# Patient Record
Sex: Female | Born: 1950
Health system: Southern US, Community
[De-identification: ages and names within clinical notes are randomized; demographics above are authoritative.]

## PROBLEM LIST (undated history)

## (undated) ENCOUNTER — Emergency Department: Payer: Medicare HMO

## (undated) DIAGNOSIS — I10 Essential (primary) hypertension: Secondary | ICD-10-CM

## (undated) DIAGNOSIS — M1990 Unspecified osteoarthritis, unspecified site: Secondary | ICD-10-CM

## (undated) DIAGNOSIS — I209 Angina pectoris, unspecified: Secondary | ICD-10-CM

## (undated) DIAGNOSIS — Z8719 Personal history of other diseases of the digestive system: Secondary | ICD-10-CM

## (undated) DIAGNOSIS — E669 Obesity, unspecified: Secondary | ICD-10-CM

## (undated) DIAGNOSIS — R7303 Prediabetes: Secondary | ICD-10-CM

## (undated) DIAGNOSIS — G4730 Sleep apnea, unspecified: Secondary | ICD-10-CM

## (undated) DIAGNOSIS — D649 Anemia, unspecified: Secondary | ICD-10-CM

## (undated) DIAGNOSIS — K219 Gastro-esophageal reflux disease without esophagitis: Secondary | ICD-10-CM

## (undated) DIAGNOSIS — I739 Peripheral vascular disease, unspecified: Secondary | ICD-10-CM

## (undated) DIAGNOSIS — K635 Polyp of colon: Secondary | ICD-10-CM

## (undated) DIAGNOSIS — F32A Depression, unspecified: Secondary | ICD-10-CM

## (undated) DIAGNOSIS — Z8614 Personal history of Methicillin resistant Staphylococcus aureus infection: Secondary | ICD-10-CM

## (undated) DIAGNOSIS — IMO0001 Reserved for inherently not codable concepts without codable children: Secondary | ICD-10-CM

## (undated) DIAGNOSIS — K449 Diaphragmatic hernia without obstruction or gangrene: Secondary | ICD-10-CM

## (undated) DIAGNOSIS — G894 Chronic pain syndrome: Secondary | ICD-10-CM

## (undated) HISTORY — PX: CHOLECYSTECTOMY: SHX55

## (undated) HISTORY — DX: Polyp of colon: K63.5

## (undated) HISTORY — PX: COLONOSCOPY W/ POLYPECTOMY: SHX1380

## (undated) HISTORY — DX: Unspecified osteoarthritis, unspecified site: M19.90

## (undated) HISTORY — PX: JOINT REPLACEMENT: SHX530

## (undated) HISTORY — PX: BREAST SURGERY: SHX581

## (undated) HISTORY — DX: Essential (primary) hypertension: I10

## (undated) HISTORY — PX: CARPAL TUNNEL RELEASE: SHX101

## (undated) HISTORY — PX: ABDOMINAL HYSTERECTOMY: SHX81

## (undated) HISTORY — PX: TRIGGER FINGER RELEASE: SHX641

---

## 2004-09-15 ENCOUNTER — Emergency Department: Payer: Self-pay | Admitting: Emergency Medicine

## 2004-10-01 ENCOUNTER — Emergency Department: Payer: Self-pay | Admitting: Emergency Medicine

## 2005-01-13 ENCOUNTER — Emergency Department: Payer: Self-pay | Admitting: Emergency Medicine

## 2005-10-19 ENCOUNTER — Emergency Department: Payer: Self-pay | Admitting: Emergency Medicine

## 2006-03-21 DIAGNOSIS — D126 Benign neoplasm of colon, unspecified: Secondary | ICD-10-CM | POA: Insufficient documentation

## 2006-09-02 ENCOUNTER — Emergency Department: Payer: Self-pay | Admitting: Emergency Medicine

## 2006-09-02 ENCOUNTER — Other Ambulatory Visit: Payer: Self-pay

## 2006-09-23 ENCOUNTER — Ambulatory Visit: Payer: Self-pay

## 2006-10-03 ENCOUNTER — Ambulatory Visit: Payer: Self-pay | Admitting: Gastroenterology

## 2007-05-23 ENCOUNTER — Emergency Department: Payer: Self-pay | Admitting: Emergency Medicine

## 2007-05-23 ENCOUNTER — Other Ambulatory Visit: Payer: Self-pay

## 2007-05-27 ENCOUNTER — Ambulatory Visit: Payer: Self-pay | Admitting: Emergency Medicine

## 2007-09-07 ENCOUNTER — Encounter: Payer: Self-pay | Admitting: Neurosurgery

## 2007-09-14 ENCOUNTER — Encounter: Payer: Self-pay | Admitting: Neurosurgery

## 2007-10-15 ENCOUNTER — Encounter: Payer: Self-pay | Admitting: Neurosurgery

## 2007-11-15 ENCOUNTER — Encounter: Payer: Self-pay | Admitting: Neurosurgery

## 2008-03-01 ENCOUNTER — Ambulatory Visit: Payer: Self-pay

## 2008-03-03 ENCOUNTER — Ambulatory Visit: Payer: Self-pay | Admitting: Unknown Physician Specialty

## 2010-12-20 ENCOUNTER — Observation Stay: Payer: Self-pay | Admitting: Internal Medicine

## 2011-01-03 ENCOUNTER — Emergency Department: Payer: Self-pay | Admitting: Emergency Medicine

## 2011-01-28 ENCOUNTER — Ambulatory Visit: Payer: Self-pay | Admitting: Unknown Physician Specialty

## 2011-01-30 LAB — PATHOLOGY REPORT

## 2011-05-27 ENCOUNTER — Ambulatory Visit: Payer: Self-pay | Admitting: Unknown Physician Specialty

## 2011-05-27 DIAGNOSIS — I10 Essential (primary) hypertension: Secondary | ICD-10-CM

## 2011-06-06 ENCOUNTER — Inpatient Hospital Stay: Payer: Self-pay | Admitting: Unknown Physician Specialty

## 2011-06-11 ENCOUNTER — Ambulatory Visit: Payer: Self-pay | Admitting: Surgery

## 2011-09-13 ENCOUNTER — Emergency Department: Payer: Self-pay | Admitting: Internal Medicine

## 2011-12-08 ENCOUNTER — Emergency Department: Payer: Self-pay | Admitting: Emergency Medicine

## 2012-01-03 ENCOUNTER — Ambulatory Visit: Payer: Self-pay | Admitting: Anesthesiology

## 2012-01-03 LAB — BASIC METABOLIC PANEL
Anion Gap: 8 (ref 7–16)
Calcium, Total: 9.1 mg/dL (ref 8.5–10.1)
Chloride: 103 mmol/L (ref 98–107)
Co2: 30 mmol/L (ref 21–32)
Osmolality: 279 (ref 275–301)
Potassium: 3.3 mmol/L — ABNORMAL LOW (ref 3.5–5.1)

## 2012-01-08 ENCOUNTER — Ambulatory Visit: Payer: Self-pay | Admitting: Unknown Physician Specialty

## 2012-01-08 LAB — POTASSIUM: Potassium: 3.8 mmol/L (ref 3.5–5.1)

## 2012-01-31 ENCOUNTER — Ambulatory Visit: Payer: 59 | Admitting: Internal Medicine

## 2012-03-02 ENCOUNTER — Ambulatory Visit: Payer: 59 | Admitting: Internal Medicine

## 2012-04-20 ENCOUNTER — Ambulatory Visit: Payer: Self-pay | Admitting: Unknown Physician Specialty

## 2012-04-27 ENCOUNTER — Ambulatory Visit: Payer: Self-pay | Admitting: Unknown Physician Specialty

## 2012-05-25 ENCOUNTER — Ambulatory Visit: Payer: Self-pay | Admitting: Surgery

## 2012-05-25 LAB — POTASSIUM: Potassium: 4.2 mmol/L

## 2012-10-12 ENCOUNTER — Ambulatory Visit: Payer: Self-pay | Admitting: Family Medicine

## 2012-12-09 ENCOUNTER — Emergency Department: Payer: Self-pay | Admitting: Emergency Medicine

## 2012-12-09 LAB — URINALYSIS, COMPLETE
Bacteria: NONE SEEN
Bilirubin,UR: NEGATIVE
Blood: NEGATIVE
Glucose,UR: NEGATIVE mg/dL (ref 0–75)
Ketone: NEGATIVE
Ph: 6 (ref 4.5–8.0)
RBC,UR: 1 /HPF (ref 0–5)
Specific Gravity: 1.008 (ref 1.003–1.030)

## 2013-02-24 ENCOUNTER — Emergency Department: Payer: Self-pay | Admitting: Internal Medicine

## 2013-02-24 LAB — URINALYSIS, COMPLETE
Bilirubin,UR: NEGATIVE
Blood: NEGATIVE
Leukocyte Esterase: NEGATIVE
Nitrite: NEGATIVE
Specific Gravity: 1.026 (ref 1.003–1.030)
WBC UR: 1 /HPF (ref 0–5)

## 2013-02-24 LAB — COMPREHENSIVE METABOLIC PANEL
Albumin: 3.6 g/dL (ref 3.4–5.0)
Alkaline Phosphatase: 121 U/L (ref 50–136)
BUN: 16 mg/dL (ref 7–18)
Bilirubin,Total: 0.3 mg/dL (ref 0.2–1.0)
Calcium, Total: 9.7 mg/dL (ref 8.5–10.1)
Chloride: 108 mmol/L — ABNORMAL HIGH (ref 98–107)
Co2: 29 mmol/L (ref 21–32)
EGFR (Non-African Amer.): 43 — ABNORMAL LOW
Osmolality: 285 (ref 275–301)
Potassium: 3.6 mmol/L (ref 3.5–5.1)
SGPT (ALT): 13 U/L (ref 12–78)
Total Protein: 7.2 g/dL (ref 6.4–8.2)

## 2013-02-24 LAB — LIPASE, BLOOD: Lipase: 100 U/L (ref 73–393)

## 2013-02-24 LAB — CBC
HGB: 11.9 g/dL — ABNORMAL LOW (ref 12.0–16.0)
MCH: 26.8 pg (ref 26.0–34.0)
Platelet: 225 10*3/uL (ref 150–440)
RDW: 14.4 % (ref 11.5–14.5)
WBC: 6.3 10*3/uL (ref 3.6–11.0)

## 2013-02-27 ENCOUNTER — Emergency Department: Payer: Self-pay | Admitting: Emergency Medicine

## 2013-03-17 ENCOUNTER — Ambulatory Visit: Payer: Self-pay | Admitting: Family Medicine

## 2013-04-12 ENCOUNTER — Ambulatory Visit: Payer: Self-pay | Admitting: Family Medicine

## 2013-06-21 ENCOUNTER — Ambulatory Visit: Payer: Self-pay | Admitting: Anesthesiology

## 2013-06-22 ENCOUNTER — Ambulatory Visit: Payer: Self-pay | Admitting: Anesthesiology

## 2013-06-25 ENCOUNTER — Encounter: Payer: Self-pay | Admitting: Anesthesiology

## 2013-07-27 ENCOUNTER — Ambulatory Visit: Payer: Self-pay | Admitting: Anesthesiology

## 2013-08-25 ENCOUNTER — Ambulatory Visit: Payer: Self-pay | Admitting: Anesthesiology

## 2013-09-20 ENCOUNTER — Emergency Department: Payer: Self-pay | Admitting: Emergency Medicine

## 2013-09-20 DIAGNOSIS — M1710 Unilateral primary osteoarthritis, unspecified knee: Secondary | ICD-10-CM | POA: Insufficient documentation

## 2013-09-20 DIAGNOSIS — K219 Gastro-esophageal reflux disease without esophagitis: Secondary | ICD-10-CM | POA: Insufficient documentation

## 2013-09-20 DIAGNOSIS — M179 Osteoarthritis of knee, unspecified: Secondary | ICD-10-CM | POA: Insufficient documentation

## 2013-09-20 DIAGNOSIS — G8929 Other chronic pain: Secondary | ICD-10-CM | POA: Insufficient documentation

## 2013-09-20 DIAGNOSIS — I10 Essential (primary) hypertension: Secondary | ICD-10-CM | POA: Insufficient documentation

## 2013-09-23 ENCOUNTER — Ambulatory Visit: Payer: Self-pay | Admitting: Anesthesiology

## 2013-10-21 ENCOUNTER — Emergency Department: Payer: Self-pay | Admitting: Emergency Medicine

## 2013-10-21 LAB — BASIC METABOLIC PANEL
Anion Gap: 4 — ABNORMAL LOW (ref 7–16)
BUN: 12 mg/dL (ref 7–18)
CHLORIDE: 103 mmol/L (ref 98–107)
Calcium, Total: 9.3 mg/dL (ref 8.5–10.1)
Co2: 32 mmol/L (ref 21–32)
Creatinine: 1.17 mg/dL (ref 0.60–1.30)
EGFR (African American): 58 — ABNORMAL LOW
GFR CALC NON AF AMER: 50 — AB
Glucose: 86 mg/dL (ref 65–99)
Osmolality: 277 (ref 275–301)
Potassium: 2.9 mmol/L — ABNORMAL LOW (ref 3.5–5.1)
SODIUM: 139 mmol/L (ref 136–145)

## 2013-10-21 LAB — CBC WITH DIFFERENTIAL/PLATELET
Basophil #: 0.1 10*3/uL (ref 0.0–0.1)
Basophil %: 1.1 %
Eosinophil #: 0.2 10*3/uL (ref 0.0–0.7)
Eosinophil %: 3.2 %
HCT: 35.3 % (ref 35.0–47.0)
HGB: 11.8 g/dL — AB (ref 12.0–16.0)
LYMPHS PCT: 40.5 %
Lymphocyte #: 2.1 10*3/uL (ref 1.0–3.6)
MCH: 27.8 pg (ref 26.0–34.0)
MCHC: 33.5 g/dL (ref 32.0–36.0)
MCV: 83 fL (ref 80–100)
MONOS PCT: 7.5 %
Monocyte #: 0.4 x10 3/mm (ref 0.2–0.9)
NEUTROS PCT: 47.7 %
Neutrophil #: 2.4 10*3/uL (ref 1.4–6.5)
Platelet: 284 10*3/uL (ref 150–440)
RBC: 4.25 10*6/uL (ref 3.80–5.20)
RDW: 15 % — AB (ref 11.5–14.5)
WBC: 5.1 10*3/uL (ref 3.6–11.0)

## 2013-10-21 LAB — URINALYSIS, COMPLETE
BACTERIA: NONE SEEN
BILIRUBIN, UR: NEGATIVE
Blood: NEGATIVE
Glucose,UR: NEGATIVE mg/dL (ref 0–75)
Ketone: NEGATIVE
LEUKOCYTE ESTERASE: NEGATIVE
Nitrite: NEGATIVE
Ph: 5 (ref 4.5–8.0)
Protein: NEGATIVE
RBC,UR: NONE SEEN /HPF (ref 0–5)
Specific Gravity: 1.015 (ref 1.003–1.030)

## 2013-10-21 LAB — TROPONIN I: Troponin-I: <0.02 ng/mL

## 2014-04-06 ENCOUNTER — Ambulatory Visit: Payer: Self-pay | Admitting: Pain Medicine

## 2014-04-06 ENCOUNTER — Other Ambulatory Visit: Payer: Self-pay | Admitting: Pain Medicine

## 2014-04-06 LAB — BASIC METABOLIC PANEL
Anion Gap: 4 — ABNORMAL LOW (ref 7–16)
BUN: 15 mg/dL (ref 7–18)
Calcium, Total: 9.7 mg/dL (ref 8.5–10.1)
Chloride: 105 mmol/L (ref 98–107)
Co2: 32 mmol/L (ref 21–32)
Creatinine: 0.86 mg/dL (ref 0.60–1.30)
EGFR (African American): >60
EGFR (Non-African Amer.): >60
GLUCOSE: 81 mg/dL (ref 65–99)
Osmolality: 281 (ref 275–301)
Potassium: 3.4 mmol/L — ABNORMAL LOW (ref 3.5–5.1)
SODIUM: 141 mmol/L (ref 136–145)

## 2014-04-06 LAB — HEPATIC FUNCTION PANEL A (ARMC)
ALK PHOS: 113 U/L
Albumin: 3.9 g/dL (ref 3.4–5.0)
BILIRUBIN DIRECT: 0.1 mg/dL (ref 0.00–0.20)
BILIRUBIN TOTAL: 0.4 mg/dL (ref 0.2–1.0)
SGOT(AST): 10 U/L — ABNORMAL LOW (ref 15–37)
SGPT (ALT): 12 U/L (ref 12–78)
Total Protein: 8.1 g/dL (ref 6.4–8.2)

## 2014-04-06 LAB — SEDIMENTATION RATE: Erythrocyte Sed Rate: 42 mm/hr — ABNORMAL HIGH (ref 0–30)

## 2014-04-06 LAB — MAGNESIUM: Magnesium: 1.9 mg/dL

## 2014-04-12 ENCOUNTER — Ambulatory Visit: Payer: Self-pay | Admitting: Pain Medicine

## 2014-05-02 ENCOUNTER — Ambulatory Visit: Payer: Self-pay | Admitting: Pain Medicine

## 2014-05-03 ENCOUNTER — Ambulatory Visit: Payer: Self-pay | Admitting: Pain Medicine

## 2014-05-16 ENCOUNTER — Emergency Department: Payer: Self-pay | Admitting: Emergency Medicine

## 2014-05-16 LAB — TROPONIN I

## 2014-05-23 ENCOUNTER — Encounter: Payer: Self-pay | Admitting: Pain Medicine

## 2014-06-09 ENCOUNTER — Inpatient Hospital Stay: Payer: Self-pay | Admitting: Internal Medicine

## 2014-06-09 LAB — SEDIMENTATION RATE: Erythrocyte Sed Rate: 31 mm/hr — ABNORMAL HIGH (ref 0–30)

## 2014-06-09 LAB — TROPONIN I
Troponin-I: <0.02 ng/mL
Troponin-I: <0.02 ng/mL

## 2014-06-09 LAB — COMPREHENSIVE METABOLIC PANEL
ALBUMIN: 3.2 g/dL — AB (ref 3.4–5.0)
ALK PHOS: 101 U/L
ANION GAP: 7 (ref 7–16)
BILIRUBIN TOTAL: 0.3 mg/dL (ref 0.2–1.0)
BUN: 21 mg/dL — ABNORMAL HIGH (ref 7–18)
Calcium, Total: 9.2 mg/dL (ref 8.5–10.1)
Chloride: 107 mmol/L (ref 98–107)
Co2: 30 mmol/L (ref 21–32)
Creatinine: 1.02 mg/dL (ref 0.60–1.30)
EGFR (African American): >60
EGFR (Non-African Amer.): 59 — ABNORMAL LOW
GLUCOSE: 81 mg/dL (ref 65–99)
Osmolality: 289 (ref 275–301)
POTASSIUM: 3.4 mmol/L — AB (ref 3.5–5.1)
SGOT(AST): 16 U/L (ref 15–37)
SGPT (ALT): 13 U/L — ABNORMAL LOW
Sodium: 144 mmol/L (ref 136–145)
Total Protein: 7.3 g/dL (ref 6.4–8.2)

## 2014-06-09 LAB — URINALYSIS, COMPLETE
BACTERIA: NONE SEEN
BILIRUBIN, UR: NEGATIVE
BLOOD: NEGATIVE
Glucose,UR: NEGATIVE mg/dL (ref 0–75)
Ketone: NEGATIVE
NITRITE: NEGATIVE
Ph: 5 (ref 4.5–8.0)
Protein: NEGATIVE
RBC,UR: 3 /HPF (ref 0–5)
SPECIFIC GRAVITY: 1.023 (ref 1.003–1.030)
Squamous Epithelial: 2
WBC UR: 7 /HPF (ref 0–5)

## 2014-06-09 LAB — CBC WITH DIFFERENTIAL/PLATELET
Basophil #: 0.1 10*3/uL (ref 0.0–0.1)
Basophil %: 1.4 %
EOS PCT: 1.7 %
Eosinophil #: 0.1 10*3/uL (ref 0.0–0.7)
HCT: 38.5 % (ref 35.0–47.0)
HGB: 12 g/dL (ref 12.0–16.0)
Lymphocyte #: 2 10*3/uL (ref 1.0–3.6)
Lymphocyte %: 35.3 %
MCH: 26.9 pg (ref 26.0–34.0)
MCHC: 31 g/dL — AB (ref 32.0–36.0)
MCV: 87 fL (ref 80–100)
MONO ABS: 0.5 x10 3/mm (ref 0.2–0.9)
Monocyte %: 8.8 %
NEUTROS PCT: 52.8 %
Neutrophil #: 3 10*3/uL (ref 1.4–6.5)
Platelet: 248 10*3/uL (ref 150–440)
RBC: 4.45 10*6/uL (ref 3.80–5.20)
RDW: 15.1 % — ABNORMAL HIGH (ref 11.5–14.5)
WBC: 5.6 10*3/uL (ref 3.6–11.0)

## 2014-06-09 LAB — HEMOGLOBIN A1C: HEMOGLOBIN A1C: 5.4 % (ref 4.2–6.3)

## 2014-06-09 LAB — TSH: Thyroid Stimulating Horm: 0.748 u[IU]/mL

## 2014-06-09 LAB — MAGNESIUM: Magnesium: 2.1 mg/dL

## 2014-06-10 DIAGNOSIS — I359 Nonrheumatic aortic valve disorder, unspecified: Secondary | ICD-10-CM

## 2014-06-10 LAB — CBC WITH DIFFERENTIAL/PLATELET
BASOS ABS: 0 10*3/uL (ref 0.0–0.1)
BASOS PCT: 1 %
EOS PCT: 2.1 %
Eosinophil #: 0.1 10*3/uL (ref 0.0–0.7)
HCT: 33.5 % — AB (ref 35.0–47.0)
HGB: 11.1 g/dL — ABNORMAL LOW (ref 12.0–16.0)
LYMPHS ABS: 1.7 10*3/uL (ref 1.0–3.6)
LYMPHS PCT: 37.9 %
MCH: 28.2 pg (ref 26.0–34.0)
MCHC: 33 g/dL (ref 32.0–36.0)
MCV: 86 fL (ref 80–100)
MONOS PCT: 9.3 %
Monocyte #: 0.4 x10 3/mm (ref 0.2–0.9)
NEUTROS ABS: 2.2 10*3/uL (ref 1.4–6.5)
Neutrophil %: 49.7 %
Platelet: 219 10*3/uL (ref 150–440)
RBC: 3.91 10*6/uL (ref 3.80–5.20)
RDW: 14.9 % — ABNORMAL HIGH (ref 11.5–14.5)
WBC: 4.5 10*3/uL (ref 3.6–11.0)

## 2014-06-10 LAB — COMPREHENSIVE METABOLIC PANEL
ALT: 9 U/L — AB
ANION GAP: 5 — AB (ref 7–16)
AST: 15 U/L (ref 15–37)
Albumin: 2.6 g/dL — ABNORMAL LOW (ref 3.4–5.0)
Alkaline Phosphatase: 79 U/L
BUN: 20 mg/dL — ABNORMAL HIGH (ref 7–18)
Bilirubin,Total: 0.2 mg/dL (ref 0.2–1.0)
CALCIUM: 8.4 mg/dL — AB (ref 8.5–10.1)
CHLORIDE: 114 mmol/L — AB (ref 98–107)
CO2: 28 mmol/L (ref 21–32)
Creatinine: 0.92 mg/dL (ref 0.60–1.30)
EGFR (African American): >60
Glucose: 95 mg/dL (ref 65–99)
Osmolality: 295 (ref 275–301)
POTASSIUM: 4 mmol/L (ref 3.5–5.1)
Sodium: 147 mmol/L — ABNORMAL HIGH (ref 136–145)
Total Protein: 5.9 g/dL — ABNORMAL LOW (ref 6.4–8.2)

## 2014-06-10 LAB — LIPID PANEL
Cholesterol: 135 mg/dL (ref 0–200)
HDL Cholesterol: 36 mg/dL — ABNORMAL LOW (ref 40–60)
LDL CHOLESTEROL, CALC: 86 mg/dL (ref 0–100)
Triglycerides: 63 mg/dL (ref 0–200)
VLDL CHOLESTEROL, CALC: 13 mg/dL (ref 5–40)

## 2014-06-12 LAB — URINE CULTURE

## 2014-07-05 DIAGNOSIS — M1990 Unspecified osteoarthritis, unspecified site: Secondary | ICD-10-CM | POA: Insufficient documentation

## 2014-07-16 ENCOUNTER — Ambulatory Visit: Payer: Self-pay | Admitting: Neurology

## 2014-08-02 DIAGNOSIS — Q283 Other malformations of cerebral vessels: Secondary | ICD-10-CM | POA: Insufficient documentation

## 2014-08-03 DIAGNOSIS — M5030 Other cervical disc degeneration, unspecified cervical region: Secondary | ICD-10-CM | POA: Insufficient documentation

## 2014-08-03 DIAGNOSIS — M542 Cervicalgia: Secondary | ICD-10-CM | POA: Insufficient documentation

## 2014-08-03 DIAGNOSIS — M5136 Other intervertebral disc degeneration, lumbar region: Secondary | ICD-10-CM | POA: Insufficient documentation

## 2014-08-03 DIAGNOSIS — M310 Hypersensitivity angiitis: Secondary | ICD-10-CM | POA: Insufficient documentation

## 2015-01-13 ENCOUNTER — Ambulatory Visit
Admit: 2015-01-13 | Disposition: A | Discharge status: Home / Self Care | Payer: Self-pay | Attending: Unknown Physician Specialty | Admitting: Unknown Physician Specialty

## 2015-01-30 ENCOUNTER — Emergency Department: Admit: 2015-01-30 | Disposition: A | Discharge status: Home / Self Care | Payer: Self-pay | Admitting: Student

## 2015-01-31 NOTE — Op Note (Signed)
PATIENT NAME:  Jacqueline Delgado, Jacqueline Delgado MR#:  [id number] DATE OF BIRTH:  15-Apr-1951  DATE OF PROCEDURE:  05/25/2012  PREOPERATIVE DIAGNOSIS: Chronic cholecystitis, cholelithiasis.   POSTOPERATIVE DIAGNOSIS: Chronic cholecystitis, cholelithiasis.   PROCEDURE: Laparoscopic cholecystectomy.   SURGEON: Rochel Brome, M.D.   ANESTHESIA: General.   INDICATIONS: This 64 year old female has a history of epigastric pain and ultrasound findings of gallstones. Surgery was recommended for definitive treatment.   DESCRIPTION OF PROCEDURE: The patient was placed on the operating table in the supine position under general endotracheal anesthesia. It is noted she did have a preop dose of Invanz due to her history of total knee replacement. The abdomen was prepared with ChloraPrep and draped in a sterile manner.   An incision was made deep in the inferior aspect of the umbilicus and carried down to the deep fascia, which was grasped with a laryngeal hook and elevated. A Veress needle was inserted, aspirated, and irrigated with a saline solution. Next, the peritoneal cavity was inflated with carbon dioxide. The Veress needle was removed. The 10-mm cannula was inserted. The 10-mm, 0-degree laparoscope was inserted to view the peritoneal cavity. There were some adhesions between the omentum and the anterior abdominal wall, and at this point could not see the right lobe of the liver and could not see the gallbladder. The scope was manipulated briefly, but still could not see the right upper quadrant. Another short incision was made in the left mid abdomen to introduce a 5-mm cannula. A scissors was introduced and did some blunt and sharp dissection, separating a portion of omentum from the falciform ligament and from the anterior abdominal wall. We made a small window for the laparoscope to pass through into the right upper quadrant. That 5-mm port was not needed after the dissection. Next, another incision was made in the  epigastrium just to the right of the midline to introduce an 11-mm cannula. Two incisions were made in the lateral aspect of the right upper quadrant to introduce two 5-mm cannulas.   The patient was placed in the reverse Trendelenburg position and turned some 5 degrees to the left. The gallbladder appeared to have a slightly thickened wall. The liver appeared smooth. The gallbladder was retracted towards the right shoulder. The infundibulum was retracted inferiorly and laterally. The porta hepatis was demonstrated. The neck of the gallbladder was mobilized with incision of the visceral peritoneum. The cystic duct was dissected free from surrounding structures. The cystic artery was dissected free from surrounding structures. A critical view of safety was demonstrated. An endoclip was placed across the cystic duct. An incision was made in the cystic duct. There was just a tiny bit of clear bile, which drained. A Reddick catheter was inserted; however, it would only thread in about 5 mm. It appeared that the cystic duct was very small in size and therefore a cholangiogram was not done. Next, the Reddick catheter was removed. The cystic duct was doubly ligated with endoclips and divided. The cystic artery was controlled with double endoclips and divided. The gallbladder was dissected free from the liver with hook and cautery. Bleeding was very scant. A few small bleeding points were cauterized. Hemostasis was intact. The gallbladder was delivered up through the infraumbilical incision, opened, suctioned, removed, and sent with palpable gallstone for routine pathology. The right upper quadrant was further inspected and hemostasis was intact. All the cannulas were removed, allowing carbon dioxide to escape from the peritoneal cavity. The skin incisions were closed with interrupted 5-0 chromic  subcuticular sutures, benzoin, and Steri-Strips. Dressings were applied with paper tape. The patient tolerated surgery  satisfactorily and was then prepared for transfer to the recovery room.   ____________________________ Lenna Sciara. Rochel Brome, MD [id number]:bjt D: 05/25/2012 09:04:18 ET T: 05/25/2012 12:39:39 ET JOB#: [id number]  cc: Loreli Dollar, MD, <Dictator> Loreli Dollar MD ELECTRONICALLY SIGNED 05/27/2012 12:34

## 2015-02-02 ENCOUNTER — Emergency Department
Admit: 2015-02-02 | Disposition: A | Discharge status: Home / Self Care | Payer: Self-pay | Admitting: Emergency Medicine

## 2015-02-03 NOTE — H&P (Signed)
PATIENT NAME:  Jacqueline Delgado, Jacqueline Delgado MR#:  [id number] DATE OF BIRTH:  [birth year]  DATE OF ADMISSION:  06/21/2013  CHIEF COMPLAINT: Persistent low back pain and right lateral leg numbness and numbness of  the right great toe.  PROCEDURE: None.   HISTORY OF PRESENT ILLNESS: Jacqueline Delgado is a pleasant 64 year old black female with long-standing history of low back pain that started about March of 2014. It is mainly centralized low back pain that has gradually gotten worse to the Jacqueline Delgado where she sought medical attention with a maximum VAS score of 10, best a 5. The pain does not appear to be influenced by time of day. There has been some mild increased intensity at night and when she gets out of bed in the morning. Aggravating factors include twisting of the low back as well as prolonged sitting and bending. She describes the pain as a right lateral leg numbness with pain in the low back that does not let her sleep at night. It is aching, annoying, constant, burning and uncomfortable. Previous MRI scan is available for review dated 04/12/2013 showing multilevel degenerative disk disease most notably at L3-L4, L4-L5 and L5-S1. At L3-L4 there is mild annular bulge with mild to moderate left neural foraminal encroachment with moderate narrowing of the right neural foramen and facet hypertrophy bilaterally. This is present at L4-L5 as well and at L5-S1 there is an annular disk bulge with mild encroachment of the right neural foramen.   PAST MEDICAL HISTORY: Significant for hypertension, sleep apnea, hiatal hernia, reflux and osteoarthritis.   SOCIAL HISTORY: She is widowed. Has 4 children. Does not smoke. Does not drink alcohol.   WORK HISTORY: Full-time work at [employer] and part-time work at [employer] a [employer].   PAST SURGICAL HISTORY: Bilateral knee replacement and cholecystectomy.   ALLERGIES: IBUPROFEN.   CURRENT MEDICATIONS: Include AcipHex, KCL, torsemide, zolpidem.   PHYSICAL EXAMINATION:  Reveals a pleasant black female in no acute distress. She is alert and oriented x 3, cooperative and compliant. VAS is 0/10. Temperature is 98, blood pressure 124/74, pulse 67 and regular, respirations 14, and O2 sat 98%. Heart has regular rate and rhythm. Lungs are clear to auscultation bilaterally without wheeze or rhonchi. Inspection of the low back reveals some paraspinous muscle tenderness primarily at L5, bilateral over the SI joints. She has pain on extension with pain on left lateral rotation in the standing position. Not so much with right lateral rotation. In the supine position, she has a positive straight leg raise at approximately 20 to 30 degrees on the right side, negative on the left. Her muscle strength and bulk and tone is good, both proximal and distal, and would rate the strength at 5/5. There is diminished sensation over the dorsum of the right foot and lateral right leg.   ASSESSMENT: 1.  Multilevel degenerative disk disease with L5 radicular symptoms, worse on the right than left.  2.  Facet arthropathy bilaterally, worse on the left than right.  3.  Myofascial low back pain.  4.  Obesity.   PLAN: 1.  I have had a long discussion with the patient regarding her care, and I think she would be a candidate for epidural steroid injection. We have gone over the risks and benefits of the procedure. All questions answered. We will have her return to clinic next available date.  2.  She should continue her weight loss.  3.  Continue current medication management.  4.  Continue efforts  at physical therapy strengthening and stretching as well as aerobic conditioning. ____________________________ Alvina Filbert. Andree Elk, MD jga:sb D: 06/21/2013 16:23:02 ET T: 06/21/2013 16:37:09 ET JOB#: [id number]  cc: Alvina Filbert. Andree Elk, MD, <Dictator> Ashok Norris, MD Alvina Filbert [place] MD ELECTRONICALLY SIGNED 06/27/2013 11:32
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
## 2015-02-05 NOTE — Op Note (Signed)
PATIENT NAME:  Jacqueline Delgado, Jacqueline Delgado MR#:  [id number] DATE OF BIRTH:  08-11-1951  DATE OF PROCEDURE:  01/08/2012  PREOPERATIVE DIAGNOSIS: Carpal tunnel syndrome, right wrist.   POSTOPERATIVE DIAGNOSIS: Carpal tunnel syndrome, right wrist.   PROCEDURE PERFORMED: Open carpal tunnel release on the right.   SURGEON: Kathrene Alu., M.D.   ANESTHESIA: General.   HISTORY: The patient had a long history of carpal tunnel syndrome referable to her right wrist. Median nerve compression had been documented with nerve conduction studies. She was ultimately brought in for an open carpal tunnel release due to her persistent symptoms.   DESCRIPTION OF PROCEDURE: The patient was taken to the operating room where satisfactory general anesthesia was achieved. A tourniquet was applied to her right upper arm. The right upper extremity was prepped and draped in the usual fashion for a procedure about the wrist. Next, the right upper extremity was exsanguinated and the tourniquet was inflated. A slightly curved incision was made over the volar aspect of the distal half of the transverse carpal ligament. Dissection was carried down to the palmar fascia. The distal edge of the transverse carpal ligament was identified. A hemostat was inserted underneath the ligament to protect the median nerve and then the ligament was divided in its entirety. The distal half was divided with a knife and the proximal half with scissors. The nerve was inspected. It was slightly flattened underneath the ligament. No mass was appreciated within the carpal tunnel and there was no evidence of proliferative synovitis.   The tourniquet was released at this time. It was up about 12 minutes.   The wound was irrigated with GU irrigant. Bleeding was controlled with digital pressure. I did infiltrate the wound with about 6 mL of 0.5% Marcaine without epinephrine.   I closed the skin with 4-0 nylon suture in vertical mattress fashion. Betadine was  applied followed by a sterile dressing and a fiberglass volar splint. The wrist was slightly extended in the splint.   The patient was awakened and transferred to her stretcher bed. She was taken to the recovery room in satisfactory condition. Blood loss was negligible. ____________________________ Kathrene Alu., MD hbk:slb D: 01/08/2012 10:16:25 ET T: 01/08/2012 11:19:07 ET JOB#: [id number]  cc: Kathrene Alu., MD, <Dictator> Vilinda Flake, JR MD ELECTRONICALLY SIGNED 02/02/2012 21:23

## 2015-02-10 ENCOUNTER — Other Ambulatory Visit: Payer: Self-pay | Admitting: Ophthalmology

## 2015-02-10 DIAGNOSIS — H469 Unspecified optic neuritis: Secondary | ICD-10-CM

## 2015-02-10 DIAGNOSIS — H543 Unqualified visual loss, both eyes: Secondary | ICD-10-CM

## 2015-02-17 ENCOUNTER — Other Ambulatory Visit: Payer: Self-pay

## 2015-02-17 ENCOUNTER — Ambulatory Visit: Admission: RE | Admit: 2015-02-17 | Payer: Self-pay | Source: Ambulatory Visit

## 2015-02-22 DIAGNOSIS — M1712 Unilateral primary osteoarthritis, left knee: Secondary | ICD-10-CM | POA: Insufficient documentation

## 2015-02-28 ENCOUNTER — Encounter: Payer: Self-pay | Admitting: Physical Therapy

## 2015-02-28 ENCOUNTER — Ambulatory Visit: Payer: 59 | Attending: Family Medicine | Admitting: Physical Therapy

## 2015-02-28 DIAGNOSIS — R293 Abnormal posture: Secondary | ICD-10-CM | POA: Insufficient documentation

## 2015-02-28 DIAGNOSIS — M542 Cervicalgia: Secondary | ICD-10-CM | POA: Diagnosis not present

## 2015-02-28 DIAGNOSIS — M62838 Other muscle spasm: Secondary | ICD-10-CM | POA: Diagnosis present

## 2015-02-28 NOTE — Patient Instructions (Signed)
  Copyright  VHI. All rights reserved.  AROM: Neck Flexion   Bend head forward. Hold __2__ seconds. Repeat _10___ times per set. Do _2___ sets per session. Do _2___ sessions per day.  http://orth.exer.us/299   Copyright  VHI. All rights reserved.  AROM: Neck Rotation   Turn head slowly to look over one shoulder, then the other. Hold each position ____ seconds. Repeat _10___ times per set. Do 2____ sets per session. Do _2___ sessions per day.  http://orth.exer.us/295   Copyright  VHI. All rights reserved.  Neck: Retraction   Sit with back straight or lie on firm surface. Place hands, with fingers locked, behind head. (or have head against pillow if laying down or against wall if sitting) Keep head in neutral position. Push back with head while resisting with hands. (do chin tuck)Do not let head actually move. Hold __3__ seconds. Repeat _10___ times. Do _2___ sessions per day. CAUTION: Pressure should be steady.  Copyright  VHI. All rights reserved.
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
## 2015-03-06 ENCOUNTER — Encounter: Payer: 59 | Admitting: Physical Therapy

## 2015-03-08 ENCOUNTER — Ambulatory Visit: Payer: 59 | Admitting: Physical Therapy

## 2015-03-08 ENCOUNTER — Encounter: Payer: Self-pay | Admitting: Physical Therapy

## 2015-03-08 DIAGNOSIS — M542 Cervicalgia: Secondary | ICD-10-CM

## 2015-03-08 DIAGNOSIS — M62838 Other muscle spasm: Secondary | ICD-10-CM | POA: Diagnosis not present

## 2015-03-08 DIAGNOSIS — R293 Abnormal posture: Secondary | ICD-10-CM
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
## 2015-03-08 NOTE — Patient Instructions (Signed)
   Copyright  VHI. All rights reserved.  Shoulder Retraction   With red band around door knob, Stand up straight With elbows bent, squeeze shoulders back, squeezing shoulder blades (be sure to keep shoulders down); Repeat 2 sets of 10; Copyright  VHI. All rights reserved.  Shoulder Extension (Sit Box)   Standing with red band around doorknob, try to stand up straight and keeping elbow straight, pull band straight back, squeezing shoulder blades Be sure to keep shoulders down if possible. Repeat 2 sets of 10   Copyright  VHI. All rights reserved.  Copyright  VHI. All rights reserved.  Reverse Fly / Shoulder Retraction   Holding band in both hands, Pull arms out to side in "T" position keeping elbows straight, repeat 2 sets of 10;  Copyright  VHI. All rights reserved.   Roll   Inhale and bring shoulders up, back, then exhale and relax shoulders down. Repeat _10__ times. Do _2-3__ times per day.  Copyright  VHI. All rights reserved.

## 2015-03-14 ENCOUNTER — Encounter: Payer: 59 | Admitting: Physical Therapy

## 2015-03-16 ENCOUNTER — Encounter: Payer: 59 | Admitting: Physical Therapy

## 2015-03-20 ENCOUNTER — Encounter: Payer: Self-pay | Admitting: Physical Therapy

## 2015-03-20 ENCOUNTER — Ambulatory Visit: Payer: 59 | Attending: Family Medicine | Admitting: Physical Therapy

## 2015-03-20 DIAGNOSIS — M62838 Other muscle spasm: Secondary | ICD-10-CM | POA: Insufficient documentation

## 2015-03-20 DIAGNOSIS — M542 Cervicalgia: Secondary | ICD-10-CM

## 2015-03-20 DIAGNOSIS — R293 Abnormal posture: Secondary | ICD-10-CM
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
## 2015-03-22 ENCOUNTER — Encounter: Payer: 59 | Admitting: Physical Therapy

## 2015-03-27 ENCOUNTER — Encounter: Payer: 59 | Admitting: Physical Therapy

## 2015-03-29 ENCOUNTER — Encounter: Payer: 59 | Admitting: Physical Therapy

## 2015-03-31 ENCOUNTER — Ambulatory Visit: Payer: 59 | Admitting: Physical Therapy

## 2015-04-28 ENCOUNTER — Emergency Department
Admission: EM | Admit: 2015-04-28 | Discharge: 2015-04-28 | Disposition: A | Discharge status: Home / Self Care | Payer: 59 | Attending: Emergency Medicine | Admitting: Emergency Medicine

## 2015-04-28 ENCOUNTER — Emergency Department: Payer: 59

## 2015-04-28 ENCOUNTER — Encounter: Payer: Self-pay | Admitting: Emergency Medicine

## 2015-04-28 DIAGNOSIS — Y9389 Activity, other specified: Secondary | ICD-10-CM | POA: Diagnosis not present

## 2015-04-28 DIAGNOSIS — Y998 Other external cause status: Secondary | ICD-10-CM | POA: Insufficient documentation

## 2015-04-28 DIAGNOSIS — S62346A Nondisplaced fracture of base of fifth metacarpal bone, right hand, initial encounter for closed fracture: Secondary | ICD-10-CM | POA: Insufficient documentation

## 2015-04-28 DIAGNOSIS — Z79899 Other long term (current) drug therapy: Secondary | ICD-10-CM | POA: Insufficient documentation

## 2015-04-28 DIAGNOSIS — I10 Essential (primary) hypertension: Secondary | ICD-10-CM | POA: Insufficient documentation

## 2015-04-28 DIAGNOSIS — Y92512 Supermarket, store or market as the place of occurrence of the external cause: Secondary | ICD-10-CM | POA: Diagnosis not present

## 2015-04-28 DIAGNOSIS — W010XXA Fall on same level from slipping, tripping and stumbling without subsequent striking against object, initial encounter: Secondary | ICD-10-CM | POA: Insufficient documentation

## 2015-04-28 DIAGNOSIS — S62309A Unspecified fracture of unspecified metacarpal bone, initial encounter for closed fracture: Secondary | ICD-10-CM

## 2015-04-28 DIAGNOSIS — S4991XA Unspecified injury of right shoulder and upper arm, initial encounter: Secondary | ICD-10-CM | POA: Diagnosis not present

## 2015-04-28 DIAGNOSIS — S8991XA Unspecified injury of right lower leg, initial encounter: Secondary | ICD-10-CM | POA: Diagnosis not present

## 2015-04-28 DIAGNOSIS — S6991XA Unspecified injury of right wrist, hand and finger(s), initial encounter: Secondary | ICD-10-CM | POA: Diagnosis present

## 2015-04-28 HISTORY — DX: Reserved for inherently not codable concepts without codable children: IMO0001

## 2015-04-28 HISTORY — DX: Gastro-esophageal reflux disease without esophagitis: K21.9

## 2015-04-28 MED ORDER — NAPROXEN 500 MG PO TABS
ORAL_TABLET | ORAL | Status: AC
  Administered 2015-04-28: 500 mg via ORAL
  Filled 2015-04-28: qty 1

## 2015-04-28 MED ORDER — HYDROCODONE-ACETAMINOPHEN 5-325 MG PO TABS: 2.0000 | ORAL_TABLET | Freq: Four times a day (QID) | ORAL | Status: DC | PRN

## 2015-04-28 MED ORDER — NAPROXEN 500 MG PO TABS
500.0000 mg | ORAL_TABLET | Freq: Once | ORAL | Status: AC
  Administered 2015-04-28: 500 mg via ORAL

## 2015-04-28 NOTE — Discharge Instructions (Signed)
Metacarpal Fractures Fractures of metacarpals are breaks in the bones of the hand. They extend from the knuckles to the wrist. These bones can break in many ways. There are different ways of treating these fractures. HOME CARE  Only exercise as told by your doctor.  Return to activities as told by your doctor.  Go to physical therapy as told by your doctor.  Follow your doctor's advice about driving.  Keep the injured hand raised (elevated) above the level of your heart.  If a plaster, fiberglass, or pre-formed splint was applied:  Wear your splint as told and until you are examined again.  Apply ice on the injury for 15-20 minutes at a time, 03-04 times a day. Put the ice in a plastic bag. Place a towel between your skin and the bag.  Do not get your splint or cast wet. Protect it during bathing with a plastic bag.  Loosen the elastic bandage around the splint if your fingers start to get numb, tingle, get cold, or turn blue.  If the splint is plaster, do not lean it on hard surfaces or put pressure on it for 24 hours after it is put on.  Do not  try to scratch the skin under the cast.  Check the skin around the cast every day. You may put lotion on red or sore areas.  Move the fingers of your casted hand several times a day.  Only take medicine as told by your doctor.  Follow up as told by your doctor. This is very important in order to avoid permanent injury, disability, or lasting (chronic) pain. GET HELP RIGHT AWAY IF:   You develop a rash.  You have problems breathing.  You have any allergy problems.  You have more than a small spot of blood from beneath your cast or splint.  You have redness, puffiness (swelling), or more pain from beneath your cast or splint.  Yellowish-white fluid (pus) comes from beneath your cast or splint.  You develop a temperature by mouth above 102 F (38.9 C), not controlled by medicine.  You have a bad smell coming from under your  cast or splint.  You have problems moving any of your fingers. If you do not have a window in your cast for looking at the wound, a fluid or a little bleeding may show up as a stain on the outside of your cast. Tell your doctor about any stains you see. MAKE SURE YOU:   Understand these instructions.  Will watch your condition.  Will get help right away if you are not doing well or get worse. Document Released: 03/18/2008 Document Revised: 02/14/2014 Document Reviewed: 09/05/2009 ExitCare Patient Information 2015 [employer]. This information is not intended to replace advice given to you by your health care provider. Make sure you discuss any questions you have with your health care provider.  

## 2015-04-28 NOTE — ED Provider Notes (Signed)
[facility] Emergency Department Provider Note  ____________________________________________  Time seen: On arrival  I have reviewed the triage vital signs and the nursing notes.   HISTORY  Chief Complaint Fall; Shoulder Pain; and Knee Pain    HPI Jacqueline Delgado is a 64 y.o. female who presents after a fall at the grocery store. She reports she tripped over a mat that had curled up at the edgeand she fell onto her right side. She denies head injury. No neck pain. She complains primarily of pain to the right arm and wrist and hand and also to the right knee. She reports she was able to walk. No loss of consciousness    Past Medical History  Diagnosis Date  . Arthritis     Osteoarthritis in BLE knee  . Hypertension     controlled  . Reflux     There are no active problems to display for this patient.   Past Surgical History  Procedure Laterality Date  . Joint replacement Right     knee, Oct 2012  . Joint replacement      hopefully getting left partial knee replacement in July 2016  . Cholecystectomy    . Abdominal hysterectomy      partial    Current Outpatient Rx  Name  Route  Sig  Dispense  Refill  . HYDROcodone-acetaminophen (NORCO/VICODIN) 5-325 MG per tablet   Oral   Take 2 tablets by mouth every 6 (six) hours as needed for moderate pain.   20 tablet   0   . RABEprazole (ACIPHEX) 20 MG tablet   Oral   Take 20 mg by mouth daily.         Marland Kitchen torsemide (DEMADEX) 20 MG tablet   Oral   Take 20 mg by mouth daily.           Allergies Review of patient's allergies indicates no known allergies.  No family history on file.  Social History History  Substance Use Topics  . Smoking status: Never Smoker   . Smokeless tobacco: Not on file  . Alcohol Use: No    Review of Systems  Constitutional: Negative for fever. Eyes: Negative for visual changes. ENT: Negative for sore throat   Genitourinary: Negative for  dysuria. Musculoskeletal: Negative for back pain. Skin: Negative for rash. Neurological: Negative for headaches or focal weakness   ____________________________________________   PHYSICAL EXAM:  VITAL SIGNS: ED Triage Vitals  Enc Vitals Group     BP 04/28/15 0929 143/83 mmHg     Pulse Rate 04/28/15 0929 76     Resp 04/28/15 0929 19     Temp 04/28/15 0929 97.9 F (36.6 C)     Temp Source 04/28/15 0929 Oral     SpO2 04/28/15 0929 97 %     Weight 04/28/15 0929 262 lb (118.842 kg)     Height 04/28/15 0929 5\' 3"  (1.6 m)     Head Cir --      Peak Flow --      Pain Score 04/28/15 0929 10     Pain Loc --      Pain Edu? --      Excl. in [place]? --      Constitutional: Alert and oriented. Well appearing and in no distress. Eyes: Conjunctivae are normal.  ENT   Head: Normocephalic and atraumatic. The pain in the neck with head movement, no vertebral tenderness   Mouth/Throat: Mucous membranes are moist. Cardiovascular: Normal rate, regular rhythm.  Respiratory: Normal respiratory effort without tachypnea nor retractions.  Gastrointestinal: Soft and non-tender in all quadrants. No distention. There is no CVA tenderness. Musculoskeletal: Patient with mild tenderness to palpation in the right lateral proximal humerus area. Also with tenderness to palpation in the medial right hand at the area of the metacarpal. No bony abnormalities felt. 2+ distal pulses. Full range of motion of wrist and elbow. Able to range shoulder but uncomfortable. No tenderness to palpation of right knee, full range of motion. Neurologic:  Normal speech and language. No gross focal neurologic deficits are appreciated. Skin:  Skin is warm, dry and intact. No rash noted. Psychiatric: Mood and affect are normal. Patient exhibits appropriate insight and judgment.  ____________________________________________    LABS (pertinent positives/negatives)  Labs Reviewed - No data to  display  ____________________________________________     ____________________________________________    RADIOLOGY I have personally reviewed any xrays that were ordered on this patient: Knee x-ray unremarkable, forearm x-ray unremarkable, humerus x-ray unremarkable  Nondisplaced right metacarpal fracture, fifth  ____________________________________________   PROCEDURES  Procedure(s) performed: none   ____________________________________________   INITIAL IMPRESSION / ASSESSMENT AND PLAN / ED COURSE  Pertinent labs & imaging results that were available during my care of the patient were reviewed by me and considered in my medical decision making (see chart for details).  Patient overall well-appearing. X-rays demonstrate nondisplaced right metacarpal fracture. Patient placed in splint and prescribed pain medication and also follow-up recommended in 1 week.  ____________________________________________   FINAL CLINICAL IMPRESSION(S) / ED DIAGNOSES  Final diagnoses:  Metacarpal bone fracture, closed, initial encounter     Lavonia Drafts, MD 04/28/15 1120

## 2015-04-28 NOTE — ED Notes (Signed)
Pt reports she fell at [employer]; reports right knee pain; also reports pain to right hand, wrist, forearm, upper arm and shoulder.

## 2015-04-28 NOTE — ED Notes (Signed)
Pt comes into the ED via EMS from [place] , states she slipped and fell and is having right shoulder and knee pain with no noted deformity.

## 2015-05-12 ENCOUNTER — Other Ambulatory Visit: Payer: Self-pay | Admitting: Orthopedic Surgery

## 2015-05-12 DIAGNOSIS — S43421A Sprain of right rotator cuff capsule, initial encounter: Secondary | ICD-10-CM

## 2015-05-12 DIAGNOSIS — M25511 Pain in right shoulder: Secondary | ICD-10-CM

## 2015-05-17 ENCOUNTER — Ambulatory Visit
Admission: RE | Admit: 2015-05-17 | Discharge: 2015-05-17 | Disposition: A | Discharge status: Home / Self Care | Payer: 59 | Source: Ambulatory Visit | Attending: Orthopedic Surgery | Admitting: Orthopedic Surgery

## 2015-05-17 DIAGNOSIS — S46811A Strain of other muscles, fascia and tendons at shoulder and upper arm level, right arm, initial encounter: Secondary | ICD-10-CM | POA: Insufficient documentation

## 2015-05-17 DIAGNOSIS — M25511 Pain in right shoulder: Secondary | ICD-10-CM

## 2015-05-17 DIAGNOSIS — R531 Weakness: Secondary | ICD-10-CM | POA: Diagnosis present

## 2015-05-17 DIAGNOSIS — S43421A Sprain of right rotator cuff capsule, initial encounter: Secondary | ICD-10-CM

## 2015-05-17 DIAGNOSIS — M19011 Primary osteoarthritis, right shoulder: Secondary | ICD-10-CM | POA: Insufficient documentation

## 2015-05-17 DIAGNOSIS — M67813 Other specified disorders of tendon, right shoulder: Secondary | ICD-10-CM | POA: Diagnosis not present

## 2015-05-17 DIAGNOSIS — R609 Edema, unspecified: Secondary | ICD-10-CM | POA: Insufficient documentation

## 2015-05-19 ENCOUNTER — Ambulatory Visit: Payer: 59

## 2015-05-23 DIAGNOSIS — M75121 Complete rotator cuff tear or rupture of right shoulder, not specified as traumatic: Secondary | ICD-10-CM | POA: Insufficient documentation

## 2015-06-06 ENCOUNTER — Encounter
Admission: RE | Admit: 2015-06-06 | Discharge: 2015-06-06 | Disposition: A | Discharge status: Home / Self Care | Payer: 59 | Source: Ambulatory Visit | Attending: Unknown Physician Specialty | Admitting: Unknown Physician Specialty

## 2015-06-06 DIAGNOSIS — Z01812 Encounter for preprocedural laboratory examination: Secondary | ICD-10-CM | POA: Insufficient documentation

## 2015-06-06 HISTORY — DX: Sleep apnea, unspecified: G47.30

## 2015-06-06 HISTORY — DX: Personal history of other diseases of the digestive system: Z87.19

## 2015-06-06 HISTORY — DX: Gastro-esophageal reflux disease without esophagitis: K21.9

## 2015-06-06 LAB — POTASSIUM: Potassium: 3.8 mmol/L (ref 3.5–5.1)

## 2015-06-06 NOTE — Patient Instructions (Addendum)
  Your procedure is scheduled on: 06/14/15 Report to Day Surgery. [facility]OND FLOOR To find out your arrival time please call [phone] between 1PM - 3PM on 06/13/15  Remember: Instructions that are not followed completely may result in serious medical risk, up to and including death, or upon the discretion of your surgeon and anesthesiologist your surgery may need to be rescheduled.    _X___ 1. Do not eat food or drink liquids after midnight. No gum chewing or hard candies.     _X___ 2. No Alcohol for 24 hours before or after surgery.   ____ 3. Bring all medications with you on the day of surgery if instructed.    _X___ 4. Notify your doctor if there is any change in your medical condition     (cold, fever, infections).     Do not wear jewelry, make-up, hairpins, clips or nail polish.  Do not wear lotions, powders, or perfumes. You may wear deodorant.  Do not shave 48 hours prior to surgery. Men may shave face and neck.  Do not bring valuables to the hospital.    [facility] is not responsible for any belongings or valuables.               Contacts, dentures or bridgework may not be worn into surgery.  Leave your suitcase in the car. After surgery it may be brought to your room.  For patients admitted to the hospital, discharge time is determined by your                treatment team.   Patients discharged the day of surgery will not be allowed to drive home.   Please read over the following fact sheets that you were given:   Surgical Site Infection Prevention   ____ Take these medicines the morning of surgery with A SIP OF WATER:    1.   2.   3.   4.  5.  6.  ____ Fleet Enema (as directed)   _X___ Use CHG Soap as directed  ____ Use inhalers on the day of surgery  ____ Stop metformin 2 days prior to surgery    ____ Take 1/2 of usual insulin dose the night before surgery and none on the morning of surgery.   ____ Stop Coumadin/Plavix/aspirin on   __X__  Stop Anti-inflammatories on  STOP NAPROXEN NOW  ____ Stop supplements until after surgery.    ____ Bring C-Pap to the hospital.

## 2015-06-14 ENCOUNTER — Ambulatory Visit
Admission: RE | Admit: 2015-06-14 | Discharge: 2015-06-14 | Disposition: A | Discharge status: Home / Self Care | Payer: 59 | Source: Ambulatory Visit | Attending: Unknown Physician Specialty | Admitting: Unknown Physician Specialty

## 2015-06-14 ENCOUNTER — Ambulatory Visit: Payer: 59 | Admitting: Anesthesiology

## 2015-06-14 ENCOUNTER — Encounter
Admission: RE | Disposition: A | Discharge status: Home / Self Care | Payer: Self-pay | Source: Ambulatory Visit | Attending: Unknown Physician Specialty

## 2015-06-14 ENCOUNTER — Encounter: Payer: Self-pay | Admitting: *Deleted

## 2015-06-14 DIAGNOSIS — Z823 Family history of stroke: Secondary | ICD-10-CM | POA: Diagnosis not present

## 2015-06-14 DIAGNOSIS — Z96659 Presence of unspecified artificial knee joint: Secondary | ICD-10-CM | POA: Diagnosis not present

## 2015-06-14 DIAGNOSIS — M479 Spondylosis, unspecified: Secondary | ICD-10-CM | POA: Insufficient documentation

## 2015-06-14 DIAGNOSIS — G4730 Sleep apnea, unspecified: Secondary | ICD-10-CM | POA: Insufficient documentation

## 2015-06-14 DIAGNOSIS — Z808 Family history of malignant neoplasm of other organs or systems: Secondary | ICD-10-CM | POA: Insufficient documentation

## 2015-06-14 DIAGNOSIS — Z886 Allergy status to analgesic agent status: Secondary | ICD-10-CM | POA: Diagnosis not present

## 2015-06-14 DIAGNOSIS — Z86010 Personal history of colonic polyps: Secondary | ICD-10-CM | POA: Diagnosis not present

## 2015-06-14 DIAGNOSIS — Z8249 Family history of ischemic heart disease and other diseases of the circulatory system: Secondary | ICD-10-CM | POA: Insufficient documentation

## 2015-06-14 DIAGNOSIS — Z90710 Acquired absence of both cervix and uterus: Secondary | ICD-10-CM | POA: Diagnosis not present

## 2015-06-14 DIAGNOSIS — Z9049 Acquired absence of other specified parts of digestive tract: Secondary | ICD-10-CM | POA: Diagnosis not present

## 2015-06-14 DIAGNOSIS — E669 Obesity, unspecified: Secondary | ICD-10-CM | POA: Diagnosis not present

## 2015-06-14 DIAGNOSIS — Z79899 Other long term (current) drug therapy: Secondary | ICD-10-CM | POA: Diagnosis not present

## 2015-06-14 DIAGNOSIS — K219 Gastro-esophageal reflux disease without esophagitis: Secondary | ICD-10-CM | POA: Insufficient documentation

## 2015-06-14 DIAGNOSIS — M170 Bilateral primary osteoarthritis of knee: Secondary | ICD-10-CM | POA: Insufficient documentation

## 2015-06-14 DIAGNOSIS — Z8379 Family history of other diseases of the digestive system: Secondary | ICD-10-CM | POA: Insufficient documentation

## 2015-06-14 DIAGNOSIS — Z8489 Family history of other specified conditions: Secondary | ICD-10-CM | POA: Diagnosis not present

## 2015-06-14 DIAGNOSIS — R0789 Other chest pain: Secondary | ICD-10-CM | POA: Insufficient documentation

## 2015-06-14 DIAGNOSIS — M75121 Complete rotator cuff tear or rupture of right shoulder, not specified as traumatic: Secondary | ICD-10-CM | POA: Insufficient documentation

## 2015-06-14 DIAGNOSIS — I776 Arteritis, unspecified: Secondary | ICD-10-CM | POA: Diagnosis not present

## 2015-06-14 DIAGNOSIS — I10 Essential (primary) hypertension: Secondary | ICD-10-CM | POA: Insufficient documentation

## 2015-06-14 DIAGNOSIS — K449 Diaphragmatic hernia without obstruction or gangrene: Secondary | ICD-10-CM | POA: Diagnosis not present

## 2015-06-14 DIAGNOSIS — Z833 Family history of diabetes mellitus: Secondary | ICD-10-CM | POA: Diagnosis not present

## 2015-06-14 DIAGNOSIS — M7541 Impingement syndrome of right shoulder: Secondary | ICD-10-CM | POA: Insufficient documentation

## 2015-06-14 DIAGNOSIS — Z803 Family history of malignant neoplasm of breast: Secondary | ICD-10-CM | POA: Insufficient documentation

## 2015-06-14 DIAGNOSIS — Z6841 Body Mass Index (BMI) 40.0 and over, adult: Secondary | ICD-10-CM | POA: Insufficient documentation

## 2015-06-14 DIAGNOSIS — M67813 Other specified disorders of tendon, right shoulder: Secondary | ICD-10-CM | POA: Insufficient documentation

## 2015-06-14 HISTORY — PX: SHOULDER ARTHROSCOPY WITH ROTATOR CUFF REPAIR AND SUBACROMIAL DECOMPRESSION: SHX5686

## 2015-06-14 SURGERY — SHOULDER ARTHROSCOPY WITH ROTATOR CUFF REPAIR AND SUBACROMIAL DECOMPRESSION
Anesthesia: Choice | Site: Shoulder | Laterality: Right | Wound class: Clean

## 2015-06-14 MED ORDER — ONDANSETRON HCL 4 MG/2ML IJ SOLN
INTRAMUSCULAR | Status: DC | PRN
  Administered 2015-06-14: 4 mg via INTRAVENOUS

## 2015-06-14 MED ORDER — MIDAZOLAM HCL 5 MG/5ML IJ SOLN
2.0000 mg | INTRAMUSCULAR | Status: DC | PRN
  Administered 2015-06-14: 2 mg via INTRAVENOUS
  Administered 2015-06-14: 1 mg via INTRAVENOUS
  Administered 2015-06-14: 2 mg via INTRAVENOUS

## 2015-06-14 MED ORDER — DEXAMETHASONE SODIUM PHOSPHATE 4 MG/ML IJ SOLN
INTRAMUSCULAR | Status: DC | PRN
Start: 2015-06-14 — End: 2015-06-14
  Administered 2015-06-14: 10 mg via INTRAVENOUS

## 2015-06-14 MED ORDER — FENTANYL CITRATE (PF) 100 MCG/2ML IJ SOLN
INTRAMUSCULAR | Status: AC
  Administered 2015-06-14: 50 ug via INTRAVENOUS
  Filled 2015-06-14: qty 2

## 2015-06-14 MED ORDER — ACETAMINOPHEN 10 MG/ML IV SOLN
INTRAVENOUS | Status: DC | PRN
  Administered 2015-06-14: 1000 mg via INTRAVENOUS

## 2015-06-14 MED ORDER — PROPOFOL 10 MG/ML IV BOLUS
INTRAVENOUS | Status: DC | PRN
  Administered 2015-06-14: 200 mg via INTRAVENOUS

## 2015-06-14 MED ORDER — ACETAMINOPHEN 10 MG/ML IV SOLN
INTRAVENOUS | Status: AC
  Filled 2015-06-14: qty 100

## 2015-06-14 MED ORDER — FENTANYL CITRATE (PF) 100 MCG/2ML IJ SOLN
25.0000 ug | INTRAMUSCULAR | Status: DC | PRN
  Administered 2015-06-14 (×4): 25 ug via INTRAVENOUS

## 2015-06-14 MED ORDER — ROXICET 5-325 MG PO TABS: 1.0000 | ORAL_TABLET | Freq: Four times a day (QID) | ORAL | Status: DC | PRN

## 2015-06-14 MED ORDER — LIDOCAINE HCL (CARDIAC) 20 MG/ML IV SOLN
INTRAVENOUS | Status: DC | PRN
  Administered 2015-06-14: 100 mg via INTRAVENOUS
  Administered 2015-06-14: 2 mg via INTRAVENOUS

## 2015-06-14 MED ORDER — LIDOCAINE HCL 2 % IJ SOLN
0.1000 mL | Freq: Once | INTRAMUSCULAR | Status: DC
  Filled 2015-06-14: qty 10

## 2015-06-14 MED ORDER — CEFAZOLIN SODIUM-DEXTROSE 2-3 GM-% IV SOLR
INTRAVENOUS | Status: DC | PRN
  Administered 2015-06-14: 2 g via INTRAVENOUS

## 2015-06-14 MED ORDER — ROCURONIUM BROMIDE 100 MG/10ML IV SOLN
INTRAVENOUS | Status: DC | PRN
  Administered 2015-06-14: 10 mg via INTRAVENOUS
  Administered 2015-06-14: 40 mg via INTRAVENOUS

## 2015-06-14 MED ORDER — FENTANYL CITRATE (PF) 100 MCG/2ML IJ SOLN
INTRAMUSCULAR | Status: AC
  Filled 2015-06-14: qty 2

## 2015-06-14 MED ORDER — KETOROLAC TROMETHAMINE 30 MG/ML IJ SOLN
INTRAMUSCULAR | Status: DC | PRN
  Administered 2015-06-14: 30 mg via INTRAVENOUS

## 2015-06-14 MED ORDER — LACTATED RINGERS IV SOLN
INTRAVENOUS | Status: DC
  Administered 2015-06-14: 13:00:00 via INTRAVENOUS
  Administered 2015-06-14: 50 mL/h via INTRAVENOUS
  Administered 2015-06-14: 13:00:00 via INTRAVENOUS

## 2015-06-14 MED ORDER — FENTANYL CITRATE (PF) 100 MCG/2ML IJ SOLN
INTRAMUSCULAR | Status: DC | PRN
  Administered 2015-06-14: 100 ug via INTRAVENOUS

## 2015-06-14 MED ORDER — ROPIVACAINE HCL 2 MG/ML IJ SOLN
INTRAMUSCULAR | Status: AC
Start: 2015-06-14 — End: 2015-06-14
  Administered 2015-06-14: 12 mL via EPIDURAL
  Filled 2015-06-14: qty 20

## 2015-06-14 MED ORDER — FENTANYL CITRATE (PF) 100 MCG/2ML IJ SOLN
INTRAMUSCULAR | Status: AC
  Administered 2015-06-14: 25 ug via INTRAVENOUS
  Filled 2015-06-14: qty 2

## 2015-06-14 MED ORDER — FENTANYL CITRATE (PF) 100 MCG/2ML IJ SOLN
50.0000 ug | Freq: Once | INTRAMUSCULAR | Status: AC
  Administered 2015-06-14: 50 ug via INTRAVENOUS

## 2015-06-14 MED ORDER — PHENYLEPHRINE HCL 10 MG/ML IJ SOLN
INTRAMUSCULAR | Status: DC | PRN
  Administered 2015-06-14 (×3): 100 ug via INTRAVENOUS

## 2015-06-14 MED ORDER — MIDAZOLAM HCL 5 MG/5ML IJ SOLN
INTRAMUSCULAR | Status: AC
  Administered 2015-06-14: 2 mg via INTRAVENOUS
  Filled 2015-06-14: qty 5

## 2015-06-14 MED ORDER — HYDROMORPHONE HCL 1 MG/ML IJ SOLN
0.2500 mg | INTRAMUSCULAR | Status: DC | PRN
  Administered 2015-06-14 (×4): 0.25 mg via INTRAVENOUS

## 2015-06-14 MED ORDER — HYDROMORPHONE HCL 1 MG/ML IJ SOLN
INTRAMUSCULAR | Status: AC
  Administered 2015-06-14: 0.25 mg via INTRAVENOUS
  Filled 2015-06-14: qty 1

## 2015-06-14 MED ORDER — ONDANSETRON HCL 4 MG/2ML IJ SOLN: 4.0000 mg | Freq: Once | INTRAMUSCULAR | Status: DC | PRN

## 2015-06-14 MED ORDER — ROPIVACAINE HCL 5 MG/ML IJ SOLN
INTRAMUSCULAR | Status: AC
  Administered 2015-06-14: 12 mL via EPIDURAL
  Filled 2015-06-14: qty 20

## 2015-06-14 MED ORDER — GLYCOPYRROLATE 0.2 MG/ML IJ SOLN
INTRAMUSCULAR | Status: DC | PRN
  Administered 2015-06-14: 0.2 mg via INTRAVENOUS

## 2015-06-14 MED ORDER — MIDAZOLAM HCL 2 MG/2ML IJ SOLN
INTRAMUSCULAR | Status: DC | PRN
Start: 2015-06-14 — End: 2015-06-14
  Administered 2015-06-14: 2 mg via INTRAVENOUS

## 2015-06-14 SURGICAL SUPPLY — 71 items
ADAPTER IRRIG TUBE 2 SPIKE SOL (ADAPTER) ×4 IMPLANT
ANCHOR PEEK 5.5MM (Anchor) ×2 IMPLANT
ANCHOR SPARTA PEEK 6.5 (Anchor) ×4 IMPLANT
ANCHOR SUT SCREW SPEED 6.5 (Screw) ×4 IMPLANT
ARTHROWAND PARAGON T2 (SURGICAL WAND)
BLADE ABRADER 4.5 (BLADE) IMPLANT
BLADE SHAVER 4.5X7 STR FR (MISCELLANEOUS) ×2 IMPLANT
BLADE SURG 15 STRL LF DISP TIS (BLADE) IMPLANT
BLADE SURG 15 STRL SS (BLADE)
BUR ABRADER 5.5 BLK (MISCELLANEOUS) ×1 IMPLANT
BUR BR 5.5 WIDE MOUTH (BURR) ×2 IMPLANT
BURR ABRADER 5.5 BLK (MISCELLANEOUS) ×2
CANNULA 8.5X75 THRED (CANNULA) IMPLANT
CANNULA SHAVER 8MMX76MM (CANNULA) IMPLANT
CAP LOCK ULTRA CANNULA (MISCELLANEOUS) ×2 IMPLANT
CHLORAPREP W/TINT 26ML (MISCELLANEOUS) ×4 IMPLANT
DRAPE STERI 35X30 U-POUCH (DRAPES) ×2 IMPLANT
GAUZE SPONGE 4X4 12PLY STRL (GAUZE/BANDAGES/DRESSINGS) ×2 IMPLANT
GLOVE BIO SURGEON STRL SZ7.5 (GLOVE) ×4 IMPLANT
GLOVE BIO SURGEON STRL SZ8 (GLOVE) ×4 IMPLANT
GLOVE INDICATOR 8.0 STRL GRN (GLOVE) ×2 IMPLANT
GOWN STRL REUS W/ TWL LRG LVL3 (GOWN DISPOSABLE) ×1 IMPLANT
GOWN STRL REUS W/TWL LRG LVL3 (GOWN DISPOSABLE) ×1
GOWN STRL REUS W/TWL LRG LVL4 (GOWN DISPOSABLE) ×2 IMPLANT
IV LACTATED RINGER IRRG 3000ML (IV SOLUTION) ×4
IV LR IRRIG 3000ML ARTHROMATIC (IV SOLUTION) ×4 IMPLANT
KIT SHOULDER TRACTION (DRAPES) ×2 IMPLANT
MANIFOLD 4PT FOR NEPTUNE1 (MISCELLANEOUS) ×2 IMPLANT
NEEDLE 18GX1X1/2 (RX/OR ONLY) (NEEDLE) IMPLANT
NEEDLE MAYO CATGUT SZ 1.5 (NEEDLE)
NEEDLE MAYO CATGUT SZ 2 (NEEDLE) IMPLANT
NEEDLE SPNL 18GX3.5 QUINCKE PK (NEEDLE) IMPLANT
PACK ARTHROSCOPY SHOULDER (MISCELLANEOUS) ×2 IMPLANT
PAD GROUND ADULT SPLIT (MISCELLANEOUS) ×2 IMPLANT
PASSER SUT CAPTURE FIRST (SUTURE) ×2 IMPLANT
SET TUBE SUCT SHAVER OUTFL 24K (TUBING) ×2 IMPLANT
SLING ULTRA II M (MISCELLANEOUS) ×2 IMPLANT
SOL PREP PVP 2OZ (MISCELLANEOUS) ×2
SOLUTION PREP PVP 2OZ (MISCELLANEOUS) ×1 IMPLANT
STAPLER SKIN PROX 35W (STAPLE) IMPLANT
SUT ETHIBOND NAB CT1 #1 30IN (SUTURE) ×2 IMPLANT
SUT ETHILON 3-0 FS-10 30 BLK (SUTURE)
SUT PDS AB 1 CT1 27 (SUTURE) IMPLANT
SUT PERFECTPASSER WHITE CART (SUTURE) IMPLANT
SUT PROLENE 2 0 CT2 30 (SUTURE) IMPLANT
SUT SMART STITCH CARTRIDGE (SUTURE) IMPLANT
SUT TICRON 2-0 30IN 311381 (SUTURE) IMPLANT
SUT VIC AB 0 CT1 36 (SUTURE) IMPLANT
SUT VIC AB 0 CT2 27 (SUTURE) IMPLANT
SUT VIC AB 2-0 CT1 27 (SUTURE)
SUT VIC AB 2-0 CT1 TAPERPNT 27 (SUTURE) IMPLANT
SUT VIC AB 2-0 CT2 27 (SUTURE) IMPLANT
SUT VIC AB 2-0 SH 27 (SUTURE)
SUT VIC AB 2-0 SH 27XBRD (SUTURE) IMPLANT
SUT VIC AB 3-0 SH 27 (SUTURE)
SUT VIC AB 3-0 SH 27X BRD (SUTURE) IMPLANT
SUTURE EHLN 3-0 FS-10 30 BLK (SUTURE) IMPLANT
SUTURE MAGNUM WIRE 2X48 BLK (SUTURE) ×10 IMPLANT
SYRINGE 10CC LL (SYRINGE) IMPLANT
TAPE MICROFOAM 4IN (TAPE) ×2 IMPLANT
TUBING ARTHRO INFLOW-ONLY STRL (TUBING) ×2 IMPLANT
WAND 30 DEG SABER W/CORD (SURGICAL WAND) IMPLANT
WAND ARTHRO PARAGON T2 (SURGICAL WAND) IMPLANT
WAND COVAC 50 IFS (MISCELLANEOUS) IMPLANT
WAND COVATOR 20 (MISCELLANEOUS) IMPLANT
WAND HAND CNTRL MULTIVAC 50 (MISCELLANEOUS) IMPLANT
WAND HAND CNTRL MULTIVAC 90 (MISCELLANEOUS) ×2 IMPLANT
WAND TENDON TOPAZ 0 ANGL (MISCELLANEOUS) IMPLANT
WAND TOPAZ EPF  WAS Q (MISCELLANEOUS)
WAND TOPAZ EPF WAS Q (MISCELLANEOUS) IMPLANT
WIRE MAGNUM (SUTURE) ×2 IMPLANT

## 2015-06-14 NOTE — Anesthesia Postprocedure Evaluation (Signed)
  Anesthesia Post-op Note  Patient: Jacqueline Delgado  Procedure(s) Performed: Procedure(s): SHOULDER ARTHROSCOPY WITH mini open ROTATOR CUFF REPAIR AND SUBACROMIAL DECOMPRESSION, release long head biceps tendon. (Right)  Anesthesia type:No value filed.  Patient location: PACU  Post pain: Pain level controlled  Post assessment: Post-op Vital signs reviewed, Patient's Cardiovascular Status Stable, Respiratory Function Stable, Patent Airway and No signs of Nausea or vomiting  Post vital signs: Reviewed and stable  Last Vitals:  Filed Vitals:   06/14/15 1559  BP: 166/98  Pulse: 106  Temp: 36.4 C  Resp: 0    Level of consciousness: awake, alert  and patient cooperative  Complications: No apparent anesthesia complications

## 2015-06-14 NOTE — Progress Notes (Signed)
BP 149/92

## 2015-06-14 NOTE — Anesthesia Preprocedure Evaluation (Addendum)
Anesthesia Evaluation  Patient identified by MRN, date of birth, ID band  Reviewed: Allergy & Precautions, NPO status   Airway Mallampati: II  TM Distance: >3 FB Neck ROM: Full    Dental  (+) Caps, Chipped   Pulmonary sleep apnea ,    Pulmonary exam normal       Cardiovascular hypertension, Normal cardiovascular exam    Neuro/Psych negative neurological ROS  negative psych ROS   GI/Hepatic Neg liver ROS, hiatal hernia, GERD-  Medicated and Controlled,  Endo/Other  negative endocrine ROS  Renal/GU negative Renal ROS     Musculoskeletal  (+) Arthritis -, Osteoarthritis,    Abdominal Normal abdominal exam  (+)   Peds negative pediatric ROS (+)  Hematology negative hematology ROS (+)   Anesthesia Other Findings   Reproductive/Obstetrics                           Anesthesia Physical Anesthesia Plan  ASA: II  Anesthesia Plan: General   Post-op Pain Management: MAC Combined w/ Regional for Post-op pain   Induction: Intravenous  Airway Management Planned: Oral ETT  Additional Equipment:   Intra-op Plan:   Post-operative Plan: Extubation in OR  Informed Consent: I have reviewed the patients History and Physical, chart, labs and discussed the procedure including the risks, benefits and alternatives for the proposed anesthesia with the patient or authorized representative who has indicated his/her understanding and acceptance.   Dental advisory given  Plan Discussed with: CRNA and Surgeon  Anesthesia Plan Comments: (Discussed in detail the interscalene block with its possible risks and benefits.  Patient agrees to the procedure and voiced her support for it.)        Anesthesia Quick Evaluation

## 2015-06-14 NOTE — Op Note (Signed)
06/14/2015  5:58 PM  Patient:   Jacqueline Delgado  Pre-Op Diagnosis:   COMPLETE TEAR RIGHT SHOULDER ROTATOR cuff  Postoperative diagnosis: Same plus long head of the biceps tendinosis and secondary impingement  Procedure: Arthroscopic subacromial decompression plus arthroscopic release of the long head of biceps tendon followed by mini incision rotator cuff repair  Anesthesia: General endotracheal with interscalene block placed preoperatively by the anesthesiologist.  Findings: As above.   Complications: None  Estimated blood loss: negligible  Tourniquet time: None  Drains: None   Brief clinical note:  The patient's symptoms have progressed despite medications, activity modification, etc. The patient's history and examination are consistent with rotator cuff tear right shoulder. These findings were confirmed by MRI scan. The patient presents at this time for definitive management of these shoulder symptoms.  Procedure: The patient was brought into the operating room and placed in the supine position. The patient then underwent general endotracheal intubation and anesthesia before being repositioned in the lateral decubitus position. The right shoulder was prepped and draped in usual fashion for shoulder procedure. The shoulder was supported with the Acufex shoulder suspension device.  12 pounds of traction was utilized. Preoperative antibiotics were administered. A timeout was performed . A posterior portal was created and the glenohumeral joint thoroughly inspected revealing fraying of the long head of biceps tendon plus a sizable rotator cuff tear that involved the supraspinatus and infraspinatus tendons. An anterior portal was created. An ArthroCare wand was inserted and used to obtain hemostasis as well as to perform a limited synovectomy.The biceps tendon was evaluated and then released from its labral attachment using an ArthroCare wand.  The scope was repositioned  through the posterior portal into the subacromial space. A separate lateral portal was created using an outside-in technique. An ArthroCare 90 wand followed by a 4.0 full-radius resector was introduced and used to perform a subtotal bursectomy. The ArthroCare wand was then inserted and used to remove the periosteal tissue off the undersurface of the anterior third of the acromion as well as to recess the coracoacromial ligament from its attachment along the anterior and lateral margins of the acromion.   With the scope in the lateral portal a 5.67mm acromionizing bur was introduced through the posterior portal and used to perform the decompression by removing the undersurface of the anterior third of the acromion. The instruments were then removed from the subacromial space.  An approximately 4 cm incision was made over the midlateral aspect of the shoulder.   This incision was carried down through the subcutaneous tissues onto the deltoid. The deltoid was divided in line with its fibers to provide access into the subacromial space. The rotator cuff tear was readily identified. The margins were debrided.   The tear was repaired using horizontal mattress sutures secured to two 6.5 Spartan anchors. After being tied the horizontal mattress suture tails were then crisscrossed over to 2 laterally placed 6.5 speed screws. This gave me a 2 row repair of the torn rotator cuff. The repaired cuff seemed to be  quite stable. I also placed 3 nonabsorbable #1 sutures anteriorly in the longitudinal portion of the tear to coapt it. Basically there was an L-shaped component to the tear. I then separated another Spartan anchor with a #1 nonabsorbable suture that had been placed in horizontal mattress fashion to the very posterior portion of the tear.   The wound was copiously irrigated with sterile saline solution before the deltoid was repaired to bone with #1 ethibond  suture.  Deltoid interval was closed with 0 vicryl. The  subcutaneous tissues were closed  using 2-0 Vicryl interrupted sutures and the skin incision was closed using staples. The portal sites also were closed using 3-O nylon sutures. A sterile bulky dressing was applied to the shoulder plus 4 TENS pads before the arm was placed into a shoulder immobilizer. The patient was then awakened, extubated, and returned to the recovery room in satisfactory condition after tolerating the procedure well.  Blood loss was negligible.

## 2015-06-14 NOTE — Discharge Instructions (Signed)
Use shoulder immobilizer at all times  Keep dressing dry  Leave dressing in place until first postoperative visit  Return to the clinic about 1 week post surgery  Take 81 mg aspirin or Bufferin tablet twice a day for 2 weeks post surgery  Can sleep with multiple pillows behind the back or in a recliner  Use TENS unit if prescribed  Take pain medication prior to going to sleep the evening of your surgery  Ice pack prn

## 2015-06-14 NOTE — Anesthesia Procedure Notes (Addendum)
Procedure Name: Intubation Date/Time: 06/14/2015 1:02 PM Performed by: Doreen Salvage Pre-anesthesia Checklist: Patient identified, Patient being monitored, Timeout performed, Emergency Drugs available and Suction available Patient Re-evaluated:Patient Re-evaluated prior to inductionOxygen Delivery Method: Circle system utilized Preoxygenation: Pre-oxygenation with 100% oxygen Intubation Type: IV induction Ventilation: Mask ventilation without difficulty Laryngoscope Size: Mac and 3 Grade View: Grade I Tube type: Oral Tube size: 7.0 mm Number of attempts: 1 Airway Equipment and Method: Stylet Placement Confirmation: ETT inserted through vocal cords under direct vision,  positive ETCO2 and breath sounds checked- equal and bilateral Secured at: 21 cm Tube secured with: Tape Dental Injury: Teeth and Oropharynx as per pre-operative assessment    Anesthesia Regional Block:  Interscalene brachial plexus block  Pre-Anesthetic Checklist: ,, timeout performed, Correct Patient, Correct Site, Correct Laterality, Correct Procedure, Correct Position, site marked, Risks and benefits discussed,  Surgical consent,  Pre-op evaluation,  At surgeon's request and post-op pain management  Laterality: Right  Prep: Betadine and alcohol swabs       Needles:  Injection technique: Single-shot  Needle Type: Stimiplex     Needle Length: 5cm 5 cm Needle Gauge: 22 and 22 G    Additional Needles:  Procedures: ultrasound guided (picture in chart) and nerve stimulator Interscalene brachial plexus block  Nerve Stimulator or Paresthesia:  Response: biceps flexion, 0.4 mA,   Additional Responses:   Narrative:  Start time: 06/14/2015 12:15 PM End time: 06/14/2015 12:30 PM Injection made incrementally with aspirations every 5 mL.  Performed by: Personally  Anesthesiologist: Alvin Critchley  Additional Notes: Functioning IV was confirmed and monitors were applied.  A 71mm 22ga Stimuplex needle was used.  Sterile prep and drape,hand hygiene and sterile gloves were used.  Negative aspiration and negative test dose prior to incremental administration of local anesthetic. The patient tolerated the procedure well. Assisted by Dr. Rosey Bath.  25 cc of an equal mix of 0.2% + 0.5% Ropivacaine + epi 1: 200 K  Easily incrementally injected, after 2cc skin wheal with 1% lidocaine

## 2015-06-14 NOTE — H&P (Signed)
  H and P reviewed. No changes. Uploaded at later date. 

## 2015-06-14 NOTE — Transfer of Care (Signed)
Immediate Anesthesia Transfer of Care Note  Patient: Jacqueline Delgado  Procedure(s) Performed: Procedure(s): SHOULDER ARTHROSCOPY WITH mini open ROTATOR CUFF REPAIR AND SUBACROMIAL DECOMPRESSION, release long head biceps tendon. (Right)  Patient Location: PACU  Anesthesia Type:General  Level of Consciousness: sedated  Airway & Oxygen Therapy: Patient Spontanous Breathing and Patient connected to face mask oxygen  Post-op Assessment: Report given to RN and Post -op Vital signs reviewed and stable  Post vital signs: Reviewed and stable  Last Vitals:  Filed Vitals:   06/14/15 1559  BP: 166/98  Pulse: 106  Temp: 36.4 C  Resp: 0    Complications: No apparent anesthesia complications

## 2015-06-15 ENCOUNTER — Encounter: Payer: Self-pay | Admitting: Unknown Physician Specialty

## 2015-06-18 ENCOUNTER — Encounter: Payer: Self-pay | Admitting: Medical Oncology

## 2015-06-18 ENCOUNTER — Emergency Department
Admission: EM | Admit: 2015-06-18 | Discharge: 2015-06-18 | Disposition: A | Discharge status: Home / Self Care | Payer: 59 | Attending: Emergency Medicine | Admitting: Emergency Medicine

## 2015-06-18 DIAGNOSIS — Z79899 Other long term (current) drug therapy: Secondary | ICD-10-CM | POA: Diagnosis not present

## 2015-06-18 DIAGNOSIS — I10 Essential (primary) hypertension: Secondary | ICD-10-CM | POA: Insufficient documentation

## 2015-06-18 DIAGNOSIS — G8918 Other acute postprocedural pain: Secondary | ICD-10-CM | POA: Diagnosis not present

## 2015-06-18 DIAGNOSIS — Z760 Encounter for issue of repeat prescription: Secondary | ICD-10-CM | POA: Diagnosis present

## 2015-06-18 MED ORDER — HYDROCODONE-ACETAMINOPHEN 5-325 MG PO TABS
2.0000 | ORAL_TABLET | Freq: Once | ORAL | Status: AC
  Administered 2015-06-18: 2 via ORAL
  Filled 2015-06-18: qty 2

## 2015-06-18 MED ORDER — HYDROCODONE-ACETAMINOPHEN 5-325 MG PO TABS: 1.0000 | ORAL_TABLET | ORAL | Status: DC | PRN

## 2015-06-18 NOTE — ED Notes (Signed)
Pt ambulatory to triage with reports of rt rotator cuff surgery Wednesday, since then the oxycodone that she was prescribed is making her sick on her stomach, she called the ortho on call and was told to come to er bc hydrocodone could not be called in over the phone. Pt here for RX.

## 2015-06-18 NOTE — ED Provider Notes (Signed)
[facility] Emergency Department Provider Note  ____________________________________________  Time seen: Approximately 3:24 PM  I have reviewed the triage vital signs and the nursing notes.   HISTORY  Chief Complaint Medication Refill   HPI Jacqueline Delgado is a 64 y.o. female is here for pain control. Patient states that she had surgery on her right shoulder for rotator cuff tear. Dr. Jefm Bryant did this surgery Wednesday and prescribed Percocet. She states that every time she takes it she gets nauseous and vomits. She did contact the person on-call to instructed her to come to the emergency room for a different prescription. She states she has taken hydrocodone in the past without any difficulty. She last tried the Percocet 2 AM with something for nausea and still vomited. Patient denies any fever or chills. She has follow-up appointment with her surgeon.   Past Medical History  Diagnosis Date  . Arthritis     Osteoarthritis in BLE knee  . Hypertension     controlled  . Reflux   . GERD (gastroesophageal reflux disease)   . History of hiatal hernia   . Sleep apnea     NO CPAP    There are no active problems to display for this patient.   Past Surgical History  Procedure Laterality Date  . Joint replacement Right     knee, Oct 2012  . Joint replacement      hopefully getting left partial knee replacement in July 2016  . Cholecystectomy    . Abdominal hysterectomy      partial  . Shoulder arthroscopy with rotator cuff repair and subacromial decompression Right 06/14/2015    Procedure: SHOULDER ARTHROSCOPY WITH mini open ROTATOR CUFF REPAIR AND SUBACROMIAL DECOMPRESSION, release long head biceps tendon.;  Surgeon: Leanor Kail, MD;  Location: [facility] ORS;  Service: Orthopedics;  Laterality: Right;    Current Outpatient Rx  Name  Route  Sig  Dispense  Refill  . diazepam (VALIUM) 5 MG tablet   Oral   Take 5 mg by mouth every 8 (eight) hours as needed  for anxiety.         Marland Kitchen HYDROcodone-acetaminophen (NORCO/VICODIN) 5-325 MG per tablet   Oral   Take 1-2 tablets by mouth every 4 (four) hours as needed for moderate pain.   30 tablet   0   . naproxen (NAPROSYN) 500 MG tablet   Oral   Take 500 mg by mouth at bedtime as needed.         . potassium chloride SA (K-DUR,KLOR-CON) 20 MEQ tablet   Oral   Take 20 mEq by mouth daily.         . RABEprazole (ACIPHEX) 20 MG tablet   Oral   Take 20 mg by mouth daily.         Marland Kitchen ROXICET 5-325 MG per tablet   Oral   Take 1-2 tablets by mouth every 6 (six) hours as needed for severe pain.   30 tablet   0     Dispense as written.   . torsemide (DEMADEX) 20 MG tablet   Oral   Take 20 mg by mouth daily.           Allergies Review of patient's allergies indicates no known allergies.  No family history on file.  Social History Social History  Substance Use Topics  . Smoking status: Never Smoker   . Smokeless tobacco: None  . Alcohol Use: No    Review of Systems Constitutional: No fever/chills Eyes:  No visual changes. Cardiovascular: Denies chest pain. Respiratory: Denies shortness of breath. Gastrointestinal: No abdominal pain. Positive nausea and vomiting with medication only  No diarrhea.  No constipation. Genitourinary: Negative for dysuria. Musculoskeletal: Negative for back pain. Skin: Negative for rash. Neurological: Negative for headaches, focal weakness or numbness.  10-point ROS otherwise negative.  ____________________________________________   PHYSICAL EXAM:  VITAL SIGNS: ED Triage Vitals  Enc Vitals Group     BP 06/18/15 1452 129/75 mmHg     Pulse Rate 06/18/15 1452 82     Resp 06/18/15 1452 18     Temp 06/18/15 1452 97.8 F (36.6 C)     Temp Source 06/18/15 1452 Oral     SpO2 06/18/15 1452 96 %     Weight 06/18/15 1452 255 lb (115.667 kg)     Height 06/18/15 1452 5\' 3"  (1.6 m)     Head Cir --      Peak Flow --      Pain Score 06/18/15 1452  10     Pain Loc --      Pain Edu? --      Excl. in [place]? --     Constitutional: Alert and oriented. Well appearing and in no acute distress. Eyes: Conjunctivae are normal. PERRL. EOMI. Head: Atraumatic. Nose: No congestion/rhinnorhea. Neck: No stridor.   Cardiovascular: Normal rate, regular rhythm. Grossly normal heart sounds.  Good peripheral circulation. Respiratory: Normal respiratory effort.  No retractions. Lungs CTAB. Gastrointestinal: Soft and nontender. No distention, obese. Musculoskeletal: No lower extremity tenderness nor edema.  No joint effusions. Arm was not fully visible due to postsurgical immobilization Neurologic:  Normal speech and language. No gross focal neurologic deficits are appreciated. No gait instability. Skin:  Skin is warm, dry and intact. No rash noted. Psychiatric: Mood and affect are normal. Speech and behavior are normal.  ____________________________________________   LABS (all labs ordered are listed, but only abnormal results are displayed)  Labs Reviewed - No data to display  PROCEDURES  Procedure(s) performed: None  Critical Care performed: No  ____________________________________________   INITIAL IMPRESSION / ASSESSMENT AND PLAN / ED COURSE  Pertinent labs & imaging results that were available during my care of the patient were reviewed by me and considered in my medical decision making (see chart for details).   Patient was started on Norco 2 tablets while in the emergency room and given a prescription for same. She is to keep her point with her orthopedist. ____________________________________________   FINAL CLINICAL IMPRESSION(S) / ED DIAGNOSES  Final diagnoses:  Encounter for medication refill  Acute post-operative pain      Johnn Hai, PA-C 06/18/15 1619  Delman Kitten, MD 06/19/15 0002

## 2015-06-18 NOTE — ED Notes (Addendum)
Pt states Dr Jefm Bryant also wrote her a prescription for 4mg  Zofran ODT to help with nausea related to oxycodone but it has not helped.

## 2015-06-18 NOTE — Discharge Instructions (Signed)
Medication Refill, Emergency Department We have refilled your medication today as a courtesy to you. It is best for your medical care, however, to take care of getting refills done through your primary caregiver's office. They have your records and can do a better job of follow-up than we can in the emergency department. On maintenance medications, we often only prescribe enough medications to get you by until you are able to see your regular caregiver. This is a more expensive way to refill medications. In the future, please plan for refills so that you will not have to use the emergency department for this. Thank you for your help. Your help allows [place] to better take care of the daily emergencies that enter our department. Document Released: 01/17/2004 Document Revised: 12/23/2011 Document Reviewed: 01/07/2014 [facility] Patient Information 2015 [place], [place]. This information is not intended to replace advice given to you by your health care provider. Make sure you discuss any questions you have with your health care provider.  Pain Relief Preoperatively and Postoperatively Being a good patient does not mean being a silent one.If you have questions, problems, or concerns about the pain you may feel after surgery, let your caregiver know.Patients have the right to assessment and management of pain. The treatment of pain after surgery is important to speed up recovery and return to normal activities. Severe pain after surgery, and the fear or anxiety associated with that pain, may cause extreme discomfort that:  Prevents sleep.  Decreases the ability to breathe deeply and cough. This can cause pneumonia or other upper airway infections.  Causes your heart to beat faster and your blood pressure to be higher.  Increases the risk for constipation and bloating.  Decreases the ability of wounds to heal.  May result in depression, increased anxiety, and feelings of helplessness. Relief of pain  before surgery is also important because it will lessen the pain after surgery. Patients who receive both pain relief before and after surgery experience greater pain relief than those who only receive pain relief after surgery. Let your caregiver know if you are having uncontrolled pain.This is very important.Pain after surgery is more difficult to manage if it is permitted to become severe, so prompt and adequate treatment of acute pain is necessary. PAIN CONTROL METHODS Your caregivers follow policies and procedures about the management of patient pain.These guidelines should be explained to you before surgery.Plans for pain control after surgery must be mutually decided upon and instituted with your full understanding and agreement.Do not be afraid to ask questions regarding the care you are receiving.There are many different ways your caregivers will attempt to control your pain, including the following methods. As needed pain control  You may be given pain medicine either through your intravenous (IV) tube, or as a pill or liquid you can swallow. You will need to let your caregiver know when you are having pain. Then, your caregiver will give you the pain medicine ordered for you.  Your pain medicine may make you constipated. If constipation occurs, drink more liquids if you can. Your caregiver may have you take a mild laxative. IV patient-controlled analgesia pump (PCA pump)  You can get your pain medicine through the IV tube which goes into your vein. You are able to control the amount of pain medicine that you get. The pain medicine flows in through an IV tube and is controlled by a pump. This pump gives you a set amount of pain medicine when you push the button hooked  up to it. Nobody should push this button but you or someone specifically assigned by you to do so. It is set up to keep you from accidentally giving yourself too much pain medicine. You will be able to start using your pain  pump in the recovery room after your surgery. This method can be helpful for most types of surgery.  If you are still having too much pain, tell your caregiver. Also, tell your caregiver if you are feeling too sleepy or nauseous. Continuous epidural pain control  A thin, soft tube (catheter) is put into your back. Pain medicine flows through the catheter to lessen pain in the part of your body where the surgery is done. Continuous epidural pain control may work best for you if you are having surgery on your chest, abdomen, hip area, or legs. The epidural catheter is usually put into your back just before surgery. The catheter is left in until you can eat and take medicine by mouth. In most cases, this may take 2 to 3 days.  Giving pain medicine through the epidural catheter may help you heal faster because:  Your bowel gets back to normal faster.  You can get back to eating sooner.  You can be up and walking sooner. Medicine that numbs the area (local anesthetic)  You may receive an injection of pain medicine near where the pain is (local infiltration).  You may receive an injection of pain medicine near the nerve that controls the sensation to a specific part of the body (peripheral nerve block).  Medicine may be put in the spine to block pain (spinal block). Opioids  Moderate to moderately severe acute pain after surgery may respond to opioids.Opioids are narcotic pain medicine. Opioids are often combined with non-narcotic medicines to improve pain relief, diminish the risk of side effects, and reduce the chance of addiction.  If you follow your caregiver's directions about taking opioids and you do not have a history of substance abuse, your risk of becoming addicted is exceptionally small.Opioids are given for short periods of time in careful doses to prevent addiction. Other methods of pain control include:  Steroids.  Physical therapy.  Heat and cold therapy.  Compression,  such as wrapping an elastic bandage around the area of pain.  Massage. These various ways of controlling pain may be used together. Combining different methods of pain control is called multimodal analgesia. Using this approach has many benefits, including being able to eat, move around, and leave the hospital sooner. Document Released: 12/21/2002 Document Revised: 12/23/2011 Document Reviewed: 12/25/2010 [facility] Patient Information 2015 [place], [place]. This information is not intended to replace advice given to you by your health care provider. Make sure you discuss any questions you have with your health care provider.

## 2015-06-22 DIAGNOSIS — Z9889 Other specified postprocedural states: Secondary | ICD-10-CM | POA: Insufficient documentation

## 2015-06-22 DIAGNOSIS — S62346A Nondisplaced fracture of base of fifth metacarpal bone, right hand, initial encounter for closed fracture: Secondary | ICD-10-CM | POA: Insufficient documentation

## 2015-07-03 ENCOUNTER — Ambulatory Visit (INDEPENDENT_AMBULATORY_CARE_PROVIDER_SITE_OTHER): Payer: 59 | Admitting: Family Medicine

## 2015-07-03 ENCOUNTER — Encounter (INDEPENDENT_AMBULATORY_CARE_PROVIDER_SITE_OTHER): Payer: Self-pay

## 2015-07-03 ENCOUNTER — Ambulatory Visit: Payer: 59 | Admitting: Family Medicine

## 2015-07-03 ENCOUNTER — Encounter: Payer: Self-pay | Admitting: Family Medicine

## 2015-07-03 VITALS — BP 120/80 | HR 90 | Temp 97.9°F | Ht 64.0 in | Wt 263.0 lb

## 2015-07-03 DIAGNOSIS — F331 Major depressive disorder, recurrent, moderate: Secondary | ICD-10-CM | POA: Diagnosis not present

## 2015-07-03 NOTE — Progress Notes (Signed)
Pre visit review using our clinic review tool, if applicable. No additional management support is needed unless otherwise documented below in the visit note. 

## 2015-07-03 NOTE — Patient Instructions (Signed)
Nice to meet you. Please call the therapist to set up an appointment.  We will check on the cheek swab testing for antidepressant. If we can not order this we will start you on medication for depression. If you feel as though you are going to hurt yourself or someone else please seek medical attention.

## 2015-07-04 ENCOUNTER — Telehealth: Payer: Self-pay | Admitting: *Deleted

## 2015-07-04 NOTE — Telephone Encounter (Signed)
Pt called states she was to believe she was to have a Rx sent to pharmacy.  Please advise

## 2015-07-05 ENCOUNTER — Telehealth: Payer: Self-pay | Admitting: *Deleted

## 2015-07-05 NOTE — Telephone Encounter (Signed)
Patient had requested a medication refill for diethylpropion HCI . -thanks

## 2015-07-05 NOTE — Telephone Encounter (Signed)
Last OV 9.19.16.  Please advise refill

## 2015-07-05 NOTE — Telephone Encounter (Deleted)
Patient has

## 2015-07-06 ENCOUNTER — Encounter: Payer: Self-pay | Admitting: Family Medicine

## 2015-07-06 DIAGNOSIS — F331 Major depressive disorder, recurrent, moderate: Secondary | ICD-10-CM | POA: Insufficient documentation

## 2015-07-06 MED ORDER — FLUOXETINE HCL 20 MG PO TABS: 20.0000 mg | ORAL_TABLET | Freq: Every day | ORAL | Status: DC

## 2015-07-06 NOTE — Telephone Encounter (Signed)
We did not discuss weight loss at the patients office visit. We discussed her depression. Could you please ask the patient how long she has been on this medication. Who has been prescribing it. When the last time she took this medication. What dose she has been taking. I would also like for her to come back in to the office to discuss her weight further to determine the need for this medication.Thanks.

## 2015-07-06 NOTE — Telephone Encounter (Signed)
Was to send in antidepressant. Will send in prozac for the patient.

## 2015-07-06 NOTE — Assessment & Plan Note (Addendum)
Patient with long history of depression. Has lots of life stressors for a long time. Not currently on treatment for this. PHQ 15. Single episode of SI, though no intent or plan or recurrent thoughts. No HI. Discussed testing for sensitivity to SSRIs with cheek swab, unsure if we have access to this test. Patient would like to determine if this is a test that could be ordered prior to starting on medication. Will check on this and if it can't be ordered will start on prozac. Given therapist information. Given return precautions.

## 2015-07-06 NOTE — Telephone Encounter (Signed)
Tried to call patient, no VM set up.
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
## 2015-07-07 NOTE — Telephone Encounter (Signed)
Have tried to call patient three times on different days. Patient has no voicemail set up

## 2015-07-07 NOTE — Telephone Encounter (Signed)
Have tried to call patient three times and patient has no voicemail set up

## 2015-07-07 NOTE — Telephone Encounter (Signed)
Have tried to call the patient again with no answer and no voicemail set up. Upon her request faxed her a medical release form for her to sign and fax back on 07/06/15 so we could get her medical records. I have not received this back as of today.

## 2015-07-18 ENCOUNTER — Telehealth: Payer: Self-pay | Admitting: Family Medicine

## 2015-07-18 DIAGNOSIS — F329 Major depressive disorder, single episode, unspecified: Secondary | ICD-10-CM

## 2015-07-18 DIAGNOSIS — F32A Depression, unspecified: Secondary | ICD-10-CM

## 2015-07-18 NOTE — Telephone Encounter (Signed)
Pt called about her appt with the psychiatrist, but pt states when she called to confirm her appt she states that she was told she does not have an appt. Pt does not remember the doctor name and she cannot find the paper that was given. I looked in her chart and it does not state who the doctor is or the location. Pt needs assistance. I did call [place] and was given a number to the behavorial health but no one answered. Thank You!

## 2015-07-18 NOTE — Telephone Encounter (Signed)
Melissa can you assist with this?

## 2015-07-18 NOTE — Telephone Encounter (Signed)
Dr. Caryl Bis, can she have a referral for psych. ?

## 2015-07-18 NOTE — Telephone Encounter (Signed)
There are no referrals in the system for psychiatry. If pt needs a referral it will need to be ordered.

## 2015-07-18 NOTE — Telephone Encounter (Signed)
I gave her information for a psychologist for therapy. She was supposed to call them to set up an appointment. I did not initially refer her to psychiatry, though if she wants a referral I can place this for her. I will have to check on the contact information for the psychologist when I am back in the office tomorrow.

## 2015-07-19 ENCOUNTER — Other Ambulatory Visit: Payer: Self-pay | Admitting: Family Medicine

## 2015-07-19 NOTE — Telephone Encounter (Signed)
Referral has already been placed!  Thanks.

## 2015-07-19 NOTE — Telephone Encounter (Signed)
Yes please place referral for patient.  Thanks

## 2015-07-24 ENCOUNTER — Ambulatory Visit: Payer: 59 | Admitting: Family Medicine

## 2015-07-24 DIAGNOSIS — Z0289 Encounter for other administrative examinations: Secondary | ICD-10-CM

## 2015-10-10 DIAGNOSIS — Z90710 Acquired absence of both cervix and uterus: Secondary | ICD-10-CM | POA: Diagnosis not present

## 2015-10-10 DIAGNOSIS — E669 Obesity, unspecified: Secondary | ICD-10-CM | POA: Diagnosis not present

## 2015-10-10 DIAGNOSIS — M170 Bilateral primary osteoarthritis of knee: Secondary | ICD-10-CM | POA: Diagnosis not present

## 2015-10-10 DIAGNOSIS — K219 Gastro-esophageal reflux disease without esophagitis: Secondary | ICD-10-CM | POA: Diagnosis not present

## 2015-10-10 DIAGNOSIS — K222 Esophageal obstruction: Secondary | ICD-10-CM | POA: Diagnosis not present

## 2015-10-10 DIAGNOSIS — Z8679 Personal history of other diseases of the circulatory system: Secondary | ICD-10-CM | POA: Diagnosis not present

## 2015-10-10 DIAGNOSIS — I10 Essential (primary) hypertension: Secondary | ICD-10-CM | POA: Diagnosis not present

## 2015-10-10 DIAGNOSIS — Z79899 Other long term (current) drug therapy: Secondary | ICD-10-CM | POA: Diagnosis not present

## 2015-10-10 DIAGNOSIS — G4730 Sleep apnea, unspecified: Secondary | ICD-10-CM | POA: Diagnosis not present

## 2015-10-10 DIAGNOSIS — K449 Diaphragmatic hernia without obstruction or gangrene: Secondary | ICD-10-CM | POA: Diagnosis not present

## 2015-10-10 DIAGNOSIS — R1310 Dysphagia, unspecified: Secondary | ICD-10-CM | POA: Diagnosis present

## 2015-10-11 ENCOUNTER — Encounter
Admission: RE | Disposition: A | Discharge status: Home / Self Care | Payer: Self-pay | Source: Ambulatory Visit | Attending: Unknown Physician Specialty

## 2015-10-11 ENCOUNTER — Ambulatory Visit: Payer: 59 | Admitting: Anesthesiology

## 2015-10-11 ENCOUNTER — Ambulatory Visit
Admission: RE | Admit: 2015-10-11 | Discharge: 2015-10-11 | Disposition: A | Discharge status: Home / Self Care | Payer: 59 | Source: Ambulatory Visit | Attending: Unknown Physician Specialty | Admitting: Unknown Physician Specialty

## 2015-10-11 ENCOUNTER — Encounter: Payer: Self-pay | Admitting: *Deleted

## 2015-10-11 DIAGNOSIS — K449 Diaphragmatic hernia without obstruction or gangrene: Secondary | ICD-10-CM | POA: Insufficient documentation

## 2015-10-11 DIAGNOSIS — Z8679 Personal history of other diseases of the circulatory system: Secondary | ICD-10-CM | POA: Insufficient documentation

## 2015-10-11 DIAGNOSIS — K222 Esophageal obstruction: Secondary | ICD-10-CM | POA: Insufficient documentation

## 2015-10-11 DIAGNOSIS — G4730 Sleep apnea, unspecified: Secondary | ICD-10-CM | POA: Insufficient documentation

## 2015-10-11 DIAGNOSIS — M170 Bilateral primary osteoarthritis of knee: Secondary | ICD-10-CM | POA: Insufficient documentation

## 2015-10-11 DIAGNOSIS — K219 Gastro-esophageal reflux disease without esophagitis: Secondary | ICD-10-CM | POA: Insufficient documentation

## 2015-10-11 DIAGNOSIS — R1310 Dysphagia, unspecified: Secondary | ICD-10-CM | POA: Insufficient documentation

## 2015-10-11 DIAGNOSIS — Z79899 Other long term (current) drug therapy: Secondary | ICD-10-CM | POA: Insufficient documentation

## 2015-10-11 DIAGNOSIS — I10 Essential (primary) hypertension: Secondary | ICD-10-CM | POA: Insufficient documentation

## 2015-10-11 DIAGNOSIS — Z90710 Acquired absence of both cervix and uterus: Secondary | ICD-10-CM | POA: Insufficient documentation

## 2015-10-11 DIAGNOSIS — E669 Obesity, unspecified: Secondary | ICD-10-CM | POA: Insufficient documentation

## 2015-10-11 HISTORY — DX: Obesity, unspecified: E66.9

## 2015-10-11 HISTORY — PX: SAVORY DILATION: SHX5439

## 2015-10-11 HISTORY — DX: Angina pectoris, unspecified: I20.9

## 2015-10-11 HISTORY — PX: ESOPHAGOGASTRODUODENOSCOPY (EGD) WITH PROPOFOL: SHX5813

## 2015-10-11 HISTORY — DX: Diaphragmatic hernia without obstruction or gangrene: K44.9

## 2015-10-11 HISTORY — DX: Peripheral vascular disease, unspecified: I73.9

## 2015-10-11 SURGERY — ESOPHAGOGASTRODUODENOSCOPY (EGD) WITH PROPOFOL
Anesthesia: General

## 2015-10-11 MED ORDER — PROPOFOL 500 MG/50ML IV EMUL
INTRAVENOUS | Status: DC | PRN
  Administered 2015-10-11: 150 ug/kg/min via INTRAVENOUS

## 2015-10-11 MED ORDER — SODIUM CHLORIDE 0.9 % IV SOLN
INTRAVENOUS | Status: DC
  Administered 2015-10-11: 13:00:00 via INTRAVENOUS

## 2015-10-11 MED ORDER — SODIUM CHLORIDE 0.9 % IV SOLN: INTRAVENOUS | Status: DC

## 2015-10-11 MED ORDER — LIDOCAINE HCL (CARDIAC) 20 MG/ML IV SOLN
INTRAVENOUS | Status: DC | PRN
  Administered 2015-10-11: 100 mg via INTRAVENOUS

## 2015-10-11 MED ORDER — PROPOFOL 10 MG/ML IV BOLUS
INTRAVENOUS | Status: DC | PRN
  Administered 2015-10-11: 120 mg via INTRAVENOUS

## 2015-10-11 NOTE — Transfer of Care (Signed)
Immediate Anesthesia Transfer of Care Note  Patient: Jacqueline Delgado  Procedure(s) Performed: Procedure(s): ESOPHAGOGASTRODUODENOSCOPY (EGD) WITH PROPOFOL (N/A) SAVORY DILATION (N/A)  Patient Location: Endoscopy Unit  Anesthesia Type:General  Level of Consciousness: sedated  Airway & Oxygen Therapy: Patient Spontanous Breathing and Patient connected to nasal cannula oxygen  Post-op Assessment: Report given to RN and Post -op Vital signs reviewed and stable  Post vital signs: Reviewed and stable  Last Vitals:  Filed Vitals:   10/11/15 1254  BP: 147/91  Pulse: 91  Temp: 36.4 C  Resp: 18    Complications: No apparent anesthesia complications

## 2015-10-11 NOTE — Op Note (Signed)
[facility] Gastroenterology Patient Name: Jacqueline Delgado Procedure Date: 10/11/2015 1:15 PM MRN: [id number] Account #: [id number] Date of Birth: Jun 16, 1951 Admit Type: Outpatient Age: 64 Room: [facility] ENDO ROOM 4 Gender: Female Note Status: Finalized Procedure:         Upper GI endoscopy Indications:       Dysphagia Providers:         Manya Silvas, MD Referring MD:      Reyes Ivan, MD (Referring MD) Medicines:         Propofol per Anesthesia Complications:     No immediate complications. Procedure:         Pre-Anesthesia Assessment:                    - After reviewing the risks and benefits, the patient was                     deemed in satisfactory condition to undergo the procedure.                    After obtaining informed consent, the endoscope was passed                     under direct vision. Throughout the procedure, the                     patient's blood pressure, pulse, and oxygen saturations                     were monitored continuously. The Endoscope was introduced                     through the mouth, and advanced to the second part of                     duodenum. The upper GI endoscopy was accomplished without                     difficulty. The patient tolerated the procedure well. Findings:      A small hiatus hernia was present. GEJ 36cm. Some spasm seen in distal       esophagus.      A mild Schatzki ring (acquired) was found at the gastroesophageal       junction. A guidewire was placed and the scope was withdrawn. Dilation       was performed with a Savary dilator with mild resistance at 17 mm.      The stomach was otherwise normal.      The examined duodenum was normal. Impression:        - Small hiatus hernia.                    - Mild Schatzki ring. Dilated.                    - Normal stomach.                    - Normal examined duodenum.                    - No specimens collected. Recommendation:    - soft food for 3 days,  eat slowly, chew well, take small  bites Manya Silvas, MD 10/11/2015 1:25:32 PM This report has been signed electronically. Number of Addenda: 0 Note Initiated On: 10/11/2015 1:15 PM      [facility]

## 2015-10-11 NOTE — H&P (Signed)
Primary Care Physician:  Lynnell Jude, MD Primary Gastroenterologist:  Dr. Vira Agar  Pre-Procedure History & Physical: HPI:  Jacqueline Delgado is a 64 y.o. female is here for an endoscopy.   Past Medical History  Diagnosis Date  . Arthritis     Osteoarthritis in BLE knee  . Hypertension     controlled  . Reflux   . GERD (gastroesophageal reflux disease)   . History of hiatal hernia   . Sleep apnea     NO CPAP  . Colon polyp   . Hiatal hernia   . Anginal pain ([place])   . Obesity   . Small vessel disease [facility])     Past Surgical History  Procedure Laterality Date  . Joint replacement Right     knee, Oct 2012  . Joint replacement      hopefully getting left partial knee replacement in July 2016  . Cholecystectomy    . Abdominal hysterectomy      partial  . Shoulder arthroscopy with rotator cuff repair and subacromial decompression Right 06/14/2015    Procedure: SHOULDER ARTHROSCOPY WITH mini open ROTATOR CUFF REPAIR AND SUBACROMIAL DECOMPRESSION, release long head biceps tendon.;  Surgeon: Leanor Kail, MD;  Location: [facility] ORS;  Service: Orthopedics;  Laterality: Right;    Prior to Admission medications   Medication Sig Start Date End Date Taking? Authorizing Provider  torsemide (DEMADEX) 20 MG tablet Take 1 tablet by mouth two  times daily 07/19/15  Yes Ashok Norris, MD  diazepam (VALIUM) 5 MG tablet Take 5 mg by mouth every 8 (eight) hours as needed for anxiety.    Historical Provider, MD  Diethylpropion HCl 25 MG TABS Take by mouth.    Historical Provider, MD  FLUoxetine (PROZAC) 20 MG tablet Take 1 tablet (20 mg total) by mouth daily. Patient not taking: Reported on 10/11/2015 07/06/15   Leone Haven, MD  HYDROcodone-acetaminophen (NORCO/VICODIN) 5-325 MG per tablet Take 1-2 tablets by mouth every 4 (four) hours as needed for moderate pain. 06/18/15   Johnn Hai, PA-C  naproxen (NAPROSYN) 500 MG tablet Take 500 mg by mouth at bedtime as needed.    Historical  Provider, MD  potassium chloride SA (K-DUR,KLOR-CON) 20 MEQ tablet Take 20 mEq by mouth daily.    Historical Provider, MD  RABEprazole (ACIPHEX) 20 MG tablet Take 20 mg by mouth daily.    Historical Provider, MD  ROXICET 5-325 MG per tablet Take 1-2 tablets by mouth every 6 (six) hours as needed for severe pain. Patient not taking: Reported on 07/03/2015 06/14/15   Leanor Kail, MD  sucralfate (CARAFATE) 1 G tablet Take 1 g by mouth 4 (four) times daily -  with meals and at bedtime. Reported on 10/11/2015    Historical Provider, MD    Allergies as of 09/21/2015  . (No Known Allergies)    Family History  Problem Relation Age of Onset  . Alcoholism      brother  . Arthritis      parent  . Breast cancer      mother and grandmother  . Hyperlipidemia Father   . Heart disease Father   . Stroke Father   . Hypertension Father   . Diabetes      parent and other family member    Social History   Social History  . Marital Status: Single    Spouse Name: N/A  . Number of Children: N/A  . Years of Education: N/A   Occupational History  .  Not on file.   Social History Main Topics  . Smoking status: Never Smoker   . Smokeless tobacco: Not on file  . Alcohol Use: No  . Drug Use: No  . Sexual Activity: Not on file   Other Topics Concern  . Not on file   Social History Narrative    Review of Systems: See HPI, otherwise negative ROS  Physical Exam: BP 147/91 mmHg  Pulse 91  Temp(Src) 97.6 F (36.4 C) (Tympanic)  Resp 18  Ht 5\' 4"  (1.626 m)  Wt 117.482 kg (259 lb)  BMI 44.44 kg/m2  SpO2 99% General:   Alert,  pleasant and cooperative in NAD Head:  Normocephalic and atraumatic. Neck:  Supple; no masses or thyromegaly. Lungs:  Clear throughout to auscultation.    Heart:  Regular rate and rhythm. Abdomen:  Soft, nontender and nondistended. Normal bowel sounds, without guarding, and without rebound.   Neurologic:  Alert and  oriented x4;  grossly normal  neurologically.  Impression/Plan: Jacqueline Delgado is here for an endoscopy to be performed for dysphagia  Risks, benefits, limitations, and alternatives regarding  endoscopy have been reviewed with the patient.  Questions have been answered.  All parties agreeable.   Gaylyn Cheers, MD  10/11/2015, 1:11 PM

## 2015-10-11 NOTE — Anesthesia Preprocedure Evaluation (Signed)
Anesthesia Evaluation  Patient identified by MRN, date of birth, ID band Patient awake    Reviewed: Allergy & Precautions, H&P , NPO status , Patient's Chart, lab work & pertinent test results  History of Anesthesia Complications Negative for: history of anesthetic complications  Airway Mallampati: II  TM Distance: >3 FB Neck ROM: full    Dental  (+) Poor Dentition, Chipped, Missing   Pulmonary neg shortness of breath, sleep apnea ,    Pulmonary exam normal breath sounds clear to auscultation       Cardiovascular Exercise Tolerance: Good hypertension, (-) angina(-) Past MI and (-) DOE Normal cardiovascular exam Rhythm:regular Rate:Normal     Neuro/Psych PSYCHIATRIC DISORDERS Depression  Neuromuscular disease    GI/Hepatic Neg liver ROS, hiatal hernia, GERD  ,  Endo/Other  negative endocrine ROS  Renal/GU negative Renal ROS  negative genitourinary   Musculoskeletal  (+) Arthritis ,   Abdominal   Peds  Hematology negative hematology ROS (+)   Anesthesia Other Findings Past Medical History:   Arthritis                                                      Comment:Osteoarthritis in BLE knee   Hypertension                                                   Comment:controlled   Reflux                                                       GERD (gastroesophageal reflux disease)                       History of hiatal hernia                                     Sleep apnea                                                    Comment:NO CPAP   Colon polyp                                                  Hiatal hernia                                                Anginal pain ([place])  Obesity                                                      Small vessel disease ([place])                                  Past Surgical History:   JOINT REPLACEMENT                               Right                 Comment:knee, Oct 2012   JOINT REPLACEMENT                                               Comment:hopefully getting left partial knee replacement              in July 2016   CHOLECYSTECTOMY                                               ABDOMINAL HYSTERECTOMY                                          Comment:partial   SHOULDER ARTHROSCOPY WITH ROTATOR CUFF REPAIR * Right 06/14/2015      Comment:Procedure: SHOULDER ARTHROSCOPY WITH mini open               ROTATOR CUFF REPAIR AND SUBACROMIAL               DECOMPRESSION, release long head biceps               tendon.;  Surgeon: Leanor Kail, MD;                Location: [facility] ORS;  Service: Orthopedics;                Laterality: Right;  BMI    Body Mass Index   44.43 kg/m 2      Reproductive/Obstetrics negative OB ROS                             Anesthesia Physical Anesthesia Plan  ASA: III  Anesthesia Plan: General   Post-op Pain Management:    Induction:   Airway Management Planned:   Additional Equipment:   Intra-op Plan:   Post-operative Plan:   Informed Consent: I have reviewed the patients History and Physical, chart, labs and discussed the procedure including the risks, benefits and alternatives for the proposed anesthesia with the patient or authorized representative who has indicated his/her understanding and acceptance.   Dental Advisory Given  Plan Discussed with: Anesthesiologist, CRNA and Surgeon  Anesthesia Plan Comments:         Anesthesia Quick Evaluation

## 2015-10-11 NOTE — Anesthesia Postprocedure Evaluation (Signed)
Anesthesia Post Note  Patient: BIANNCA SHIRAISHI  Procedure(s) Performed: Procedure(s) (LRB): ESOPHAGOGASTRODUODENOSCOPY (EGD) WITH PROPOFOL (N/A) SAVORY DILATION (N/A)  Patient location during evaluation: Endoscopy Anesthesia Type: General Level of consciousness: awake and alert Pain management: pain level controlled Vital Signs Assessment: post-procedure vital signs reviewed and stable Respiratory status: spontaneous breathing, nonlabored ventilation, respiratory function stable and patient connected to nasal cannula oxygen Cardiovascular status: blood pressure returned to baseline and stable Postop Assessment: no signs of nausea or vomiting Anesthetic complications: no    Last Vitals:  Filed Vitals:   10/11/15 1347 10/11/15 1357  BP: 148/82 164/74  Pulse: 63 57  Temp:    Resp: 18 19    Last Pain: There were no vitals filed for this visit.               Precious Haws Jag Lenz

## 2015-10-26 ENCOUNTER — Other Ambulatory Visit: Payer: Self-pay | Admitting: Family Medicine

## 2015-10-26 DIAGNOSIS — Z1231 Encounter for screening mammogram for malignant neoplasm of breast: Secondary | ICD-10-CM

## 2015-10-30 ENCOUNTER — Ambulatory Visit: Payer: 59

## 2015-11-23 DIAGNOSIS — M6530 Trigger finger, unspecified finger: Secondary | ICD-10-CM | POA: Insufficient documentation
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
## 2016-03-08 ENCOUNTER — Other Ambulatory Visit: Payer: Self-pay | Admitting: Otolaryngology

## 2016-03-08 DIAGNOSIS — H9311 Tinnitus, right ear: Secondary | ICD-10-CM

## 2016-03-08 DIAGNOSIS — R51 Headache: Secondary | ICD-10-CM

## 2016-03-08 DIAGNOSIS — Z8679 Personal history of other diseases of the circulatory system: Secondary | ICD-10-CM

## 2016-03-08 DIAGNOSIS — R42 Dizziness and giddiness: Secondary | ICD-10-CM

## 2016-03-08 DIAGNOSIS — R519 Headache, unspecified: Secondary | ICD-10-CM

## 2016-03-28 ENCOUNTER — Ambulatory Visit: Payer: 59

## 2016-03-28 ENCOUNTER — Ambulatory Visit: Payer: Self-pay

## 2016-04-18 ENCOUNTER — Ambulatory Visit
Admission: RE | Admit: 2016-04-18 | Discharge: 2016-04-18 | Disposition: A | Discharge status: Home / Self Care | Payer: 59 | Source: Ambulatory Visit | Attending: Otolaryngology | Admitting: Otolaryngology

## 2016-04-18 ENCOUNTER — Ambulatory Visit: Payer: 59

## 2016-04-18 DIAGNOSIS — R42 Dizziness and giddiness: Secondary | ICD-10-CM | POA: Diagnosis present

## 2016-04-18 DIAGNOSIS — Z8679 Personal history of other diseases of the circulatory system: Secondary | ICD-10-CM | POA: Insufficient documentation

## 2016-04-18 DIAGNOSIS — G939 Disorder of brain, unspecified: Secondary | ICD-10-CM | POA: Insufficient documentation

## 2016-04-18 DIAGNOSIS — R51 Headache: Secondary | ICD-10-CM | POA: Diagnosis present

## 2016-04-18 DIAGNOSIS — H9311 Tinnitus, right ear: Secondary | ICD-10-CM | POA: Diagnosis present

## 2016-04-18 DIAGNOSIS — R519 Headache, unspecified: Secondary | ICD-10-CM

## 2016-04-18 LAB — POCT I-STAT CREATININE: CREATININE: 0.9 mg/dL (ref 0.44–1.00)

## 2016-04-18 MED ORDER — GADOBENATE DIMEGLUMINE 529 MG/ML IV SOLN
20.0000 mL | Freq: Once | INTRAVENOUS | Status: AC | PRN
  Administered 2016-04-18: 20 mL via INTRAVENOUS

## 2016-04-23 DIAGNOSIS — G4733 Obstructive sleep apnea (adult) (pediatric): Secondary | ICD-10-CM | POA: Insufficient documentation

## 2016-04-29 ENCOUNTER — Other Ambulatory Visit: Payer: Self-pay | Admitting: Specialist

## 2016-05-02 ENCOUNTER — Ambulatory Visit (INDEPENDENT_AMBULATORY_CARE_PROVIDER_SITE_OTHER): Payer: 59 | Admitting: Podiatry

## 2016-05-02 ENCOUNTER — Ambulatory Visit (INDEPENDENT_AMBULATORY_CARE_PROVIDER_SITE_OTHER): Payer: 59

## 2016-05-02 ENCOUNTER — Encounter: Payer: Self-pay | Admitting: Podiatry

## 2016-05-02 VITALS — BP 131/97 | HR 91 | Resp 18

## 2016-05-02 DIAGNOSIS — R52 Pain, unspecified: Secondary | ICD-10-CM

## 2016-05-02 DIAGNOSIS — M722 Plantar fascial fibromatosis: Secondary | ICD-10-CM

## 2016-05-02 NOTE — Patient Instructions (Signed)

## 2016-05-02 NOTE — Progress Notes (Signed)
   Subjective:    Patient ID: Jacqueline Delgado, female    DOB: December 26, 1950, 65 y.o.   MRN: [id number]  HPI  65 year-old female presents the also concerns of bilateral heel pain which has been ongoing the last couple weeks of the right side worse than left. She states that she has pain in the morning or after standing all day at work. Denies any numbness or tingling. Denies any swelling or redness. The pain does not wake her up at night. No recent treatment. No other complaints at this time.   Review of Systems  All other systems reviewed and are negative.      Objective:   Physical Exam General: AAO x3, NAD  Dermatological: Skin is warm, dry and supple bilateral. Nails x 10 are well manicured; remaining integument appears unremarkable at this time. There are no open sores, no preulcerative lesions, no rash or signs of infection present.  Vascular: Dorsalis Pedis artery and Posterior Tibial artery pedal pulses are 2/4 bilateral with immedate capillary fill time. Pedal hair growth present. There is no pain with calf compression, swelling, warmth, erythema.   Neruologic: Grossly intact via light touch bilateral. Vibratory intact via tuning fork bilateral. Protective threshold with Semmes Wienstein monofilament intact to all pedal sites bilateral.   Musculoskeletal: Tenderness to palpation along the plantar medial tubercle of the calcaneus at the insertion of plantar fascia on the right > left foot. There is no pain along the course of the plantar fascia within the arch of the foot. Plantar fascia appears to be intact. There is no pain with lateral compression of the calcaneus or pain with vibratory sensation. There is no pain along the course or insertion of the achilles tendon. No other areas of tenderness to bilateral lower extremities. MMT 5/5, ROM WNL.    Gait: Unassisted, Nonantalgic.      Assessment & Plan:  65 year old female bilateral heel pain, likely plantar fasciitis -Treatment  options discussed including all alternatives, risks, and complications -Etiology of symptoms were discussed -X-rays were obtained and reviewed with the patient. No evidence of acute fracture or stress fracture -Patient elects to proceed with steroid injection into the left and right plantar heel. Under sterile skin preparation, a total of 2.5cc of kenalog 10, 0.5% Marcaine plain, and 2% lidocaine plain were infiltrated into the symptomatic area without complication. A band-aid was applied. Patient tolerated the injection well without complication. Post-injection care with discussed with the patient. Discussed with the patient to ice the area over the next couple of days to help prevent a steroid flare.  -Plantar fascial braces -She has meloxicam at home and recommended to take this. Discussed side effects. She did not want any prescription today. -Stretching exercises daily -Ice. -Discussed shoe modifications and orthotics -Follow-up in 3 weeks or sooner if any problems arise. In the meantime, encouraged to call the office with any questions, concerns, change in symptoms.   Celesta Gentile, DPM

## 2016-05-03 DIAGNOSIS — M722 Plantar fascial fibromatosis: Secondary | ICD-10-CM | POA: Insufficient documentation

## 2016-05-23 ENCOUNTER — Ambulatory Visit: Payer: 59 | Admitting: Podiatry

## 2016-05-30 ENCOUNTER — Ambulatory Visit: Payer: 59 | Admitting: Podiatry

## 2016-06-04 ENCOUNTER — Encounter: Payer: Self-pay | Admitting: Podiatry

## 2016-06-04 ENCOUNTER — Ambulatory Visit (INDEPENDENT_AMBULATORY_CARE_PROVIDER_SITE_OTHER): Payer: 59 | Admitting: Podiatry

## 2016-06-04 DIAGNOSIS — M722 Plantar fascial fibromatosis: Secondary | ICD-10-CM | POA: Diagnosis not present

## 2016-06-04 MED ORDER — METHYLPREDNISOLONE 4 MG PO TBPK: ORAL_TABLET | ORAL | 0 refills | Status: DC

## 2016-06-04 MED ORDER — TRAMADOL HCL 50 MG PO TABS: 50.0000 mg | ORAL_TABLET | Freq: Two times a day (BID) | ORAL | 0 refills | Status: DC | PRN

## 2016-06-04 NOTE — Patient Instructions (Signed)
Do not take meloxicam with medrol dose pack (steroid)

## 2016-06-09 NOTE — Progress Notes (Signed)
Subjective: Jacqueline Delgado presents to the office today for follow-up evaluation of right heel pain. She states that she is somewhat improved but she still does get pain in the right heel. She's been stretching icing as much as she can. They have been icing, stretching, try to wear supportive shoe as much as possible. No other complaints at this time. No acute changes since last appointment. They deny any systemic complaints such as fevers, chills, nausea, vomiting.  Objective: General: AAO x3, NAD  Dermatological: Skin is warm, dry and supple bilateral. Nails x 10 are well manicured; remaining integument appears unremarkable at this time. There are no open sores, no preulcerative lesions, no rash or signs of infection present.  Vascular: Dorsalis Pedis artery and Posterior Tibial artery pedal pulses are 2/4 bilateral with immedate capillary fill time. Pedal hair growth present. There is no pain with calf compression, swelling, warmth, erythema.   Neruologic: Grossly intact via light touch bilateral. Vibratory intact via tuning fork bilateral. Protective threshold with Semmes Wienstein monofilament intact to all pedal sites bilateral.   Musculoskeletal: There is continued tenderness palpation along the plantar medial tubercle of the calcaneus at the insertion of the plantar fascia on the right foot. There is no pain along the course of the plantar fascia within the arch of the foot. Plantar fascia appears to be intact bilaterally. There is no pain with lateral compression of the calcaneus and there is no pain with vibratory sensation. There is no pain along the course or insertion of the Achilles tendon. There are no other areas of tenderness to bilateral lower extremities. No gross boney pedal deformities bilateral. No pain, crepitus, or limitation noted with foot and ankle range of motion bilateral. Muscular strength 5/5 in all groups tested bilateral.  Gait: Unassisted, Nonantalgic.    Assessment: Presents for follow-up evaluation for heel pain, likely plantar fasciitis   Plan: -Treatment options discussed including all alternatives, risks, and complications -Patient elects to proceed with steroid injection into the right heel. Under sterile skin preparation, a total of 2.5cc of kenalog 10, 0.5% Marcaine plain, and 2% lidocaine plain were infiltrated into the symptomatic area without complication. A band-aid was applied. Patient tolerated the injection well without complication. Post-injection care with discussed with the patient. Discussed with the patient to ice the area over the next couple of days to help prevent a steroid flare.  -Order Medrol Dosepak. Once this is completely can restart anti-inflammatories but did not the other. -Continue stretching and icing exercises daily. -Continue supportive shoe gear. Discussed orthtoics -Follow-up as scheduled or sooner if any problems arise. In the meantime, encouraged to call the office with any questions, concerns, change in symptoms.   Celesta Gentile, DPM

## 2016-06-11 ENCOUNTER — Other Ambulatory Visit: Payer: Self-pay | Admitting: Neurology

## 2016-06-11 DIAGNOSIS — H9311 Tinnitus, right ear: Secondary | ICD-10-CM

## 2016-06-11 DIAGNOSIS — R413 Other amnesia: Secondary | ICD-10-CM

## 2016-06-19 ENCOUNTER — Ambulatory Visit: Payer: 59

## 2016-06-24 ENCOUNTER — Ambulatory Visit
Admission: RE | Admit: 2016-06-24 | Discharge: 2016-06-24 | Disposition: A | Discharge status: Home / Self Care | Payer: 59 | Source: Ambulatory Visit | Attending: Neurology | Admitting: Neurology

## 2016-06-24 DIAGNOSIS — H9311 Tinnitus, right ear: Secondary | ICD-10-CM | POA: Insufficient documentation

## 2016-06-24 DIAGNOSIS — R413 Other amnesia: Secondary | ICD-10-CM | POA: Diagnosis present

## 2016-06-24 DIAGNOSIS — J349 Unspecified disorder of nose and nasal sinuses: Secondary | ICD-10-CM | POA: Insufficient documentation

## 2016-07-04 ENCOUNTER — Ambulatory Visit: Payer: 59 | Admitting: Podiatry

## 2016-08-07 ENCOUNTER — Other Ambulatory Visit: Payer: Self-pay | Admitting: Family Medicine

## 2016-08-07 ENCOUNTER — Ambulatory Visit
Admission: RE | Admit: 2016-08-07 | Discharge: 2016-08-07 | Disposition: A | Discharge status: Home / Self Care | Payer: 59 | Source: Ambulatory Visit | Attending: Family Medicine | Admitting: Family Medicine

## 2016-08-07 DIAGNOSIS — Z1231 Encounter for screening mammogram for malignant neoplasm of breast: Secondary | ICD-10-CM

## 2016-10-08 ENCOUNTER — Encounter: Payer: Self-pay | Admitting: Emergency Medicine

## 2016-10-08 ENCOUNTER — Emergency Department: Payer: 59

## 2016-10-08 ENCOUNTER — Emergency Department
Admission: EM | Admit: 2016-10-08 | Discharge: 2016-10-08 | Disposition: A | Discharge status: Home / Self Care | Payer: 59 | Attending: Emergency Medicine | Admitting: Emergency Medicine

## 2016-10-08 DIAGNOSIS — T50905A Adverse effect of unspecified drugs, medicaments and biological substances, initial encounter: Secondary | ICD-10-CM

## 2016-10-08 DIAGNOSIS — Z79899 Other long term (current) drug therapy: Secondary | ICD-10-CM | POA: Diagnosis not present

## 2016-10-08 DIAGNOSIS — T505X5A Adverse effect of appetite depressants, initial encounter: Secondary | ICD-10-CM | POA: Diagnosis not present

## 2016-10-08 DIAGNOSIS — R079 Chest pain, unspecified: Secondary | ICD-10-CM | POA: Diagnosis present

## 2016-10-08 DIAGNOSIS — R110 Nausea: Secondary | ICD-10-CM | POA: Insufficient documentation

## 2016-10-08 DIAGNOSIS — I10 Essential (primary) hypertension: Secondary | ICD-10-CM | POA: Insufficient documentation

## 2016-10-08 LAB — BASIC METABOLIC PANEL
Anion gap: 7 (ref 5–15)
BUN: 13 mg/dL (ref 6–20)
CALCIUM: 8.8 mg/dL — AB (ref 8.9–10.3)
CHLORIDE: 106 mmol/L (ref 101–111)
CO2: 28 mmol/L (ref 22–32)
CREATININE: 1.07 mg/dL — AB (ref 0.44–1.00)
GFR calc Af Amer: >60 mL/min (ref >60)
GFR calc non Af Amer: 54 mL/min — ABNORMAL LOW (ref >60)
Glucose, Bld: 114 mg/dL — ABNORMAL HIGH (ref 65–99)
Potassium: 3.3 mmol/L — ABNORMAL LOW (ref 3.5–5.1)
SODIUM: 141 mmol/L (ref 135–145)

## 2016-10-08 LAB — CBC
HCT: 36.2 % (ref 35.0–47.0)
Hemoglobin: 11.7 g/dL — ABNORMAL LOW (ref 12.0–16.0)
MCH: 25.8 pg — AB (ref 26.0–34.0)
MCHC: 32.5 g/dL (ref 32.0–36.0)
MCV: 79.3 fL — AB (ref 80.0–100.0)
PLATELETS: 236 10*3/uL (ref 150–440)
RBC: 4.56 MIL/uL (ref 3.80–5.20)
RDW: 16.3 % — AB (ref 11.5–14.5)
WBC: 4.2 10*3/uL (ref 3.6–11.0)

## 2016-10-08 LAB — TROPONIN I: Troponin I: <0.03 ng/mL (ref <0.03)

## 2016-10-08 MED ORDER — ONDANSETRON HCL 4 MG PO TABS: 4.0000 mg | ORAL_TABLET | Freq: Every day | ORAL | 1 refills | Status: DC | PRN

## 2016-10-08 NOTE — ED Triage Notes (Signed)
Pt started on phentermine and wellbutrin last week. Pt reports chest pain since last night and diarrhea, now reports feeling shaky and headache. Pt A/O in triage, ambulatory to triage.

## 2016-10-08 NOTE — ED Provider Notes (Signed)
[facility] Emergency Department Provider Note        Time seen: ----------------------------------------- 3:56 PM on 10/08/2016 -----------------------------------------    I have reviewed the triage vital signs and the nursing notes.   HISTORY  Chief Complaint Chest Pain    HPI Jacqueline Delgado is a 65 y.o. female who presents to the ER for chest pain last night with diarrhea and nausea. She now feels shaky and has had somewhat of a headache. She reports she started phentermine and Wellbutrin last week. She arrives alert and oriented, has a mild history of hypertension and a family history of heart disease otherwise denies heart disease risks. Nothing is made her symptoms better.   Past Medical History:  Diagnosis Date  . Anginal pain ([place])   . Arthritis    Osteoarthritis in BLE knee  . Colon polyp   . GERD (gastroesophageal reflux disease)   . Hiatal hernia   . History of hiatal hernia   . Hypertension    controlled  . Obesity   . Reflux   . Sleep apnea    NO CPAP  . Small vessel disease     Patient Active Problem List   Diagnosis Date Noted  . Plantar fasciitis 05/03/2016  . Depression, major, recurrent, moderate ([place]) 07/06/2015    Past Surgical History:  Procedure Laterality Date  . ABDOMINAL HYSTERECTOMY     partial  . CHOLECYSTECTOMY    . ESOPHAGOGASTRODUODENOSCOPY (EGD) WITH PROPOFOL N/A 10/11/2015   Procedure: ESOPHAGOGASTRODUODENOSCOPY (EGD) WITH PROPOFOL;  Surgeon: Manya Silvas, MD;  Location: [facility] ENDOSCOPY;  Service: Endoscopy;  Laterality: N/A;  . JOINT REPLACEMENT Right    knee, Oct 2012  . JOINT REPLACEMENT     hopefully getting left partial knee replacement in July 2016  . SAVORY DILATION N/A 10/11/2015   Procedure: SAVORY DILATION;  Surgeon: Manya Silvas, MD;  Location: [facility] ENDOSCOPY;  Service: Endoscopy;  Laterality: N/A;  . SHOULDER ARTHROSCOPY WITH ROTATOR CUFF REPAIR AND SUBACROMIAL DECOMPRESSION Right  06/14/2015   Procedure: SHOULDER ARTHROSCOPY WITH mini open ROTATOR CUFF REPAIR AND SUBACROMIAL DECOMPRESSION, release long head biceps tendon.;  Surgeon: Leanor Kail, MD;  Location: [facility] ORS;  Service: Orthopedics;  Laterality: Right;    Allergies Ibuprofen  Social History Social History  Substance Use Topics  . Smoking status: Never Smoker  . Smokeless tobacco: Not on file  . Alcohol use No    Review of Systems Constitutional: Negative for fever. Cardiovascular: Positive for chest pain Respiratory: Negative for shortness of breath. Gastrointestinal: Negative for abdominal pain, positive for nausea and diarrhea Skin: Negative for rash. Neurological: Positive for weakness and headache  10-point ROS otherwise negative.  ____________________________________________   PHYSICAL EXAM:  VITAL SIGNS: ED Triage Vitals [10/08/16 1258]  Enc Vitals Group     BP (!) 154/81     Pulse Rate 79     Resp 16     Temp 98 F (36.7 C)     Temp Source Oral     SpO2 100 %     Weight 261 lb (118.4 kg)     Height 5\' 4"  (1.626 m)     Head Circumference      Peak Flow      Pain Score 5     Pain Loc      Pain Edu?      Excl. in [place]?     Constitutional: Alert and oriented. Well appearing and in no distress. Eyes: Conjunctivae are normal. PERRL. Normal  extraocular movements. ENT   Head: Normocephalic and atraumatic.   Nose: No congestion/rhinnorhea.   Mouth/Throat: Mucous membranes are moist.   Neck: No stridor. Cardiovascular: Normal rate, regular rhythm. No murmurs, rubs, or gallops. Respiratory: Normal respiratory effort without tachypnea nor retractions. Breath sounds are clear and equal bilaterally. No wheezes/rales/rhonchi. Gastrointestinal: Soft and nontender. Normal bowel sounds Musculoskeletal: Nontender with normal range of motion in all extremities. No lower extremity tenderness nor edema. Neurologic:  Normal speech and language. No gross focal neurologic  deficits are appreciated.  Skin:  Skin is warm, dry and intact. No rash noted. Psychiatric: Mood and affect are normal. Speech and behavior are normal.  ____________________________________________  EKG: Interpreted by me.Normal sinus rhythm rate 85 bpm, normal PR interval, normal QRS size, normal QT, leftward axis.  ____________________________________________  ED COURSE:  Pertinent labs & imaging results that were available during my care of the patient were reviewed by me and considered in my medical decision making (see chart for details). Clinical Course   Patient presents to ER for chest pain as well as several other symptoms. We will assess with labs and imaging.  Procedures ____________________________________________   LABS (pertinent positives/negatives)  Labs Reviewed  BASIC METABOLIC PANEL - Abnormal; Notable for the following:       Result Value   Potassium 3.3 (*)    Glucose, Bld 114 (*)    Creatinine, Ser 1.07 (*)    Calcium 8.8 (*)    GFR calc non Af Amer 54 (*)    All other components within normal limits  CBC - Abnormal; Notable for the following:    Hemoglobin 11.7 (*)    MCV 79.3 (*)    MCH 25.8 (*)    RDW 16.3 (*)    All other components within normal limits  TROPONIN I    RADIOLOGY Chest x-ray  IMPRESSION: No edema or consolidation.  There is aortic atherosclerosis.   ____________________________________________  FINAL ASSESSMENT AND PLAN  Chest pain, medication side effect  Plan: Patient with labs and imaging as dictated above. Patient is in no distress, heart score is low risk for ACS. Labs here are unremarkable. She likely is having symptoms from some of her medications. I have advised stopping the phentermine as well as her estrogen and torsemide. She will be advised to have close outpatient follow-up with cardiology. She is agreeable to plan.   Earleen Newport, MD   Note: This dictation was prepared with Dragon dictation. Any  transcriptional errors that result from this process are unintentional    Earleen Newport, MD 10/08/16 1558

## 2016-10-08 NOTE — ED Notes (Signed)
Pt offered wheelchair, denies.

## 2016-10-08 NOTE — ED Notes (Signed)
See triage note, pt ambulatory to room with ease. CP last night that has subsided today. Pt does report feeling "shaky". New medications started last week. Pt alert and oriented X4, active, cooperative, pt in NAD. RR even and unlabored, color WNL.

## 2017-02-25 ENCOUNTER — Encounter: Payer: Self-pay | Admitting: Emergency Medicine

## 2017-02-25 ENCOUNTER — Emergency Department
Admission: EM | Admit: 2017-02-25 | Discharge: 2017-02-25 | Disposition: A | Discharge status: Home / Self Care | Payer: 59 | Attending: Emergency Medicine | Admitting: Emergency Medicine

## 2017-02-25 ENCOUNTER — Emergency Department: Payer: 59

## 2017-02-25 DIAGNOSIS — Z79899 Other long term (current) drug therapy: Secondary | ICD-10-CM | POA: Insufficient documentation

## 2017-02-25 DIAGNOSIS — R05 Cough: Secondary | ICD-10-CM | POA: Diagnosis present

## 2017-02-25 DIAGNOSIS — I10 Essential (primary) hypertension: Secondary | ICD-10-CM | POA: Insufficient documentation

## 2017-02-25 DIAGNOSIS — J40 Bronchitis, not specified as acute or chronic: Secondary | ICD-10-CM | POA: Insufficient documentation

## 2017-02-25 DIAGNOSIS — K2960 Other gastritis without bleeding: Secondary | ICD-10-CM | POA: Diagnosis not present

## 2017-02-25 LAB — CBC WITH DIFFERENTIAL/PLATELET
BASOS PCT: 1 %
Basophils Absolute: 0.1 10*3/uL (ref 0–0.1)
EOS PCT: 7 %
Eosinophils Absolute: 0.4 10*3/uL (ref 0–0.7)
HEMATOCRIT: 35.1 % (ref 35.0–47.0)
Hemoglobin: 11.4 g/dL — ABNORMAL LOW (ref 12.0–16.0)
Lymphocytes Relative: 19 %
Lymphs Abs: 1.1 10*3/uL (ref 1.0–3.6)
MCH: 25.5 pg — ABNORMAL LOW (ref 26.0–34.0)
MCHC: 32.6 g/dL (ref 32.0–36.0)
MCV: 78.1 fL — ABNORMAL LOW (ref 80.0–100.0)
MONO ABS: 0.6 10*3/uL (ref 0.2–0.9)
MONOS PCT: 10 %
NEUTROS ABS: 3.6 10*3/uL (ref 1.4–6.5)
Neutrophils Relative %: 63 %
Platelets: 224 10*3/uL (ref 150–440)
RBC: 4.49 MIL/uL (ref 3.80–5.20)
RDW: 15.8 % — AB (ref 11.5–14.5)
WBC: 5.7 10*3/uL (ref 3.6–11.0)

## 2017-02-25 LAB — COMPREHENSIVE METABOLIC PANEL
ALBUMIN: 3.6 g/dL (ref 3.5–5.0)
ALK PHOS: 117 U/L (ref 38–126)
ALT: 20 U/L (ref 14–54)
ANION GAP: 8 (ref 5–15)
AST: 19 U/L (ref 15–41)
BILIRUBIN TOTAL: 0.5 mg/dL (ref 0.3–1.2)
BUN: 15 mg/dL (ref 6–20)
CALCIUM: 9.2 mg/dL (ref 8.9–10.3)
CO2: 29 mmol/L (ref 22–32)
Chloride: 103 mmol/L (ref 101–111)
Creatinine, Ser: 0.9 mg/dL (ref 0.44–1.00)
GLUCOSE: 90 mg/dL (ref 65–99)
Potassium: 3.4 mmol/L — ABNORMAL LOW (ref 3.5–5.1)
Sodium: 140 mmol/L (ref 135–145)
TOTAL PROTEIN: 7.4 g/dL (ref 6.5–8.1)

## 2017-02-25 LAB — POCT RAPID STREP A: Streptococcus, Group A Screen (Direct): NEGATIVE

## 2017-02-25 LAB — TROPONIN I

## 2017-02-25 MED ORDER — ALBUTEROL SULFATE HFA 108 (90 BASE) MCG/ACT IN AERS: 1.0000 | INHALATION_SPRAY | RESPIRATORY_TRACT | 0 refills | Status: DC | PRN

## 2017-02-25 MED ORDER — GI COCKTAIL ~~LOC~~
30.0000 mL | Freq: Once | ORAL | Status: AC
  Administered 2017-02-25: 30 mL via ORAL
  Filled 2017-02-25: qty 30

## 2017-02-25 MED ORDER — IPRATROPIUM BROMIDE 0.02 % IN SOLN
0.5000 mg | Freq: Once | RESPIRATORY_TRACT | Status: AC
  Administered 2017-02-25: 0.5 mg via RESPIRATORY_TRACT
  Filled 2017-02-25: qty 2.5

## 2017-02-25 MED ORDER — SODIUM CHLORIDE 0.9 % IV BOLUS (SEPSIS)
1000.0000 mL | Freq: Once | INTRAVENOUS | Status: AC
  Administered 2017-02-25: 1000 mL via INTRAVENOUS

## 2017-02-25 MED ORDER — PANTOPRAZOLE SODIUM 40 MG IV SOLR
40.0000 mg | Freq: Once | INTRAVENOUS | Status: AC
  Administered 2017-02-25: 40 mg via INTRAVENOUS
  Filled 2017-02-25: qty 40

## 2017-02-25 MED ORDER — ALBUTEROL SULFATE (2.5 MG/3ML) 0.083% IN NEBU
5.0000 mg | INHALATION_SOLUTION | Freq: Once | RESPIRATORY_TRACT | Status: AC
  Administered 2017-02-25: 5 mg via RESPIRATORY_TRACT
  Filled 2017-02-25: qty 6

## 2017-02-25 MED ORDER — ESOMEPRAZOLE MAGNESIUM 40 MG PO CPDR: 40.0000 mg | DELAYED_RELEASE_CAPSULE | Freq: Every day | ORAL | 0 refills | Status: DC

## 2017-02-25 NOTE — ED Triage Notes (Signed)
Pt c/o coughing x 5 days accompanied by sore throat, reports no relief from coughing. Non productive cough. Respirations even and unlabored, speaking in complete sentences.

## 2017-02-25 NOTE — ED Provider Notes (Signed)
[place] Provider Note   CSN: [id number] Arrival date & time: 02/25/17  0137     History   Chief Complaint Chief Complaint  Patient presents with  . Cough    HPI Jacqueline Delgado is a 66 y.o. female hx of arthritis, GERD, HTN, Here presenting with sore throat, cough. Patient states that she began coughing for the last 4 days. States that she feels that her reflux was worse especially at night. She had a acid feeling in the back of her throat. Also the cough is nonproductive and no fevers at home. Patient states that she takes AcipHex but is not currently on any Nexium or Protonix. Patient was noted to be hypertensive and states that she only takes torsemide for hypertension. Denies any chest pain or abdominal pain.  The history is provided by the patient.    Past Medical History:  Diagnosis Date  . Anginal pain ([place])   . Arthritis    Osteoarthritis in BLE knee  . Colon polyp   . GERD (gastroesophageal reflux disease)   . Hiatal hernia   . History of hiatal hernia   . Hypertension    controlled  . Obesity   . Reflux   . Sleep apnea    NO CPAP  . Small vessel disease     Patient Active Problem List   Diagnosis Date Noted  . Plantar fasciitis 05/03/2016  . Depression, major, recurrent, moderate ([place]) 07/06/2015    Past Surgical History:  Procedure Laterality Date  . ABDOMINAL HYSTERECTOMY     partial  . CHOLECYSTECTOMY    . ESOPHAGOGASTRODUODENOSCOPY (EGD) WITH PROPOFOL N/A 10/11/2015   Procedure: ESOPHAGOGASTRODUODENOSCOPY (EGD) WITH PROPOFOL;  Surgeon: Manya Silvas, MD;  Location: [facility] ENDOSCOPY;  Service: Endoscopy;  Laterality: N/A;  . JOINT REPLACEMENT Right    knee, Oct 2012  . JOINT REPLACEMENT     hopefully getting left partial knee replacement in July 2016  . SAVORY DILATION N/A 10/11/2015   Procedure: SAVORY DILATION;  Surgeon: Manya Silvas, MD;  Location: [facility] ENDOSCOPY;  Service: Endoscopy;  Laterality: N/A;  . SHOULDER  ARTHROSCOPY WITH ROTATOR CUFF REPAIR AND SUBACROMIAL DECOMPRESSION Right 06/14/2015   Procedure: SHOULDER ARTHROSCOPY WITH mini open ROTATOR CUFF REPAIR AND SUBACROMIAL DECOMPRESSION, release long head biceps tendon.;  Surgeon: Leanor Kail, MD;  Location: [facility] ORS;  Service: Orthopedics;  Laterality: Right;    OB History    No data available       Home Medications    Prior to Admission medications   Medication Sig Start Date End Date Taking? Authorizing Provider  diazepam (VALIUM) 5 MG tablet Take 5 mg by mouth every 8 (eight) hours as needed for anxiety. Reported on 05/02/2016    [provider]  Diethylpropion HCl 25 MG TABS Take by mouth. Reported on 05/02/2016    [provider]  FLUoxetine (PROZAC) 20 MG tablet Take 1 tablet (20 mg total) by mouth daily. Patient not taking: Reported on 10/11/2015 07/06/15   Leone Haven, MD  HYDROcodone-acetaminophen (NORCO/VICODIN) 5-325 MG per tablet Take 1-2 tablets by mouth every 4 (four) hours as needed for moderate pain. Patient not taking: Reported on 05/02/2016 06/18/15   Johnn Hai, PA-C  methylPREDNISolone (MEDROL DOSEPAK) 4 MG TBPK tablet Take as directed 06/04/16   Trula Slade, DPM  naproxen (NAPROSYN) 500 MG tablet Take 500 mg by mouth at bedtime as needed. Reported on 05/02/2016    [provider]  ondansetron (ZOFRAN) 4 MG tablet  Take 1 tablet (4 mg total) by mouth daily as needed for nausea or vomiting. 10/08/16   Earleen Newport, MD  potassium chloride SA (K-DUR,KLOR-CON) 20 MEQ tablet Take 20 mEq by mouth daily. Reported on 05/02/2016    [provider]  RABEprazole (ACIPHEX) 20 MG tablet Take 20 mg by mouth daily. Reported on 05/02/2016    [provider]  ROXICET 5-325 MG per tablet Take 1-2 tablets by mouth every 6 (six) hours as needed for severe pain. Patient not taking: Reported on 07/03/2015 06/14/15   Leanor Kail, MD  sucralfate (CARAFATE) 1 G tablet Take 1 g  by mouth 4 (four) times daily -  with meals and at bedtime. Reported on 05/02/2016    [provider]  torsemide (DEMADEX) 20 MG tablet Take 1 tablet by mouth two  times daily 07/19/15   Ashok Norris, MD  traMADol (ULTRAM) 50 MG tablet Take 1 tablet (50 mg total) by mouth every 12 (twelve) hours as needed. 06/04/16   Trula Slade, DPM    Family History Family History  Problem Relation Age of Onset  . Alcoholism Unknown        brother  . Arthritis Unknown        parent  . Breast cancer Unknown        mother and grandmother  . Hyperlipidemia Father   . Heart disease Father   . Stroke Father   . Hypertension Father   . Diabetes Unknown        parent and other family member  . Breast cancer Mother 29  . Breast cancer Maternal Grandmother        young    Social History Social History  Substance Use Topics  . Smoking status: Never Smoker  . Smokeless tobacco: Never Used  . Alcohol use No     Allergies   Ibuprofen   Review of Systems Review of Systems  HENT: Positive for sore throat.   Respiratory: Positive for cough.   All other systems reviewed and are negative.    Physical Exam Updated Vital Signs BP (!) 158/93   Pulse 98   Temp 98.7 F (37.1 C) (Oral)   Resp (!) 21   Ht 5\' 4"  (1.626 m)   Wt 262 lb (118.8 kg)   SpO2 98%   BMI 44.97 kg/m   Physical Exam  Constitutional: She is oriented to person, place, and time.  Uncomfortable, coughing   HENT:  Head: Normocephalic.  OP slightly red, tonsils not enlarged. TM nl bilaterally   Eyes: EOM are normal. Pupils are equal, round, and reactive to light.  Neck: Normal range of motion. Neck supple.  No obvious cervical LAD   Cardiovascular: Normal rate, regular rhythm and normal heart sounds.   Pulmonary/Chest: Effort normal and breath sounds normal. No respiratory distress. She has no wheezes. She has no rales.  Abdominal: Soft. Bowel sounds are normal. She exhibits no distension. There is no  tenderness.  Musculoskeletal: Normal range of motion.  Neurological: She is alert and oriented to person, place, and time. No cranial nerve deficit. Coordination normal.  Skin: Skin is warm.  Psychiatric: She has a normal mood and affect.  Nursing note and vitals reviewed.    ED Treatments / Results  Labs (all labs ordered are listed, but only abnormal results are displayed) Labs Reviewed  CBC WITH DIFFERENTIAL/PLATELET - Abnormal; Notable for the following:       Result Value   Hemoglobin 11.4 (*)  MCV 78.1 (*)    MCH 25.5 (*)    RDW 15.8 (*)    All other components within normal limits  COMPREHENSIVE METABOLIC PANEL - Abnormal; Notable for the following:    Potassium 3.4 (*)    All other components within normal limits  CULTURE, GROUP A STREP [facility])  TROPONIN I  POCT RAPID STREP A    EKG  EKG Interpretation None      ED ECG REPORT I, Wandra Arthurs, the attending physician, personally viewed and interpreted this ECG.   Date: 02/25/2017  EKG Time: 7:48 am  Rate: 81  Rhythm: normal EKG, normal sinus rhythm  Axis: normal  Intervals:none  ST&T Change: nonspecific    Radiology Dg Chest 2 View  Result Date: 02/25/2017 CLINICAL DATA:  Cough and congestion for 5 days. EXAM: CHEST  2 VIEW COMPARISON:  Radiographs 10/08/2016 FINDINGS: Unchanged heart size and mediastinal contours. Heart at the upper limits normal in size with mild aortic tortuosity. No pulmonary edema. Minimal lingular scarring. No consolidation to suggest pneumonia. No pleural fluid or pneumothorax. No acute osseous abnormalities. IMPRESSION: No acute abnormality. Electronically Signed   By: Jeb Levering M.D.   On: 02/25/2017 02:39    Procedures Procedures (including critical care time)  Medications Ordered in ED Medications  gi cocktail (Maalox,Lidocaine,Donnatal) (30 mLs Oral Given 02/25/17 0817)  sodium chloride 0.9 % bolus 1,000 mL (1,000 mLs Intravenous New Bag/Given 02/25/17 0803)    pantoprazole (PROTONIX) injection 40 mg (40 mg Intravenous Given 02/25/17 0809)  albuterol (PROVENTIL) (2.5 MG/3ML) 0.083% nebulizer solution 5 mg (5 mg Nebulization Given 02/25/17 0806)  ipratropium (ATROVENT) nebulizer solution 0.5 mg (0.5 mg Nebulization Given 02/25/17 0806)     Initial Impression / Assessment and Plan / ED Course  I have reviewed the triage vital signs and the nursing notes.  Pertinent labs & imaging results that were available during my care of the patient were reviewed by me and considered in my medical decision making (see chart for details).      Jacqueline Delgado is a 66 y.o. female here with sore throat, cough, reflux. Likely bronchitis vs worsening reflux. Patient hypertensive 170s in the ED, no chest pain. Will get labs, CXR, rapid strep. Will give protonix and GI cocktail and reassess.   9:52 AM Labs unremarkable. CXR clear. Felt better with GI cocktail, protonix. Minimal wheezing after albuterol now. Likely bronchitis, will dc home with albuterol, nexium. BP 158/93, labs unremarkable, will have her see PCP for follow up.    Final Clinical Impressions(s) / ED Diagnoses   Final diagnoses:  None    New Prescriptions New Prescriptions   No medications on file     Drenda Freeze, MD 02/25/17 [phone]

## 2017-02-25 NOTE — Discharge Instructions (Signed)
Take nexium daily for reflux.   Use albuterol every 4-6 hrs as needed for cough.   See your doctor in a week to recheck blood pressure.   Return to ER if you have worse cough, trouble breathing, chest pain, reflux, vomiting, fevers.

## 2017-02-27 LAB — CULTURE, GROUP A STREP (THRC)

## 2017-07-29 ENCOUNTER — Ambulatory Visit: Payer: Self-pay | Admitting: Podiatry

## 2017-10-12 ENCOUNTER — Encounter: Payer: Self-pay | Admitting: Radiology

## 2017-10-12 ENCOUNTER — Emergency Department
Admission: EM | Admit: 2017-10-12 | Discharge: 2017-10-12 | Disposition: A | Discharge status: Home / Self Care | Payer: 59 | Attending: Emergency Medicine | Admitting: Emergency Medicine

## 2017-10-12 ENCOUNTER — Emergency Department: Payer: 59

## 2017-10-12 ENCOUNTER — Other Ambulatory Visit: Payer: Self-pay

## 2017-10-12 DIAGNOSIS — Z96651 Presence of right artificial knee joint: Secondary | ICD-10-CM | POA: Diagnosis not present

## 2017-10-12 DIAGNOSIS — R52 Pain, unspecified: Secondary | ICD-10-CM

## 2017-10-12 DIAGNOSIS — R109 Unspecified abdominal pain: Secondary | ICD-10-CM | POA: Diagnosis present

## 2017-10-12 DIAGNOSIS — R1013 Epigastric pain: Secondary | ICD-10-CM | POA: Insufficient documentation

## 2017-10-12 DIAGNOSIS — Z79899 Other long term (current) drug therapy: Secondary | ICD-10-CM | POA: Insufficient documentation

## 2017-10-12 DIAGNOSIS — R110 Nausea: Secondary | ICD-10-CM | POA: Diagnosis not present

## 2017-10-12 DIAGNOSIS — I10 Essential (primary) hypertension: Secondary | ICD-10-CM | POA: Diagnosis not present

## 2017-10-12 LAB — CBC WITH DIFFERENTIAL/PLATELET
BASOS ABS: 0 10*3/uL (ref 0–0.1)
Basophils Relative: 1 %
EOS PCT: 4 %
Eosinophils Absolute: 0.2 10*3/uL (ref 0–0.7)
HCT: 33.1 % — ABNORMAL LOW (ref 35.0–47.0)
HEMOGLOBIN: 10.7 g/dL — AB (ref 12.0–16.0)
LYMPHS ABS: 1.5 10*3/uL (ref 1.0–3.6)
LYMPHS PCT: 34 %
MCH: 25.5 pg — AB (ref 26.0–34.0)
MCHC: 32.2 g/dL (ref 32.0–36.0)
MCV: 79.2 fL — ABNORMAL LOW (ref 80.0–100.0)
Monocytes Absolute: 0.5 10*3/uL (ref 0.2–0.9)
Monocytes Relative: 11 %
NEUTROS PCT: 50 %
Neutro Abs: 2.3 10*3/uL (ref 1.4–6.5)
PLATELETS: 229 10*3/uL (ref 150–440)
RBC: 4.18 MIL/uL (ref 3.80–5.20)
RDW: 15.1 % — ABNORMAL HIGH (ref 11.5–14.5)
WBC: 4.5 10*3/uL (ref 3.6–11.0)

## 2017-10-12 LAB — BASIC METABOLIC PANEL
ANION GAP: 8 (ref 5–15)
BUN: 17 mg/dL (ref 6–20)
CALCIUM: 9.1 mg/dL (ref 8.9–10.3)
CO2: 27 mmol/L (ref 22–32)
CREATININE: 1.07 mg/dL — AB (ref 0.44–1.00)
Chloride: 107 mmol/L (ref 101–111)
GFR, EST NON AFRICAN AMERICAN: 53 mL/min — AB (ref >60)
Glucose, Bld: 105 mg/dL — ABNORMAL HIGH (ref 65–99)
Potassium: 3.6 mmol/L (ref 3.5–5.1)
SODIUM: 142 mmol/L (ref 135–145)

## 2017-10-12 LAB — URINALYSIS, COMPLETE (UACMP) WITH MICROSCOPIC
BILIRUBIN URINE: NEGATIVE
GLUCOSE, UA: NEGATIVE mg/dL
HGB URINE DIPSTICK: NEGATIVE
KETONES UR: NEGATIVE mg/dL
NITRITE: NEGATIVE
PH: 6 (ref 5.0–8.0)
Protein, ur: 30 mg/dL — AB
SPECIFIC GRAVITY, URINE: 1.024 (ref 1.005–1.030)

## 2017-10-12 LAB — HEPATIC FUNCTION PANEL
ALT: 10 U/L — ABNORMAL LOW (ref 14–54)
AST: 16 U/L (ref 15–41)
Albumin: 3.5 g/dL (ref 3.5–5.0)
Alkaline Phosphatase: 112 U/L (ref 38–126)
BILIRUBIN TOTAL: 0.5 mg/dL (ref 0.3–1.2)
Total Protein: 7.3 g/dL (ref 6.5–8.1)

## 2017-10-12 LAB — LIPASE, BLOOD: LIPASE: 36 U/L (ref 11–51)

## 2017-10-12 LAB — TROPONIN I

## 2017-10-12 MED ORDER — IOPAMIDOL (ISOVUE-300) INJECTION 61%
100.0000 mL | Freq: Once | INTRAVENOUS | Status: AC | PRN
Start: 2017-10-12 — End: 2017-10-12
  Administered 2017-10-12: 100 mL via INTRAVENOUS

## 2017-10-12 MED ORDER — RANITIDINE HCL 150 MG PO TABS: 150.0000 mg | ORAL_TABLET | Freq: Two times a day (BID) | ORAL | 0 refills | Status: DC

## 2017-10-12 MED ORDER — GI COCKTAIL ~~LOC~~
30.0000 mL | Freq: Once | ORAL | Status: AC
  Administered 2017-10-12: 30 mL via ORAL

## 2017-10-12 MED ORDER — PROMETHAZINE HCL 12.5 MG PO TABS: 12.5000 mg | ORAL_TABLET | Freq: Four times a day (QID) | ORAL | 0 refills | Status: DC | PRN

## 2017-10-12 MED ORDER — ONDANSETRON HCL 4 MG/2ML IJ SOLN
4.0000 mg | Freq: Once | INTRAMUSCULAR | Status: AC
  Administered 2017-10-12: 4 mg via INTRAVENOUS
  Filled 2017-10-12: qty 2

## 2017-10-12 MED ORDER — MORPHINE SULFATE (PF) 4 MG/ML IV SOLN
4.0000 mg | Freq: Once | INTRAVENOUS | Status: AC
  Administered 2017-10-12: 4 mg via INTRAVENOUS
  Filled 2017-10-12: qty 1

## 2017-10-12 NOTE — ED Notes (Signed)
Pt back from CT

## 2017-10-12 NOTE — ED Provider Notes (Signed)
[facility] Emergency Department Provider Note  ____________________________________________   First MD Initiated Contact with Patient 10/12/17 [phone]     (approximate)  I have reviewed the triage vital signs and the nursing notes.   HISTORY  Chief Complaint Abdominal Pain   HPI Jacqueline Delgado is a 66 y.o. female who self presents the emergency department with 3 days of worsening abdominal pain.  She has had symptoms for roughly 1 month ever since Thanksgiving.  The pain is epigastric burning moderate severity aching radiating around her right side towards her back.  The pain became severe last night associated with nausea which prompted the visit.  She has a past surgical history of cholecystectomy and hysterectomy.  Her symptoms are not worsened by eating.  Tonight they were constant which prompted the visit.  She is never had an endoscopy but she does have a long-standing history of gastric reflux.  She does report some bitter taste in her mouth.  Past Medical History:  Diagnosis Date  . Anginal pain ([place])   . Arthritis    Osteoarthritis in BLE knee  . Colon polyp   . GERD (gastroesophageal reflux disease)   . Hiatal hernia   . History of hiatal hernia   . Hypertension    controlled  . Obesity   . Reflux   . Sleep apnea    NO CPAP  . Small vessel disease     Patient Active Problem List   Diagnosis Date Noted  . Plantar fasciitis 05/03/2016  . Depression, major, recurrent, moderate ([place]) 07/06/2015    Past Surgical History:  Procedure Laterality Date  . ABDOMINAL HYSTERECTOMY     partial  . CHOLECYSTECTOMY    . ESOPHAGOGASTRODUODENOSCOPY (EGD) WITH PROPOFOL N/A 10/11/2015   Procedure: ESOPHAGOGASTRODUODENOSCOPY (EGD) WITH PROPOFOL;  Surgeon: Manya Silvas, MD;  Location: [facility] ENDOSCOPY;  Service: Endoscopy;  Laterality: N/A;  . JOINT REPLACEMENT Right    knee, Oct 2012  . JOINT REPLACEMENT     hopefully getting left partial knee  replacement in July 2016  . SAVORY DILATION N/A 10/11/2015   Procedure: SAVORY DILATION;  Surgeon: Manya Silvas, MD;  Location: [facility] ENDOSCOPY;  Service: Endoscopy;  Laterality: N/A;  . SHOULDER ARTHROSCOPY WITH ROTATOR CUFF REPAIR AND SUBACROMIAL DECOMPRESSION Right 06/14/2015   Procedure: SHOULDER ARTHROSCOPY WITH mini open ROTATOR CUFF REPAIR AND SUBACROMIAL DECOMPRESSION, release long head biceps tendon.;  Surgeon: Leanor Kail, MD;  Location: [facility] ORS;  Service: Orthopedics;  Laterality: Right;    Prior to Admission medications   Medication Sig Start Date End Date Taking? Authorizing Provider  albuterol (PROVENTIL HFA;VENTOLIN HFA) 108 (90 Base) MCG/ACT inhaler Inhale 1-2 puffs into the lungs every 4 (four) hours as needed for wheezing or shortness of breath (cough). 02/25/17   Drenda Freeze, MD  diazepam (VALIUM) 5 MG tablet Take 5 mg by mouth every 8 (eight) hours as needed for anxiety. Reported on 05/02/2016    [provider]  Diethylpropion HCl 25 MG TABS Take by mouth. Reported on 05/02/2016    [provider]  esomeprazole (NEXIUM) 40 MG capsule Take 1 capsule (40 mg total) by mouth daily. 02/25/17 02/25/18  Drenda Freeze, MD  FLUoxetine (PROZAC) 20 MG tablet Take 1 tablet (20 mg total) by mouth daily. Patient not taking: Reported on 10/11/2015 07/06/15   Leone Haven, MD  HYDROcodone-acetaminophen (NORCO/VICODIN) 5-325 MG per tablet Take 1-2 tablets by mouth every 4 (four) hours as needed for moderate pain. Patient  not taking: Reported on 05/02/2016 06/18/15   Johnn Hai, PA-C  methylPREDNISolone (MEDROL DOSEPAK) 4 MG TBPK tablet Take as directed 06/04/16   Trula Slade, DPM  naproxen (NAPROSYN) 500 MG tablet Take 500 mg by mouth at bedtime as needed. Reported on 05/02/2016    [provider]  ondansetron (ZOFRAN) 4 MG tablet Take 1 tablet (4 mg total) by mouth daily as needed for nausea or vomiting. 10/08/16   Earleen Newport,  MD  potassium chloride SA (K-DUR,KLOR-CON) 20 MEQ tablet Take 20 mEq by mouth daily. Reported on 05/02/2016    [provider]  RABEprazole (ACIPHEX) 20 MG tablet Take 20 mg by mouth daily. Reported on 05/02/2016    [provider]  ROXICET 5-325 MG per tablet Take 1-2 tablets by mouth every 6 (six) hours as needed for severe pain. Patient not taking: Reported on 07/03/2015 06/14/15   Leanor Kail, MD  sucralfate (CARAFATE) 1 G tablet Take 1 g by mouth 4 (four) times daily -  with meals and at bedtime. Reported on 05/02/2016    [provider]  torsemide (DEMADEX) 20 MG tablet Take 1 tablet by mouth two  times daily 07/19/15   Ashok Norris, MD  traMADol (ULTRAM) 50 MG tablet Take 1 tablet (50 mg total) by mouth every 12 (twelve) hours as needed. 06/04/16   Trula Slade, DPM    Allergies Ibuprofen  Family History  Problem Relation Age of Onset  . Alcoholism Unknown        brother  . Arthritis Unknown        parent  . Breast cancer Unknown        mother and grandmother  . Hyperlipidemia Father   . Heart disease Father   . Stroke Father   . Hypertension Father   . Diabetes Unknown        parent and other family member  . Breast cancer Mother 34  . Breast cancer Maternal Grandmother        young    Social History Social History   Tobacco Use  . Smoking status: Never Smoker  . Smokeless tobacco: Never Used  Substance Use Topics  . Alcohol use: No  . Drug use: No    Review of Systems Constitutional: No fever/chills Eyes: No visual changes. ENT: No sore throat. Cardiovascular: Denies chest pain. Respiratory: Denies shortness of breath. Gastrointestinal: Positive for abdominal pain.  Positive for nausea, no vomiting.  No diarrhea.  No constipation. Genitourinary: Negative for dysuria. Musculoskeletal: Negative for back pain. Skin: Negative for rash. Neurological: Negative for headaches, focal weakness or  numbness.   ____________________________________________   PHYSICAL EXAM:  VITAL SIGNS: ED Triage Vitals  Enc Vitals Group     BP 10/12/17 0544 (!) 149/91     Pulse Rate 10/12/17 0544 83     Resp 10/12/17 0544 (!) 24     Temp 10/12/17 0544 98.5 F (36.9 C)     Temp Source 10/12/17 0544 Oral     SpO2 10/12/17 0544 96 %     Weight 10/12/17 0543 266 lb (120.7 kg)     Height 10/12/17 0543 5\' 4"  (1.626 m)     Head Circumference --      Peak Flow --      Pain Score 10/12/17 0542 8     Pain Loc --      Pain Edu? --      Excl. in [place]? --     Constitutional: Alert  and oriented x4 well-appearing nontoxic no diaphoresis speaks in full clear sentences Eyes: PERRL EOMI. Head: Atraumatic. Nose: No congestion/rhinnorhea. Mouth/Throat: No trismus Neck: No stridor.   Cardiovascular: Normal rate, regular rhythm. Grossly normal heart sounds.  Good peripheral circulation. Respiratory: Slightly increased respiratory effort.  No retractions. Lungs CTAB and moving good air Gastrointestinal: Soft abdomen obese mild diffuse tenderness with no focality no rebound or guarding no peritonitis Musculoskeletal: No lower extremity edema   Neurologic:  Normal speech and language. No gross focal neurologic deficits are appreciated. Skin:  Skin is warm, dry and intact. No rash noted. Psychiatric: Mood and affect are normal. Speech and behavior are normal.    ____________________________________________   DIFFERENTIAL includes but not limited to  Gastric reflux, appendicitis, diverticulitis, volvulus, small bowel obstruction ____________________________________________   LABS (all labs ordered are listed, but only abnormal results are displayed)  Labs Reviewed  CBC WITH DIFFERENTIAL/PLATELET - Abnormal; Notable for the following components:      Result Value   Hemoglobin 10.7 (*)    HCT 33.1 (*)    MCV 79.2 (*)    MCH 25.5 (*)    RDW 15.1 (*)    All other components within normal limits   BASIC METABOLIC PANEL  HEPATIC FUNCTION PANEL  LIPASE, BLOOD  TROPONIN I  URINALYSIS, COMPLETE (UACMP) WITH MICROSCOPIC    Blood work is pending __________________________________________  EKG  ED ECG REPORT I, Darel Hong, the attending physician, personally viewed and interpreted this ECG.  Date: 10/12/2017 EKG Time:  Rate: 79 Rhythm: normal sinus rhythm QRS Axis: normal Intervals: normal ST/T Wave abnormalities: normal Narrative Interpretation: no evidence of acute ischemia  ____________________________________________  RADIOLOGY  CT abdomen pelvis is pending ____________________________________________   PROCEDURES  Procedure(s) performed: no  Procedures  Critical Care performed: no  Observation: no ____________________________________________   INITIAL IMPRESSION / ASSESSMENT AND PLAN / ED COURSE  Pertinent labs & imaging results that were available during my care of the patient were reviewed by me and considered in my medical decision making (see chart for details).  Patient is hemodynamically stable and well-appearing with a relatively benign abdominal exam.  Unclear etiology of her epigastric pain but differential includes pancreatitis as well as infectious etiology.  We will treat her symptomatically and she requires a CT scan with IV contrast.  Care signed over to Dr. Quentin Cornwall pending results of CT scan.      ____________________________________________   FINAL CLINICAL IMPRESSION(S) / ED DIAGNOSES  Final diagnoses:  Pain      NEW MEDICATIONS STARTED DURING THIS VISIT:  This SmartLink is deprecated. Use AVSMEDLIST instead to display the medication list for a patient.   Note:  This document was prepared using Dragon voice recognition software and may include unintentional dictation errors.     Darel Hong, MD 10/12/17 [phone]

## 2017-10-12 NOTE — ED Provider Notes (Signed)
Patient received in sign-out from Dr. Mable Paris.  Workup and evaluation pending CT imaging.  CT imaging is unremarkable and reassuring.  Does have some scattered diverticulosis but no evidence of acute infection.  Based on history and physical is most consistent with gastritis or reflux.  Patient will be discharged on antacid medication and antinausea medication.  Have discussed with the patient and available family all diagnostics and treatments performed thus far and all questions were answered to the best of my ability. The patient demonstrates understanding and agreement with plan.       Merlyn Lot, MD 10/12/17 [phone]

## 2017-10-12 NOTE — Discharge Instructions (Signed)

## 2017-10-12 NOTE — ED Triage Notes (Signed)
Patient reports symptoms since just after Thanksgiving with epigastric pain that radiates straight through to her back described as sharpe.

## 2017-10-12 NOTE — ED Notes (Signed)
Dr Mable Paris in to see pt

## 2017-10-12 NOTE — ED Notes (Signed)
In to re-evaluate pt's pain, she has gone to CT

## 2017-12-09 ENCOUNTER — Encounter: Payer: Self-pay | Admitting: *Deleted

## 2017-12-10 ENCOUNTER — Ambulatory Visit: Payer: Managed Care, Other (non HMO) | Admitting: Anesthesiology

## 2017-12-10 ENCOUNTER — Encounter: Payer: Self-pay | Admitting: *Deleted

## 2017-12-10 ENCOUNTER — Ambulatory Visit
Admission: RE | Admit: 2017-12-10 | Discharge: 2017-12-10 | Disposition: A | Discharge status: Home / Self Care | Payer: Managed Care, Other (non HMO) | Source: Ambulatory Visit | Attending: Unknown Physician Specialty | Admitting: Unknown Physician Specialty

## 2017-12-10 ENCOUNTER — Encounter
Admission: RE | Disposition: A | Discharge status: Home / Self Care | Payer: Self-pay | Source: Ambulatory Visit | Attending: Unknown Physician Specialty

## 2017-12-10 DIAGNOSIS — K5730 Diverticulosis of large intestine without perforation or abscess without bleeding: Secondary | ICD-10-CM | POA: Insufficient documentation

## 2017-12-10 DIAGNOSIS — K640 First degree hemorrhoids: Secondary | ICD-10-CM | POA: Diagnosis not present

## 2017-12-10 DIAGNOSIS — Z96651 Presence of right artificial knee joint: Secondary | ICD-10-CM | POA: Diagnosis not present

## 2017-12-10 DIAGNOSIS — K635 Polyp of colon: Secondary | ICD-10-CM | POA: Insufficient documentation

## 2017-12-10 DIAGNOSIS — R1310 Dysphagia, unspecified: Secondary | ICD-10-CM | POA: Insufficient documentation

## 2017-12-10 DIAGNOSIS — K222 Esophageal obstruction: Secondary | ICD-10-CM | POA: Diagnosis not present

## 2017-12-10 DIAGNOSIS — Z86010 Personal history of colonic polyps: Secondary | ICD-10-CM | POA: Insufficient documentation

## 2017-12-10 DIAGNOSIS — E669 Obesity, unspecified: Secondary | ICD-10-CM | POA: Insufficient documentation

## 2017-12-10 DIAGNOSIS — K219 Gastro-esophageal reflux disease without esophagitis: Secondary | ICD-10-CM | POA: Diagnosis not present

## 2017-12-10 DIAGNOSIS — Z79899 Other long term (current) drug therapy: Secondary | ICD-10-CM | POA: Diagnosis not present

## 2017-12-10 DIAGNOSIS — F329 Major depressive disorder, single episode, unspecified: Secondary | ICD-10-CM | POA: Insufficient documentation

## 2017-12-10 DIAGNOSIS — G4730 Sleep apnea, unspecified: Secondary | ICD-10-CM | POA: Diagnosis not present

## 2017-12-10 DIAGNOSIS — Z800 Family history of malignant neoplasm of digestive organs: Secondary | ICD-10-CM | POA: Insufficient documentation

## 2017-12-10 DIAGNOSIS — Z1211 Encounter for screening for malignant neoplasm of colon: Secondary | ICD-10-CM | POA: Insufficient documentation

## 2017-12-10 DIAGNOSIS — Z791 Long term (current) use of non-steroidal anti-inflammatories (NSAID): Secondary | ICD-10-CM | POA: Diagnosis not present

## 2017-12-10 DIAGNOSIS — Z6841 Body Mass Index (BMI) 40.0 and over, adult: Secondary | ICD-10-CM | POA: Diagnosis not present

## 2017-12-10 DIAGNOSIS — I10 Essential (primary) hypertension: Secondary | ICD-10-CM | POA: Diagnosis not present

## 2017-12-10 HISTORY — PX: COLONOSCOPY WITH PROPOFOL: SHX5780

## 2017-12-10 HISTORY — PX: ESOPHAGOGASTRODUODENOSCOPY (EGD) WITH PROPOFOL: SHX5813

## 2017-12-10 SURGERY — ESOPHAGOGASTRODUODENOSCOPY (EGD) WITH PROPOFOL
Anesthesia: General

## 2017-12-10 MED ORDER — LIDOCAINE HCL (PF) 1 % IJ SOLN
INTRAMUSCULAR | Status: AC
  Administered 2017-12-10: 0.3 mL via INTRADERMAL
  Filled 2017-12-10: qty 2

## 2017-12-10 MED ORDER — PROPOFOL 500 MG/50ML IV EMUL
INTRAVENOUS | Status: AC
  Filled 2017-12-10: qty 50

## 2017-12-10 MED ORDER — PIPERACILLIN-TAZOBACTAM 3.375 G IVPB 30 MIN
3.3750 g | Freq: Once | INTRAVENOUS | Status: AC
  Administered 2017-12-10: 3.375 g via INTRAVENOUS
  Filled 2017-12-10: qty 50

## 2017-12-10 MED ORDER — GLYCOPYRROLATE 0.2 MG/ML IJ SOLN
INTRAMUSCULAR | Status: DC | PRN
  Administered 2017-12-10: 0.2 mg via INTRAVENOUS

## 2017-12-10 MED ORDER — LIDOCAINE 2% (20 MG/ML) 5 ML SYRINGE
INTRAMUSCULAR | Status: DC | PRN
  Administered 2017-12-10: 40 mg via INTRAVENOUS

## 2017-12-10 MED ORDER — SODIUM CHLORIDE 0.9 % IV SOLN
INTRAVENOUS | Status: DC
  Administered 2017-12-10: 12:00:00 via INTRAVENOUS
  Administered 2017-12-10: 1000 mL via INTRAVENOUS

## 2017-12-10 MED ORDER — PIPERACILLIN-TAZOBACTAM 3.375 G IVPB
INTRAVENOUS | Status: AC
  Administered 2017-12-10: 3.375 g via INTRAVENOUS
  Filled 2017-12-10: qty 50

## 2017-12-10 MED ORDER — FENTANYL CITRATE (PF) 100 MCG/2ML IJ SOLN
INTRAMUSCULAR | Status: DC | PRN
  Administered 2017-12-10 (×2): 50 ug via INTRAVENOUS

## 2017-12-10 MED ORDER — LIDOCAINE HCL (PF) 1 % IJ SOLN
2.0000 mL | Freq: Once | INTRAMUSCULAR | Status: AC
  Administered 2017-12-10: 0.3 mL via INTRADERMAL

## 2017-12-10 MED ORDER — PROPOFOL 10 MG/ML IV BOLUS
INTRAVENOUS | Status: DC | PRN
  Administered 2017-12-10: 100 mg via INTRAVENOUS

## 2017-12-10 MED ORDER — PROPOFOL 500 MG/50ML IV EMUL
INTRAVENOUS | Status: DC | PRN
  Administered 2017-12-10: 140 ug/kg/min via INTRAVENOUS

## 2017-12-10 MED ORDER — FENTANYL CITRATE (PF) 100 MCG/2ML IJ SOLN
INTRAMUSCULAR | Status: AC
  Filled 2017-12-10: qty 2

## 2017-12-10 NOTE — Op Note (Signed)
[facility] Gastroenterology Patient Name: Jacqueline Delgado Procedure Date: 12/10/2017 12:40 PM MRN: [id number] Account #: [id number] Date of Birth: 1951/10/04 Admit Type: Outpatient Age: 67 Room: [facility] ENDO ROOM 3 Gender: Female Note Status: Finalized Procedure:            Upper GI endoscopy Indications:          Dysphagia Providers:            Manya Silvas, MD Referring MD:         Lynnell Jude (Referring MD) Medicines:            Propofol per Anesthesia Complications:        No immediate complications. Procedure:            Pre-Anesthesia Assessment:                       - After reviewing the risks and benefits, the patient                        was deemed in satisfactory condition to undergo the                        procedure.                       After obtaining informed consent, the endoscope was                        passed under direct vision. Throughout the procedure,                        the patient's blood pressure, pulse, and oxygen                        saturations were monitored continuously. The Endoscope                        was introduced through the mouth, and advanced to the                        second part of duodenum. The upper GI endoscopy was                        accomplished without difficulty. The patient tolerated                        the procedure well. Findings:      A mild Schatzki ring (acquired) was found at the gastroesophageal       junction. After exam of the stomach and duodenum a guide wire was placed       and the scope withdrawn and a 58F Savary dilator was passed with mild       resistance.      The stomach was normal.      The examined duodenum was normal. Impression:           - Mild Schatzki ring.                       - Normal stomach.                       -  Normal examined duodenum.                       - No specimens collected. Recommendation:       - soft food for 3 days, eat slowly, chew  well, take                        small bites Procedure Code(s):    --- Professional ---                       [phone], Esophagogastroduodenoscopy, flexible, transoral;                        diagnostic, including collection of specimen(s) by                        brushing or washing, when performed (separate procedure) CPT copyright 2016 [employer]. All rights reserved. The codes documented in this report are preliminary and upon coder review may  be revised to meet current compliance requirements. Manya Silvas, MD 12/10/2017 12:55:42 PM This report has been signed electronically. Number of Addenda: 0 Note Initiated On: 12/10/2017 12:40 PM      [facility]

## 2017-12-10 NOTE — H&P (Signed)
Primary Care Physician:  Lynnell Jude, MD Primary Gastroenterologist:  Dr. Vira Agar  Pre-Procedure History & Physical: HPI:  Jacqueline Delgado is a 67 y.o. female is here for an endoscopy and colonoscopy.  This is for [facility] colon polyps and FH colon cancer in sister.   Past Medical History:  Diagnosis Date  . Anginal pain ([place])     3/8-12/21/10  . Arthritis    Osteoarthritis in BLE knee  . Colon polyp   . GERD (gastroesophageal reflux disease)   . Hiatal hernia   . History of hiatal hernia   . Hypertension    controlled  . Obesity   . Reflux   . Sleep apnea    NO CPAP  . Small vessel disease     Past Surgical History:  Procedure Laterality Date  . ABDOMINAL HYSTERECTOMY     partial  . CHOLECYSTECTOMY    . COLONOSCOPY W/ POLYPECTOMY     adenomatous colon polyp  . ESOPHAGOGASTRODUODENOSCOPY (EGD) WITH PROPOFOL N/A 10/11/2015   Procedure: ESOPHAGOGASTRODUODENOSCOPY (EGD) WITH PROPOFOL;  Surgeon: Manya Silvas, MD;  Location: [facility] ENDOSCOPY;  Service: Endoscopy;  Laterality: N/A;  . JOINT REPLACEMENT Right    knee, Oct 2012  . JOINT REPLACEMENT     hopefully getting left partial knee replacement in July 2016  . SAVORY DILATION N/A 10/11/2015   Procedure: SAVORY DILATION;  Surgeon: Manya Silvas, MD;  Location: [facility] ENDOSCOPY;  Service: Endoscopy;  Laterality: N/A;  . SHOULDER ARTHROSCOPY WITH ROTATOR CUFF REPAIR AND SUBACROMIAL DECOMPRESSION Right 06/14/2015   Procedure: SHOULDER ARTHROSCOPY WITH mini open ROTATOR CUFF REPAIR AND SUBACROMIAL DECOMPRESSION, release long head biceps tendon.;  Surgeon: Leanor Kail, MD;  Location: [facility] ORS;  Service: Orthopedics;  Laterality: Right;    Prior to Admission medications   Medication Sig Start Date End Date Taking? Authorizing Provider  buPROPion (WELLBUTRIN XL) 150 MG 24 hr tablet Take 150 mg by mouth daily.   Yes [provider]  meloxicam (MOBIC) 15 MG tablet Take 15 mg by mouth daily.   Yes [provider]  phentermine (ADIPEX-P) 37.5 MG tablet Take 37.5 mg by mouth daily before breakfast.   Yes [provider]  albuterol (PROVENTIL HFA;VENTOLIN HFA) 108 (90 Base) MCG/ACT inhaler Inhale 1-2 puffs into the lungs every 4 (four) hours as needed for wheezing or shortness of breath (cough). 02/25/17   Drenda Freeze, MD  diazepam (VALIUM) 5 MG tablet Take 5 mg by mouth every 8 (eight) hours as needed for anxiety. Reported on 05/02/2016    [provider]  Diethylpropion HCl 25 MG TABS Take by mouth. Reported on 05/02/2016    [provider]  esomeprazole (NEXIUM) 40 MG capsule Take 1 capsule (40 mg total) by mouth daily. 02/25/17 02/25/18  Drenda Freeze, MD  FLUoxetine (PROZAC) 20 MG tablet Take 1 tablet (20 mg total) by mouth daily. Patient not taking: Reported on 10/11/2015 07/06/15   Leone Haven, MD  HYDROcodone-acetaminophen (NORCO/VICODIN) 5-325 MG per tablet Take 1-2 tablets by mouth every 4 (four) hours as needed for moderate pain. Patient not taking: Reported on 05/02/2016 06/18/15   Johnn Hai, PA-C  methylPREDNISolone (MEDROL DOSEPAK) 4 MG TBPK tablet Take as directed 06/04/16   Trula Slade, DPM  naproxen (NAPROSYN) 500 MG tablet Take 500 mg by mouth at bedtime as needed. Reported on 05/02/2016    [provider]  ondansetron (ZOFRAN) 4 MG tablet Take 1 tablet (4 mg total) by mouth  daily as needed for nausea or vomiting. 10/08/16   Earleen Newport, MD  potassium chloride SA (K-DUR,KLOR-CON) 20 MEQ tablet Take 20 mEq by mouth daily. Reported on 05/02/2016    [provider]  promethazine (PHENERGAN) 12.5 MG tablet Take 1 tablet (12.5 mg total) by mouth every 6 (six) hours as needed for nausea or vomiting. 10/12/17   Merlyn Lot, MD  RABEprazole (ACIPHEX) 20 MG tablet Take 20 mg by mouth daily. Reported on 05/02/2016    [provider]  ranitidine (ZANTAC) 150 MG tablet Take 1 tablet (150 mg total) by mouth 2 (two)  times daily. 10/12/17 10/12/18  Merlyn Lot, MD  ROXICET 5-325 MG per tablet Take 1-2 tablets by mouth every 6 (six) hours as needed for severe pain. Patient not taking: Reported on 07/03/2015 06/14/15   Leanor Kail, MD  sucralfate (CARAFATE) 1 G tablet Take 1 g by mouth 4 (four) times daily -  with meals and at bedtime. Reported on 05/02/2016    [provider]  torsemide (DEMADEX) 20 MG tablet Take 1 tablet by mouth two  times daily 07/19/15   Ashok Norris, MD  traMADol (ULTRAM) 50 MG tablet Take 1 tablet (50 mg total) by mouth every 12 (twelve) hours as needed. 06/04/16   Trula Slade, DPM    Allergies as of 12/05/2017 - Review Complete 10/12/2017  Allergen Reaction Noted  . Ibuprofen  10/10/2015    Family History  Problem Relation Age of Onset  . Alcoholism Unknown        brother  . Arthritis Unknown        parent  . Breast cancer Unknown        mother and grandmother  . Hyperlipidemia Father   . Heart disease Father   . Stroke Father   . Hypertension Father   . Diabetes Unknown        parent and other family member  . Breast cancer Mother 62  . Breast cancer Maternal Grandmother        young    Social History   Socioeconomic History  . Marital status: Single    Spouse name: Not on file  . Number of children: Not on file  . Years of education: Not on file  . Highest education level: Not on file  Social Needs  . Financial resource strain: Not on file  . Food insecurity - worry: Not on file  . Food insecurity - inability: Not on file  . Transportation needs - medical: Not on file  . Transportation needs - non-medical: Not on file  Occupational History  . Not on file  Tobacco Use  . Smoking status: Never Smoker  . Smokeless tobacco: Never Used  Substance and Sexual Activity  . Alcohol use: No  . Drug use: No  . Sexual activity: Not on file  Other Topics Concern  . Not on file  Social History Narrative  . Not on file    Review of  Systems: See HPI, otherwise negative ROS  Physical Exam: BP (!) 142/108   Pulse 72   Temp 97.6 F (36.4 C) (Tympanic) Comment: Simultaneous filing. User may not have seen previous data.  Resp 20   Ht 5\' 4"  (1.626 m)   Wt 121.6 kg (268 lb)   SpO2 99%   BMI 46.00 kg/m  General:   Alert,  pleasant and cooperative in NAD Head:  Normocephalic and atraumatic. Neck:  Supple; no masses or thyromegaly. Lungs:  Clear throughout  to auscultation.    Heart:  Regular rate and rhythm. Abdomen:  Soft, nontender and nondistended. Normal bowel sounds, without guarding, and without rebound.   Neurologic:  Alert and  oriented x4;  grossly normal neurologically.  Impression/Plan: Jacqueline Delgado is here for an endoscopy and colonoscopy to be performed for [facility] colon polyps and FH colon cancer in sister  Risks, benefits, limitations, and alternatives regarding  endoscopy and colonoscopy have been reviewed with the patient.  Questions have been answered.  All parties agreeable.   Gaylyn Cheers, MD  12/10/2017, 3:41 PM

## 2017-12-10 NOTE — Op Note (Signed)
[facility]astroenter[facility] Patient Name: Jacqueline Delgado Procedure Date: 12/10/2017 12:40 PM MRN: [id number] Account #: [id number] Date of Birth: Feb 04, 1951 Admit Type: Outpatient Age: 67 Room: [facility] ENDO ROOM 3 Gender: Female Note Status: Finalized Procedure:            Colonoscopy Indications:          High risk colon cancer surveillance: Personal history                        of colonic polyps, Family history of colon cancer in a                        first-degree relative Providers:            Manya Silvas, MD Referring MD:         Lynnell Jude (Referring MD) Medicines:            Propofol per Anesthesia Complications:        No immediate complications. Procedure:            Pre-Anesthesia Assessment:                       - After reviewing the risks and benefits, the patient                        was deemed in satisfactory condition to undergo the                        procedure.                       After obtaining informed consent, the colonoscope was                        passed under direct vision. Throughout the procedure,                        the patient's blood pressure, pulse, and oxygen                        saturations were monitored continuously. The                        Colonoscope was introduced through the anus and                        advanced to the the cecum, identified by appendiceal                        orifice and ileocecal valve. The colonoscopy was                        performed without difficulty. The patient tolerated the                        procedure well. The quality of the bowel preparation                        was excellent. Findings:      A diminutive polyp was found in the hepatic flexure. The polyp was  sessile. The polyp was removed with a jumbo cold forceps. Resection and       retrieval were complete.      Many small-mouthed diverticula were found in the sigmoid colon.      Internal  hemorrhoids were found during endoscopy. The hemorrhoids were       small and Grade I (internal hemorrhoids that do not prolapse).      The exam was otherwise without abnormality. Impression:           - One diminutive polyp at the hepatic flexure, removed                        with a jumbo cold forceps. Resected and retrieved.                       - Diverticulosis in the sigmoid colon.                       - Internal hemorrhoids.                       - The examination was otherwise normal. Recommendation:       - Await pathology results. Manya Silvas, MD 12/10/2017 1:14:48 PM This report has been signed electronically. Number of Addenda: 0 Note Initiated On: 12/10/2017 12:40 PM Scope Withdrawal Time: 0 hours 9 minutes 18 seconds  Total Procedure Duration: 0 hours 14 minutes 19 seconds       [facility]

## 2017-12-10 NOTE — Anesthesia Postprocedure Evaluation (Signed)
Anesthesia Post Note  Patient: [name]  Procedure(s) Performed: ESOPHAGOGASTRODUODENOSCOPY (EGD) WITH PROPOFOL (N/A ) COLONOSCOPY WITH PROPOFOL (N/A )  Patient location during evaluation: Endoscopy Anesthesia Type: General Level of consciousness: awake and alert and oriented Pain management: pain level controlled Vital Signs Assessment: post-procedure vital signs reviewed and stable Respiratory status: spontaneous breathing, nonlabored ventilation and respiratory function stable Cardiovascular status: blood pressure returned to baseline and stable Postop Assessment: no signs of nausea or vomiting Anesthetic complications: no     Last Vitals:  Vitals:   12/10/17 1325 12/10/17 1335  BP: (!) 126/111 (!) 142/108  Pulse: (!) 104 72  Resp: (!) 22 20  Temp:    SpO2: 98% 99%    Last Pain:  Vitals:   12/10/17 1315  TempSrc: Tympanic  PainSc: 0-No pain                 Zolton Dowson

## 2017-12-10 NOTE — Anesthesia Post-op Follow-up Note (Signed)
Anesthesia QCDR form completed.        

## 2017-12-10 NOTE — Transfer of Care (Signed)
Immediate Anesthesia Transfer of Care Note  Patient: Jacqueline Delgado  Procedure(s) Performed: ESOPHAGOGASTRODUODENOSCOPY (EGD) WITH PROPOFOL (N/A ) COLONOSCOPY WITH PROPOFOL (N/A )  Patient Location: PACU and Endoscopy Unit  Anesthesia Type:General  Level of Consciousness: awake and patient cooperative  Airway & Oxygen Therapy: Patient Spontanous Breathing and Patient connected to nasal cannula oxygen  Post-op Assessment: Report given to RN and Post -op Vital signs reviewed and stable  Post vital signs: Reviewed and stable  Last Vitals:  Vitals:   12/10/17 1154 12/10/17 1156  BP:  (!) 154/92  Temp: (!) 35.9 C     Last Pain:  Vitals:   12/10/17 1154  TempSrc: Tympanic  PainSc: 8          Complications: No apparent anesthesia complications

## 2017-12-10 NOTE — Anesthesia Preprocedure Evaluation (Signed)
Anesthesia Evaluation  Patient identified by MRN, date of birth, ID band Patient awake    Reviewed: Allergy & Precautions, NPO status , Patient's Chart, lab work & pertinent test results  History of Anesthesia Complications Negative for: history of anesthetic complications  Airway Mallampati: III  TM Distance: >3 FB Neck ROM: Full    Dental no notable dental hx.    Pulmonary sleep apnea (had CPAP in past but stopped wearing) , neg COPD,    breath sounds clear to auscultation- rhonchi (-) wheezing      Cardiovascular hypertension, Pt. on medications (-) CAD, (-) Past MI, (-) Cardiac Stents and (-) CABG  Rhythm:Regular Rate:Normal - Systolic murmurs and - Diastolic murmurs    Neuro/Psych PSYCHIATRIC DISORDERS Depression negative neurological ROS     GI/Hepatic Neg liver ROS, hiatal hernia, GERD  ,  Endo/Other  negative endocrine ROSneg diabetes  Renal/GU negative Renal ROS     Musculoskeletal  (+) Arthritis ,   Abdominal (+) + obese,   Peds  Hematology negative hematology ROS (+)   Anesthesia Other Findings Past Medical History: No date: Anginal pain ([facility])     Comment:   3/8-12/21/10 No date: Arthritis     Comment:  Osteoarthritis in BLE knee No date: Colon polyp No date: GERD (gastroesophageal reflux disease) No date: Hiatal hernia No date: History of hiatal hernia No date: Hypertension     Comment:  controlled No date: Obesity No date: Reflux No date: Sleep apnea     Comment:  NO CPAP No date: Small vessel disease   Reproductive/Obstetrics                             Anesthesia Physical Anesthesia Plan  ASA: II  Anesthesia Plan: General   Post-op Pain Management:    Induction: Intravenous  PONV Risk Score and Plan: 2 and Propofol infusion  Airway Management Planned: Natural Airway  Additional Equipment:   Intra-op Plan:   Post-operative Plan:   Informed Consent:  I have reviewed the patients History and Physical, chart, labs and discussed the procedure including the risks, benefits and alternatives for the proposed anesthesia with the patient or authorized representative who has indicated his/her understanding and acceptance.   Dental advisory given  Plan Discussed with: CRNA and Anesthesiologist  Anesthesia Plan Comments:         Anesthesia Quick Evaluation

## 2017-12-11 ENCOUNTER — Encounter: Payer: Self-pay | Admitting: Unknown Physician Specialty

## 2017-12-15 LAB — SURGICAL PATHOLOGY

## 2018-04-13 DIAGNOSIS — Z6841 Body Mass Index (BMI) 40.0 and over, adult: Secondary | ICD-10-CM | POA: Diagnosis not present

## 2018-04-13 DIAGNOSIS — R7309 Other abnormal glucose: Secondary | ICD-10-CM | POA: Diagnosis not present

## 2018-04-13 DIAGNOSIS — G4730 Sleep apnea, unspecified: Secondary | ICD-10-CM | POA: Diagnosis not present

## 2018-04-13 DIAGNOSIS — D519 Vitamin B12 deficiency anemia, unspecified: Secondary | ICD-10-CM | POA: Diagnosis not present

## 2018-04-13 DIAGNOSIS — R69 Illness, unspecified: Secondary | ICD-10-CM | POA: Diagnosis not present

## 2018-04-13 DIAGNOSIS — I10 Essential (primary) hypertension: Secondary | ICD-10-CM | POA: Diagnosis not present

## 2018-07-23 ENCOUNTER — Encounter: Payer: Self-pay | Admitting: Emergency Medicine

## 2018-07-23 DIAGNOSIS — R21 Rash and other nonspecific skin eruption: Secondary | ICD-10-CM | POA: Diagnosis present

## 2018-07-23 DIAGNOSIS — L52 Erythema nodosum: Secondary | ICD-10-CM | POA: Diagnosis not present

## 2018-07-23 LAB — CBC WITH DIFFERENTIAL/PLATELET
ABS IMMATURE GRANULOCYTES: 0.01 10*3/uL (ref 0.00–0.07)
BASOS PCT: 1 %
Basophils Absolute: 0 10*3/uL (ref 0.0–0.1)
Eosinophils Absolute: 0.2 10*3/uL (ref 0.0–0.5)
Eosinophils Relative: 3 %
HEMATOCRIT: 34 % — AB (ref 36.0–46.0)
HEMOGLOBIN: 10.8 g/dL — AB (ref 12.0–15.0)
IMMATURE GRANULOCYTES: 0 %
LYMPHS ABS: 1.8 10*3/uL (ref 0.7–4.0)
LYMPHS PCT: 30 %
MCH: 26 pg (ref 26.0–34.0)
MCHC: 31.8 g/dL (ref 30.0–36.0)
MCV: 81.9 fL (ref 80.0–100.0)
MONO ABS: 0.6 10*3/uL (ref 0.1–1.0)
MONOS PCT: 10 %
NEUTROS ABS: 3.4 10*3/uL (ref 1.7–7.7)
NEUTROS PCT: 56 %
PLATELETS: 226 10*3/uL (ref 150–400)
RBC: 4.15 MIL/uL (ref 3.87–5.11)
RDW: 14.6 % (ref 11.5–15.5)
WBC: 6 10*3/uL (ref 4.0–10.5)
nRBC: 0 % (ref 0.0–0.2)

## 2018-07-23 MED ORDER — PREDNISONE 20 MG PO TABS
60.0000 mg | ORAL_TABLET | Freq: Once | ORAL | Status: AC
Start: 2018-07-24 — End: 2018-07-23
  Administered 2018-07-23: 60 mg via ORAL
  Filled 2018-07-23: qty 3

## 2018-07-23 MED ORDER — IBUPROFEN 600 MG PO TABS
600.0000 mg | ORAL_TABLET | Freq: Once | ORAL | Status: AC
  Administered 2018-07-23: 600 mg via ORAL
  Filled 2018-07-23: qty 1

## 2018-07-23 NOTE — ED Provider Notes (Signed)
Nurse Lorriane Shire asked me to evaluate the patient in triage.  The rash on her bilateral shins appears to be erythema nodosum.  We will give a trial of nonsteroidals and steroids as well as check basic labs.   Darel Hong, MD 07/23/18 [phone]

## 2018-07-23 NOTE — ED Triage Notes (Signed)
Pt c/o bilateral lower leg rash with pain, tenderness and hot to the touch. Red areas seen on lower extremities. Pt has hx/o vasculitis.

## 2018-07-24 ENCOUNTER — Emergency Department
Admission: EM | Admit: 2018-07-24 | Discharge: 2018-07-24 | Disposition: A | Discharge status: Home / Self Care | Payer: Medicare HMO | Attending: Emergency Medicine | Admitting: Emergency Medicine

## 2018-07-24 DIAGNOSIS — L52 Erythema nodosum: Secondary | ICD-10-CM

## 2018-07-24 LAB — COMPREHENSIVE METABOLIC PANEL
ALT: 12 U/L (ref 0–44)
AST: 17 U/L (ref 15–41)
Albumin: 3.7 g/dL (ref 3.5–5.0)
Alkaline Phosphatase: 100 U/L (ref 38–126)
Anion gap: 8 (ref 5–15)
BILIRUBIN TOTAL: 0.5 mg/dL (ref 0.3–1.2)
BUN: 19 mg/dL (ref 8–23)
CO2: 29 mmol/L (ref 22–32)
CREATININE: 1.29 mg/dL — AB (ref 0.44–1.00)
Calcium: 9.1 mg/dL (ref 8.9–10.3)
Chloride: 104 mmol/L (ref 98–111)
GFR, EST AFRICAN AMERICAN: 49 mL/min — AB (ref >60)
GFR, EST NON AFRICAN AMERICAN: 42 mL/min — AB (ref >60)
Glucose, Bld: 98 mg/dL (ref 70–99)
POTASSIUM: 3.5 mmol/L (ref 3.5–5.1)
Sodium: 141 mmol/L (ref 135–145)
TOTAL PROTEIN: 7.5 g/dL (ref 6.5–8.1)

## 2018-07-24 MED ORDER — PREDNISONE 50 MG PO TABS: 50.0000 mg | ORAL_TABLET | Freq: Every day | ORAL | 0 refills | Status: AC

## 2018-07-24 MED ORDER — HYDROCODONE-ACETAMINOPHEN 5-325 MG PO TABS: 1.0000 | ORAL_TABLET | Freq: Four times a day (QID) | ORAL | 0 refills | Status: DC | PRN

## 2018-07-24 NOTE — ED Notes (Signed)
Pt left without discharge papers. 

## 2018-07-24 NOTE — Discharge Instructions (Signed)
It was a pleasure to take care of you today, and thank you for coming to our emergency department.  If you have any questions or concerns before leaving please ask the nurse to grab me and I'm more than happy to go through your aftercare instructions again.  If you were prescribed any opioid pain medication today such as Norco, Vicodin, Percocet, morphine, hydrocodone, or oxycodone please make sure you do not drive when you are taking this medication as it can alter your ability to drive safely.  If you have any concerns once you are home that you are not improving or are in fact getting worse before you can make it to your follow-up appointment, please do not hesitate to call 911 and come back for further evaluation.  Darel Hong, MD  Results for orders placed or performed during the hospital encounter of 07/24/18  Comprehensive metabolic panel  Result Value Ref Range   Sodium 141 135 - 145 mmol/L   Potassium 3.5 3.5 - 5.1 mmol/L   Chloride 104 98 - 111 mmol/L   CO2 29 22 - 32 mmol/L   Glucose, Bld 98 70 - 99 mg/dL   BUN 19 8 - 23 mg/dL   Creatinine, Ser 1.29 (H) 0.44 - 1.00 mg/dL   Calcium 9.1 8.9 - 10.3 mg/dL   Total Protein 7.5 6.5 - 8.1 g/dL   Albumin 3.7 3.5 - 5.0 g/dL   AST 17 15 - 41 U/L   ALT 12 0 - 44 U/L   Alkaline Phosphatase 100 38 - 126 U/L   Total Bilirubin 0.5 0.3 - 1.2 mg/dL   GFR calc non Af Amer 42 (L) >60 mL/min   GFR calc Af Amer 49 (L) >60 mL/min   Anion gap 8 5 - 15  CBC with Differential  Result Value Ref Range   WBC 6.0 4.0 - 10.5 K/uL   RBC 4.15 3.87 - 5.11 MIL/uL   Hemoglobin 10.8 (L) 12.0 - 15.0 g/dL   HCT 34.0 (L) 36.0 - 46.0 %   MCV 81.9 80.0 - 100.0 fL   MCH 26.0 26.0 - 34.0 pg   MCHC 31.8 30.0 - 36.0 g/dL   RDW 14.6 11.5 - 15.5 %   Platelets 226 150 - 400 K/uL   nRBC 0.0 0.0 - 0.2 %   Neutrophils Relative % 56 %   Neutro Abs 3.4 1.7 - 7.7 K/uL   Lymphocytes Relative 30 %   Lymphs Abs 1.8 0.7 - 4.0 K/uL   Monocytes Relative 10 %   Monocytes Absolute 0.6 0.1 - 1.0 K/uL   Eosinophils Relative 3 %   Eosinophils Absolute 0.2 0.0 - 0.5 K/uL   Basophils Relative 1 %   Basophils Absolute 0.0 0.0 - 0.1 K/uL   Immature Granulocytes 0 %   Abs Immature Granulocytes 0.01 0.00 - 0.07 K/uL

## 2018-07-24 NOTE — ED Provider Notes (Signed)
[facility] Emergency Department Provider Note  ____________________________________________   First MD Initiated Contact with Patient 07/24/18 [phone]     (approximate)  I have reviewed the triage vital signs and the nursing notes.   HISTORY  Chief Complaint Rash   HPI Jacqueline Delgado is a 67 y.o. female who self presents to the emergency department with painful rash to anterior right shin for 2 days and 1 day of the left shin.  She has a long-standing history of "vasculitis" although has not had an episode in several years.  She has no recent illness.  No fevers or chills.  No chest pain or shortness of breath.  No nausea vomiting or diarrhea.  No new exposures.  She has never had a rash like this on her legs before.  No change in her medications recently.  Her rash is painful and not particularly itchy.  No trauma.  The pain is throbbing aching constant worse when touching improved when not.    Past Medical History:  Diagnosis Date  . Anginal pain ([place])     3/8-12/21/10  . Arthritis    Osteoarthritis in BLE knee  . Colon polyp   . GERD (gastroesophageal reflux disease)   . Hiatal hernia   . History of hiatal hernia   . Hypertension    controlled  . Obesity   . Reflux   . Sleep apnea    NO CPAP  . Small vessel disease [facility])     Patient Active Problem List   Diagnosis Date Noted  . Plantar fasciitis 05/03/2016  . Depression, major, recurrent, moderate ([place]) 07/06/2015    Past Surgical History:  Procedure Laterality Date  . ABDOMINAL HYSTERECTOMY     partial  . CHOLECYSTECTOMY    . COLONOSCOPY W/ POLYPECTOMY     adenomatous colon polyp  . COLONOSCOPY WITH PROPOFOL N/A 12/10/2017   Procedure: COLONOSCOPY WITH PROPOFOL;  Surgeon: Manya Silvas, MD;  Location: [facility] ENDOSCOPY;  Service: Endoscopy;  Laterality: N/A;  . ESOPHAGOGASTRODUODENOSCOPY (EGD) WITH PROPOFOL N/A 10/11/2015   Procedure: ESOPHAGOGASTRODUODENOSCOPY (EGD) WITH PROPOFOL;   Surgeon: Manya Silvas, MD;  Location: [facility] ENDOSCOPY;  Service: Endoscopy;  Laterality: N/A;  . ESOPHAGOGASTRODUODENOSCOPY (EGD) WITH PROPOFOL N/A 12/10/2017   Procedure: ESOPHAGOGASTRODUODENOSCOPY (EGD) WITH PROPOFOL;  Surgeon: Manya Silvas, MD;  Location: [facility] ENDOSCOPY;  Service: Endoscopy;  Laterality: N/A;  . JOINT REPLACEMENT Right    knee, Oct 2012  . JOINT REPLACEMENT     hopefully getting left partial knee replacement in July 2016  . SAVORY DILATION N/A 10/11/2015   Procedure: SAVORY DILATION;  Surgeon: Manya Silvas, MD;  Location: [facility] ENDOSCOPY;  Service: Endoscopy;  Laterality: N/A;  . SHOULDER ARTHROSCOPY WITH ROTATOR CUFF REPAIR AND SUBACROMIAL DECOMPRESSION Right 06/14/2015   Procedure: SHOULDER ARTHROSCOPY WITH mini open ROTATOR CUFF REPAIR AND SUBACROMIAL DECOMPRESSION, release long head biceps tendon.;  Surgeon: Leanor Kail, MD;  Location: [facility] ORS;  Service: Orthopedics;  Laterality: Right;    Prior to Admission medications   Medication Sig Start Date End Date Taking? Authorizing Provider  albuterol (PROVENTIL HFA;VENTOLIN HFA) 108 (90 Base) MCG/ACT inhaler Inhale 1-2 puffs into the lungs every 4 (four) hours as needed for wheezing or shortness of breath (cough). 02/25/17   Drenda Freeze, MD  buPROPion (WELLBUTRIN XL) 150 MG 24 hr tablet Take 150 mg by mouth daily.    [provider]  diazepam (VALIUM) 5 MG tablet Take 5 mg by mouth every 8 (eight) hours  as needed for anxiety. Reported on 05/02/2016    [provider]  Diethylpropion HCl 25 MG TABS Take by mouth. Reported on 05/02/2016    [provider]  esomeprazole (NEXIUM) 40 MG capsule Take 1 capsule (40 mg total) by mouth daily. 02/25/17 02/25/18  Drenda Freeze, MD  FLUoxetine (PROZAC) 20 MG tablet Take 1 tablet (20 mg total) by mouth daily. Patient not taking: Reported on 10/11/2015 07/06/15   Leone Haven, MD  HYDROcodone-acetaminophen [facility]) 5-325 MG tablet Take 1  tablet by mouth every 6 (six) hours as needed for up to 7 doses for severe pain. 07/24/18   Darel Hong, MD  meloxicam (MOBIC) 15 MG tablet Take 15 mg by mouth daily.    [provider]  methylPREDNISolone (MEDROL DOSEPAK) 4 MG TBPK tablet Take as directed 06/04/16   Trula Slade, DPM  naproxen (NAPROSYN) 500 MG tablet Take 500 mg by mouth at bedtime as needed. Reported on 05/02/2016    [provider]  ondansetron (ZOFRAN) 4 MG tablet Take 1 tablet (4 mg total) by mouth daily as needed for nausea or vomiting. 10/08/16   Earleen Newport, MD  phentermine (ADIPEX-P) 37.5 MG tablet Take 37.5 mg by mouth daily before breakfast.    [provider]  potassium chloride SA (K-DUR,KLOR-CON) 20 MEQ tablet Take 20 mEq by mouth daily. Reported on 05/02/2016    [provider]  predniSONE (DELTASONE) 50 MG tablet Take 1 tablet (50 mg total) by mouth daily for 4 days. 07/24/18 07/28/18  Darel Hong, MD  promethazine (PHENERGAN) 12.5 MG tablet Take 1 tablet (12.5 mg total) by mouth every 6 (six) hours as needed for nausea or vomiting. 10/12/17   Merlyn Lot, MD  RABEprazole (ACIPHEX) 20 MG tablet Take 20 mg by mouth daily. Reported on 05/02/2016    [provider]  ranitidine (ZANTAC) 150 MG tablet Take 1 tablet (150 mg total) by mouth 2 (two) times daily. 10/12/17 10/12/18  Merlyn Lot, MD  ROXICET 5-325 MG per tablet Take 1-2 tablets by mouth every 6 (six) hours as needed for severe pain. Patient not taking: Reported on 07/03/2015 06/14/15   Leanor Kail, MD  sucralfate (CARAFATE) 1 G tablet Take 1 g by mouth 4 (four) times daily -  with meals and at bedtime. Reported on 05/02/2016    [provider]  torsemide (DEMADEX) 20 MG tablet Take 1 tablet by mouth two  times daily 07/19/15   Ashok Norris, MD  traMADol (ULTRAM) 50 MG tablet Take 1 tablet (50 mg total) by mouth every 12 (twelve) hours as needed. 06/04/16   Trula Slade, DPM    Allergies Ibuprofen  Family History  Problem Relation Age of Onset  . Alcoholism Unknown        brother  . Arthritis Unknown        parent  . Breast cancer Unknown        mother and grandmother  . Hyperlipidemia Father   . Heart disease Father   . Stroke Father   . Hypertension Father   . Diabetes Unknown        parent and other family member  . Breast cancer Mother 32  . Breast cancer Maternal Grandmother        young    Social History Social History   Tobacco Use  . Smoking status: Never Smoker  . Smokeless tobacco: Never Used  Substance Use Topics  . Alcohol use: No  . Drug  use: No    Review of Systems Constitutional: No fever/chills Eyes: No visual changes. ENT: No sore throat. Cardiovascular: Denies chest pain. Respiratory: Denies shortness of breath. Gastrointestinal: No abdominal pain.  No nausea, no vomiting.  No diarrhea.  No constipation. Genitourinary: Negative for dysuria. Musculoskeletal: Positive for leg pain Skin: Positive for rash. Neurological: Negative for headaches, focal weakness or numbness.   ____________________________________________   PHYSICAL EXAM:  VITAL SIGNS: ED Triage Vitals  Enc Vitals Group     BP 07/23/18 2339 140/80     Pulse Rate 07/23/18 2339 89     Resp 07/23/18 2339 17     Temp 07/23/18 2339 98 F (36.7 C)     Temp Source 07/23/18 2339 Oral     SpO2 07/23/18 2339 97 %     Weight --      Height --      Head Circumference --      Peak Flow --      Pain Score 07/23/18 2337 6     Pain Loc --      Pain Edu? --      Excl. in [place]? --     Constitutional: Alert and oriented x4 pleasant cooperative speaks in full clear sentences no diaphoresis Eyes: PERRL EOMI. Head: Atraumatic. Nose: No congestion/rhinnorhea. Mouth/Throat: No trismus Neck: No stridor.   Cardiovascular: Normal rate, regular rhythm. Grossly normal heart sounds.  Good peripheral circulation. Respiratory: Normal respiratory effort.  No  retractions. Lungs CTAB and moving good air Gastrointestinal: Obese soft nontender Musculoskeletal: No lower extremity edema   Neurologic:  Normal speech and language. No gross focal neurologic deficits are appreciated. Skin: Please see photos of the rash below but she has palpable erythematous nodules on anterior shins bilaterally Psychiatric: Mood and affect are normal. Speech and behavior are normal.        ____________________________________________   DIFFERENTIAL includes but not limited to  Erythema nodosum, vasculitis, cellulitis, insect bite ____________________________________________   LABS (all labs ordered are listed, but only abnormal results are displayed)  Labs Reviewed  COMPREHENSIVE METABOLIC PANEL - Abnormal; Notable for the following components:      Result Value   Creatinine, Ser 1.29 (*)    GFR calc non Af Amer 42 (*)    GFR calc Af Amer 49 (*)    All other components within normal limits  CBC WITH DIFFERENTIAL/PLATELET - Abnormal; Notable for the following components:   Hemoglobin 10.8 (*)    HCT 34.0 (*)    All other components within normal limits    Lab work reviewed by me with CKD but no acute disease __________________________________________  EKG   ____________________________________________  RADIOLOGY   ____________________________________________   PROCEDURES  Procedure(s) performed: no  Procedures  Critical Care performed: no  ____________________________________________   INITIAL IMPRESSION / ASSESSMENT AND PLAN / ED COURSE  Pertinent labs & imaging results that were available during my care of the patient were reviewed by me and considered in my medical decision making (see chart for details).   As part of my medical decision making, I reviewed the following data within the [place] History obtained from family if available, nursing notes, old chart and ekg, as well as notes from prior ED  visits.  The patient's rash is consistent with erythema nodosum.  Given ibuprofen and steroids with improvement in her symptoms.  Unclear etiology as she has no new exposures however I will give her a short course of steroids and refer her  back to dermatology.  She verbalizes understanding agreement the plan.      ____________________________________________   FINAL CLINICAL IMPRESSION(S) / ED DIAGNOSES  Final diagnoses:  Erythema nodosum      NEW MEDICATIONS STARTED DURING THIS VISIT:  Discharge Medication List as of 07/24/2018  6:47 PM    START taking these medications   Details  predniSONE (DELTASONE) 50 MG tablet Take 1 tablet (50 mg total) by mouth daily for 4 days., Starting Fri 07/24/2018, Until Tue 07/28/2018, Print         Note:  This document was prepared using Dragon voice recognition software and may include unintentional dictation errors.     Darel Hong, MD 07/26/18 2150

## 2018-07-24 NOTE — ED Notes (Addendum)
Per Dorothyann Gibbs, RN: Pt stated she had to leave. It was explained to pt that EDP was printing d/c paperwork at that time and it could take a few minutes, but pt still insisted on leaving. Unable to obtain d/c vital signs, review d/c paperwork, or obtain e-signature.

## 2018-07-29 DIAGNOSIS — R21 Rash and other nonspecific skin eruption: Secondary | ICD-10-CM | POA: Diagnosis not present

## 2018-07-29 DIAGNOSIS — L608 Other nail disorders: Secondary | ICD-10-CM | POA: Diagnosis not present

## 2018-07-29 DIAGNOSIS — L52 Erythema nodosum: Secondary | ICD-10-CM | POA: Diagnosis not present

## 2018-07-29 DIAGNOSIS — L03011 Cellulitis of right finger: Secondary | ICD-10-CM | POA: Diagnosis not present

## 2018-09-07 DIAGNOSIS — M1712 Unilateral primary osteoarthritis, left knee: Secondary | ICD-10-CM | POA: Diagnosis not present

## 2018-09-28 ENCOUNTER — Encounter: Payer: Self-pay | Admitting: Emergency Medicine

## 2018-09-28 ENCOUNTER — Other Ambulatory Visit: Payer: Self-pay

## 2018-09-28 ENCOUNTER — Emergency Department
Admission: EM | Admit: 2018-09-28 | Discharge: 2018-09-28 | Disposition: A | Discharge status: Home / Self Care | Payer: Medicare HMO | Attending: Emergency Medicine | Admitting: Emergency Medicine

## 2018-09-28 ENCOUNTER — Emergency Department: Payer: Medicare HMO

## 2018-09-28 DIAGNOSIS — R42 Dizziness and giddiness: Secondary | ICD-10-CM | POA: Diagnosis not present

## 2018-09-28 DIAGNOSIS — R51 Headache: Secondary | ICD-10-CM | POA: Insufficient documentation

## 2018-09-28 DIAGNOSIS — R519 Headache, unspecified: Secondary | ICD-10-CM

## 2018-09-28 DIAGNOSIS — I10 Essential (primary) hypertension: Secondary | ICD-10-CM | POA: Insufficient documentation

## 2018-09-28 DIAGNOSIS — Z79899 Other long term (current) drug therapy: Secondary | ICD-10-CM | POA: Diagnosis not present

## 2018-09-28 LAB — COMPREHENSIVE METABOLIC PANEL
ALT: 11 U/L (ref 0–44)
AST: 16 U/L (ref 15–41)
Albumin: 3.9 g/dL (ref 3.5–5.0)
Alkaline Phosphatase: 98 U/L (ref 38–126)
Anion gap: 7 (ref 5–15)
BUN: 15 mg/dL (ref 8–23)
CO2: 28 mmol/L (ref 22–32)
Calcium: 9.2 mg/dL (ref 8.9–10.3)
Chloride: 107 mmol/L (ref 98–111)
Creatinine, Ser: 1.13 mg/dL — ABNORMAL HIGH (ref 0.44–1.00)
GFR calc non Af Amer: 51 mL/min — ABNORMAL LOW (ref >60)
GFR, EST AFRICAN AMERICAN: 59 mL/min — AB (ref >60)
Glucose, Bld: 92 mg/dL (ref 70–99)
Potassium: 3.5 mmol/L (ref 3.5–5.1)
Sodium: 142 mmol/L (ref 135–145)
Total Bilirubin: 0.3 mg/dL (ref 0.3–1.2)
Total Protein: 7.6 g/dL (ref 6.5–8.1)

## 2018-09-28 LAB — CBC
HCT: 34.9 % — ABNORMAL LOW (ref 36.0–46.0)
Hemoglobin: 10.7 g/dL — ABNORMAL LOW (ref 12.0–15.0)
MCH: 25.3 pg — AB (ref 26.0–34.0)
MCHC: 30.7 g/dL (ref 30.0–36.0)
MCV: 82.5 fL (ref 80.0–100.0)
Platelets: 232 10*3/uL (ref 150–400)
RBC: 4.23 MIL/uL (ref 3.87–5.11)
RDW: 15.4 % (ref 11.5–15.5)
WBC: 5.3 10*3/uL (ref 4.0–10.5)
nRBC: 0 % (ref 0.0–0.2)

## 2018-09-28 LAB — TROPONIN I: Troponin I: <0.03 ng/mL (ref <0.03)

## 2018-09-28 MED ORDER — ACETAMINOPHEN 500 MG PO TABS
1000.0000 mg | ORAL_TABLET | Freq: Once | ORAL | Status: AC
  Administered 2018-09-28: 1000 mg via ORAL
  Filled 2018-09-28: qty 2

## 2018-09-28 MED ORDER — SODIUM CHLORIDE 0.9 % IV BOLUS
1000.0000 mL | Freq: Once | INTRAVENOUS | Status: AC
  Administered 2018-09-28: 1000 mL via INTRAVENOUS

## 2018-09-28 MED ORDER — DIPHENHYDRAMINE HCL 50 MG/ML IJ SOLN
50.0000 mg | Freq: Once | INTRAMUSCULAR | Status: AC
  Administered 2018-09-28: 50 mg via INTRAVENOUS
  Filled 2018-09-28: qty 1

## 2018-09-28 MED ORDER — METOCLOPRAMIDE HCL 5 MG/ML IJ SOLN
10.0000 mg | Freq: Once | INTRAMUSCULAR | Status: AC
  Administered 2018-09-28: 10 mg via INTRAVENOUS
  Filled 2018-09-28: qty 2

## 2018-09-28 NOTE — ED Provider Notes (Signed)
[facility] Emergency Department Provider Note  Time seen: 8:30 PM  I have reviewed the triage vital signs and the nursing notes.   HISTORY  Chief Complaint Headache and Dizziness    HPI Jacqueline Delgado is a 67 y.o. female with a past medical history of gastric reflux, hypertension, obesity, presents to the emergency department for headaches dizziness intermittent memory loss.  According to the patient for the past 3 weeks she has been experiencing headaches on a near daily basis, states she rarely gets headaches prior to 3 weeks ago.  Is also been experiencing symptoms of dizziness from time to time and feeling like she is "losing time" which she describes as forgetting things.  Patient has seen neurology in the past for memory loss issues and "blackouts".  Patient denies any migraine history but states she has been experiencing pain in the back of her neck and head which she believes is causing her headaches.  Denies any focal weakness or numbness denies any fever.  Patient does admit that she has been under a lot of stress recently and always becomes "depressed" around this time of year since it was her son's favorite time of year who has since passed away.  States now that her other children are grown up she finds that she is alone through most of the holidays which she thinks is contributing to her symptoms.   Past Medical History:  Diagnosis Date  . Anginal pain ([place])     3/8-12/21/10  . Arthritis    Osteoarthritis in BLE knee  . Colon polyp   . GERD (gastroesophageal reflux disease)   . Hiatal hernia   . History of hiatal hernia   . Hypertension    controlled  . Obesity   . Reflux   . Sleep apnea    NO CPAP  . Small vessel disease [facility])     Patient Active Problem List   Diagnosis Date Noted  . Plantar fasciitis 05/03/2016  . Depression, major, recurrent, moderate ([place]) 07/06/2015    Past Surgical History:  Procedure Laterality Date  . ABDOMINAL  HYSTERECTOMY     partial  . CHOLECYSTECTOMY    . COLONOSCOPY W/ POLYPECTOMY     adenomatous colon polyp  . COLONOSCOPY WITH PROPOFOL N/A 12/10/2017   Procedure: COLONOSCOPY WITH PROPOFOL;  Surgeon: Manya Silvas, MD;  Location[facility] ENDOSCOPY;  Service: Endoscopy;  Laterality: N/A;  . ESOPHAGOGASTRODUODENOSCOPY (EGD) WITH PROPOFOL N/A 10/11/2015   Procedure: ESOPHAGOGASTRODUODENOSCOPY (EGD) WITH PROPOFOL;  Surgeon: Manya Silvas, MD;  Location[facility] ENDOSCOPY;  Service: Endoscopy;  Laterality: N/A;  . ESOPHAGOGASTRODUODENOSCOPY (EGD) WITH PROPOFOL N/A 12/10/2017   Procedure: ESOPHAGOGASTRODUODENOSCOPY (EGD) WITH PROPOFOL;  Surgeon: Manya Silvas, MD;  Location: [facility] ENDOSCOPY;  Service: Endoscopy;  Laterality: N/A;  . JOINT REPLACEMENT Right    knee, Oct 2012  . JOINT REPLACEMENT     hopefully getting left partial knee replacement in July 2016  . SAVORY DILATION N/A 10/11/2015   Procedure: SAVORY DILATION;  Surgeon: Manya Silvas, MD;  Location: [facility] ENDOSCOPY;  Service: Endoscopy;  Laterality: N/A;  . SHOULDER ARTHROSCOPY WITH ROTATOR CUFF REPAIR AND SUBACROMIAL DECOMPRESSION Right 06/14/2015   Procedure: SHOULDER ARTHROSCOPY WITH mini open ROTATOR CUFF REPAIR AND SUBACROMIAL DECOMPRESSION, release long head biceps tendon.;  Surgeon: Leanor Kail, MD;  Location: [facility] ORS;  Service: Orthopedics;  Laterality: Right;    Prior to Admission medications   Medication Sig Start Date End Date Taking? Authorizing Provider  albuterol (PROVENTIL HFA;VENTOLIN  HFA) 108 (90 Base) MCG/ACT inhaler Inhale 1-2 puffs into the lungs every 4 (four) hours as needed for wheezing or shortness of breath (cough). 02/25/17   Drenda Freeze, MD  buPROPion (WELLBUTRIN XL) 150 MG 24 hr tablet Take 150 mg by mouth daily.    [provider]  diazepam (VALIUM) 5 MG tablet Take 5 mg by mouth every 8 (eight) hours as needed for anxiety. Reported on 05/02/2016    [provider]   Diethylpropion HCl 25 MG TABS Take by mouth. Reported on 05/02/2016    [provider]  esomeprazole (NEXIUM) 40 MG capsule Take 1 capsule (40 mg total) by mouth daily. 02/25/17 02/25/18  Drenda Freeze, MD  FLUoxetine (PROZAC) 20 MG tablet Take 1 tablet (20 mg total) by mouth daily. Patient not taking: Reported on 10/11/2015 07/06/15   Leone Haven, MD  HYDROcodone-acetaminophen [facility]) 5-325 MG tablet Take 1 tablet by mouth every 6 (six) hours as needed for up to 7 doses for severe pain. 07/24/18   Darel Hong, MD  meloxicam (MOBIC) 15 MG tablet Take 15 mg by mouth daily.    [provider]  methylPREDNISolone (MEDROL DOSEPAK) 4 MG TBPK tablet Take as directed 06/04/16   Trula Slade, DPM  naproxen (NAPROSYN) 500 MG tablet Take 500 mg by mouth at bedtime as needed. Reported on 05/02/2016    [provider]  ondansetron (ZOFRAN) 4 MG tablet Take 1 tablet (4 mg total) by mouth daily as needed for nausea or vomiting. 10/08/16   Earleen Newport, MD  phentermine (ADIPEX-P) 37.5 MG tablet Take 37.5 mg by mouth daily before breakfast.    [provider]  potassium chloride SA (K-DUR,KLOR-CON) 20 MEQ tablet Take 20 mEq by mouth daily. Reported on 05/02/2016    [provider]  promethazine (PHENERGAN) 12.5 MG tablet Take 1 tablet (12.5 mg total) by mouth every 6 (six) hours as needed for nausea or vomiting. 10/12/17   Merlyn Lot, MD  RABEprazole (ACIPHEX) 20 MG tablet Take 20 mg by mouth daily. Reported on 05/02/2016    [provider]  ranitidine (ZANTAC) 150 MG tablet Take 1 tablet (150 mg total) by mouth 2 (two) times daily. 10/12/17 10/12/18  Merlyn Lot, MD  ROXICET 5-325 MG per tablet Take 1-2 tablets by mouth every 6 (six) hours as needed for severe pain. Patient not taking: Reported on 07/03/2015 06/14/15   Leanor Kail, MD  sucralfate (CARAFATE) 1 G tablet Take 1 g by mouth 4 (four) times daily -  with meals  and at bedtime. Reported on 05/02/2016    [provider]  torsemide (DEMADEX) 20 MG tablet Take 1 tablet by mouth two  times daily 07/19/15   Ashok Norris, MD  traMADol (ULTRAM) 50 MG tablet Take 1 tablet (50 mg total) by mouth every 12 (twelve) hours as needed. 06/04/16   Trula Slade, DPM    Allergies  Allergen Reactions  . Ibuprofen     Family History  Problem Relation Age of Onset  . Alcoholism Other        brother  . Arthritis Other        parent  . Breast cancer Other        mother and grandmother  . Hyperlipidemia Father   . Heart disease Father   . Stroke Father   . Hypertension Father   . Diabetes Other        parent and other family member  . Breast  cancer Mother 54  . Breast cancer Maternal Grandmother        young    Social History Social History   Tobacco Use  . Smoking status: Never Smoker  . Smokeless tobacco: Never Used  Substance Use Topics  . Alcohol use: No  . Drug use: No    Review of Systems Constitutional: Negative for fever ENT: Ringing in her right ear (chronic times years) Cardiovascular: Negative for chest pain. Respiratory: Negative for shortness of breath. Gastrointestinal: Negative for abdominal pain, vomiting Musculoskeletal: Negative for musculoskeletal complaints Skin: Negative for skin complaints  Neurological: Moderate headache x3 weeks. All other ROS negative  ____________________________________________   PHYSICAL EXAM:  VITAL SIGNS: ED Triage Vitals  Enc Vitals Group     BP 09/28/18 1854 (!) 153/76     Pulse Rate 09/28/18 1854 66     Resp 09/28/18 1854 18     Temp 09/28/18 1854 97.6 F (36.4 C)     Temp Source 09/28/18 1854 Oral     SpO2 09/28/18 1854 99 %     Weight 09/28/18 1856 259 lb (117.5 kg)     Height 09/28/18 1856 5\' 4"  (1.626 m)     Head Circumference --      Peak Flow --      Pain Score 09/28/18 1856 8     Pain Loc --      Pain Edu? --      Excl. in [place]? --    Constitutional:  Alert and oriented. Well appearing and in no distress. Eyes: Normal exam ENT   Head: Normocephalic and atraumatic.   Mouth/Throat: Mucous membranes are moist. Cardiovascular: Normal rate, regular rhythm. No murmur Respiratory: Normal respiratory effort without tachypnea nor retractions. Breath sounds are clear Gastrointestinal: Soft and nontender. No distention.   Musculoskeletal: Nontender with normal range of motion in all extremities. Neurologic:  Normal speech and language. No gross focal neurologic deficits.  Equal grip strength bilaterally.  No pronator drift.  Intact finger-to-nose testing.  No cranial nerve deficits. Skin:  Skin is warm, dry and intact.  Psychiatric: Mood and affect are normal. Speech and behavior are normal.   ____________________________________________    EKG  EKG viewed and interpreted by myself shows a normal sinus rhythm at 64 bpm with a narrow QRS, normal axis, normal intervals, no concerning ST changes.  ____________________________________________    RADIOLOGY  CT head is negative  ____________________________________________   INITIAL IMPRESSION / ASSESSMENT AND PLAN / ED COURSE  Pertinent labs & imaging results that were available during my care of the patient were reviewed by me and considered in my medical decision making (see chart for details).  Patient presents to the emergency department with headaches as well as intermittent dizziness, memory lapses, increased stress.  Patient's work-up is reassuringly normal, largely normal lab work, CT scan of the head is negative, EKG is reassuring.  Neurological exam is also very reassuring.  Memory lapses appear to be somewhat of a chronic issue in reviewing the patient's records including neurology records for prior visits.  We will treat the patient's headache with Tylenol, Reglan Benadryl and IV fluids.  Currently describes a headache as moderate 5/10 somewhat global of the headache.  I also  recommended that the patient follow-up with her primary care doctor as well as psychiatrist, patient states she has not seen her psychiatrist in several years.  Patient admits to depression-like symptoms with increased stress around the holidays and loneliness which she believes are contributing to  her symptoms.  We will work on treating the patient's headache in the emergency department.  As long as the patient is feeling better we will likely discharge home with PCP/neurology follow-up.  Patient agreeable to plan of care.  Patient states she is feeling much better.  We will discharge home with PCP follow-up.  Patient agreeable to plan of care.  ____________________________________________   FINAL CLINICAL IMPRESSION(S) / ED DIAGNOSES  Headache Dizziness    Harvest Dark, MD 09/28/18 2144

## 2018-09-28 NOTE — ED Triage Notes (Addendum)
Frontal headache x 3 weeks. No sinus drainage. No head injury. Dizzy x 2 weeks. History of head bleed past. Smile symmetrical, grips equal, leg strength equal.

## 2018-09-28 NOTE — ED Notes (Signed)
Pt taken to POV in wheelchair and was able to ambulate to get into POV. Family came to pick pt up because of medications she was given. VSS. NAD. Discharge instructions, RX and follow up reviewed.

## 2018-11-11 ENCOUNTER — Encounter: Payer: Self-pay | Admitting: Family

## 2018-11-11 ENCOUNTER — Ambulatory Visit (INDEPENDENT_AMBULATORY_CARE_PROVIDER_SITE_OTHER): Payer: Medicare HMO

## 2018-11-11 ENCOUNTER — Ambulatory Visit (INDEPENDENT_AMBULATORY_CARE_PROVIDER_SITE_OTHER): Payer: Medicare HMO | Admitting: Family

## 2018-11-11 VITALS — BP 142/78 | HR 85 | Temp 98.5°F | Wt 263.8 lb

## 2018-11-11 DIAGNOSIS — R05 Cough: Secondary | ICD-10-CM | POA: Diagnosis not present

## 2018-11-11 DIAGNOSIS — F331 Major depressive disorder, recurrent, moderate: Secondary | ICD-10-CM

## 2018-11-11 DIAGNOSIS — R053 Chronic cough: Secondary | ICD-10-CM | POA: Insufficient documentation

## 2018-11-11 DIAGNOSIS — A6000 Herpesviral infection of urogenital system, unspecified: Secondary | ICD-10-CM | POA: Diagnosis not present

## 2018-11-11 DIAGNOSIS — I10 Essential (primary) hypertension: Secondary | ICD-10-CM | POA: Diagnosis not present

## 2018-11-11 DIAGNOSIS — K219 Gastro-esophageal reflux disease without esophagitis: Secondary | ICD-10-CM

## 2018-11-11 DIAGNOSIS — R059 Cough, unspecified: Secondary | ICD-10-CM

## 2018-11-11 DIAGNOSIS — R69 Illness, unspecified: Secondary | ICD-10-CM | POA: Diagnosis not present

## 2018-11-11 MED ORDER — VALACYCLOVIR HCL 500 MG PO TABS: 1000.0000 mg | ORAL_TABLET | Freq: Two times a day (BID) | ORAL | 1 refills | Status: AC

## 2018-11-11 MED ORDER — FLUTICASONE PROPIONATE 50 MCG/ACT NA SUSP: 2.0000 | Freq: Every day | NASAL | 3 refills | Status: DC

## 2018-11-11 MED ORDER — AMLODIPINE BESYLATE 5 MG PO TABS: 5.0000 mg | ORAL_TABLET | Freq: Every day | ORAL | 3 refills | Status: DC

## 2018-11-11 NOTE — Assessment & Plan Note (Signed)
Discussed Wellbutrin today however we jointly agreed we would focus on blood pressure ; at 1 week follow-up discussed starting Wellbutrin.

## 2018-11-11 NOTE — Assessment & Plan Note (Signed)
Duration 3 days, suspect viral etiology.  Given Flonase for postnasal drip.  Pending chest x-ray

## 2018-11-11 NOTE — Progress Notes (Signed)
Subjective:    Patient ID: Jacqueline Delgado, female    DOB: April 29, 1951, 68 y.o.   MRN: [id number]  CC: Jacqueline Delgado is a 68 y.o. female who presents today to establish care.    HPI: HTN-  Occasionally problem with fluid in her legs. No h/o CKD.   NO h/o HF.   GERD - stays on the acidphex and symptoms stable. No trouble swallowing.  H/o esophahgeal stricture.    Upper GI and colonoscopy Dr Vira Agar 2019.   Depression- been on zoloft, wellbutrin, prozac in the past. 'food is more comfort.' Over eating.  20 years ago, loss a son, and ever since then, has had depression. Fatigue. No exercise. No si/hi.   Still goes to church.   Cough started 3 days ago, unchanged. Endorses congestion, chills. Sinus pressure. Occasional wheezing. No SOB.   No h/o seizure. No alcohol use.    H/o genital herpes-painful blister like lesions when they come. takes valtrex prn. Needs refill         HISTORY:  Past Medical History:  Diagnosis Date  . Anginal pain ([place])     3/8-12/21/10  . Arthritis    Osteoarthritis in BLE knee  . Colon polyp   . GERD (gastroesophageal reflux disease)   . Hiatal hernia   . History of hiatal hernia   . Hypertension    controlled  . Obesity   . Reflux   . Sleep apnea    NO CPAP  . Small vessel disease [facility])    Past Surgical History:  Procedure Laterality Date  . ABDOMINAL HYSTERECTOMY     partial  . CHOLECYSTECTOMY    . COLONOSCOPY W/ POLYPECTOMY     adenomatous colon polyp  . COLONOSCOPY WITH PROPOFOL N/A 12/10/2017   Procedure: COLONOSCOPY WITH PROPOFOL;  Surgeon: Manya Silvas, MD;  Location: [facility] ENDOSCOPY;  Service: Endoscopy;  Laterality: N/A;  . ESOPHAGOGASTRODUODENOSCOPY (EGD) WITH PROPOFOL N/A 10/11/2015   Procedure: ESOPHAGOGASTRODUODENOSCOPY (EGD) WITH PROPOFOL;  Surgeon: Manya Silvas, MD;  Location: [facility] ENDOSCOPY;  Service: Endoscopy;  Laterality: N/A;  . ESOPHAGOGASTRODUODENOSCOPY (EGD) WITH PROPOFOL N/A 12/10/2017   Procedure:  ESOPHAGOGASTRODUODENOSCOPY (EGD) WITH PROPOFOL;  Surgeon: Manya Silvas, MD;  Location: [facility] ENDOSCOPY;  Service: Endoscopy;  Laterality: N/A;  . JOINT REPLACEMENT Right    knee, Oct 2012  . JOINT REPLACEMENT     hopefully getting left partial knee replacement in July 2016  . SAVORY DILATION N/A 10/11/2015   Procedure: SAVORY DILATION;  Surgeon: Manya Silvas, MD;  Location: [facility] ENDOSCOPY;  Service: Endoscopy;  Laterality: N/A;  . SHOULDER ARTHROSCOPY WITH ROTATOR CUFF REPAIR AND SUBACROMIAL DECOMPRESSION Right 06/14/2015   Procedure: SHOULDER ARTHROSCOPY WITH mini open ROTATOR CUFF REPAIR AND SUBACROMIAL DECOMPRESSION, release long head biceps tendon.;  Surgeon: Leanor Kail, MD;  Location: [facility] ORS;  Service: Orthopedics;  Laterality: Right;   Family History  Problem Relation Age of Onset  . Alcoholism Other        brother  . Arthritis Other        parent  . Breast cancer Other        mother and grandmother  . Hyperlipidemia Father   . Heart disease Father   . Stroke Father   . Hypertension Father   . Diabetes Other        parent and other family member  . Breast cancer Mother 57  . Breast cancer Maternal Grandmother        young  Allergies: Ibuprofen Current Outpatient Medications on File Prior to Visit  Medication Sig Dispense Refill  . HYDROcodone-acetaminophen (NORCO) 5-325 MG tablet Take 1 tablet by mouth every 6 (six) hours as needed for up to 7 doses for severe pain. 7 tablet 0  . meloxicam (MOBIC) 15 MG tablet Take 15 mg by mouth daily.    . RABEprazole (ACIPHEX) 20 MG tablet Take 20 mg by mouth daily. Reported on 05/02/2016    . traMADol (ULTRAM) 50 MG tablet Take 1 tablet (50 mg total) by mouth every 12 (twelve) hours as needed. 30 tablet 0   No current facility-administered medications on file prior to visit.     Social History   Tobacco Use  . Smoking status: Never Smoker  . Smokeless tobacco: Never Used  Substance Use Topics  . Alcohol use: No    . Drug use: No    Review of Systems  Constitutional: Positive for fatigue. Negative for chills and fever.  HENT: Positive for congestion, postnasal drip and sore throat.   Respiratory: Positive for cough and wheezing. Negative for shortness of breath.   Cardiovascular: Positive for leg swelling (rare). Negative for chest pain and palpitations.  Gastrointestinal: Negative for nausea and vomiting.  Psychiatric/Behavioral: Negative for suicidal ideas.      Objective:    BP (!) 142/78 (BP Location: Left Arm, Patient Position: Sitting, Cuff Size: Large)   Pulse 85   Temp 98.5 F (36.9 C)   Wt 263 lb 12.8 oz (119.7 kg)   SpO2 95%   BMI 45.28 kg/m  BP Readings from Last 3 Encounters:  11/11/18 (!) 142/78  09/28/18 139/71  07/23/18 140/80   Wt Readings from Last 3 Encounters:  11/11/18 263 lb 12.8 oz (119.7 kg)  09/28/18 259 lb (117.5 kg)  12/10/17 268 lb (121.6 kg)    Physical Exam Vitals signs reviewed.  Constitutional:      Appearance: She is well-developed.  HENT:     Head: Normocephalic and atraumatic.     Right Ear: Hearing, tympanic membrane, ear canal and external ear normal. No decreased hearing noted. No drainage, swelling or tenderness. No middle ear effusion. No foreign body. Tympanic membrane is not erythematous or bulging.     Left Ear: Hearing, tympanic membrane, ear canal and external ear normal. No decreased hearing noted. No drainage, swelling or tenderness.  No middle ear effusion. No foreign body. Tympanic membrane is not erythematous or bulging.     Nose: Nose normal. No rhinorrhea.     Right Sinus: No maxillary sinus tenderness or frontal sinus tenderness.     Left Sinus: No maxillary sinus tenderness or frontal sinus tenderness.     Mouth/Throat:     Pharynx: Uvula midline. Posterior oropharyngeal erythema present. No oropharyngeal exudate.     Tonsils: No tonsillar abscesses.  Eyes:     Conjunctiva/sclera: Conjunctivae normal.  Cardiovascular:      Rate and Rhythm: Regular rhythm.     Pulses: Normal pulses.     Heart sounds: Normal heart sounds.  Pulmonary:     Effort: Pulmonary effort is normal.     Breath sounds: Normal breath sounds. No wheezing, rhonchi or rales.  Musculoskeletal:     Right lower leg: No edema.     Left lower leg: No edema.  Lymphadenopathy:     Head:     Right side of head: No submental, submandibular, tonsillar, preauricular, posterior auricular or occipital adenopathy.     Left side of head: No submental, submandibular,  tonsillar, preauricular, posterior auricular or occipital adenopathy.     Cervical: No cervical adenopathy.  Skin:    General: Skin is warm and dry.  Neurological:     Mental Status: She is alert.  Psychiatric:        Speech: Speech normal.        Behavior: Behavior normal.        Thought Content: Thought content normal.        Assessment & Plan:   Problem List Items Addressed This Visit      Cardiovascular and Mediastinum   Essential hypertension    Elevated today.  Discussed  torsemide as not first-line antihypertensive.  No known history of heart failure, no significant edema, pending chest x-ray to evaluate for LVH.  will discontinue this medication along with potassium supplementation.  She will start amlodipine.  Close follow-up in 1 week.      Relevant Medications   amLODipine (NORVASC) 5 MG tablet     Digestive   GERD (gastroesophageal reflux disease)     Genitourinary   Genital herpes simplex    Valtrex given to be used as needed      Relevant Medications   valACYclovir (VALTREX) 500 MG tablet     Other   Depression, major, recurrent, moderate (HCC)    Discussed Wellbutrin today however we jointly agreed we would focus on blood pressure ; at 1 week follow-up discussed starting Wellbutrin.      Cough - Primary    Duration 3 days, suspect viral etiology.  Given Flonase for postnasal drip.  Pending chest x-ray      Relevant Medications   fluticasone  (FLONASE) 50 MCG/ACT nasal spray   Other Relevant Orders   DG Chest 2 View       I have discontinued Min C. Lyall's naproxen, potassium chloride SA, diazepam, ROXICET, Diethylpropion HCl, FLUoxetine, torsemide, sucralfate, methylPREDNISolone, ondansetron, albuterol, esomeprazole, ranitidine, promethazine, buPROPion, phentermine, and triamterene-hydrochlorothiazide. I am also having her start on valACYclovir, amLODipine, and fluticasone. Additionally, I am having her maintain her RABEprazole, traMADol, meloxicam, and HYDROcodone-acetaminophen.   Meds ordered this encounter  Medications  . valACYclovir (VALTREX) 500 MG tablet    Sig: Take 2 tablets (1,000 mg total) by mouth 2 (two) times daily for 3 days. asap w/in 24h sx onset    Dispense:  30 tablet    Refill:  1    Order Specific Question:   Supervising Provider    Answer:   Deborra Medina L [[id number]]  . amLODipine (NORVASC) 5 MG tablet    Sig: Take 1 tablet (5 mg total) by mouth daily.    Dispense:  90 tablet    Refill:  3    Order Specific Question:   Supervising Provider    Answer:   Deborra Medina L [[id number]]  . fluticasone (FLONASE) 50 MCG/ACT nasal spray    Sig: Place 2 sprays into both nostrils daily.    Dispense:  16 g    Refill:  3    Order Specific Question:   Supervising Provider    Answer:   Crecencio Mc [[id number]]    Return precautions given.   Risks, benefits, and alternatives of the medications and treatment plan prescribed today were discussed, and patient expressed understanding.   Education regarding symptom management and diagnosis given to patient on AVS.  Continue to follow with Clemmie Krill Lynnell Jude, MD for routine health maintenance.   [name] and I agreed with plan.   Mable Paris, FNP

## 2018-11-11 NOTE — Assessment & Plan Note (Addendum)
Elevated today.  Discussed  torsemide as not first-line antihypertensive.  No known history of heart failure, no significant edema, pending chest x-ray to evaluate for LVH.  will discontinue this medication along with potassium supplementation.  She will start amlodipine.  Close follow-up in 1 week.

## 2018-11-11 NOTE — Assessment & Plan Note (Signed)
Valtrex given to be used as needed

## 2018-11-11 NOTE — Patient Instructions (Addendum)
Stop demadex and also potassium supplement. Start amlodipine.   Monitor blood pressure,  Goal is less than 120/80, based on newest guidelines; if persistently higher, please make sooner follow up appointment so we can recheck you blood pressure and manage medications  No diclofenac, mobic  Aleve, Motrin, ibuprofen etc  as they can raise blood pressure.  Take tylenol arthritis for pain.

## 2018-11-16 ENCOUNTER — Emergency Department: Payer: Medicare HMO

## 2018-11-16 ENCOUNTER — Encounter: Payer: Self-pay | Admitting: Emergency Medicine

## 2018-11-16 ENCOUNTER — Emergency Department
Admission: EM | Admit: 2018-11-16 | Discharge: 2018-11-16 | Disposition: A | Discharge status: Home / Self Care | Payer: Medicare HMO | Attending: Emergency Medicine | Admitting: Emergency Medicine

## 2018-11-16 DIAGNOSIS — L03114 Cellulitis of left upper limb: Secondary | ICD-10-CM | POA: Diagnosis not present

## 2018-11-16 DIAGNOSIS — Z96651 Presence of right artificial knee joint: Secondary | ICD-10-CM | POA: Insufficient documentation

## 2018-11-16 DIAGNOSIS — I10 Essential (primary) hypertension: Secondary | ICD-10-CM | POA: Diagnosis not present

## 2018-11-16 DIAGNOSIS — Z79899 Other long term (current) drug therapy: Secondary | ICD-10-CM | POA: Insufficient documentation

## 2018-11-16 DIAGNOSIS — M79642 Pain in left hand: Secondary | ICD-10-CM | POA: Diagnosis not present

## 2018-11-16 LAB — BASIC METABOLIC PANEL
Anion gap: 5 (ref 5–15)
BUN: 13 mg/dL (ref 8–23)
CALCIUM: 9.1 mg/dL (ref 8.9–10.3)
CO2: 27 mmol/L (ref 22–32)
Chloride: 107 mmol/L (ref 98–111)
Creatinine, Ser: 0.79 mg/dL (ref 0.44–1.00)
GFR calc Af Amer: >60 mL/min (ref >60)
GFR calc non Af Amer: >60 mL/min (ref >60)
Glucose, Bld: 92 mg/dL (ref 70–99)
Potassium: 3.6 mmol/L (ref 3.5–5.1)
Sodium: 139 mmol/L (ref 135–145)

## 2018-11-16 LAB — URIC ACID: Uric Acid, Serum: 3.7 mg/dL (ref 2.5–7.1)

## 2018-11-16 LAB — CBC WITH DIFFERENTIAL/PLATELET
Abs Immature Granulocytes: 0.02 10*3/uL (ref 0.00–0.07)
BASOS ABS: 0.1 10*3/uL (ref 0.0–0.1)
Basophils Relative: 1 %
EOS ABS: 0.2 10*3/uL (ref 0.0–0.5)
Eosinophils Relative: 3 %
HCT: 35.6 % — ABNORMAL LOW (ref 36.0–46.0)
Hemoglobin: 10.9 g/dL — ABNORMAL LOW (ref 12.0–15.0)
Immature Granulocytes: 0 %
Lymphocytes Relative: 30 %
Lymphs Abs: 2 10*3/uL (ref 0.7–4.0)
MCH: 25.3 pg — ABNORMAL LOW (ref 26.0–34.0)
MCHC: 30.6 g/dL (ref 30.0–36.0)
MCV: 82.6 fL (ref 80.0–100.0)
Monocytes Absolute: 0.6 10*3/uL (ref 0.1–1.0)
Monocytes Relative: 9 %
NRBC: 0 % (ref 0.0–0.2)
Neutro Abs: 4 10*3/uL (ref 1.7–7.7)
Neutrophils Relative %: 57 %
Platelets: 250 10*3/uL (ref 150–400)
RBC: 4.31 MIL/uL (ref 3.87–5.11)
RDW: 15.7 % — AB (ref 11.5–15.5)
WBC: 6.8 10*3/uL (ref 4.0–10.5)

## 2018-11-16 MED ORDER — CEPHALEXIN 500 MG PO CAPS: 500.0000 mg | ORAL_CAPSULE | Freq: Three times a day (TID) | ORAL | 0 refills | Status: DC

## 2018-11-16 MED ORDER — SULFAMETHOXAZOLE-TRIMETHOPRIM 800-160 MG PO TABS: 1.0000 | ORAL_TABLET | Freq: Two times a day (BID) | ORAL | 0 refills | Status: DC

## 2018-11-16 MED ORDER — PIPERACILLIN-TAZOBACTAM 3.375 G IVPB 30 MIN
3.3750 g | Freq: Once | INTRAVENOUS | Status: AC
  Administered 2018-11-16: 3.375 g via INTRAVENOUS
  Filled 2018-11-16: qty 50

## 2018-11-16 MED ORDER — OXYCODONE-ACETAMINOPHEN 5-325 MG PO TABS
1.0000 | ORAL_TABLET | Freq: Once | ORAL | Status: AC
  Administered 2018-11-16: 1 via ORAL
  Filled 2018-11-16: qty 1

## 2018-11-16 MED ORDER — MORPHINE SULFATE (PF) 4 MG/ML IV SOLN
4.0000 mg | Freq: Once | INTRAVENOUS | Status: AC
  Administered 2018-11-16: 4 mg via INTRAVENOUS
  Filled 2018-11-16: qty 1

## 2018-11-16 MED ORDER — OXYCODONE-ACETAMINOPHEN 5-325 MG PO TABS: 1.0000 | ORAL_TABLET | ORAL | 0 refills | Status: DC | PRN

## 2018-11-16 MED ORDER — FLUCONAZOLE 150 MG PO TABS: ORAL_TABLET | ORAL | 0 refills | Status: DC

## 2018-11-16 MED ORDER — ONDANSETRON HCL 4 MG/2ML IJ SOLN
4.0000 mg | Freq: Once | INTRAMUSCULAR | Status: AC
  Administered 2018-11-16: 4 mg via INTRAVENOUS
  Filled 2018-11-16: qty 2

## 2018-11-16 NOTE — Progress Notes (Signed)
First appointments are available at 7:15 & the latest appointments are at 5. Of course they are located in [place], but receptionist I spoke with stated that they are looking to expand hopefully to [place].

## 2018-11-16 NOTE — ED Notes (Signed)
See triage note  Presents with left hand/wrist pain  Pain started about 1 week ago  Min swelling noted  Good pulses  Denies any injury but area is very tender to touch

## 2018-11-16 NOTE — Discharge Instructions (Addendum)
Follow-up with your regular doctor as needed.  Follow-up with Dr. Rudene Christians if not better in 3 days.  Return to the emergency department if you are worsening.  Take the antibiotics as prescribed.  If the pain is increasing and the redness is increasing please return as soon as possible to the emergency department.

## 2018-11-16 NOTE — ED Provider Notes (Signed)
[facility] Emergency Department Provider Note  ____________________________________________   First MD Initiated Contact with Patient 11/16/18 1810     (approximate)  I have reviewed the triage vital signs and the nursing notes.   HISTORY  Chief Complaint Hand Pain    HPI Jacqueline Delgado is a 68 y.o. female presents emergency department with left hand/wrist pain.  States pain started about a week ago and she noticed a red streak bleeding from the thumb up her wrist to the forearm today.  She states pain is severe.  She does not remember an injury but did have her nails done a couple of weeks ago.  She denies any fever or chills.  She denies any recent IV, blood draws, or IV drug use.   Past Medical History:  Diagnosis Date  . Anginal pain ([place])     3/8-12/21/10  . Arthritis    Osteoarthritis in BLE knee  . Colon polyp   . GERD (gastroesophageal reflux disease)   . Hiatal hernia   . History of hiatal hernia   . Hypertension    controlled  . Obesity   . Reflux   . Sleep apnea    NO CPAP  . Small vessel disease [facility])     Patient Active Problem List   Diagnosis Date Noted  . Cough 11/11/2018  . Genital herpes simplex 11/11/2018  . Essential hypertension 11/11/2018  . GERD (gastroesophageal reflux disease) 11/11/2018  . Plantar fasciitis 05/03/2016  . Depression, major, recurrent, moderate ([place]) 07/06/2015    Past Surgical History:  Procedure Laterality Date  . ABDOMINAL HYSTERECTOMY     partial  . CHOLECYSTECTOMY    . COLONOSCOPY W/ POLYPECTOMY     adenomatous colon polyp  . COLONOSCOPY WITH PROPOFOL N/A 12/10/2017   Procedure: COLONOSCOPY WITH PROPOFOL;  Surgeon: Manya Silvas, MD;  Location: [facility] ENDOSCOPY;  Service: Endoscopy;  Laterality: N/A;  . ESOPHAGOGASTRODUODENOSCOPY (EGD) WITH PROPOFOL N/A 10/11/2015   Procedure: ESOPHAGOGASTRODUODENOSCOPY (EGD) WITH PROPOFOL;  Surgeon: Manya Silvas, MD;  Location: [facility] ENDOSCOPY;   Service: Endoscopy;  Laterality: N/A;  . ESOPHAGOGASTRODUODENOSCOPY (EGD) WITH PROPOFOL N/A 12/10/2017   Procedure: ESOPHAGOGASTRODUODENOSCOPY (EGD) WITH PROPOFOL;  Surgeon: Manya Silvas, MD;  Location: [facility] ENDOSCOPY;  Service: Endoscopy;  Laterality: N/A;  . JOINT REPLACEMENT Right    knee, Oct 2012  . JOINT REPLACEMENT     hopefully getting left partial knee replacement in July 2016  . SAVORY DILATION N/A 10/11/2015   Procedure: SAVORY DILATION;  Surgeon: Manya Silvas, MD;  Location: [facility] ENDOSCOPY;  Service: Endoscopy;  Laterality: N/A;  . SHOULDER ARTHROSCOPY WITH ROTATOR CUFF REPAIR AND SUBACROMIAL DECOMPRESSION Right 06/14/2015   Procedure: SHOULDER ARTHROSCOPY WITH mini open ROTATOR CUFF REPAIR AND SUBACROMIAL DECOMPRESSION, release long head biceps tendon.;  Surgeon: Leanor Kail, MD;  Location: [facility] ORS;  Service: Orthopedics;  Laterality: Right;    Prior to Admission medications   Medication Sig Start Date End Date Taking? Authorizing Provider  amLODipine (NORVASC) 5 MG tablet Take 1 tablet (5 mg total) by mouth daily. 11/11/18   Burnard Hawthorne, FNP  cephALEXin (KEFLEX) 500 MG capsule Take 1 capsule (500 mg total) by mouth 3 (three) times daily. 11/16/18   Caryn Section Linden Dolin, PA-C  fluconazole (DIFLUCAN) 150 MG tablet Take one now and one in a week 11/16/18   Caryn Section, Linden Dolin, PA-C  fluticasone [facility]) 50 MCG/ACT nasal spray Place 2 sprays into both nostrils daily. 11/11/18   Burnard Hawthorne, FNP  oxyCODONE-acetaminophen (PERCOCET/ROXICET) 5-325 MG tablet Take 1 tablet by mouth every 4 (four) hours as needed for severe pain. 11/16/18   Caryn Section Linden Dolin, PA-C  RABEprazole (ACIPHEX) 20 MG tablet Take 20 mg by mouth daily. Reported on 05/02/2016    [provider]  sulfamethoxazole-trimethoprim (BACTRIM DS,SEPTRA DS) 800-160 MG tablet Take 1 tablet by mouth 2 (two) times daily. 11/16/18   Versie Starks, PA-C    Allergies Ibuprofen  Family History  Problem Relation  Age of Onset  . Alcoholism Other        brother  . Arthritis Other        parent  . Breast cancer Other        mother and grandmother  . Hyperlipidemia Father   . Heart disease Father   . Stroke Father   . Hypertension Father   . Diabetes Other        parent and other family member  . Breast cancer Mother 24  . Breast cancer Maternal Grandmother        young    Social History Social History   Tobacco Use  . Smoking status: Never Smoker  . Smokeless tobacco: Never Used  Substance Use Topics  . Alcohol use: No  . Drug use: No    Review of Systems  Constitutional: No fever/chills Eyes: No visual changes. ENT: No sore throat. Respiratory: Denies cough Genitourinary: Negative for dysuria. Musculoskeletal: Negative for back pain.  Positive for left hand and wrist pain Skin: Negative for rash.    ____________________________________________   PHYSICAL EXAM:  VITAL SIGNS: ED Triage Vitals  Enc Vitals Group     BP 11/16/18 1848 138/76     Pulse Rate 11/16/18 1848 80     Resp 11/16/18 1848 20     Temp 11/16/18 1848 99 F (37.2 C)     Temp Source 11/16/18 1848 Oral     SpO2 11/16/18 1848 96 %     Weight 11/16/18 1741 264 lb (119.7 kg)     Height 11/16/18 1741 '5\' 4"'  (1.626 m)     Head Circumference --      Peak Flow --      Pain Score 11/16/18 1741 10     Pain Loc --      Pain Edu? --      Excl. in [place]? --     Constitutional: Alert and oriented. Well appearing and in no acute distress. Eyes: Conjunctivae are normal.  Head: Atraumatic. Nose: No congestion/rhinnorhea. Mouth/Throat: Mucous membranes are moist.   Neck:  supple no lymphadenopathy noted Cardiovascular: Normal rate, regular rhythm.  Respiratory: Normal respiratory effort.  No retractions, GU: deferred Musculoskeletal: Left thumb and thenar muscle are very tender to palpation, redness is noted along the thumb thenar muscle and a red streak extending up the forearm.  Neurovascular is intact.     Neurologic:  Normal speech and language.  Skin:  Skin is warm, dry and intact.  Redness is noted at the left hand Psychiatric: Mood and affect are normal. Speech and behavior are normal.  ____________________________________________   LABS (all labs ordered are listed, but only abnormal results are displayed)  Labs Reviewed  CBC WITH DIFFERENTIAL/PLATELET - Abnormal; Notable for the following components:      Result Value   Hemoglobin 10.9 (*)    HCT 35.6 (*)    MCH 25.3 (*)    RDW 15.7 (*)    All other components within normal limits  BASIC METABOLIC PANEL  URIC ACID   ____________________________________________   ____________________________________________  RADIOLOGY  Tray of the left hand shows soft tissue swelling but no foreign body, osteomyelitis, or fracture  ____________________________________________   PROCEDURES  Procedure(s) performed: Saline lock, Zosyn IV, morphine 4 mg IV, Zofran 4 mg IV, Percocet 1 p.o.  Procedures    ____________________________________________   INITIAL IMPRESSION / ASSESSMENT AND PLAN / ED COURSE  Pertinent labs & imaging results that were available during my care of the patient were reviewed by me and considered in my medical decision making (see chart for details).   Patient is a 68 year old female presents emergency department with left hand pain.  Physical exam shows cellulitis along the thenar muscle left thumb and a streak extending up the forearm  Explained the findings to the patient.  Explained her this is a cellulitis and that we need to address with antibiotics.  Due to the severe amount of pain x-ray was also ordered.  X-ray of the left hand is negative Saline lock, Zosyn IV, morphine 4 mg IV, and Zofran 4 mg IV were given.  Nursing staff notified me that she states the morphine did not help her pain so she was given a Percocet p.o.  Explained all of the lab findings to the patient.  Her CBC, met C, and uric  acid are all normal  She is to follow-up with her regular doctor if no improvement in 2 to 3 days.  Return emergency department for worsening.  She is given a prescription for Septra and Keflex.  Diflucan as she develops yeast with antibiotics.  Percocet for pain.  She was discharged in stable condition.     As part of my medical decision making, I reviewed the following data within the [place] notes reviewed and incorporated, Labs reviewed CBC is normal, metabolic panel is normal, uric acid is normal, Old chart reviewed, Radiograph reviewed x-ray of the left hand shows soft tissue swelling only, Notes from prior ED visits and  Controlled Substance Database  ____________________________________________   FINAL CLINICAL IMPRESSION(S) / ED DIAGNOSES  Final diagnoses:  Cellulitis of left upper extremity      NEW MEDICATIONS STARTED DURING THIS VISIT:  New Prescriptions   CEPHALEXIN (KEFLEX) 500 MG CAPSULE    Take 1 capsule (500 mg total) by mouth 3 (three) times daily.   FLUCONAZOLE (DIFLUCAN) 150 MG TABLET    Take one now and one in a week   OXYCODONE-ACETAMINOPHEN (PERCOCET/ROXICET) 5-325 MG TABLET    Take 1 tablet by mouth every 4 (four) hours as needed for severe pain.   SULFAMETHOXAZOLE-TRIMETHOPRIM (BACTRIM DS,SEPTRA DS) 800-160 MG TABLET    Take 1 tablet by mouth 2 (two) times daily.     Note:  This document was prepared using Dragon voice recognition software and may include unintentional dictation errors.    Versie Starks, PA-C 11/16/18 2020    Schuyler Amor, MD 11/16/18 [phone]

## 2018-11-16 NOTE — ED Triage Notes (Signed)
C/O left hand pain and swelling x 7 days.

## 2018-11-18 ENCOUNTER — Ambulatory Visit: Payer: Medicare HMO

## 2018-11-18 ENCOUNTER — Other Ambulatory Visit: Payer: Self-pay | Admitting: Physician Assistant

## 2018-11-18 ENCOUNTER — Ambulatory Visit
Admission: RE | Admit: 2018-11-18 | Discharge: 2018-11-18 | Disposition: A | Discharge status: Home / Self Care | Payer: Medicare HMO | Source: Ambulatory Visit | Attending: Physician Assistant | Admitting: Physician Assistant

## 2018-11-18 ENCOUNTER — Ambulatory Visit (INDEPENDENT_AMBULATORY_CARE_PROVIDER_SITE_OTHER): Payer: Medicare HMO | Admitting: Family

## 2018-11-18 VITALS — BP 118/72 | HR 96 | Temp 97.7°F | Resp 18 | Ht 64.0 in | Wt 268.0 lb

## 2018-11-18 DIAGNOSIS — I10 Essential (primary) hypertension: Secondary | ICD-10-CM

## 2018-11-18 DIAGNOSIS — L03114 Cellulitis of left upper limb: Secondary | ICD-10-CM | POA: Diagnosis not present

## 2018-11-18 DIAGNOSIS — R2232 Localized swelling, mass and lump, left upper limb: Secondary | ICD-10-CM

## 2018-11-18 DIAGNOSIS — M1812 Unilateral primary osteoarthritis of first carpometacarpal joint, left hand: Secondary | ICD-10-CM | POA: Diagnosis not present

## 2018-11-18 MED ORDER — IOPAMIDOL (ISOVUE-300) INJECTION 61%
100.0000 mL | Freq: Once | INTRAVENOUS | Status: AC | PRN
  Administered 2018-11-18: 100 mL via INTRAVENOUS

## 2018-11-18 NOTE — Assessment & Plan Note (Addendum)
Patient is nontoxic in appearance,concern of septic joint.  Lack of anticipated response from oral antibiotics.  Although x-ray had been normal 4 days ago, likely patient needs MRI of joint.  Advised patient first to go to [facility] after speaking with [facility] triage however  Dr. Harlow Mares, orthopedic, advised that they have anything aside from x-ray onsite after speaking on phone and advised that they can see her, and refer her to ED if necessary. I called patient and advised her that likely she is to be seen in the emergency room.  She is agreeable to this.  At the time, she had not decided whether she go to [facility] and be referred emergency room if needed OR  if she would directly to emergency room.  She will let me know how she is doing.

## 2018-11-18 NOTE — Patient Instructions (Signed)
Concern for septic joint  Please go to walk in orthopedic clinic immediately.   Information below:  [facility] [address]  M-F 1-7:30pm  [phone]  Let me know how you are doing

## 2018-11-18 NOTE — Progress Notes (Signed)
Subjective:    Patient ID: Jacqueline Delgado, female    DOB: [birth year], 68 y.o.   MRN: [id number]  CC: Jacqueline Delgado is a 68 y.o. female who presents today for follow up.   HPI:  Left thumb pain is better, swelling is worse.  Red streak is better.  Describes a thumb pain she had noticed slightly couple weeks ago, it worsened suddenly over the weekend became excruciating 4 days ago when she presented the emergency room.  NO fever, chills, rash, lacerations.  No history of gout  HTN- on amlodipine. No CP, leg swelling.  Cough- improved.   Seen emergency room 11/17/2019 for cellulitis of left upper extremity. Red streak. Started on Septra, Keflex.  Given Percocet for pain.    Advised follow-up Dr. Alferd Patee, orthopedics.  XR left hand- soft tissue swelling HISTORY:  Past Medical History:  Diagnosis Date  . Anginal pain ([place])     3/8-12/21/10  . Arthritis    Osteoarthritis in BLE knee  . Colon polyp   . GERD (gastroesophageal reflux disease)   . Hiatal hernia   . History of hiatal hernia   . Hypertension    controlled  . Obesity   . Reflux   . Sleep apnea    NO CPAP  . Small vessel disease [facility])    Past Surgical History:  Procedure Laterality Date  . ABDOMINAL HYSTERECTOMY     partial  . CHOLECYSTECTOMY    . COLONOSCOPY W/ POLYPECTOMY     adenomatous colon polyp  . COLONOSCOPY WITH PROPOFOL N/A 12/10/2017   Procedure: COLONOSCOPY WITH PROPOFOL;  Surgeon: Manya Silvas, MD;  Location: [facility] ENDOSCOPY;  Service: Endoscopy;  Laterality: N/A;  . ESOPHAGOGASTRODUODENOSCOPY (EGD) WITH PROPOFOL N/A 10/11/2015   Procedure: ESOPHAGOGASTRODUODENOSCOPY (EGD) WITH PROPOFOL;  Surgeon: Manya Silvas, MD;  Location: [facility] ENDOSCOPY;  Service: Endoscopy;  Laterality: N/A;  . ESOPHAGOGASTRODUODENOSCOPY (EGD) WITH PROPOFOL N/A 12/10/2017   Procedure: ESOPHAGOGASTRODUODENOSCOPY (EGD) WITH PROPOFOL;  Surgeon: Manya Silvas, MD;  Location: [facility] ENDOSCOPY;  Service: Endoscopy;   Laterality: N/A;  . JOINT REPLACEMENT Right    knee, Oct 2012  . JOINT REPLACEMENT     hopefully getting left partial knee replacement in July 2016  . SAVORY DILATION N/A 10/11/2015   Procedure: SAVORY DILATION;  Surgeon: Manya Silvas, MD;  Location: [facility] ENDOSCOPY;  Service: Endoscopy;  Laterality: N/A;  . SHOULDER ARTHROSCOPY WITH ROTATOR CUFF REPAIR AND SUBACROMIAL DECOMPRESSION Right 06/14/2015   Procedure: SHOULDER ARTHROSCOPY WITH mini open ROTATOR CUFF REPAIR AND SUBACROMIAL DECOMPRESSION, release long head biceps tendon.;  Surgeon: Leanor Kail, MD;  Location: [facility] ORS;  Service: Orthopedics;  Laterality: Right;   Family History  Problem Relation Age of Onset  . Alcoholism Other        brother  . Arthritis Other        parent  . Breast cancer Other        mother and grandmother  . Hyperlipidemia Father   . Heart disease Father   . Stroke Father   . Hypertension Father   . Diabetes Other        parent and other family member  . Breast cancer Mother 86  . Breast cancer Maternal Grandmother        young    Allergies: Ibuprofen Current Outpatient Medications on File Prior to Visit  Medication Sig Dispense Refill  . amLODipine (NORVASC) 5 MG tablet Take 1 tablet (5 mg total) by mouth daily. 90 tablet 3  .  cephALEXin (KEFLEX) 500 MG capsule Take 1 capsule (500 mg total) by mouth 3 (three) times daily. 21 capsule 0  . fluconazole (DIFLUCAN) 150 MG tablet Take one now and one in a week 2 tablet 0  . fluticasone (FLONASE) 50 MCG/ACT nasal spray Place 2 sprays into both nostrils daily. 16 g 3  . oxyCODONE-acetaminophen (PERCOCET/ROXICET) 5-325 MG tablet Take 1 tablet by mouth every 4 (four) hours as needed for severe pain. 20 tablet 0  . RABEprazole (ACIPHEX) 20 MG tablet Take 20 mg by mouth daily. Reported on 05/02/2016    . sulfamethoxazole-trimethoprim (BACTRIM DS,SEPTRA DS) 800-160 MG tablet Take 1 tablet by mouth 2 (two) times daily. 14 tablet 0   No current  facility-administered medications on file prior to visit.     Social History   Tobacco Use  . Smoking status: Never Smoker  . Smokeless tobacco: Never Used  Substance Use Topics  . Alcohol use: No  . Drug use: No    Review of Systems  Constitutional: Negative for chills and fever.  Respiratory: Negative for cough.   Cardiovascular: Negative for chest pain, palpitations and leg swelling.  Gastrointestinal: Negative for nausea and vomiting.  Musculoskeletal: Positive for arthralgias and joint swelling.  Skin: Negative for rash and wound.      Objective:    BP 118/72   Pulse 96   Temp 97.7 F (36.5 C) (Oral)   Resp 18   Ht 5\' 4"  (1.626 m)   Wt 268 lb (121.6 kg)   SpO2 91%   BMI 46.00 kg/m  BP Readings from Last 3 Encounters:  11/18/18 118/72  11/16/18 (!) 157/86  11/11/18 (!) 142/78   Wt Readings from Last 3 Encounters:  11/18/18 268 lb (121.6 kg)  11/16/18 264 lb (119.7 kg)  11/11/18 263 lb 12.8 oz (119.7 kg)    Physical Exam Vitals signs reviewed.  Constitutional:      Appearance: She is well-developed.  Eyes:     Conjunctiva/sclera: Conjunctivae normal.  Cardiovascular:     Rate and Rhythm: Normal rate and regular rhythm.     Pulses: Normal pulses.     Heart sounds: Normal heart sounds.  Pulmonary:     Effort: Pulmonary effort is normal.     Breath sounds: Normal breath sounds. No wheezing, rhonchi or rales.  Musculoskeletal:     Left wrist: She exhibits normal range of motion and no bony tenderness.     Left hand: She exhibits decreased range of motion, tenderness and swelling. She exhibits no bony tenderness. Normal sensation noted. Decreased strength noted.       Hands:     Right lower leg: No edema.     Left lower leg: No edema.     Comments: Notable soft tissue swelling left dorsal aspect of hand, most notable over CMC joint.  Increased warmth.  No laceration, rash appreciated.  No streaking.  Skin:    General: Skin is warm and dry.    Neurological:     Mental Status: She is alert.  Psychiatric:        Speech: Speech normal.        Behavior: Behavior normal.        Thought Content: Thought content normal.        Assessment & Plan:   Problem List Items Addressed This Visit      Cardiovascular and Mediastinum   Essential hypertension    Improved. No changes to regimen.  Other   Localized swelling of finger of left hand - Primary    Patient is nontoxic in appearance,concern of septic joint.  Lack of anticipated response from oral antibiotics.  Although x-ray had been normal 4 days ago, likely patient needs MRI of joint.  Advised patient first to go to [facility] after speaking with [facility] triage however  Dr. Harlow Mares, orthopedic, advised that they have anything aside from x-ray onsite after speaking on phone and advised that they can see her, and refer her to ED if necessary. I called patient and advised her that likely she is to be seen in the emergency room.  She is agreeable to this.  At the time, she had not decided whether she go to [facility] and be referred emergency room if needed OR  if she would directly to emergency room.  She will let me know how she is doing.           I am having Rilee Knoll. Motter maintain her RABEprazole, amLODipine, fluticasone, cephALEXin, sulfamethoxazole-trimethoprim, oxyCODONE-acetaminophen, and fluconazole.   No orders of the defined types were placed in this encounter.   Return precautions given.   Risks, benefits, and alternatives of the medications and treatment plan prescribed today were discussed, and patient expressed understanding.   Education regarding symptom management and diagnosis given to patient on AVS.  Continue to follow with System, Pcp Not In for routine health maintenance.   [name] and I agreed with plan.   Mable Paris, FNP

## 2018-11-18 NOTE — Assessment & Plan Note (Signed)
Improved. No changes to regimen.

## 2018-11-19 DIAGNOSIS — M109 Gout, unspecified: Secondary | ICD-10-CM | POA: Diagnosis not present

## 2018-11-24 NOTE — Progress Notes (Signed)
I spoke with patient & she would like to pursue weight loss management. She has made an appointment with you to come in to discuss Wellbutrin & wants A1C checked.

## 2018-11-25 NOTE — Progress Notes (Signed)
Subjective:    Patient ID: Jacqueline Delgado, female    DOB: 1951-02-05, 68 y.o.   MRN: [id number]  CC: Jacqueline Delgado is a 68 y.o. female who presents today for follow up.   HPI: Weight bothers her the most. Food become comfortable. No energy. Hard to exercise due to knee pain.   No h/o seizures, eating disoder. No alcohol use.   Continues to have some productive cough, unchanged. Feels like 'leftover' from being sick. Tickle at bedtime. Tried humidified.  Non smoker. Tried flonase. On acidphex. No post nasal drip, wheezing, sob, fever, facial pain.  No h/o asthma.   HTN-compliant medication.  No chest pain  H/o gout left hand, swelling resolved.   OSA- Stopped using cipap 9 years ago. Wakes up every night approx every 2 hours. Anxiety will affect sleep.  No depression.   CXR 10/2018 normal.   HISTORY:  Past Medical History:  Diagnosis Date  . Anginal pain ([place])     3/8-12/21/10  . Arthritis    Osteoarthritis in BLE knee  . Colon polyp   . GERD (gastroesophageal reflux disease)   . Hiatal hernia   . History of hiatal hernia   . Hypertension    controlled  . Obesity   . Reflux   . Sleep apnea    NO CPAP  . Small vessel disease [facility])    Past Surgical History:  Procedure Laterality Date  . ABDOMINAL HYSTERECTOMY     partial  . CHOLECYSTECTOMY    . COLONOSCOPY W/ POLYPECTOMY     adenomatous colon polyp  . COLONOSCOPY WITH PROPOFOL N/A 12/10/2017   Procedure: COLONOSCOPY WITH PROPOFOL;  Surgeon: Manya Silvas, MD;  Location: [facility] ENDOSCOPY;  Service: Endoscopy;  Laterality: N/A;  . ESOPHAGOGASTRODUODENOSCOPY (EGD) WITH PROPOFOL N/A 10/11/2015   Procedure: ESOPHAGOGASTRODUODENOSCOPY (EGD) WITH PROPOFOL;  Surgeon: Manya Silvas, MD;  Location: [facility] ENDOSCOPY;  Service: Endoscopy;  Laterality: N/A;  . ESOPHAGOGASTRODUODENOSCOPY (EGD) WITH PROPOFOL N/A 12/10/2017   Procedure: ESOPHAGOGASTRODUODENOSCOPY (EGD) WITH PROPOFOL;  Surgeon: Manya Silvas, MD;  Location: [facility]  ENDOSCOPY;  Service: Endoscopy;  Laterality: N/A;  . JOINT REPLACEMENT Right    knee, Oct 2012  . JOINT REPLACEMENT     hopefully getting left partial knee replacement in July 2016  . SAVORY DILATION N/A 10/11/2015   Procedure: SAVORY DILATION;  Surgeon: Manya Silvas, MD;  Location: [facility] ENDOSCOPY;  Service: Endoscopy;  Laterality: N/A;  . SHOULDER ARTHROSCOPY WITH ROTATOR CUFF REPAIR AND SUBACROMIAL DECOMPRESSION Right 06/14/2015   Procedure: SHOULDER ARTHROSCOPY WITH mini open ROTATOR CUFF REPAIR AND SUBACROMIAL DECOMPRESSION, release long head biceps tendon.;  Surgeon: Leanor Kail, MD;  Location: [facility] ORS;  Service: Orthopedics;  Laterality: Right;   Family History  Problem Relation Age of Onset  . Alcoholism Other        brother  . Arthritis Other        parent  . Breast cancer Other        mother and grandmother  . Hyperlipidemia Father   . Heart disease Father   . Stroke Father   . Hypertension Father   . Diabetes Other        parent and other family member  . Breast cancer Mother 22  . Breast cancer Maternal Grandmother        young    Allergies: Ibuprofen Current Outpatient Medications on File Prior to Visit  Medication Sig Dispense Refill  . amLODipine (NORVASC) 5 MG tablet Take 1 tablet (  5 mg total) by mouth daily. 90 tablet 3  . fluticasone (FLONASE) 50 MCG/ACT nasal spray Place 2 sprays into both nostrils daily. 16 g 3  . RABEprazole (ACIPHEX) 20 MG tablet Take 20 mg by mouth daily. Reported on 05/02/2016     No current facility-administered medications on file prior to visit.     Social History   Tobacco Use  . Smoking status: Never Smoker  . Smokeless tobacco: Never Used  Substance Use Topics  . Alcohol use: No  . Drug use: No    Review of Systems  Constitutional: Positive for fatigue. Negative for chills and fever.  HENT: Positive for congestion. Negative for postnasal drip and sinus pain.   Respiratory: Positive for cough. Negative for  shortness of breath and wheezing.   Cardiovascular: Negative for chest pain and palpitations.  Gastrointestinal: Negative for nausea and vomiting.  Musculoskeletal: Positive for arthralgias (knee pain).  Psychiatric/Behavioral: Positive for sleep disturbance. The patient is nervous/anxious.       Objective:    BP 124/72 (BP Location: Left Arm, Patient Position: Sitting, Cuff Size: Large)   Pulse 78   Temp 97.9 F (36.6 C)   Wt 267 lb 6.4 oz (121.3 kg)   SpO2 95%   BMI 45.90 kg/m  BP Readings from Last 3 Encounters:  12/09/18 124/72  11/18/18 118/72  11/16/18 (!) 157/86   Wt Readings from Last 3 Encounters:  12/09/18 267 lb 6.4 oz (121.3 kg)  11/18/18 268 lb (121.6 kg)  11/16/18 264 lb (119.7 kg)    Physical Exam Vitals signs reviewed.  Constitutional:      Appearance: She is well-developed.  Eyes:     Conjunctiva/sclera: Conjunctivae normal.  Cardiovascular:     Rate and Rhythm: Normal rate and regular rhythm.     Pulses: Normal pulses.     Heart sounds: Normal heart sounds.  Pulmonary:     Effort: Pulmonary effort is normal.     Breath sounds: Normal breath sounds. No wheezing, rhonchi or rales.  Skin:    General: Skin is warm and dry.  Neurological:     Mental Status: She is alert.  Psychiatric:        Speech: Speech normal.        Behavior: Behavior normal.        Thought Content: Thought content normal.        Assessment & Plan:   Problem List Items Addressed This Visit      Cardiovascular and Mediastinum   Essential hypertension    At goal, continue regimen        Respiratory   OSA (obstructive sleep apnea)    Currently not on CPAP, referral to pulmonology further evaluation.        Other   Cough    Acute on chronic, no acute respiratory distress.  Failed PPI, Flonase.  Trial of Symbicort, referral to pulmonology further evaluation.      Relevant Medications   budesonide-formoterol (SYMBICORT) 80-4.5 MCG/ACT inhaler   Obesity (BMI  35.0-39.9 without comorbidity) - Primary    Trial Wellbutrin, referral to medical weight loss management.  Education provided on low carbohydrate, sugar diet.  Close follow-up.      Relevant Medications   buPROPion (WELLBUTRIN XL) 150 MG 24 hr tablet   Other Relevant Orders   Hemoglobin A1c   TSH   Lipid panel   VITAMIN D 25 Hydroxy (Vit-D Deficiency, Fractures)   Amb Ref to Medical Weight Management   Fatigue  Suspect multifactorial,  Suspect lack of exercise, obesity, untreated sleep apnea, depression.  pending labs at this time. Close follow-up.      Relevant Orders   Ambulatory referral to Pulmonology   VITAMIN D 25 Hydroxy (Vit-D Deficiency, Fractures)       I have discontinued Joyel C. Lenker's cephALEXin, sulfamethoxazole-trimethoprim, oxyCODONE-acetaminophen, and fluconazole. I am also having her start on buPROPion and budesonide-formoterol. Additionally, I am having her maintain her RABEprazole, amLODipine, and fluticasone.   Meds ordered this encounter  Medications  . buPROPion (WELLBUTRIN XL) 150 MG 24 hr tablet    Sig: Start 150 mg ER PO qam, increase after 3 days to 300 mg qam.    Dispense:  60 tablet    Refill:  3    Order Specific Question:   Supervising Provider    Answer:   Deborra Medina L [[id number]]  . budesonide-formoterol (SYMBICORT) 80-4.5 MCG/ACT inhaler    Sig: Inhale 2 puffs into the lungs 2 (two) times daily.    Dispense:  1 Inhaler    Refill:  3    Order Specific Question:   Supervising Provider    Answer:   Crecencio Mc [[id number]]    Return precautions given.   Risks, benefits, and alternatives of the medications and treatment plan prescribed today were discussed, and patient expressed understanding.   Education regarding symptom management and diagnosis given to patient on AVS.  Continue to follow with Burnard Hawthorne, FNP for routine health maintenance.   [name] and I agreed with plan.   Mable Paris, FNP

## 2018-12-02 ENCOUNTER — Encounter: Payer: Self-pay | Admitting: Family

## 2018-12-02 ENCOUNTER — Telehealth: Payer: Self-pay | Admitting: Family

## 2018-12-02 DIAGNOSIS — G4733 Obstructive sleep apnea (adult) (pediatric): Secondary | ICD-10-CM | POA: Insufficient documentation

## 2018-12-02 NOTE — Telephone Encounter (Signed)
Call pt  Reviewed Dr Clemmie Krill ( prior pcp) notes  We didn't discuss sleep apnea during our ov  Please ask her to make a f/u so we can discuss this , and whether she needs to be testing again

## 2018-12-09 ENCOUNTER — Ambulatory Visit (INDEPENDENT_AMBULATORY_CARE_PROVIDER_SITE_OTHER): Payer: Medicare HMO | Admitting: Family

## 2018-12-09 ENCOUNTER — Encounter: Payer: Self-pay | Admitting: Family

## 2018-12-09 VITALS — BP 124/72 | HR 78 | Temp 97.9°F | Wt 267.4 lb

## 2018-12-09 DIAGNOSIS — G4733 Obstructive sleep apnea (adult) (pediatric): Secondary | ICD-10-CM | POA: Diagnosis not present

## 2018-12-09 DIAGNOSIS — E669 Obesity, unspecified: Secondary | ICD-10-CM

## 2018-12-09 DIAGNOSIS — I10 Essential (primary) hypertension: Secondary | ICD-10-CM | POA: Diagnosis not present

## 2018-12-09 DIAGNOSIS — R5383 Other fatigue: Secondary | ICD-10-CM

## 2018-12-09 DIAGNOSIS — R05 Cough: Secondary | ICD-10-CM | POA: Diagnosis not present

## 2018-12-09 DIAGNOSIS — R059 Cough, unspecified: Secondary | ICD-10-CM

## 2018-12-09 MED ORDER — BUDESONIDE-FORMOTEROL FUMARATE 80-4.5 MCG/ACT IN AERO: 2.0000 | INHALATION_SPRAY | Freq: Two times a day (BID) | RESPIRATORY_TRACT | 3 refills | Status: DC

## 2018-12-09 MED ORDER — BUPROPION HCL ER (XL) 150 MG PO TB24: ORAL_TABLET | ORAL | 3 refills | Status: DC

## 2018-12-09 NOTE — Patient Instructions (Addendum)
Trial of Symbicort for cough.  We have also placed a referral to pulmonology.  May start Wellbutrin, please follow directions in terms of increasing.    Today we discussed referrals, orders.  Pulmonology, medical weight loss management    I have placed these orders in the system for you.  Please be sure to give [place] a call if you have not heard from our office regarding this. We should hear from [place] within ONE week with information regarding your appointment. If not, please let me know immediately.    This is  Dr. Lupita Dawn  example of a  "Low GI"  Diet:  It will allow you to lose 4 to 8  lbs  per month if you follow it carefully.  Your goal with exercise is a minimum of 30 minutes of aerobic exercise 5 days per week (Walking does not count once it becomes easy!)    All of the foods can be found at grocery stores and in bulk at [employer].  The Atkins protein bars and shakes are available in more varieties at Target, [employer] and [employer].     7 AM Breakfast:  Choose from the following:  Low carbohydrate Protein  Shakes (I recommend the  Premier Protein chocolate shakes,  EAS AdvantEdge "Carb Control" shakes  Or the Atkins shakes all are under 3 net carbs)     a scrambled egg/bacon/cheese burrito made with Mission's "carb balance" whole wheat tortilla  (about 10 net carbs )  Regulatory affairs officer (basically a quiche without the pastry crust) that is eaten cold and very convenient way to get your eggs.  8 carbs)  If you make your own protein shakes, avoid bananas and pineapple,  And use low carb greek yogurt or original /unsweetened almond or soy milk    Avoid cereal and bananas, oatmeal and cream of wheat and grits. They are loaded with carbohydrates!   10 AM: high protein snack:  Protein bar by Atkins (the snack size, under 200 cal, usually < 6 net carbs).    A stick of cheese:  Around 1 carb,  100 cal     Dannon Light n Fit [place] Yogurt  (80 cal, 8 carbs)  Other so  called "protein bars" and Greek yogurts tend to be loaded with carbohydrates.  Remember, in food advertising, the word "energy" is synonymous for " carbohydrate."  Lunch:   A Sandwich using the bread choices listed, Can use any  Eggs,  lunchmeat, grilled meat or canned tuna), avocado, regular mayo/mustard  and cheese.  A Salad using blue cheese, ranch,  Goddess or vinagrette,  Avoid taco shells, croutons or "confetti" and no "candied nuts" but regular nuts OK.   No pretzels, nabs  or chips.  Pickles and miniature sweet peppers are a good low carb alternative that provide a "crunch"  The bread is the only source of carbohydrate in a sandwich and  can be decreased by trying some of the attached alternatives to traditional loaf bread   Avoid "Low fat dressings, as well as [place] dressings They are loaded with sugar!   3 PM/ Mid day  Snack:  Consider  1 ounce of  almonds, walnuts, pistachios, pecans, peanuts,  Macadamia nuts or a nut medley.  Avoid "granola and granola bars "  Mixed nuts are ok in moderation as long as there are no raisins,  cranberries or dried fruit.   KIND bars are OK if you get the  low glycemic index variety   Try the prosciutto/mozzarella cheese sticks by [employer]  In deli /backery section   High protein      6 PM  Dinner:     Meat/fowl/fish with a green salad, and either broccoli, cauliflower, green beans, spinach, brussel sprouts or  Lima beans. DO NOT BREAD THE PROTEIN!!      There is a low carb pasta by Dreamfield's that is acceptable and tastes great: only 5 digestible carbs/serving.( All grocery stores but BJs carry it ) Several ready made meals are available low carb:   Try Michel Angelo's chicken piccata or chicken or eggplant parm over low carb pasta.(Lowes and BJs)   Marjory Lies Sanchez's "Carnitas" (pulled pork, no sauce,  0 carbs) or his beef pot roast to make a dinner burrito (at [employer]J's)  Pesto over low carb pasta (bj's sells a good quality pesto  in the center refrigerated section of the deli   Try satueeing  Cheral Marker with mushroooms as a good side   [employer] Giant makes a mashed cauliflower that tastes like mashed potatoes  Whole wheat pasta is still full of digestible carbs and  Not as low in glycemic index as Dreamfield's.   Brown rice is still rice,  So skip the rice and noodles if you eat [place] or [place] (or at least limit to 1/2 cup)  9 PM snack :   Breyer's "low carb" fudgsicle or  ice cream bar (Carb Smart line), or  Weight Watcher's ice cream bar , or another "no sugar added" ice cream;  a serving of fresh berries/cherries with whipped cream   Cheese or DANNON'S LlGHT N FIT GREEK YOGURT  8 ounces of [place] unsweetened almond/cococunut milk    Treat yourself to a parfait made with whipped cream blueberiies, walnuts and vanilla greek yogurt  Avoid bananas, pineapple, grapes  and watermelon on a regular basis because they are high in sugar.  THINK OF THEM AS DESSERT  Remember that snack Substitutions should be less than 10 NET carbs per serving and meals < 20 carbs. Remember to subtract fiber grams to get the "net carbs."  @TULLOBREADPACKAGE @

## 2018-12-09 NOTE — Assessment & Plan Note (Signed)
Currently not on CPAP, referral to pulmonology further evaluation.

## 2018-12-09 NOTE — Assessment & Plan Note (Signed)
Suspect multifactorial,  Suspect lack of exercise, obesity, untreated sleep apnea, depression.  pending labs at this time. Close follow-up.

## 2018-12-09 NOTE — Assessment & Plan Note (Signed)
Acute on chronic, no acute respiratory distress.  Failed PPI, Flonase.  Trial of Symbicort, referral to pulmonology further evaluation.

## 2018-12-09 NOTE — Assessment & Plan Note (Addendum)
Trial Wellbutrin, referral to medical weight loss management.  Education provided on low carbohydrate, sugar diet.  Close follow-up.

## 2018-12-09 NOTE — Assessment & Plan Note (Signed)
At goal, continue regimen. 

## 2018-12-10 LAB — LIPID PANEL
Chol/HDL Ratio: 3.6 ratio (ref 0.0–4.4)
Cholesterol, Total: 163 mg/dL (ref 100–199)
HDL: 45 mg/dL (ref >39)
LDL Calculated: 100 mg/dL — ABNORMAL HIGH (ref 0–99)
Triglycerides: 88 mg/dL (ref 0–149)
VLDL Cholesterol Cal: 18 mg/dL (ref 5–40)

## 2018-12-10 LAB — HEMOGLOBIN A1C
Est. average glucose Bld gHb Est-mCnc: 114 mg/dL
Hgb A1c MFr Bld: 5.6 % (ref 4.8–5.6)

## 2018-12-10 LAB — VITAMIN D 25 HYDROXY (VIT D DEFICIENCY, FRACTURES): Vit D, 25-Hydroxy: 13.8 ng/mL — ABNORMAL LOW (ref 30.0–100.0)

## 2018-12-10 LAB — TSH: TSH: 0.593 u[IU]/mL (ref 0.450–4.500)

## 2018-12-11 ENCOUNTER — Ambulatory Visit: Payer: Self-pay | Admitting: *Deleted

## 2018-12-11 ENCOUNTER — Encounter: Payer: Self-pay | Admitting: *Deleted

## 2018-12-11 DIAGNOSIS — R059 Cough, unspecified: Secondary | ICD-10-CM

## 2018-12-11 DIAGNOSIS — R05 Cough: Secondary | ICD-10-CM

## 2018-12-11 MED ORDER — BENZONATATE 100 MG PO CAPS: 100.0000 mg | ORAL_CAPSULE | Freq: Two times a day (BID) | ORAL | 0 refills | Status: DC | PRN

## 2018-12-11 MED ORDER — PREDNISONE 10 MG PO TABS: ORAL_TABLET | ORAL | 0 refills | Status: DC

## 2018-12-11 NOTE — Telephone Encounter (Signed)
Patient does have appointment with Pulmonology 12/22/18, but does not think they will be able to help or that's the issue. She also will try the tessalon pearls, but have not worked in the past. Patient declines prednisone.

## 2018-12-11 NOTE — Telephone Encounter (Signed)
Message from Rayann Heman sent at 12/11/2018 11:02 AM EST   Summary: cough/medication not working    budesonide-formoterol [facility]) 80-4.5 MCG/ACT inhaler [[id number]] Pt called and stated that this medication didn't help with the cough. Pt would like a call back from the nurse on what she can do. Please advise          Pt seen for OV with Margaret,NP on 12/09/17. Per office visit notes pt was on a trial of  Symbicort for cough. Pt returning call stating that the medication did not help with cough and wants recommendations on what to do to help with cough.  Reason for Disposition . [1] Follow-up call from patient regarding patient's clinical status AND [2] information urgent  Answer Assessment - Initial Assessment Questions 1. REASON FOR CALL or QUESTION: "What is your reason for calling today?" or "How can I best help you?" or "What question do you have that I can help answer?"     Pt states she has a cough that has not improved with the use of Symbicort 2. CALLER: Document the source of call. (e.g., laboratory, patient).     patient  Protocols used: PCP CALL - NO TRIAGE-A-AH

## 2018-12-11 NOTE — Telephone Encounter (Signed)
Call patient Please ensure she does not feel she needs to be reevaluated.  Would advise her to give the Symbicort a little bit more time.  Please ensure she is using the Symbicort 2 puffs twice a day. If she is able to take prednisone, we could do a trial of prednisone 4 days worth.  She would need to start this tomorrow as prednisone can interfere with sleep.  It can be stimulating so please advise getting all doses in prior 2 PM. I will leave a print a prescription with you; if she would like, you may fax on her behalf I also will give her some Tessalon Perles.  Otherwise, I think it is most prudent to await consult with pulmonology.

## 2018-12-11 NOTE — Addendum Note (Signed)
Addended by: Burnard Hawthorne on: 12/11/2018 02:44 PM   Modules accepted: Orders

## 2018-12-22 ENCOUNTER — Encounter: Payer: Self-pay | Admitting: Internal Medicine

## 2018-12-22 ENCOUNTER — Ambulatory Visit: Payer: Medicare HMO | Admitting: Internal Medicine

## 2018-12-22 VITALS — BP 138/84 | HR 63 | Ht 64.0 in | Wt 261.8 lb

## 2018-12-22 DIAGNOSIS — G4719 Other hypersomnia: Secondary | ICD-10-CM

## 2018-12-22 NOTE — Patient Instructions (Signed)

## 2018-12-22 NOTE — Progress Notes (Signed)
[facility] Consultation      Assessment and Plan:  Excessive daytime sleepiness. Morning headaches. - Symptoms and signs of obstructive sleep apnea. - We will send for sleep study.  Start on CPAP as indicated.  Essential hypertension, obesity. - Sleep apnea can contribute to above conditions, therefore treatment of sleep apnea is an important part of their management.  Orders Placed This Encounter  Procedures  . Home sleep test   Return in about 3 months (around 03/24/2019).    Date: 12/22/2018  MRN# [id number] Jacqueline Delgado 1951-05-04   Jacqueline Delgado is a 68 y.o. old female seen in consultation for chief complaint of:    Chief Complaint  Patient presents with  . Consult    Consult for Sleep referred by Burnard Hawthorne, FNP. States she was dx with sleep apnea years ago. She previously worse a cpap but no longer does. She states she wakes up every hour. States she snores very loud.     HPI:   She has a diagnosed with OSA more than 10 years ago, she was put on CPAP, she eventually stopped using it and then lost it, she has been off of it for many years. She comes back in today after she told her doctor that she has not been sleeping well, she has been continuing to gain weight. She wakes several times per night, about 1-2 hours.  She is tired during the day, she does not nap. She has headaches in the am.  She has had a few episodes of sleep paralysis, no cataplexy, denies jaw pain, no TMJ.     PMHX:   Past Medical History:  Diagnosis Date  . Anginal pain ([place])     3/8-12/21/10  . Arthritis    Osteoarthritis in BLE knee  . Colon polyp   . GERD (gastroesophageal reflux disease)   . Hiatal hernia   . History of hiatal hernia   . Hypertension    controlled  . Obesity   . Reflux   . Sleep apnea    NO CPAP  . Small vessel disease ([place])    Surgical Hx:  Past Surgical History:  Procedure Laterality Date  . ABDOMINAL HYSTERECTOMY     partial    . CHOLECYSTECTOMY    . COLONOSCOPY W/ POLYPECTOMY     adenomatous colon polyp  . COLONOSCOPY WITH PROPOFOL N/A 12/10/2017   Procedure: COLONOSCOPY WITH PROPOFOL;  Surgeon: Manya Silvas, MD;  Location: [facility] ENDOSCOPY;  Service: Endoscopy;  Laterality: N/A;  . ESOPHAGOGASTRODUODENOSCOPY (EGD) WITH PROPOFOL N/A 10/11/2015   Procedure: ESOPHAGOGASTRODUODENOSCOPY (EGD) WITH PROPOFOL;  Surgeon: Manya Silvas, MD;  Location: [facility] ENDOSCOPY;  Service: Endoscopy;  Laterality: N/A;  . ESOPHAGOGASTRODUODENOSCOPY (EGD) WITH PROPOFOL N/A 12/10/2017   Procedure: ESOPHAGOGASTRODUODENOSCOPY (EGD) WITH PROPOFOL;  Surgeon: Manya Silvas, MD;  Location: [facility] ENDOSCOPY;  Service: Endoscopy;  Laterality: N/A;  . JOINT REPLACEMENT Right    knee, Oct 2012  . JOINT REPLACEMENT     hopefully getting left partial knee replacement in July 2016  . SAVORY DILATION N/A 10/11/2015   Procedure: SAVORY DILATION;  Surgeon: Manya Silvas, MD;  Location: [facility] ENDOSCOPY;  Service: Endoscopy;  Laterality: N/A;  . SHOULDER ARTHROSCOPY WITH ROTATOR CUFF REPAIR AND SUBACROMIAL DECOMPRESSION Right 06/14/2015   Procedure: SHOULDER ARTHROSCOPY WITH mini open ROTATOR CUFF REPAIR AND SUBACROMIAL DECOMPRESSION, release long head biceps tendon.;  Surgeon: Leanor Kail, MD;  Location: [facility] ORS;  Service: Orthopedics;  Laterality: Right;  Family Hx:  Family History  Problem Relation Age of Onset  . Alcoholism Other        brother  . Arthritis Other        parent  . Breast cancer Other        mother and grandmother  . Hyperlipidemia Father   . Heart disease Father   . Stroke Father   . Hypertension Father   . Diabetes Other        parent and other family member  . Breast cancer Mother 23  . Breast cancer Maternal Grandmother        young   Social Hx:   Social History   Tobacco Use  . Smoking status: Never Smoker  . Smokeless tobacco: Never Used  Substance Use Topics  . Alcohol use: No  . Drug use: No    Medication:    Current Outpatient Medications:  .  budesonide-formoterol (SYMBICORT) 80-4.5 MCG/ACT inhaler, Inhale 2 puffs into the lungs 2 (two) times daily., Disp: 1 Inhaler, Rfl: 3 .  buPROPion (WELLBUTRIN XL) 150 MG 24 hr tablet, Start 150 mg ER PO qam, increase after 3 days to 300 mg qam., Disp: 60 tablet, Rfl: 3 .  fluticasone (FLONASE) 50 MCG/ACT nasal spray, Place 2 sprays into both nostrils daily., Disp: 16 g, Rfl: 3 .  predniSONE (DELTASONE) 10 MG tablet, Take 40 mg by mouth on day 1, then taper 10 mg daily until gone, Disp: 10 tablet, Rfl: 0 .  RABEprazole (ACIPHEX) 20 MG tablet, Take 20 mg by mouth daily. Reported on 05/02/2016, Disp: , Rfl:    Allergies:  Ibuprofen  Review of Systems: Gen:  Denies  fever, sweats, chills HEENT: Denies blurred vision, double vision. bleeds, sore throat Cvc:  No dizziness, chest pain. Resp:   Denies cough or sputum production, shortness of breath Gi: Denies swallowing difficulty, stomach pain. Gu:  Denies bladder incontinence, burning urine Ext:   No Joint pain, stiffness. Skin: No skin rash,  hives  Endoc:  No polyuria, polydipsia. Psych: No depression, insomnia. Other:  All other systems were reviewed with the patient and were negative other that what is mentioned in the HPI.   Physical Examination:   VS: BP 138/84   Pulse 63   Ht 5\' 4"  (1.626 m)   Wt 261 lb 12.8 oz (118.8 kg)   SpO2 100%   BMI 44.94 kg/m   General Appearance: No distress  Neuro:without focal findings,  speech normal,  HEENT: PERRLA, EOM intact.  Mallampati 3. Pulmonary: normal breath sounds, No wheezing.  CardiovascularNormal S1,S2.  No m/r/g.   Abdomen: Benign, Soft, non-tender. Renal:  No costovertebral tenderness  GU:  No performed at this time. Endoc: No evident thyromegaly, no signs of acromegaly. Skin:   warm, no rashes, no ecchymosis  Extremities: normal, no cyanosis, clubbing.  Other findings:    LABORATORY PANEL:   CBC No results for  input(s): WBC, HGB, HCT, PLT in the last 168 hours. ------------------------------------------------------------------------------------------------------------------  Chemistries  No results for input(s): NA, K, CL, CO2, GLUCOSE, BUN, CREATININE, CALCIUM, MG, AST, ALT, ALKPHOS, BILITOT in the last 168 hours.  Invalid input(s): GFRCGP ------------------------------------------------------------------------------------------------------------------  Cardiac Enzymes No results for input(s): TROPONINI in the last 168 hours. ------------------------------------------------------------  RADIOLOGY:  No results found.     Thank  you for the consultation and for allowing [facility] to assist in the care of your patient. Our recommendations are noted above.  Please contact us if we can be  of further service.   Marda Stalker, M.D., F.C.C.P.  Board Certified in Internal Medicine, Pulmonary Medicine, [place], and Sleep Medicine.  [facility] Office Number: [phone]   12/22/2018

## 2018-12-23 NOTE — Telephone Encounter (Signed)
LMTCB to inquire if patient would like appointment. PEC may speak with patient, advise & schedule if needed.

## 2018-12-23 NOTE — Telephone Encounter (Signed)
Call patient I got notes that she was seen by pulmonology and is pursuing sleep study. Please offer a follow appointment with [place].  Or she may wait until after sleep study

## 2019-02-04 ENCOUNTER — Telehealth: Payer: Self-pay | Admitting: Family

## 2019-02-04 NOTE — Telephone Encounter (Signed)
Copied from [place] [phone]. Topic: Quick Communication - Rx Refill/Question >> Feb 04, 2019 12:49 PM Leward Quan A wrote: Medication: RABEprazole (ACIPHEX) 20 MG tablet,   Has the patient contacted their pharmacy? Yes.   (Agent: If no, request that the patient contact the pharmacy for the refill.) (Agent: If yes, when and what did the pharmacy advise?)  Preferred Pharmacy (with phone number or street name): [id number] [address], [place] [phone] (Phone) [phone] (Fax)    Agent: Please be advised that RX refills may take up to 3 business days. We ask that you follow-up with your pharmacy.

## 2019-02-05 ENCOUNTER — Other Ambulatory Visit: Payer: Self-pay

## 2019-02-05 MED ORDER — RABEPRAZOLE SODIUM 20 MG PO TBEC: 20.0000 mg | DELAYED_RELEASE_TABLET | Freq: Every day | ORAL | 1 refills | Status: DC

## 2019-02-05 NOTE — Telephone Encounter (Signed)
I have sent to patient;'s pharmacy.  

## 2019-02-08 ENCOUNTER — Telehealth: Payer: Self-pay | Admitting: Family

## 2019-02-08 NOTE — Telephone Encounter (Signed)
Copied from [place] [phone]. Topic: Quick Communication - Rx Refill/Question >> Feb 08, 2019  5:12 PM Blase Mess A wrote: Medication: RABEprazole (ACIPHEX) 20 MG tablet [[id number]] Patient is calling because she can not take the generic of this medication. The pharmacy wants to give her a generic. Can this be resent to the pharmacy please? The script must state non generic Has the patient contacted their pharmacy? Yes  (Agent: If no, request that the patient contact the pharmacy for the refill.) (Agent: If yes, when and what did the pharmacy advise?)  Preferred Pharmacy (with phone number or street name):   Agent: Please be advised that RX refills may take up to 3 business days. We ask that you follow-up with your pharmacy.

## 2019-02-08 NOTE — Telephone Encounter (Signed)
Copied from [place] [id number]. Topic: Quick Communication - Rx Refill/Question >> Feb 08, 2019  5:16 PM Blase Mess A wrote: Medication: buPROPion (WELLBUTRIN XL) 150 MG 24 hr tablet [[id number]] The pharmacy is stating the script is for 30 tablets instead of 60. Can this be resent.  Has the patient contacted their pharmacy? Yes  (Agent: If no, request that the patient contact the pharmacy for the refill.) (Agent: If yes, when and what did the pharmacy advise?)  Preferred Pharmacy (with phone number or street name):  Agent: Please be advised that RX refills may take up to 3 business days. We ask that you follow-up with your pharmacy.

## 2019-02-09 ENCOUNTER — Ambulatory Visit: Payer: Medicare HMO

## 2019-02-10 ENCOUNTER — Other Ambulatory Visit: Payer: Self-pay

## 2019-02-10 DIAGNOSIS — E669 Obesity, unspecified: Secondary | ICD-10-CM

## 2019-02-10 MED ORDER — RABEPRAZOLE SODIUM 20 MG PO TBEC: 20.0000 mg | DELAYED_RELEASE_TABLET | Freq: Every day | ORAL | 3 refills | Status: DC

## 2019-02-10 MED ORDER — BUPROPION HCL ER (XL) 150 MG PO TB24: ORAL_TABLET | ORAL | 3 refills | Status: DC

## 2019-02-10 MED ORDER — RABEPRAZOLE SODIUM 20 MG PO TBEC: 20.0000 mg | DELAYED_RELEASE_TABLET | Freq: Every day | ORAL | 1 refills | Status: DC

## 2019-02-10 NOTE — Telephone Encounter (Signed)
I have corrected prescription & resent. I called & advised patient of this.

## 2019-02-18 NOTE — Telephone Encounter (Signed)
Pt is calling and her insurance is only covering one tablet a day. Pt needs PA for wellbutrin

## 2019-02-19 ENCOUNTER — Telehealth: Payer: Self-pay

## 2019-02-19 MED ORDER — BUPROPION HCL ER (XL) 300 MG PO TB24: 300.0000 mg | ORAL_TABLET | Freq: Every day | ORAL | 3 refills | Status: DC

## 2019-02-19 NOTE — Telephone Encounter (Signed)
I called & let patient know that her prescription should be ok to pick up. We had initially wrote a dose to titrate up & we had to correct the way it was written so PA wasn't needed. [facility] also [facility] to make sure [facility] this.

## 2019-02-24 NOTE — Telephone Encounter (Signed)
Patient calling because the script for buPROPion (WELLBUTRIN XL) 300 MG 24 hr tablet was write to take 1 tablet per day, and it's supposed to be 2 tablets per day. She says it also cost $145 odd dollars for the 90 days supply which is unaffordable. Please call patient to discuss.

## 2019-02-25 NOTE — Telephone Encounter (Signed)
[facility] and they stated that they would put the RX down and give the patient 30 tablets today instead of the 90 days the pt states she cannot afford the 90 day supply.   Walt Geathers,cma

## 2019-03-05 DIAGNOSIS — G8929 Other chronic pain: Secondary | ICD-10-CM | POA: Diagnosis not present

## 2019-03-05 DIAGNOSIS — Z96651 Presence of right artificial knee joint: Secondary | ICD-10-CM | POA: Diagnosis not present

## 2019-03-05 DIAGNOSIS — M25562 Pain in left knee: Secondary | ICD-10-CM | POA: Diagnosis not present

## 2019-03-05 DIAGNOSIS — Z6841 Body Mass Index (BMI) 40.0 and over, adult: Secondary | ICD-10-CM | POA: Diagnosis not present

## 2019-03-05 DIAGNOSIS — M1712 Unilateral primary osteoarthritis, left knee: Secondary | ICD-10-CM | POA: Diagnosis not present

## 2019-03-10 ENCOUNTER — Ambulatory Visit: Payer: Self-pay | Admitting: Family

## 2019-03-12 DIAGNOSIS — R69 Illness, unspecified: Secondary | ICD-10-CM | POA: Diagnosis not present

## 2019-03-23 DIAGNOSIS — R69 Illness, unspecified: Secondary | ICD-10-CM | POA: Diagnosis not present

## 2019-04-01 DIAGNOSIS — R69 Illness, unspecified: Secondary | ICD-10-CM | POA: Diagnosis not present

## 2019-04-12 ENCOUNTER — Emergency Department
Admission: EM | Admit: 2019-04-12 | Discharge: 2019-04-13 | Disposition: A | Discharge status: Home / Self Care | Payer: Medicare HMO | Attending: Student in an Organized Health Care Education/Training Program | Admitting: Student in an Organized Health Care Education/Training Program

## 2019-04-12 ENCOUNTER — Emergency Department: Payer: Medicare HMO

## 2019-04-12 ENCOUNTER — Other Ambulatory Visit: Payer: Self-pay

## 2019-04-12 DIAGNOSIS — R42 Dizziness and giddiness: Secondary | ICD-10-CM | POA: Diagnosis not present

## 2019-04-12 DIAGNOSIS — R0602 Shortness of breath: Secondary | ICD-10-CM | POA: Insufficient documentation

## 2019-04-12 DIAGNOSIS — Z79899 Other long term (current) drug therapy: Secondary | ICD-10-CM | POA: Insufficient documentation

## 2019-04-12 DIAGNOSIS — R079 Chest pain, unspecified: Secondary | ICD-10-CM | POA: Insufficient documentation

## 2019-04-12 DIAGNOSIS — H81399 Other peripheral vertigo, unspecified ear: Secondary | ICD-10-CM

## 2019-04-12 DIAGNOSIS — I10 Essential (primary) hypertension: Secondary | ICD-10-CM | POA: Diagnosis not present

## 2019-04-12 LAB — BASIC METABOLIC PANEL
Anion gap: 8 (ref 5–15)
BUN: 17 mg/dL (ref 8–23)
CO2: 25 mmol/L (ref 22–32)
Calcium: 9.1 mg/dL (ref 8.9–10.3)
Chloride: 109 mmol/L (ref 98–111)
Creatinine, Ser: 0.92 mg/dL (ref 0.44–1.00)
GFR calc Af Amer: >60 mL/min (ref >60)
GFR calc non Af Amer: >60 mL/min (ref >60)
Glucose, Bld: 80 mg/dL (ref 70–99)
Potassium: 3.8 mmol/L (ref 3.5–5.1)
Sodium: 142 mmol/L (ref 135–145)

## 2019-04-12 LAB — CBC
HCT: 35 % — ABNORMAL LOW (ref 36.0–46.0)
Hemoglobin: 10.8 g/dL — ABNORMAL LOW (ref 12.0–15.0)
MCH: 25.7 pg — ABNORMAL LOW (ref 26.0–34.0)
MCHC: 30.9 g/dL (ref 30.0–36.0)
MCV: 83.3 fL (ref 80.0–100.0)
Platelets: 226 10*3/uL (ref 150–400)
RBC: 4.2 MIL/uL (ref 3.87–5.11)
RDW: 15.1 % (ref 11.5–15.5)
WBC: 5.8 10*3/uL (ref 4.0–10.5)
nRBC: 0 % (ref 0.0–0.2)

## 2019-04-12 LAB — TROPONIN I (HIGH SENSITIVITY)
Troponin I (High Sensitivity): 3 ng/L (ref <18)
Troponin I (High Sensitivity): 3 ng/L (ref <18)

## 2019-04-12 MED ORDER — MECLIZINE HCL 25 MG PO TABS
12.5000 mg | ORAL_TABLET | Freq: Once | ORAL | Status: AC
  Administered 2019-04-12: 12.5 mg via ORAL
  Filled 2019-04-12: qty 1

## 2019-04-12 MED ORDER — SODIUM CHLORIDE 0.9% FLUSH: 3.0000 mL | Freq: Once | INTRAVENOUS | Status: DC

## 2019-04-12 NOTE — ED Notes (Signed)
Reports dizziness x 3day worse today dizziness came and went all day, with feeling of floor moving and room spinning. Also reports ringing in the right ear.

## 2019-04-12 NOTE — ED Notes (Signed)
Patient transported to CT 

## 2019-04-12 NOTE — ED Triage Notes (Addendum)
Pt arrives to ED via POV from home with c/o dizziness, nausea, centralized non-radiating CP and SHOB x 2 1/2 hrs. Pt denies emesis or diarrhea; no fever. Pt denies previous cardiac h/x. Pt is A&O, in NAD; RR even, regular, and unlabored.

## 2019-04-12 NOTE — ED Provider Notes (Signed)
[facility] Emergency Department Provider Note    First MD Initiated Contact with Patient 04/12/19 2202     (approximate)  I have reviewed the triage vital signs and the nursing notes.   HISTORY  Chief Complaint Nausea and Dizziness    HPI Jacqueline Delgado is a 67 y.o. female below listed past medical history presents the ER with chief complaint of dizziness.  Symptoms occurred this afternoon at home.  States she was Caring for her grandchild and was leaning forward when she stood up quickly started feeling that the room was spinning around.  Was abrupt in onset.  Denies any associated numbness or tingling.  States that the symptoms were worsened by movement and looking about.  States that symptoms lasted roughly an hour and were improved after she was able to close her eyes and lay down and rest.  Since she came to the ER because during the episode she started having chest discomfort and pressure.  States that she felt like she was having a panic attack.  Did not take anything to improve symptoms.  Denies any recent fevers.  No new medication changes.  No history of stroke.    Past Medical History:  Diagnosis Date   Anginal pain ([place])     3/8-12/21/10   Arthritis    Osteoarthritis in BLE knee   Colon polyp    GERD (gastroesophageal reflux disease)    Hiatal hernia    History of hiatal hernia    Hypertension    controlled   Obesity    Reflux    Sleep apnea    NO CPAP   Small vessel disease ([place])    Family History  Problem Relation Age of Onset   Alcoholism Other        brother   Arthritis Other        parent   Breast cancer Other        mother and grandmother   Hyperlipidemia Father    Heart disease Father    Stroke Father    Hypertension Father    Diabetes Other        parent and other family member   Breast cancer Mother 59   Breast cancer Maternal Grandmother        young   Past Surgical History:  Procedure  Laterality Date   ABDOMINAL HYSTERECTOMY     partial   CHOLECYSTECTOMY     COLONOSCOPY W/ POLYPECTOMY     adenomatous colon polyp   COLONOSCOPY WITH PROPOFOL N/A 12/10/2017   Procedure: COLONOSCOPY WITH PROPOFOL;  Surgeon: Manya Silvas, MD;  Location: [facility] ENDOSCOPY;  Service: Endoscopy;  Laterality: N/A;   ESOPHAGOGASTRODUODENOSCOPY (EGD) WITH PROPOFOL N/A 10/11/2015   Procedure: ESOPHAGOGASTRODUODENOSCOPY (EGD) WITH PROPOFOL;  Surgeon: Manya Silvas, MD;  Location: [facility] ENDOSCOPY;  Service: Endoscopy;  Laterality: N/A;   ESOPHAGOGASTRODUODENOSCOPY (EGD) WITH PROPOFOL N/A 12/10/2017   Procedure: ESOPHAGOGASTRODUODENOSCOPY (EGD) WITH PROPOFOL;  Surgeon: Manya Silvas, MD;  Location: [facility] ENDOSCOPY;  Service: Endoscopy;  Laterality: N/A;   JOINT REPLACEMENT Right    knee, Oct 2012   JOINT REPLACEMENT     hopefully getting left partial knee replacement in July 2016   SAVORY DILATION N/A 10/11/2015   Procedure: SAVORY DILATION;  Surgeon: Manya Silvas, MD;  Location: [facility] ENDOSCOPY;  Service: Endoscopy;  Laterality: N/A;   SHOULDER ARTHROSCOPY WITH ROTATOR CUFF REPAIR AND SUBACROMIAL DECOMPRESSION Right 06/14/2015   Procedure: SHOULDER ARTHROSCOPY WITH mini open ROTATOR CUFF REPAIR  AND SUBACROMIAL DECOMPRESSION, release long head biceps tendon.;  Surgeon: Leanor Kail, MD;  Location: [facility] ORS;  Service: Orthopedics;  Laterality: Right;   Patient Active Problem List   Diagnosis Date Noted   Obesity (BMI 35.0-39.9 without comorbidity) 12/09/2018   Fatigue 12/09/2018   OSA (obstructive sleep apnea) 12/02/2018   Localized swelling of finger of left hand 11/18/2018   Cough 11/11/2018   Genital herpes simplex 11/11/2018   Essential hypertension 11/11/2018   GERD (gastroesophageal reflux disease) 11/11/2018   Plantar fasciitis 05/03/2016   Depression, major, recurrent, moderate ([place]) 07/06/2015      Prior to Admission medications   Medication Sig Start  Date End Date Taking? Authorizing Provider  budesonide-formoterol (SYMBICORT) 80-4.5 MCG/ACT inhaler Inhale 2 puffs into the lungs 2 (two) times daily. 12/09/18   Burnard Hawthorne, FNP  buPROPion (WELLBUTRIN XL) 300 MG 24 hr tablet Take 1 tablet (300 mg total) by mouth daily. 02/19/19   Burnard Hawthorne, FNP  fluticasone (FLONASE) 50 MCG/ACT nasal spray Place 2 sprays into both nostrils daily. 11/11/18   Burnard Hawthorne, FNP  predniSONE (DELTASONE) 10 MG tablet Take 40 mg by mouth on day 1, then taper 10 mg daily until gone 12/11/18   Burnard Hawthorne, FNP  RABEprazole (ACIPHEX) 20 MG tablet Take 1 tablet (20 mg total) by mouth daily. Take 1 tablet twice a day. 02/10/19   Burnard Hawthorne, FNP    Allergies Ibuprofen    Social History Social History   Tobacco Use   Smoking status: Never Smoker   Smokeless tobacco: Never Used  Substance Use Topics   Alcohol use: No   Drug use: No    Review of Systems Patient denies headaches, rhinorrhea, blurry vision, numbness, shortness of breath, chest pain, edema, cough, abdominal pain, nausea, vomiting, diarrhea, dysuria, fevers, rashes or hallucinations unless otherwise stated above in HPI. ____________________________________________   PHYSICAL EXAM:  VITAL SIGNS: Vitals:   04/12/19 1947  BP: 140/89  Pulse: 77  Resp: 17  Temp: 98.9 F (37.2 C)  SpO2: 95%    Constitutional: Alert and oriented.  Eyes: Conjunctivae are normal.  Head: Atraumatic. Nose: No congestion/rhinnorhea. Mouth/Throat: Mucous membranes are moist.   Neck: No stridor. Painless ROM.  Cardiovascular: Normal rate, regular rhythm. Grossly normal heart sounds.  Good peripheral circulation. Respiratory: Normal respiratory effort.  No retractions. Lungs CTAB. Gastrointestinal: Soft and nontender. No distention. No abdominal bruits. No CVA tenderness. Genitourinary: deferred Musculoskeletal: No lower extremity tenderness nor edema.  No joint  effusions. Neurologic:  CN- intact.  No facial droop, Normal FNF.  Normal heel to shin.  Sensation intact bilaterally. Normal speech and language. No gross focal neurologic deficits are appreciated. No gait instability.  HINTs exam is benign Skin:  Skin is warm, dry and intact. No rash noted. Psychiatric: Mood and affect are normal. Speech and behavior are normal.  ____________________________________________   LABS (all labs ordered are listed, but only abnormal results are displayed)  Results for orders placed or performed during the hospital encounter of 04/12/19 (from the past 24 hour(s))  Basic metabolic panel     Status: None   Collection Time: 04/12/19  7:52 PM  Result Value Ref Range   Sodium 142 135 - 145 mmol/L   Potassium 3.8 3.5 - 5.1 mmol/L   Chloride 109 98 - 111 mmol/L   CO2 25 22 - 32 mmol/L   Glucose, Bld 80 70 - 99 mg/dL   BUN 17 8 - 23 mg/dL  Creatinine, Ser 0.92 0.44 - 1.00 mg/dL   Calcium 9.1 8.9 - 10.3 mg/dL   GFR calc non Af Amer >60 >60 mL/min   GFR calc Af Amer >60 >60 mL/min   Anion gap 8 5 - 15  CBC     Status: Abnormal   Collection Time: 04/12/19  7:52 PM  Result Value Ref Range   WBC 5.8 4.0 - 10.5 K/uL   RBC 4.20 3.87 - 5.11 MIL/uL   Hemoglobin 10.8 (L) 12.0 - 15.0 g/dL   HCT 35.0 (L) 36.0 - 46.0 %   MCV 83.3 80.0 - 100.0 fL   MCH 25.7 (L) 26.0 - 34.0 pg   MCHC 30.9 30.0 - 36.0 g/dL   RDW 15.1 11.5 - 15.5 %   Platelets 226 150 - 400 K/uL   nRBC 0.0 0.0 - 0.2 %  Troponin I (High Sensitivity)     Status: None   Collection Time: 04/12/19  7:52 PM  Result Value Ref Range   Troponin I (High Sensitivity) 3 <18 ng/L  Troponin I (High Sensitivity)     Status: None   Collection Time: 04/12/19 10:39 PM  Result Value Ref Range   Troponin I (High Sensitivity) 3 <18 ng/L   ____________________________________________  EKG My review and personal interpretation at Time: 19:48   Indication: dizziness  Rate: 70  Rhythm: sinus Axis: normal Other:  normal intervals, no stemi, ____________________________________________  RADIOLOGY  I personally reviewed all radiographic images ordered to evaluate for the above acute complaints and reviewed radiology reports and findings.  These findings were personally discussed with the patient.  Please see medical record for radiology report.  ____________________________________________   PROCEDURES  Procedure(s) performed:  Procedures    Critical Care performed: no ____________________________________________   INITIAL IMPRESSION / ASSESSMENT AND PLAN / ED COURSE  Pertinent labs & imaging results that were available during my care of the patient were reviewed by me and considered in my medical decision making (see chart for details).   DDX: vertigo, bppv, dehydration, pna, acs, cva, tia, labyrinthitis  Jacqueline Delgado is a 68 y.o. who presents to the ED with symptoms as described above.  Currently afebrile and hemodynamically stable.  She is well-appearing with benign and nonfocal neuro exam.  History most consistent with vertigo given her chest pain age and risk factors will order serial enzymes to evaluate for any ACS but I do suspect as she alluded to that there is a component of anxiety in the setting of her vertiginous symptoms.  Neuroimaging will be ordered to exclude mass, bleed or evidence of ischemic changes.  Does not seem clinically consistent with posterior CVA or TIA.  No neck pain.  No bruits on exam.  Will give meclizine and observe.  Clinical Course as of Apr 11 2334  Mon Apr 12, 2019  2329 Patient reassessed.  Discussed results of imaging and blood work being reassuring.  Not consistent with ACS.  Repeat neuro exam is nonfocal with benign hints exam.  Do not feel that further imaging clinically indicated on emergent basis right now.  Seems most clinically consistent with vertigo.  Discussed conservative management outpatient follow-up.  Discussed signs and symptoms for which  patient should return to the ER.  Have discussed with the patient and available family all diagnostics and treatments performed thus far and all questions were answered to the best of my ability. The patient demonstrates understanding and agreement with plan.    [PR]    Clinical Course User  Index [PR] Merlyn Lot, MD   The patient was evaluated in Emergency Department today for the symptoms described in the history of present illness. He/she was evaluated in the context of the global COVID-19 pandemic, which necessitated consideration that the patient might be at risk for infection with the SARS-CoV-2 virus that causes COVID-19. Institutional protocols and algorithms that pertain to the evaluation of patients at risk for COVID-19 are in a state of rapid change based on information released by regulatory bodies including the CDC and federal and state organizations. These policies and algorithms were followed during the patient's care in the ED.   As part of my medical decision making, I reviewed the following data within the [facility] notes reviewed and incorporated, Labs reviewed, notes from prior ED visits and [place] Controlled Substance Database   ____________________________________________   FINAL CLINICAL IMPRESSION(S) / ED DIAGNOSES  Final diagnoses:  SOB (shortness of breath)  Chest pain      NEW MEDICATIONS STARTED DURING THIS VISIT:  New Prescriptions   No medications on file     Note:  This document was prepared using Dragon voice recognition software and may include unintentional dictation errors.    Merlyn Lot, MD 04/12/19 [phone]

## 2019-04-13 MED ORDER — MECLIZINE HCL 25 MG PO TABS: 25.0000 mg | ORAL_TABLET | Freq: Three times a day (TID) | ORAL | 1 refills | Status: DC | PRN

## 2019-04-22 ENCOUNTER — Ambulatory Visit: Payer: Medicare HMO

## 2019-04-22 ENCOUNTER — Encounter: Payer: Self-pay | Admitting: Podiatry

## 2019-04-22 ENCOUNTER — Other Ambulatory Visit: Payer: Self-pay

## 2019-04-22 ENCOUNTER — Ambulatory Visit: Payer: Medicare HMO | Admitting: Podiatry

## 2019-04-22 VITALS — Temp 98.3°F

## 2019-04-22 DIAGNOSIS — R234 Changes in skin texture: Secondary | ICD-10-CM

## 2019-04-22 DIAGNOSIS — M722 Plantar fascial fibromatosis: Secondary | ICD-10-CM

## 2019-04-22 NOTE — Progress Notes (Signed)
This patient presents the office with chief complaint of an open skin in the center of callus on the outside border left foot.  She says this callus has been present and sore but has opened the last 3 to 4 days.  She says that has made it very painful walking and wearing her shoes.  She denies any drainage from this painful site today.  She presents the office today for an evaluation and treatment of this painful skin lesion.  Patient states that she has been applying Vaseline to her feet at home.  Vascular  Dorsalis pedis and posterior tibial pulses are palpable  B/L.  Capillary return  WNL.  Temperature gradient is  WNL.  Skin turgor  WNL  Sensorium  Senn Weinstein monofilament wire  WNL. Normal tactile sensation.  Nail Exam  Patient has normal nails with no evidence of bacterial or fungal infection.  Orthopedic  Exam  Muscle tone and muscle strength  WNL.  No limitations of motion feet  B/L.  No crepitus or joint effusion noted.  Foot type is unremarkable and digits show no abnormalities.  Bony prominences are unremarkable.  Skin    Normal skin texture and turgor.  patient has callus along the lateral border of the left heel plantar aspect.  Patient does have fissures noted in the center of this dry callus tissue.    Fissures left heel.  ROV>  Debride skin lesion left heel.  Told to continue with vaseline and use chapstick in the fissured skin.  Gardiner Barefoot DPM

## 2019-05-11 ENCOUNTER — Telehealth: Payer: Self-pay

## 2019-05-11 ENCOUNTER — Other Ambulatory Visit: Payer: Self-pay

## 2019-05-11 ENCOUNTER — Ambulatory Visit (INDEPENDENT_AMBULATORY_CARE_PROVIDER_SITE_OTHER): Payer: Medicare HMO | Admitting: Family Medicine

## 2019-05-11 ENCOUNTER — Telehealth: Payer: Self-pay | Admitting: Family

## 2019-05-11 DIAGNOSIS — J392 Other diseases of pharynx: Secondary | ICD-10-CM

## 2019-05-11 DIAGNOSIS — Z20828 Contact with and (suspected) exposure to other viral communicable diseases: Secondary | ICD-10-CM | POA: Diagnosis not present

## 2019-05-11 DIAGNOSIS — R0981 Nasal congestion: Secondary | ICD-10-CM

## 2019-05-11 DIAGNOSIS — Z20822 Contact with and (suspected) exposure to covid-19: Secondary | ICD-10-CM

## 2019-05-11 NOTE — Telephone Encounter (Signed)
Called and spoke to pt.  Pt said that she was around her daughter-in-law last Thursday and Friday who tested positive for COVID.  Pt said that she has a sore throat, hoarseness a little cough, hot flashes, headaches, and wakes up congested every morning.  Pt said that she has been using a humidifier.  No fever.  Pt said her temp this morning was 97.6.    Pt wanted to be advised about getting a COVID test.    Pt scheduled a virtual visit with PCP tomorrow at 4:00 pm.  Pt had received another call from the office and was scheduled a virtual visit w/ Lauren Guse today at 1:20 pm.    Appt w/ PCP on tomorrow @ 4:00 pm was cancelled.

## 2019-05-11 NOTE — Telephone Encounter (Signed)
Copied from [place] [id number]. Topic: General - Other >> May 06, 2019 10:49 AM Leward Quan A wrote: Reason for CRM: Patient called to ask Dr Vidal Schwalbe if she should get tested for COVID because her daughter in law was tested positive and she have been in close contact. She is not sure if she is having symptoms but have some congestions, not sure if she have fever or just hot flashes and very little cough. Please advise  Ph# [phone] >> May 06, 2019 11:10 AM Myatt, Marland Kitchen wrote: Not our pt

## 2019-05-11 NOTE — Telephone Encounter (Signed)
Copied from [place] [id number]. Topic: General - Other >> May 06, 2019 10:49 AM Leward Quan A wrote: Reason for CRM: Patient called to ask Dr Vidal Schwalbe if she should get tested for COVID because her daughter in law was tested positive and she have been in close contact. She is not sure if she is having symptoms but have some congestions, not sure if she have fever or just hot flashes and very little cough. Please advise  Ph# [phone] >> May 06, 2019 11:10 AM Myatt, Marland Kitchen wrote: Not our pt

## 2019-05-11 NOTE — Telephone Encounter (Signed)
No problem! Will route to CMA.

## 2019-05-11 NOTE — Telephone Encounter (Signed)
Denisa- Can you please send this to the appropriate staff personal to handle this case. Pt has been waiting since 7.23.20 to hear from someone. PEC routed to wrong dept and crm was resolved without being resolved    7.23.20--Reason for CRM: Patient called to ask Dr Vidal Schwalbe if she should get tested for COVID because her daughter in law was tested positive and she have been in close contact. She is not sure if she is having symptoms but have some congestions, not sure if she have fever or just hot flashes and very little cough. Please advise  Ph# [phone]

## 2019-05-11 NOTE — Progress Notes (Signed)
Patient ID: Jacqueline Delgado, female   DOB: 1951-02-04, 68 y.o.   MRN: [id number]    Virtual Visit via Video Note  This visit type was conducted due to national recommendations for restrictions regarding the COVID-19 pandemic (e.g. social distancing).  This format is felt to be most appropriate for this patient at this time.  All issues noted in this document were discussed and addressed.  No physical exam was performed (except for noted visual exam findings with Video Visits).   I connected with Jacqueline Delgado today at  1:20 PM EDT by a video enabled telemedicine application and verified that I am speaking with the correct person using two identifiers. Location patient: home Location provider: work or home office Persons participating in the virtual visit: patient, provider  I discussed the limitations, risks, security and privacy concerns of performing an evaluation and management service by video and the availability of in person appointments. I also discussed with the patient that there may be a patient responsible charge related to this service. The patient expressed understanding and agreed to proceed.    HPI:  Patient and I connected to discuss COVID-19 testing.  Patient states her daughter-in-law tested positive last week.  States she has been interacting with her daughter-in-law off and on over the past 3 weeks; she will watch her 39-year-old grandchild who is the daughter-in-law's daughter at times so it is highly possible she did come into close contact with COVID-19.  Patient currently denies feeling unwell.  States in the morning she will wake up with a little bit of nasal congestion and a little scratchy throat, but this usually clears up as the day goes on after using her Flonase.  She does have seasonal allergies.  She also has a mild cough at times, but has had this for years and attributes it to what her acid reflux is acting up.  Denies fever or chills.  Denies body aches.  Denies  shortness of breath or wheezing.  Denies nausea or vomiting.  Denies diarrhea.  States her appetite is normal and she has been keeping up good fluid intake.  Patient already works from home and does not leave her house other than to go to the grocery store on occasion.  She does care for 35-year-old great grandbaby, but states she has no choice but to care for the baby.   ROS: See pertinent positives and negatives per HPI.  Past Medical History:  Diagnosis Date  . Anginal pain ([place])     3/8-12/21/10  . Arthritis    Osteoarthritis in BLE knee  . Colon polyp   . GERD (gastroesophageal reflux disease)   . Hiatal hernia   . History of hiatal hernia   . Hypertension    controlled  . Obesity   . Reflux   . Sleep apnea    NO CPAP  . Small vessel disease [facility])     Past Surgical History:  Procedure Laterality Date  . ABDOMINAL HYSTERECTOMY     partial  . CHOLECYSTECTOMY    . COLONOSCOPY W/ POLYPECTOMY     adenomatous colon polyp  . COLONOSCOPY WITH PROPOFOL N/A 12/10/2017   Procedure: COLONOSCOPY WITH PROPOFOL;  Surgeon: Manya Silvas, MD;  Location: [facility] ENDOSCOPY;  Service: Endoscopy;  Laterality: N/A;  . ESOPHAGOGASTRODUODENOSCOPY (EGD) WITH PROPOFOL N/A 10/11/2015   Procedure: ESOPHAGOGASTRODUODENOSCOPY (EGD) WITH PROPOFOL;  Surgeon: Manya Silvas, MD;  Location: [facility] ENDOSCOPY;  Service: Endoscopy;  Laterality: N/A;  . ESOPHAGOGASTRODUODENOSCOPY (EGD) WITH PROPOFOL N/A  12/10/2017   Procedure: ESOPHAGOGASTRODUODENOSCOPY (EGD) WITH PROPOFOL;  Surgeon: Manya Silvas, MD;  Location: [facility] ENDOSCOPY;  Service: Endoscopy;  Laterality: N/A;  . JOINT REPLACEMENT Right    knee, Oct 2012  . JOINT REPLACEMENT     hopefully getting left partial knee replacement in July 2016  . SAVORY DILATION N/A 10/11/2015   Procedure: SAVORY DILATION;  Surgeon: Manya Silvas, MD;  Location: [facility] ENDOSCOPY;  Service: Endoscopy;  Laterality: N/A;  . SHOULDER ARTHROSCOPY WITH ROTATOR CUFF REPAIR  AND SUBACROMIAL DECOMPRESSION Right 06/14/2015   Procedure: SHOULDER ARTHROSCOPY WITH mini open ROTATOR CUFF REPAIR AND SUBACROMIAL DECOMPRESSION, release long head biceps tendon.;  Surgeon: Leanor Kail, MD;  Location: [facility] ORS;  Service: Orthopedics;  Laterality: Right;    Family History  Problem Relation Age of Onset  . Alcoholism Other        brother  . Arthritis Other        parent  . Breast cancer Other        mother and grandmother  . Hyperlipidemia Father   . Heart disease Father   . Stroke Father   . Hypertension Father   . Diabetes Other        parent and other family member  . Breast cancer Mother 19  . Breast cancer Maternal Grandmother        young   Social History   Tobacco Use  . Smoking status: Never Smoker  . Smokeless tobacco: Never Used  Substance Use Topics  . Alcohol use: No    Current Outpatient Medications:  .  amLODipine (NORVASC) 5 MG tablet, amlodipine 5 mg tablet, Disp: , Rfl:  .  buPROPion (WELLBUTRIN XL) 300 MG 24 hr tablet, Take 1 tablet (300 mg total) by mouth daily., Disp: 90 tablet, Rfl: 3 .  RABEprazole (ACIPHEX) 20 MG tablet, Take 1 tablet (20 mg total) by mouth daily. Take 1 tablet twice a day., Disp: 60 tablet, Rfl: 3 .  traMADol (ULTRAM) 50 MG tablet, TAKE 1 TABLET (50 MG TOTAL) BY MOUTH EVERY 6 (SIX) HOURS AS NEEDED FOR PAIN FOR UP TO 30 DOSES, Disp: , Rfl:   EXAM:  GENERAL: alert, oriented, appears well and in no acute distress  HEENT: atraumatic, conjunttiva clear, no obvious abnormalities on inspection of external nose and ears  NECK: normal movements of the head and neck  LUNGS: on inspection no signs of respiratory distress, breathing rate appears normal, no obvious gross SOB, gasping or wheezing  CV: no obvious cyanosis  MS: moves all visible extremities without noticeable abnormality  PSYCH/NEURO: pleasant and cooperative, no obvious depression or anxiety, speech and thought processing grossly intact  ASSESSMENT AND  PLAN:  Discussed the following assessment and plan:  Positive COVID-19 exposure, scratchy throat, nasal congestion - advised that due to symptoms & +exposure, we need to get patient set up for COVID-19 testing. Symptoms could be related to seasonal allergies, but difficult to determine without testing. Patient advised that I will put order in and he can go to testing location for for drive-through testing. Patient given the address of testing site.  Patient advised that testing is taking 2 to 7 days to result, and while we are awaiting results patient must remain under self quarantine and monitor for any changing/worsening symptoms.  Advised over-the-counter medications such as Tylenol can be used to help treat pain or fevers, Robitussin can be used to help calm cough, allergy medication such as Claritin or Allegra can help reduce congestion.  Also  discussed getting plenty of rest and increasing fluid intake.  Made patient aware that test results as well as how his symptoms progress will determine when the self quarantine will be able to end.  Also advised to monitor self for any worsening symptoms, advised if severe shortness of breath develops, high fever that is not reduced with use of Tylenol, chest pain, severe vomiting or diarrhea  --patient must call on-call and or go to ER right away for evaluation. patient verbalized understanding of these instructions.  Advised if possible, I would recommend someone else caring for the baby, however patient states this is not possible.  In that case, patient advised to keep also close eye on baby for any development of symptoms and call child's pediatrician right away if any symptoms do develop.    I discussed the assessment and treatment plan with the patient. The patient was provided an opportunity to ask questions and all were answered. The patient agreed with the plan and demonstrated an understanding of the instructions.   The patient was advised to call  back or seek an in-person evaluation if the symptoms worsen or if the condition fails to improve as anticipated.  I provided 15 minutes of video face-to-face time during this encounter.   Jodelle Green, FNP

## 2019-05-12 ENCOUNTER — Ambulatory Visit: Payer: Medicare HMO | Admitting: Family

## 2019-05-12 ENCOUNTER — Other Ambulatory Visit: Payer: Self-pay

## 2019-05-12 DIAGNOSIS — Z20822 Contact with and (suspected) exposure to covid-19: Secondary | ICD-10-CM

## 2019-05-12 DIAGNOSIS — R6889 Other general symptoms and signs: Secondary | ICD-10-CM | POA: Diagnosis not present

## 2019-05-12 NOTE — Telephone Encounter (Signed)
Copied from [place] [id number]. Topic: General - Other >> May 06, 2019 10:49 AM Leward Quan A wrote: Reason for CRM: Patient called to ask Dr Vidal Schwalbe if she should get tested for COVID because her daughter in law was tested positive and she have been in close contact. She is not sure if she is having symptoms but have some congestions, not sure if she have fever or just hot flashes and very little cough. Please advise  Ph# [phone] >> May 06, 2019 11:10 AM Myatt, Marland Kitchen wrote: Not our pt

## 2019-05-12 NOTE — Telephone Encounter (Signed)
Patient had appointment with Philis Nettle, NP yesterday afternoon. She has orders to be tested.

## 2019-05-12 NOTE — Telephone Encounter (Signed)
Virtual appt with lauren yesterday

## 2019-05-13 ENCOUNTER — Ambulatory Visit (INDEPENDENT_AMBULATORY_CARE_PROVIDER_SITE_OTHER): Payer: Medicare HMO

## 2019-05-13 DIAGNOSIS — Z0000 Encounter for general adult medical examination without abnormal findings: Secondary | ICD-10-CM | POA: Diagnosis not present

## 2019-05-13 NOTE — Patient Instructions (Addendum)
  Jacqueline Delgado , Thank you for taking time to come for your Medicare Wellness Visit. I appreciate your ongoing commitment to your health goals. Please review the following plan we discussed and let me know if I can assist you in the future.   These are the goals we discussed: Goals      Patient Stated   . Increase physical activity (pt-stated)     Purchase a treadmill for exercise       This is a list of the screening recommended for you and due dates:  Health Maintenance  Topic Date Due  .  Hepatitis C: One time screening is recommended by Center for Disease Control  (CDC) for  adults born from [birth year] through 1964.   1951-05-03  . Tetanus Vaccine  02/10/1971  . DEXA scan (bone density measurement)  02/09/2017  . Pneumonia vaccines (1 of 2 - PCV13) 02/09/2017  . Mammogram  08/07/2018  . Flu Shot  05/15/2019  . Colon Cancer Screening  12/11/2027

## 2019-05-13 NOTE — Progress Notes (Addendum)
Subjective:   Jacqueline Delgado is a 68 y.o. female who presents for an Initial Medicare Annual Wellness Visit.  Review of Systems    No ROS.  Medicare Wellness Virtual Visit.  Visual/audio telehealth visit, UTA vital signs.   See social history for additional risk factors.    Cardiac Risk Factors include: advanced age (>67men, >1 women);hypertension     Objective:    Today's Vitals   There is no height or weight on file to calculate BMI.  Advanced Directives 05/13/2019 04/12/2019 11/16/2018 09/28/2018 12/10/2017 10/12/2017 02/25/2017  Does Patient Have a Medical Advance Directive? No No No No No No No  Would patient like information on creating a medical advance directive? No - Patient declined - No - Patient declined - No - Patient declined - No - Patient declined    Current Medications (verified) Outpatient Encounter Medications as of 05/13/2019  Medication Sig  . amLODipine (NORVASC) 5 MG tablet amlodipine 5 mg tablet  . RABEprazole (ACIPHEX) 20 MG tablet Take 1 tablet (20 mg total) by mouth daily. Take 1 tablet twice a day.  . traMADol (ULTRAM) 50 MG tablet TAKE 1 TABLET (50 MG TOTAL) BY MOUTH EVERY 6 (SIX) HOURS AS NEEDED FOR PAIN FOR UP TO 30 DOSES  . buPROPion (WELLBUTRIN XL) 300 MG 24 hr tablet Take 1 tablet (300 mg total) by mouth daily. (Patient not taking: Reported on 05/13/2019)   No facility-administered encounter medications on file as of 05/13/2019.     Allergies (verified) Ibuprofen   History: Past Medical History:  Diagnosis Date  . Anginal pain ([place])     3/8-12/21/10  . Arthritis    Osteoarthritis in BLE knee  . Colon polyp   . GERD (gastroesophageal reflux disease)   . Hiatal hernia   . History of hiatal hernia   . Hypertension    controlled  . Obesity   . Reflux   . Sleep apnea    NO CPAP  . Small vessel disease [facility])    Past Surgical History:  Procedure Laterality Date  . ABDOMINAL HYSTERECTOMY     partial  . CHOLECYSTECTOMY    . COLONOSCOPY  W/ POLYPECTOMY     adenomatous colon polyp  . COLONOSCOPY WITH PROPOFOL N/A 12/10/2017   Procedure: COLONOSCOPY WITH PROPOFOL;  Surgeon: Manya Silvas, MD;  Location: [facility] ENDOSCOPY;  Service: Endoscopy;  Laterality: N/A;  . ESOPHAGOGASTRODUODENOSCOPY (EGD) WITH PROPOFOL N/A 10/11/2015   Procedure: ESOPHAGOGASTRODUODENOSCOPY (EGD) WITH PROPOFOL;  Surgeon: Manya Silvas, MD;  Location[facility] ENDOSCOPY;  Service: Endoscopy;  Laterality: N/A;  . ESOPHAGOGASTRODUODENOSCOPY (EGD) WITH PROPOFOL N/A 12/10/2017   Procedure: ESOPHAGOGASTRODUODENOSCOPY (EGD) WITH PROPOFOL;  Surgeon: Manya Silvas, MD;  Location: [facility] ENDOSCOPY;  Service: Endoscopy;  Laterality: N/A;  . JOINT REPLACEMENT Right    knee, Oct 2012  . JOINT REPLACEMENT     hopefully getting left partial knee replacement in July 2016  . SAVORY DILATION N/A 10/11/2015   Procedure: SAVORY DILATION;  Surgeon: Manya Silvas, MD;  Location: [facility] ENDOSCOPY;  Service: Endoscopy;  Laterality: N/A;  . SHOULDER ARTHROSCOPY WITH ROTATOR CUFF REPAIR AND SUBACROMIAL DECOMPRESSION Right 06/14/2015   Procedure: SHOULDER ARTHROSCOPY WITH mini open ROTATOR CUFF REPAIR AND SUBACROMIAL DECOMPRESSION, release long head biceps tendon.;  Surgeon: Leanor Kail, MD;  Location: [facility] ORS;  Service: Orthopedics;  Laterality: Right;   Family History  Problem Relation Age of Onset  . Alcoholism Other        brother  . Arthritis Other  parent  . Breast cancer Other        mother and grandmother  . Hyperlipidemia Father   . Heart disease Father   . Stroke Father   . Hypertension Father   . Diabetes Other        parent and other family member  . Breast cancer Mother 15  . Breast cancer Maternal Grandmother        young   Social History   Socioeconomic History  . Marital status: Single    Spouse name: Not on file  . Number of children: Not on file  . Years of education: Not on file  . Highest education level: Not on file  Occupational  History  . Not on file  Social Needs  . Financial resource strain: Not hard at all  . Food insecurity    Worry: Never true    Inability: Never true  . Transportation needs    Medical: No    Non-medical: No  Tobacco Use  . Smoking status: Never Smoker  . Smokeless tobacco: Never Used  Substance and Sexual Activity  . Alcohol use: No  . Drug use: No  . Sexual activity: Not on file  Lifestyle  . Physical activity    Days per week: 0 days    Minutes per session: Not on file  . Stress: To some extent  Relationships  . Social Herbalist on phone: Not on file    Gets together: Not on file    Attends religious service: Not on file    Active member of club or organization: Not on file    Attends meetings of clubs or organizations: Not on file    Relationship status: Not on file  Other Topics Concern  . Not on file  Social History Narrative  . Not on file    Tobacco Counseling Counseling given: Not Answered   Clinical Intake:  Pre-visit preparation completed: Yes        Diabetes: No  How often do you need to have someone help you when you read instructions, pamphlets, or other written materials from your doctor or pharmacy?: 1 - Never  Interpreter Needed?: No      Activities of Daily Living In your present state of health, do you have any difficulty performing the following activities: 05/13/2019  Hearing? N  Vision? N  Difficulty concentrating or making decisions? N  Walking or climbing stairs? Y  Comment Chronic knee pain  Dressing or bathing? N  Doing errands, shopping? N  Preparing Food and eating ? N  Using the Toilet? N  In the past six months, have you accidently leaked urine? N  Do you have problems with loss of bowel control? N  Managing your Medications? N  Managing your Finances? N  Housekeeping or managing your Housekeeping? N  Some recent data might be hidden     Immunizations and Health Maintenance Immunization History   Administered Date(s) Administered  . Influenza Whole 05/23/2018   Health Maintenance Due  Topic Date Due  . Hepatitis C Screening  June 10, 1951  . TETANUS/TDAP  02/10/1971  . DEXA SCAN  02/09/2017  . PNA vac Low Risk Adult (1 of 2 - PCV13) 02/09/2017  . MAMMOGRAM  08/07/2018    Patient Care Team: Burnard Hawthorne, FNP as PCP - General (Family Medicine)  Indicate any recent Medical Services you may have received from other than [facility] providers in the past year (date may be approximate).  Assessment:   This is a routine wellness examination for [place].  I connected with patient 05/13/19 at  1:00 PM EDT by an audio enabled telemedicine application and verified that I am speaking with the correct person using two identifiers. Patient stated full name and DOB. Patient gave permission to continue with virtual visit. Patient's location was at home and Nurse's location was at [place] office.   Health Screenings  Mammogram 3D- 07/2016 Colonoscopy - 11/2017 Bone Density - discussed, deferred per patient preference Glaucoma -none Hearing -demonstrates normal hearing during visit. Hemoglobin A1C - 11/2018 (5.6) Cholesterol - 11/2018 Hepatitis C Screening- discussed, deferred per patient preference Dental- visits every 6 months  Vision- visits within the last 12 months  Social  Alcohol intake - no         Smoking history- never    Smokers in home? none Illicit drug use? none Exercise - no routine. Increase activity encouraged.  Diet - regular BMI- discussed the importance of a healthy diet, water intake and the benefits of aerobic exercise.  Educational material provided.   Safety  Patient feels safe at home- yes Patient does have smoke detectors at home- yes Patient does wear sunscreen or protective clothing when in direct sunlight -yes Patient does wear seat belt when in a moving vehicle -yes Patient drives- yes  [id number] precautions and sickness symptoms discussed.    Activities of Daily Living Patient denies needing assistance with: driving, household chores, feeding themselves, getting from bed to chair, getting to the toilet, bathing/showering, dressing, managing money, or preparing meals.  No new identified risk were noted.    Depression Screen Patient denies losing interest in daily life, feeling hopeless, or crying easily over simple problems.   Medication-taking as directed and without issues.   Fall Screen Patient denies being afraid of falling or falling in the last year.   Memory Screen Patient is alert.  Patient denies difficulty focusing, concentrating or misplacing items. Correctly identified the president of the [place], season and recall. Patient likes to play games on the ipad for brain stimulation.  Immunizations The following Immunizations were discussed: Influenza, shingles, pneumonia, and tetanus.   Other Providers Patient Care Team: Burnard Hawthorne, FNP as PCP - General (Family Medicine)  Hearing/Vision screen  Hearing Screening   125Hz  250Hz  500Hz  1000Hz  2000Hz  3000Hz  4000Hz  6000Hz  8000Hz   Right ear:           Left ear:           Comments: Patient is able to hear conversational tones without difficulty.  No issues reported.   Vision Screening Comments: Visual acuity not assessed, virtual visit.  They have seen their ophthalmologist in the last 12 months.     Dietary issues and exercise activities discussed: Current Exercise Habits: The patient does not participate in regular exercise at present  Goals      Patient Stated   . Increase physical activity (pt-stated)     Purchase a treadmill for exercise      Depression Screen PHQ 2/9 Scores 05/13/2019 05/11/2019 11/18/2018 11/11/2018 07/06/2015  PHQ - 2 Score 1 0 2 3 3   PHQ- 9 Score - 0 9 13 15     Fall Risk Fall Risk  05/13/2019  Falls in the past year? 0  Is the patient's home free of loose throw rugs in walkways, pet beds, electrical cords, etc?  yes       Grab bars in the bathroom? yes      Handrails on the stairs?  yes      Adequate lighting? yes  Cognitive Function:     6CIT Screen 05/13/2019  What Year? 0 points  What month? 0 points  What time? 0 points  Count back from 20 0 points  Months in reverse 0 points    Screening Tests Health Maintenance  Topic Date Due  . Hepatitis C Screening  01/06/1951  . TETANUS/TDAP  02/10/1971  . DEXA SCAN  02/09/2017  . PNA vac Low Risk Adult (1 of 2 - PCV13) 02/09/2017  . MAMMOGRAM  08/07/2018  . INFLUENZA VACCINE  05/15/2019  . COLONOSCOPY  12/11/2027     Plan:    End of life planning; Advance aging; Advanced directives discussed.  Copy of current HCPOA/Living Will requested upon completion.    I have personally reviewed and noted the following in the patient's chart:   . Medical and social history . Use of alcohol, tobacco or illicit drugs  . Current medications and supplements . Functional ability and status . Nutritional status . Physical activity . Advanced directives . List of other physicians . Hospitalizations, surgeries, and ER visits in previous 12 months . Vitals . Screenings to include cognitive, depression, and falls . Referrals and appointments  In addition, I have reviewed and discussed with patient certain preventive protocols, quality metrics, and best practice recommendations. A written personalized care plan for preventive services as well as general preventive health recommendations were provided to patient.     Varney Biles, LPN   4/[id number]/9295   Agree with plan. Mable Paris, NP

## 2019-05-14 ENCOUNTER — Encounter: Payer: Self-pay | Admitting: Family

## 2019-05-14 ENCOUNTER — Ambulatory Visit (INDEPENDENT_AMBULATORY_CARE_PROVIDER_SITE_OTHER): Payer: Medicare HMO | Admitting: Family

## 2019-05-14 ENCOUNTER — Other Ambulatory Visit: Payer: Self-pay

## 2019-05-14 VITALS — Wt 267.0 lb

## 2019-05-14 DIAGNOSIS — R232 Flushing: Secondary | ICD-10-CM

## 2019-05-14 DIAGNOSIS — I10 Essential (primary) hypertension: Secondary | ICD-10-CM | POA: Diagnosis not present

## 2019-05-14 DIAGNOSIS — R82998 Other abnormal findings in urine: Secondary | ICD-10-CM | POA: Diagnosis not present

## 2019-05-14 DIAGNOSIS — R319 Hematuria, unspecified: Secondary | ICD-10-CM | POA: Insufficient documentation

## 2019-05-14 DIAGNOSIS — Z1239 Encounter for other screening for malignant neoplasm of breast: Secondary | ICD-10-CM | POA: Insufficient documentation

## 2019-05-14 LAB — NOVEL CORONAVIRUS, NAA: SARS-CoV-2, NAA: NOT DETECTED

## 2019-05-14 NOTE — Patient Instructions (Addendum)
Go to [facility] for urine.   Purchase blood pressure cuff.   Monitor blood pressure,  Goal is less than 120/80, based on newest guidelines; if persistently higher, please make sooner follow up appointment so we can recheck you blood pressure and manage medications  Ensure you follow up with pulmonology in regards to sleep study. Please reschedule when you feel comfortable.   Please call call and schedule your 3D mammogram, bone density scan as discussed.   [place]  [place], [place]   Due for tetanus vaccine ( Tdap) ; may have at local pharmacy.   Prevnar 13 - can we can here in office. We will call to schedule an appointment.  Let me know if hot flashes continue as I would further evaluate.   Stay safe!

## 2019-05-14 NOTE — Assessment & Plan Note (Signed)
Pending urine studies. She will go to [place].

## 2019-05-14 NOTE — Assessment & Plan Note (Addendum)
BP Readings from Last 3 Encounters:  04/12/19 (!) 162/109  12/22/18 138/84  12/09/18 124/72   Patient plans to order BP cuff and monitor at home. She will let me know.

## 2019-05-14 NOTE — Assessment & Plan Note (Signed)
Patient will schedule

## 2019-05-14 NOTE — Progress Notes (Signed)
This visit type was conducted due to national recommendations for restrictions regarding the COVID-19 pandemic (e.g. social distancing).  This format is felt to be most appropriate for this patient at this time.  All issues noted in this document were discussed and addressed.  No physical exam was performed (except for noted visual exam findings with Video Visits). Virtual Visit via Video Note  I connected with@  on 05/14/19 at  8:00 AM EDT by a video enabled telemedicine application and verified that I am speaking with the correct person using two identifiers.  Location patient: home Location provider:work  Persons participating in the virtual visit: patient, provider  I discussed the limitations of evaluation and management by telemedicine and the availability of in person appointments. The patient expressed understanding and agreed to proceed.  Interactive audio and video telecommunications were attempted between this provider and patient, however failed, due to patient having technical difficulties or patient did not have access to video capability.  We continued and completed visit with audio only.   HPI:  HTN- Doesn't have BP monitor at home. No CP.   Complains of new onset of hot flashes x 2 weeks. Will wake up at night feeling 'hot' and will throw sheets off. Temperature 97.11F.  No drenching sheet. No unusual weight loss. 'Short lived.' h/o hysterectomy at 31 years with left ovary intact. No vaginal bleeding.   Due mammogram, dexa, prevnar 13.   Complains of dark urine. No dysuria, vaginal itching, urinary frequency, changes in vaginal discharge. Feels needs to increase more water.   Still has hoarseness. No SOB, cp, cough, fever.  COVID negative  Stopped taking wellbutrin as felt more irritible. Helped somewhat with appetite. 267lbs.   Hasnt scheduled OSA. Plans to wait until COVID has improved.   ED vertigo 04/12/19. CT head negative. Troponin negative. Hasnt needed meclizine,  nor any repeat episode. No falls. No ha, vision loss.    ROS: See pertinent positives and negatives per HPI.  Past Medical History:  Diagnosis Date  . Anginal pain ([place])     3/8-12/21/10  . Arthritis    Osteoarthritis in BLE knee  . Colon polyp   . GERD (gastroesophageal reflux disease)   . Hiatal hernia   . History of hiatal hernia   . Hypertension    controlled  . Obesity   . Reflux   . Sleep apnea    NO CPAP  . Small vessel disease [facility])     Past Surgical History:  Procedure Laterality Date  . ABDOMINAL HYSTERECTOMY     partial  . CHOLECYSTECTOMY    . COLONOSCOPY W/ POLYPECTOMY     adenomatous colon polyp  . COLONOSCOPY WITH PROPOFOL N/A 12/10/2017   Procedure: COLONOSCOPY WITH PROPOFOL;  Surgeon: Manya Silvas, MD;  Location: [facility] ENDOSCOPY;  Service: Endoscopy;  Laterality: N/A;  . ESOPHAGOGASTRODUODENOSCOPY (EGD) WITH PROPOFOL N/A 10/11/2015   Procedure: ESOPHAGOGASTRODUODENOSCOPY (EGD) WITH PROPOFOL;  Surgeon: Manya Silvas, MD;  Location[facility] ENDOSCOPY;  Service: Endoscopy;  Laterality: N/A;  . ESOPHAGOGASTRODUODENOSCOPY (EGD) WITH PROPOFOL N/A 12/10/2017   Procedure: ESOPHAGOGASTRODUODENOSCOPY (EGD) WITH PROPOFOL;  Surgeon: Manya Silvas, MD;  Location[facility] ENDOSCOPY;  Service: Endoscopy;  Laterality: N/A;  . JOINT REPLACEMENT Right    knee, Oct 2012  . JOINT REPLACEMENT     hopefully getting left partial knee replacement in July 2016  . SAVORY DILATION N/A 10/11/2015   Procedure: SAVORY DILATION;  Surgeon: Manya Silvas, MD;  Location: [facility] ENDOSCOPY;  Service: Endoscopy;  Laterality: N/A;  . SHOULDER ARTHROSCOPY WITH ROTATOR CUFF REPAIR AND SUBACROMIAL DECOMPRESSION Right 06/14/2015   Procedure: SHOULDER ARTHROSCOPY WITH mini open ROTATOR CUFF REPAIR AND SUBACROMIAL DECOMPRESSION, release long head biceps tendon.;  Surgeon: Leanor Kail, MD;  Location: [facility] ORS;  Service: Orthopedics;  Laterality: Right;    Family History  Problem Relation Age  of Onset  . Alcoholism Other        brother  . Arthritis Other        parent  . Breast cancer Other        mother and grandmother  . Hyperlipidemia Father   . Heart disease Father   . Stroke Father   . Hypertension Father   . Diabetes Other        parent and other family member  . Breast cancer Mother 60  . Breast cancer Maternal Grandmother        young    SOCIAL HX: never smoker   Current Outpatient Medications:  .  amLODipine (NORVASC) 5 MG tablet, amlodipine 5 mg tablet, Disp: , Rfl:  .  RABEprazole (ACIPHEX) 20 MG tablet, Take 1 tablet (20 mg total) by mouth daily. Take 1 tablet twice a day., Disp: 60 tablet, Rfl: 3 .  traMADol (ULTRAM) 50 MG tablet, TAKE 1 TABLET (50 MG TOTAL) BY MOUTH EVERY 6 (SIX) HOURS AS NEEDED FOR PAIN FOR UP TO 30 DOSES, Disp: , Rfl:   EXAM:  VITALS per patient if applicable: Wt Readings from Last 3 Encounters:  05/14/19 267 lb (121.1 kg)  04/12/19 266 lb (120.7 kg)  12/22/18 261 lb 12.8 oz (118.8 kg)     GENERAL: alert, oriented, appears well and in no acute distress  HEENT: atraumatic, conjunttiva clear, no obvious abnormalities on inspection of external nose and ears  NECK: normal movements of the head and neck  LUNGS: on inspection no signs of respiratory distress, breathing rate appears normal, no obvious gross SOB, gasping or wheezing  CV: no obvious cyanosis  MS: moves all visible extremities without noticeable abnormality  PSYCH/NEURO: pleasant and cooperative, no obvious depression or anxiety, speech and thought processing grossly intact  ASSESSMENT AND PLAN:  Discussed the following assessment and plan:  Problem List Items Addressed This Visit      Cardiovascular and Mediastinum   Essential hypertension    BP Readings from Last 3 Encounters:  04/12/19 (!) 162/109  12/22/18 138/84  12/09/18 124/72   Patient plans to order BP cuff and monitor at home. She will let me know.       Hot flashes    Differential include  menopausal, AE from wellbutrin, hot weather. Patient will let me know if persists so we can pursue further evaluation.           Other   Dark urine    Pending urine studies. She will go to [place].       Relevant Orders   Urinalysis, Routine w reflex microscopic   Urine Culture   Screening for breast cancer - Primary    Patient will schedule.       Relevant Orders   MM 3D SCREEN BREAST BILATERAL   DG Bone Density        I discussed the assessment and treatment plan with the patient. The patient was provided an opportunity to ask questions and all were answered. The patient agreed with the plan and demonstrated an understanding of the instructions.   The patient was advised to call back or seek an in-person  evaluation if the symptoms worsen or if the condition fails to improve as anticipated.   Mable Paris, FNP   I spent 25 min non face to face w/ pt.

## 2019-05-14 NOTE — Assessment & Plan Note (Addendum)
Differential include menopausal, AE from wellbutrin, hot weather. Patient will let me know if persists so we can pursue further evaluation.

## 2019-05-17 NOTE — Progress Notes (Signed)
Printed and mailed

## 2019-05-19 ENCOUNTER — Telehealth: Payer: Self-pay

## 2019-05-19 NOTE — Telephone Encounter (Signed)
I called patient to schedule prevnar 13 & she stated that she would call back if she decided to get the vaccine. She did not feel the need at this time.

## 2019-05-20 ENCOUNTER — Encounter: Payer: Self-pay | Admitting: Family

## 2019-07-03 ENCOUNTER — Other Ambulatory Visit: Payer: Self-pay | Admitting: Family

## 2019-07-30 ENCOUNTER — Telehealth: Payer: Self-pay | Admitting: *Deleted

## 2019-07-30 DIAGNOSIS — R82998 Other abnormal findings in urine: Secondary | ICD-10-CM

## 2019-07-30 NOTE — Telephone Encounter (Signed)
Pt was put on the lab scheduled for 10/20 for a urine. The urine test was ordered in July for dark urine. Not sure if this patient needs to be re-evaluated or not. If okay for her to collect a urine without an appt, please reorder correctly. Orders need to be future.  Thanks

## 2019-08-03 ENCOUNTER — Other Ambulatory Visit: Payer: Medicare HMO

## 2019-08-04 ENCOUNTER — Other Ambulatory Visit (INDEPENDENT_AMBULATORY_CARE_PROVIDER_SITE_OTHER): Payer: Medicare HMO

## 2019-08-04 ENCOUNTER — Other Ambulatory Visit: Payer: Self-pay

## 2019-08-04 DIAGNOSIS — R82998 Other abnormal findings in urine: Secondary | ICD-10-CM | POA: Diagnosis not present

## 2019-08-04 LAB — URINALYSIS, ROUTINE W REFLEX MICROSCOPIC
Bilirubin Urine: NEGATIVE
Hgb urine dipstick: NEGATIVE
Ketones, ur: NEGATIVE
Nitrite: NEGATIVE
Specific Gravity, Urine: 1.02 (ref 1.000–1.030)
Total Protein, Urine: NEGATIVE
Urine Glucose: NEGATIVE
Urobilinogen, UA: 0.2 (ref 0.0–1.0)
pH: 7 (ref 5.0–8.0)

## 2019-08-06 LAB — URINE CULTURE
MICRO NUMBER:: 1013989
SPECIMEN QUALITY:: ADEQUATE

## 2019-08-09 ENCOUNTER — Other Ambulatory Visit: Payer: Self-pay

## 2019-08-09 DIAGNOSIS — Z6841 Body Mass Index (BMI) 40.0 and over, adult: Secondary | ICD-10-CM | POA: Diagnosis not present

## 2019-08-09 DIAGNOSIS — M1712 Unilateral primary osteoarthritis, left knee: Secondary | ICD-10-CM | POA: Diagnosis not present

## 2019-08-09 DIAGNOSIS — Z96651 Presence of right artificial knee joint: Secondary | ICD-10-CM | POA: Diagnosis not present

## 2019-08-11 ENCOUNTER — Encounter: Payer: Self-pay | Admitting: Family Medicine

## 2019-08-11 ENCOUNTER — Other Ambulatory Visit: Payer: Self-pay

## 2019-08-11 ENCOUNTER — Ambulatory Visit (INDEPENDENT_AMBULATORY_CARE_PROVIDER_SITE_OTHER): Payer: Medicare HMO | Admitting: Family Medicine

## 2019-08-11 VITALS — Ht 64.0 in | Wt 267.0 lb

## 2019-08-11 DIAGNOSIS — R5383 Other fatigue: Secondary | ICD-10-CM | POA: Diagnosis not present

## 2019-08-11 DIAGNOSIS — M7989 Other specified soft tissue disorders: Secondary | ICD-10-CM | POA: Diagnosis not present

## 2019-08-11 DIAGNOSIS — F331 Major depressive disorder, recurrent, moderate: Secondary | ICD-10-CM | POA: Diagnosis not present

## 2019-08-11 DIAGNOSIS — R82998 Other abnormal findings in urine: Secondary | ICD-10-CM | POA: Diagnosis not present

## 2019-08-11 DIAGNOSIS — R69 Illness, unspecified: Secondary | ICD-10-CM | POA: Diagnosis not present

## 2019-08-11 DIAGNOSIS — I10 Essential (primary) hypertension: Secondary | ICD-10-CM | POA: Diagnosis not present

## 2019-08-11 MED ORDER — FLUOXETINE HCL 20 MG PO TABS: 20.0000 mg | ORAL_TABLET | Freq: Every day | ORAL | 1 refills | Status: DC

## 2019-08-11 NOTE — Progress Notes (Signed)
Patient ID: Jacqueline Delgado, female   DOB: May 18, 1951, 68 y.o.   MRN: [id number]    Virtual Visit via video Note  This visit type was conducted due to national recommendations for restrictions regarding the COVID-19 pandemic (e.g. social distancing).  This format is felt to be most appropriate for this patient at this time.  All issues noted in this document were discussed and addressed.  No physical exam was performed (except for noted visual exam findings with Video Visits).   I connected with today at 10:20 AM EDT by a video enabled telemedicine application or telephone and verified that I am speaking with the correct person using two identifiers. Location patient: home Location provider: work or home office Persons participating in the virtual visit: patient, provider  I discussed the limitations, risks, security and privacy concerns of performing an evaluation and management service by telephone and the availability of in person appointments. I also discussed with the patient that there may be a patient responsible charge related to this service. The patient expressed understanding and agreed to proceed.   HPI:  Patient and I connected via video to discuss multiple issues  First is darker urine and some swelling in lower extremities.  Urine studies were negative for UTI.  Patient has not checked BP at home recently, but has been on amlodipine for at least a year and a half.  Patient notices swelling worse at nighttime and upon waking in the a.m. it is somewhat improved.  Denies chest pain, palpitations or shortness of breath.  Denies coughing.  Patient also reports feeling extremely fatigued.  Wondering if is related to all of her stress, is working from home and has been for the past 6 or 7 months.  Also cares for her great grandchild which is demanding.   Patient reports feeling down and depressed as well.  Did take Prozac before, currently not on anything.  Is interested in restarting  medicine for depression/anxiety.     ROS: See pertinent positives and negatives per HPI.  Past Medical History:  Diagnosis Date  . Anginal pain ([place])     3/8-12/21/10  . Arthritis    Osteoarthritis in BLE knee  . Colon polyp   . GERD (gastroesophageal reflux disease)   . Hiatal hernia   . History of hiatal hernia   . Hypertension    controlled  . Obesity   . Reflux   . Sleep apnea    NO CPAP  . Small vessel disease [facility])     Past Surgical History:  Procedure Laterality Date  . ABDOMINAL HYSTERECTOMY     partial  . CHOLECYSTECTOMY    . COLONOSCOPY W/ POLYPECTOMY     adenomatous colon polyp  . COLONOSCOPY WITH PROPOFOL N/A 12/10/2017   Procedure: COLONOSCOPY WITH PROPOFOL;  Surgeon: Manya Silvas, MD;  Location[facility] ENDOSCOPY;  Service: Endoscopy;  Laterality: N/A;  . ESOPHAGOGASTRODUODENOSCOPY (EGD) WITH PROPOFOL N/A 10/11/2015   Procedure: ESOPHAGOGASTRODUODENOSCOPY (EGD) WITH PROPOFOL;  Surgeon: Manya Silvas, MD;  Location: [facility] ENDOSCOPY;  Service: Endoscopy;  Laterality: N/A;  . ESOPHAGOGASTRODUODENOSCOPY (EGD) WITH PROPOFOL N/A 12/10/2017   Procedure: ESOPHAGOGASTRODUODENOSCOPY (EGD) WITH PROPOFOL;  Surgeon: Manya Silvas, MD;  Location: [facility] ENDOSCOPY;  Service: Endoscopy;  Laterality: N/A;  . JOINT REPLACEMENT Right    knee, Oct 2012  . JOINT REPLACEMENT     hopefully getting left partial knee replacement in July 2016  . SAVORY DILATION N/A 10/11/2015   Procedure: SAVORY DILATION;  Surgeon: Gavin Pound  Vira Agar, MD;  Location: [facility] ENDOSCOPY;  Service: Endoscopy;  Laterality: N/A;  . SHOULDER ARTHROSCOPY WITH ROTATOR CUFF REPAIR AND SUBACROMIAL DECOMPRESSION Right 06/14/2015   Procedure: SHOULDER ARTHROSCOPY WITH mini open ROTATOR CUFF REPAIR AND SUBACROMIAL DECOMPRESSION, release long head biceps tendon.;  Surgeon: Leanor Kail, MD;  Location: [facility] ORS;  Service: Orthopedics;  Laterality: Right;    Family History  Problem Relation Age of Onset  .  Alcoholism Other        brother  . Arthritis Other        parent  . Breast cancer Other        mother and grandmother  . Hyperlipidemia Father   . Heart disease Father   . Stroke Father   . Hypertension Father   . Diabetes Other        parent and other family member  . Breast cancer Mother 31  . Breast cancer Maternal Grandmother        young   Social History   Tobacco Use  . Smoking status: Never Smoker  . Smokeless tobacco: Never Used  Substance Use Topics  . Alcohol use: No    Current Outpatient Medications:  .  ACIPHEX 20 MG tablet, TAKE 1 TABLET (20 MG TOTAL) BY MOUTH DAILY. TAKE 1 TABLET TWICE A DAY., Disp: 60 tablet, Rfl: 3 .  amLODipine (NORVASC) 5 MG tablet, amlodipine 5 mg tablet, Disp: , Rfl:  .  FLUoxetine (PROZAC) 20 MG tablet, Take by mouth., Disp: , Rfl:  .  meloxicam (MOBIC) 15 MG tablet, TAKE 1 TABLET BY MOUTH EVERY DAY *MAIL ORDER NEEDED*, Disp: , Rfl:  .  meloxicam (MOBIC) 15 MG tablet, Take 15 mg by mouth daily., Disp: , Rfl:  .  traMADol (ULTRAM) 50 MG tablet, TAKE 1 TABLET (50 MG TOTAL) BY MOUTH EVERY 6 (SIX) HOURS AS NEEDED FOR PAIN FOR UP TO 30 DOSES, Disp: , Rfl:   EXAM:  GENERAL: alert, oriented, appears well and in no acute distress  HEENT: atraumatic, conjunttiva clear, no obvious abnormalities on inspection of external nose and ears  NECK: normal movements of the head and neck  LUNGS: on inspection no signs of respiratory distress, breathing rate appears normal, no obvious gross SOB, gasping or wheezing  CV: no obvious cyanosis  MS: moves all visible extremities without noticeable abnormality  PSYCH/NEURO: pleasant and cooperative, no obvious depression or anxiety, speech and thought processing grossly intact  ASSESSMENT AND PLAN:  Discussed the following assessment and plan:  Depression, major, recurrent, moderate (HCC) - Plan: FLUoxetine (PROZAC) 20 MG tablet, CBC, Comp Met (CMET), TSH, B12 and Folate Panel, VITAMIN D 25 Hydroxy  (Vit-D Deficiency, Fractures)  Fatigue, unspecified type - Plan: CBC, Comp Met (CMET), TSH, B12 and Folate Panel, VITAMIN D 25 Hydroxy (Vit-D Deficiency, Fractures)  Essential hypertension - Plan: CBC, Comp Met (CMET)  Dark urine - Plan: Comp Met (CMET)  Leg swelling - Plan: Comp Met (CMET)  We will plan to have patient come into clinic sometime next week for lab work and also to do in person visit so we can look at legs and check blood pressure.  Wondering if maybe amlodipine is playing into swelling away to adjust this medicine.  We will also check kidney functions to further investigate dark urine and also further investigate fatigue with possible causes such as hypothyroid, vitamin deficiencies.  Patient will restart Prozac, this medicine worked well for her in the past.    I discussed the assessment and treatment plan with  the patient. The patient was provided an opportunity to ask questions and all were answered. The patient agreed with the plan and demonstrated an understanding of the instructions.   The patient was advised to call back or seek an in-person evaluation if the symptoms worsen or if the condition fails to improve as anticipated.  I provided 25 minutes of video-face-to-face time during this encounter.   Jodelle Green, FNP

## 2019-08-11 NOTE — Progress Notes (Signed)
Patient said that she is extremely depressed.   PHQ-9  Score-21

## 2019-09-17 ENCOUNTER — Telehealth: Payer: Self-pay | Admitting: *Deleted

## 2019-09-17 ENCOUNTER — Other Ambulatory Visit: Payer: Self-pay | Admitting: Internal Medicine

## 2019-09-17 DIAGNOSIS — Z1231 Encounter for screening mammogram for malignant neoplasm of breast: Secondary | ICD-10-CM

## 2019-09-17 NOTE — Telephone Encounter (Signed)
She can call [place] to schedule order placed  [place]

## 2019-09-17 NOTE — Telephone Encounter (Signed)
Copied from [place] [phone]. Topic: General - Other >> Sep 17, 2019  1:40 PM Burchel, Jacqueline Delgado wrote: Reason for CRM: Pt would like to sched a mammogram, she has not had one in a couple of years. Please call to advise.

## 2019-09-20 NOTE — Telephone Encounter (Signed)
I called and informed patient that order had been placed. I provided number to [facility] so she could call.

## 2019-09-21 ENCOUNTER — Other Ambulatory Visit: Payer: Self-pay

## 2019-09-21 ENCOUNTER — Other Ambulatory Visit (INDEPENDENT_AMBULATORY_CARE_PROVIDER_SITE_OTHER): Payer: Medicare HMO

## 2019-09-21 DIAGNOSIS — R82998 Other abnormal findings in urine: Secondary | ICD-10-CM | POA: Diagnosis not present

## 2019-09-21 DIAGNOSIS — F331 Major depressive disorder, recurrent, moderate: Secondary | ICD-10-CM

## 2019-09-21 DIAGNOSIS — R5383 Other fatigue: Secondary | ICD-10-CM

## 2019-09-21 DIAGNOSIS — I10 Essential (primary) hypertension: Secondary | ICD-10-CM

## 2019-09-21 DIAGNOSIS — M7989 Other specified soft tissue disorders: Secondary | ICD-10-CM

## 2019-09-21 DIAGNOSIS — R69 Illness, unspecified: Secondary | ICD-10-CM | POA: Diagnosis not present

## 2019-09-21 LAB — COMPREHENSIVE METABOLIC PANEL
ALT: 8 U/L (ref 0–35)
AST: 11 U/L (ref 0–37)
Albumin: 4.1 g/dL (ref 3.5–5.2)
Alkaline Phosphatase: 99 U/L (ref 39–117)
BUN: 12 mg/dL (ref 6–23)
CO2: 29 mEq/L (ref 19–32)
Calcium: 9.1 mg/dL (ref 8.4–10.5)
Chloride: 105 mEq/L (ref 96–112)
Creatinine, Ser: 0.86 mg/dL (ref 0.40–1.20)
GFR: 79.49 mL/min (ref >60.00)
Glucose, Bld: 79 mg/dL (ref 70–99)
Potassium: 3.6 mEq/L (ref 3.5–5.1)
Sodium: 140 mEq/L (ref 135–145)
Total Bilirubin: 0.5 mg/dL (ref 0.2–1.2)
Total Protein: 6.7 g/dL (ref 6.0–8.3)

## 2019-09-21 LAB — CBC
HCT: 35.7 % — ABNORMAL LOW (ref 36.0–46.0)
Hemoglobin: 11.2 g/dL — ABNORMAL LOW (ref 12.0–15.0)
MCHC: 31.5 g/dL (ref 30.0–36.0)
MCV: 80.2 fl (ref 78.0–100.0)
Platelets: 212 10*3/uL (ref 150.0–400.0)
RBC: 4.45 Mil/uL (ref 3.87–5.11)
RDW: 15.4 % (ref 11.5–15.5)
WBC: 4.2 10*3/uL (ref 4.0–10.5)

## 2019-09-21 LAB — TSH: TSH: 1.53 u[IU]/mL (ref 0.35–4.50)

## 2019-09-21 LAB — B12 AND FOLATE PANEL
Folate: 23.5 ng/mL (ref >5.9)
Vitamin B-12: 220 pg/mL (ref 211–911)

## 2019-09-21 LAB — VITAMIN D 25 HYDROXY (VIT D DEFICIENCY, FRACTURES): VITD: 14.17 ng/mL — ABNORMAL LOW (ref 30.00–100.00)

## 2019-09-23 ENCOUNTER — Other Ambulatory Visit: Payer: Self-pay

## 2019-09-23 ENCOUNTER — Encounter: Payer: Self-pay | Admitting: Internal Medicine

## 2019-09-23 ENCOUNTER — Ambulatory Visit (INDEPENDENT_AMBULATORY_CARE_PROVIDER_SITE_OTHER): Payer: Medicare HMO | Admitting: Internal Medicine

## 2019-09-23 VITALS — BP 155/93 | HR 80 | Ht 64.0 in | Wt 276.0 lb

## 2019-09-23 DIAGNOSIS — D649 Anemia, unspecified: Secondary | ICD-10-CM | POA: Insufficient documentation

## 2019-09-23 DIAGNOSIS — R198 Other specified symptoms and signs involving the digestive system and abdomen: Secondary | ICD-10-CM

## 2019-09-23 DIAGNOSIS — F331 Major depressive disorder, recurrent, moderate: Secondary | ICD-10-CM

## 2019-09-23 DIAGNOSIS — R5383 Other fatigue: Secondary | ICD-10-CM | POA: Diagnosis not present

## 2019-09-23 DIAGNOSIS — E538 Deficiency of other specified B group vitamins: Secondary | ICD-10-CM | POA: Diagnosis not present

## 2019-09-23 DIAGNOSIS — G4733 Obstructive sleep apnea (adult) (pediatric): Secondary | ICD-10-CM | POA: Diagnosis not present

## 2019-09-23 DIAGNOSIS — K219 Gastro-esophageal reflux disease without esophagitis: Secondary | ICD-10-CM

## 2019-09-23 DIAGNOSIS — I10 Essential (primary) hypertension: Secondary | ICD-10-CM | POA: Diagnosis not present

## 2019-09-23 DIAGNOSIS — R0602 Shortness of breath: Secondary | ICD-10-CM | POA: Insufficient documentation

## 2019-09-23 DIAGNOSIS — E559 Vitamin D deficiency, unspecified: Secondary | ICD-10-CM | POA: Insufficient documentation

## 2019-09-23 DIAGNOSIS — R69 Illness, unspecified: Secondary | ICD-10-CM | POA: Diagnosis not present

## 2019-09-23 DIAGNOSIS — R739 Hyperglycemia, unspecified: Secondary | ICD-10-CM

## 2019-09-23 DIAGNOSIS — E2839 Other primary ovarian failure: Secondary | ICD-10-CM | POA: Diagnosis not present

## 2019-09-23 MED ORDER — VITAMIN D (ERGOCALCIFEROL) 1.25 MG (50000 UNIT) PO CAPS: 50000.0000 [IU] | ORAL_CAPSULE | ORAL | 0 refills | Status: DC

## 2019-09-23 NOTE — Assessment & Plan Note (Signed)
Found to have slightly decreased hgb recently.  Low B12.  Start B12 injections - 1030mcg q week x 4 weeks and then 1058mcg q month.  Return for iron studies.  Refer to GI for further evaluation given anemia, persistent reflux and bowel change.

## 2019-09-23 NOTE — Assessment & Plan Note (Signed)
Ergocalciferol 50,000 units q week x 3 months and then start vitamin D3 1000 units per day.  Follow.

## 2019-09-23 NOTE — Assessment & Plan Note (Signed)
With increased acid reflux, burning and chest discomfort.  Taking aciphex.  Missing some pm doses.  Will have GI evaluate for question of need for EGD.

## 2019-09-23 NOTE — Assessment & Plan Note (Signed)
Low B12.  Start B12 injections 103mcg q week x 4 weeks and then 1047mcg q month.

## 2019-09-23 NOTE — Assessment & Plan Note (Signed)
Feel is multifactorial.  With nocturia, fatigue, leg swelling, etc, feel untreated sleep apnea contributing.  Has been diagnosed with sleep apnea previously.  Not using cpap.  Will need to arrange for f/u study.  Also with sob with exertion, will have cardiology evaluate.  Also recently found to have low B12.  Start B12 injections.  Also replace vitamin D.  Further w/up for her anemia.

## 2019-09-23 NOTE — Assessment & Plan Note (Signed)
Blood pressure elevated on her checks.  On amlodipine.  Hold on increasing dose given noticed of lower extremity swelling.  Discussed treatment options.  Consider starting chlorthalidone.  Follow metabolic panel.

## 2019-09-23 NOTE — Assessment & Plan Note (Signed)
Was started on prozac last visit.  Will continue.  Follow up with [name].

## 2019-09-23 NOTE — Assessment & Plan Note (Signed)
Described noticing sob with exertion.  May be related to deconditioning and weight gain.  Has a family history of heart disease.  Limited given virtual visit.  Unable to do EKG.  Will refer to cardiology evaluate with question of need for further w/up and evaluation.

## 2019-09-23 NOTE — Progress Notes (Signed)
Patient ID: Jacqueline Delgado, female   DOB: November 21, 1950, 68 y.o.   MRN: [id number]   Virtual Visit via video Note  This visit type was conducted due to national recommendations for restrictions regarding the COVID-19 pandemic (e.g. social distancing).  This format is felt to be most appropriate for this patient at this time.  All issues noted in this document were discussed and addressed.  No physical exam was performed (except for noted visual exam findings with Video Visits).   I connected with Jacqueline Delgado by a video enabled telemedicine application and verified that I am speaking with the correct person using two identifiers. Location patient: home Location provider: work  Persons participating in the virtual visit: patient, provider  I discussed the limitations, risks, security and privacy concerns of performing an evaluation and management service by video and the availability of in person appointments.  The patient expressed understanding and agreed to proceed.   Reason for visit: work in appt  HPI: Was worked in today to discuss lab results.  Recent labs revealed decreased hgb, B12 level and vitamin D level.  Discussed the need to start B12 injections.  Also to start vitamin D.  She was seen recently by Jacqueline Delgado.  Labs ordered - to evaluate fatigue.  Reported some depression then.  Was started on prozac.  Remains on prozac. Still reports increased fatigue.  Also reports not sleeping well.  Nocturia - q 2 hours.  Does not feel rested when wakes up.  Does have a history of sleep apnea.  Not using cpap.  Does not have a machine.  Also reports increased swelling in her feet - over the last few months.  Better in am.  Worse as day progresses. Working from home.  Has gained weight.  Reports wakes up coughing.  Also with nausea. Some burning - she will notice in her chest.  No vomiting.  On aciphex.  Is supposed to take bid.  Sometimes will skip evening dose. Has noticed a change in her stool.   Loose stool now.  Reports sob with exertion.  Noticed walking out to the trash can.  Blood pressure elevated.  States has been averaging [id number] systolic readings.  Has a family history of heart disease.     ROS: See pertinent positives and negatives per HPI.  Past Medical History:  Diagnosis Date  . Anginal pain ([place])     3/8-12/21/10  . Arthritis    Osteoarthritis in BLE knee  . Colon polyp   . GERD (gastroesophageal reflux disease)   . Hiatal hernia   . History of hiatal hernia   . Hypertension    controlled  . Obesity   . Reflux   . Sleep apnea    NO CPAP  . Small vessel disease [facility])     Past Surgical History:  Procedure Laterality Date  . ABDOMINAL HYSTERECTOMY     partial  . CHOLECYSTECTOMY    . COLONOSCOPY W/ POLYPECTOMY     adenomatous colon polyp  . COLONOSCOPY WITH PROPOFOL N/A 12/10/2017   Procedure: COLONOSCOPY WITH PROPOFOL;  Surgeon: Manya Silvas, MD;  Location: [facility] ENDOSCOPY;  Service: Endoscopy;  Laterality: N/A;  . ESOPHAGOGASTRODUODENOSCOPY (EGD) WITH PROPOFOL N/A 10/11/2015   Procedure: ESOPHAGOGASTRODUODENOSCOPY (EGD) WITH PROPOFOL;  Surgeon: Manya Silvas, MD;  Location: [facility] ENDOSCOPY;  Service: Endoscopy;  Laterality: N/A;  . ESOPHAGOGASTRODUODENOSCOPY (EGD) WITH PROPOFOL N/A 12/10/2017   Procedure: ESOPHAGOGASTRODUODENOSCOPY (EGD) WITH PROPOFOL;  Surgeon: Manya Silvas, MD;  Location: [facility] ENDOSCOPY;  Service: Endoscopy;  Laterality: N/A;  . JOINT REPLACEMENT Right    knee, Oct 2012  . JOINT REPLACEMENT     hopefully getting left partial knee replacement in July 2016  . SAVORY DILATION N/A 10/11/2015   Procedure: SAVORY DILATION;  Surgeon: Manya Silvas, MD;  Location: [facility] ENDOSCOPY;  Service: Endoscopy;  Laterality: N/A;  . SHOULDER ARTHROSCOPY WITH ROTATOR CUFF REPAIR AND SUBACROMIAL DECOMPRESSION Right 06/14/2015   Procedure: SHOULDER ARTHROSCOPY WITH mini open ROTATOR CUFF REPAIR AND SUBACROMIAL DECOMPRESSION, release long head biceps  tendon.;  Surgeon: Leanor Kail, MD;  Location: [facility] ORS;  Service: Orthopedics;  Laterality: Right;    Family History  Problem Relation Age of Onset  . Alcoholism Other        brother  . Arthritis Other        parent  . Breast cancer Other        mother and grandmother  . Hyperlipidemia Father   . Heart disease Father   . Stroke Father   . Hypertension Father   . Diabetes Other        parent and other family member  . Breast cancer Mother 33  . Breast cancer Maternal Grandmother        young    SOCIAL HX: reviewed.    Current Outpatient Medications:  .  ACIPHEX 20 MG tablet, TAKE 1 TABLET (20 MG TOTAL) BY MOUTH DAILY. TAKE 1 TABLET TWICE A DAY., Disp: 60 tablet, Rfl: 3 .  amLODipine (NORVASC) 5 MG tablet, amlodipine 5 mg tablet, Disp: , Rfl:  .  FLUoxetine (PROZAC) 20 MG tablet, Take 1 tablet (20 mg total) by mouth daily., Disp: 90 tablet, Rfl: 1 .  meloxicam (MOBIC) 15 MG tablet, Take 15 mg by mouth daily., Disp: , Rfl:  .  traMADol (ULTRAM) 50 MG tablet, TAKE 1 TABLET (50 MG TOTAL) BY MOUTH EVERY 6 (SIX) HOURS AS NEEDED FOR PAIN FOR UP TO 30 DOSES, Disp: , Rfl:  .  Vitamin D, Ergocalciferol, (DRISDOL) 1.25 MG (50000 UT) CAPS capsule, Take 1 capsule (50,000 Units total) by mouth every 7 (seven) days., Disp: 12 capsule, Rfl: 0  EXAM:  GENERAL: alert, oriented, appears well and in no acute distress  HEENT: atraumatic, conjunttiva clear, no obvious abnormalities on inspection of external nose and ears  NECK: normal movements of the head and neck  LUNGS: on inspection no signs of respiratory distress, breathing rate appears normal, no obvious gross SOB, gasping or wheezing  CV: no obvious cyanosis  PSYCH/NEURO: pleasant and cooperative, no obvious depression or anxiety, speech and thought processing grossly intact  ASSESSMENT AND PLAN:  Discussed the following assessment and plan:  Depression, major, recurrent, moderate Was started on prozac last visit.  Will  continue.  Follow up with Jacqueline Delgado.    Essential hypertension Blood pressure elevated on her checks.  On amlodipine.  Hold on increasing dose given noticed of lower extremity swelling.  Discussed treatment options.  Consider starting chlorthalidone.  Follow metabolic panel.   Fatigue Feel is multifactorial.  With nocturia, fatigue, leg swelling, etc, feel untreated sleep apnea contributing.  Has been diagnosed with sleep apnea previously.  Not using cpap.  Will need to arrange for f/u study.  Also with sob with exertion, will have cardiology evaluate.  Also recently found to have low B12.  Start B12 injections.  Also replace vitamin D.  Further w/up for her anemia.    GERD (gastroesophageal reflux disease) With increased acid reflux, burning and chest discomfort.  Taking aciphex.  Missing some pm doses.  Will have GI evaluate for question of need for EGD.    OSA (obstructive sleep apnea) Has been diagnosed previously with sleep apnea.  With nocturia, fatigue, lower extremity swelling and not feeing rested when awakens, needs f/u sleep study and treatment.  Need f/u with pulmonary.    SOB (shortness of breath) on exertion Described noticing sob with exertion.  May be related to deconditioning and weight gain.  Has a family history of heart disease.  Limited given virtual visit.  Unable to do EKG.  Will refer to cardiology evaluate with question of need for further w/up and evaluation.    Anemia Found to have slightly decreased hgb recently.  Low B12.  Start B12 injections - 10109mcg q week x 4 weeks and then 1035mcg q month.  Return for iron studies.  Refer to GI for further evaluation given anemia, persistent reflux and bowel change.   B12 deficiency Low B12.  Start B12 injections 1074mcg q week x 4 weeks and then 1082mcg q month.    Vitamin D deficiency Ergocalciferol 50,000 units q week x 3 months and then start vitamin D3 1000 units per day.  Follow.      I discussed the assessment and  treatment plan with the patient. The patient was provided an opportunity to ask questions and all were answered. The patient agreed with the plan and demonstrated an understanding of the instructions.   The patient was advised to call back or seek an in-person evaluation if the symptoms worsen or if the condition fails to improve as anticipated.   Einar Pheasant, MD

## 2019-09-23 NOTE — Assessment & Plan Note (Signed)
Has been diagnosed previously with sleep apnea.  With nocturia, fatigue, lower extremity swelling and not feeing rested when awakens, needs f/u sleep study and treatment.  Need f/u with pulmonary.

## 2019-09-24 ENCOUNTER — Telehealth: Payer: Self-pay | Admitting: Family

## 2019-09-24 DIAGNOSIS — I10 Essential (primary) hypertension: Secondary | ICD-10-CM

## 2019-09-24 MED ORDER — CYANOCOBALAMIN 1000 MCG/ML IJ SOLN: INTRAMUSCULAR | 15 refills | Status: DC

## 2019-09-24 MED ORDER — HYDROCHLOROTHIAZIDE 12.5 MG PO CAPS: 12.5000 mg | ORAL_CAPSULE | Freq: Every day | ORAL | 0 refills | Status: DC

## 2019-09-24 NOTE — Telephone Encounter (Signed)
  Call pt She spoke with dr Nicki Reaper and we wanted to follow up  b12 ordered as she discussed with dr Nicki Reaper  Start low dose hctz for blood pressure; she needs to check BMP one week after starting to ensure potassium normal.   I have replaced pulmonary referral for eval of sleep apnea   Please make a f/u with me in one week to discuss BP

## 2019-09-24 NOTE — Telephone Encounter (Signed)
I spoke with patient & she will start HCTZ. I have also scheduled her for repeat lab next Friday along with virtual appointment same day @ 12p.

## 2019-09-27 NOTE — Telephone Encounter (Signed)
Okay to get b12 set from office Ensure patient starts bp log ahead of our appt on 12/18.  Does she have an appt with pulmonology or GI yet? Ask her to ensure she calls [place] if she doesn't hear about those in the next few days

## 2019-09-27 NOTE — Telephone Encounter (Signed)
Would you mind giving a courtesy call and just seeing how she is feeling for the headache, and her blood pressure.  Let me know

## 2019-09-27 NOTE — Telephone Encounter (Signed)
I called and patient is set up for first two B-12 injections. She will bring medication from pharmacy that was picked up. I also asked patient to keep BP log & patient was agreeable to do so for appointment Friday.

## 2019-09-29 ENCOUNTER — Ambulatory Visit (INDEPENDENT_AMBULATORY_CARE_PROVIDER_SITE_OTHER): Payer: Medicare HMO

## 2019-09-29 ENCOUNTER — Other Ambulatory Visit: Payer: Self-pay

## 2019-09-29 ENCOUNTER — Other Ambulatory Visit (INDEPENDENT_AMBULATORY_CARE_PROVIDER_SITE_OTHER): Payer: Medicare HMO

## 2019-09-29 DIAGNOSIS — R739 Hyperglycemia, unspecified: Secondary | ICD-10-CM | POA: Diagnosis not present

## 2019-09-29 DIAGNOSIS — D649 Anemia, unspecified: Secondary | ICD-10-CM

## 2019-09-29 DIAGNOSIS — E538 Deficiency of other specified B group vitamins: Secondary | ICD-10-CM

## 2019-09-29 DIAGNOSIS — I10 Essential (primary) hypertension: Secondary | ICD-10-CM | POA: Diagnosis not present

## 2019-09-29 MED ORDER — CYANOCOBALAMIN 1000 MCG/ML IJ SOLN
1000.0000 ug | Freq: Once | INTRAMUSCULAR | Status: AC
  Administered 2019-09-29: 16:00:00 1000 ug via INTRAMUSCULAR

## 2019-09-29 NOTE — Progress Notes (Addendum)
Pt came in for first b12 injection today. Given in L arm. Tolerated well. No complaints or concerns at this time. Will return next week.   Agree with plan. Mable Paris, NP

## 2019-09-29 NOTE — Addendum Note (Signed)
Addended by: Leeanne Rio on: 09/29/2019 03:12 PM   Modules accepted: Orders

## 2019-09-30 ENCOUNTER — Encounter: Payer: Self-pay | Admitting: Family

## 2019-09-30 LAB — BASIC METABOLIC PANEL
BUN/Creatinine Ratio: 19 (ref 12–28)
BUN: 17 mg/dL (ref 8–27)
CO2: 24 mmol/L (ref 20–29)
Calcium: 9.1 mg/dL (ref 8.7–10.3)
Chloride: 103 mmol/L (ref 96–106)
Creatinine, Ser: 0.91 mg/dL (ref 0.57–1.00)
GFR calc Af Amer: 76 mL/min/{1.73_m2} (ref >59)
GFR calc non Af Amer: 65 mL/min/{1.73_m2} (ref >59)
Glucose: 99 mg/dL (ref 65–99)
Potassium: 3.6 mmol/L (ref 3.5–5.2)
Sodium: 141 mmol/L (ref 134–144)

## 2019-09-30 LAB — HEMOGLOBIN A1C
Est. average glucose Bld gHb Est-mCnc: 111 mg/dL
Hgb A1c MFr Bld: 5.5 % (ref 4.8–5.6)

## 2019-09-30 LAB — IRON,TIBC AND FERRITIN PANEL
Ferritin: 24 ng/mL (ref 15–150)
Iron Saturation: 11 % — ABNORMAL LOW (ref 15–55)
Iron: 37 ug/dL (ref 27–139)
Total Iron Binding Capacity: 351 ug/dL (ref 250–450)
UIBC: 314 ug/dL (ref 118–369)

## 2019-10-01 ENCOUNTER — Encounter: Payer: Self-pay | Admitting: Family

## 2019-10-01 ENCOUNTER — Other Ambulatory Visit: Payer: Self-pay

## 2019-10-01 ENCOUNTER — Other Ambulatory Visit: Payer: Medicare HMO

## 2019-10-01 ENCOUNTER — Ambulatory Visit (INDEPENDENT_AMBULATORY_CARE_PROVIDER_SITE_OTHER): Payer: Medicare HMO | Admitting: Family

## 2019-10-01 VITALS — BP 143/81 | Ht 64.0 in | Wt 276.0 lb

## 2019-10-01 DIAGNOSIS — D649 Anemia, unspecified: Secondary | ICD-10-CM

## 2019-10-01 DIAGNOSIS — K219 Gastro-esophageal reflux disease without esophagitis: Secondary | ICD-10-CM | POA: Diagnosis not present

## 2019-10-01 DIAGNOSIS — G4733 Obstructive sleep apnea (adult) (pediatric): Secondary | ICD-10-CM

## 2019-10-01 DIAGNOSIS — N898 Other specified noninflammatory disorders of vagina: Secondary | ICD-10-CM | POA: Insufficient documentation

## 2019-10-01 DIAGNOSIS — I10 Essential (primary) hypertension: Secondary | ICD-10-CM

## 2019-10-01 DIAGNOSIS — R8290 Unspecified abnormal findings in urine: Secondary | ICD-10-CM | POA: Diagnosis not present

## 2019-10-01 NOTE — Progress Notes (Signed)
Mailed

## 2019-10-01 NOTE — Assessment & Plan Note (Signed)
Awaiting appt with pulmonology ; will follow

## 2019-10-01 NOTE — Progress Notes (Addendum)
Virtual Visit via Video Note  I connected with@  on 10/01/19 at 12:00 PM EST by a video enabled telemedicine application and verified that I am speaking with the correct person using two identifiers.  Location patient: home Location provider:home office Persons participating in the virtual visit: patient, provider  I discussed the limitations of evaluation and management by telemedicine and the availability of in person appointments. The patient expressed understanding and agreed to proceed.   HPI:  Complains of vaginal discharge with strong odor x 3 months, unchanged. Has been seeing brownish color in underwear. Nocturia has improved.  No vaginal itching. No new sexual partners. Not sexually active. No fever, dysuria, vaginal bleeding, blood in urine, constipation, hemorrhoids..   h/o hysterectomy  HTN- only on HCTZ; had stopped amlodipine. No nausea, palpitations, SOB, left arm pain/numbness nor jaw pain.  Many years ago had stress test normal per patient.  NO exercise.  Seeing Dr Saunders Revel 10/25/19.  GERD- follows with GI. Will feel epigastric burning when laying down still at night.Improves with aciphex.  NO heaviness on chest.   Feels better with b12 injections   GI referral - appt scheduled 10/2019.  Has been called by pulmonology   ROS: See pertinent positives and negatives per HPI.  Past Medical History:  Diagnosis Date  . Anginal pain ([place])     3/8-12/21/10  . Arthritis    Osteoarthritis in BLE knee  . Colon polyp   . GERD (gastroesophageal reflux disease)   . Hiatal hernia   . History of hiatal hernia   . Hypertension    controlled  . Obesity   . Reflux   . Sleep apnea    NO CPAP  . Small vessel disease [facility])     Past Surgical History:  Procedure Laterality Date  . ABDOMINAL HYSTERECTOMY     partial; per patient has left ovary  . CHOLECYSTECTOMY    . COLONOSCOPY W/ POLYPECTOMY     adenomatous colon polyp  . COLONOSCOPY WITH PROPOFOL N/A 12/10/2017   Procedure:  COLONOSCOPY WITH PROPOFOL;  Surgeon: Manya Silvas, MD;  Location: [facility] ENDOSCOPY;  Service: Endoscopy;  Laterality: N/A;  . ESOPHAGOGASTRODUODENOSCOPY (EGD) WITH PROPOFOL N/A 10/11/2015   Procedure: ESOPHAGOGASTRODUODENOSCOPY (EGD) WITH PROPOFOL;  Surgeon: Manya Silvas, MD;  Location: [facility] ENDOSCOPY;  Service: Endoscopy;  Laterality: N/A;  . ESOPHAGOGASTRODUODENOSCOPY (EGD) WITH PROPOFOL N/A 12/10/2017   Procedure: ESOPHAGOGASTRODUODENOSCOPY (EGD) WITH PROPOFOL;  Surgeon: Manya Silvas, MD;  Location: [facility] ENDOSCOPY;  Service: Endoscopy;  Laterality: N/A;  . JOINT REPLACEMENT Right    knee, Oct 2012  . JOINT REPLACEMENT     hopefully getting left partial knee replacement in July 2016  . SAVORY DILATION N/A 10/11/2015   Procedure: SAVORY DILATION;  Surgeon: Manya Silvas, MD;  Location: [facility] ENDOSCOPY;  Service: Endoscopy;  Laterality: N/A;  . SHOULDER ARTHROSCOPY WITH ROTATOR CUFF REPAIR AND SUBACROMIAL DECOMPRESSION Right 06/14/2015   Procedure: SHOULDER ARTHROSCOPY WITH mini open ROTATOR CUFF REPAIR AND SUBACROMIAL DECOMPRESSION, release long head biceps tendon.;  Surgeon: Leanor Kail, MD;  Location: [facility] ORS;  Service: Orthopedics;  Laterality: Right;    Family History  Problem Relation Age of Onset  . Alcoholism Other        brother  . Arthritis Other        parent  . Breast cancer Other        mother and grandmother  . Hyperlipidemia Father   . Heart disease Father   . Stroke Father   .  Hypertension Father   . Diabetes Other        parent and other family member  . Breast cancer Mother 20  . Breast cancer Maternal Grandmother        young    SOCIAL HX: never smoker   Current Outpatient Medications:  .  ACIPHEX 20 MG tablet, TAKE 1 TABLET (20 MG TOTAL) BY MOUTH DAILY. TAKE 1 TABLET TWICE A DAY., Disp: 60 tablet, Rfl: 3 .  cyanocobalamin (,VITAMIN B-12,) 1000 MCG/ML injection, 1000 mcg (1 mg) injection once per week for four weeks, followed by 1000 mcg  injection once per month., Disp: 1 mL, Rfl: 15 .  FLUoxetine (PROZAC) 20 MG tablet, Take 1 tablet (20 mg total) by mouth daily., Disp: 90 tablet, Rfl: 1 .  hydrochlorothiazide (MICROZIDE) 12.5 MG capsule, Take 1 capsule (12.5 mg total) by mouth daily., Disp: 90 capsule, Rfl: 0 .  meloxicam (MOBIC) 15 MG tablet, Take 15 mg by mouth daily., Disp: , Rfl:  .  traMADol (ULTRAM) 50 MG tablet, TAKE 1 TABLET (50 MG TOTAL) BY MOUTH EVERY 6 (SIX) HOURS AS NEEDED FOR PAIN FOR UP TO 30 DOSES, Disp: , Rfl:  .  Vitamin D, Ergocalciferol, (DRISDOL) 1.25 MG (50000 UT) CAPS capsule, Take 1 capsule (50,000 Units total) by mouth every 7 (seven) days., Disp: 12 capsule, Rfl: 0 .  amLODipine (NORVASC) 5 MG tablet, amlodipine 5 mg tablet, Disp: , Rfl:   EXAM:  VITALS per patient if applicable: Vitals:   0000000 1157  BP: (!) 143/81     GENERAL: alert, oriented, appears well and in no acute distress  HEENT: atraumatic, conjunttiva clear, no obvious abnormalities on inspection of external nose and ears  NECK: normal movements of the head and neck  LUNGS: on inspection no signs of respiratory distress, breathing rate appears normal, no obvious gross SOB, gasping or wheezing  CV: no obvious cyanosis When describing chest pain, patient points to midline sternum and up towards her neck.  MS: moves all visible extremities without noticeable abnormality  PSYCH/NEURO: pleasant and cooperative, no obvious depression or anxiety, speech and thought processing grossly intact  ASSESSMENT AND PLAN:  Discussed the following assessment and plan:  Abnormal urine - Plan: 3 day take home stool cards- Fecal occult blood, imunochemical, Urine Culture, Urinalysis, Routine w reflex microscopic- SEND OUT, Urine cytology ancillary only([place])  Essential hypertension  OSA (obstructive sleep apnea)  Anemia, unspecified type  Gastroesophageal reflux disease, unspecified whether esophagitis present Problem List  Items Addressed This Visit      Cardiovascular and Mediastinum   Essential hypertension    Not at goal; patient didn't understand to take amlodipine. She will take this starting today and understands goal of < 120/80. Close f/u in one week        Respiratory   OSA (obstructive sleep apnea)    Awaiting appt with pulmonology ; will follow        Digestive   GERD (gastroesophageal reflux disease)    Pending GI referral however re-emphasized vigilance and cardiac chest pain symptoms as the two can overlap, and patient understands to go to emergency room if symptoms were to appear cardiac.  She has upcoming appointment with cardiology, Dr. Saunders Revel however working on getting to schedule earlier  Addendum, of note; patient offered an appointment by Micheline Maze for today, 10/01/2019 at 340 this afternoon, she politely declines today and wants to keep appointment January 11 with Dr. Saunders Revel        Other  Anemia    Has GI appt  10/2019. She has started b12. Pending stool cards.       Abnormal urine - Primary    Etiology nonspecific at this time; unsure if discharge from vagina or urethra. Pending labs. Close follow up.       Relevant Orders   3 day take home stool cards- Fecal occult blood, imunochemical   Urine Culture   Urinalysis, Routine w reflex microscopic- SEND OUT   Urine cytology ancillary only()      -we discussed possible serious and likely etiologies, options for evaluation and workup, limitations of telemedicine visit vs in person visit, treatment, treatment risks and precautions. Pt prefers to treat via telemedicine empirically rather then risking or undertaking an in person visit at this moment. Patient agrees to seek prompt in person care if worsening, new symptoms arise, or if is not improving with treatment.   I discussed the assessment and treatment plan with the patient. The patient was provided an opportunity to ask questions and all were answered. The patient  agreed with the plan and demonstrated an understanding of the instructions.   The patient was advised to call back or seek an in-person evaluation if the symptoms worsen or if the condition fails to improve as anticipated.   Mable Paris, FNP

## 2019-10-01 NOTE — Assessment & Plan Note (Signed)
Etiology nonspecific at this time; unsure if discharge from vagina or urethra. Pending labs. Close follow up.

## 2019-10-01 NOTE — Assessment & Plan Note (Signed)
Has GI appt  10/2019. She has started b12. Pending stool cards.

## 2019-10-01 NOTE — Patient Instructions (Addendum)
Please stay very vigilant in regard to burning sensation in your chest. Please read the below extensively and please report to emergency room if symptom were to change in any way or new symptoms develop as we discussed today.  Urine and stool studies ; we will call you to schedule  START amlodipine and stay on HCTZ.   Monitor blood pressure,  Goal is less than 120/80, based on newest guidelines; if persistently higher, please make sooner follow up appointment so we can recheck you blood pressure and manage medications  Follow up in one week; please ensure you are called promptly to make this appointment  Angina  Angina is extreme discomfort in the chest, neck, arm, jaw, or back. The discomfort is caused by a lack of blood in the middle layer of the heart wall (myocardium). There are four types of angina:  Stable angina. This is triggered by vigorous activity or exercise. It goes away when you rest or take angina medicine.  Unstable angina. This is a warning sign and can lead to a heart attack (acute coronary syndrome). This is a medical emergency. Symptoms come at rest and last a long time.  Microvascular angina. This affects the small coronary arteries. Symptoms include feeling tired and being short of breath.  Prinzmetal or variant angina. This is caused by a tightening (spasm) of the arteries that go to your heart. What are the causes? This condition is caused by atherosclerosis. This is the buildup of fat and cholesterol (plaque) in your arteries. The plaque may narrow or block the artery. Other causes of angina include:  Sudden tightening of the muscles of the arteries in the heart (coronary spasm).  Small artery disease (microvascular dysfunction).  Problems with any of your heart valves (heart valve disease).  A tear in an artery in your heart (coronary artery dissection).  Diseases of the heart muscle (cardiomyopathy), or other heart diseases. What increases the risk? You  are more likely to develop this condition if you have:  High cholesterol.  High blood pressure (hypertension).  Diabetes.  A family history of heart disease.  An inactive (sedentary) lifestyle, or you do not exercise enough.  Depression.  Had radiation treatment to the left side of your chest. Other risk factors include:  Using tobacco.  Being obese.  Eating a diet high in saturated fats.  Being exposed to high stress or triggers of stress.  Using drugs, such as cocaine. Women have a greater risk for angina if:  They are older than 34.  They have gone through menopause (are postmenopausal). What are the signs or symptoms? Common symptoms of this condition in both men and women may include:  Chest pain, which may: ? Feel like a crushing or squeezing in the chest, or like a tightness, pressure, fullness, or heaviness in the chest. ? Last for more than a few minutes at a time, or it may stop and come back (recur) over the course of a few minutes.  Pain in the neck, arm, jaw, or back.  Unexplained heartburn or indigestion.  Shortness of breath.  Nausea.  Sudden cold sweats. Women and people with diabetes may have unusual (atypical) symptoms, such as:  Fatigue.  Unexplained feelings of nervousness or anxiety.  Unexplained weakness.  Dizziness or fainting. How is this diagnosed? This condition may be diagnosed based on:  Your symptoms and medical history.  Electrocardiogram (ECG) to measure the electrical activity in your heart.  Blood tests.  Stress test to look for signs  of blockage when your heart is stressed.  CT angiogram to examine your heart and the blood flow to it.  Coronary angiogram to check your coronary arteries for blockage. How is this treated? Angina may be treated with:  Medicines to: ? Prevent blood clots and heart attack. ? Relax blood vessels and improve blood flow to the heart (nitrates). ? Reduce blood pressure, improve the  pumping action of the heart, and relax blood vessels that are spasming. ? Reduce cholesterol and help treat atherosclerosis.  A procedure to widen a narrowed or blocked coronary artery (angioplasty). A mesh tube may be placed in a coronary artery to keep it open (coronary stenting).  Surgery to allow blood to go around a blocked artery (coronary artery bypass surgery). Follow these instructions at home: Medicines  Take over-the-counter and prescription medicines only as told by your health care provider.  Do not take the following medicines unless your health care provider approves: ? NSAIDs, such as ibuprofen or naproxen. ? Vitamin supplements that contain vitamin A, vitamin E, or both. ? Hormone replacement therapy that contains estrogen with or without progestin. Eating and drinking   Eat a heart-healthy diet. This includes plenty of fresh fruits and vegetables, whole grains, low-fat (lean) protein, and low-fat dairy products.  Follow instructions from your health care provider about eating or drinking restrictions. Activity  Follow an exercise program approved by your health care provider.  Consider joining a cardiac rehabilitation program.  Take a break when you feel fatigued. Plan rest periods in your daily activities. Lifestyle   Do not use any products that contain nicotine or tobacco, such as cigarettes, e-cigarettes, and chewing tobacco. If you need help quitting, ask your health care provider.  If your health care provider says you can drink alcohol: ? Limit how much you use to:  0-1 drink a day for nonpregnant women.  0-2 drinks a day for men. ? Be aware of how much alcohol is in your drink. In the [place] one drink equals one 12 oz bottle of beer (355 mL), one 5 oz glass of wine (148 mL), or one 1 oz glass of hard liquor (44 mL). General instructions  Maintain a healthy weight.  Learn to manage stress.  Keep your vaccinations up to date. Get the flu  (influenza) vaccine every year.  Talk to your health care provider if you feel depressed. Take a depression screening test to see if you are at risk for depression.  Work with your health care provider to manage other health conditions, such as hypertension or diabetes.  Keep all follow-up visits as told by your health care provider. This is important. Get help right away if:  You have pain in your chest, neck, arm, jaw, or back, and the pain: ? Lasts more than a few minutes. ? Is recurring. ? Is not relieved by taking medicines under the tongue (sublingual nitroglycerin). ? Increases in intensity or frequency.  You have a lot of sweating without cause.  You have unexplained: ? Heartburn or indigestion. ? Shortness of breath or difficulty breathing. ? Nausea or vomiting. ? Fatigue. ? Feelings of nervousness or anxiety. ? Weakness.  You have sudden light-headedness or dizziness.  You faint. These symptoms may represent a serious problem that is an emergency. Do not wait to see if the symptoms will go away. Get medical help right away. Call your local emergency services (911 in the [place] Do not drive yourself to the hospital. Summary  Angina is extreme discomfort  in the chest, neck, arm, jaw, or back that is caused by a lack of blood in the heart wall.  There are many symptoms of angina. They include chest pain, unexplained heartburn or indigestion, sudden cold sweats, and fatigue.  Angina may be treated with behavioral changes, medicine, or surgery.  Symptoms of angina may represent an emergency. Get medical help right away. Call your local emergency services (911 in the [place] Do not drive yourself to the hospital. This information is not intended to replace advice given to you by your health care provider. Make sure you discuss any questions you have with your health care provider. Document Released: 09/30/2005 Document Revised: 05/18/2018 Document Reviewed:  05/18/2018 Elsevier Patient Education  2020 [employer].

## 2019-10-01 NOTE — Assessment & Plan Note (Signed)
Not at goal; patient didn't understand to take amlodipine. She will take this starting today and understands goal of < 120/80. Close f/u in one week

## 2019-10-01 NOTE — Progress Notes (Signed)
Patient already scheduled by front office.

## 2019-10-01 NOTE — Assessment & Plan Note (Addendum)
Pending GI referral however re-emphasized vigilance and cardiac chest pain symptoms as the two can overlap, and patient understands to go to emergency room if symptoms were to appear cardiac.  She has upcoming appointment with cardiology, Dr. Saunders Revel however working on getting to schedule earlier  Addendum, of note; patient offered an appointment by Micheline Maze for today, 10/01/2019 at 340 this afternoon, she politely declines today and wants to keep appointment January 11 with Dr. Saunders Revel

## 2019-10-06 ENCOUNTER — Other Ambulatory Visit: Payer: Medicare HMO

## 2019-10-06 ENCOUNTER — Other Ambulatory Visit: Payer: Self-pay

## 2019-10-06 ENCOUNTER — Ambulatory Visit (INDEPENDENT_AMBULATORY_CARE_PROVIDER_SITE_OTHER): Payer: Medicare HMO

## 2019-10-06 DIAGNOSIS — E538 Deficiency of other specified B group vitamins: Secondary | ICD-10-CM

## 2019-10-06 DIAGNOSIS — R8290 Unspecified abnormal findings in urine: Secondary | ICD-10-CM | POA: Diagnosis not present

## 2019-10-06 MED ORDER — CYANOCOBALAMIN 1000 MCG/ML IJ SOLN
1000.0000 ug | Freq: Once | INTRAMUSCULAR | Status: AC
  Administered 2019-10-06: 17:00:00 1000 ug via INTRAMUSCULAR

## 2019-10-06 NOTE — Progress Notes (Addendum)
Patient came in for b12 injection. Given in R deltoid. Tolerated well. NO complaints or concerns at this time.   Noted. Mable Paris, NP

## 2019-10-07 ENCOUNTER — Telehealth: Payer: Self-pay

## 2019-10-07 ENCOUNTER — Other Ambulatory Visit: Payer: Self-pay

## 2019-10-07 LAB — URINALYSIS, ROUTINE W REFLEX MICROSCOPIC
Bilirubin Urine: NEGATIVE
Glucose, UA: NEGATIVE
Hgb urine dipstick: NEGATIVE
Hyaline Cast: NONE SEEN /LPF
Ketones, ur: NEGATIVE
Nitrite: POSITIVE — AB
Protein, ur: NEGATIVE
Specific Gravity, Urine: 1.022 (ref 1.001–1.03)
pH: 6 (ref 5.0–8.0)

## 2019-10-07 MED ORDER — AMOXICILLIN-POT CLAVULANATE 875-125 MG PO TABS: ORAL_TABLET | ORAL | 0 refills | Status: DC

## 2019-10-07 NOTE — Telephone Encounter (Signed)
After seeing results that patient had UTI per Keturah Shavers, FNP & called patient to see if she was having any UTI symptoms. She stated that she as not having her typical UTI symptoms with the burning & irritation.She stated that she did feel like at night when she laid down that she was having a discharge & that she does have to urinate frequently approximately every 2 hours or so. I letr patient know that UA was indicative of the UTI & since she was having the urine frequency per [name] is sent patient in Augmentin to start over the holiday until we get urine culture back. I advised that she get a probiotic in case of stomach upset with an antibiotic. She said that she as already having some difficulty with mushy stools & she was having to do stool cards. I again advised the need for probiotic. I told her we would follow-up when we receive urine culture back. I have discussed with [name] on the phone & will follow with patient.

## 2019-10-08 LAB — URINE CULTURE
MICRO NUMBER:: 1226807
SPECIMEN QUALITY:: ADEQUATE

## 2019-10-11 ENCOUNTER — Ambulatory Visit (INDEPENDENT_AMBULATORY_CARE_PROVIDER_SITE_OTHER): Payer: Medicare HMO | Admitting: Family

## 2019-10-11 ENCOUNTER — Encounter: Payer: Self-pay | Admitting: Family

## 2019-10-11 VITALS — BP 154/92 | Ht 64.0 in | Wt 276.0 lb

## 2019-10-11 DIAGNOSIS — I10 Essential (primary) hypertension: Secondary | ICD-10-CM | POA: Diagnosis not present

## 2019-10-11 DIAGNOSIS — R8290 Unspecified abnormal findings in urine: Secondary | ICD-10-CM

## 2019-10-11 MED ORDER — LOSARTAN POTASSIUM 50 MG PO TABS: 50.0000 mg | ORAL_TABLET | Freq: Every evening | ORAL | 3 refills | Status: DC

## 2019-10-11 NOTE — Progress Notes (Signed)
Virtual Visit via Video Note  I connected with@  on 10/11/19 at 10:30 AM EST by a video enabled telemedicine application and verified that I am speaking with the correct person using two identifiers.  Location patient: home Location provider:work or home office Persons participating in the virtual visit: patient, provider  I discussed the limitations of evaluation and management by telemedicine and the availability of in person appointments. The patient expressed understanding and agreed to proceed.   HPI:  Follow-up blood pressure  HTN- compliant with medication however checking daily and still elevated. No episodes of CP.  Has noticed that she does get a little swelling in her legs from amlodipine.  Urinary frequency , foul smell has improved on Augmentin.  Loose stools,resolved.  Plans to get probiotics.   Planning to start iron as well.   ROS: See pertinent positives and negatives per HPI.  Past Medical History:  Diagnosis Date  . Anginal pain ([place])     3/8-12/21/10  . Arthritis    Osteoarthritis in BLE knee  . Colon polyp   . GERD (gastroesophageal reflux disease)   . Hiatal hernia   . History of hiatal hernia   . Hypertension    controlled  . Obesity   . Reflux   . Sleep apnea    NO CPAP  . Small vessel disease [facility])     Past Surgical History:  Procedure Laterality Date  . ABDOMINAL HYSTERECTOMY     partial; per patient has left ovary  . CHOLECYSTECTOMY    . COLONOSCOPY W/ POLYPECTOMY     adenomatous colon polyp  . COLONOSCOPY WITH PROPOFOL N/A 12/10/2017   Procedure: COLONOSCOPY WITH PROPOFOL;  Surgeon: Manya Silvas, MD;  Location: [facility] ENDOSCOPY;  Service: Endoscopy;  Laterality: N/A;  . ESOPHAGOGASTRODUODENOSCOPY (EGD) WITH PROPOFOL N/A 10/11/2015   Procedure: ESOPHAGOGASTRODUODENOSCOPY (EGD) WITH PROPOFOL;  Surgeon: Manya Silvas, MD;  Location[facility] ENDOSCOPY;  Service: Endoscopy;  Laterality: N/A;  . ESOPHAGOGASTRODUODENOSCOPY (EGD) WITH  PROPOFOL N/A 12/10/2017   Procedure: ESOPHAGOGASTRODUODENOSCOPY (EGD) WITH PROPOFOL;  Surgeon: Manya Silvas, MD;  Location[facility] ENDOSCOPY;  Service: Endoscopy;  Laterality: N/A;  . JOINT REPLACEMENT Right    knee, Oct 2012  . JOINT REPLACEMENT     hopefully getting left partial knee replacement in July 2016  . SAVORY DILATION N/A 10/11/2015   Procedure: SAVORY DILATION;  Surgeon: Manya Silvas, MD;  Location: [facility] ENDOSCOPY;  Service: Endoscopy;  Laterality: N/A;  . SHOULDER ARTHROSCOPY WITH ROTATOR CUFF REPAIR AND SUBACROMIAL DECOMPRESSION Right 06/14/2015   Procedure: SHOULDER ARTHROSCOPY WITH mini open ROTATOR CUFF REPAIR AND SUBACROMIAL DECOMPRESSION, release long head biceps tendon.;  Surgeon: Leanor Kail, MD;  Location: [facility] ORS;  Service: Orthopedics;  Laterality: Right;    Family History  Problem Relation Age of Onset  . Alcoholism Other        brother  . Arthritis Other        parent  . Breast cancer Other        mother and grandmother  . Hyperlipidemia Father   . Heart disease Father   . Stroke Father   . Hypertension Father   . Diabetes Other        parent and other family member  . Breast cancer Mother 57  . Breast cancer Maternal Grandmother        young    SOCIAL HX: never smoker   Current Outpatient Medications:  .  ACIPHEX 20 MG tablet, TAKE 1 TABLET (20 MG TOTAL)  BY MOUTH DAILY. TAKE 1 TABLET TWICE A DAY., Disp: 60 tablet, Rfl: 3 .  amLODipine (NORVASC) 5 MG tablet, amlodipine 5 mg tablet, Disp: , Rfl:  .  amoxicillin-clavulanate (AUGMENTIN) 875-125 MG tablet, Take by mouth every 12 hours for 5 days., Disp: 10 tablet, Rfl: 0 .  cyanocobalamin (,VITAMIN B-12,) 1000 MCG/ML injection, 1000 mcg (1 mg) injection once per week for four weeks, followed by 1000 mcg injection once per month., Disp: 1 mL, Rfl: 15 .  FLUoxetine (PROZAC) 20 MG tablet, Take 1 tablet (20 mg total) by mouth daily., Disp: 90 tablet, Rfl: 1 .  hydrochlorothiazide (MICROZIDE) 12.5 MG  capsule, Take 1 capsule (12.5 mg total) by mouth daily., Disp: 90 capsule, Rfl: 0 .  meloxicam (MOBIC) 15 MG tablet, Take 15 mg by mouth daily., Disp: , Rfl:  .  traMADol (ULTRAM) 50 MG tablet, TAKE 1 TABLET (50 MG TOTAL) BY MOUTH EVERY 6 (SIX) HOURS AS NEEDED FOR PAIN FOR UP TO 30 DOSES, Disp: , Rfl:  .  Vitamin D, Ergocalciferol, (DRISDOL) 1.25 MG (50000 UT) CAPS capsule, Take 1 capsule (50,000 Units total) by mouth every 7 (seven) days., Disp: 12 capsule, Rfl: 0 .  losartan (COZAAR) 50 MG tablet, Take 1 tablet (50 mg total) by mouth every evening., Disp: 90 tablet, Rfl: 3  EXAM:  VITALS per patient if applicable: BP Readings from Last 3 Encounters:  10/11/19 (!) 154/92  10/01/19 (!) 143/81  09/23/19 (!) 155/93    GENERAL: alert, oriented, appears well and in no acute distress  HEENT: atraumatic, conjunttiva clear, no obvious abnormalities on inspection of external nose and ears  NECK: normal movements of the head and neck  LUNGS: on inspection no signs of respiratory distress, breathing rate appears normal, no obvious gross SOB, gasping or wheezing  CV: no obvious cyanosis  MS: moves all visible extremities without noticeable abnormality  PSYCH/NEURO: pleasant and cooperative, no obvious depression or anxiety, speech and thought processing grossly intact  ASSESSMENT AND PLAN:  Discussed the following assessment and plan:  Essential hypertension - Plan: losartan (COZAAR) 50 MG tablet, Basic metabolic panel  Abnormal urine Problem List Items Addressed This Visit      Cardiovascular and Mediastinum   Essential hypertension - Primary    Not at goal. Start losartan 50mg  and titrate.  We discussed weaning off with the goal of discontinuing amlodipine as patient does not like the swelling.  We opted not to start this process right now since blood pressure is uncontrolled.repeat bmp 1 week. Close follow up.      Relevant Medications   losartan (COZAAR) 50 MG tablet   Other  Relevant Orders   Basic metabolic panel     Other   Abnormal urine    Urinary symptoms resolving on Augmentin.  Advised her to complete antibiotics and to start probiotics.  She will let me know of any further concerns.         -we discussed possible serious and likely etiologies, options for evaluation and workup, limitations of telemedicine visit vs in person visit, treatment, treatment risks and precautions. Pt prefers to treat via telemedicine empirically rather then risking or undertaking an in person visit at this moment. Patient agrees to seek prompt in person care if worsening, new symptoms arise, or if is not improving with treatment.   I discussed the assessment and treatment plan with the patient. The patient was provided an opportunity to ask questions and all were answered. The patient agreed with the plan  and demonstrated an understanding of the instructions.   The patient was advised to call back or seek an in-person evaluation if the symptoms worsen or if the condition fails to improve as anticipated.   Mable Paris, FNP

## 2019-10-11 NOTE — Assessment & Plan Note (Addendum)
Not at goal. Start losartan 50mg  and titrate.  We discussed weaning off with the goal of discontinuing amlodipine as patient does not like the swelling.  We opted not to start this process right now since blood pressure is uncontrolled.repeat bmp 1 week. Close follow up.

## 2019-10-11 NOTE — Assessment & Plan Note (Signed)
Urinary symptoms resolving on Augmentin.  Advised her to complete antibiotics and to start probiotics.  She will let me know of any further concerns.

## 2019-10-12 ENCOUNTER — Encounter: Payer: Self-pay | Admitting: Family

## 2019-10-13 ENCOUNTER — Ambulatory Visit (INDEPENDENT_AMBULATORY_CARE_PROVIDER_SITE_OTHER): Payer: Medicare HMO | Admitting: *Deleted

## 2019-10-13 ENCOUNTER — Other Ambulatory Visit: Payer: Self-pay

## 2019-10-13 DIAGNOSIS — E538 Deficiency of other specified B group vitamins: Secondary | ICD-10-CM

## 2019-10-13 MED ORDER — CYANOCOBALAMIN 1000 MCG/ML IJ SOLN
1000.0000 ug | Freq: Once | INTRAMUSCULAR | Status: AC
  Administered 2019-10-13: 1000 ug via INTRAMUSCULAR

## 2019-10-13 NOTE — Progress Notes (Addendum)
Patient presented for B 12 injection to left deltoid, patient voiced no concerns nor showed any signs of distress during injection  Agree with plan. Mable Paris, NP

## 2019-10-16 ENCOUNTER — Emergency Department
Admission: EM | Admit: 2019-10-16 | Discharge: 2019-10-16 | Disposition: A | Discharge status: Home / Self Care | Payer: Medicare HMO | Attending: Emergency Medicine | Admitting: Emergency Medicine

## 2019-10-16 ENCOUNTER — Other Ambulatory Visit: Payer: Self-pay

## 2019-10-16 ENCOUNTER — Emergency Department: Payer: Medicare HMO

## 2019-10-16 DIAGNOSIS — I10 Essential (primary) hypertension: Secondary | ICD-10-CM | POA: Insufficient documentation

## 2019-10-16 DIAGNOSIS — Z79899 Other long term (current) drug therapy: Secondary | ICD-10-CM | POA: Insufficient documentation

## 2019-10-16 DIAGNOSIS — B349 Viral infection, unspecified: Secondary | ICD-10-CM | POA: Diagnosis not present

## 2019-10-16 DIAGNOSIS — N6311 Unspecified lump in the right breast, upper outer quadrant: Secondary | ICD-10-CM | POA: Diagnosis not present

## 2019-10-16 DIAGNOSIS — N6310 Unspecified lump in the right breast, unspecified quadrant: Secondary | ICD-10-CM | POA: Insufficient documentation

## 2019-10-16 DIAGNOSIS — R05 Cough: Secondary | ICD-10-CM | POA: Diagnosis not present

## 2019-10-16 DIAGNOSIS — Z20822 Contact with and (suspected) exposure to covid-19: Secondary | ICD-10-CM | POA: Insufficient documentation

## 2019-10-16 LAB — CBC WITH DIFFERENTIAL/PLATELET
Abs Immature Granulocytes: 0.02 10*3/uL (ref 0.00–0.07)
Basophils Absolute: 0 10*3/uL (ref 0.0–0.1)
Basophils Relative: 1 %
Eosinophils Absolute: 0 10*3/uL (ref 0.0–0.5)
Eosinophils Relative: 0 %
HCT: 36.7 % (ref 36.0–46.0)
Hemoglobin: 11.2 g/dL — ABNORMAL LOW (ref 12.0–15.0)
Immature Granulocytes: 1 %
Lymphocytes Relative: 30 %
Lymphs Abs: 1.1 10*3/uL (ref 0.7–4.0)
MCH: 24.4 pg — ABNORMAL LOW (ref 26.0–34.0)
MCHC: 30.5 g/dL (ref 30.0–36.0)
MCV: 80 fL (ref 80.0–100.0)
Monocytes Absolute: 0.3 10*3/uL (ref 0.1–1.0)
Monocytes Relative: 8 %
Neutro Abs: 2.1 10*3/uL (ref 1.7–7.7)
Neutrophils Relative %: 60 %
Platelets: 196 10*3/uL (ref 150–400)
RBC: 4.59 MIL/uL (ref 3.87–5.11)
RDW: 14.2 % (ref 11.5–15.5)
WBC: 3.5 10*3/uL — ABNORMAL LOW (ref 4.0–10.5)
nRBC: 0 % (ref 0.0–0.2)

## 2019-10-16 LAB — COMPREHENSIVE METABOLIC PANEL
ALT: 22 U/L (ref 0–44)
AST: 26 U/L (ref 15–41)
Albumin: 3.7 g/dL (ref 3.5–5.0)
Alkaline Phosphatase: 99 U/L (ref 38–126)
Anion gap: 12 (ref 5–15)
BUN: 16 mg/dL (ref 8–23)
CO2: 26 mmol/L (ref 22–32)
Calcium: 9.1 mg/dL (ref 8.9–10.3)
Chloride: 100 mmol/L (ref 98–111)
Creatinine, Ser: 0.89 mg/dL (ref 0.44–1.00)
GFR calc Af Amer: >60 mL/min (ref >60)
GFR calc non Af Amer: >60 mL/min (ref >60)
Glucose, Bld: 99 mg/dL (ref 70–99)
Potassium: 3.4 mmol/L — ABNORMAL LOW (ref 3.5–5.1)
Sodium: 138 mmol/L (ref 135–145)
Total Bilirubin: 0.7 mg/dL (ref 0.3–1.2)
Total Protein: 7.8 g/dL (ref 6.5–8.1)

## 2019-10-16 LAB — URINALYSIS, COMPLETE (UACMP) WITH MICROSCOPIC
Bacteria, UA: NONE SEEN
Bilirubin Urine: NEGATIVE
Glucose, UA: NEGATIVE mg/dL
Hgb urine dipstick: NEGATIVE
Ketones, ur: NEGATIVE mg/dL
Leukocytes,Ua: NEGATIVE
Nitrite: NEGATIVE
Protein, ur: NEGATIVE mg/dL
Specific Gravity, Urine: 1.028 (ref 1.005–1.030)
pH: 6 (ref 5.0–8.0)

## 2019-10-16 LAB — SARS CORONAVIRUS 2 (TAT 6-24 HRS): SARS Coronavirus 2: NEGATIVE

## 2019-10-16 LAB — INFLUENZA PANEL BY PCR (TYPE A & B)
Influenza A By PCR: NEGATIVE
Influenza B By PCR: NEGATIVE

## 2019-10-16 MED ORDER — ONDANSETRON HCL 4 MG/2ML IJ SOLN
4.0000 mg | Freq: Once | INTRAMUSCULAR | Status: AC
  Administered 2019-10-16: 4 mg via INTRAVENOUS
  Filled 2019-10-16: qty 2

## 2019-10-16 MED ORDER — SODIUM CHLORIDE 0.9 % IV BOLUS
1000.0000 mL | Freq: Once | INTRAVENOUS | Status: AC
  Administered 2019-10-16: 05:00:00 1000 mL via INTRAVENOUS

## 2019-10-16 MED ORDER — ACETAMINOPHEN 500 MG PO TABS
1000.0000 mg | ORAL_TABLET | Freq: Once | ORAL | Status: AC
  Administered 2019-10-16: 1000 mg via ORAL
  Filled 2019-10-16: qty 2

## 2019-10-16 NOTE — ED Notes (Addendum)
ED Provider at bedside.  Pt c/o cough (with production), body aches, sore throat, HA, abdominal pain, frequent urination, body tingling, lower back pain (right lower sharp and nagging), right breast pain at nipple and right neck pain for 2 days.  Most of these s/sx over the last week  Pt reports family hx of breast CA with death  Pt keeps daughter's child and reports daughter recent travel to [place]  Pt denies SOB, loss of taste/smell, CP, diarrhea

## 2019-10-16 NOTE — ED Provider Notes (Signed)
[facility] Emergency Department Provider Note  ____________________________________________  Time seen: Approximately 4:48 AM  I have reviewed the triage vital signs and the nursing notes.   HISTORY  Chief Complaint Cough and Headache   HPI Jacqueline Delgado is a 69 y.o. female with a history of GERD, hiatal hernia, hypertension, obesity, sleep apnea on no CPAP who presents for evaluation of viral-like symptoms.  Patient reports productive cough but unable to bring up sputum, body aches, chills, sore throat, headache for several days.  She also has had more frequent bowel movements which are "mushy." She has had some nausea but no vomiting.   She has not checked for a fever.  She has not been exposed to anybody with Covid however she does keep her grandbabies with her during the day and the mother of 1 of him has been to [place] twice recently.  She denies chest pain or shortness of breath, loss of taste or smell.  She is also complaining of right breast pain for the last 2 days.  She has noticed a change in the shape of her nipple.  She has a strong family history of breast cancer.  Patient's last mammogram was in 2017 per epic review and that was normal.  She denies any drainage from the breast but reports that the nipple area is extremely tender to the touch.  Patient is also complaining of urinary frequency for over a week now.  She was diagnosed on December 23 with a UTI and was put on amoxicillin.  She reports taking 4 days out of 5 of her prescription.  Her last day is tomorrow.  She is also complaining of mild midline lower back pain which she describes as a dull constant pain that started today.  Past Medical History:  Diagnosis Date  . Anginal pain ([place])     3/8-12/21/10  . Arthritis    Osteoarthritis in BLE knee  . Colon polyp   . GERD (gastroesophageal reflux disease)   . Hiatal hernia   . History of hiatal hernia   . Hypertension    controlled  .  Obesity   . Reflux   . Sleep apnea    NO CPAP  . Small vessel disease [facility])     Patient Active Problem List   Diagnosis Date Noted  . Abnormal urine 10/01/2019  . SOB (shortness of breath) on exertion 09/23/2019  . Anemia 09/23/2019  . B12 deficiency 09/23/2019  . Vitamin D deficiency 09/23/2019  . Dark urine 05/14/2019  . Hot flashes 05/14/2019  . Screening for breast cancer 05/14/2019  . Skin fissures 04/22/2019  . Obesity (BMI 35.0-39.9 without comorbidity) 12/09/2018  . Fatigue 12/09/2018  . OSA (obstructive sleep apnea) 12/02/2018  . Localized swelling of finger of left hand 11/18/2018  . Cough 11/11/2018  . Genital herpes simplex 11/11/2018  . Essential hypertension 11/11/2018  . GERD (gastroesophageal reflux disease) 11/11/2018  . Plantar fasciitis 05/03/2016  . Depression, major, recurrent, moderate ([place]) 07/06/2015    Past Surgical History:  Procedure Laterality Date  . ABDOMINAL HYSTERECTOMY     partial; per patient has left ovary  . CHOLECYSTECTOMY    . COLONOSCOPY W/ POLYPECTOMY     adenomatous colon polyp  . COLONOSCOPY WITH PROPOFOL N/A 12/10/2017   Procedure: COLONOSCOPY WITH PROPOFOL;  Surgeon: Manya Silvas, MD;  Location: [facility] ENDOSCOPY;  Service: Endoscopy;  Laterality: N/A;  . ESOPHAGOGASTRODUODENOSCOPY (EGD) WITH PROPOFOL N/A 10/11/2015   Procedure: ESOPHAGOGASTRODUODENOSCOPY (EGD) WITH PROPOFOL;  Surgeon: Manya Silvas, MD;  Location: [facility] ENDOSCOPY;  Service: Endoscopy;  Laterality: N/A;  . ESOPHAGOGASTRODUODENOSCOPY (EGD) WITH PROPOFOL N/A 12/10/2017   Procedure: ESOPHAGOGASTRODUODENOSCOPY (EGD) WITH PROPOFOL;  Surgeon: Manya Silvas, MD;  Location: [facility] ENDOSCOPY;  Service: Endoscopy;  Laterality: N/A;  . JOINT REPLACEMENT Right    knee, Oct 2012  . JOINT REPLACEMENT     hopefully getting left partial knee replacement in July 2016  . SAVORY DILATION N/A 10/11/2015   Procedure: SAVORY DILATION;  Surgeon: Manya Silvas, MD;  Location:  [facility] ENDOSCOPY;  Service: Endoscopy;  Laterality: N/A;  . SHOULDER ARTHROSCOPY WITH ROTATOR CUFF REPAIR AND SUBACROMIAL DECOMPRESSION Right 06/14/2015   Procedure: SHOULDER ARTHROSCOPY WITH mini open ROTATOR CUFF REPAIR AND SUBACROMIAL DECOMPRESSION, release long head biceps tendon.;  Surgeon: Leanor Kail, MD;  Location: [facility] ORS;  Service: Orthopedics;  Laterality: Right;    Prior to Admission medications   Medication Sig Start Date End Date Taking? Authorizing Provider  ACIPHEX 20 MG tablet TAKE 1 TABLET (20 MG TOTAL) BY MOUTH DAILY. TAKE 1 TABLET TWICE A DAY. 07/06/19   Crecencio Mc, MD  amLODipine (NORVASC) 5 MG tablet amlodipine 5 mg tablet    [provider]  amoxicillin-clavulanate (AUGMENTIN) 875-125 MG tablet Take by mouth every 12 hours for 5 days. 10/07/19   Burnard Hawthorne, FNP  cyanocobalamin (,VITAMIN B-12,) 1000 MCG/ML injection 1000 mcg (1 mg) injection once per week for four weeks, followed by 1000 mcg injection once per month. 09/24/19   Burnard Hawthorne, FNP  FLUoxetine (PROZAC) 20 MG tablet Take 1 tablet (20 mg total) by mouth daily. 08/11/19   Guse, Jacquelynn Cree, FNP  hydrochlorothiazide (MICROZIDE) 12.5 MG capsule Take 1 capsule (12.5 mg total) by mouth daily. 09/24/19   Burnard Hawthorne, FNP  losartan (COZAAR) 50 MG tablet Take 1 tablet (50 mg total) by mouth every evening. 10/11/19   Burnard Hawthorne, FNP  meloxicam (MOBIC) 15 MG tablet Take 15 mg by mouth daily. 08/07/19   [provider]  traMADol (ULTRAM) 50 MG tablet TAKE 1 TABLET (50 MG TOTAL) BY MOUTH EVERY 6 (SIX) HOURS AS NEEDED FOR PAIN FOR UP TO 30 DOSES 03/29/19   [provider]  Vitamin D, Ergocalciferol, (DRISDOL) 1.25 MG (50000 UT) CAPS capsule Take 1 capsule (50,000 Units total) by mouth every 7 (seven) days. 09/23/19   Einar Pheasant, MD    Allergies Ibuprofen  Family History  Problem Relation Age of Onset  . Alcoholism Other        brother  . Arthritis Other         parent  . Breast cancer Other        mother and grandmother  . Hyperlipidemia Father   . Heart disease Father   . Stroke Father   . Hypertension Father   . Diabetes Other        parent and other family member  . Breast cancer Mother 5  . Breast cancer Maternal Grandmother        young    Social History Social History   Tobacco Use  . Smoking status: Never Smoker  . Smokeless tobacco: Never Used  Substance Use Topics  . Alcohol use: No  . Drug use: No    Review of Systems  Constitutional: Negative for fever. + Chills and body aches Eyes: Negative for visual changes. ENT: + sore throat. Neck: No neck pain  Cardiovascular: Negative for chest pain. Respiratory: Negative for shortness of  breath. + Cough Breast: + R breast pain Gastrointestinal: Negative for abdominal pain, vomiting or diarrhea. Genitourinary: Negative for dysuria.+ Frequency  Musculoskeletal: +lower back pain. Skin: Negative for rash. Neurological: Negative for headaches, weakness or numbness. Psych: No SI or HI  ____________________________________________   PHYSICAL EXAM:  VITAL SIGNS: ED Triage Vitals  Enc Vitals Group     BP 10/16/19 0233 (!) 146/127     Pulse Rate 10/16/19 0233 86     Resp 10/16/19 0233 19     Temp 10/16/19 0233 99.7 F (37.6 C)     Temp Source 10/16/19 0233 Oral     SpO2 10/16/19 0233 94 %     Weight 10/16/19 0234 276 lb (125.2 kg)     Height 10/16/19 0234 5\' 4"  (1.626 m)     Head Circumference --      Peak Flow --      Pain Score 10/16/19 0234 8     Pain Loc --      Pain Edu? --      Excl. in [place]? --     Constitutional: Alert and oriented. Well appearing and in no apparent distress. HEENT:      Head: Normocephalic and atraumatic.         Eyes: Conjunctivae are normal. Sclera is non-icteric.       Mouth/Throat: Mucous membranes are moist.       Neck: Supple with no signs of meningismus. Cardiovascular: Regular rate and rhythm. No murmurs, gallops, or rubs.  2+ symmetrical distal pulses are present in all extremities. No JVD. Respiratory: Normal respiratory effort. Lungs are clear to auscultation bilaterally. No wheezes, crackles, or rhonchi.  Gastrointestinal: Soft, non tender, and non distended with positive bowel sounds. No rebound or guarding. Genitourinary: No CVA tenderness. Breast: R nipple is inverted which is abnormal when compared to the left, there is a palpable tender mass under the nipple, no erythema or warmth of the overlying skin, no drainage from the nipple.  Musculoskeletal: Nontender with normal range of motion in all extremities. No edema, cyanosis, or erythema of extremities. Neurologic: Normal speech and language. Face is symmetric. Moving all extremities. No gross focal neurologic deficits are appreciated. Skin: Skin is warm, dry and intact. No rash noted. Psychiatric: Mood and affect are normal. Speech and behavior are normal.  ____________________________________________   LABS (all labs ordered are listed, but only abnormal results are displayed)  Labs Reviewed  URINALYSIS, COMPLETE (UACMP) WITH MICROSCOPIC - Abnormal; Notable for the following components:      Result Value   Color, Urine YELLOW (*)    APPearance CLEAR (*)    All other components within normal limits  CBC WITH DIFFERENTIAL/PLATELET - Abnormal; Notable for the following components:   WBC 3.5 (*)    Hemoglobin 11.2 (*)    MCH 24.4 (*)    All other components within normal limits  COMPREHENSIVE METABOLIC PANEL - Abnormal; Notable for the following components:   Potassium 3.4 (*)    All other components within normal limits  SARS CORONAVIRUS 2 (TAT 6-24 HRS)  INFLUENZA PANEL BY PCR (TYPE A & B)   ____________________________________________  EKG  none  ____________________________________________  RADIOLOGY  I have personally reviewed the images performed during this visit and I agree with the Radiologist's read.   Interpretation by  Radiologist:  DG Chest 2 View  Result Date: 10/16/2019 CLINICAL DATA:  Initial evaluation for acute cough. EXAM: CHEST - 2 VIEW COMPARISON:  Prior radiograph from 04/12/2019. FINDINGS: The  cardiac and mediastinal silhouettes are stable in size and contour, and remain within normal limits. The lungs are normally inflated. No airspace consolidation, pleural effusion, or pulmonary edema. No pneumothorax. No acute osseous abnormality. IMPRESSION: No active cardiopulmonary disease. Electronically Signed   By: Jeannine Boga M.D.   On: 10/16/2019 03:02     ____________________________________________   PROCEDURES  Procedure(s) performed: None Procedures Critical Care performed:  None ____________________________________________   INITIAL IMPRESSION / ASSESSMENT AND PLAN / ED COURSE   69 y.o. female with a history of GERD, hiatal hernia, hypertension, obesity, sleep apnea on no CPAP who presents for evaluation of viral-like symptoms.   # viral-like symptoms: cough, chills, body aches, diarrhea, fatigue, sore throat, HA - Ddx COVID, flu, viral illness, PNA. CXR negative for PNA.  Patient is otherwise extremely well-appearing with normal work of breathing, normal sats both at rest and with ambulation.  Will do Covid and flu swab.  Will give Tylenol, Zofran and fluids for symptom relief.  #Urinary frequency: Review of epic shows that patient's urine culture grew E. coli sensitive to amoxicillin.  Will recheck urinalysis.  #breast pain: Patient with an obvious palpable mass underneath her right nipple that is extremely tender with inverted nipple which is abnormal when compared to the left.  Concerns for possible malignancy versus an abscess.  There is no erythema or warmth of the overlying skin, therefore less likely to be an abscess.  Recommended close follow-up with PCP for a mammogram especially with a strong family history of breast cancer.    _________________________ 6:34 AM on  10/16/2019 -----------------------------------------  Flu negative.  Chest x-ray negative.  Labs showing mild leukopenia with white count of 3.5 which can be seen during Covid infections.  No significant electrolyte derangements.  Flu swab is negative.  Covid swab is pending.  UA no longer showing signs of active UTI. Discussed with patient quarantine until the results of her Covid swab and quarantine if Covid swab is positive.  We talked that it is extremely important that she calls her primary care doctor first thing in the morning for a mammogram.  There is already an order in epic for a mammogram.  I really encourage her to call for an appointment as soon as possible.  I told her to finish her last day of antibiotics for her UTI, increase oral hydration, Tylenol or ibuprofen for body aches and fever.  And follow-up with her PCP.  Discussed my return precautions.    As part of my medical decision making, I reviewed the following data within the [place] notes reviewed and incorporated, Labs reviewed , Old chart reviewed, Radiograph reviewed , Notes from prior ED visits and [place] Controlled Substance Database   Please note:  Patient was evaluated in Emergency Department today for the symptoms described in the history of present illness. Patient was evaluated in the context of the global COVID-19 pandemic, which necessitated consideration that the patient might be at risk for infection with the SARS-CoV-2 virus that causes COVID-19. Institutional protocols and algorithms that pertain to the evaluation of patients at risk for COVID-19 are in a state of rapid change based on information released by regulatory bodies including the CDC and federal and state organizations. These policies and algorithms were followed during the patient's care in the ED.  Some ED evaluations and interventions may be delayed as a result of limited staffing during the  pandemic.   ____________________________________________   FINAL CLINICAL IMPRESSION(S) / ED DIAGNOSES  Final diagnoses:  Viral illness  Suspected COVID-19 virus infection  Breast mass, right      NEW MEDICATIONS STARTED DURING THIS VISIT:  ED Discharge Orders    None       Note:  This document was prepared using Dragon voice recognition software and may include unintentional dictation errors.    Alfred Levins, [place], MD 10/16/19 [id number]

## 2019-10-16 NOTE — ED Triage Notes (Signed)
Patient reports cough since Sunday.  Patient reports coughing so much he can't sleep.  Reports having a headache.  Patient also report pain to right breast states very tender to touch.

## 2019-10-16 NOTE — Discharge Instructions (Addendum)
Please make sure to call your doctor first thing Monday morning for a mammogram.  I am concerned that the mass in your breast may be breast cancer.  You have a Covid swab that is pending.  Quarantine for the next 24 hours until the result is back.  If Covid is positive you will need 10 days of quarantine.  Take Tylenol and ibuprofen for body aches and fever.  Continue your antibiotics for a urinary tract infection.  Return to the emergency room for chest pain, shortness of breath, abdominal pain, vomiting, flank pain.   QUARANTINE INSTRUCTION  Follow these instructions at home:  Protecting others To avoid spreading the illness to other people: Quarantine in your home until you have had no cough and fever for 7 days. Household members should also be quarantine for at least 14 days after being exposed to you. Wash your hands often with soap and water. If soap and water are not available, use an alcohol-based hand sanitizer. If you have not cleaned your hands, do not touch your face. Make sure that all people in your household wash their hands well and often. Cover your nose and mouth when you cough or sneeze. Throw away used tissues. Stay home if you have any cold-like or flu-like symptoms. General instructions Go to your local pharmacy and buy a pulse oximeter (this is a machine that measures your oxygen). Check your oxygen levels at least 3 times a day. If your oxygen level is 92% or less return to the emergency room immediately Take over-the-counter and prescription medicines only as told by your health care provider. If you need medication for fever take tylenol or ibuprofen Drink enough fluid to keep your urine pale yellow. Rest at home as directed by your health care provider. Do not give aspirin to a child with the flu, because of the association with Reye's syndrome. Do not use tobacco products, including cigarettes, chewing tobacco, and e-cigarettes. If you need help quitting, ask your  health care provider. Keep all follow-up visits as told by your health care provider. This is important. How is this prevented? Avoid areas where an outbreak has been reported. Avoid large groups of people. Keep a safe distance from people who are coughing and sneezing. Do not touch your face if you have not cleaned your hands. When you are around people who are sick or might be sick, wear a mask to protect yourself. Contact a health care provider if: You have symptoms of SARS (cough, fever, chest pain, shortness of breath) that are not getting better at home. You have a fever. If you have difficulty breathing go to your local ER or call 911

## 2019-10-18 ENCOUNTER — Telehealth: Payer: Self-pay | Admitting: Family

## 2019-10-18 DIAGNOSIS — N6310 Unspecified lump in the right breast, unspecified quadrant: Secondary | ICD-10-CM

## 2019-10-18 NOTE — Telephone Encounter (Signed)
Please advise? Can mammogram be ordered & then [place] work patient in for appointment at some point?

## 2019-10-18 NOTE — Telephone Encounter (Signed)
I spoke with patient & gave her number to [facility]. She said that she would let us know if they could not get her in within the next few days. She also said that IF she ends up needing a breast surgeon that she wanted to be referred to a Dr. Matilde Sprang? She said that she would get [place] information. I cannot find this doctor on Internet.  She will also continue to monitor BP as it has still been running too high. She did not have exact numbers.

## 2019-10-18 NOTE — Telephone Encounter (Signed)
noted 

## 2019-10-18 NOTE — Telephone Encounter (Signed)
Call patient I reviewed ED notes in regards to right breast mass and right inverted nipple, I have ordered bilateral diagnostic mammogram and also right breast ultrasound.  Please give her the phone to [facility] and advise her to call and make an appointment TODAY .  Please let us know if any issues in doing so or if appointment is not the next couple of days. We could also refer her to Dr Terri Piedra ( breast surgeon)  Advise her to keep BP log at home; it was high in ED although I know a stressful place.  Make f/u with [place].

## 2019-10-18 NOTE — Telephone Encounter (Signed)
Pt went to ED on 10/16/2019. Pt states that they found a rather large lump in breast and was told to contact PCP no later than today. Please call pt to advise on next steps. [phone] Thanks.

## 2019-10-22 ENCOUNTER — Ambulatory Visit
Admission: RE | Admit: 2019-10-22 | Discharge: 2019-10-22 | Disposition: A | Discharge status: Home / Self Care | Payer: Medicare HMO | Source: Ambulatory Visit | Attending: Family | Admitting: Family

## 2019-10-22 ENCOUNTER — Ambulatory Visit (INDEPENDENT_AMBULATORY_CARE_PROVIDER_SITE_OTHER): Payer: Medicare HMO | Admitting: Family

## 2019-10-22 ENCOUNTER — Encounter: Payer: Self-pay | Admitting: Family

## 2019-10-22 VITALS — Ht 64.0 in | Wt 276.0 lb

## 2019-10-22 DIAGNOSIS — N644 Mastodynia: Secondary | ICD-10-CM | POA: Diagnosis not present

## 2019-10-22 DIAGNOSIS — N6310 Unspecified lump in the right breast, unspecified quadrant: Secondary | ICD-10-CM | POA: Diagnosis not present

## 2019-10-22 DIAGNOSIS — Z1231 Encounter for screening mammogram for malignant neoplasm of breast: Secondary | ICD-10-CM | POA: Insufficient documentation

## 2019-10-22 DIAGNOSIS — R928 Other abnormal and inconclusive findings on diagnostic imaging of breast: Secondary | ICD-10-CM | POA: Diagnosis not present

## 2019-10-22 DIAGNOSIS — N6459 Other signs and symptoms in breast: Secondary | ICD-10-CM | POA: Diagnosis not present

## 2019-10-22 DIAGNOSIS — I10 Essential (primary) hypertension: Secondary | ICD-10-CM | POA: Diagnosis not present

## 2019-10-22 DIAGNOSIS — R922 Inconclusive mammogram: Secondary | ICD-10-CM | POA: Diagnosis not present

## 2019-10-22 NOTE — Assessment & Plan Note (Addendum)
BP Readings from Last 3 Encounters:  10/16/19 121/68  10/11/19 (!) 154/92  10/01/19 (!) 143/81   121/68 was PRE-losartan. Advised check BP today also concerned that dizziness may be from too aggressive of blood pressure regimen  It may be that she does not need losartan 50 mg.  Furthermore she did not have a BMP repeated after starting the medication.  This is been ordered via [facility] and patient understands to do this today.  Patient will call this afternoon with blood pressure reading.

## 2019-10-22 NOTE — Progress Notes (Signed)
Virtual Visit via Video Note  I connected with@  on 10/22/19 at 12:00 PM EST by a video enabled telemedicine application and verified that I am speaking with the correct person using two identifiers.  Location patient: home Location provider:work  Persons participating in the virtual visit: patient, provider  I discussed the limitations of evaluation and management by telemedicine and the availability of in person appointments. The patient expressed understanding and agreed to proceed.  Interactive audio and video telecommunications were attempted between this provider and patient, however failed, due to patient having technical difficulties or patient did not have access to video capability.  We continued and completed visit with audio only.    HPI: Follow up  HTN- Hasnt check blood pressure. Has some dizziness. NO falls, syncope. Compliant with medication. Started losartan last week however hasnt had labs since.  No SOB, CP.   Right Breast mass [place] and MM BL diagnostic for today. She feels the 'lump' has improved. Nipple is still inverted.   Negative covid 10/16/19.  Feels well .no cough, sob, fever.   UA is negative RBC, WBC.   ROS: See pertinent positives and negatives per HPI.  Past Medical History:  Diagnosis Date  . Anginal pain ([place])     3/8-12/21/10  . Arthritis    Osteoarthritis in BLE knee  . Colon polyp   . GERD (gastroesophageal reflux disease)   . Hiatal hernia   . History of hiatal hernia   . Hypertension    controlled  . Obesity   . Reflux   . Sleep apnea    NO CPAP  . Small vessel disease [facility])     Past Surgical History:  Procedure Laterality Date  . ABDOMINAL HYSTERECTOMY     partial; per patient has left ovary  . CHOLECYSTECTOMY    . COLONOSCOPY W/ POLYPECTOMY     adenomatous colon polyp  . COLONOSCOPY WITH PROPOFOL N/A 12/10/2017   Procedure: COLONOSCOPY WITH PROPOFOL;  Surgeon: Manya Silvas, MD;  Location: [facility] ENDOSCOPY;  Service: Endoscopy;   Laterality: N/A;  . ESOPHAGOGASTRODUODENOSCOPY (EGD) WITH PROPOFOL N/A 10/11/2015   Procedure: ESOPHAGOGASTRODUODENOSCOPY (EGD) WITH PROPOFOL;  Surgeon: Manya Silvas, MD;  Location[facility] ENDOSCOPY;  Service: Endoscopy;  Laterality: N/A;  . ESOPHAGOGASTRODUODENOSCOPY (EGD) WITH PROPOFOL N/A 12/10/2017   Procedure: ESOPHAGOGASTRODUODENOSCOPY (EGD) WITH PROPOFOL;  Surgeon: Manya Silvas, MD;  Location: [facility] ENDOSCOPY;  Service: Endoscopy;  Laterality: N/A;  . JOINT REPLACEMENT Right    knee, Oct 2012  . JOINT REPLACEMENT     hopefully getting left partial knee replacement in July 2016  . SAVORY DILATION N/A 10/11/2015   Procedure: SAVORY DILATION;  Surgeon: Manya Silvas, MD;  Location: [facility] ENDOSCOPY;  Service: Endoscopy;  Laterality: N/A;  . SHOULDER ARTHROSCOPY WITH ROTATOR CUFF REPAIR AND SUBACROMIAL DECOMPRESSION Right 06/14/2015   Procedure: SHOULDER ARTHROSCOPY WITH mini open ROTATOR CUFF REPAIR AND SUBACROMIAL DECOMPRESSION, release long head biceps tendon.;  Surgeon: Leanor Kail, MD;  Location: [facility] ORS;  Service: Orthopedics;  Laterality: Right;    Family History  Problem Relation Age of Onset  . Alcoholism Other        brother  . Arthritis Other        parent  . Breast cancer Other        mother and grandmother  . Hyperlipidemia Father   . Heart disease Father   . Stroke Father   . Hypertension Father   . Diabetes Other        parent  and other family member  . Breast cancer Mother 3  . Breast cancer Maternal Grandmother        young    SOCIAL HX: never smoker   Current Outpatient Medications:  .  ACIPHEX 20 MG tablet, TAKE 1 TABLET (20 MG TOTAL) BY MOUTH DAILY. TAKE 1 TABLET TWICE A DAY., Disp: 60 tablet, Rfl: 3 .  amLODipine (NORVASC) 5 MG tablet, amlodipine 5 mg tablet, Disp: , Rfl:  .  amoxicillin-clavulanate (AUGMENTIN) 875-125 MG tablet, Take by mouth every 12 hours for 5 days., Disp: 10 tablet, Rfl: 0 .  cyanocobalamin (,VITAMIN B-12,) 1000 MCG/ML  injection, 1000 mcg (1 mg) injection once per week for four weeks, followed by 1000 mcg injection once per month., Disp: 1 mL, Rfl: 15 .  FLUoxetine (PROZAC) 20 MG tablet, Take 1 tablet (20 mg total) by mouth daily., Disp: 90 tablet, Rfl: 1 .  hydrochlorothiazide (MICROZIDE) 12.5 MG capsule, Take 1 capsule (12.5 mg total) by mouth daily., Disp: 90 capsule, Rfl: 0 .  losartan (COZAAR) 50 MG tablet, Take 1 tablet (50 mg total) by mouth every evening., Disp: 90 tablet, Rfl: 3 .  meloxicam (MOBIC) 15 MG tablet, Take 15 mg by mouth daily., Disp: , Rfl:  .  traMADol (ULTRAM) 50 MG tablet, TAKE 1 TABLET (50 MG TOTAL) BY MOUTH EVERY 6 (SIX) HOURS AS NEEDED FOR PAIN FOR UP TO 30 DOSES, Disp: , Rfl:  .  Vitamin D, Ergocalciferol, (DRISDOL) 1.25 MG (50000 UT) CAPS capsule, Take 1 capsule (50,000 Units total) by mouth every 7 (seven) days., Disp: 12 capsule, Rfl: 0    ASSESSMENT AND PLAN:  Discussed the following assessment and plan:  No diagnosis found.  -we discussed possible serious and likely etiologies, options for evaluation and workup, limitations of telemedicine visit vs in person visit, treatment, treatment risks and precautions. Pt prefers to treat via telemedicine empirically rather then risking or undertaking an in person visit at this moment. Patient agrees to seek prompt in person care if worsening, new symptoms arise, or if is not improving with treatment.   I discussed the assessment and treatment plan with the patient. The patient was provided an opportunity to ask questions and all were answered. The patient agreed with the plan and demonstrated an understanding of the instructions.   The patient was advised to call back or seek an in-person evaluation if the symptoms worsen or if the condition fails to improve as anticipated.   Mable Paris, FNP  I spent 10 min non face to face w/ pt.

## 2019-10-25 ENCOUNTER — Ambulatory Visit: Payer: Medicare HMO | Admitting: Internal Medicine

## 2019-10-25 NOTE — Progress Notes (Deleted)
New Outpatient Visit Date: 10/25/2019  Referring Provider: Einar Pheasant, [id number],  [place] [phone]  Chief Complaint: ***  HPI:  [name] is a 69 y.o. female who is being seen today for the evaluation of dyspnea on exertion at the request of Dr. Nicki Reaper. She has a history of reported angina with small vessel disease (details unknown), hypertension, GERD, hiatal hernia, and arthritis. ***  --------------------------------------------------------------------------------------------------  Cardiovascular History & Procedures: Cardiovascular Problems:  Shortness of breath  Risk Factors:  ***  Cath/PCI:  ***  CV Surgery:  ***  EP Procedures and Devices:  ***  Non-Invasive Evaluation(s):  ***  Recent CV Pertinent Labs: Lab Results  Component Value Date   CHOL 163 12/09/2018   CHOL 135 06/10/2014   HDL 45 12/09/2018   HDL 36 (L) 06/10/2014   LDLCALC 100 (H) 12/09/2018   LDLCALC 86 06/10/2014   TRIG 88 12/09/2018   TRIG 63 06/10/2014   CHOLHDL 3.6 12/09/2018   K 3.4 (L) 10/16/2019   K 4.0 06/10/2014   MG 2.1 06/09/2014   BUN 16 10/16/2019   BUN 17 09/29/2019   BUN 20 (H) 06/10/2014   CREATININE 0.89 10/16/2019   CREATININE 0.92 06/10/2014    --------------------------------------------------------------------------------------------------  Past Medical History:  Diagnosis Date  . Anginal pain ([place])     3/8-12/21/10  . Arthritis    Osteoarthritis in BLE knee  . Colon polyp   . GERD (gastroesophageal reflux disease)   . Hiatal hernia   . History of hiatal hernia   . Hypertension    controlled  . Obesity   . Reflux   . Sleep apnea    NO CPAP  . Small vessel disease [facility])     Past Surgical History:  Procedure Laterality Date  . ABDOMINAL HYSTERECTOMY     partial; per patient has left ovary  . CHOLECYSTECTOMY    . COLONOSCOPY W/ POLYPECTOMY     adenomatous colon polyp  . COLONOSCOPY WITH PROPOFOL N/A  12/10/2017   Procedure: COLONOSCOPY WITH PROPOFOL;  Surgeon: Manya Silvas, MD;  Location: [facility] ENDOSCOPY;  Service: Endoscopy;  Laterality: N/A;  . ESOPHAGOGASTRODUODENOSCOPY (EGD) WITH PROPOFOL N/A 10/11/2015   Procedure: ESOPHAGOGASTRODUODENOSCOPY (EGD) WITH PROPOFOL;  Surgeon: Manya Silvas, MD;  [facility] ENDOSCOPY;  Service: Endoscopy;  Laterality: N/A;  . ESOPHAGOGASTRODUODENOSCOPY (EGD) WITH PROPOFOL N/A 12/10/2017   Procedure: ESOPHAGOGASTRODUODENOSCOPY (EGD) WITH PROPOFOL;  Surgeon: Manya Silvas, MD;  Location: [facility] ENDOSCOPY;  Service: Endoscopy;  Laterality: N/A;  . JOINT REPLACEMENT Right    knee, Oct 2012  . JOINT REPLACEMENT     hopefully getting left partial knee replacement in July 2016  . SAVORY DILATION N/A 10/11/2015   Procedure: SAVORY DILATION;  Surgeon: Manya Silvas, MD;  Location: [facility] ENDOSCOPY;  Service: Endoscopy;  Laterality: N/A;  . SHOULDER ARTHROSCOPY WITH ROTATOR CUFF REPAIR AND SUBACROMIAL DECOMPRESSION Right 06/14/2015   Procedure: SHOULDER ARTHROSCOPY WITH mini open ROTATOR CUFF REPAIR AND SUBACROMIAL DECOMPRESSION, release long head biceps tendon.;  Surgeon: Leanor Kail, MD;  Location: [facility] ORS;  Service: Orthopedics;  Laterality: Right;    No outpatient medications have been marked as taking for the 10/25/19 encounter (Appointment) with Vernis Cabacungan, Harrell Gave, MD.    Allergies: Ibuprofen  Social History   Tobacco Use  . Smoking status: Never Smoker  . Smokeless tobacco: Never Used  Substance Use Topics  . Alcohol use: No  . Drug use: No    Family History  Problem Relation Age  of Onset  . Alcoholism Other        brother  . Arthritis Other        parent  . Breast cancer Other        mother and grandmother  . Hyperlipidemia Father   . Heart disease Father   . Stroke Father   . Hypertension Father   . Diabetes Other        parent and other family member  . Breast cancer Mother 32  . Breast cancer Maternal Grandmother         young    Review of Systems: A 12-system review of systems was performed and was negative except as noted in the HPI.  --------------------------------------------------------------------------------------------------  Physical Exam: There were no vitals taken for this visit.  General:  *** HEENT: No conjunctival pallor or scleral icterus. Facemask in place. Neck: Supple without lymphadenopathy, thyromegaly, JVD, or HJR. No carotid bruit. Lungs: Normal work of breathing. Clear to auscultation bilaterally without wheezes or crackles. Heart: Regular rate and rhythm without murmurs, rubs, or gallops. Non-displaced PMI. Abd: Bowel sounds present. Soft, NT/ND without hepatosplenomegaly Ext: No lower extremity edema. Radial, PT, and DP pulses are 2+ bilaterally Skin: Warm and dry without rash. Neuro: CNIII-XII intact. Strength and fine-touch sensation intact in upper and lower extremities bilaterally. Psych: Normal mood and affect.  EKG:  ***  Lab Results  Component Value Date   WBC 3.5 (L) 10/16/2019   HGB 11.2 (L) 10/16/2019   HCT 36.7 10/16/2019   MCV 80.0 10/16/2019   PLT 196 10/16/2019    Lab Results  Component Value Date   NA 138 10/16/2019   K 3.4 (L) 10/16/2019   CL 100 10/16/2019   CO2 26 10/16/2019   BUN 16 10/16/2019   CREATININE 0.89 10/16/2019   GLUCOSE 99 10/16/2019   ALT 22 10/16/2019    Lab Results  Component Value Date   CHOL 163 12/09/2018   HDL 45 12/09/2018   LDLCALC 100 (H) 12/09/2018   TRIG 88 12/09/2018   CHOLHDL 3.6 12/09/2018     --------------------------------------------------------------------------------------------------  ASSESSMENT AND PLAN: Harrell Gave Ha Shannahan, MD 10/25/2019 7:38 AM

## 2019-10-28 ENCOUNTER — Other Ambulatory Visit: Payer: Self-pay | Admitting: Internal Medicine

## 2019-10-28 DIAGNOSIS — K219 Gastro-esophageal reflux disease without esophagitis: Secondary | ICD-10-CM

## 2019-10-29 ENCOUNTER — Telehealth: Payer: Self-pay

## 2019-10-29 ENCOUNTER — Other Ambulatory Visit: Payer: Self-pay | Admitting: Internal Medicine

## 2019-10-29 NOTE — Telephone Encounter (Signed)
I spoke with patient & she let me know that she has tried & failed all alternatives to aciphex. I resent in aciphex for her & called pharmacy to see if they could generate PA. They were unable to electronically do this. I tried to submit by generating through Cover My Meds & received a call from patient's insurance before I could do so. I was able to verbally give them information & PA was approved. They will fax me info so I can document that in chart. I called back pharmacy & they did not have update on their end. They advised that I have patient call later tonight & they could rerun prescription to see if it had updated. Pharmacist Tammy said that it may take 24hrs. I called to let patient know this & she said that she would call pharmacy today or tomorrow to have them resubmit. I will be on lookout for PA approval to document in chart.

## 2019-11-02 ENCOUNTER — Telehealth: Payer: Self-pay

## 2019-11-02 NOTE — Telephone Encounter (Signed)
PA approved for Aciphex 10/15/19-10/13/20.

## 2019-11-03 ENCOUNTER — Encounter: Payer: Self-pay | Admitting: Internal Medicine

## 2019-11-03 ENCOUNTER — Ambulatory Visit: Payer: Medicare HMO

## 2019-11-03 ENCOUNTER — Ambulatory Visit (INDEPENDENT_AMBULATORY_CARE_PROVIDER_SITE_OTHER): Payer: Medicare HMO | Admitting: Internal Medicine

## 2019-11-03 DIAGNOSIS — G4733 Obstructive sleep apnea (adult) (pediatric): Secondary | ICD-10-CM | POA: Diagnosis not present

## 2019-11-03 NOTE — Progress Notes (Signed)
[name]      I connected with the patient by telephone enabled telemedicine visit and verified that I am speaking with the correct person using two identifiers.    I discussed the limitations, risks, security and privacy concerns of performing an evaluation and management service by telemedicine and the availability of in-person appointments. I also discussed with the patient that there may be a patient responsible charge related to this service. The patient expressed understanding and agreed to proceed.  PATIENT AGREES AND CONFIRMS -YES   Other persons participating in the visit and their role in the encounter: Patient, nursing  This visit type was conducted due to national recommendations for restrictions regarding the COVID-19 Pandemic (e.g. social distancing).  This format is felt to be most appropriate for this patient at this time.  All issues noted in this document were discussed and addressed.         Date: 11/03/2019  MRN# [id number] Jacqueline Delgado [birth year]   Jacqueline Delgado is a 69 y.o. old female seen in consultation for chief complaint of: Excessive daytime sleepiness   SYNOPSIS She has a diagnosed with OSA more than 10 years ago, she was put on CPAP, she eventually stopped using it and then lost it, she has been off of it for many years. She comes back in today after she told her doctor that she has not been sleeping well, she has been continuing to gain weight. She wakes several times per night, about 1-2 hours.  She is tired during the day, she does not nap. She has headaches in the am.  She has had a few episodes of sleep paralysis, no cataplexy, denies jaw pain, no TMJ.   Chief complaint Excessive daytime sleepiness  HPI Patient is seen today for problems and issues with sleep related to excessive daytime sleepiness Patient  has been having sleep problems for many years Patient has been having excessive daytime sleepiness for a  long time Patient has been having extreme fatigue and tiredness, lack of energy +  very Loud snoring every night + struggling breathe at night and gasps for air   Discussed sleep data and reviewed with patient.  Encouraged proper weight management.  Discussed driving precautions and its relationship with hypersomnolence.  Discussed operating dangerous equipment and its relationship with hypersomnolence.  Discussed sleep hygiene, and benefits of a fixed sleep waked time.  The importance of getting eight or more hours of sleep discussed with patient.  Discussed limiting the use of the computer and television before bedtime.  Decrease naps during the day, so night time sleep will become enhanced.  Limit caffeine, and sleep deprivation.  HTN, stroke, and heart failure are potential risk factors.    EPWORTH SLEEP SCORE 8    PMHX:   Past Medical History:  Diagnosis Date  . Anginal pain ([place])     3/8-12/21/10  . Arthritis    Osteoarthritis in BLE knee  . Colon polyp   . GERD (gastroesophageal reflux disease)   . Hiatal hernia   . History of hiatal hernia   . Hypertension    controlled  . Obesity   . Reflux   . Sleep apnea    NO CPAP  . Small vessel disease ([place])    Surgical Hx:  Past Surgical History:  Procedure Laterality Date  . ABDOMINAL HYSTERECTOMY     partial; per patient has left ovary  . CHOLECYSTECTOMY    . COLONOSCOPY W/ POLYPECTOMY  adenomatous colon polyp  . COLONOSCOPY WITH PROPOFOL N/A 12/10/2017   Procedure: COLONOSCOPY WITH PROPOFOL;  Surgeon: Manya Silvas, MD;  Location: [facility] ENDOSCOPY;  Service: Endoscopy;  Laterality: N/A;  . ESOPHAGOGASTRODUODENOSCOPY (EGD) WITH PROPOFOL N/A 10/11/2015   Procedure: ESOPHAGOGASTRODUODENOSCOPY (EGD) WITH PROPOFOL;  Surgeon: Manya Silvas, MD;  Location: [facility] ENDOSCOPY;  Service: Endoscopy;  Laterality: N/A;  . ESOPHAGOGASTRODUODENOSCOPY (EGD) WITH PROPOFOL N/A 12/10/2017   Procedure: ESOPHAGOGASTRODUODENOSCOPY  (EGD) WITH PROPOFOL;  Surgeon: Manya Silvas, MD;  Location: [facility] ENDOSCOPY;  Service: Endoscopy;  Laterality: N/A;  . JOINT REPLACEMENT Right    knee, Oct 2012  . JOINT REPLACEMENT     hopefully getting left partial knee replacement in July 2016  . SAVORY DILATION N/A 10/11/2015   Procedure: SAVORY DILATION;  Surgeon: Manya Silvas, MD;  Location: [facility] ENDOSCOPY;  Service: Endoscopy;  Laterality: N/A;  . SHOULDER ARTHROSCOPY WITH ROTATOR CUFF REPAIR AND SUBACROMIAL DECOMPRESSION Right 06/14/2015   Procedure: SHOULDER ARTHROSCOPY WITH mini open ROTATOR CUFF REPAIR AND SUBACROMIAL DECOMPRESSION, release long head biceps tendon.;  Surgeon: Leanor Kail, MD;  Location: [facility] ORS;  Service: Orthopedics;  Laterality: Right;   Family Hx:  Family History  Problem Relation Age of Onset  . Alcoholism Other        brother  . Arthritis Other        parent  . Breast cancer Other        mother and grandmother  . Hyperlipidemia Father   . Heart disease Father   . Stroke Father   . Hypertension Father   . Diabetes Other        parent and other family member  . Breast cancer Mother 73  . Breast cancer Maternal Grandmother        young   Social Hx:   Social History   Tobacco Use  . Smoking status: Never Smoker  . Smokeless tobacco: Never Used  Substance Use Topics  . Alcohol use: No  . Drug use: No   Medication:    Current Outpatient Medications:  .  ACIPHEX 20 MG tablet, TAKE 1 TABLET (20 MG TOTAL) BY MOUTH DAILY. TAKE 1 TABLET TWICE A DAY., Disp: 30 tablet, Rfl: 7 .  amLODipine (NORVASC) 5 MG tablet, amlodipine 5 mg tablet, Disp: , Rfl:  .  cyanocobalamin (,VITAMIN B-12,) 1000 MCG/ML injection, 1000 mcg (1 mg) injection once per week for four weeks, followed by 1000 mcg injection once per month., Disp: 1 mL, Rfl: 15 .  FLUoxetine (PROZAC) 20 MG tablet, Take 1 tablet (20 mg total) by mouth daily., Disp: 90 tablet, Rfl: 1 .  hydrochlorothiazide (MICROZIDE) 12.5 MG capsule, Take 1  capsule (12.5 mg total) by mouth daily., Disp: 90 capsule, Rfl: 0 .  losartan (COZAAR) 50 MG tablet, Take 1 tablet (50 mg total) by mouth every evening., Disp: 90 tablet, Rfl: 3 .  meloxicam (MOBIC) 15 MG tablet, Take 15 mg by mouth daily., Disp: , Rfl:  .  traMADol (ULTRAM) 50 MG tablet, TAKE 1 TABLET (50 MG TOTAL) BY MOUTH EVERY 6 (SIX) HOURS AS NEEDED FOR PAIN FOR UP TO 30 DOSES, Disp: , Rfl:  .  Vitamin D, Ergocalciferol, (DRISDOL) 1.25 MG (50000 UT) CAPS capsule, Take 1 capsule (50,000 Units total) by mouth every 7 (seven) days., Disp: 12 capsule, Rfl: 0   Allergies:  Ibuprofen     Review of Systems:  Gen:  Denies  fever, sweats, chills weight loss  HEENT: Denies blurred vision, double vision, ear  pain, eye pain, hearing loss, nose bleeds, sore throat Cardiac:  No dizziness, chest pain or heaviness, chest tightness,edema, No JVD Resp:   No cough, -sputum production, -shortness of breath,-wheezing, -hemoptysis,  Gi: Denies swallowing difficulty, stomach pain, nausea or vomiting, diarrhea, constipation, bowel incontinence Gu:  Denies bladder incontinence, burning urine Ext:   Denies Joint pain, stiffness or swelling Skin: Denies  skin rash, easy bruising or bleeding or hives Endoc:  Denies polyuria, polydipsia , polyphagia or weight change Psych:   Denies depression, insomnia or hallucinations  Other:  All other systems negative     Assessment and Plan:  History of excessive daytime sleepiness snoring with morning headaches And symptoms strongly suggest underlying obstructive sleep apnea Patient will need sleep study for definitive diagnosis Will start auto CPAP as indicated  Hypertension obesity Apnea can contribute to above conditions and for treatment of sleep apnea as an important part of the management  Obesity -recommend significant weight loss -recommend changing diet  Deconditioned state -Recommend increased daily activity and exercise     COVID-19  EDUCATION: The signs and symptoms of COVID-19 were discussed with the patient and how to seek care for testing.  The importance of social distancing was discussed today. Hand Washing Techniques and avoid touching face was advised.     MEDICATION ADJUSTMENTS/LABS AND TESTS ORDERED: Sleep study   CURRENT MEDICATIONS REVIEWED AT LENGTH WITH PATIENT TODAY   Patient satisfied with Plan of action and management. All questions answered  Follow up in 3 months  TOTAL TIME SPENT 23 minutes  Corrin Parker, M.D.  [facility]  Medical Director [place] Director [facility] Cardio-Pulmonary Department

## 2019-11-03 NOTE — Patient Instructions (Signed)
Sleep study needed

## 2019-11-12 ENCOUNTER — Telehealth: Payer: Self-pay | Admitting: Internal Medicine

## 2019-11-12 NOTE — Telephone Encounter (Signed)
Spoke to pt and relayed date/time of covid test for sleep study. Pt voiced her understanding and had no further questions.  11/17/2019 prior to 11:00 at [facility].

## 2019-11-17 ENCOUNTER — Other Ambulatory Visit
Admission: RE | Admit: 2019-11-17 | Discharge: 2019-11-17 | Disposition: A | Discharge status: Home / Self Care | Payer: Medicare HMO | Source: Ambulatory Visit | Attending: Internal Medicine | Admitting: Internal Medicine

## 2019-11-17 DIAGNOSIS — Z20822 Contact with and (suspected) exposure to covid-19: Secondary | ICD-10-CM | POA: Diagnosis not present

## 2019-11-17 DIAGNOSIS — Z01812 Encounter for preprocedural laboratory examination: Secondary | ICD-10-CM | POA: Insufficient documentation

## 2019-11-17 LAB — SARS CORONAVIRUS 2 (TAT 6-24 HRS): SARS Coronavirus 2: NEGATIVE

## 2019-11-19 ENCOUNTER — Ambulatory Visit (INDEPENDENT_AMBULATORY_CARE_PROVIDER_SITE_OTHER): Payer: Medicare HMO | Admitting: Cardiology

## 2019-11-19 ENCOUNTER — Ambulatory Visit: Payer: Medicare HMO | Attending: Pulmonary Disease

## 2019-11-19 ENCOUNTER — Encounter: Payer: Self-pay | Admitting: Cardiology

## 2019-11-19 ENCOUNTER — Other Ambulatory Visit: Payer: Self-pay

## 2019-11-19 VITALS — BP 140/88 | HR 62 | Ht 64.0 in | Wt 280.0 lb

## 2019-11-19 DIAGNOSIS — G4710 Hypersomnia, unspecified: Secondary | ICD-10-CM | POA: Insufficient documentation

## 2019-11-19 DIAGNOSIS — R110 Nausea: Secondary | ICD-10-CM | POA: Diagnosis not present

## 2019-11-19 DIAGNOSIS — G4733 Obstructive sleep apnea (adult) (pediatric): Secondary | ICD-10-CM | POA: Diagnosis not present

## 2019-11-19 DIAGNOSIS — R0609 Other forms of dyspnea: Secondary | ICD-10-CM

## 2019-11-19 DIAGNOSIS — K219 Gastro-esophageal reflux disease without esophagitis: Secondary | ICD-10-CM | POA: Diagnosis not present

## 2019-11-19 DIAGNOSIS — I10 Essential (primary) hypertension: Secondary | ICD-10-CM | POA: Diagnosis not present

## 2019-11-19 DIAGNOSIS — R600 Localized edema: Secondary | ICD-10-CM | POA: Diagnosis not present

## 2019-11-19 DIAGNOSIS — K529 Noninfective gastroenteritis and colitis, unspecified: Secondary | ICD-10-CM | POA: Diagnosis not present

## 2019-11-19 DIAGNOSIS — N898 Other specified noninflammatory disorders of vagina: Secondary | ICD-10-CM | POA: Diagnosis not present

## 2019-11-19 DIAGNOSIS — R0600 Dyspnea, unspecified: Secondary | ICD-10-CM

## 2019-11-19 DIAGNOSIS — R1010 Upper abdominal pain, unspecified: Secondary | ICD-10-CM | POA: Diagnosis not present

## 2019-11-19 MED ORDER — LOSARTAN POTASSIUM 50 MG PO TABS: 50.0000 mg | ORAL_TABLET | Freq: Every day | ORAL | 3 refills | Status: DC

## 2019-11-19 MED ORDER — HYDROCHLOROTHIAZIDE 25 MG PO TABS: 25.0000 mg | ORAL_TABLET | Freq: Every day | ORAL | 3 refills | Status: DC

## 2019-11-19 NOTE — Progress Notes (Signed)
Cardiology Office Note:    Date:  11/19/2019   ID:  Jacqueline Delgado, DOB 22-Dec-1950, MRN [id number]  PCP:  Burnard Hawthorne, FNP  Cardiologist:  Kate Sable, MD  Electrophysiologist:  None   Referring MD: Einar Pheasant, MD   Chief Complaint  Patient presents with  . New Patient (Initial Visit)    SOB with exertion and BL edema in feet; Meds verbally reviewed with patient.    History of Present Illness:    Jacqueline Delgado is a 70 y.o. female with a hx of hypertension, sleep apnea, obesity presents due to loss of breath on exertion and bilateral lower extremity edema.  Patient states having worsening shortness of breath with exertion.  This has been ongoing for about 3 months now.  She attributes her shortness of breath due to deconditioning and weight gain.  She denies chest pain.  She also noticed lower extremity edema which is not related with positioning or being on her feet.  She has been taking amlodipine for hypertension for about 2 years now.  She stopped amlodipine in the past with improvement in her edema.  Patient with history of daytime somnolence and fatigue.  Recently seen by pulmonology and is being worked up with a sleep study and possible CPAP evaluation.  Past Medical History:  Diagnosis Date  . Anginal pain ([place])     3/8-12/21/10  . Arthritis    Osteoarthritis in BLE knee  . Colon polyp   . GERD (gastroesophageal reflux disease)   . Hiatal hernia   . History of hiatal hernia   . Hypertension    controlled  . Obesity   . Reflux   . Sleep apnea    NO CPAP  . Small vessel disease [facility])     Past Surgical History:  Procedure Laterality Date  . ABDOMINAL HYSTERECTOMY     partial; per patient has left ovary  . CHOLECYSTECTOMY    . COLONOSCOPY W/ POLYPECTOMY     adenomatous colon polyp  . COLONOSCOPY WITH PROPOFOL N/A 12/10/2017   Procedure: COLONOSCOPY WITH PROPOFOL;  Surgeon: Manya Silvas, MD;  Location: [facility] ENDOSCOPY;  Service: Endoscopy;   Laterality: N/A;  . ESOPHAGOGASTRODUODENOSCOPY (EGD) WITH PROPOFOL N/A 10/11/2015   Procedure: ESOPHAGOGASTRODUODENOSCOPY (EGD) WITH PROPOFOL;  Surgeon: Manya Silvas, MD;  Location: [facility] ENDOSCOPY;  Service: Endoscopy;  Laterality: N/A;  . ESOPHAGOGASTRODUODENOSCOPY (EGD) WITH PROPOFOL N/A 12/10/2017   Procedure: ESOPHAGOGASTRODUODENOSCOPY (EGD) WITH PROPOFOL;  Surgeon: Manya Silvas, MD;  Location: [facility] ENDOSCOPY;  Service: Endoscopy;  Laterality: N/A;  . JOINT REPLACEMENT Right    knee, Oct 2012  . JOINT REPLACEMENT     hopefully getting left partial knee replacement in July 2016  . SAVORY DILATION N/A 10/11/2015   Procedure: SAVORY DILATION;  Surgeon: Manya Silvas, MD;  Location: [facility] ENDOSCOPY;  Service: Endoscopy;  Laterality: N/A;  . SHOULDER ARTHROSCOPY WITH ROTATOR CUFF REPAIR AND SUBACROMIAL DECOMPRESSION Right 06/14/2015   Procedure: SHOULDER ARTHROSCOPY WITH mini open ROTATOR CUFF REPAIR AND SUBACROMIAL DECOMPRESSION, release long head biceps tendon.;  Surgeon: Leanor Kail, MD;  Location: [facility] ORS;  Service: Orthopedics;  Laterality: Right;     Current Medications: Current Meds  Medication Sig  . ACIPHEX 20 MG tablet TAKE 1 TABLET (20 MG TOTAL) BY MOUTH DAILY. TAKE 1 TABLET TWICE A DAY.  . cyanocobalamin (,VITAMIN B-12,) 1000 MCG/ML injection 1000 mcg (1 mg) injection once per week for four weeks, followed by 1000 mcg injection once per month.  Marland Kitchen FLUoxetine (  PROZAC) 20 MG tablet Take 1 tablet (20 mg total) by mouth daily.  . meloxicam (MOBIC) 15 MG tablet Take 15 mg by mouth daily.  . traMADol (ULTRAM) 50 MG tablet TAKE 1 TABLET (50 MG TOTAL) BY MOUTH EVERY 6 (SIX) HOURS AS NEEDED FOR PAIN FOR UP TO 30 DOSES  . Vitamin D, Ergocalciferol, (DRISDOL) 1.25 MG (50000 UT) CAPS capsule Take 1 capsule (50,000 Units total) by mouth every 7 (seven) days.  . [DISCONTINUED] amLODipine (NORVASC) 5 MG tablet amlodipine 5 mg tablet  . [DISCONTINUED] hydrochlorothiazide (MICROZIDE)  12.5 MG capsule Take 1 capsule (12.5 mg total) by mouth daily.     Allergies:   Ibuprofen   Social History   Socioeconomic History  . Marital status: Single    Spouse name: Not on file  . Number of children: Not on file  . Years of education: Not on file  . Highest education level: Not on file  Occupational History  . Not on file  Tobacco Use  . Smoking status: Never Smoker  . Smokeless tobacco: Never Used  Substance and Sexual Activity  . Alcohol use: No  . Drug use: No  . Sexual activity: Not on file  Other Topics Concern  . Not on file  Social History Narrative  . Not on file   Social Determinants of Health   Financial Resource Strain: Low Risk   . Difficulty of Paying Living Expenses: Not hard at all  Food Insecurity: No Food Insecurity  . Worried About Charity fundraiser in the Last Year: Never true  . Ran Out of Food in the Last Year: Never true  Transportation Needs: No Transportation Needs  . Lack of Transportation (Medical): No  . Lack of Transportation (Non-Medical): No  Physical Activity: Unknown  . Days of Exercise per Week: 0 days  . Minutes of Exercise per Session: Not on file  Stress: Stress Concern Present  . Feeling of Stress : To some extent  Social Connections:   . Frequency of Communication with Friends and Family: Not on file  . Frequency of Social Gatherings with Friends and Family: Not on file  . Attends Religious Services: Not on file  . Active Member of Clubs or Organizations: Not on file  . Attends Archivist Meetings: Not on file  . Marital Status: Not on file     Family History: The patient's family history includes Alcoholism in an other family member; Arthritis in an other family member; Breast cancer in her maternal grandmother and another family member; Breast cancer (age of onset: 80) in her mother; Diabetes in an other family member; Heart disease in her father; Hyperlipidemia in her father; Hypertension in her father;  Stroke in her father.  ROS:   Please see the history of present illness.     All other systems reviewed and are negative.  EKGs/Labs/Other Studies Reviewed:    The following studies were reviewed today:   EKG:  EKG is  ordered today.  The ekg ordered today demonstrates normal sinus rhythm, cannot rule out anterior infarct.  Recent Labs: 09/21/2019: TSH 1.53 10/16/2019: ALT 22; BUN 16; Creatinine, Ser 0.89; Hemoglobin 11.2; Platelets 196; Potassium 3.4; Sodium 138  Recent Lipid Panel    Component Value Date/Time   CHOL 163 12/09/2018 1544   CHOL 135 06/10/2014 0411   TRIG 88 12/09/2018 1544   TRIG 63 06/10/2014 0411   HDL 45 12/09/2018 1544   HDL 36 (L) 06/10/2014 0411   CHOLHDL 3.6  12/09/2018 1544   VLDL 13 06/10/2014 0411   LDLCALC 100 (H) 12/09/2018 1544   LDLCALC 86 06/10/2014 0411    Physical Exam:    VS:  BP 140/88 (BP Location: Right Arm, Patient Position: Sitting, Cuff Size: Large)   Pulse 62   Ht 5\' 4"  (1.626 m)   Wt 280 lb (127 kg)   SpO2 98%   BMI 48.06 kg/m     Wt Readings from Last 3 Encounters:  11/19/19 280 lb (127 kg)  10/22/19 276 lb (125.2 kg)  10/16/19 276 lb (125.2 kg)     GEN:  Well nourished, well developed in no acute distress HEENT: Normal NECK: No JVD; No carotid bruits LYMPHATICS: No lymphadenopathy CARDIAC: RRR, no murmurs, rubs, gallops RESPIRATORY:  Clear to auscultation without rales, wheezing or rhonchi  ABDOMEN: Soft, non-tender, non-distended MUSCULOSKELETAL: Trace edema at ankles; No deformity  SKIN: Warm and dry NEUROLOGIC:  Alert and oriented x 3 PSYCHIATRIC:  Normal affect   ASSESSMENT:    1. Dyspnea on exertion   2. Essential hypertension   3. Edema of both lower extremities   4. Morbid obesity ([place])    PLAN:    In order of problems listed above:  1. Patient with dyspnea on exertion.  She is also morbidly obese.  She denies chest pain.  Her symptoms could be secondary to deconditioning or structural heart disease.   Will evaluate with echocardiogram.  Obstructive sleep apnea could also be contributing.  Patient is currently being evaluated with sleep study by pulmonology.  2. Blood pressure still elevated.  Start losartan 50 mg daily, increase HCTZ to 25 mg daily.  Stop amlodipine as this might be contributing to edema.  If blood pressure not adequately controlled on follow-up visit, we will plan to increase losartan dose.  3. Edema in the lower extremities could be secondary to medication side effect of amlodipine.  We will stop amlodipine.  Echocardiogram as above to evaluate cardiac function.  4. Patient is morbidly obese.  Weight loss advised.  Follow-up after echocardiogram.  This note was generated in part or whole with voice recognition software. Voice recognition is usually quite accurate but there are transcription errors that can and very often do occur. I apologize for any typographical errors that were not detected and corrected.  Medication Adjustments/Labs and Tests Ordered: Current medicines are reviewed at length with the patient today.  Concerns regarding medicines are outlined above.  Orders Placed This Encounter  Procedures  . EKG 12-Lead  . ECHOCARDIOGRAM COMPLETE   Meds ordered this encounter  Medications  . losartan (COZAAR) 50 MG tablet    Sig: Take 1 tablet (50 mg total) by mouth daily.    Dispense:  90 tablet    Refill:  3  . hydrochlorothiazide (HYDRODIURIL) 25 MG tablet    Sig: Take 1 tablet (25 mg total) by mouth daily.    Dispense:  90 tablet    Refill:  3    Patient Instructions  Medication Instructions:  - Your physician has recommended you make the following change in your medication:   1) Increase hydrochlorothiazide (HCTZ) to 25 mg- take 1 tablet (25 mg) by mouth once a day  2) Start cozaar (losartan) 50 mg - take 1 tablet (50 mg) by mouth once a day  3) Stop norvasc (amlodipine)  *If you need a refill on your cardiac medications before your next  appointment, please call your pharmacy*  Lab Work: - none ordered  If you have  labs (blood work) drawn today and your tests are completely normal, you will receive your results only by: Marland Kitchen MyChart Message (if you have MyChart) OR . A paper copy in the mail If you have any lab test that is abnormal or we need to change your treatment, we will call you to review the results.  Testing/Procedures: - Your physician has requested that you have an echocardiogram. Echocardiography is a painless test that uses sound waves to create images of your heart. It provides your doctor with information about the size and shape of your heart and how well your heart's chambers and valves are working. This procedure takes approximately one hour. There are no restrictions for this procedure. An IV may need to be started during your exam to inject an image enhancing agent. Please be sure to drink some fluids prior to coming for your test.   Follow-Up: At [facility], you and your health needs are our priority.  As part of our continuing mission to provide you with exceptional heart care, we have created designated Provider Care Teams.  These Care Teams include your primary Cardiologist (physician) and Advanced Practice Providers (APPs -  Physician Assistants and Nurse Practitioners) who all work together to provide you with the care you need, when you need it.  Your next appointment:   6 weeks/ after echo is completed   The format for your next appointment:   In Person  Provider:   Kate Sable, MD  Other Instructions N/a   Echocardiogram An echocardiogram is a procedure that uses painless sound waves (ultrasound) to produce an image of the heart. Images from an echocardiogram can provide important information about:  Signs of coronary artery disease (CAD).  Aneurysm detection. An aneurysm is a weak or damaged part of an artery wall that bulges out from the normal force of blood pumping through the  body.  Heart size and shape. Changes in the size or shape of the heart can be associated with certain conditions, including heart failure, aneurysm, and CAD.  Heart muscle function.  Heart valve function.  Signs of a past heart attack.  Fluid buildup around the heart.  Thickening of the heart muscle.  A tumor or infectious growth around the heart valves. Tell a health care provider about:  Any allergies you have.  All medicines you are taking, including vitamins, herbs, eye drops, creams, and over-the-counter medicines.  Any blood disorders you have.  Any surgeries you have had.  Any medical conditions you have.  Whether you are pregnant or may be pregnant. What are the risks? Generally, this is a safe procedure. However, problems may occur, including:  Allergic reaction to dye (contrast) that may be used during the procedure. What happens before the procedure? No specific preparation is needed. You may eat and drink normally. What happens during the procedure?   An IV tube may be inserted into one of your veins.  You may receive contrast through this tube. A contrast is an injection that improves the quality of the pictures from your heart.  A gel will be applied to your chest.  A wand-like tool (transducer) will be moved over your chest. The gel will help to transmit the sound waves from the transducer.  The sound waves will harmlessly bounce off of your heart to allow the heart images to be captured in real-time motion. The images will be recorded on a computer. The procedure may vary among health care providers and hospitals. What happens after the  procedure?  You may return to your normal, everyday life, including diet, activities, and medicines, unless your health care provider tells you not to do that. Summary  An echocardiogram is a procedure that uses painless sound waves (ultrasound) to produce an image of the heart.  Images from an echocardiogram can  provide important information about the size and shape of your heart, heart muscle function, heart valve function, and fluid buildup around your heart.  You do not need to do anything to prepare before this procedure. You may eat and drink normally.  After the echocardiogram is completed, you may return to your normal, everyday life, unless your health care provider tells you not to do that. This information is not intended to replace advice given to you by your health care provider. Make sure you discuss any questions you have with your health care provider. Document Revised: 01/21/2019 Document Reviewed: 11/02/2016 Elsevier Patient Education  2020 [employer].      Signed, Kate Sable, MD  11/19/2019 5:17 PM    [place]

## 2019-11-19 NOTE — Patient Instructions (Addendum)
Medication Instructions:  - Your physician has recommended you make the following change in your medication:   1) Increase hydrochlorothiazide (HCTZ) to 25 mg- take 1 tablet (25 mg) by mouth once a day  2) Start cozaar (losartan) 50 mg - take 1 tablet (50 mg) by mouth once a day  3) Stop norvasc (amlodipine)  *If you need a refill on your cardiac medications before your next appointment, please call your pharmacy*  Lab Work: - none ordered  If you have labs (blood work) drawn today and your tests are completely normal, you will receive your results only by: Marland Kitchen MyChart Message (if you have MyChart) OR . A paper copy in the mail If you have any lab test that is abnormal or we need to change your treatment, we will call you to review the results.  Testing/Procedures: - Your physician has requested that you have an echocardiogram. Echocardiography is a painless test that uses sound waves to create images of your heart. It provides your doctor with information about the size and shape of your heart and how well your heart's chambers and valves are working. This procedure takes approximately one hour. There are no restrictions for this procedure. An IV may need to be started during your exam to inject an image enhancing agent. Please be sure to drink some fluids prior to coming for your test.   Follow-Up: At [facility], you and your health needs are our priority.  As part of our continuing mission to provide you with exceptional heart care, we have created designated Provider Care Teams.  These Care Teams include your primary Cardiologist (physician) and Advanced Practice Providers (APPs -  Physician Assistants and Nurse Practitioners) who all work together to provide you with the care you need, when you need it.  Your next appointment:   6 weeks/ after echo is completed   The format for your next appointment:   In Person  Provider:   Kate Sable, MD  Other  Instructions N/a   Echocardiogram An echocardiogram is a procedure that uses painless sound waves (ultrasound) to produce an image of the heart. Images from an echocardiogram can provide important information about:  Signs of coronary artery disease (CAD).  Aneurysm detection. An aneurysm is a weak or damaged part of an artery wall that bulges out from the normal force of blood pumping through the body.  Heart size and shape. Changes in the size or shape of the heart can be associated with certain conditions, including heart failure, aneurysm, and CAD.  Heart muscle function.  Heart valve function.  Signs of a past heart attack.  Fluid buildup around the heart.  Thickening of the heart muscle.  A tumor or infectious growth around the heart valves. Tell a health care provider about:  Any allergies you have.  All medicines you are taking, including vitamins, herbs, eye drops, creams, and over-the-counter medicines.  Any blood disorders you have.  Any surgeries you have had.  Any medical conditions you have.  Whether you are pregnant or may be pregnant. What are the risks? Generally, this is a safe procedure. However, problems may occur, including:  Allergic reaction to dye (contrast) that may be used during the procedure. What happens before the procedure? No specific preparation is needed. You may eat and drink normally. What happens during the procedure?   An IV tube may be inserted into one of your veins.  You may receive contrast through this tube. A contrast is an injection  that improves the quality of the pictures from your heart.  A gel will be applied to your chest.  A wand-like tool (transducer) will be moved over your chest. The gel will help to transmit the sound waves from the transducer.  The sound waves will harmlessly bounce off of your heart to allow the heart images to be captured in real-time motion. The images will be recorded on a computer. The  procedure may vary among health care providers and hospitals. What happens after the procedure?  You may return to your normal, everyday life, including diet, activities, and medicines, unless your health care provider tells you not to do that. Summary  An echocardiogram is a procedure that uses painless sound waves (ultrasound) to produce an image of the heart.  Images from an echocardiogram can provide important information about the size and shape of your heart, heart muscle function, heart valve function, and fluid buildup around your heart.  You do not need to do anything to prepare before this procedure. You may eat and drink normally.  After the echocardiogram is completed, you may return to your normal, everyday life, unless your health care provider tells you not to do that. This information is not intended to replace advice given to you by your health care provider. Make sure you discuss any questions you have with your health care provider. Document Revised: 01/21/2019 Document Reviewed: 11/02/2016 Elsevier Patient Education  [place].

## 2019-11-22 ENCOUNTER — Other Ambulatory Visit: Payer: Self-pay

## 2019-11-22 DIAGNOSIS — G4733 Obstructive sleep apnea (adult) (pediatric): Secondary | ICD-10-CM | POA: Diagnosis not present

## 2019-11-24 ENCOUNTER — Telehealth: Payer: Self-pay

## 2019-11-24 NOTE — Telephone Encounter (Signed)
Called pt to inform her of sleep study results. Recommendations: 1. Avoid driving while feeling drowsy. 2. Follow up with referring physician. 3. Clinical correlation is needed. 4. There is no indication of treatment of sleep disordered breathing. Most patients with sleep parameters demonstrated in this study will not complain of excessive daytime sleepiness represented by Epworth sleepiness scale. Consequently, investigations of additional causes of daytime sleepiness may be appropriate. I asked if we could schedule a follow up appointment and the pt stated she would call back to schedule because she had other appointments to tend to. Nothing further needed.

## 2019-12-02 ENCOUNTER — Other Ambulatory Visit: Payer: Self-pay

## 2019-12-02 NOTE — Progress Notes (Signed)
Patient is set up for B-12 as well as prevnar 12/22/19.

## 2019-12-06 ENCOUNTER — Telehealth: Payer: Self-pay | Admitting: Family

## 2019-12-06 NOTE — Telephone Encounter (Signed)
Call pt Her mammogram is complete She needs:    Right breast ultrasound is recommended; order is in chart.  Please advise her to call [place] and have her schedule breast US right and also dexa

## 2019-12-08 ENCOUNTER — Other Ambulatory Visit: Payer: Self-pay

## 2019-12-08 ENCOUNTER — Encounter: Payer: Self-pay | Admitting: Family

## 2019-12-08 ENCOUNTER — Ambulatory Visit (INDEPENDENT_AMBULATORY_CARE_PROVIDER_SITE_OTHER): Payer: Medicare HMO | Admitting: Family

## 2019-12-08 VITALS — Ht 64.0 in | Wt 283.0 lb

## 2019-12-08 DIAGNOSIS — R059 Cough, unspecified: Secondary | ICD-10-CM

## 2019-12-08 DIAGNOSIS — N898 Other specified noninflammatory disorders of vagina: Secondary | ICD-10-CM

## 2019-12-08 DIAGNOSIS — I10 Essential (primary) hypertension: Secondary | ICD-10-CM

## 2019-12-08 DIAGNOSIS — R05 Cough: Secondary | ICD-10-CM

## 2019-12-08 MED ORDER — METRONIDAZOLE 500 MG PO TABS: 500.0000 mg | ORAL_TABLET | Freq: Two times a day (BID) | ORAL | 0 refills | Status: DC

## 2019-12-08 NOTE — Assessment & Plan Note (Signed)
Will treat empirically for BV. If not total improvement, patient will let me know.

## 2019-12-08 NOTE — Assessment & Plan Note (Signed)
Chronic, recently has been started on Dexilant and Pepcid, following gastroenterology.  I think is appropriate to give these medications more time.  She is also pending echocardiogram with cardiology,   will discuss empiric trial of Flonase at follow-up if no improvement or referral back to pulmonology

## 2019-12-08 NOTE — Assessment & Plan Note (Addendum)
BP Readings from Last 3 Encounters:  11/19/19 140/88  10/16/19 121/68  10/11/19 (!) 154/92   Advised her to check blood pressure at home as well to ensure her regimen is appropriate for her.  I added on a BMP lab when she comes in the office in a couple weeks since she has recently started hydrochlorothiazide.

## 2019-12-08 NOTE — Telephone Encounter (Signed)
Pt saw pcp today °

## 2019-12-08 NOTE — Progress Notes (Signed)
Virtual Visit via Video Note  I connected with@  on 12/08/19 at  4:00 PM EST by a video enabled telemedicine application and verified that I am speaking with the correct person using two identifiers.  Location patient: home Location provider:work  Persons participating in the virtual visit: patient, provider  I discussed the limitations of evaluation and management by telemedicine and the availability of in person appointments. The patient expressed understanding and agreed to proceed.   HPI: Continues to complain of excessive vaginal discharge, brown in color for several weeks. Odorous.  H/o hysterectomy. No vaginal itching, dysuria.  No concern for STDS. No fever, pelvic pain.  She is not sexually active  Continues to complain chronic cough, largely unchanged,, when lays down at night. She doesn't cough during the day.  Some nausea however she thinks is related to medication on an empty stomach.  No regurgitation. no associated with exertional chest pain or pressure, numbness or tingling radiating to left arm or jaw, palpitations, dizziness, frequent headaches, changes in vision, or shortness of breath.  Chest x-ray January 2021 no acute cardiopulmonary disease  Seeing [facility] for GI for cough, diarrhea. Started dexilant and pepcid 11/19/19.   HTN-compliant with 50 mg Losartan, hctz. . Has not checked blood pressure at home  Right breast ultrasound canceled due to weather. She plans to call [facility] for [place] and DEXA  Has seen Dr Charlestine Night- discontinued amlodipine and started her on HCTZ. Pending echo.  Normal sleep study.   ROS: See pertinent positives and negatives per HPI.  Past Medical History:  Diagnosis Date  . Anginal pain ([place])     3/8-12/21/10  . Arthritis    Osteoarthritis in BLE knee  . Colon polyp   . GERD (gastroesophageal reflux disease)   . Hiatal hernia   . History of hiatal hernia   . Hypertension    controlled  . Obesity   . Reflux   . Sleep apnea    NO CPAP  .  Small vessel disease [facility])     Past Surgical History:  Procedure Laterality Date  . ABDOMINAL HYSTERECTOMY     partial; per patient has left ovary  . CHOLECYSTECTOMY    . COLONOSCOPY W/ POLYPECTOMY     adenomatous colon polyp  . COLONOSCOPY WITH PROPOFOL N/A 12/10/2017   Procedure: COLONOSCOPY WITH PROPOFOL;  Surgeon: Manya Silvas, MD;  Location: [facility] ENDOSCOPY;  Service: Endoscopy;  Laterality: N/A;  . ESOPHAGOGASTRODUODENOSCOPY (EGD) WITH PROPOFOL N/A 10/11/2015   Procedure: ESOPHAGOGASTRODUODENOSCOPY (EGD) WITH PROPOFOL;  Surgeon: Manya Silvas, MD;  Location: [facility] ENDOSCOPY;  Service: Endoscopy;  Laterality: N/A;  . ESOPHAGOGASTRODUODENOSCOPY (EGD) WITH PROPOFOL N/A 12/10/2017   Procedure: ESOPHAGOGASTRODUODENOSCOPY (EGD) WITH PROPOFOL;  Surgeon: Manya Silvas, MD;  Location: [facility] ENDOSCOPY;  Service: Endoscopy;  Laterality: N/A;  . JOINT REPLACEMENT Right    knee, Oct 2012  . JOINT REPLACEMENT     hopefully getting left partial knee replacement in July 2016  . SAVORY DILATION N/A 10/11/2015   Procedure: SAVORY DILATION;  Surgeon: Manya Silvas, MD;  Location: [facility] ENDOSCOPY;  Service: Endoscopy;  Laterality: N/A;  . SHOULDER ARTHROSCOPY WITH ROTATOR CUFF REPAIR AND SUBACROMIAL DECOMPRESSION Right 06/14/2015   Procedure: SHOULDER ARTHROSCOPY WITH mini open ROTATOR CUFF REPAIR AND SUBACROMIAL DECOMPRESSION, release long head biceps tendon.;  Surgeon: Leanor Kail, MD;  Location: [facility] ORS;  Service: Orthopedics;  Laterality: Right;    Family History  Problem Relation Age of Onset  . Alcoholism Other  brother  . Arthritis Other        parent  . Breast cancer Other        mother and grandmother  . Hyperlipidemia Father   . Heart disease Father   . Stroke Father   . Hypertension Father   . Diabetes Other        parent and other family member  . Breast cancer Mother 5  . Breast cancer Maternal Grandmother        young    Current Outpatient Medications:   .  ACIPHEX 20 MG tablet, TAKE 1 TABLET (20 MG TOTAL) BY MOUTH DAILY. TAKE 1 TABLET TWICE A DAY., Disp: 30 tablet, Rfl: 7 .  cyanocobalamin (,VITAMIN B-12,) 1000 MCG/ML injection, 1000 mcg (1 mg) injection once per week for four weeks, followed by 1000 mcg injection once per month., Disp: 1 mL, Rfl: 15 .  FLUoxetine (PROZAC) 20 MG tablet, Take 1 tablet (20 mg total) by mouth daily., Disp: 90 tablet, Rfl: 1 .  hydrochlorothiazide (HYDRODIURIL) 25 MG tablet, Take 1 tablet (25 mg total) by mouth daily., Disp: 90 tablet, Rfl: 3 .  losartan (COZAAR) 50 MG tablet, Take 1 tablet (50 mg total) by mouth daily., Disp: 90 tablet, Rfl: 3 .  meloxicam (MOBIC) 15 MG tablet, Take 15 mg by mouth daily., Disp: , Rfl:  .  traMADol (ULTRAM) 50 MG tablet, TAKE 1 TABLET (50 MG TOTAL) BY MOUTH EVERY 6 (SIX) HOURS AS NEEDED FOR PAIN FOR UP TO 30 DOSES, Disp: , Rfl:  .  Vitamin D, Ergocalciferol, (DRISDOL) 1.25 MG (50000 UT) CAPS capsule, Take 1 capsule (50,000 Units total) by mouth every 7 (seven) days., Disp: 12 capsule, Rfl: 0 .  metroNIDAZOLE (FLAGYL) 500 MG tablet, Take 1 tablet (500 mg total) by mouth 2 (two) times daily., Disp: 14 tablet, Rfl: 0  EXAM:  VITALS per patient if applicable:  GENERAL: alert, oriented, appears well and in no acute distress  HEENT: atraumatic, conjunttiva clear, no obvious abnormalities on inspection of external nose and ears  NECK: normal movements of the head and neck  LUNGS: on inspection no signs of respiratory distress, breathing rate appears normal, no obvious gross SOB, gasping or wheezing  CV: no obvious cyanosis  MS: moves all visible extremities without noticeable abnormality  PSYCH/NEURO: pleasant and cooperative, no obvious depression or anxiety, speech and thought processing grossly intact  ASSESSMENT AND PLAN:  Discussed the following assessment and plan:  Vaginal discharge - Plan: metroNIDAZOLE (FLAGYL) 500 MG tablet, Urine cytology ancillary only([facility])  Essential hypertension - Plan: Basic metabolic panel  Cough Problem List Items Addressed This Visit      Cardiovascular and Mediastinum   Essential hypertension    BP Readings from Last 3 Encounters:  11/19/19 140/88  10/16/19 121/68  10/11/19 (!) 154/92   Advised her to check blood pressure at home as well to ensure her regimen is appropriate for her.  I added on a BMP lab when she comes in the office in a couple weeks since she has recently started hydrochlorothiazide.      Relevant Orders   Basic metabolic panel     Other   Cough    Chronic, recently has been started on Dexilant and Pepcid, following gastroenterology.  I think is appropriate to give these medications more time.  She is also pending echocardiogram with cardiology,   will discuss empiric trial of Flonase at follow-up if no improvement or referral back to pulmonology  Vaginal discharge - Primary    Will treat empirically for BV. If not total improvement, patient will let me know.       Relevant Medications   metroNIDAZOLE (FLAGYL) 500 MG tablet   Other Relevant Orders   Urine cytology ancillary only([place])      -we discussed possible serious and likely etiologies, options for evaluation and workup, limitations of telemedicine visit vs in person visit, treatment, treatment risks and precautions. Pt prefers to treat via telemedicine empirically rather then risking or undertaking an in person visit at this moment. Patient agrees to seek prompt in person care if worsening, new symptoms arise, or if is not improving with treatment.   I discussed the assessment and treatment plan with the patient. The patient was provided an opportunity to ask questions and all were answered. The patient agreed with the plan and demonstrated an understanding of the instructions.   The patient was advised to call back or seek an in-person evaluation if the symptoms worsen or if the condition fails to improve as  anticipated.   Mable Paris, FNP

## 2019-12-10 ENCOUNTER — Other Ambulatory Visit: Payer: Self-pay | Admitting: Internal Medicine

## 2019-12-16 ENCOUNTER — Other Ambulatory Visit: Payer: Self-pay | Admitting: Family

## 2019-12-16 DIAGNOSIS — I10 Essential (primary) hypertension: Secondary | ICD-10-CM

## 2019-12-20 ENCOUNTER — Ambulatory Visit (INDEPENDENT_AMBULATORY_CARE_PROVIDER_SITE_OTHER): Payer: Medicare HMO

## 2019-12-20 ENCOUNTER — Other Ambulatory Visit: Payer: Self-pay

## 2019-12-20 DIAGNOSIS — R0609 Other forms of dyspnea: Secondary | ICD-10-CM

## 2019-12-20 DIAGNOSIS — R0600 Dyspnea, unspecified: Secondary | ICD-10-CM | POA: Diagnosis not present

## 2019-12-22 ENCOUNTER — Ambulatory Visit: Payer: Medicare HMO

## 2019-12-22 ENCOUNTER — Other Ambulatory Visit: Payer: Medicare HMO

## 2019-12-28 ENCOUNTER — Ambulatory Visit (INDEPENDENT_AMBULATORY_CARE_PROVIDER_SITE_OTHER): Payer: Medicare HMO

## 2019-12-28 ENCOUNTER — Other Ambulatory Visit: Payer: Self-pay

## 2019-12-28 ENCOUNTER — Other Ambulatory Visit: Payer: Self-pay | Admitting: *Deleted

## 2019-12-28 ENCOUNTER — Encounter: Payer: Self-pay | Admitting: Family

## 2019-12-28 ENCOUNTER — Other Ambulatory Visit: Payer: Medicare HMO

## 2019-12-28 DIAGNOSIS — E538 Deficiency of other specified B group vitamins: Secondary | ICD-10-CM | POA: Diagnosis not present

## 2019-12-28 DIAGNOSIS — Z23 Encounter for immunization: Secondary | ICD-10-CM

## 2019-12-28 DIAGNOSIS — N898 Other specified noninflammatory disorders of vagina: Secondary | ICD-10-CM

## 2019-12-28 MED ORDER — CYANOCOBALAMIN 1000 MCG/ML IJ SOLN
1000.0000 ug | Freq: Once | INTRAMUSCULAR | Status: AC
  Administered 2019-12-28: 1000 ug via INTRAMUSCULAR

## 2019-12-28 MED ORDER — FLUTICASONE PROPIONATE 50 MCG/ACT NA SUSP: 2.0000 | Freq: Every day | NASAL | 5 refills | Status: DC

## 2019-12-28 NOTE — Progress Notes (Addendum)
Patient came in today for B-12 injection in Right Deltoid, IM. She also received Prevnar-13 in Left Deltoid, IM. Patient tolerated well.  Agree with plan. Mable Paris, NP

## 2019-12-28 NOTE — Addendum Note (Signed)
Addended by: Leeanne Rio on: 12/28/2019 11:10 AM   Modules accepted: Orders

## 2019-12-30 DIAGNOSIS — M170 Bilateral primary osteoarthritis of knee: Secondary | ICD-10-CM | POA: Diagnosis not present

## 2019-12-30 DIAGNOSIS — Z6841 Body Mass Index (BMI) 40.0 and over, adult: Secondary | ICD-10-CM | POA: Diagnosis not present

## 2019-12-30 DIAGNOSIS — Z96653 Presence of artificial knee joint, bilateral: Secondary | ICD-10-CM | POA: Diagnosis not present

## 2019-12-30 DIAGNOSIS — I776 Arteritis, unspecified: Secondary | ICD-10-CM | POA: Diagnosis not present

## 2019-12-31 ENCOUNTER — Ambulatory Visit: Payer: Medicare HMO | Admitting: Cardiology

## 2020-01-04 ENCOUNTER — Ambulatory Visit: Payer: Medicare HMO

## 2020-01-06 ENCOUNTER — Ambulatory Visit (INDEPENDENT_AMBULATORY_CARE_PROVIDER_SITE_OTHER): Payer: Medicare HMO | Admitting: *Deleted

## 2020-01-06 ENCOUNTER — Other Ambulatory Visit: Payer: Self-pay

## 2020-01-06 DIAGNOSIS — E538 Deficiency of other specified B group vitamins: Secondary | ICD-10-CM

## 2020-01-07 DIAGNOSIS — E538 Deficiency of other specified B group vitamins: Secondary | ICD-10-CM | POA: Diagnosis not present

## 2020-01-07 MED ORDER — CYANOCOBALAMIN 1000 MCG/ML IJ SOLN
1000.0000 ug | Freq: Once | INTRAMUSCULAR | Status: AC
  Administered 2020-01-07: 1000 ug via INTRAMUSCULAR

## 2020-01-07 NOTE — Progress Notes (Signed)
Patient presented for B 12 injection to left deltoid, patient voiced no concerns nor showed any signs of distress during injection. 

## 2020-01-17 ENCOUNTER — Ambulatory Visit: Payer: Medicare HMO | Admitting: Cardiology

## 2020-01-18 ENCOUNTER — Encounter: Payer: Self-pay | Admitting: Cardiology

## 2020-01-21 DIAGNOSIS — M1712 Unilateral primary osteoarthritis, left knee: Secondary | ICD-10-CM | POA: Diagnosis not present

## 2020-01-24 ENCOUNTER — Telehealth: Payer: Self-pay | Admitting: Family

## 2020-01-24 DIAGNOSIS — N644 Mastodynia: Secondary | ICD-10-CM

## 2020-01-24 NOTE — Telephone Encounter (Signed)
Call pt She is overdue to sch R breast US. Please ask her to call [facility] and schedule this

## 2020-01-25 NOTE — Telephone Encounter (Signed)
Patient called & notified that she can call [facility] to schedule.

## 2020-02-01 ENCOUNTER — Ambulatory Visit: Payer: Medicare HMO

## 2020-02-08 NOTE — Telephone Encounter (Signed)
Can this be reordered to include left breast as well?

## 2020-02-08 NOTE — Telephone Encounter (Signed)
Pt said she needs the order to be for both breasts and not just the right breast. The left breast is aching and throbbing. She would like this one checked out when she goes to get ultrasound. She also would like a call back.

## 2020-02-09 ENCOUNTER — Other Ambulatory Visit: Payer: Self-pay | Admitting: Family

## 2020-02-09 DIAGNOSIS — N644 Mastodynia: Secondary | ICD-10-CM

## 2020-02-09 NOTE — Telephone Encounter (Signed)
Call pt  Left breast US ordered. Has she scheduled ultrasounds of both breasts?    Please sch follow up so we can discuss images and if they require follow up with a breast surgeon.

## 2020-02-09 NOTE — Addendum Note (Signed)
Addended by: Burnard Hawthorne on: 02/09/2020 11:16 AM   Modules accepted: Orders

## 2020-02-09 NOTE — Telephone Encounter (Signed)
noted 

## 2020-02-09 NOTE — Telephone Encounter (Signed)
Just FYI I moved up patient's appointment. She is dealing with a lot of depression due to family situations & losing her job this coming September. Patient will call [facility] to scheduled & I have moved her appointment with you up to first available 5/7.

## 2020-02-18 ENCOUNTER — Ambulatory Visit
Admission: RE | Admit: 2020-02-18 | Discharge: 2020-02-18 | Disposition: A | Discharge status: Home / Self Care | Payer: Medicare HMO | Source: Ambulatory Visit | Attending: Family | Admitting: Family

## 2020-02-18 ENCOUNTER — Encounter: Payer: Self-pay | Admitting: Family

## 2020-02-18 ENCOUNTER — Telehealth (INDEPENDENT_AMBULATORY_CARE_PROVIDER_SITE_OTHER): Payer: Medicare HMO | Admitting: Family

## 2020-02-18 VITALS — Ht 64.0 in | Wt 289.0 lb

## 2020-02-18 DIAGNOSIS — R0789 Other chest pain: Secondary | ICD-10-CM | POA: Insufficient documentation

## 2020-02-18 DIAGNOSIS — N644 Mastodynia: Secondary | ICD-10-CM

## 2020-02-18 DIAGNOSIS — M545 Low back pain, unspecified: Secondary | ICD-10-CM

## 2020-02-18 DIAGNOSIS — E669 Obesity, unspecified: Secondary | ICD-10-CM

## 2020-02-18 DIAGNOSIS — I10 Essential (primary) hypertension: Secondary | ICD-10-CM | POA: Diagnosis not present

## 2020-02-18 DIAGNOSIS — K449 Diaphragmatic hernia without obstruction or gangrene: Secondary | ICD-10-CM | POA: Insufficient documentation

## 2020-02-18 DIAGNOSIS — N6310 Unspecified lump in the right breast, unspecified quadrant: Secondary | ICD-10-CM | POA: Insufficient documentation

## 2020-02-18 DIAGNOSIS — I776 Arteritis, unspecified: Secondary | ICD-10-CM | POA: Insufficient documentation

## 2020-02-18 DIAGNOSIS — F331 Major depressive disorder, recurrent, moderate: Secondary | ICD-10-CM

## 2020-02-18 DIAGNOSIS — R32 Unspecified urinary incontinence: Secondary | ICD-10-CM

## 2020-02-18 DIAGNOSIS — R69 Illness, unspecified: Secondary | ICD-10-CM | POA: Diagnosis not present

## 2020-02-18 DIAGNOSIS — R928 Other abnormal and inconclusive findings on diagnostic imaging of breast: Secondary | ICD-10-CM | POA: Diagnosis not present

## 2020-02-18 MED ORDER — BUPROPION HCL ER (XL) 150 MG PO TB24: ORAL_TABLET | ORAL | 3 refills | Status: DC

## 2020-02-18 NOTE — Assessment & Plan Note (Signed)
Worsening.  Wean off Prozac due to weight gain, start Wellbutrin.

## 2020-02-18 NOTE — Assessment & Plan Note (Signed)
BP Readings from Last 3 Encounters:  11/19/19 140/88  10/16/19 121/68  10/11/19 (!) 154/92   Advised to keep blood pressure log at home as not sure patient is quite controlled.  Also advised her to come in the office for follow-up so we can check here as well.

## 2020-02-18 NOTE — Assessment & Plan Note (Addendum)
Chronic.  We agreed likely exacerbated by 40+ lb weight gain.  Reiterated importance of coming in the office for follow-up to be can assess her back and discuss whether images are appropriate.  patient will continue as needed meloxicam and tramadol.  We discussed the importance of focus on weight gain.  Patient agrees this plan.  Start Wellbutrin

## 2020-02-18 NOTE — Patient Instructions (Addendum)
Wean off prozac and start 10mg  prozac daily for the next week. After one week, please take prozac 10mg  every other day for one week and then STOP.   Start wellbutrin 150mg 

## 2020-02-18 NOTE — Progress Notes (Signed)
Virtual Visit via Video Note  I connected with@  on 02/18/20 at 11:00 AM EDT by a video enabled telemedicine application and verified that I am speaking with the correct person using two identifiers.  Location patient: home Location provider:work  Persons participating in the virtual visit: patient, provider  I discussed the limitations of evaluation and management by telemedicine and the availability of in person appointments. The patient expressed understanding and agreed to proceed.   HPI: Multiple complaints 51 and 79 year old grandchildren are present in video.  Low back pain,  Worse with standing and pain with walking.  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 Complains of worse depression and unhappiness with weight. Gained about 40+  lbs in last year. No anxiety. Food is comforting for her. Describes eating until she is sick. Doesn't 'want to be fat.'  No si/hi. No h/o eating disorder, seizure. No alcohol use.  Lost a son on march 8th 20+ years.  March through Mother's Day is a very hard time every year.  Compliant with prozac, doesn't think helpful.    Left breast diagnostic and left breast ultrasound is scheduled today  Would like referral to urology for urinary incontinence.  HTN- 130 and 140, unsure of lower. Denies exertional chest pain or pressure, numbness or tingling radiating to left arm or jaw, palpitations, dizziness, frequent headaches, changes in vision, or shortness of breath.    ROS: See pertinent positives and negatives per HPI.  Past Medical History:  Diagnosis Date  . Anginal pain ([place])     3/8-12/21/10  . Arthritis    Osteoarthritis in BLE knee  . Colon polyp   . GERD (gastroesophageal reflux disease)   . Hiatal hernia   . History of hiatal hernia   . Hypertension    controlled  . Obesity   . Reflux   . Sleep apnea    NO CPAP  . Small vessel disease [facility])     Past Surgical  History:  Procedure Laterality Date  . ABDOMINAL HYSTERECTOMY     partial; per patient has left ovary  . CHOLECYSTECTOMY    . COLONOSCOPY W/ POLYPECTOMY     adenomatous colon polyp  . COLONOSCOPY WITH PROPOFOL N/A 12/10/2017   Procedure: COLONOSCOPY WITH PROPOFOL;  Surgeon: Manya Silvas, MD;  Location: [facility] ENDOSCOPY;  Service: Endoscopy;  Laterality: N/A;  . ESOPHAGOGASTRODUODENOSCOPY (EGD) WITH PROPOFOL N/A 10/11/2015   Procedure: ESOPHAGOGASTRODUODENOSCOPY (EGD) WITH PROPOFOL;  Surgeon: Manya Silvas, MD;  Location: [facility] ENDOSCOPY;  Service: Endoscopy;  Laterality: N/A;  . ESOPHAGOGASTRODUODENOSCOPY (EGD) WITH PROPOFOL N/A 12/10/2017   Procedure: ESOPHAGOGASTRODUODENOSCOPY (EGD) WITH PROPOFOL;  Surgeon: Manya Silvas, MD;  Location: [facility] ENDOSCOPY;  Service: Endoscopy;  Laterality: N/A;  . JOINT REPLACEMENT Right    knee, Oct 2012  . JOINT REPLACEMENT     hopefully getting left partial knee replacement in July 2016  . SAVORY DILATION N/A 10/11/2015   Procedure: SAVORY DILATION;  Surgeon: Manya Silvas, MD;  Location: [facility] ENDOSCOPY;  Service: Endoscopy;  Laterality: N/A;  . SHOULDER ARTHROSCOPY WITH ROTATOR CUFF REPAIR AND SUBACROMIAL DECOMPRESSION Right 06/14/2015   Procedure: SHOULDER ARTHROSCOPY WITH mini open ROTATOR CUFF REPAIR AND SUBACROMIAL DECOMPRESSION, release long head biceps tendon.;  Surgeon: Leanor Kail, MD;  Location: [facility] ORS;  Service: Orthopedics;  Laterality: Right;    Family History  Problem Relation Age of Onset  . Alcoholism Other        brother  .  Arthritis Other        parent  . Breast cancer Other        mother and grandmother  . Hyperlipidemia Father   . Heart disease Father   . Stroke Father   . Hypertension Father   . Diabetes Other        parent and other family member  . Breast cancer Mother 29  . Breast cancer Maternal Grandmother        young      Current Outpatient Medications:  .  ACIPHEX 20 MG tablet, TAKE 1 TABLET  (20 MG TOTAL) BY MOUTH DAILY. TAKE 1 TABLET TWICE A DAY., Disp: 30 tablet, Rfl: 7 .  fluticasone (FLONASE) 50 MCG/ACT nasal spray, Place 2 sprays into both nostrils daily., Disp: 16 mL, Rfl: 5 .  hydrochlorothiazide (HYDRODIURIL) 25 MG tablet, Take 1 tablet (25 mg total) by mouth daily., Disp: 90 tablet, Rfl: 3 .  losartan (COZAAR) 50 MG tablet, Take 1 tablet (50 mg total) by mouth daily., Disp: 90 tablet, Rfl: 3 .  meloxicam (MOBIC) 15 MG tablet, Take 15 mg by mouth daily., Disp: , Rfl:  .  traMADol (ULTRAM) 50 MG tablet, TAKE 1 TABLET (50 MG TOTAL) BY MOUTH EVERY 6 (SIX) HOURS AS NEEDED FOR PAIN FOR UP TO 30 DOSES, Disp: , Rfl:  .  buPROPion (WELLBUTRIN XL) 150 MG 24 hr tablet, Start 150 mg ER PO qam, increase after 3 days to 300 mg qam., Disp: 60 tablet, Rfl: 3 .  cyanocobalamin (,VITAMIN B-12,) 1000 MCG/ML injection, 1000 mcg (1 mg) injection once per week for four weeks, followed by 1000 mcg injection once per month. (Patient not taking: Reported on 02/18/2020), Disp: 1 mL, Rfl: 15  EXAM:  VITALS per patient if applicable: BP Readings from Last 3 Encounters:  11/19/19 140/88  10/16/19 121/68  10/11/19 (!) 154/92    GENERAL: alert, oriented, appears well and in no acute distress  HEENT: atraumatic, conjunttiva clear, no obvious abnormalities on inspection of external nose and ears  NECK: normal movements of the head and neck  LUNGS: on inspection no signs of respiratory distress, breathing rate appears normal, no obvious gross SOB, gasping or wheezing  CV: no obvious cyanosis  MS: moves all visible extremities without noticeable abnormality  PSYCH/NEURO: pleasant and cooperative, no obvious depression or anxiety, speech and thought processing grossly intact  ASSESSMENT AND PLAN:  Discussed the following assessment and plan:  Essential hypertension - Plan: Comprehensive metabolic panel, Lipid panel  Depression, major, recurrent, moderate (HCC) - Plan: buPROPion (WELLBUTRIN XL)  150 MG 24 hr tablet  Obesity (BMI 35.0-39.9 without comorbidity) - Plan: buPROPion (WELLBUTRIN XL) 150 MG 24 hr tablet, Hemoglobin A1c  Urinary incontinence, unspecified type - Plan: Ambulatory referral to Urology  Low back pain, unspecified back pain laterality, unspecified chronicity, unspecified whether sciatica present Problem List Items Addressed This Visit      Cardiovascular and Mediastinum   Essential hypertension - Primary    BP Readings from Last 3 Encounters:  11/19/19 140/88  10/16/19 121/68  10/11/19 (!) 154/92   Advised to keep blood pressure log at home as not sure patient is quite controlled.  Also advised her to come in the office for follow-up so we can check here as well.       Relevant Orders   Comprehensive metabolic panel   Lipid panel     Other   Depression, major, recurrent, moderate (HCC)    Worsening.  Wean off Prozac due  to weight gain, start Wellbutrin.      Relevant Medications   buPROPion (WELLBUTRIN XL) 150 MG 24 hr tablet   Low back pain    Chronic.  We agreed likely exacerbated by 40+ lb weight gain.  Reiterated importance of coming in the office for follow-up to be can assess her back and discuss whether images are appropriate.  patient will continue as needed meloxicam and tramadol.  We discussed the importance of focus on weight gain.  Patient agrees this plan.  Start Wellbutrin      Obesity (BMI 35.0-39.9 without comorbidity)   Relevant Medications   buPROPion (WELLBUTRIN XL) 150 MG 24 hr tablet   Other Relevant Orders   Hemoglobin A1c    Other Visit Diagnoses    Urinary incontinence, unspecified type       Relevant Orders   Ambulatory referral to Urology      -we discussed possible serious and likely etiologies, options for evaluation and workup, limitations of telemedicine visit vs in person visit, treatment, treatment risks and precautions. Pt prefers to treat via telemedicine empirically rather then risking or undertaking an in  person visit at this moment. Patient agrees to seek prompt in person care if worsening, new symptoms arise, or if is not improving with treatment.   I discussed the assessment and treatment plan with the patient. The patient was provided an opportunity to ask questions and all were answered. The patient agreed with the plan and demonstrated an understanding of the instructions.   The patient was advised to call back or seek an in-person evaluation if the symptoms worsen or if the condition fails to improve as anticipated.   Jacqueline Paris, FNP

## 2020-02-21 NOTE — Telephone Encounter (Signed)
Pt said she needs a prior authorization for Wellbutrin.

## 2020-02-25 ENCOUNTER — Other Ambulatory Visit: Payer: Medicare HMO

## 2020-03-02 ENCOUNTER — Other Ambulatory Visit: Payer: Medicare HMO

## 2020-03-02 NOTE — Addendum Note (Signed)
Addended by: Leeanne Rio on: 03/02/2020 02:54 PM   Modules accepted: Orders

## 2020-03-08 ENCOUNTER — Telehealth: Payer: Medicare HMO | Admitting: Family

## 2020-03-11 ENCOUNTER — Other Ambulatory Visit: Payer: Self-pay | Admitting: Family

## 2020-03-11 DIAGNOSIS — E669 Obesity, unspecified: Secondary | ICD-10-CM

## 2020-03-11 DIAGNOSIS — F331 Major depressive disorder, recurrent, moderate: Secondary | ICD-10-CM

## 2020-03-20 NOTE — Progress Notes (Incomplete)
03/21/20  7:57 PM   Jacqueline Delgado October 12, 1951 [id number]  Referring provider: Burnard Hawthorne, FNP [address] [place],  [place] [address] No chief complaint on file.   HPI: Jacqueline Delgado is a 69 y.o. F who presents today for the evaluation and management of urinary incontinence.       PMH: Past Medical History:  Diagnosis Date  . Anginal pain ([place])     3/8-12/21/10  . Arthritis    Osteoarthritis in BLE knee  . Colon polyp   . GERD (gastroesophageal reflux disease)   . Hiatal hernia   . History of hiatal hernia   . Hypertension    controlled  . Obesity   . Reflux   . Sleep apnea    NO CPAP  . Small vessel disease [facility])     Surgical History: Past Surgical History:  Procedure Laterality Date  . ABDOMINAL HYSTERECTOMY     partial; per patient has left ovary  . CHOLECYSTECTOMY    . COLONOSCOPY W/ POLYPECTOMY     adenomatous colon polyp  . COLONOSCOPY WITH PROPOFOL N/A 12/10/2017   Procedure: COLONOSCOPY WITH PROPOFOL;  Surgeon: Manya Silvas, MD;  Location: [facility] ENDOSCOPY;  Service: Endoscopy;  Laterality: N/A;  . ESOPHAGOGASTRODUODENOSCOPY (EGD) WITH PROPOFOL N/A 10/11/2015   Procedure: ESOPHAGOGASTRODUODENOSCOPY (EGD) WITH PROPOFOL;  Surgeon: Manya Silvas, MD;  Location: [facility] ENDOSCOPY;  Service: Endoscopy;  Laterality: N/A;  . ESOPHAGOGASTRODUODENOSCOPY (EGD) WITH PROPOFOL N/A 12/10/2017   Procedure: ESOPHAGOGASTRODUODENOSCOPY (EGD) WITH PROPOFOL;  Surgeon: Manya Silvas, MD;  Location: [facility] ENDOSCOPY;  Service: Endoscopy;  Laterality: N/A;  . JOINT REPLACEMENT Right    knee, Oct 2012  . JOINT REPLACEMENT     hopefully getting left partial knee replacement in July 2016  . SAVORY DILATION N/A 10/11/2015   Procedure: SAVORY DILATION;  Surgeon: Manya Silvas, MD;  Location: [facility] ENDOSCOPY;  Service: Endoscopy;  Laterality: N/A;  . SHOULDER ARTHROSCOPY WITH ROTATOR CUFF REPAIR AND SUBACROMIAL DECOMPRESSION Right 06/14/2015   Procedure:  SHOULDER ARTHROSCOPY WITH mini open ROTATOR CUFF REPAIR AND SUBACROMIAL DECOMPRESSION, release long head biceps tendon.;  Surgeon: Leanor Kail, MD;  Location: [facility] ORS;  Service: Orthopedics;  Laterality: Right;    Home Medications:  Allergies as of 03/21/2020      Reactions   Ibuprofen Other (See Comments)   ulcer Ulcer ulcer Ulcer ulcer Ulcer Ulcer      Medication List       Accurate as of March 20, 2020  7:57 PM. If you have any questions, ask your nurse or doctor.        Aciphex 20 MG tablet Generic drug: RABEprazole TAKE 1 TABLET (20 MG TOTAL) BY MOUTH DAILY. TAKE 1 TABLET TWICE A DAY.   buPROPion 150 MG 24 hr tablet Commonly known as: WELLBUTRIN XL TAKE 1 EVERY MORNING, THEN INCREASE AFTER 3 DAYS TO 2 EVERY MORNING. (INS ONLY COVERS 1 PER DAY)   cyanocobalamin 1000 MCG/ML injection Commonly known as: (VITAMIN B-12) 1000 mcg (1 mg) injection once per week for four weeks, followed by 1000 mcg injection once per month.   fluticasone 50 MCG/ACT nasal spray Commonly known as: Flonase Place 2 sprays into both nostrils daily.   hydrochlorothiazide 25 MG tablet Commonly known as: HYDRODIURIL Take 1 tablet (25 mg total) by mouth daily.   losartan 50 MG tablet Commonly known as: COZAAR Take 1 tablet (50 mg total) by mouth daily.   meloxicam 15 MG tablet Commonly known as: MOBIC  Take 15 mg by mouth daily.   traMADol 50 MG tablet Commonly known as: ULTRAM TAKE 1 TABLET (50 MG TOTAL) BY MOUTH EVERY 6 (SIX) HOURS AS NEEDED FOR PAIN FOR UP TO 30 DOSES       Allergies:  Allergies  Allergen Reactions  . Ibuprofen Other (See Comments)    ulcer Ulcer ulcer  Ulcer   ulcer Ulcer Ulcer     Family History: Family History  Problem Relation Age of Onset  . Alcoholism Other        brother  . Arthritis Other        parent  . Breast cancer Other        mother and grandmother  . Hyperlipidemia Father   . Heart disease Father   . Stroke Father   .  Hypertension Father   . Diabetes Other        parent and other family member  . Breast cancer Mother 25  . Breast cancer Maternal Grandmother        young    Social History:  reports that she has never smoked. She has never used smokeless tobacco. She reports that she does not drink alcohol or use drugs.   Physical Exam: There were no vitals taken for this visit.  Constitutional:  Alert and oriented, No acute distress. HEENT: [place] AT, moist mucus membranes.  Trachea midline, no masses. Cardiovascular: No clubbing, cyanosis, or edema. Respiratory: Normal respiratory effort, no increased work of breathing. GI: Abdomen is soft, nontender, nondistended, no abdominal masses GU: No CVA tenderness Lymph: No cervical or inguinal lymphadenopathy. Skin: No rashes, bruises or suspicious lesions. Neurologic: Grossly intact, no focal deficits, moving all 4 extremities. Psychiatric: Normal mood and affect.  Laboratory Data:  Lab Results  Component Value Date   CREATININE 0.89 10/16/2019    Urinalysis   Pertinent Imaging: ***   Assessment & Plan:     No follow-ups on file.  [place] [address], [place], [place] [address] [phone]  I, Jacqueline Delgado, am acting as a scribe for Dr. Hollice Espy,  {Add Scribe Attestation Statement}

## 2020-03-21 ENCOUNTER — Ambulatory Visit: Payer: Self-pay | Admitting: Urology

## 2020-03-27 ENCOUNTER — Other Ambulatory Visit: Payer: Self-pay

## 2020-03-27 ENCOUNTER — Ambulatory Visit: Payer: Medicare HMO | Admitting: Urology

## 2020-03-27 ENCOUNTER — Encounter: Payer: Self-pay | Admitting: Urology

## 2020-03-27 VITALS — BP 160/82 | HR 80

## 2020-03-27 DIAGNOSIS — N3946 Mixed incontinence: Secondary | ICD-10-CM | POA: Diagnosis not present

## 2020-03-27 DIAGNOSIS — R319 Hematuria, unspecified: Secondary | ICD-10-CM | POA: Diagnosis not present

## 2020-03-27 DIAGNOSIS — R32 Unspecified urinary incontinence: Secondary | ICD-10-CM | POA: Diagnosis not present

## 2020-03-27 LAB — MICROSCOPIC EXAMINATION

## 2020-03-27 LAB — URINALYSIS, COMPLETE
Bilirubin, UA: NEGATIVE
Glucose, UA: NEGATIVE
Ketones, UA: NEGATIVE
Nitrite, UA: NEGATIVE
Protein,UA: NEGATIVE
RBC, UA: NEGATIVE
Specific Gravity, UA: 1.02 (ref 1.005–1.030)
Urobilinogen, Ur: 0.2 mg/dL (ref 0.2–1.0)
pH, UA: 6 (ref 5.0–7.5)

## 2020-03-27 LAB — BLADDER SCAN AMB NON-IMAGING: Scan Result: 30

## 2020-03-27 MED ORDER — MIRABEGRON ER 50 MG PO TB24
50.0000 mg | ORAL_TABLET | Freq: Every day | ORAL | 11 refills | Status: DC
Start: 2020-03-27 — End: 2020-07-18

## 2020-03-27 NOTE — Progress Notes (Signed)
03/27/2020 10:50 AM   [name] 05/07/1951 [id number]  Referring provider: Burnard Hawthorne, FNP [address] [place],  [place] [address]  No chief complaint on file.   HPI: I was consulted to assist the patient for vaginal discharge and urinary incontinence.  In the middle the night she wakes up with some brownish foul-smelling discharge but she said it smells like urine.  At baseline she leaks with coughing sneezing sometimes bending lifting.  She has urge incontinence especially if she holds it too long.   She does not wear a pad.  Flow sometimes good sometimes less.  Voids every 2 or 3 hours gets up twice at night.  She does feel empty but has dribbling afterwards  Has had a hysterectomy.  I reviewed the chart and 1 out of the last 2 urine cultures in the last several months was positive  No history of kidney stones bladder surgery or bladder infections.  No neurologic issues.  Bowel movements normal  PMH: Past Medical History:  Diagnosis Date   Anginal pain ([place])     3/8-12/21/10   Arthritis    Osteoarthritis in BLE knee   Colon polyp    GERD (gastroesophageal reflux disease)    Hiatal hernia    History of hiatal hernia    Hypertension    controlled   Obesity    Reflux    Sleep apnea    NO CPAP   Small vessel disease ([place])     Surgical History: Past Surgical History:  Procedure Laterality Date   ABDOMINAL HYSTERECTOMY     partial; per patient has left ovary   CHOLECYSTECTOMY     COLONOSCOPY W/ POLYPECTOMY     adenomatous colon polyp   COLONOSCOPY WITH PROPOFOL N/A 12/10/2017   Procedure: COLONOSCOPY WITH PROPOFOL;  Surgeon: Manya Silvas, MD;  Location: [facility] ENDOSCOPY;  Service: Endoscopy;  Laterality: N/A;   ESOPHAGOGASTRODUODENOSCOPY (EGD) WITH PROPOFOL N/A 10/11/2015   Procedure: ESOPHAGOGASTRODUODENOSCOPY (EGD) WITH PROPOFOL;  Surgeon: Manya Silvas, MD;  Location: [facility] ENDOSCOPY;  Service: Endoscopy;  Laterality:  N/A;   ESOPHAGOGASTRODUODENOSCOPY (EGD) WITH PROPOFOL N/A 12/10/2017   Procedure: ESOPHAGOGASTRODUODENOSCOPY (EGD) WITH PROPOFOL;  Surgeon: Manya Silvas, MD;  Location: [facility] ENDOSCOPY;  Service: Endoscopy;  Laterality: N/A;   JOINT REPLACEMENT Right    knee, Oct 2012   JOINT REPLACEMENT     hopefully getting left partial knee replacement in July 2016   SAVORY DILATION N/A 10/11/2015   Procedure: SAVORY DILATION;  Surgeon: Manya Silvas, MD;  Location: [facility] ENDOSCOPY;  Service: Endoscopy;  Laterality: N/A;   SHOULDER ARTHROSCOPY WITH ROTATOR CUFF REPAIR AND SUBACROMIAL DECOMPRESSION Right 06/14/2015   Procedure: SHOULDER ARTHROSCOPY WITH mini open ROTATOR CUFF REPAIR AND SUBACROMIAL DECOMPRESSION, release long head biceps tendon.;  Surgeon: Leanor Kail, MD;  Location: [facility] ORS;  Service: Orthopedics;  Laterality: Right;    Home Medications:  Allergies as of 03/27/2020      Reactions   Ibuprofen Other (See Comments)   ulcer Ulcer ulcer Ulcer ulcer Ulcer Ulcer      Medication List       Accurate as of March 27, 2020 10:50 AM. If you have any questions, ask your nurse or doctor.        Aciphex 20 MG tablet Generic drug: RABEprazole TAKE 1 TABLET (20 MG TOTAL) BY MOUTH DAILY. TAKE 1 TABLET TWICE A DAY.   buPROPion 150 MG 24 hr tablet Commonly known as: WELLBUTRIN XL TAKE 1 EVERY  MORNING, THEN INCREASE AFTER 3 DAYS TO 2 EVERY MORNING. (INS ONLY COVERS 1 PER DAY)   cyanocobalamin 1000 MCG/ML injection Commonly known as: (VITAMIN B-12) 1000 mcg (1 mg) injection once per week for four weeks, followed by 1000 mcg injection once per month.   fluticasone 50 MCG/ACT nasal spray Commonly known as: Flonase Place 2 sprays into both nostrils daily.   hydrochlorothiazide 25 MG tablet Commonly known as: HYDRODIURIL Take 1 tablet (25 mg total) by mouth daily.   losartan 50 MG tablet Commonly known as: COZAAR Take 1 tablet (50 mg total) by mouth daily.   meloxicam 15  MG tablet Commonly known as: MOBIC Take 15 mg by mouth daily.   traMADol 50 MG tablet Commonly known as: ULTRAM TAKE 1 TABLET (50 MG TOTAL) BY MOUTH EVERY 6 (SIX) HOURS AS NEEDED FOR PAIN FOR UP TO 30 DOSES       Allergies:  Allergies  Allergen Reactions   Ibuprofen Other (See Comments)    ulcer Ulcer ulcer  Ulcer   ulcer Ulcer Ulcer     Family History: Family History  Problem Relation Age of Onset   Alcoholism Other        brother   Arthritis Other        parent   Breast cancer Other        mother and grandmother   Hyperlipidemia Father    Heart disease Father    Stroke Father    Hypertension Father    Diabetes Other        parent and other family member   Breast cancer Mother 73   Breast cancer Maternal Grandmother        young    Social History:  reports that she has never smoked. She has never used smokeless tobacco. She reports that she does not drink alcohol and does not use drugs.  ROS:                                        Physical Exam: There were no vitals taken for this visit.  Constitutional:  Alert and oriented, No acute distress. HEENT: [place] AT, moist mucus membranes.  Trachea midline, no masses. Cardiovascular: No clubbing, cyanosis, or edema. Respiratory: Normal respiratory effort, no increased work of breathing. GI: Abdomen is soft, nontender, nondistended, no abdominal masses GU: Grade 1 hypermobility the bladder neck and negative cough test.  No prolapse Skin: No rashes, bruises or suspicious lesions. Lymph: No cervical or inguinal adenopathy. Neurologic: Grossly intact, no focal deficits, moving all 4 extremities. Psychiatric: Normal mood and affect.  Laboratory Data: Lab Results  Component Value Date   WBC 3.5 (L) 10/16/2019   HGB 11.2 (L) 10/16/2019   HCT 36.7 10/16/2019   MCV 80.0 10/16/2019   PLT 196 10/16/2019    Lab Results  Component Value Date   CREATININE 0.89 10/16/2019     No results found for: PSA  No results found for: TESTOSTERONE  Lab Results  Component Value Date   HGBA1C 5.5 09/29/2019    Urinalysis    Component Value Date/Time   COLORURINE YELLOW (A) 10/16/2019 0501   APPEARANCEUR CLEAR (A) 10/16/2019 0501   APPEARANCEUR Hazy 06/09/2014 0754   LABSPEC 1.028 10/16/2019 0501   LABSPEC 1.023 06/09/2014 0754   PHURINE 6.0 10/16/2019 0501   GLUCOSEU NEGATIVE 10/16/2019 0501   GLUCOSEU NEGATIVE 08/04/2019 1012   HGBUR NEGATIVE  10/16/2019 0501   BILIRUBINUR NEGATIVE 10/16/2019 0501   BILIRUBINUR Negative 06/09/2014 0754   KETONESUR NEGATIVE 10/16/2019 0501   PROTEINUR NEGATIVE 10/16/2019 0501   UROBILINOGEN 0.2 08/04/2019 1012   NITRITE NEGATIVE 10/16/2019 0501   LEUKOCYTESUR NEGATIVE 10/16/2019 0501   LEUKOCYTESUR Trace 06/09/2014 0754    Pertinent Imaging: Chart reviewed.  Urine reviewed.  Residual normal.  Urine sent for culture  Assessment & Plan: Patient does have mixed incontinence not wearing pads.  She may have small volume bedwetting.  She has had a hysterectomy.  She has post void dribbling.  Call if urine culture is positive  I will treat as an overactive bladder with mild bedwetting with Myrbetriq 50 mg samples and prescription.  I like to perform cystoscopy for safety reasons in 6 weeks. Patient did not have microscopic hematuria today but says the discharge of the night is brown.  I thought just in case to get a CT hematuria and call if abnormal.  1. Urinary incontinence, unspecified type  - Urinalysis, Complete   No follow-ups on file.  Reece Packer, MD  [facility] [address], [place],  [address] [phone]

## 2020-03-27 NOTE — Patient Instructions (Signed)
Cystoscopy Cystoscopy is a procedure that is used to help diagnose and sometimes treat conditions that affect the lower urinary tract. The lower urinary tract includes the bladder and the urethra. The urethra is the tube that drains urine from the bladder. Cystoscopy is done using a thin, tube-shaped instrument with a light and camera at the end (cystoscope). The cystoscope may be hard or flexible, depending on the goal of the procedure. The cystoscope is inserted through the urethra, into the bladder. Cystoscopy may be recommended if you have:  Urinary tract infections that keep coming back.  Blood in the urine (hematuria).  An inability to control when you urinate (urinary incontinence) or an overactive bladder.  Unusual cells found in a urine sample.  A blockage in the urethra, such as a urinary stone.  Painful urination.  An abnormality in the bladder found during an intravenous pyelogram (IVP) or CT scan. Cystoscopy may also be done to remove a sample of tissue to be examined under a microscope (biopsy). What are the risks? Generally, this is a safe procedure. However, problems may occur, including:  Infection.  Bleeding.  What happens during the procedure?  1. You will be given one or more of the following: ? A medicine to numb the area (local anesthetic). 2. The area around the opening of your urethra will be cleaned. 3. The cystoscope will be passed through your urethra into your bladder. 4. Germ-free (sterile) fluid will flow through the cystoscope to fill your bladder. The fluid will stretch your bladder so that your health care provider can clearly examine your bladder walls. 5. Your doctor will look at the urethra and bladder. 6. The cystoscope will be removed The procedure may vary among health care providers  What can I expect after the procedure? After the procedure, it is common to have: 1. Some soreness or pain in your abdomen and urethra. 2. Urinary symptoms.  These include: ? Mild pain or burning when you urinate. Pain should stop within a few minutes after you urinate. This may last for up to 1 week. ? A small amount of blood in your urine for several days. ? Feeling like you need to urinate but producing only a small amount of urine. Follow these instructions at home: General instructions  Return to your normal activities as told by your health care provider.   Do not drive for 24 hours if you were given a sedative during your procedure.  Watch for any blood in your urine. If the amount of blood in your urine increases, call your health care provider.  If a tissue sample was removed for testing (biopsy) during your procedure, it is up to you to get your test results. Ask your health care provider, or the department that is doing the test, when your results will be ready.  Drink enough fluid to keep your urine pale yellow.  Keep all follow-up visits as told by your health care provider. This is important. Contact a health care provider if you:  Have pain that gets worse or does not get better with medicine, especially pain when you urinate.  Have trouble urinating.  Have more blood in your urine. Get help right away if you:  Have blood clots in your urine.  Have abdominal pain.  Have a fever or chills.  Are unable to urinate. Summary  Cystoscopy is a procedure that is used to help diagnose and sometimes treat conditions that affect the lower urinary tract.  Cystoscopy is done using   a thin, tube-shaped instrument with a light and camera at the end.  After the procedure, it is common to have some soreness or pain in your abdomen and urethra.  Watch for any blood in your urine. If the amount of blood in your urine increases, call your health care provider.  If you were prescribed an antibiotic medicine, take it as told by your health care provider. Do not stop taking the antibiotic even if you start to feel better. This  information is not intended to replace advice given to you by your health care provider. Make sure you discuss any questions you have with your health care provider. Document Revised: 09/22/2018 Document Reviewed: 09/22/2018 Elsevier Patient Education  2020 [employer]   

## 2020-04-02 LAB — CULTURE, URINE COMPREHENSIVE

## 2020-04-03 ENCOUNTER — Telehealth (INDEPENDENT_AMBULATORY_CARE_PROVIDER_SITE_OTHER): Payer: Medicare HMO | Admitting: Family

## 2020-04-03 ENCOUNTER — Encounter: Payer: Self-pay | Admitting: Family

## 2020-04-03 ENCOUNTER — Telehealth: Payer: Self-pay

## 2020-04-03 ENCOUNTER — Ambulatory Visit: Payer: Medicare HMO

## 2020-04-03 VITALS — Ht 64.0 in | Wt 294.0 lb

## 2020-04-03 DIAGNOSIS — E538 Deficiency of other specified B group vitamins: Secondary | ICD-10-CM

## 2020-04-03 DIAGNOSIS — F331 Major depressive disorder, recurrent, moderate: Secondary | ICD-10-CM

## 2020-04-03 DIAGNOSIS — I10 Essential (primary) hypertension: Secondary | ICD-10-CM

## 2020-04-03 DIAGNOSIS — G4733 Obstructive sleep apnea (adult) (pediatric): Secondary | ICD-10-CM

## 2020-04-03 DIAGNOSIS — M5136 Other intervertebral disc degeneration, lumbar region: Secondary | ICD-10-CM

## 2020-04-03 DIAGNOSIS — M25471 Effusion, right ankle: Secondary | ICD-10-CM | POA: Diagnosis not present

## 2020-04-03 DIAGNOSIS — R69 Illness, unspecified: Secondary | ICD-10-CM | POA: Diagnosis not present

## 2020-04-03 MED ORDER — NITROFURANTOIN MONOHYD MACRO 100 MG PO CAPS
100.0000 mg | ORAL_CAPSULE | Freq: Two times a day (BID) | ORAL | 0 refills | Status: DC
Start: 2020-04-03 — End: 2020-04-24

## 2020-04-03 MED ORDER — LOSARTAN POTASSIUM 50 MG PO TABS: 50.0000 mg | ORAL_TABLET | Freq: Every day | ORAL | 1 refills | Status: DC

## 2020-04-03 MED ORDER — BUPROPION HCL ER (XL) 300 MG PO TB24: 300.0000 mg | ORAL_TABLET | Freq: Every morning | ORAL | 2 refills | Status: DC

## 2020-04-03 NOTE — Telephone Encounter (Signed)
As per dr. Matilde Sprang pt will start macrobid 100 mg bid x 7 days. Pt aware. Sent medications to pharmacy

## 2020-04-03 NOTE — Assessment & Plan Note (Signed)
Ordered stat US as limited by assessment due to virtual appointment. Patient offered to go today for [place]; she declines and would like to go next week. She understands any worsening of symptoms or development of heat, redness, calf pain, would warrant emergency room evaluation

## 2020-04-03 NOTE — Assessment & Plan Note (Signed)
Improved on wellbutrin, will continue.

## 2020-04-03 NOTE — Assessment & Plan Note (Signed)
No OSA per last pulmonology note.

## 2020-04-03 NOTE — Assessment & Plan Note (Signed)
Acute on chronic.pending xr , pain management referral.

## 2020-04-03 NOTE — Addendum Note (Signed)
Addended by: Alvera Novel on: 04/03/2020 02:54 PM   Modules accepted: Orders

## 2020-04-03 NOTE — Patient Instructions (Addendum)
It is imperative that you are seen AT least twice per year for labs and monitoring. Monitor blood pressure at home and me 5-6 reading on separate days. Goal is less than 120/80, based on newest guidelines, however we certainly want to be less than 130/80;  if persistently higher, please make sooner follow up appointment so we can recheck you blood pressure and manage/ adjust medications.  Ultrasound of right leg.   Let us know if you dont hear back within a week in regards to an appointment being scheduled.

## 2020-04-03 NOTE — Assessment & Plan Note (Addendum)
Remain concern elevated however upon review of blood pressures, she had values of good control in 10/2019. Advised to try to limit mobic as suspect contributory.  Again asked patient to come in office and will also keep log at home.

## 2020-04-03 NOTE — Assessment & Plan Note (Signed)
Restarting b12 injections since interrupted

## 2020-04-03 NOTE — Progress Notes (Signed)
Virtual Visit via Video Note  I connected with@  on 04/03/20 at  4:00 PM EDT by a video enabled telemedicine application and verified that I am speaking with the correct person using two identifiers.  Location patient: home Location provider:work Persons participating in the virtual visit: patient, provider  I discussed the limitations of evaluation and management by telemedicine and the availability of in person appointments. The patient expressed understanding and agreed to proceed.   HPI: Follow up  Started wellbutrin and feels less 'angry' and feels depression has improved. Not as tearful. No longer on prozac. Cannot tell if helpful in regards to weight loss.  Continues to complain of low back pain 'all the way across'. No buttocks, back numbness.  Pain is okay when sitting, pain when standing. Feels Taking tramadol twice per week without relief of back pain. Helps more with knee pain.   HTN- compliant with medication. Elevated at urology appointment, and doesn't  think cuff was inappropriate size. Taking mobic 15mg  QD. No Cp.  Right top of foot, ankle swollen, there all the time. Doesn't improve with elevated.  No calf pain, calf tenderness, erythema.   No h/o DVT. H/o right TKR.  Intermittent SOB when walking long distances. Over the past year with weight gain, feels SOBOE worse withweight contributory to this. No SOB when talking ,sitting. No palpitations. No OCP.    Consult with Dr Charlestine Night 11/19/19. Normal echocardiogram 12/20/19  Mammogram utd  ROS: See pertinent positives and negatives per HPI.  Past Medical History:  Diagnosis Date  . Anginal pain ([place])     3/8-12/21/10  . Arthritis    Osteoarthritis in BLE knee  . Colon polyp   . GERD (gastroesophageal reflux disease)   . Hiatal hernia   . History of hiatal hernia   . Hypertension    controlled  . Obesity   . Reflux   . Sleep apnea    NO CPAP  . Small vessel disease [facility])     Past Surgical History:   Procedure Laterality Date  . ABDOMINAL HYSTERECTOMY     partial; per patient has left ovary  . CHOLECYSTECTOMY    . COLONOSCOPY W/ POLYPECTOMY     adenomatous colon polyp  . COLONOSCOPY WITH PROPOFOL N/A 12/10/2017   Procedure: COLONOSCOPY WITH PROPOFOL;  Surgeon: Manya Silvas, MD;  Location: [facility] ENDOSCOPY;  Service: Endoscopy;  Laterality: N/A;  . ESOPHAGOGASTRODUODENOSCOPY (EGD) WITH PROPOFOL N/A 10/11/2015   Procedure: ESOPHAGOGASTRODUODENOSCOPY (EGD) WITH PROPOFOL;  Surgeon: Manya Silvas, MD;  Location: [facility] ENDOSCOPY;  Service: Endoscopy;  Laterality: N/A;  . ESOPHAGOGASTRODUODENOSCOPY (EGD) WITH PROPOFOL N/A 12/10/2017   Procedure: ESOPHAGOGASTRODUODENOSCOPY (EGD) WITH PROPOFOL;  Surgeon: Manya Silvas, MD;  Location: [facility] ENDOSCOPY;  Service: Endoscopy;  Laterality: N/A;  . JOINT REPLACEMENT Right    knee, Oct 2012  . JOINT REPLACEMENT     hopefully getting left partial knee replacement in July 2016  . SAVORY DILATION N/A 10/11/2015   Procedure: SAVORY DILATION;  Surgeon: Manya Silvas, MD;  Location: [facility] ENDOSCOPY;  Service: Endoscopy;  Laterality: N/A;  . SHOULDER ARTHROSCOPY WITH ROTATOR CUFF REPAIR AND SUBACROMIAL DECOMPRESSION Right 06/14/2015   Procedure: SHOULDER ARTHROSCOPY WITH mini open ROTATOR CUFF REPAIR AND SUBACROMIAL DECOMPRESSION, release long head biceps tendon.;  Surgeon: Leanor Kail, MD;  Location: [facility] ORS;  Service: Orthopedics;  Laterality: Right;    Family History  Problem Relation Age of Onset  . Alcoholism Other        brother  . Arthritis  Other        parent  . Breast cancer Other        mother and grandmother  . Hyperlipidemia Father   . Heart disease Father   . Stroke Father   . Hypertension Father   . Diabetes Other        parent and other family member  . Breast cancer Mother 64  . Breast cancer Maternal Grandmother        young       Current Outpatient Medications:  .  fluticasone (FLONASE) 50 MCG/ACT nasal spray,  Place 2 sprays into both nostrils daily., Disp: 16 mL, Rfl: 5 .  hydrochlorothiazide (HYDRODIURIL) 25 MG tablet, Take 1 tablet (25 mg total) by mouth daily., Disp: 90 tablet, Rfl: 3 .  meloxicam (MOBIC) 15 MG tablet, Take 15 mg by mouth daily., Disp: , Rfl:  .  mirabegron ER (MYRBETRIQ) 50 MG TB24 tablet, Take 1 tablet (50 mg total) by mouth daily., Disp: 30 tablet, Rfl: 11 .  nitrofurantoin, macrocrystal-monohydrate, (MACROBID) 100 MG capsule, Take 1 capsule (100 mg total) by mouth every 12 (twelve) hours., Disp: 14 capsule, Rfl: 0 .  RABEprazole (ACIPHEX) 20 MG tablet, Take 20 mg by mouth daily., Disp: , Rfl:  .  traMADol (ULTRAM) 50 MG tablet, TAKE 1 TABLET (50 MG TOTAL) BY MOUTH EVERY 6 (SIX) HOURS AS NEEDED FOR PAIN FOR UP TO 30 DOSES, Disp: , Rfl:  .  buPROPion (WELLBUTRIN XL) 300 MG 24 hr tablet, Take 1 tablet (300 mg total) by mouth every morning., Disp: 90 tablet, Rfl: 2 .  cyanocobalamin (,VITAMIN B-12,) 1000 MCG/ML injection, 1000 mcg (1 mg) injection once per week for four weeks, followed by 1000 mcg injection once per month. (Patient not taking: Reported on 02/18/2020), Disp: 1 mL, Rfl: 15 .  losartan (COZAAR) 50 MG tablet, Take 1 tablet (50 mg total) by mouth daily., Disp: 90 tablet, Rfl: 1  EXAM:  VITALS per patient if applicable: Wt Readings from Last 3 Encounters:  04/03/20 294 lb (133.4 kg)  02/18/20 289 lb (131.1 kg)  12/08/19 283 lb (128.4 kg)   BP Readings from Last 3 Encounters:  03/27/20 (!) 160/82  11/19/19 140/88  10/16/19 121/68    GENERAL: alert, oriented, appears well and in no acute distress  HEENT: atraumatic, conjunttiva clear, no obvious abnormalities on inspection of external nose and ears  NECK: normal movements of the head and neck  LUNGS: on inspection no signs of respiratory distress, breathing rate appears normal, no obvious gross SOB, gasping or wheezing  CV: no obvious cyanosis  MS: moves all visible extremities without noticeable  abnormality  PSYCH/NEURO: pleasant and cooperative, no obvious depression or anxiety, speech and thought processing grossly intact  ASSESSMENT AND PLAN:  Discussed the following assessment and plan:  Essential hypertension - Plan: losartan (COZAAR) 50 MG tablet  DDD (degenerative disc disease), lumbar - Plan: DG Lumbar Spine Complete, Ambulatory referral to Pain Clinic  Depression, major, recurrent, moderate ([place]) - Plan: buPROPion (WELLBUTRIN XL) 300 MG 24 hr tablet  Ankle swelling, right - Plan: US Venous Img Lower Unilateral Right  OSA (obstructive sleep apnea)  B12 deficiency Problem List Items Addressed This Visit      Cardiovascular and Mediastinum   Essential hypertension - Primary    Remain concern elevated however upon review of blood pressures, she had values of good control in 10/2019. Advised to try to limit mobic as suspect contributory.  Again asked patient to come in office  and will also keep log at home.       Relevant Medications   losartan (COZAAR) 50 MG tablet     Respiratory   RESOLVED: OSA (obstructive sleep apnea)    No OSA per last pulmonology note.         Musculoskeletal and Integument   DDD (degenerative disc disease), lumbar    Acute on chronic.pending xr , pain management referral.       Relevant Orders   DG Lumbar Spine Complete   Ambulatory referral to Pain Clinic     Other   Ankle swelling, right    Ordered stat US as limited by assessment due to virtual appointment. Patient offered to go today for [place]; she declines and would like to go next week. She understands any worsening of symptoms or development of heat, redness, calf pain, would warrant emergency room evaluation      Relevant Orders   US Venous Img Lower Unilateral Right   B12 deficiency    Restarting b12 injections since interrupted      Depression, major, recurrent, moderate ([facility])    Improved on wellbutrin, will continue.      Relevant Medications   buPROPion  (WELLBUTRIN XL) 300 MG 24 hr tablet      -we discussed possible serious and likely etiologies, options for evaluation and workup, limitations of telemedicine visit vs in person visit, treatment, treatment risks and precautions. Pt prefers to treat via telemedicine empirically rather then risking or undertaking an in person visit at this moment. Patient agrees to seek prompt in person care if worsening, new symptoms arise, or if is not improving with treatment.   I discussed the assessment and treatment plan with the patient. The patient was provided an opportunity to ask questions and all were answered. The patient agreed with the plan and demonstrated an understanding of the instructions.   The patient was advised to call back or seek an in-person evaluation if the symptoms worsen or if the condition fails to improve as anticipated.   Mable Paris, FNP

## 2020-04-05 DIAGNOSIS — J209 Acute bronchitis, unspecified: Secondary | ICD-10-CM | POA: Diagnosis not present

## 2020-04-05 DIAGNOSIS — B9689 Other specified bacterial agents as the cause of diseases classified elsewhere: Secondary | ICD-10-CM | POA: Diagnosis not present

## 2020-04-05 DIAGNOSIS — J019 Acute sinusitis, unspecified: Secondary | ICD-10-CM | POA: Diagnosis not present

## 2020-04-05 DIAGNOSIS — U071 COVID-19: Secondary | ICD-10-CM | POA: Diagnosis not present

## 2020-04-05 DIAGNOSIS — Z03818 Encounter for observation for suspected exposure to other biological agents ruled out: Secondary | ICD-10-CM | POA: Diagnosis not present

## 2020-04-06 ENCOUNTER — Telehealth: Payer: Self-pay | Admitting: Family

## 2020-04-06 NOTE — Telephone Encounter (Signed)
Pt called in and stated that she has covid and has a bad cough and need a cough medication

## 2020-04-06 NOTE — Telephone Encounter (Signed)
Called patient to schedule video visit and discuss medication. Patient states that she needs something immediately for the cough. I recommended going to the urgent care or ED and patient declined and states she will not break quarantine. Recommended patient access her mychart and request an Urgent Care Video Visit to try and be seen more immediately. Jacqueline Delgado states she will attempt to access mychart for the video visit and scheduled for a video visit with Denice Paradise on Monday 04/10/20.

## 2020-04-06 NOTE — Telephone Encounter (Signed)
Would recommend virtual appt or urgent care appt for evaluation to see what is needed and may be a candidate for antibody infusion.

## 2020-04-07 DIAGNOSIS — R05 Cough: Secondary | ICD-10-CM | POA: Diagnosis not present

## 2020-04-09 DIAGNOSIS — R0600 Dyspnea, unspecified: Secondary | ICD-10-CM | POA: Diagnosis not present

## 2020-04-09 DIAGNOSIS — R0602 Shortness of breath: Secondary | ICD-10-CM | POA: Diagnosis not present

## 2020-04-09 DIAGNOSIS — D6859 Other primary thrombophilia: Secondary | ICD-10-CM | POA: Diagnosis not present

## 2020-04-09 DIAGNOSIS — I10 Essential (primary) hypertension: Secondary | ICD-10-CM | POA: Diagnosis not present

## 2020-04-09 DIAGNOSIS — M7989 Other specified soft tissue disorders: Secondary | ICD-10-CM | POA: Diagnosis not present

## 2020-04-09 DIAGNOSIS — R509 Fever, unspecified: Secondary | ICD-10-CM | POA: Diagnosis not present

## 2020-04-09 DIAGNOSIS — U071 COVID-19: Secondary | ICD-10-CM | POA: Diagnosis not present

## 2020-04-09 DIAGNOSIS — R600 Localized edema: Secondary | ICD-10-CM | POA: Diagnosis not present

## 2020-04-09 DIAGNOSIS — R69 Illness, unspecified: Secondary | ICD-10-CM | POA: Diagnosis not present

## 2020-04-09 DIAGNOSIS — Z6841 Body Mass Index (BMI) 40.0 and over, adult: Secondary | ICD-10-CM | POA: Diagnosis not present

## 2020-04-09 DIAGNOSIS — M5136 Other intervertebral disc degeneration, lumbar region: Secondary | ICD-10-CM | POA: Diagnosis not present

## 2020-04-09 DIAGNOSIS — J1282 Pneumonia due to coronavirus disease 2019: Secondary | ICD-10-CM | POA: Diagnosis not present

## 2020-04-09 DIAGNOSIS — E559 Vitamin D deficiency, unspecified: Secondary | ICD-10-CM | POA: Diagnosis not present

## 2020-04-09 DIAGNOSIS — J9601 Acute respiratory failure with hypoxia: Secondary | ICD-10-CM | POA: Diagnosis not present

## 2020-04-10 ENCOUNTER — Telehealth: Payer: Medicare HMO | Admitting: Nurse Practitioner

## 2020-04-10 ENCOUNTER — Other Ambulatory Visit: Payer: Self-pay

## 2020-04-11 ENCOUNTER — Telehealth: Payer: Self-pay | Admitting: Family

## 2020-04-11 ENCOUNTER — Ambulatory Visit: Payer: Medicare HMO

## 2020-04-11 ENCOUNTER — Other Ambulatory Visit: Payer: Medicare HMO

## 2020-04-11 ENCOUNTER — Ambulatory Visit: Admission: RE | Admit: 2020-04-11 | Payer: Medicare HMO | Source: Ambulatory Visit

## 2020-04-11 NOTE — Telephone Encounter (Signed)
This sounds like a HFU patient? I am on vacation later this week so not sure but does she need sooner HFU appt with one my colleague?  Please look into and find a suitable appt with me or colleague

## 2020-04-11 NOTE — Telephone Encounter (Signed)
Jacqueline Delgado with Ultrasound called to report that pt No showed her appt  Today

## 2020-04-11 NOTE — Telephone Encounter (Signed)
[facility] called in and stated that pt will be discharged in a few day and that she will be in there care in [facility] up to 30 days you can contact Shelly if you need more information on pt at [phone]

## 2020-04-12 NOTE — Telephone Encounter (Signed)
Azzel,  See below message to denisa We rec'ed [facility] STD paperwork  Speak to patient to complete form- when does the want to return to work?

## 2020-04-12 NOTE — Telephone Encounter (Signed)
noted 

## 2020-04-12 NOTE — Telephone Encounter (Signed)
Attempted to call patient and could not leave a voicemail to call back.

## 2020-04-12 NOTE — Telephone Encounter (Signed)
Jacqueline Delgado states that she wants to return to work on July 30th. She does not know when she will be discharged from the hospital but expects a discharge on 04/13/2020. Hospital follow up scheduled for 04/24/20 by MyChart virtual visit

## 2020-04-13 NOTE — Telephone Encounter (Signed)
Noted. Will follow with transition of care on 7/2 as appropriate.

## 2020-04-14 ENCOUNTER — Telehealth: Payer: Self-pay

## 2020-04-14 NOTE — Telephone Encounter (Signed)
Patient is discharged today 04/14/20.  Will follow as appropriate with transition of care on 04/18/20.  Scheduled hospital follow up with PMD 04/24/20 @ 11:30. Keep all appointments.

## 2020-04-15 DIAGNOSIS — M791 Myalgia, unspecified site: Secondary | ICD-10-CM | POA: Diagnosis not present

## 2020-04-15 DIAGNOSIS — D6869 Other thrombophilia: Secondary | ICD-10-CM | POA: Diagnosis not present

## 2020-04-15 DIAGNOSIS — R519 Headache, unspecified: Secondary | ICD-10-CM | POA: Diagnosis not present

## 2020-04-15 DIAGNOSIS — I10 Essential (primary) hypertension: Secondary | ICD-10-CM | POA: Diagnosis not present

## 2020-04-15 DIAGNOSIS — R630 Anorexia: Secondary | ICD-10-CM | POA: Diagnosis not present

## 2020-04-15 DIAGNOSIS — J9601 Acute respiratory failure with hypoxia: Secondary | ICD-10-CM | POA: Diagnosis not present

## 2020-04-15 DIAGNOSIS — U071 COVID-19: Secondary | ICD-10-CM | POA: Diagnosis not present

## 2020-04-15 DIAGNOSIS — J1282 Pneumonia due to coronavirus disease 2019: Secondary | ICD-10-CM | POA: Diagnosis not present

## 2020-04-15 DIAGNOSIS — R439 Unspecified disturbances of smell and taste: Secondary | ICD-10-CM | POA: Diagnosis not present

## 2020-04-16 ENCOUNTER — Other Ambulatory Visit: Payer: Self-pay

## 2020-04-16 DIAGNOSIS — U071 COVID-19: Secondary | ICD-10-CM | POA: Diagnosis not present

## 2020-04-16 DIAGNOSIS — I10 Essential (primary) hypertension: Secondary | ICD-10-CM | POA: Diagnosis not present

## 2020-04-16 DIAGNOSIS — R110 Nausea: Secondary | ICD-10-CM | POA: Insufficient documentation

## 2020-04-16 DIAGNOSIS — Z8616 Personal history of COVID-19: Secondary | ICD-10-CM | POA: Diagnosis not present

## 2020-04-16 DIAGNOSIS — Z96652 Presence of left artificial knee joint: Secondary | ICD-10-CM | POA: Insufficient documentation

## 2020-04-16 DIAGNOSIS — R439 Unspecified disturbances of smell and taste: Secondary | ICD-10-CM | POA: Diagnosis not present

## 2020-04-16 DIAGNOSIS — I517 Cardiomegaly: Secondary | ICD-10-CM | POA: Diagnosis not present

## 2020-04-16 DIAGNOSIS — R109 Unspecified abdominal pain: Secondary | ICD-10-CM | POA: Diagnosis not present

## 2020-04-16 DIAGNOSIS — R112 Nausea with vomiting, unspecified: Secondary | ICD-10-CM | POA: Diagnosis not present

## 2020-04-16 DIAGNOSIS — J9601 Acute respiratory failure with hypoxia: Secondary | ICD-10-CM | POA: Diagnosis not present

## 2020-04-16 DIAGNOSIS — R630 Anorexia: Secondary | ICD-10-CM | POA: Diagnosis not present

## 2020-04-16 DIAGNOSIS — Z79899 Other long term (current) drug therapy: Secondary | ICD-10-CM | POA: Insufficient documentation

## 2020-04-16 DIAGNOSIS — R918 Other nonspecific abnormal finding of lung field: Secondary | ICD-10-CM | POA: Diagnosis not present

## 2020-04-16 DIAGNOSIS — R519 Headache, unspecified: Secondary | ICD-10-CM | POA: Diagnosis not present

## 2020-04-16 DIAGNOSIS — R5381 Other malaise: Secondary | ICD-10-CM | POA: Diagnosis not present

## 2020-04-16 DIAGNOSIS — J189 Pneumonia, unspecified organism: Secondary | ICD-10-CM | POA: Diagnosis not present

## 2020-04-16 DIAGNOSIS — M791 Myalgia, unspecified site: Secondary | ICD-10-CM | POA: Diagnosis not present

## 2020-04-16 DIAGNOSIS — J1282 Pneumonia due to coronavirus disease 2019: Secondary | ICD-10-CM | POA: Diagnosis not present

## 2020-04-16 DIAGNOSIS — D6869 Other thrombophilia: Secondary | ICD-10-CM | POA: Diagnosis not present

## 2020-04-16 NOTE — ED Triage Notes (Signed)
Patient reports nausea and constipation (last pm a week ago).

## 2020-04-17 ENCOUNTER — Emergency Department
Admission: EM | Admit: 2020-04-17 | Discharge: 2020-04-17 | Disposition: A | Discharge status: Home / Self Care | Payer: Medicare HMO | Attending: Student in an Organized Health Care Education/Training Program | Admitting: Student in an Organized Health Care Education/Training Program

## 2020-04-17 ENCOUNTER — Emergency Department: Payer: Medicare HMO

## 2020-04-17 DIAGNOSIS — R109 Unspecified abdominal pain: Secondary | ICD-10-CM | POA: Diagnosis not present

## 2020-04-17 DIAGNOSIS — R918 Other nonspecific abnormal finding of lung field: Secondary | ICD-10-CM | POA: Diagnosis not present

## 2020-04-17 DIAGNOSIS — R110 Nausea: Secondary | ICD-10-CM

## 2020-04-17 DIAGNOSIS — J189 Pneumonia, unspecified organism: Secondary | ICD-10-CM | POA: Diagnosis not present

## 2020-04-17 DIAGNOSIS — I517 Cardiomegaly: Secondary | ICD-10-CM | POA: Diagnosis not present

## 2020-04-17 LAB — COMPREHENSIVE METABOLIC PANEL
ALT: 40 U/L (ref 0–44)
AST: 33 U/L (ref 15–41)
Albumin: 3.1 g/dL — ABNORMAL LOW (ref 3.5–5.0)
Alkaline Phosphatase: 90 U/L (ref 38–126)
Anion gap: 11 (ref 5–15)
BUN: 16 mg/dL (ref 8–23)
CO2: 29 mmol/L (ref 22–32)
Calcium: 8.6 mg/dL — ABNORMAL LOW (ref 8.9–10.3)
Chloride: 98 mmol/L (ref 98–111)
Creatinine, Ser: 0.91 mg/dL (ref 0.44–1.00)
GFR calc Af Amer: >60 mL/min (ref >60)
GFR calc non Af Amer: >60 mL/min (ref >60)
Glucose, Bld: 107 mg/dL — ABNORMAL HIGH (ref 70–99)
Potassium: 3.1 mmol/L — ABNORMAL LOW (ref 3.5–5.1)
Sodium: 138 mmol/L (ref 135–145)
Total Bilirubin: 0.9 mg/dL (ref 0.3–1.2)
Total Protein: 6.9 g/dL (ref 6.5–8.1)

## 2020-04-17 LAB — CBC WITH DIFFERENTIAL/PLATELET
Abs Immature Granulocytes: 0.09 10*3/uL — ABNORMAL HIGH (ref 0.00–0.07)
Basophils Absolute: 0 10*3/uL (ref 0.0–0.1)
Basophils Relative: 0 %
Eosinophils Absolute: 0.1 10*3/uL (ref 0.0–0.5)
Eosinophils Relative: 1 %
HCT: 36.9 % (ref 36.0–46.0)
Hemoglobin: 11.4 g/dL — ABNORMAL LOW (ref 12.0–15.0)
Immature Granulocytes: 1 %
Lymphocytes Relative: 13 %
Lymphs Abs: 1 10*3/uL (ref 0.7–4.0)
MCH: 25.3 pg — ABNORMAL LOW (ref 26.0–34.0)
MCHC: 30.9 g/dL (ref 30.0–36.0)
MCV: 82 fL (ref 80.0–100.0)
Monocytes Absolute: 0.7 10*3/uL (ref 0.1–1.0)
Monocytes Relative: 9 %
Neutro Abs: 5.9 10*3/uL (ref 1.7–7.7)
Neutrophils Relative %: 76 %
Platelets: 215 10*3/uL (ref 150–400)
RBC: 4.5 MIL/uL (ref 3.87–5.11)
RDW: 15.1 % (ref 11.5–15.5)
WBC: 7.8 10*3/uL (ref 4.0–10.5)
nRBC: 0 % (ref 0.0–0.2)

## 2020-04-17 MED ORDER — POLYETHYLENE GLYCOL 3350 17 G PO PACK
17.0000 g | PACK | Freq: Every day | ORAL | 0 refills | Status: DC
Start: 2020-04-17 — End: 2020-04-24

## 2020-04-17 MED ORDER — SODIUM CHLORIDE 0.9 % IV BOLUS
500.0000 mL | Freq: Once | INTRAVENOUS | Status: AC
  Administered 2020-04-17: 500 mL via INTRAVENOUS

## 2020-04-17 MED ORDER — ONDANSETRON HCL 4 MG PO TABS: 4.0000 mg | ORAL_TABLET | Freq: Every day | ORAL | 0 refills | Status: DC | PRN

## 2020-04-17 MED ORDER — ONDANSETRON HCL 4 MG/2ML IJ SOLN
4.0000 mg | Freq: Once | INTRAMUSCULAR | Status: AC
  Administered 2020-04-17: 4 mg via INTRAVENOUS
  Filled 2020-04-17: qty 2

## 2020-04-17 NOTE — ED Provider Notes (Signed)
[facility] Emergency Department Provider Note    First MD Initiated Contact with Patient 04/17/20 [phone]     (approximate)  I have reviewed the triage vital signs and the nursing notes.   HISTORY  Chief Complaint Nausea and Constipation    HPI Jacqueline Delgado is a 69 y.o. female with the below listed past medical history recently admitted to hospital for Covid presents to the ER for generalized nausea not moving her bowels and generalized malaise.  Still having persistent cough but denies any worsening shortness of breath or chest pain.  Still persisted nausea decreased p.o. intake.    Past Medical History:  Diagnosis Date  . Anginal pain ([place])     3/8-12/21/10  . Arthritis    Osteoarthritis in BLE knee  . Colon polyp   . GERD (gastroesophageal reflux disease)   . Hiatal hernia   . History of hiatal hernia   . Hypertension    controlled  . Obesity   . Reflux   . Sleep apnea    NO CPAP  . Small vessel disease ([place])    Family History  Problem Relation Age of Onset  . Alcoholism Other        brother  . Arthritis Other        parent  . Breast cancer Other        mother and grandmother  . Hyperlipidemia Father   . Heart disease Father   . Stroke Father   . Hypertension Father   . Diabetes Other        parent and other family member  . Breast cancer Mother 50  . Breast cancer Maternal Grandmother        young   Past Surgical History:  Procedure Laterality Date  . ABDOMINAL HYSTERECTOMY     partial; per patient has left ovary  . CHOLECYSTECTOMY    . COLONOSCOPY W/ POLYPECTOMY     adenomatous colon polyp  . COLONOSCOPY WITH PROPOFOL N/A 12/10/2017   Procedure: COLONOSCOPY WITH PROPOFOL;  Surgeon: Manya Silvas, MD;  Location: [facility] ENDOSCOPY;  Service: Endoscopy;  Laterality: N/A;  . ESOPHAGOGASTRODUODENOSCOPY (EGD) WITH PROPOFOL N/A 10/11/2015   Procedure: ESOPHAGOGASTRODUODENOSCOPY (EGD) WITH PROPOFOL;  Surgeon: Manya Silvas, MD;   Location: [facility] ENDOSCOPY;  Service: Endoscopy;  Laterality: N/A;  . ESOPHAGOGASTRODUODENOSCOPY (EGD) WITH PROPOFOL N/A 12/10/2017   Procedure: ESOPHAGOGASTRODUODENOSCOPY (EGD) WITH PROPOFOL;  Surgeon: Manya Silvas, MD;  Location: [facility] ENDOSCOPY;  Service: Endoscopy;  Laterality: N/A;  . JOINT REPLACEMENT Right    knee, Oct 2012  . JOINT REPLACEMENT     hopefully getting left partial knee replacement in July 2016  . SAVORY DILATION N/A 10/11/2015   Procedure: SAVORY DILATION;  Surgeon: Manya Silvas, MD;  Location: [facility] ENDOSCOPY;  Service: Endoscopy;  Laterality: N/A;  . SHOULDER ARTHROSCOPY WITH ROTATOR CUFF REPAIR AND SUBACROMIAL DECOMPRESSION Right 06/14/2015   Procedure: SHOULDER ARTHROSCOPY WITH mini open ROTATOR CUFF REPAIR AND SUBACROMIAL DECOMPRESSION, release long head biceps tendon.;  Surgeon: Leanor Kail, MD;  Location: [facility] ORS;  Service: Orthopedics;  Laterality: Right;   Patient Active Problem List   Diagnosis Date Noted  . Ankle swelling, right 04/03/2020  . Hiatal hernia 02/18/2020  . Small vessel vasculitis ([place]) 02/18/2020  . Non-cardiac chest pain 02/18/2020  . Vaginal discharge 10/01/2019  . SOB (shortness of breath) on exertion 09/23/2019  . Anemia 09/23/2019  . B12 deficiency 09/23/2019  . Vitamin D deficiency 09/23/2019  . Dark urine 05/14/2019  .  Hot flashes 05/14/2019  . Screening for breast cancer 05/14/2019  . Skin fissures 04/22/2019  . Obesity (BMI 35.0-39.9 without comorbidity) 12/09/2018  . Fatigue 12/09/2018  . Localized swelling of finger of left hand 11/18/2018  . Cough 11/11/2018  . Genital herpes simplex 11/11/2018  . Essential hypertension 11/11/2018  . GERD (gastroesophageal reflux disease) 11/11/2018  . Morbid obesity with BMI of 45.0-49.9, adult ([place]) 09/07/2018  . Plantar fasciitis 05/03/2016  . Acquired trigger finger 11/23/2015  . Depression, major, recurrent, moderate ([place]) 07/06/2015  . Closed nondisplaced fracture of base  of fifth metacarpal bone of right hand 06/22/2015  . S/P rotator cuff repair 06/22/2015  . Complete tear of right rotator cuff 05/23/2015  . Primary osteoarthritis of left knee 02/22/2015  . DDD (degenerative disc disease), cervical 08/03/2014  . DDD (degenerative disc disease), lumbar 08/03/2014  . Leukocytoclastic vasculitis ([place]) 08/03/2014  . Neck pain 08/03/2014  . Obesity, Class III, BMI 40-49.9 (morbid obesity) ([place]) 08/03/2014  . Cerebral vascular malformation 08/02/2014  . Unspecified osteoarthritis, unspecified site 07/05/2014  . Low back pain 09/20/2013  . Osteoarthritis, knee 09/20/2013  . Essential (primary) hypertension 09/20/2013  . Adenomatous colon polyp 03/21/2006      Prior to Admission medications   Medication Sig Start Date End Date Taking? Authorizing Provider  buPROPion (WELLBUTRIN XL) 300 MG 24 hr tablet Take 1 tablet (300 mg total) by mouth every morning. 04/03/20   Burnard Hawthorne, FNP  cyanocobalamin (,VITAMIN B-12,) 1000 MCG/ML injection 1000 mcg (1 mg) injection once per week for four weeks, followed by 1000 mcg injection once per month. Patient not taking: Reported on 02/18/2020 09/24/19   Burnard Hawthorne, FNP  fluticasone [facility]) 50 MCG/ACT nasal spray Place 2 sprays into both nostrils daily. 12/28/19   Burnard Hawthorne, FNP  hydrochlorothiazide (HYDRODIURIL) 25 MG tablet Take 1 tablet (25 mg total) by mouth daily. 11/19/19 04/03/20  Kate Sable, MD  losartan (COZAAR) 50 MG tablet Take 1 tablet (50 mg total) by mouth daily. 04/03/20   Burnard Hawthorne, FNP  meloxicam (MOBIC) 15 MG tablet Take 15 mg by mouth daily. 08/07/19   [provider]  mirabegron ER (MYRBETRIQ) 50 MG TB24 tablet Take 1 tablet (50 mg total) by mouth daily. 03/27/20   Bjorn Loser, MD  nitrofurantoin, macrocrystal-monohydrate, (MACROBID) 100 MG capsule Take 1 capsule (100 mg total) by mouth every 12 (twelve) hours. 04/03/20   MacDiarmid, Nicki Reaper, MD  ondansetron  (ZOFRAN) 4 MG tablet Take 1 tablet (4 mg total) by mouth daily as needed. 04/17/20 04/17/21  Merlyn Lot, MD  polyethylene glycol (MIRALAX / GLYCOLAX) 17 g packet Take 17 g by mouth daily. Mix one tablespoon with 8oz of your favorite juice or water every day until you are having soft formed stools. Then start taking once daily if you didn't have a stool the day before. 04/17/20   Merlyn Lot, MD  RABEprazole (ACIPHEX) 20 MG tablet Take 20 mg by mouth daily.    [provider]  traMADol (ULTRAM) 50 MG tablet TAKE 1 TABLET (50 MG TOTAL) BY MOUTH EVERY 6 (SIX) HOURS AS NEEDED FOR PAIN FOR UP TO 30 DOSES 03/29/19   [provider]    Allergies Patient has no known allergies.    Social History Social History   Tobacco Use  . Smoking status: Never Smoker  . Smokeless tobacco: Never Used  Vaping Use  . Vaping Use: Never used  Substance Use Topics  . Alcohol use: No  .  Drug use: No    Review of Systems Patient denies headaches, rhinorrhea, blurry vision, numbness, shortness of breath, chest pain, edema, cough, abdominal pain, nausea, vomiting, diarrhea, dysuria, fevers, rashes or hallucinations unless otherwise stated above in HPI. ____________________________________________   PHYSICAL EXAM:  VITAL SIGNS: Vitals:   04/17/20 0633 04/17/20 0633  BP: (!) 142/65   Pulse: 83   Resp: 18   Temp:  98.3 F (36.8 C)  SpO2: 97%     Constitutional: Alert and oriented.  Eyes: Conjunctivae are normal.  Head: Atraumatic. Nose: No congestion/rhinnorhea. Mouth/Throat: Mucous membranes are moist.   Neck: No stridor. Painless ROM.  Cardiovascular: Normal rate, regular rhythm. Grossly normal heart sounds.  Good peripheral circulation. Respiratory: Normal respiratory effort.  No retractions. Lungs with bibasilar crackles Gastrointestinal: Soft and nontender. No distention. No abdominal bruits. No CVA tenderness. Genitourinary:  Musculoskeletal: No lower extremity  tenderness nor edema.  No joint effusions. Neurologic:  Normal speech and language. No gross focal neurologic deficits are appreciated. No facial droop Skin:  Skin is warm, dry and intact. No rash noted. Psychiatric: Mood and affect are normal. Speech and behavior are normal.  ____________________________________________   LABS (all labs ordered are listed, but only abnormal results are displayed)  Results for orders placed or performed during the hospital encounter of 04/17/20 (from the past 24 hour(s))  CBC with Differential/Platelet     Status: Abnormal   Collection Time: 04/17/20  3:59 AM  Result Value Ref Range   WBC 7.8 4.0 - 10.5 K/uL   RBC 4.50 3.87 - 5.11 MIL/uL   Hemoglobin 11.4 (L) 12.0 - 15.0 g/dL   HCT 36.9 36 - 46 %   MCV 82.0 80.0 - 100.0 fL   MCH 25.3 (L) 26.0 - 34.0 pg   MCHC 30.9 30.0 - 36.0 g/dL   RDW 15.1 11.5 - 15.5 %   Platelets 215 150 - 400 K/uL   nRBC 0.0 0.0 - 0.2 %   Neutrophils Relative % 76 %   Neutro Abs 5.9 1.7 - 7.7 K/uL   Lymphocytes Relative 13 %   Lymphs Abs 1.0 0.7 - 4.0 K/uL   Monocytes Relative 9 %   Monocytes Absolute 0.7 0 - 1 K/uL   Eosinophils Relative 1 %   Eosinophils Absolute 0.1 0 - 0 K/uL   Basophils Relative 0 %   Basophils Absolute 0.0 0 - 0 K/uL   Immature Granulocytes 1 %   Abs Immature Granulocytes 0.09 (H) 0.00 - 0.07 K/uL  Comprehensive metabolic panel     Status: Abnormal   Collection Time: 04/17/20  3:59 AM  Result Value Ref Range   Sodium 138 135 - 145 mmol/L   Potassium 3.1 (L) 3.5 - 5.1 mmol/L   Chloride 98 98 - 111 mmol/L   CO2 29 22 - 32 mmol/L   Glucose, Bld 107 (H) 70 - 99 mg/dL   BUN 16 8 - 23 mg/dL   Creatinine, Ser 0.91 0.44 - 1.00 mg/dL   Calcium 8.6 (L) 8.9 - 10.3 mg/dL   Total Protein 6.9 6.5 - 8.1 g/dL   Albumin 3.1 (L) 3.5 - 5.0 g/dL   AST 33 15 - 41 U/L   ALT 40 0 - 44 U/L   Alkaline Phosphatase 90 38 - 126 U/L   Total Bilirubin 0.9 0.3 - 1.2 mg/dL   GFR calc non Af Amer >60 >60 mL/min   GFR  calc Af Amer >60 >60 mL/min   Anion gap 11 5 -  15   ____________________________________________ ____________________________________________  RADIOLOGY  I personally reviewed all radiographic images ordered to evaluate for the above acute complaints and reviewed radiology reports and findings.  These findings were personally discussed with the patient.  Please see medical record for radiology report.  ____________________________________________   PROCEDURES  Procedure(s) performed:  Procedures    Critical Care performed: no ____________________________________________   INITIAL IMPRESSION / ASSESSMENT AND PLAN / ED COURSE  Pertinent labs & imaging results that were available during my care of the patient were reviewed by me and considered in my medical decision making (see chart for details).   DDX: Enteritis, gastritis, dehydration, SBO, constipation, post Covid syndrome  Jacqueline Delgado is a 69 y.o. who presents to the ED with symptoms as described above.  Patient nontoxic-appearing.  No respiratory distress.  She is satting well on her home oxygen.  Her abdominal exam is soft and benign.  Her blood work is fairly reassuring.  She is concerned over not moving her bowels very much but states that she also has not been eating or drinking much.  Will order radiographs to evaluate for any evidence of abnormal bowel gas pattern.  Have a lower suspicion for acute intra-abdominal process and suspect this is more secondary to persistent nausea will give Zofran and reassess.  Clinical Course as of Apr 17 648  Mon Apr 17, 2020  0647 Patient reassessed.  Repeat abdominal exam is soft and benign.  No worsening shortness of breath.  On further questioning the patient sounds like the majority of her symptoms are related to nausea in the post Covid state setting and decreased p.o. intake secondary to loss of taste.  She is not complaining of any abdominal pain.  Her abdominal exam is soft and  benign.  I do not feel that CT imaging clinically indicated with this presentation.  Her symptoms were improved with Zofran and some fluids.  She is now tolerating p.o.  We discussed signs and symptoms for which she should return to the ER.   [PR]    Clinical Course User Index [PR] Merlyn Lot, MD    The patient was evaluated in Emergency Department today for the symptoms described in the history of present illness. He/she was evaluated in the context of the global COVID-19 pandemic, which necessitated consideration that the patient might be at risk for infection with the SARS-CoV-2 virus that causes COVID-19. Institutional protocols and algorithms that pertain to the evaluation of patients at risk for COVID-19 are in a state of rapid change based on information released by regulatory bodies including the CDC and federal and state organizations. These policies and algorithms were followed during the patient's care in the ED.  As part of my medical decision making, I reviewed the following data within the [employer] notes reviewed and incorporated, Labs reviewed, notes from prior ED visits and [place] Controlled Substance Database   ____________________________________________   FINAL CLINICAL IMPRESSION(S) / ED DIAGNOSES  Final diagnoses:  Nausea      NEW MEDICATIONS STARTED DURING THIS VISIT:  New Prescriptions   ONDANSETRON (ZOFRAN) 4 MG TABLET    Take 1 tablet (4 mg total) by mouth daily as needed.   POLYETHYLENE GLYCOL (MIRALAX / GLYCOLAX) 17 G PACKET    Take 17 g by mouth daily. Mix one tablespoon with 8oz of your favorite juice or water every day until you are having soft formed stools. Then start taking once daily if you didn't have a stool  the day before.     Note:  This document was prepared using Dragon voice recognition software and may include unintentional dictation errors.    Merlyn Lot, MD 04/17/20 [phone]

## 2020-04-17 NOTE — ED Notes (Addendum)
This RN updated pt's granddaughter on pt's status after receiving permission to do so from pt.  Pt visualized resting comfortably, speaking in full sentences, no c/o SHOB.

## 2020-04-17 NOTE — ED Notes (Signed)
Pt with xray 

## 2020-04-17 NOTE — ED Notes (Signed)
Pt updated on wait, pt verbalizes understanding.  °

## 2020-04-18 ENCOUNTER — Telehealth: Payer: Self-pay | Admitting: Family

## 2020-04-18 DIAGNOSIS — R439 Unspecified disturbances of smell and taste: Secondary | ICD-10-CM | POA: Diagnosis not present

## 2020-04-18 DIAGNOSIS — D6869 Other thrombophilia: Secondary | ICD-10-CM | POA: Diagnosis not present

## 2020-04-18 DIAGNOSIS — J1282 Pneumonia due to coronavirus disease 2019: Secondary | ICD-10-CM | POA: Diagnosis not present

## 2020-04-18 DIAGNOSIS — M791 Myalgia, unspecified site: Secondary | ICD-10-CM | POA: Diagnosis not present

## 2020-04-18 DIAGNOSIS — R630 Anorexia: Secondary | ICD-10-CM | POA: Diagnosis not present

## 2020-04-18 DIAGNOSIS — J9601 Acute respiratory failure with hypoxia: Secondary | ICD-10-CM | POA: Diagnosis not present

## 2020-04-18 DIAGNOSIS — R519 Headache, unspecified: Secondary | ICD-10-CM | POA: Diagnosis not present

## 2020-04-18 DIAGNOSIS — I10 Essential (primary) hypertension: Secondary | ICD-10-CM | POA: Diagnosis not present

## 2020-04-18 DIAGNOSIS — U071 COVID-19: Secondary | ICD-10-CM | POA: Diagnosis not present

## 2020-04-18 NOTE — Telephone Encounter (Signed)
Patient called office about her FMLA paper work.  [employer] put  04/24/2020 as a return to work. Patient is still on oxygen and stated there is no way she can return to work on that day.

## 2020-04-18 NOTE — Telephone Encounter (Signed)
Unable to reach patient for transition of care. Will follow as appropriate.

## 2020-04-19 NOTE — Telephone Encounter (Signed)
Called the patient and she states that she is on oxygen and gets out of breath walking around in house. Jacqueline Delgado asks if she can go back to work around the end of July and not on the 12th.

## 2020-04-19 NOTE — Telephone Encounter (Signed)
Transition Care Management Follow-up Telephone Call  Date of discharge and from where: 04/14/20 from [facility].  How have you been since you were released from the hospital? Patients states, "I'm okay, very tired whether I move or not, wearing oxygen 2L continuously, coughing and it is hard to get some sleep.  Some nausea when eating but it passes quickly." Denies swelling in extremities, pain, dizziness, headache, diarrhea, vomiting and all other symptoms. Compliant with 10 breathing treatments daily. Input/output appropriate. Home Health in process.  Any questions or concerns? Yes , unable to return to work on 04/24/20 (see chart review dialogue). Would like to discuss at appointment.   Items Reviewed:  Did the pt receive and understand the discharge instructions provided? Yes , increase activity as tolerated. 10 breathing treatments daily. Stay hydrated.   Medications obtained and verified? Patient declined going over medications in detail, notes she understands how to take everything and is not having any problem with them. Encouraged patient to reach back out to the office with any new concerns and discuss medications at scheduled appointment as needed. Patient agrees.   Any new allergies since your discharge? No   Dietary orders reviewed? Yes  Do you have support at home? Yes   Functional Questionnaire: (I = Independent and D = Dependent) ADLs: i  Bathing/Dressing- i  Meal Prep- granddaughter  Eating- i  Maintaining continence- i  Transferring/Ambulation- i  Managing Meds- i  Follow up appointments reviewed:   PCP Hospital f/u appt confirmed? Yes  Scheduled to see Mable Paris on 04/24/20 @ 11:30 via mychart visit.  Are transportation arrangements needed? No   If their condition worsens, is the pt aware to call PCP or go to the Emergency Dept.? Yes  Was the patient provided with contact information for the PCP's office or ED? Yes  Was to pt encouraged to call back with  questions or concerns? Yes

## 2020-04-19 NOTE — Telephone Encounter (Signed)
Will defer to PCP to make this determination once she returns to the office.

## 2020-04-20 DIAGNOSIS — R439 Unspecified disturbances of smell and taste: Secondary | ICD-10-CM | POA: Diagnosis not present

## 2020-04-20 DIAGNOSIS — J9601 Acute respiratory failure with hypoxia: Secondary | ICD-10-CM | POA: Diagnosis not present

## 2020-04-20 DIAGNOSIS — M791 Myalgia, unspecified site: Secondary | ICD-10-CM | POA: Diagnosis not present

## 2020-04-20 DIAGNOSIS — R630 Anorexia: Secondary | ICD-10-CM | POA: Diagnosis not present

## 2020-04-20 DIAGNOSIS — R69 Illness, unspecified: Secondary | ICD-10-CM | POA: Diagnosis not present

## 2020-04-20 DIAGNOSIS — D6869 Other thrombophilia: Secondary | ICD-10-CM | POA: Diagnosis not present

## 2020-04-20 DIAGNOSIS — J1282 Pneumonia due to coronavirus disease 2019: Secondary | ICD-10-CM | POA: Diagnosis not present

## 2020-04-20 DIAGNOSIS — U071 COVID-19: Secondary | ICD-10-CM | POA: Diagnosis not present

## 2020-04-20 DIAGNOSIS — I10 Essential (primary) hypertension: Secondary | ICD-10-CM | POA: Diagnosis not present

## 2020-04-20 DIAGNOSIS — R519 Headache, unspecified: Secondary | ICD-10-CM | POA: Diagnosis not present

## 2020-04-21 DIAGNOSIS — R630 Anorexia: Secondary | ICD-10-CM | POA: Diagnosis not present

## 2020-04-21 DIAGNOSIS — I10 Essential (primary) hypertension: Secondary | ICD-10-CM | POA: Diagnosis not present

## 2020-04-21 DIAGNOSIS — J1282 Pneumonia due to coronavirus disease 2019: Secondary | ICD-10-CM | POA: Diagnosis not present

## 2020-04-21 DIAGNOSIS — R519 Headache, unspecified: Secondary | ICD-10-CM | POA: Diagnosis not present

## 2020-04-21 DIAGNOSIS — J9601 Acute respiratory failure with hypoxia: Secondary | ICD-10-CM | POA: Diagnosis not present

## 2020-04-21 DIAGNOSIS — M791 Myalgia, unspecified site: Secondary | ICD-10-CM | POA: Diagnosis not present

## 2020-04-21 DIAGNOSIS — D6869 Other thrombophilia: Secondary | ICD-10-CM | POA: Diagnosis not present

## 2020-04-21 DIAGNOSIS — R439 Unspecified disturbances of smell and taste: Secondary | ICD-10-CM | POA: Diagnosis not present

## 2020-04-21 DIAGNOSIS — U071 COVID-19: Secondary | ICD-10-CM | POA: Diagnosis not present

## 2020-04-21 NOTE — Telephone Encounter (Signed)
Call pt I completely understand her needing more time Please fill out out paperwork she needs and you can extend to end of month.  She has an appt with [place] 7/12

## 2020-04-21 NOTE — Telephone Encounter (Signed)
noted 

## 2020-04-21 NOTE — Telephone Encounter (Signed)
Faxed FMLA paperwork to [phone]. fax has gone through

## 2020-04-21 NOTE — Telephone Encounter (Signed)
Spoke to patient about an extended FMLA date. Patient states that she has had no procedures done in the hospital and was off work since 04/06/20. [employer] wants to resume full time work on 05/01/20.

## 2020-04-22 DIAGNOSIS — U071 COVID-19: Secondary | ICD-10-CM | POA: Diagnosis not present

## 2020-04-22 DIAGNOSIS — J1282 Pneumonia due to coronavirus disease 2019: Secondary | ICD-10-CM | POA: Diagnosis not present

## 2020-04-24 ENCOUNTER — Encounter: Payer: Self-pay | Admitting: Family

## 2020-04-24 ENCOUNTER — Telehealth (INDEPENDENT_AMBULATORY_CARE_PROVIDER_SITE_OTHER): Payer: Medicare HMO | Admitting: Family

## 2020-04-24 ENCOUNTER — Telehealth: Payer: Self-pay | Admitting: Family

## 2020-04-24 DIAGNOSIS — U071 COVID-19: Secondary | ICD-10-CM | POA: Diagnosis not present

## 2020-04-24 DIAGNOSIS — J1282 Pneumonia due to coronavirus disease 2019: Secondary | ICD-10-CM

## 2020-04-24 NOTE — Progress Notes (Signed)
Virtual Visit via Video Note  I connected with@  on 04/24/20 at 11:30 AM EDT by a video enabled telemedicine application and verified that I am speaking with the correct person using two identifiers.  Location patient: home Location provider:work Persons participating in the virtual visit: patient, provider  I discussed the limitations of evaluation and management by telemedicine and the availability of in person appointments. The patient expressed understanding and agreed to proceed.   HPI: Continues to productive cough , improved. The cough is worst part and is aggravating.' Using hycodan cough syrup with relief in the evenings.  Using tessalon perles without much relief. 'no extreme shortness breath.' Not SOB when still, only with activity. No fever, CP.  Doesn't have incentive spirometer however has asked home health nurse. Had one in hospital.   Wearing 2l continuously, plans to wean down to 1L if able. Home health and PT coming out.  Monitors sa02 86-95% without o2.  Hospital follow-up , pneumonia due to COVID-19 admitted 6/27 and discharged 7 days later-4/4.  Discharged with home health, PT OT.  Had considered apixaban versus placebo study for 30 days post hospitalization, DVT prophylaxis. Ended up declining medication.  Covid PCR +6/23.  Treated with remdesivir, dexamethasone.  4 L of oxygen  During hospitalization.  Discharged home with oxygen at 2 L nasal cannula Venous Doppler -6/28, bilateral  Negative dvt Blood cultures no growth in 5 days.  AST, ALT elevated 04/14/20, improved from 04/13/20 Ddimer 409 WBC 7.6  ROS: See pertinent positives and negatives per HPI.  Past Medical History:  Diagnosis Date  . Anginal pain ([place])     3/8-12/21/10  . Arthritis    Osteoarthritis in BLE knee  . Colon polyp   . GERD (gastroesophageal reflux disease)   . Hiatal hernia   . History of hiatal hernia   . Hypertension    controlled  . Obesity   . Reflux   . Sleep apnea    NO  CPAP  . Small vessel disease [facility])     Past Surgical History:  Procedure Laterality Date  . ABDOMINAL HYSTERECTOMY     partial; per patient has left ovary  . CHOLECYSTECTOMY    . COLONOSCOPY W/ POLYPECTOMY     adenomatous colon polyp  . COLONOSCOPY WITH PROPOFOL N/A 12/10/2017   Procedure: COLONOSCOPY WITH PROPOFOL;  Surgeon: Manya Silvas, MD;  Location: [facility] ENDOSCOPY;  Service: Endoscopy;  Laterality: N/A;  . ESOPHAGOGASTRODUODENOSCOPY (EGD) WITH PROPOFOL N/A 10/11/2015   Procedure: ESOPHAGOGASTRODUODENOSCOPY (EGD) WITH PROPOFOL;  Surgeon: Manya Silvas, MD;  Location[facility] ENDOSCOPY;  Service: Endoscopy;  Laterality: N/A;  . ESOPHAGOGASTRODUODENOSCOPY (EGD) WITH PROPOFOL N/A 12/10/2017   Procedure: ESOPHAGOGASTRODUODENOSCOPY (EGD) WITH PROPOFOL;  Surgeon: Manya Silvas, MD;  Location: [facility] ENDOSCOPY;  Service: Endoscopy;  Laterality: N/A;  . JOINT REPLACEMENT Right    knee, Oct 2012  . JOINT REPLACEMENT     hopefully getting left partial knee replacement in July 2016  . SAVORY DILATION N/A 10/11/2015   Procedure: SAVORY DILATION;  Surgeon: Manya Silvas, MD;  Location: [facility] ENDOSCOPY;  Service: Endoscopy;  Laterality: N/A;  . SHOULDER ARTHROSCOPY WITH ROTATOR CUFF REPAIR AND SUBACROMIAL DECOMPRESSION Right 06/14/2015   Procedure: SHOULDER ARTHROSCOPY WITH mini open ROTATOR CUFF REPAIR AND SUBACROMIAL DECOMPRESSION, release long head biceps tendon.;  Surgeon: Leanor Kail, MD;  Location: [facility] ORS;  Service: Orthopedics;  Laterality: Right;    Family History  Problem Relation Age of Onset  . Alcoholism Other  brother  . Arthritis Other        parent  . Breast cancer Other        mother and grandmother  . Hyperlipidemia Father   . Heart disease Father   . Stroke Father   . Hypertension Father   . Diabetes Other        parent and other family member  . Breast cancer Mother 60  . Breast cancer Maternal Grandmother        young      Current Outpatient  Medications:  .  acetaminophen (TYLENOL) 325 MG tablet, Take by mouth., Disp: , Rfl:  .  albuterol (VENTOLIN HFA) 108 (90 Base) MCG/ACT inhaler, Inhale into the lungs., Disp: , Rfl:  .  benzonatate (TESSALON) 100 MG capsule, Take by mouth., Disp: , Rfl:  .  buPROPion (WELLBUTRIN XL) 300 MG 24 hr tablet, Take 1 tablet (300 mg total) by mouth every morning., Disp: 90 tablet, Rfl: 2 .  cyanocobalamin (,VITAMIN B-12,) 1000 MCG/ML injection, 1000 mcg (1 mg) injection once per week for four weeks, followed by 1000 mcg injection once per month., Disp: 1 mL, Rfl: 15 .  dextromethorphan-guaiFENesin (ROBITUSSIN-DM) 10-100 MG/5ML liquid, Take by mouth., Disp: , Rfl:  .  fluticasone (FLONASE) 50 MCG/ACT nasal spray, Place 2 sprays into both nostrils daily., Disp: 16 mL, Rfl: 5 .  HYDROcodone-homatropine (HYCODAN) 5-1.5 MG/5ML syrup, Take by mouth., Disp: , Rfl:  .  losartan (COZAAR) 50 MG tablet, Take 1 tablet (50 mg total) by mouth daily., Disp: 90 tablet, Rfl: 1 .  meloxicam (MOBIC) 15 MG tablet, Take 15 mg by mouth daily., Disp: , Rfl:  .  mirabegron ER (MYRBETRIQ) 50 MG TB24 tablet, Take 1 tablet (50 mg total) by mouth daily., Disp: 30 tablet, Rfl: 11 .  ondansetron (ZOFRAN) 4 MG tablet, Take 1 tablet (4 mg total) by mouth daily as needed., Disp: 14 tablet, Rfl: 0 .  RABEprazole (ACIPHEX) 20 MG tablet, Take 20 mg by mouth daily., Disp: , Rfl:  .  traMADol (ULTRAM) 50 MG tablet, TAKE 1 TABLET (50 MG TOTAL) BY MOUTH EVERY 6 (SIX) HOURS AS NEEDED FOR PAIN FOR UP TO 30 DOSES, Disp: , Rfl:  .  famotidine (PEPCID) 40 MG tablet, Take 40 mg by mouth at bedtime., Disp: , Rfl:  .  hydrochlorothiazide (HYDRODIURIL) 25 MG tablet, Take 1 tablet (25 mg total) by mouth daily., Disp: 90 tablet, Rfl: 3  EXAM:  VITALS per patient if applicable:  GENERAL: alert, oriented, appears well and in no acute distress  HEENT: atraumatic, conjunttiva clear, no obvious abnormalities on inspection of external nose and  ears  NECK: normal movements of the head and neck  LUNGS: on inspection no signs of respiratory distress, breathing rate appears normal, no obvious gross SOB, gasping or wheezing. Coughing in beginning of conversation.   CV: no obvious cyanosis  MS: moves all visible extremities without noticeable abnormality  PSYCH/NEURO: pleasant and cooperative, no obvious depression or anxiety, speech and thought processing grossly intact  ASSESSMENT AND PLAN:  Discussed the following assessment and plan:  Pneumonia due to COVID-19 virus Problem List Items Addressed This Visit      Respiratory   Pneumonia due to COVID-19 virus    Improving.  No acute respiratory distress.  Patient is not labored in her speech.  on 2l O2 sa02 96% she will trial weaning to 1L under evaluation of home health. Repeat labs ,cxr in 6 weeks.Given phone number to medical supply to  get incentive spirometer.  She will let me know how she is doing      Relevant Medications   albuterol (VENTOLIN HFA) 108 (90 Base) MCG/ACT inhaler   benzonatate (TESSALON) 100 MG capsule   dextromethorphan-guaiFENesin (ROBITUSSIN-DM) 10-100 MG/5ML liquid   HYDROcodone-homatropine (HYCODAN) 5-1.5 MG/5ML syrup      -we discussed possible serious and likely etiologies, options for evaluation and workup, limitations of telemedicine visit vs in person visit, treatment, treatment risks and precautions. Pt prefers to treat via telemedicine empirically rather then risking or undertaking an in person visit at this moment. Patient agrees to seek prompt in person care if worsening, new symptoms arise, or if is not improving with treatment.   I discussed the assessment and treatment plan with the patient. The patient was provided an opportunity to ask questions and all were answered. The patient agreed with the plan and demonstrated an understanding of the instructions.   The patient was advised to call back or seek an in-person evaluation if the  symptoms worsen or if the condition fails to improve as anticipated.   Jacqueline Paris, FNP

## 2020-04-24 NOTE — Assessment & Plan Note (Addendum)
Improving.  No acute respiratory distress.  Patient is not labored in her speech.  on 2l O2 sa02 96% she will trial weaning to 1L under evaluation of home health. Repeat labs ,cxr in 6 weeks.Given phone number to medical supply to get incentive spirometer.  She will let me know how she is doing

## 2020-04-24 NOTE — Telephone Encounter (Signed)
Tried to call pt to schedule repeat labs and follow up in 6 weeks with [name] in office but voicemail not set up.

## 2020-04-25 DIAGNOSIS — I10 Essential (primary) hypertension: Secondary | ICD-10-CM | POA: Diagnosis not present

## 2020-04-25 DIAGNOSIS — D6869 Other thrombophilia: Secondary | ICD-10-CM | POA: Diagnosis not present

## 2020-04-25 DIAGNOSIS — U071 COVID-19: Secondary | ICD-10-CM | POA: Diagnosis not present

## 2020-04-25 DIAGNOSIS — J1282 Pneumonia due to coronavirus disease 2019: Secondary | ICD-10-CM | POA: Diagnosis not present

## 2020-04-25 DIAGNOSIS — R439 Unspecified disturbances of smell and taste: Secondary | ICD-10-CM | POA: Diagnosis not present

## 2020-04-25 DIAGNOSIS — J9601 Acute respiratory failure with hypoxia: Secondary | ICD-10-CM | POA: Diagnosis not present

## 2020-04-25 DIAGNOSIS — R630 Anorexia: Secondary | ICD-10-CM | POA: Diagnosis not present

## 2020-04-25 DIAGNOSIS — M791 Myalgia, unspecified site: Secondary | ICD-10-CM | POA: Diagnosis not present

## 2020-04-25 DIAGNOSIS — R519 Headache, unspecified: Secondary | ICD-10-CM | POA: Diagnosis not present

## 2020-04-27 DIAGNOSIS — D6869 Other thrombophilia: Secondary | ICD-10-CM | POA: Diagnosis not present

## 2020-04-27 DIAGNOSIS — J9601 Acute respiratory failure with hypoxia: Secondary | ICD-10-CM | POA: Diagnosis not present

## 2020-04-27 DIAGNOSIS — R439 Unspecified disturbances of smell and taste: Secondary | ICD-10-CM | POA: Diagnosis not present

## 2020-04-27 DIAGNOSIS — R519 Headache, unspecified: Secondary | ICD-10-CM | POA: Diagnosis not present

## 2020-04-27 DIAGNOSIS — I10 Essential (primary) hypertension: Secondary | ICD-10-CM | POA: Diagnosis not present

## 2020-04-27 DIAGNOSIS — R630 Anorexia: Secondary | ICD-10-CM | POA: Diagnosis not present

## 2020-04-27 DIAGNOSIS — J1282 Pneumonia due to coronavirus disease 2019: Secondary | ICD-10-CM | POA: Diagnosis not present

## 2020-04-27 DIAGNOSIS — U071 COVID-19: Secondary | ICD-10-CM | POA: Diagnosis not present

## 2020-04-27 DIAGNOSIS — M791 Myalgia, unspecified site: Secondary | ICD-10-CM | POA: Diagnosis not present

## 2020-05-01 DIAGNOSIS — D6869 Other thrombophilia: Secondary | ICD-10-CM | POA: Diagnosis not present

## 2020-05-01 DIAGNOSIS — J9601 Acute respiratory failure with hypoxia: Secondary | ICD-10-CM | POA: Diagnosis not present

## 2020-05-01 DIAGNOSIS — I10 Essential (primary) hypertension: Secondary | ICD-10-CM | POA: Diagnosis not present

## 2020-05-01 DIAGNOSIS — M791 Myalgia, unspecified site: Secondary | ICD-10-CM | POA: Diagnosis not present

## 2020-05-01 DIAGNOSIS — R519 Headache, unspecified: Secondary | ICD-10-CM | POA: Diagnosis not present

## 2020-05-01 DIAGNOSIS — U071 COVID-19: Secondary | ICD-10-CM | POA: Diagnosis not present

## 2020-05-01 DIAGNOSIS — R630 Anorexia: Secondary | ICD-10-CM | POA: Diagnosis not present

## 2020-05-01 DIAGNOSIS — R439 Unspecified disturbances of smell and taste: Secondary | ICD-10-CM | POA: Diagnosis not present

## 2020-05-01 DIAGNOSIS — J1282 Pneumonia due to coronavirus disease 2019: Secondary | ICD-10-CM | POA: Diagnosis not present

## 2020-05-02 DIAGNOSIS — R630 Anorexia: Secondary | ICD-10-CM | POA: Diagnosis not present

## 2020-05-02 DIAGNOSIS — R439 Unspecified disturbances of smell and taste: Secondary | ICD-10-CM | POA: Diagnosis not present

## 2020-05-02 DIAGNOSIS — D6869 Other thrombophilia: Secondary | ICD-10-CM | POA: Diagnosis not present

## 2020-05-02 DIAGNOSIS — R519 Headache, unspecified: Secondary | ICD-10-CM | POA: Diagnosis not present

## 2020-05-02 DIAGNOSIS — J9601 Acute respiratory failure with hypoxia: Secondary | ICD-10-CM | POA: Diagnosis not present

## 2020-05-02 DIAGNOSIS — U071 COVID-19: Secondary | ICD-10-CM | POA: Diagnosis not present

## 2020-05-02 DIAGNOSIS — I10 Essential (primary) hypertension: Secondary | ICD-10-CM | POA: Diagnosis not present

## 2020-05-02 DIAGNOSIS — M791 Myalgia, unspecified site: Secondary | ICD-10-CM | POA: Diagnosis not present

## 2020-05-02 DIAGNOSIS — J1282 Pneumonia due to coronavirus disease 2019: Secondary | ICD-10-CM | POA: Diagnosis not present

## 2020-05-08 ENCOUNTER — Other Ambulatory Visit: Payer: Self-pay | Admitting: Urology

## 2020-05-09 DIAGNOSIS — U071 COVID-19: Secondary | ICD-10-CM | POA: Diagnosis not present

## 2020-05-09 DIAGNOSIS — M791 Myalgia, unspecified site: Secondary | ICD-10-CM | POA: Diagnosis not present

## 2020-05-09 DIAGNOSIS — R630 Anorexia: Secondary | ICD-10-CM | POA: Diagnosis not present

## 2020-05-09 DIAGNOSIS — J9601 Acute respiratory failure with hypoxia: Secondary | ICD-10-CM | POA: Diagnosis not present

## 2020-05-09 DIAGNOSIS — R519 Headache, unspecified: Secondary | ICD-10-CM | POA: Diagnosis not present

## 2020-05-09 DIAGNOSIS — R439 Unspecified disturbances of smell and taste: Secondary | ICD-10-CM | POA: Diagnosis not present

## 2020-05-09 DIAGNOSIS — I10 Essential (primary) hypertension: Secondary | ICD-10-CM | POA: Diagnosis not present

## 2020-05-09 DIAGNOSIS — D6869 Other thrombophilia: Secondary | ICD-10-CM | POA: Diagnosis not present

## 2020-05-09 DIAGNOSIS — J1282 Pneumonia due to coronavirus disease 2019: Secondary | ICD-10-CM | POA: Diagnosis not present

## 2020-05-11 DIAGNOSIS — U071 COVID-19: Secondary | ICD-10-CM | POA: Diagnosis not present

## 2020-05-11 DIAGNOSIS — J1282 Pneumonia due to coronavirus disease 2019: Secondary | ICD-10-CM | POA: Diagnosis not present

## 2020-05-11 DIAGNOSIS — M791 Myalgia, unspecified site: Secondary | ICD-10-CM | POA: Diagnosis not present

## 2020-05-11 DIAGNOSIS — J9601 Acute respiratory failure with hypoxia: Secondary | ICD-10-CM | POA: Diagnosis not present

## 2020-05-11 DIAGNOSIS — I10 Essential (primary) hypertension: Secondary | ICD-10-CM | POA: Diagnosis not present

## 2020-05-11 DIAGNOSIS — R630 Anorexia: Secondary | ICD-10-CM | POA: Diagnosis not present

## 2020-05-11 DIAGNOSIS — D6869 Other thrombophilia: Secondary | ICD-10-CM | POA: Diagnosis not present

## 2020-05-11 DIAGNOSIS — R519 Headache, unspecified: Secondary | ICD-10-CM | POA: Diagnosis not present

## 2020-05-11 DIAGNOSIS — R439 Unspecified disturbances of smell and taste: Secondary | ICD-10-CM | POA: Diagnosis not present

## 2020-05-15 ENCOUNTER — Telehealth: Payer: Self-pay

## 2020-05-15 ENCOUNTER — Ambulatory Visit: Payer: Medicare HMO

## 2020-05-15 NOTE — Telephone Encounter (Signed)
Unsuccessful attempt to reach patient for scheduled awv. No answer. Unable to leave message. Reschedule appointment as appropriate.

## 2020-05-18 ENCOUNTER — Telehealth: Payer: Self-pay

## 2020-05-18 NOTE — Telephone Encounter (Signed)
Called to confirm appt. Instructed to fill out H&P and bring to office and a list of her medications. Patient with understanding.

## 2020-05-20 NOTE — Progress Notes (Signed)
Patient: Jacqueline Delgado  Service Category: E/M  Provider: Gaspar Cola, MD  DOB: November 23, 1950  DOS: 05/22/2020  Referring Provider: Burnard Hawthorne, FNP  MRN: [id number]  Setting: Ambulatory outpatient  PCP: Burnard Hawthorne, FNP  Type: New Patient  Specialty: Interventional Pain Management    Location: Office  Delivery: Face-to-face     Primary Reason(s) for Visit: Encounter for initial evaluation of one or more chronic problems (new to examiner) potentially causing chronic pain, and posing a threat to normal musculoskeletal function. (Level of risk: High) CC: Back Pain (left knee)  HPI  Jacqueline Delgado is a 69 y.o. year old, female patient, who comes today to see [place] for the first time for an initial evaluation of her chronic pain. She has Depression, major, recurrent, moderate ([place]); Plantar fasciitis; Cough; Genital herpes simplex; Essential hypertension; GERD (gastroesophageal reflux disease); Localized swelling of finger of left hand; OSA (obstructive sleep apnea); Fatigue; Skin fissures; Dark urine; Hot flashes; Screening for breast cancer; SOB (shortness of breath) on exertion; Anemia; B12 deficiency; Vitamin D deficiency; Vaginal discharge; Acquired trigger finger; Adenomatous colon polyp; Closed nondisplaced fracture of base of fifth metacarpal bone of right hand; Complete tear of right rotator cuff; DDD (degenerative disc disease), cervical; DDD (degenerative disc disease), lumbar; Hiatal hernia; Leukocytoclastic vasculitis ([place]); Small vessel vasculitis ([place]); Chronic low back pain (1ry area of Pain) (Bilateral) (R>L) w/ sciatica (Right); Neck pain; Non-cardiac chest pain; S/P rotator cuff repair; Osteoarthritis, knee; Unspecified osteoarthritis, unspecified site; Primary osteoarthritis of left knee; Cerebral vascular malformation; Essential (primary) hypertension; Ankle swelling, right; Pneumonia due to COVID-19 virus; Chronic pain syndrome; Pharmacologic therapy; Disorder of skeletal system;  Problems influencing health status; DDD (degenerative disc disease), lumbosacral; Lumbar facet hypertrophy; Abnormal MRI, lumbar spine (04/12/2013); Lumbosacral central spinal stenosis (9 mm) (L4-5 and L5-S1); Chronic lower extremity pain (Right); Lumbosacral radiculopathy at L5 (Right); Chronic knee pain (3ry area of Pain) (Left); Swelling of foot (4th area of Pain) (Right); Chronic lower extremity pain (2ry area of Pain) (Bilateral) (R>L); and Class 3 obesity with alveolar hypoventilation, serious comorbidity, and body mass index (BMI) of 50.0 to 59.9 in adult [facility]) on their problem list. Today she comes in for evaluation of her Back Pain (left knee)  Pain Assessment: Location: Left (Knee) Back (knee) Radiating: At times pain radiaties down to her buttock Onset: More than a month ago Duration: Chronic pain Quality: Aching, Nagging, Sharp, Throbbing Severity: 10-Worst pain ever/10 (subjective, self-reported pain score)  Note: Reported level is inconsistent with clinical observations. Clinically the patient looks like a 68/10 A 5/10 is viewed as "Severe" and described as intense and extremely unpleasant. Associated with frowning face and frequent crying. Pain overwhelms the senses.  Ability to do any activity or maintain social relationships becomes significantly limited. Conversation becomes difficult. Pacing back and forth is common, as getting into a comfortable position is nearly impossible. Pain wakes you up from deep sleep. Physical signs will be obvious: pupillary dilation; increased sweating; goosebumps; brisk reflexes; cold, clammy hands and feet; nausea, vomiting or dry heaves; loss of appetite; significant sleep disturbance with inability to fall asleep or to remain asleep. When persistent, significant weight loss is observed due to the complete loss of appetite and sleep deprivation.  Blood pressure and heart rate becomes significantly elevated. Discrepancy may suggest a "suffering" (psychosocial)  component. When using our objective Pain Scale, levels between 6 and 10/10 are said to belong in an emergency room, as it progressively worsens from a 6/10, described as  severely limiting, requiring emergency care not usually available at an outpatient pain management facility. At a 6/10 level, communication becomes difficult and requires great effort. Assistance to reach the emergency department may be required. Facial flushing and profuse sweating along with potentially dangerous increases in heart rate and blood pressure will be evident. Effect on ADL: limits my daily activities Timing: Constant Modifying factors: sitting BP: 128/88  HR: 90  Onset and Duration: Gradual Cause of pain: Unknown Severity: Getting worse, NAS-11 at its worse: 10/10, NAS-11 at its best: 4/10, NAS-11 now: 10/10 and NAS-11 on the average: 6/10 Timing: Not influenced by the time of the day Aggravating Factors: Climbing, Prolonged standing, Walking and Working Alleviating Factors: Hot packs and Sitting Associated Problems: Depression and Fatigue Quality of Pain: Aching, Sharp and Throbbing Previous Examinations or Tests: MRI scan Previous Treatments: Epidural steroid injections  The patient comes into the clinics today for the first time for a chronic pain management evaluation.  According to the patient she was sent in for evaluation of her low back pain, which has been getting worse.  The patient primary area of pain is that of the lower back (Bilateral) (R>L).  The patient denies any prior surgeries, physical therapy, nerve blocks, or recent x-rays.  The patient's secondary area of pain is that of the lower extremities, (Bilateral), (R>L).  Again she denies any surgeries, physical therapy, nerve blocks, or recent x-rays.  In terms of the lower extremity pain, in the case of the left lower extremity the pain is only in the area of the knee.  In the case of the right lower extremity this pain goes all the way down into  the top of the foot and her big toe, which is numb, following an L5 dermatomal distribution.  Based on the patient's history this would seem to be an L5 radiculopathy on the right side.  The patient's third worst pain is that of the left knee.  She indicates having osteoarthritis of both knees and having had the right knee replaced (TKR).  She does admit to having had that surgery in the knee.  The fourth area pain is that of right foot pain and swelling.  Of significance is the fact that she indicates having experienced bilateral distal leg pains and aches since he was a child.  She indicates that her mother used to treated with aspirin.  On March 15, 2020 she was diagnosed with COVID-19 and hospitalized at [facility] where she spent 5 days.  She indicates having been released around June 23, which does not add up.  She then indicates having been quarantined for approximately 20 days.  She is here today with her mask, indicating that she was never vaccinated for Covid and still is not.  She was told that she could not get that vaccine yet.  Now she is interested in getting vaccinated.  Today she is here with significant cough which she says it is due to itching of her throat.  Today I took the time to provide the patient with information regarding my pain practice. The patient was informed that my practice is divided into two sections: an interventional pain management section, as well as a completely separate and distinct medication management section. I explained that I have procedure days for my interventional therapies, and evaluation days for follow-ups and medication management. Because of the amount of documentation required during both, they are kept separated. This means that there is the possibility that she may be scheduled for  a procedure on one day, and medication management the next. I have also informed her that because of staffing and facility limitations, I no longer take patients for medication  management only. To illustrate the reasons for this, I gave the patient the example of surgeons, and how inappropriate it would be to refer a patient to his/her care, just to write for the post-surgical antibiotics on a surgery done by a different surgeon.   Because interventional pain management is my board-certified specialty, the patient was informed that joining my practice means that they are open to any and all interventional therapies. I made it clear that this does not mean that they will be forced to have any procedures done. What this means is that I believe interventional therapies to be essential part of the diagnosis and proper management of chronic pain conditions. Therefore, patients not interested in these interventional alternatives will be better served under the care of a different practitioner.  The patient was also made aware of my Comprehensive Pain Management Safety Guidelines where by joining my practice, they limit all of their nerve blocks and joint injections to those done by our practice, for as long as we are retained to manage their care.   Historic Controlled Substance Pharmacotherapy Review  PMP and historical list of controlled substances: Hydrocodone/homatroprine solution; tramadol 50 mg; oxycodone/APAP 5/325 Current opioid analgesics: Hydrocodone/homatropine solution #120 mL, 5 mL 4 times daily (20 MME/day) MME/day: 20 mg/day  Historical Monitoring: The patient  reports no history of drug use. List of all UDS Test(s): No results found. List of other Serum/Urine Drug Screening Test(s):  No results found. Historical Background Evaluation: [place] PMP: PDMP reviewed during this encounter. Online review of the past 36-monthperiod conducted.             PMP NARX Score Report:  Narcotic: 220 Sedative: 100 Stimulant: 000 [place] Department of public safety, offender search: (Editor, commissioningInformation) Non-contributory Risk Assessment Profile: Aberrant behavior: None observed or  detected today Risk factors for fatal opioid overdose: None identified today PMP NARX Overdose Risk Score: 140 Fatal overdose hazard ratio (HR): Calculation deferred Non-fatal overdose hazard ratio (HR): Calculation deferred Risk of opioid abuse or dependence: 0.7-3.0% with doses ? 36 MME/day and 6.1-26% with doses ? 120 MME/day. Substance use disorder (SUD) risk level: See below Personal History of Substance Abuse (SUD-Substance use disorder):  Alcohol: Negative  Illegal Drugs: Negative  Rx Drugs: Negative  ORT Risk Level calculation: Low Risk  Opioid Risk Tool - 05/22/20 1152      Family History of Substance Abuse   Alcohol Positive Female    Illegal Drugs Negative    Rx Drugs Negative      Personal History of Substance Abuse   Alcohol Negative    Illegal Drugs Negative    Rx Drugs Negative      Age   Age between 168-45years  No      History of Preadolescent Sexual Abuse   History of Preadolescent Sexual Abuse Negative or Female      Psychological Disease   Psychological Disease Negative    Depression Negative      Total Score   Opioid Risk Tool Scoring 3    Opioid Risk Interpretation Low Risk          ORT Scoring interpretation table:  Score <3 = Low Risk for SUD  Score between 4-7 = Moderate Risk for SUD  Score >8 = High Risk for Opioid Abuse   PHQ-2 Depression  Scale:  Total score:    PHQ-2 Scoring interpretation table: (Score and probability of major depressive disorder)  Score 0 = No depression  Score 1 = 15.4% Probability  Score 2 = 21.1% Probability  Score 3 = 38.4% Probability  Score 4 = 45.5% Probability  Score 5 = 56.4% Probability  Score 6 = 78.6% Probability   PHQ-9 Depression Scale:  Total score:    PHQ-9 Scoring interpretation table:  Score 0-4 = No depression  Score 5-9 = Mild depression  Score 10-14 = Moderate depression  Score 15-19 = Moderately severe depression  Score 20-27 = Severe depression (2.4 times higher risk of SUD and 2.89 times  higher risk of overuse)   Pharmacologic Plan: As per protocol, I have not taken over any controlled substance management, pending the results of ordered tests and/or consults.            Initial impression: Pending review of available data and ordered tests.  Meds   Current Outpatient Medications:  .  buPROPion (WELLBUTRIN XL) 300 MG 24 hr tablet, Take 1 tablet (300 mg total) by mouth every morning., Disp: 90 tablet, Rfl: 2 .  famotidine (PEPCID) 40 MG tablet, Take 40 mg by mouth at bedtime., Disp: , Rfl:  .  losartan (COZAAR) 50 MG tablet, Take 1 tablet (50 mg total) by mouth daily., Disp: 90 tablet, Rfl: 1 .  meloxicam (MOBIC) 15 MG tablet, Take 15 mg by mouth daily., Disp: , Rfl:  .  mirabegron ER (MYRBETRIQ) 50 MG TB24 tablet, Take 1 tablet (50 mg total) by mouth daily., Disp: 30 tablet, Rfl: 11 .  RABEprazole (ACIPHEX) 20 MG tablet, Take 20 mg by mouth daily., Disp: , Rfl:  .  traMADol (ULTRAM) 50 MG tablet, TAKE 1 TABLET (50 MG TOTAL) BY MOUTH EVERY 6 (SIX) HOURS AS NEEDED FOR PAIN FOR UP TO 30 DOSES, Disp: , Rfl:  .  hydrochlorothiazide (HYDRODIURIL) 25 MG tablet, Take 1 tablet (25 mg total) by mouth daily., Disp: 90 tablet, Rfl: 3  Imaging Review  Shoulder Imaging: Shoulder-R MR wo contrast: Results for orders placed during the hospital encounter of 05/17/15 MR Shoulder Right Wo Contrast  Narrative CLINICAL DATA:  Right shoulder, arm and neck pain with limited range of motion after falling 3 weeks ago. No previous relevant surgery. Initial encounter.  EXAM: MRI OF THE RIGHT SHOULDER WITHOUT CONTRAST  TECHNIQUE: Multiplanar, multisequence MR imaging of the shoulder was performed. No intravenous contrast was administered.  COMPARISON:  Radiographs 04/28/2015.  FINDINGS: Rotator cuff: There is a large acute appearing full-thickness insertional tear of the rotator cuff involving the supraspinatus and infraspinous tendons. Both tendons are completely torn and retracted by  approximately 3 cm. The subscapularis and teres minor tendons are intact.  Muscles: There is prominent edema and ill-defined fluid throughout the infraspinous muscle, supporting an acute rotator cuff tear. The supraspinatus muscle demonstrates mild edema. No significant muscular atrophy.  Biceps long head:  Intact and normally positioned.  Acromioclavicular Joint: The acromion is type 2. There are moderate acromioclavicular degenerative changes. There is a moderate to large amount of fluid in the subacromial -subdeltoid bursa, communicating with shoulder joint via the rotator cuff tear.  Glenohumeral Joint: Mild glenohumeral degenerative changes. Moderate-sized joint effusion.  Labrum:  No evidence of labral tear.  Bones: No significant extra-articular osseous findings. There is mild spurring and adjacent reactive edema within the tuberosities.  IMPRESSION: 1. Large full-thickness, acute-appearing insertional tear of the supraspinatus and infraspinous tendons as described. There  is tendon retraction and muscular edema. 2. No evidence of labral or biceps tendon tear. 3. Moderate acromioclavicular degenerative changes.   Electronically Signed By: Richardean Sale M.D. On: 05/17/2015 08:46  Shoulder-R DG: Results for orders placed during the hospital encounter of 04/28/15 DG Shoulder Right  Narrative CLINICAL DATA:  Pain following fall  EXAM: RIGHT SHOULDER - 2+ VIEW  COMPARISON:  None.  FINDINGS: Frontal and Y scapular images were obtained. There is no fracture or dislocation. Joint spaces appear intact. No erosive change.  IMPRESSION: No fracture or dislocation.  No appreciable arthropathic change.   Electronically Signed By: Lowella Grip III M.D. On: 04/28/2015 10:15  Knee Imaging: Knee-R DG 4 views: Results for orders placed during the hospital encounter of 04/28/15 DG Knee Complete 4 Views Right  Narrative CLINICAL DATA:  Status post fall.  Landed  on the right side.  EXAM: RIGHT KNEE - COMPLETE 4+ VIEW  COMPARISON:  None.  FINDINGS: The right knee demonstrates a total knee arthroplasty without evidence of hardware failure complication. There is no significant joint effusion. There is no fracture or dislocation. The alignment is anatomic.  IMPRESSION: No acute osseous injury of the right knee.   Electronically Signed By: Kathreen Devoid On: 04/28/2015 10:14  Foot Imaging: Foot-R DG Complete: Results for orders placed in visit on 05/02/16 DG Foot Complete Right  Narrative See progress note  Foot-L DG Complete: Results for orders placed in visit on 05/02/16  DG Foot Complete Left  Narrative See progress note  Hand Imaging: Hand-R DG Complete: Results for orders placed during the hospital encounter of 04/28/15 DG Hand Complete Right  Narrative CLINICAL DATA:  [place] today.  Right arm pain.  EXAM: RIGHT FOREARM - 2 VIEW; RIGHT HAND - COMPLETE 3+ VIEW  COMPARISON:  None.  FINDINGS: Right forearm:  The wrist and elbow joints are maintained. No acute forearm fracture. No elbow joint effusion.  Right hand:  The joint spaces are maintained. There is a nondisplaced fracture involving the base of the fifth metacarpal. No other definite fractures are identified.  IMPRESSION: Nondisplaced fracture at the base of the fifth metacarpal.  No forearm fracture.   Electronically Signed By: Marijo Sanes M.D. On: 04/28/2015 10:15  Hand-L DG Complete: Results for orders placed during the hospital encounter of 11/16/18 DG Hand Complete Left  Narrative CLINICAL DATA:  Left hand pain, swelling and redness at the base of the thumb radiating into the right wrist for the past 7 days. No known injury. Unable to remove her ring due to swelling.  EXAM: LEFT HAND - COMPLETE 3+ VIEW  COMPARISON:  None.  FINDINGS: Small calcific or ossific density adjacent to the base of the 1st metacarpal. A spur formation at the  articulation of the 2nd metacarpal and trapezium. Mild diffuse soft tissue swelling. Mild spur formation involving the 2nd through 5th DIP joints.  IMPRESSION: Mild diffuse soft tissue swelling and degenerative changes, as described above.   Electronically Signed By: Claudie Revering M.D. On: 11/16/2018 18:48  Complexity Note: Imaging results reviewed. Results shared with Ms. Joines, using Layman's terms.                        ROS  Cardiovascular: No reported cardiovascular signs or symptoms such as High blood pressure, coronary artery disease, abnormal heart rate or rhythm, heart attack, blood thinner therapy or heart weakness and/or failure Pulmonary or Respiratory: No reported pulmonary signs or symptoms such as wheezing  and difficulty taking a deep full breath (Asthma), difficulty blowing air out (Emphysema), coughing up mucus (Bronchitis), persistent dry cough, or temporary stoppage of breathing during sleep Neurological: No reported neurological signs or symptoms such as seizures, abnormal skin sensations, urinary and/or fecal incontinence, being born with an abnormal open spine and/or a tethered spinal cord Psychological-Psychiatric: Depressed Gastrointestinal: No reported gastrointestinal signs or symptoms such as vomiting or evacuating blood, reflux, heartburn, alternating episodes of diarrhea and constipation, inflamed or scarred liver, or pancreas or irrregular and/or infrequent bowel movements Genitourinary: No reported renal or genitourinary signs or symptoms such as difficulty voiding or producing urine, peeing blood, non-functioning kidney, kidney stones, difficulty emptying the bladder, difficulty controlling the flow of urine, or chronic kidney disease Hematological: No reported hematological signs or symptoms such as prolonged bleeding, low or poor functioning platelets, bruising or bleeding easily, hereditary bleeding problems, low energy levels due to low hemoglobin or being  anemic Endocrine: No reported endocrine signs or symptoms such as high or low blood sugar, rapid heart rate due to high thyroid levels, obesity or weight gain due to slow thyroid or thyroid disease Rheumatologic: Joint aches and or swelling due to excess weight (Osteoarthritis) Musculoskeletal: Negative for myasthenia gravis, muscular dystrophy, multiple sclerosis or malignant hyperthermia Work History: Working full time  Allergies  Ms. Grills has No Known Allergies.  Laboratory Chemistry Profile   Renal Lab Results  Component Value Date   BUN 16 04/17/2020   CREATININE 0.91 04/17/2020   BCR 19 09/29/2019   GFR 79.49 09/21/2019   GFRAA >60 04/17/2020   GFRNONAA >60 04/17/2020   SPECGRAV 1.020 03/27/2020   PHUR 6.0 03/27/2020   PROTEINUR Negative 03/27/2020     Electrolytes Lab Results  Component Value Date   NA 138 04/17/2020   K 3.1 (L) 04/17/2020   CL 98 04/17/2020   CALCIUM 8.6 (L) 04/17/2020   MG 2.0 05/22/2020     Hepatic Lab Results  Component Value Date   AST 33 04/17/2020   ALT 40 04/17/2020   ALBUMIN 3.1 (L) 04/17/2020   ALKPHOS 90 04/17/2020   LIPASE 36 10/12/2017     ID Lab Results  Component Value Date   SARSCOV2NAA NEGATIVE 11/17/2019     Bone Lab Results  Component Value Date   VD25OH 14.17 (L) 09/21/2019   25OHVITD1 WILL FOLLOW 05/22/2020   25OHVITD2 WILL FOLLOW 05/22/2020   25OHVITD3 WILL FOLLOW 05/22/2020     Endocrine Lab Results  Component Value Date   GLUCOSE 107 (H) 04/17/2020   GLUCOSEU Negative 03/27/2020   HGBA1C 5.5 09/29/2019   TSH 1.53 09/21/2019     Neuropathy Lab Results  Component Value Date   VITAMINB12 542 05/22/2020   FOLATE 23.5 09/21/2019   HGBA1C 5.5 09/29/2019     CNS No results found for: COLORCSF, APPEARCSF, RBCCOUNTCSF, WBCCSF, POLYSCSF, LYMPHSCSF, EOSCSF, PROTEINCSF, GLUCCSF, JCVIRUS, CSFOLI, IGGCSF, LABACHR, ACETBL, LABACHR, ACETBL   Inflammation (CRP: Acute  ESR: Chronic) Lab Results  Component  Value Date   CRP 15 (H) 05/22/2020   ESRSEDRATE 73 (H) 05/22/2020     Rheumatology Lab Results  Component Value Date   LABURIC 3.7 11/16/2018     Coagulation Lab Results  Component Value Date   PLT 215 04/17/2020     Cardiovascular Lab Results  Component Value Date   TROPONINI <0.03 09/28/2018   HGB 11.4 (L) 04/17/2020   HCT 36.9 04/17/2020     Screening Lab Results  Component Value Date   [place] NEGATIVE 11/17/2019  Cancer No results found for: CEA, CA125, LABCA2   Allergens No results found for: ALMOND, APPLE, ASPARAGUS, AVOCADO, BANANA, BARLEY, BASIL, BAYLEAF, GREENBEAN, LIMABEAN, WHITEBEAN, BEEFIGE, REDBEET, BLUEBERRY, BROCCOLI, CABBAGE, MELON, CARROT, CASEIN, CASHEWNUT, CAULIFLOWER, CELERY     Note: Lab results reviewed.  PFSH  Drug: Ms. Proby  reports no history of drug use. Alcohol:  reports no history of alcohol use. Tobacco:  reports that she has never smoked. She has never used smokeless tobacco. Medical:  has a past medical history of Anginal pain ([place]), Arthritis, Colon polyp, GERD (gastroesophageal reflux disease), Hiatal hernia, History of hiatal hernia, Hypertension, Obesity, Reflux, Sleep apnea, and Small vessel disease ([place]). Family: family history includes Alcoholism in an other family member; Arthritis in an other family member; Breast cancer in her maternal grandmother and another family member; Breast cancer (age of onset: 103) in her mother; Diabetes in an other family member; Heart disease in her father; Hyperlipidemia in her father; Hypertension in her father; Stroke in her father.  Past Surgical History:  Procedure Laterality Date  . ABDOMINAL HYSTERECTOMY     partial; per patient has left ovary  . CHOLECYSTECTOMY    . COLONOSCOPY W/ POLYPECTOMY     adenomatous colon polyp  . COLONOSCOPY WITH PROPOFOL N/A 12/10/2017   Procedure: COLONOSCOPY WITH PROPOFOL;  Surgeon: Manya Silvas, MD;  Location: [facility] ENDOSCOPY;  Service: Endoscopy;   Laterality: N/A;  . ESOPHAGOGASTRODUODENOSCOPY (EGD) WITH PROPOFOL N/A 10/11/2015   Procedure: ESOPHAGOGASTRODUODENOSCOPY (EGD) WITH PROPOFOL;  Surgeon: Manya Silvas, MD;  Location: [facility] ENDOSCOPY;  Service: Endoscopy;  Laterality: N/A;  . ESOPHAGOGASTRODUODENOSCOPY (EGD) WITH PROPOFOL N/A 12/10/2017   Procedure: ESOPHAGOGASTRODUODENOSCOPY (EGD) WITH PROPOFOL;  Surgeon: Manya Silvas, MD;  Location: [facility] ENDOSCOPY;  Service: Endoscopy;  Laterality: N/A;  . JOINT REPLACEMENT Right    knee, Oct 2012  . JOINT REPLACEMENT     hopefully getting left partial knee replacement in July 2016  . SAVORY DILATION N/A 10/11/2015   Procedure: SAVORY DILATION;  Surgeon: Manya Silvas, MD;  Location: [facility] ENDOSCOPY;  Service: Endoscopy;  Laterality: N/A;  . SHOULDER ARTHROSCOPY WITH ROTATOR CUFF REPAIR AND SUBACROMIAL DECOMPRESSION Right 06/14/2015   Procedure: SHOULDER ARTHROSCOPY WITH mini open ROTATOR CUFF REPAIR AND SUBACROMIAL DECOMPRESSION, release long head biceps tendon.;  Surgeon: Leanor Kail, MD;  Location: [facility]S;  Service: Orthopedics;  Laterality: Right;   Active Ambulatory Problems    Diagnosis Date Noted  . Depression, major, recurrent, moderate ([place]) 07/06/2015  . Plantar fasciitis 05/03/2016  . Cough 11/11/2018  . Genital herpes simplex 11/11/2018  . Essential hypertension 11/11/2018  . GERD (gastroesophageal reflux disease) 09/20/2013  . Localized swelling of finger of left hand 11/18/2018  . OSA (obstructive sleep apnea) 04/23/2016  . Fatigue 12/09/2018  . Skin fissures 04/22/2019  . Dark urine 05/14/2019  . Hot flashes 05/14/2019  . Screening for breast cancer 05/14/2019  . SOB (shortness of breath) on exertion 09/23/2019  . Anemia 09/23/2019  . B12 deficiency 09/23/2019  . Vitamin D deficiency 09/23/2019  . Vaginal discharge 10/01/2019  . Acquired trigger finger 11/23/2015  . Adenomatous colon polyp 03/21/2006  . Closed nondisplaced fracture of base of fifth  metacarpal bone of right hand 06/22/2015  . Complete tear of right rotator cuff 05/23/2015  . DDD (degenerative disc disease), cervical 08/03/2014  . DDD (degenerative disc disease), lumbar 08/03/2014  . Hiatal hernia 02/18/2020  . Leukocytoclastic vasculitis ([place]) 08/03/2014  . 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 Chronic low back pain (  1ry area of Pain) (Bilateral) (R>L) w/ sciatica (Right) 09/20/2013  . Neck pain 08/03/2014  . Non-cardiac chest pain 02/18/2020  . S/P rotator cuff repair 06/22/2015  . Osteoarthritis, knee 09/20/2013  . Unspecified osteoarthritis, unspecified site 07/05/2014  . Primary osteoarthritis of left knee 02/22/2015  . Cerebral vascular malformation 08/02/2014  . Essential (primary) hypertension 09/20/2013  . Ankle swelling, right 04/03/2020  . Pneumonia due to COVID-19 virus 04/09/2020  . Chronic pain syndrome 05/22/2020  . Pharmacologic therapy 05/22/2020  . Disorder of skeletal system 05/22/2020  . Problems influencing health status 05/22/2020  . DDD (degenerative disc disease), lumbosacral 05/22/2020  . Lumbar facet hypertrophy 05/22/2020  . Abnormal MRI, lumbar spine (04/12/2013) 05/22/2020  . Lumbosacral central spinal stenosis (9 mm) (L4-5 and L5-S1) 05/22/2020  . Chronic lower extremity pain (Right) 05/22/2020  . Lumbosacral radiculopathy at L5 (Right) 05/22/2020  . Chronic knee pain (3ry area of Pain) (Left) 05/22/2020  . Swelling of foot (4th area of Pain) (Right) 05/22/2020  . Chronic lower extremity pain (2ry area of Pain) (Bilateral) (R>L) 05/23/2020  . Class 3 obesity with alveolar hypoventilation, serious comorbidity, and body mass index (BMI) of 50.0 to 59.9 in adult [facility]) 05/23/2020   Resolved Ambulatory Problems    Diagnosis Date Noted  . No Resolved Ambulatory Problems   Past Medical History:  Diagnosis Date  . Anginal pain ([place])   . Arthritis   . Colon polyp   . History of hiatal hernia   . Hypertension   . Obesity   .  Reflux   . Sleep apnea   . Small vessel disease ([place])    Constitutional Exam  General appearance: Well nourished, well developed, and well hydrated. In no apparent acute distress Vitals:   05/22/20 1138  BP: 128/88  Pulse: 90  Temp: 98.6 F (37 C)  SpO2: 98%  Weight: 290 lb (131.5 kg)  Height: _0  (1.6 m)   BMI Assessment: Estimated body mass index is 51.37 kg/m as calculated from the following:   Height as of this encounter: _1  (1.6 m).   Weight as of this encounter: 290 lb (131.5 kg).  BMI interpretation table: BMI level Category Range association with higher incidence of chronic pain  <18 kg/m2 Underweight   18.5-24.9 kg/m2 Ideal body weight   25-29.9 kg/m2 Overweight Increased incidence by 20%  30-34.9 kg/m2 Obese (Class I) Increased incidence by 68%  35-39.9 kg/m2 Severe obesity (Class II) Increased incidence by 136%  >40 kg/m2 Extreme obesity (Class III) Increased incidence by 254%   Patient's current BMI Ideal Body weight  Body mass index is 51.37 kg/m. Ideal body weight: 52.4 kg (115 lb 8.3 oz) Adjusted ideal body weight: 84.1 kg (185 lb 5 oz)   BMI Readings from Last 4 Encounters:  05/22/20 51.37 kg/m  04/24/20 48.72 kg/m  04/16/20 48.75 kg/m  04/03/20 50.46 kg/m   Wt Readings from Last 4 Encounters:  05/22/20 290 lb (131.5 kg)  04/16/20 284 lb (128.8 kg)  04/03/20 294 lb (133.4 kg)  02/18/20 289 lb (131.1 kg)   Note: The patient is morbidly obese with a BMI of 51.37 kg/m.  Her current weight is 290 pounds and her ideal body weight should actually be around 115 pounds, based on her height.  Assessment  Primary Diagnosis & Pertinent Problem List: The primary encounter diagnosis was Chronic pain syndrome. Diagnoses of Chronic low back pain (1ry area of Pain) (Bilateral) (R>L) w/ sciatica (Right), DDD (degenerative disc disease), lumbosacral, Lumbar facet  hypertrophy, Lumbosacral central spinal stenosis (9 mm) (L4-5 and L5-S1), Chronic lower  extremity pain (2ry area of Pain) (Bilateral) (R>L), Abnormal MRI, lumbar spine (04/12/2013), Chronic lower extremity pain (Right), Lumbosacral radiculopathy at L5 (Right), Chronic knee pain (Left), Swelling of foot (Right), Pharmacologic therapy, Disorder of skeletal system, Problems influencing health status, Class 3 obesity with alveolar hypoventilation, serious comorbidity, and body mass index (BMI) of 50.0 to 59.9 in adult [facility]), Gastroesophageal reflux disease without esophagitis, OSA (obstructive sleep apnea), SOB (shortness of breath) on exertion, and Essential (primary) hypertension were also pertinent to this visit.  Visit Diagnosis (New problems to examiner): 1. Chronic pain syndrome   2. Chronic low back pain (1ry area of Pain) (Bilateral) (R>L) w/ sciatica (Right)   3. DDD (degenerative disc disease), lumbosacral   4. Lumbar facet hypertrophy   5. Lumbosacral central spinal stenosis (9 mm) (L4-5 and L5-S1)   6. Chronic lower extremity pain (2ry area of Pain) (Bilateral) (R>L)   7. Abnormal MRI, lumbar spine (04/12/2013)   8. Chronic lower extremity pain (Right)   9. Lumbosacral radiculopathy at L5 (Right)   10. Chronic knee pain (Left)   11. Swelling of foot (Right)   12. Pharmacologic therapy   13. Disorder of skeletal system   14. Problems influencing health status   15. Class 3 obesity with alveolar hypoventilation, serious comorbidity, and body mass index (BMI) of 50.0 to 59.9 in adult ([place])   16. Gastroesophageal reflux disease without esophagitis   17. OSA (obstructive sleep apnea)   18. SOB (shortness of breath) on exertion   19. Essential (primary) hypertension    Plan of Care (Initial workup plan)  Note: Ms. Gell was reminded that as per protocol, today's visit has been an evaluation only. We have not taken over the patient's controlled substance management.  Problem-specific plan: No problem-specific Assessment & Plan notes found for this encounter.   Lab Orders      Compliance Drug Analysis, Ur     Magnesium     Vitamin B12     Sedimentation rate     25-Hydroxy vitamin D Lcms D2+D3     C-reactive protein  Imaging Orders     DG Lumbar Spine Complete W/Bend     DG Knee 1-2 Views Left     DG Foot Complete Right Referral Orders  No referral(s) requested today   Procedure Orders    No procedure(s) ordered today   Pharmacotherapy (current): Medications ordered:  No orders of the defined types were placed in this encounter.  Medications administered during this visit: Hanako Tipping. [place] had no medications administered during this visit.   Pharmacological management options:  Opioid Analgesics: The patient was informed that there is no guarantee that she would be a candidate for opioid analgesics. The decision will be made following CDC guidelines. This decision will be based on the results of diagnostic studies, as well as Ms. Kehoe's risk profile.   Membrane stabilizer: To be determined at a later time  Muscle relaxant: To be determined at a later time  NSAID: To be determined at a later time  Other analgesic(s): To be determined at a later time   Interventional management options: Ms. Enge was informed that there is no guarantee that she would be a candidate for interventional therapies. The decision will be based on the results of diagnostic studies, as well as Ms. Jose's risk profile.  Procedure(s) under consideration:  At this point, it is fairly clear to me that the patient  has low back pain is probably secondary to osteoarthritis of the lumbar spine secondary to her class III morbid obesity causing facet arthropathy and subsequent low back pain.  However, she does have a component of a right sided L5 radiculopathy likely to be coming from a right L4-5 subarticular/lateral recess stenosis versus a far lateral disc herniation on the right side at the L5-S1 level and/or a right L5-S1 neural foraminal stenosis.  This osteoarthritis is also  affecting both of her knees were she has already had to have a right knee replacement.  She is also very likely to be experiencing hip arthralgias, for the same reason.  Her morbid obesity is having some clear comorbidities including hypoventilation, obstructive sleep apnea, shortness of breath on exertion, hypertension, gastroesophageal reflux disease, etc.  Today we have order some lab work and x-rays, but clearly we need to get this patient involved in a medically supervised weight loss program and if no improvement is observed, then she should seriously consider other options such as bariatric surgery.  Her BMI goal when it comes to pain management studies, would suggest that an appropriate one would be 30 or less.   Provider-requested follow-up: Return for (40-min), (F2F), (2V) Follow-up, (s/p Tests).  Future Appointments  Date Time Provider [place]  06/05/2020  2:30 PM MacDiarmid, Nicki Reaper, MD [facility] None    Note by: Gaspar Cola, MD Date: 05/22/2020; Time: 6:39 PM

## 2020-05-22 ENCOUNTER — Ambulatory Visit
Admission: RE | Admit: 2020-05-22 | Discharge: 2020-05-22 | Disposition: A | Discharge status: Home / Self Care | Payer: Medicare HMO | Source: Ambulatory Visit | Attending: Pain Medicine | Admitting: Pain Medicine

## 2020-05-22 ENCOUNTER — Ambulatory Visit: Payer: Medicare HMO | Admitting: Pain Medicine

## 2020-05-22 ENCOUNTER — Ambulatory Visit: Admission: RE | Admit: 2020-05-22 | Payer: Medicare HMO | Source: Ambulatory Visit

## 2020-05-22 ENCOUNTER — Encounter: Payer: Self-pay | Admitting: Pain Medicine

## 2020-05-22 ENCOUNTER — Other Ambulatory Visit: Payer: Self-pay

## 2020-05-22 VITALS — BP 128/88 | HR 90 | Temp 98.6°F | Ht 63.0 in | Wt 290.0 lb

## 2020-05-22 DIAGNOSIS — M899 Disorder of bone, unspecified: Secondary | ICD-10-CM | POA: Insufficient documentation

## 2020-05-22 DIAGNOSIS — M79605 Pain in left leg: Secondary | ICD-10-CM

## 2020-05-22 DIAGNOSIS — R700 Elevated erythrocyte sedimentation rate: Secondary | ICD-10-CM | POA: Insufficient documentation

## 2020-05-22 DIAGNOSIS — I10 Essential (primary) hypertension: Secondary | ICD-10-CM | POA: Insufficient documentation

## 2020-05-22 DIAGNOSIS — M7731 Calcaneal spur, right foot: Secondary | ICD-10-CM

## 2020-05-22 DIAGNOSIS — M47816 Spondylosis without myelopathy or radiculopathy, lumbar region: Secondary | ICD-10-CM | POA: Insufficient documentation

## 2020-05-22 DIAGNOSIS — G8929 Other chronic pain: Secondary | ICD-10-CM

## 2020-05-22 DIAGNOSIS — Z79899 Other long term (current) drug therapy: Secondary | ICD-10-CM | POA: Diagnosis not present

## 2020-05-22 DIAGNOSIS — M5441 Lumbago with sciatica, right side: Secondary | ICD-10-CM | POA: Insufficient documentation

## 2020-05-22 DIAGNOSIS — M5137 Other intervertebral disc degeneration, lumbosacral region: Secondary | ICD-10-CM | POA: Insufficient documentation

## 2020-05-22 DIAGNOSIS — R937 Abnormal findings on diagnostic imaging of other parts of musculoskeletal system: Secondary | ICD-10-CM

## 2020-05-22 DIAGNOSIS — M79604 Pain in right leg: Secondary | ICD-10-CM | POA: Insufficient documentation

## 2020-05-22 DIAGNOSIS — M4807 Spinal stenosis, lumbosacral region: Secondary | ICD-10-CM | POA: Insufficient documentation

## 2020-05-22 DIAGNOSIS — Z6841 Body Mass Index (BMI) 40.0 and over, adult: Secondary | ICD-10-CM | POA: Insufficient documentation

## 2020-05-22 DIAGNOSIS — M7989 Other specified soft tissue disorders: Secondary | ICD-10-CM | POA: Insufficient documentation

## 2020-05-22 DIAGNOSIS — R7982 Elevated C-reactive protein (CRP): Secondary | ICD-10-CM | POA: Insufficient documentation

## 2020-05-22 DIAGNOSIS — M25562 Pain in left knee: Secondary | ICD-10-CM | POA: Insufficient documentation

## 2020-05-22 DIAGNOSIS — R0602 Shortness of breath: Secondary | ICD-10-CM | POA: Insufficient documentation

## 2020-05-22 DIAGNOSIS — M4310 Spondylolisthesis, site unspecified: Secondary | ICD-10-CM

## 2020-05-22 DIAGNOSIS — M4316 Spondylolisthesis, lumbar region: Secondary | ICD-10-CM | POA: Diagnosis not present

## 2020-05-22 DIAGNOSIS — Z0000 Encounter for general adult medical examination without abnormal findings: Secondary | ICD-10-CM | POA: Insufficient documentation

## 2020-05-22 DIAGNOSIS — Z789 Other specified health status: Secondary | ICD-10-CM | POA: Insufficient documentation

## 2020-05-22 DIAGNOSIS — M2578 Osteophyte, vertebrae: Secondary | ICD-10-CM | POA: Diagnosis not present

## 2020-05-22 DIAGNOSIS — G894 Chronic pain syndrome: Secondary | ICD-10-CM

## 2020-05-22 DIAGNOSIS — M5417 Radiculopathy, lumbosacral region: Secondary | ICD-10-CM

## 2020-05-22 DIAGNOSIS — Q7649 Other congenital malformations of spine, not associated with scoliosis: Secondary | ICD-10-CM | POA: Diagnosis not present

## 2020-05-22 DIAGNOSIS — K219 Gastro-esophageal reflux disease without esophagitis: Secondary | ICD-10-CM | POA: Insufficient documentation

## 2020-05-22 DIAGNOSIS — M1712 Unilateral primary osteoarthritis, left knee: Secondary | ICD-10-CM | POA: Diagnosis not present

## 2020-05-22 DIAGNOSIS — E662 Morbid (severe) obesity with alveolar hypoventilation: Secondary | ICD-10-CM

## 2020-05-22 DIAGNOSIS — G4733 Obstructive sleep apnea (adult) (pediatric): Secondary | ICD-10-CM

## 2020-05-22 NOTE — Progress Notes (Signed)
Safety precautions to be maintained throughout the outpatient stay will include: orient to surroundings, keep bed in low position, maintain call bell within reach at all times, provide assistance with transfer out of bed and ambulation.  

## 2020-05-22 NOTE — Patient Instructions (Addendum)
______________________________________________________________________________________________  Weight Management Required  URGENT: Your weight has been found to be adversely affecting your health.  Dear Jacqueline Delgado:  Your current Estimated body mass index is 51.37 kg/m as calculated from the following:   Height as of this encounter: '5\' 3"'  (1.6 m).   Weight as of this encounter: 290 lb (131.5 kg).  Please use the table below to identify your weight category and associated incidence of chronic pain, secondary to your weight.  Body Mass Index (BMI) Classification BMI level (kg/m2) Category Associated incidence of chronic pain  <18  Underweight   18.5-24.9 Ideal body weight   25-29.9 Overweight  20%  30-34.9 Obese (Class I)  68%  35-39.9 Severe obesity (Class II)  136%  >40 Extreme obesity (Class III)  254%   In addition: You will be considered "Morbidly Obese", if your BMI is above 30 and you have one or more of the following conditions which are known to be caused and/or directly associated with obesity: 1.    Type 2 Diabetes (Which in turn can lead to cardiovascular diseases (CVD), stroke, peripheral vascular diseases (PVD), retinopathy, nephropathy, and neuropathy) 2.    Cardiovascular Disease (High Blood Pressure; Congestive Heart Failure; High Cholesterol; Coronary Artery Disease; Angina; or History of Heart Attacks) 3.    Breathing problems (Asthma; obesity-hypoventilation syndrome; obstructive sleep apnea; chronic inflammatory airway disease; reactive airway disease; or shortness of breath) 4.    Chronic kidney disease 5.    Liver disease (nonalcoholic fatty liver disease) 6.    High blood pressure 7.    Acid reflux (gastroesophageal reflux disease; heartburn) 8.    Osteoarthritis (OA) (with any of the following: hip pain; knee pain; and/or low back pain) 9.    Low back pain (Lumbar Facet Syndrome; and/or Degenerative Disc Disease) 10.  Hip pain (Osteoarthritis of hip) (For  every 1 lbs of added body weight, there is a 2 lbs increase in pressure inside of each hip articulation. 1:2 mechanical relationship) 11.  Knee pain (Osteoarthritis of knee) (For every 1 lbs of added body weight, there is a 4 lbs increase in pressure inside of each knee articulation. 1:4 mechanical relationship) (patients with a BMI>30 kg/m2 were 6.8 times more likely to develop knee OA than normal-weight individuals) 12.  Cancer: Epidemiological studies have shown that obesity is a risk factor for: post-menopausal breast cancer; cancers of the endometrium, colon and kidney cancer; malignant adenomas of the oesophagus. Obese subjects have an approximately 1.5-3.5-fold increased risk of developing these cancers compared with normal-weight subjects, and it has been estimated that between 15 and 45% of these cancers can be attributed to overweight. More recent studies suggest that obesity may also increase the risk of other types of cancer, including pancreatic, hepatic and gallbladder cancer. (Ref: Obesity and cancer. Pischon T, Nthlings U, [employer] H. Proc Nutr Soc. 2008 May;67(2):128-45. doi: 20.3559/R4163845364680321.) The International Agency for Research on Cancer (IARC) has identified 13 cancers associated with overweight and obesity: meningioma, multiple myeloma, adenocarcinoma of the esophagus, and cancers of the thyroid, postmenopausal breast cancer, gallbladder, stomach, liver, pancreas, kidney, ovaries, uterus, colon and rectal (colorectal) cancers. 83 percent of all cancers diagnosed in women and 24 percent of those diagnosed in men are associated with overweight and obesity.  Recommendation: At this point it is urgent that you take a step back and concentrate in loosing weight. Dedicate 100% of your efforts on this task. Nothing else will improve your health more than bringing your weight down and your BMI  to less than 30. If you are here, you probably have chronic pain. Because most chronic pain  patients have difficulty exercising secondary to their pain, you must rely on proper nutrition and diet in order to lose the weight. If your BMI is above 40, you should seriously consider bariatric surgery. A realistic goal is to lose 10% of your body weight over a period of 12 months.  Be honest to yourself, if over time you have unsuccessfully tried to lose weight, then it is time for you to seek professional help and to enter a medically supervised weight management program, and/or undergo bariatric surgery. Stop procrastinating.   Pain management considerations:  1.    Pharmacological Problems: Be advised that the use of opioid analgesics (oxycodone; hydrocodone; morphine; methadone; codeine; and all of their derivatives) have been associated with decreased metabolism and weight gain.  For this reason, should we see that you are unable to lose weight while taking these medications, it may become necessary for [place] to taper down and indefinitely discontinue them.  2.    Technical Problems: The incidence of successful interventional therapies decreases as the patient's BMI increases. It is much more difficult to accomplish a safe and effective interventional therapy on a patient with a BMI above 35. 3.    Radiation Exposure Problems: The x-rays machine, used to accomplish injection therapies, will automatically increase their x-ray output in order to capture an appropriate bone image. This means that radiation exposure increases exponentially with the patient's BMI. (The higher the BMI, the higher the radiation exposure.) Although the level of radiation used at a given time is still safe to the patient, it is not for the physician and/or assisting staff. Unfortunately, radiation exposure is accumulative. Because physicians and the staff have to do procedures and be exposed on a daily basis, this can result in health problems such as cancer and radiation burns. Radiation exposure to the staff is monitored by the  radiation batches that they wear. The exposure levels are reported back to the staff on a quarterly basis. Depending on levels of exposure, physicians and staff may be obligated by law to decrease this exposure. This means that they have the right and obligation to refuse providing therapies where they may be overexposed to radiation. For this reason, physicians may decline to offer therapies such as radiofrequency ablation or implants to patients with a BMI above 40. 4.    Current Trends: Be advised that the current trend is to no longer offer certain therapies to patients with a BMI equal to, or above 35, due to increase perioperative risks, increased technical procedural difficulties, and excessive radiation exposure to healthcare personnel.  ______________________________________________________________________________________________    ____________________________________________________________________________________________  General Risks and Possible Complications  Patient Responsibilities: It is important that you read this as it is part of your informed consent. It is our duty to inform you of the risks and possible complications associated with treatments offered to you. It is your responsibility as a patient to read this and to ask questions about anything that is not clear or that you believe was not covered in this document.  Patients Rights: You have the right to refuse treatment. You also have the right to change your mind, even after initially having agreed to have the treatment done. However, under this last option, if you wait until the last second to change your mind, you may be charged for the materials used up to that point.  Introduction: Medicine is not an  exact science. Everything in Medicine, including the lack of treatment(s), carries the potential for danger, harm, or loss (which is by definition: Risk). In Medicine, a complication is a secondary problem, condition, or  disease that can aggravate an already existing one. All treatments carry the risk of possible complications. The fact that a side effects or complications occurs, does not imply that the treatment was conducted incorrectly. It must be clearly understood that these can happen even when everything is done following the highest safety standards.  No treatment: You can choose not to proceed with the proposed treatment alternative. The PRO(s) would include: avoiding the risk of complications associated with the therapy. The CON(s) would include: not getting any of the treatment benefits. These benefits fall under one of three categories: diagnostic; therapeutic; and/or palliative. Diagnostic benefits include: getting information which can ultimately lead to improvement of the disease or symptom(s). Therapeutic benefits are those associated with the successful treatment of the disease. Finally, palliative benefits are those related to the decrease of the primary symptoms, without necessarily curing the condition (example: decreasing the pain from a flare-up of a chronic condition, such as incurable terminal cancer).  General Risks and Complications: These are associated to most interventional treatments. They can occur alone, or in combination. They fall under one of the following six (6) categories: no benefit or worsening of symptoms; bleeding; infection; nerve damage; allergic reactions; and/or death. 1. No benefits or worsening of symptoms: In Medicine there are no guarantees, only probabilities. No healthcare provider can ever guarantee that a medical treatment will work, they can only state the probability that it may. Furthermore, there is always the possibility that the condition may worsen, either directly, or indirectly, as a consequence of the treatment. 2. Bleeding: This is more common if the patient is taking a blood thinner, either prescription or over the counter (example: Goody Powders, Fish oil,  Aspirin, Garlic, etc.), or if suffering a condition associated with impaired coagulation (example: Hemophilia, cirrhosis of the liver, low platelet counts, etc.). However, even if you do not have one on these, it can still happen. If you have any of these conditions, or take one of these drugs, make sure to notify your treating physician. 3. Infection: This is more common in patients with a compromised immune system, either due to disease (example: diabetes, cancer, human immunodeficiency virus [HIV], etc.), or due to medications or treatments (example: therapies used to treat cancer and rheumatological diseases). However, even if you do not have one on these, it can still happen. If you have any of these conditions, or take one of these drugs, make sure to notify your treating physician. 4. Nerve Damage: This is more common when the treatment is an invasive one, but it can also happen with the use of medications, such as those used in the treatment of cancer. The damage can occur to small secondary nerves, or to large primary ones, such as those in the spinal cord and brain. This damage may be temporary or permanent and it may lead to impairments that can range from temporary numbness to permanent paralysis and/or brain death. 5. Allergic Reactions: Any time a substance or material comes in contact with our body, there is the possibility of an allergic reaction. These can range from a mild skin rash (contact dermatitis) to a severe systemic reaction (anaphylactic reaction), which can result in death. 6. Death: In general, any medical intervention can result in death, most of the time due to an unforeseen complication.  ____________________________________________________________________________________________   ______________________________________________________________________________________________  Specialty Pain Scale  Introduction:  There are significant differences in how pain is reported.  The word pain usually refers to physical pain, but it is also a common synonym of suffering. The medical community uses a scale from 0 (zero) to 10 (ten) to report pain level. Zero (0) is described as "no pain", while ten (10) is described as "the worse pain you can imagine". The problem with this scale is that physical pain is reported along with suffering. Suffering refers to mental pain, or more often yet it refers to any unpleasant feeling, emotion or aversion associated with the perception of harm or threat of harm. It is the psychological component of pain.  Pain Specialists prefer to separate the two components. The pain scale used by this practice is the Verbal Numerical Rating Scale (VNRS-11). This scale is for the physical pain only. DO NOT INCLUDE how your pain psychologically affects you. This scale is for adults 48 years of age and older. It has 11 (eleven) levels. The 1st level is 0/10. This means: "right now, I have no pain". In the context of pain management, it also means: "right now, my physical pain is under control with the current therapy".  General Information:  The scale should reflect your current level of pain. Unless you are specifically asked for the level of your worst pain, or your average pain. If you are asked for one of these two, then it should be understood that it is over the past 24 hours.  Levels 1 (one) through 5 (five) are described below, and can be treated as an outpatient. Ambulatory pain management facilities such as ours are more than adequate to treat these levels. Levels 6 (six) through 10 (ten) are also described below, however, these must be treated as a hospitalized patient. While levels 6 (six) and 7 (seven) may be evaluated at an urgent care facility, levels 8 (eight) through 10 (ten) constitute medical emergencies and as such, they belong in a hospital's emergency department. When having these levels (as described below), do not come to our office. Our  facility is not equipped to manage these levels. Go directly to an urgent care facility or an emergency department to be evaluated.  Definitions:  Activities of Daily Living (ADL): Activities of daily living (ADL or ADLs) is a term used in healthcare to refer to people's daily self-care activities. Health professionals often use a person's ability or inability to perform ADLs as a measurement of their functional status, particularly in regard to people post injury, with disabilities and the elderly. There are two ADL levels: Basic and Instrumental. Basic Activities of Daily Living (BADL  or BADLs) consist of self-care tasks that include: Bathing and showering; personal hygiene and grooming (including brushing/combing/styling hair); dressing; Toilet hygiene (getting to the toilet, cleaning oneself, and getting back up); eating and self-feeding (not including cooking or chewing and swallowing); functional mobility, often referred to as "transferring", as measured by the ability to walk, get in and out of bed, and get into and out of a chair; the broader definition (moving from one place to another while performing activities) is useful for people with different physical abilities who are still able to get around independently. Basic ADLs include the things many people do when they get up in the morning and get ready to go out of the house: get out of bed, go to the toilet, bathe, dress, groom, and eat. On the average, loss of function typically  follows a particular order. Hygiene is the first to go, followed by loss of toilet use and locomotion. The last to go is the ability to eat. When there is only one remaining area in which the person is independent, there is a 62.9% chance that it is eating and only a 3.5% chance that it is hygiene. Instrumental Activities of Daily Living (IADL or IADLs) are not necessary for fundamental functioning, but they let an individual live independently in a community. IADL  consist of tasks that include: cleaning and maintaining the house; home establishment and maintenance; care of others (including selecting and supervising caregivers); care of pets; child rearing; managing money; managing financials (investments, etc.); meal preparation and cleanup; shopping for groceries and necessities; moving within the community; safety procedures and emergency responses; health management and maintenance (taking prescribed medications); and using the telephone or other form of communication.  Instructions:  Most patients tend to report their pain as a combination of two factors, their physical pain and their psychosocial pain. This last one is also known as suffering and it is reflection of how physical pain affects you socially and psychologically. From now on, report them separately.  From this point on, when asked to report your pain level, report only your physical pain. Use the following table for reference.  Pain Clinic Pain Levels (0-5/10)  Pain Level Score  Description  No Pain 0   Mild pain 1 Nagging, annoying, but does not interfere with basic activities of daily living (ADL). Patients are able to eat, bathe, get dressed, toileting (being able to get on and off the toilet and perform personal hygiene functions), transfer (move in and out of bed or a chair without assistance), and maintain continence (able to control bladder and bowel functions). Blood pressure and heart rate are unaffected. A normal heart rate for a healthy adult ranges from 60 to 100 bpm (beats per minute).   Mild to moderate pain 2 Noticeable and distracting. Impossible to hide from other people. More frequent flare-ups. Still possible to adapt and function close to normal. It can be very annoying and may have occasional stronger flare-ups. With discipline, patients may get used to it and adapt.   Moderate pain 3 Interferes significantly with activities of daily living (ADL). It becomes difficult to  feed, bathe, get dressed, get on and off the toilet or to perform personal hygiene functions. Difficult to get in and out of bed or a chair without assistance. Very distracting. With effort, it can be ignored when deeply involved in activities.   Moderately severe pain 4 Impossible to ignore for more than a few minutes. With effort, patients may still be able to manage work or participate in some social activities. Very difficult to concentrate. Signs of autonomic nervous system discharge are evident: dilated pupils (mydriasis); mild sweating (diaphoresis); sleep interference. Heart rate becomes elevated (>115 bpm). Diastolic blood pressure (lower number) rises above 100 mmHg. Patients find relief in laying down and not moving.   Severe pain 5 Intense and extremely unpleasant. Associated with frowning face and frequent crying. Pain overwhelms the senses.  Ability to do any activity or maintain social relationships becomes significantly limited. Conversation becomes difficult. Pacing back and forth is common, as getting into a comfortable position is nearly impossible. Pain wakes you up from deep sleep. Physical signs will be obvious: pupillary dilation; increased sweating; goosebumps; brisk reflexes; cold, clammy hands and feet; nausea, vomiting or dry heaves; loss of appetite; significant sleep disturbance with inability to  fall asleep or to remain asleep. When persistent, significant weight loss is observed due to the complete loss of appetite and sleep deprivation.  Blood pressure and heart rate becomes significantly elevated. Caution: If elevated blood pressure triggers a pounding headache associated with blurred vision, then the patient should immediately seek attention at an urgent or emergency care unit, as these may be signs of an impending stroke.    Emergency Department Pain Levels (6-10/10)  Emergency Room Pain 6 Severely limiting. Requires emergency care and should not be seen or managed at an  outpatient pain management facility. Communication becomes difficult and requires great effort. Assistance to reach the emergency department may be required. Facial flushing and profuse sweating along with potentially dangerous increases in heart rate and blood pressure will be evident.   Distressing pain 7 Self-care is very difficult. Assistance is required to transport, or use restroom. Assistance to reach the emergency department will be required. Tasks requiring coordination, such as bathing and getting dressed become very difficult.   Disabling pain 8 Self-care is no longer possible. At this level, pain is disabling. The individual is unable to do even the most basic activities such as walking, eating, bathing, dressing, transferring to a bed, or toileting. Fine motor skills are lost. It is difficult to think clearly.   Incapacitating pain 9 Pain becomes incapacitating. Thought processing is no longer possible. Difficult to remember your own name. Control of movement and coordination are lost.   The worst pain imaginable 10 At this level, most patients pass out from pain. When this level is reached, collapse of the autonomic nervous system occurs, leading to a sudden drop in blood pressure and heart rate. This in turn results in a temporary and dramatic drop in blood flow to the brain, leading to a loss of consciousness. Fainting is one of the bodys self defense mechanisms. Passing out puts the brain in a calmed state and causes it to shut down for a while, in order to begin the healing process.    Summary: 1.   Refer to this scale when providing [place] with your pain level. 2.   Be accurate and careful when reporting your pain level. This will help with your care. 3.   Over-reporting your pain level will lead to loss of credibility. 4.   Even a level of 1/10 means that there is pain and will be treated at our facility. 5.   High, inaccurate reporting will be documented as Symptom Exaggeration,  leading to loss of credibility and suspicions of possible secondary gains such as obtaining more narcotics, or wanting to appear disabled, for fraudulent reasons. 6.   Only pain levels of 5 or below will be seen at our facility. 7.   Pain levels of 6 and above will be sent to the Emergency Department and the appointment cancelled.  ______________________________________________________________________________________________

## 2020-05-23 DIAGNOSIS — M4310 Spondylolisthesis, site unspecified: Secondary | ICD-10-CM | POA: Insufficient documentation

## 2020-05-23 DIAGNOSIS — M79604 Pain in right leg: Secondary | ICD-10-CM | POA: Insufficient documentation

## 2020-05-23 DIAGNOSIS — M7731 Calcaneal spur, right foot: Secondary | ICD-10-CM | POA: Insufficient documentation

## 2020-05-23 DIAGNOSIS — R700 Elevated erythrocyte sedimentation rate: Secondary | ICD-10-CM | POA: Insufficient documentation

## 2020-05-23 DIAGNOSIS — G8929 Other chronic pain: Secondary | ICD-10-CM | POA: Insufficient documentation

## 2020-05-23 DIAGNOSIS — R7982 Elevated C-reactive protein (CRP): Secondary | ICD-10-CM | POA: Insufficient documentation

## 2020-05-23 DIAGNOSIS — Z6841 Body Mass Index (BMI) 40.0 and over, adult: Secondary | ICD-10-CM | POA: Insufficient documentation

## 2020-05-24 LAB — COMPLIANCE DRUG ANALYSIS, UR

## 2020-05-30 LAB — 25-HYDROXY VITAMIN D LCMS D2+D3
25-Hydroxy, Vitamin D-2: 13 ng/mL
25-Hydroxy, Vitamin D-3: 5.3 ng/mL
25-Hydroxy, Vitamin D: 18 ng/mL — ABNORMAL LOW

## 2020-05-30 LAB — SEDIMENTATION RATE: Sed Rate: 73 mm/hr — ABNORMAL HIGH (ref 0–40)

## 2020-05-30 LAB — VITAMIN B12: Vitamin B-12: 542 pg/mL (ref 232–1245)

## 2020-05-30 LAB — MAGNESIUM: Magnesium: 2 mg/dL (ref 1.6–2.3)

## 2020-05-30 LAB — C-REACTIVE PROTEIN: CRP: 15 mg/L — ABNORMAL HIGH (ref 0–10)

## 2020-06-05 ENCOUNTER — Encounter: Payer: Self-pay | Admitting: Urology

## 2020-06-05 ENCOUNTER — Other Ambulatory Visit: Payer: Self-pay

## 2020-06-05 ENCOUNTER — Ambulatory Visit: Payer: Medicare HMO | Admitting: Urology

## 2020-06-05 VITALS — BP 152/85 | HR 88

## 2020-06-05 DIAGNOSIS — N3946 Mixed incontinence: Secondary | ICD-10-CM

## 2020-06-05 DIAGNOSIS — R319 Hematuria, unspecified: Secondary | ICD-10-CM

## 2020-06-05 MED ORDER — SOLIFENACIN SUCCINATE 5 MG PO TABS
5.0000 mg | ORAL_TABLET | Freq: Every day | ORAL | 11 refills | Status: DC
Start: 2020-06-05 — End: 2020-07-18

## 2020-06-05 MED ORDER — OXYBUTYNIN CHLORIDE ER 10 MG PO TB24
10.0000 mg | ORAL_TABLET | Freq: Every day | ORAL | 11 refills | Status: DC
Start: 2020-06-05 — End: 2020-09-15

## 2020-06-05 NOTE — Progress Notes (Signed)
06/05/2020 2:36 PM   Jacqueline Delgado 24-Jul-1951 [id number]  Referring provider: Burnard Hawthorne, FNP [address] [place],  [place] [address]  No chief complaint on file.   HPI: I was consulted to assist the patient for vaginal discharge and urinary incontinence.  In the middle the night she wakes up with some brownish foul-smelling discharge but she said it smells like urine.  At baseline she leaks with coughing sneezing sometimes bending lifting.  She has urge incontinence especially if she holds it too long.   She does not wear a pad.  Flow sometimes good sometimes less.  Voids every 2 or 3 hours gets up twice at night.  She does feel empty but has dribbling afterwards  Has had a hysterectomy.  I reviewed the chart and 1 out of the last 2 urine cultures in the last several months was positive  Patient does have mixed incontinence not wearing pads.  She may have small volume bedwetting. She has post void dribbling.  Call if urine culture is positive  I will treat as an overactive bladder with mild bedwetting with Myrbetriq 50 mg samples and prescription.  I like to perform cystoscopy for safety reasons in 6 weeks. Patient did not have microscopic hematuria today but says the discharge of the night is brown.  I thought just in case to get a CT hematuria and call if abnormal.  TOday Frequency stable.  Last culture positive.  Patient never had the CT scan. 35% improvement urge incontinence.  Good days and bad days.  Clinically not infected Cystoscopy patient underwent flexible cystoscopy utilizing sterile technique.  Bladder mucosa and trigone were normal.  No stitch foreign body or carcinoma. No blood in urine  PMH: Past Medical History:  Diagnosis Date  . Anginal pain ([place])     3/8-12/21/10  . Arthritis    Osteoarthritis in BLE knee  . Colon polyp   . GERD (gastroesophageal reflux disease)   . Hiatal hernia   . History of hiatal hernia   . Hypertension     controlled  . Obesity   . Reflux   . Sleep apnea    NO CPAP  . Small vessel disease [facility])     Surgical History: Past Surgical History:  Procedure Laterality Date  . ABDOMINAL HYSTERECTOMY     partial; per patient has left ovary  . CHOLECYSTECTOMY    . COLONOSCOPY W/ POLYPECTOMY     adenomatous colon polyp  . COLONOSCOPY WITH PROPOFOL N/A 12/10/2017   Procedure: COLONOSCOPY WITH PROPOFOL;  Surgeon: Manya Silvas, MD;  Location: [facility] ENDOSCOPY;  Service: Endoscopy;  Laterality: N/A;  . ESOPHAGOGASTRODUODENOSCOPY (EGD) WITH PROPOFOL N/A 10/11/2015   Procedure: ESOPHAGOGASTRODUODENOSCOPY (EGD) WITH PROPOFOL;  Surgeon: Manya Silvas, MD;  Location: [facility] ENDOSCOPY;  Service: Endoscopy;  Laterality: N/A;  . ESOPHAGOGASTRODUODENOSCOPY (EGD) WITH PROPOFOL N/A 12/10/2017   Procedure: ESOPHAGOGASTRODUODENOSCOPY (EGD) WITH PROPOFOL;  Surgeon: Manya Silvas, MD;  Location: [facility] ENDOSCOPY;  Service: Endoscopy;  Laterality: N/A;  . JOINT REPLACEMENT Right    knee, Oct 2012  . JOINT REPLACEMENT     hopefully getting left partial knee replacement in July 2016  . SAVORY DILATION N/A 10/11/2015   Procedure: SAVORY DILATION;  Surgeon: Manya Silvas, MD;  Location: [facility] ENDOSCOPY;  Service: Endoscopy;  Laterality: N/A;  . SHOULDER ARTHROSCOPY WITH ROTATOR CUFF REPAIR AND SUBACROMIAL DECOMPRESSION Right 06/14/2015   Procedure: SHOULDER ARTHROSCOPY WITH mini open ROTATOR CUFF REPAIR AND SUBACROMIAL DECOMPRESSION, release long  head biceps tendon.;  Surgeon: Leanor Kail, MD;  Location: [facility] ORS;  Service: Orthopedics;  Laterality: Right;    Home Medications:  Allergies as of 06/05/2020   No Known Allergies     Medication List       Accurate as of June 05, 2020  2:36 PM. If you have any questions, ask your nurse or doctor.        Aciphex 20 MG tablet Generic drug: RABEprazole Take 20 mg by mouth daily.   buPROPion 300 MG 24 hr tablet Commonly known as: Wellbutrin XL Take 1  tablet (300 mg total) by mouth every morning.   famotidine 40 MG tablet Commonly known as: PEPCID Take 40 mg by mouth at bedtime.   hydrochlorothiazide 25 MG tablet Commonly known as: HYDRODIURIL Take 1 tablet (25 mg total) by mouth daily.   losartan 50 MG tablet Commonly known as: COZAAR Take 1 tablet (50 mg total) by mouth daily.   meloxicam 15 MG tablet Commonly known as: MOBIC Take 15 mg by mouth daily.   mirabegron ER 50 MG Tb24 tablet Commonly known as: MYRBETRIQ Take 1 tablet (50 mg total) by mouth daily.   traMADol 50 MG tablet Commonly known as: ULTRAM TAKE 1 TABLET (50 MG TOTAL) BY MOUTH EVERY 6 (SIX) HOURS AS NEEDED FOR PAIN FOR UP TO 30 DOSES       Allergies: No Known Allergies  Family History: Family History  Problem Relation Age of Onset  . Alcoholism Other        brother  . Arthritis Other        parent  . Breast cancer Other        mother and grandmother  . Hyperlipidemia Father   . Heart disease Father   . Stroke Father   . Hypertension Father   . Diabetes Other        parent and other family member  . Breast cancer Mother 74  . Breast cancer Maternal Grandmother        young    Social History:  reports that she has never smoked. She has never used smokeless tobacco. She reports that she does not drink alcohol and does not use drugs.  ROS:                                        Physical Exam: There were no vitals taken for this visit.  Constitutional:  Alert and oriented, No acute distress.  Laboratory Data: Lab Results  Component Value Date   WBC 7.8 04/17/2020   HGB 11.4 (L) 04/17/2020   HCT 36.9 04/17/2020   MCV 82.0 04/17/2020   PLT 215 04/17/2020    Lab Results  Component Value Date   CREATININE 0.91 04/17/2020    No results found for: PSA  No results found for: TESTOSTERONE  Lab Results  Component Value Date   HGBA1C 5.5 09/29/2019    Urinalysis    Component Value Date/Time    COLORURINE YELLOW (A) 10/16/2019 0501   APPEARANCEUR Clear 03/27/2020 1047   LABSPEC 1.028 10/16/2019 0501   LABSPEC 1.023 06/09/2014 0754   PHURINE 6.0 10/16/2019 0501   GLUCOSEU Negative 03/27/2020 1047   GLUCOSEU NEGATIVE 08/04/2019 1012   HGBUR NEGATIVE 10/16/2019 0501   BILIRUBINUR Negative 03/27/2020 1047   BILIRUBINUR Negative 06/09/2014 0754   KETONESUR NEGATIVE 10/16/2019 0501   PROTEINUR Negative 03/27/2020 1047  PROTEINUR NEGATIVE 10/16/2019 0501   UROBILINOGEN 0.2 08/04/2019 1012   NITRITE Negative 03/27/2020 1047   NITRITE NEGATIVE 10/16/2019 0501   LEUKOCYTESUR Trace (A) 03/27/2020 1047   LEUKOCYTESUR NEGATIVE 10/16/2019 0501   LEUKOCYTESUR Trace 06/09/2014 0754    Pertinent Imaging:   Assessment & Plan: Patient is urgency starting a little bit worse and may start wearing a pad.  I gave her a prescription of oxybutynin ER 10 mg 30x11 and Vesicare 5 mg 3x11 and reassess in 8 weeks  There are no diagnoses linked to this encounter.  No follow-ups on file.  Reece Packer, MD  [facility] [address], [place], [place] [address] [phone]

## 2020-06-05 NOTE — Addendum Note (Signed)
Addended by: Verlene Mayer A on: 06/05/2020 03:14 PM   Modules accepted: Orders

## 2020-06-07 LAB — URINALYSIS, COMPLETE
Bilirubin, UA: NEGATIVE
Glucose, UA: NEGATIVE
Ketones, UA: NEGATIVE
Leukocytes,UA: NEGATIVE
Nitrite, UA: NEGATIVE
Protein,UA: NEGATIVE
Specific Gravity, UA: 1.02 (ref 1.005–1.030)
Urobilinogen, Ur: 0.2 mg/dL (ref 0.2–1.0)
pH, UA: 7 (ref 5.0–7.5)

## 2020-06-07 LAB — MICROSCOPIC EXAMINATION

## 2020-06-09 ENCOUNTER — Ambulatory Visit (INDEPENDENT_AMBULATORY_CARE_PROVIDER_SITE_OTHER): Payer: Medicare HMO

## 2020-06-09 VITALS — Ht 63.0 in | Wt 290.0 lb

## 2020-06-09 DIAGNOSIS — Z0000 Encounter for general adult medical examination without abnormal findings: Secondary | ICD-10-CM | POA: Diagnosis not present

## 2020-06-09 NOTE — Progress Notes (Addendum)
Subjective:   Jacqueline Delgado is a 69 y.o. female who presents for Medicare Annual (Subsequent) preventive examination.  Review of Systems    No ROS.  Medicare Wellness Virtual Visit.  Cardiac Risk Factors include: advanced age (>37men, >26 women);hypertension     Objective:    Today's Vitals   06/09/20 1108  Weight: 290 lb (131.5 kg)  Height: 5\' 3"  (1.6 m)   Body mass index is 51.37 kg/m.  Advanced Directives 06/09/2020 04/16/2020 10/16/2019 05/13/2019 04/12/2019 11/16/2018 09/28/2018  Does Patient Have a Medical Advance Directive? No No Yes No No No No  Type of Advance Directive - - [place]  Would patient like information on creating a medical advance directive? No - Patient declined - - No - Patient declined - No - Patient declined -    Current Medications (verified) Outpatient Encounter Medications as of 06/09/2020  Medication Sig  . buPROPion (WELLBUTRIN XL) 300 MG 24 hr tablet Take 1 tablet (300 mg total) by mouth every morning.  . famotidine (PEPCID) 40 MG tablet Take 40 mg by mouth at bedtime.  . hydrochlorothiazide (HYDRODIURIL) 25 MG tablet Take 1 tablet (25 mg total) by mouth daily.  Marland Kitchen losartan (COZAAR) 50 MG tablet Take 1 tablet (50 mg total) by mouth daily.  . meloxicam (MOBIC) 15 MG tablet Take 15 mg by mouth daily.  . mirabegron ER (MYRBETRIQ) 50 MG TB24 tablet Take 1 tablet (50 mg total) by mouth daily.  Marland Kitchen oxybutynin (DITROPAN-XL) 10 MG 24 hr tablet Take 1 tablet (10 mg total) by mouth daily.  . RABEprazole (ACIPHEX) 20 MG tablet Take 20 mg by mouth daily.  . solifenacin (VESICARE) 5 MG tablet Take 1 tablet (5 mg total) by mouth daily.  . traMADol (ULTRAM) 50 MG tablet TAKE 1 TABLET (50 MG TOTAL) BY MOUTH EVERY 6 (SIX) HOURS AS NEEDED FOR PAIN FOR UP TO 30 DOSES   No facility-administered encounter medications on file as of 06/09/2020.    Allergies (verified) Patient has no known allergies.   History: Past Medical History:  Diagnosis  Date  . Anginal pain ([place])     3/8-12/21/10  . Arthritis    Osteoarthritis in BLE knee  . Colon polyp   . GERD (gastroesophageal reflux disease)   . Hiatal hernia   . History of hiatal hernia   . Hypertension    controlled  . Obesity   . Reflux   . Sleep apnea    NO CPAP  . Small vessel disease [facility])    Past Surgical History:  Procedure Laterality Date  . ABDOMINAL HYSTERECTOMY     partial; per patient has left ovary  . CHOLECYSTECTOMY    . COLONOSCOPY W/ POLYPECTOMY     adenomatous colon polyp  . COLONOSCOPY WITH PROPOFOL N/A 12/10/2017   Procedure: COLONOSCOPY WITH PROPOFOL;  Surgeon: Manya Silvas, MD;  Location: [facility] ENDOSCOPY;  Service: Endoscopy;  Laterality: N/A;  . ESOPHAGOGASTRODUODENOSCOPY (EGD) WITH PROPOFOL N/A 10/11/2015   Procedure: ESOPHAGOGASTRODUODENOSCOPY (EGD) WITH PROPOFOL;  Surgeon: Manya Silvas, MD;  Location[facility] ENDOSCOPY;  Service: Endoscopy;  Laterality: N/A;  . ESOPHAGOGASTRODUODENOSCOPY (EGD) WITH PROPOFOL N/A 12/10/2017   Procedure: ESOPHAGOGASTRODUODENOSCOPY (EGD) WITH PROPOFOL;  Surgeon: Manya Silvas, MD;  Location: [facility] ENDOSCOPY;  Service: Endoscopy;  Laterality: N/A;  . JOINT REPLACEMENT Right    knee, Oct 2012  . JOINT REPLACEMENT     hopefully getting left partial knee replacement in July 2016  . SAVORY  DILATION N/A 10/11/2015   Procedure: SAVORY DILATION;  Surgeon: Manya Silvas, MD;  Location: [facility] ENDOSCOPY;  Service: Endoscopy;  Laterality: N/A;  . SHOULDER ARTHROSCOPY WITH ROTATOR CUFF REPAIR AND SUBACROMIAL DECOMPRESSION Right 06/14/2015   Procedure: SHOULDER ARTHROSCOPY WITH mini open ROTATOR CUFF REPAIR AND SUBACROMIAL DECOMPRESSION, release long head biceps tendon.;  Surgeon: Leanor Kail, MD;  Location: [facility] ORS;  Service: Orthopedics;  Laterality: Right;   Family History  Problem Relation Age of Onset  . Alcoholism Other        brother  . Arthritis Other        parent  . Breast cancer Other        mother and  grandmother  . Hyperlipidemia Father   . Heart disease Father   . Stroke Father   . Hypertension Father   . Diabetes Other        parent and other family member  . Breast cancer Mother 34  . Breast cancer Maternal Grandmother        young   Social History   Socioeconomic History  . Marital status: Single    Spouse name: Not on file  . Number of children: Not on file  . Years of education: Not on file  . Highest education level: Not on file  Occupational History  . Not on file  Tobacco Use  . Smoking status: Never Smoker  . Smokeless tobacco: Never Used  Vaping Use  . Vaping Use: Never used  Substance and Sexual Activity  . Alcohol use: No  . Drug use: No  . Sexual activity: Not on file  Other Topics Concern  . Not on file  Social History Narrative   Lost a son on march 8th 20+ years.    March through Mother's Day is a very hard time every year.    Social Determinants of Health   Financial Resource Strain:   . Difficulty of Paying Living Expenses: Not on file  Food Insecurity: No Food Insecurity  . Worried About Charity fundraiser in the Last Year: Never true  . Ran Out of Food in the Last Year: Never true  Transportation Needs: No Transportation Needs  . Lack of Transportation (Medical): No  . Lack of Transportation (Non-Medical): No  Physical Activity:   . Days of Exercise per Week: Not on file  . Minutes of Exercise per Session: Not on file  Stress:   . Feeling of Stress : Not on file  Social Connections:   . Frequency of Communication with Friends and Family: Not on file  . Frequency of Social Gatherings with Friends and Family: Not on file  . Attends Religious Services: Not on file  . Active Member of Clubs or Organizations: Not on file  . Attends Archivist Meetings: Not on file  . Marital Status: Not on file    Tobacco Counseling Counseling given: Not Answered   Clinical Intake:  Pre-visit preparation completed: Yes         Diabetes: No  How often do you need to have someone help you when you read instructions, pamphlets, or other written materials from your doctor or pharmacy?: 1 - Never       Activities of Daily Living In your present state of health, do you have any difficulty performing the following activities: 06/09/2020  Hearing? N  Vision? N  Difficulty concentrating or making decisions? N  Walking or climbing stairs? Y  Comment Chronic back pain. Paces self.  Dressing  or bathing? N  Doing errands, shopping? N  Preparing Food and eating ? N  Using the Toilet? N  In the past six months, have you accidently leaked urine? Y  Comment Followed by Urology  Do you have problems with loss of bowel control? N  Managing your Medications? N  Managing your Finances? N  Housekeeping or managing your Housekeeping? Y  Some recent data might be hidden    Patient Care Team: Burnard Hawthorne, FNP as PCP - General (Family Medicine) Kate Sable, MD as PCP - Cardiology (Cardiology)  Indicate any recent Medical Services you may have received from other than [facility] providers in the past year (date may be approximate).     Assessment:   This is a routine wellness examination for Jacqueline Delgado.  I connected with Jacqueline Delgado today by telephone and verified that I am speaking with the correct person using two identifiers. Location patient: home Location provider: work Persons participating in the virtual visit: patient, Marine scientist.    I discussed the limitations, risks, security and privacy concerns of performing an evaluation and management service by telephone and the availability of in person appointments. The patient expressed understanding and verbally consented to this telephonic visit.    Interactive audio and video telecommunications were attempted between this provider and patient, however failed, due to patient having technical difficulties OR patient did not have access to video capability.  We continued and  completed visit with audio only.  Some vital signs may be absent or patient reported.   Hearing/Vision screen  Hearing Screening   125Hz  250Hz  500Hz  1000Hz  2000Hz  3000Hz  4000Hz  6000Hz  8000Hz   Right ear:           Left ear:           Comments: Patient is able to hear conversational tones without difficulty.  No issues reported. Tinnitus R ear.  Vision Screening Comments: Wears corrective lenses Visual acuity not assessed, virtual visit.  They have seen their ophthalmologist, [facility].     Dietary issues and exercise activities discussed: Current Exercise Habits: The patient does not participate in regular exercise at present  Regular diet  Goals      Patient Stated   .  I would like to lose weight (pt-stated)      Depression Screen PHQ 2/9 Scores 06/09/2020 04/24/2020 12/08/2019 08/11/2019 05/13/2019 05/11/2019 11/18/2018  PHQ - 2 Score 1 1 0 6 1 0 2  PHQ- 9 Score 1 6 0 21 - 0 9    Fall Risk Fall Risk  06/09/2020 05/22/2020 04/24/2020 04/03/2020 12/08/2019  Falls in the past year? - 1 1 0 0  Number falls in past yr: - 1 0 0 0  Injury with Fall? - 1 0 0 0  Comment - bruise on left knee - - -  Follow up Falls evaluation completed - Falls evaluation completed Falls evaluation completed Falls evaluation completed  Comment None since last reported 2 weeks ago - - - -   Handrails in use when climbing stairs? Yes  Home free of loose throw rugs in walkways, pet beds, electrical cords, etc? Yes  Adequate lighting in your home to reduce risk of falls? Yes   ASSISTIVE DEVICES UTILIZED TO PREVENT FALLS:  Life alert? No  Use of a cane, walker or w/c? No   TIMED UP AND GO:  Was the test performed? No . Virtual visit.   Cognitive Function:  Patient is alert and oriented x3.  Denies difficulty focusing, making decisions,  memory loss.  Enjoys brain challenging activities/games.    6CIT Screen 05/13/2019  What Year? 0 points  What month? 0 points  What time? 0 points    Count back from 20 0 points  Months in reverse 0 points    Immunizations Immunization History  Administered Date(s) Administered  . Influenza Whole 05/23/2018  . Pneumococcal Conjugate-13 12/28/2019    Health Maintenance Health Maintenance  Topic Date Due  . DEXA SCAN  Never done  . COVID-19 Vaccine (1) 06/25/2020 (Originally 02/10/1963)  . INFLUENZA VACCINE  09/09/2020 (Originally 05/14/2020)  . TETANUS/TDAP  06/09/2021 (Originally 02/10/1971)  . Hepatitis C Screening  06/09/2021 (Originally 10/11/1951)  . PNA vac Low Risk Adult (2 of 2 - PPSV23) 12/27/2020  . MAMMOGRAM  10/21/2021  . COLONOSCOPY  12/11/2027   Covid vaccine- not completed. Deferred.  Tdap- Made aware this can be received at local pharmacy or health department. Deferred.  Agrees to update immunization record if completed.   Hep C Screening- deferred per patient preference.   Dexa Scan- agrees to call and schedule.   Dental Screening: Recommended annual dental exams for proper oral hygiene. Plans to schedule.   Community Resource Referral / Chronic Care Management: CRR required this visit?  No   CCM required this visit?  No      Plan:   Keep all routine maintenance appointments.   I have personally reviewed and noted the following in the patient's chart:   . Medical and social history . Use of alcohol, tobacco or illicit drugs  . Current medications and supplements . Functional ability and status . Nutritional status . Physical activity . Advanced directives . List of other physicians . Hospitalizations, surgeries, and ER visits in previous 12 months . Vitals . Screenings to include cognitive, depression, and falls . Referrals and appointments  In addition, I have reviewed and discussed with patient certain preventive protocols, quality metrics, and best practice recommendations. A written personalized care plan for preventive services as well as general preventive health recommendations were  provided to patient via mychart.      Varney Biles, LPN   [id number]       Agree with plan. Mable Paris, NP

## 2020-06-09 NOTE — Patient Instructions (Addendum)
Jacqueline Delgado , Thank you for taking time to come for your Medicare Wellness Visit. I appreciate your ongoing commitment to your health goals. Please review the following plan we discussed and let me know if I can assist you in the future.   These are the goals we discussed: Goals      Patient Stated   .  I would like to lose weight (pt-stated)       This is a list of the screening recommended for you and due dates:  Health Maintenance  Topic Date Due  . DEXA scan (bone density measurement)  Never done  . COVID-19 Vaccine (1) 06/25/2020*  . Flu Shot  09/09/2020*  . Tetanus Vaccine  06/09/2021*  .  Hepatitis C: One time screening is recommended by Center for Disease Control  (CDC) for  adults born from [birth year] through 1964.   06/09/2021*  . Pneumonia vaccines (2 of 2 - PPSV23) 12/27/2020  . Mammogram  10/21/2021  . Colon Cancer Screening  12/11/2027  *Topic was postponed. The date shown is not the original due date.     Immunizations Immunization History  Administered Date(s) Administered  . Influenza Whole 05/23/2018  . Pneumococcal Conjugate-13 12/28/2019    Advanced directives: declined  Conditions/risks identified: none new  Follow up in one year for your annual wellness visit    Preventive Care 65 Years and Older, Female Preventive care refers to lifestyle choices and visits with your health care provider that can promote health and wellness. What does preventive care include?  A yearly physical exam. This is also called an annual well check.  Dental exams once or twice a year.  Routine eye exams. Ask your health care provider how often you should have your eyes checked.  Personal lifestyle choices, including:  Daily care of your teeth and gums.  Regular physical activity.  Eating a healthy diet.  Avoiding tobacco and drug use.  Limiting alcohol use.  Practicing safe sex.  Taking low-dose aspirin every day.  Taking vitamin and mineral supplements as  recommended by your health care provider. What happens during an annual well check? The services and screenings done by your health care provider during your annual well check will depend on your age, overall health, lifestyle risk factors, and family history of disease. Counseling  Your health care provider may ask you questions about your:  Alcohol use.  Tobacco use.  Drug use.  Emotional well-being.  Home and relationship well-being.  Sexual activity.  Eating habits.  History of falls.  Memory and ability to understand (cognition).  Work and work Statistician.  Reproductive health. Screening  You may have the following tests or measurements:  Height, weight, and BMI.  Blood pressure.  Lipid and cholesterol levels. These may be checked every 5 years, or more frequently if you are over 67 years old.  Skin check.  Lung cancer screening. You may have this screening every year starting at age 48 if you have a 30-pack-year history of smoking and currently smoke or have quit within the past 15 years.  Fecal occult blood test (FOBT) of the stool. You may have this test every year starting at age 32.  Flexible sigmoidoscopy or colonoscopy. You may have a sigmoidoscopy every 5 years or a colonoscopy every 10 years starting at age 44.  Hepatitis C blood test.  Hepatitis B blood test.  Sexually transmitted disease (STD) testing.  Diabetes screening. This is done by checking your blood sugar (glucose) after you have  not eaten for a while (fasting). You may have this done every 1-3 years.  Bone density scan. This is done to screen for osteoporosis. You may have this done starting at age 26.  Mammogram. This may be done every 1-2 years. Talk to your health care provider about how often you should have regular mammograms. Talk with your health care provider about your test results, treatment options, and if necessary, the need for more tests. Vaccines  Your health care  provider may recommend certain vaccines, such as:  Influenza vaccine. This is recommended every year.  Tetanus, diphtheria, and acellular pertussis (Tdap, Td) vaccine. You may need a Td booster every 10 years.  Zoster vaccine. You may need this after age 72.  Pneumococcal 13-valent conjugate (PCV13) vaccine. One dose is recommended after age 36.  Pneumococcal polysaccharide (PPSV23) vaccine. One dose is recommended after age 7. Talk to your health care provider about which screenings and vaccines you need and how often you need them. This information is not intended to replace advice given to you by your health care provider. Make sure you discuss any questions you have with your health care provider. Document Released: 10/27/2015 Document Revised: 06/19/2016 Document Reviewed: 08/01/2015 Elsevier Interactive Patient Education  2017 [place] Prevention in the Home Falls can cause injuries. They can happen to people of all ages. There are many things you can do to make your home safe and to help prevent falls. What can I do on the outside of my home?  Regularly fix the edges of walkways and driveways and fix any cracks.  Remove anything that might make you trip as you walk through a door, such as a raised step or threshold.  Trim any bushes or trees on the path to your home.  Use bright outdoor lighting.  Clear any walking paths of anything that might make someone trip, such as rocks or tools.  Regularly check to see if handrails are loose or broken. Make sure that both sides of any steps have handrails.  Any raised decks and porches should have guardrails on the edges.  Have any leaves, snow, or ice cleared regularly.  Use sand or salt on walking paths during winter.  Clean up any spills in your garage right away. This includes oil or grease spills. What can I do in the bathroom?  Use night lights.  Install grab bars by the toilet and in the tub and shower. Do  not use towel bars as grab bars.  Use non-skid mats or decals in the tub or shower.  If you need to sit down in the shower, use a plastic, non-slip stool.  Keep the floor dry. Clean up any water that spills on the floor as soon as it happens.  Remove soap buildup in the tub or shower regularly.  Attach bath mats securely with double-sided non-slip rug tape.  Do not have throw rugs and other things on the floor that can make you trip. What can I do in the bedroom?  Use night lights.  Make sure that you have a light by your bed that is easy to reach.  Do not use any sheets or blankets that are too big for your bed. They should not hang down onto the floor.  Have a firm chair that has side arms. You can use this for support while you get dressed.  Do not have throw rugs and other things on the floor that can make you trip. What can  I do in the kitchen?  Clean up any spills right away.  Avoid walking on wet floors.  Keep items that you use a lot in easy-to-reach places.  If you need to reach something above you, use a strong step stool that has a grab bar.  Keep electrical cords out of the way.  Do not use floor polish or wax that makes floors slippery. If you must use wax, use non-skid floor wax.  Do not have throw rugs and other things on the floor that can make you trip. What can I do with my stairs?  Do not leave any items on the stairs.  Make sure that there are handrails on both sides of the stairs and use them. Fix handrails that are broken or loose. Make sure that handrails are as long as the stairways.  Check any carpeting to make sure that it is firmly attached to the stairs. Fix any carpet that is loose or worn.  Avoid having throw rugs at the top or bottom of the stairs. If you do have throw rugs, attach them to the floor with carpet tape.  Make sure that you have a light switch at the top of the stairs and the bottom of the stairs. If you do not have them,  ask someone to add them for you. What else can I do to help prevent falls?  Wear shoes that:  Do not have high heels.  Have rubber bottoms.  Are comfortable and fit you well.  Are closed at the toe. Do not wear sandals.  If you use a stepladder:  Make sure that it is fully opened. Do not climb a closed stepladder.  Make sure that both sides of the stepladder are locked into place.  Ask someone to hold it for you, if possible.  Clearly mark and make sure that you can see:  Any grab bars or handrails.  First and last steps.  Where the edge of each step is.  Use tools that help you move around (mobility aids) if they are needed. These include:  Canes.  Walkers.  Scooters.  Crutches.  Turn on the lights when you go into a dark area. Replace any light bulbs as soon as they burn out.  Set up your furniture so you have a clear path. Avoid moving your furniture around.  If any of your floors are uneven, fix them.  If there are any pets around you, be aware of where they are.  Review your medicines with your doctor. Some medicines can make you feel dizzy. This can increase your chance of falling. Ask your doctor what other things that you can do to help prevent falls. This information is not intended to replace advice given to you by your health care provider. Make sure you discuss any questions you have with your health care provider. Document Released: 07/27/2009 Document Revised: 03/07/2016 Document Reviewed: 11/04/2014 Elsevier Interactive Patient Education  2017 [employer].

## 2020-07-17 NOTE — Progress Notes (Signed)
PROVIDER NOTE: Information contained herein reflects review and annotations entered in association with encounter. Interpretation of such information and data should be left to medically-trained personnel. Information provided to patient can be located elsewhere in the medical record under "Patient Instructions". Document created using STT-dictation technology, any transcriptional errors that may result from process are unintentional.    Patient: Jacqueline Delgado  Service Category: E/M  Provider: Gaspar Cola, MD  DOB: [birth year]  DOS: 07/18/2020  Specialty: Interventional Pain Management  MRN: [id number]  Setting: Ambulatory outpatient  PCP: Burnard Hawthorne, FNP  Type: Established Patient    Referring Provider: Burnard Hawthorne, FNP  Location: Office  Delivery: Face-to-face     Primary Reason(s) for Visit: Encounter for evaluation before starting new chronic pain management plan of care (Level of risk: moderate) CC: Back Pain  HPI  Jacqueline Delgado is a 69 y.o. year old, female patient, who comes today for a follow-up evaluation to review the test results and decide on a treatment plan. She has Depression, major, recurrent, moderate ([place]); Plantar fasciitis; Cough; Genital herpes simplex; Essential hypertension; GERD (gastroesophageal reflux disease); Localized swelling of finger of left hand; OSA (obstructive sleep apnea); Fatigue; Skin fissures; Dark urine; Hot flashes; Screening for breast cancer; SOB (shortness of breath) on exertion; Anemia; B12 deficiency; Vitamin D deficiency; Vaginal discharge; Acquired trigger finger; Adenomatous colon polyp; Closed nondisplaced fracture of base of fifth metacarpal bone of right hand; Complete tear of right rotator cuff; DDD (degenerative disc disease), cervical; DDD (degenerative disc disease), lumbar; Hiatal hernia; Leukocytoclastic vasculitis ([place]); Small vessel vasculitis ([place]); Chronic low back pain (1ry area of Pain) (Bilateral) (R>L) w/ sciatica  (Right); Neck pain; Non-cardiac chest pain; S/P rotator cuff repair; Osteoarthritis, knee; Unspecified osteoarthritis, unspecified site; Osteoarthritis of knee (Left); Cerebral vascular malformation; Essential (primary) hypertension; Ankle swelling, right; Pneumonia due to COVID-19 virus; Chronic pain syndrome; Pharmacologic therapy; Disorder of skeletal system; Problems influencing health status; DDD (degenerative disc disease), lumbosacral; Lumbar facet hypertrophy; Abnormal MRI, lumbar spine (04/12/2013); Lumbosacral central spinal stenosis (9 mm) (L4-5 and L5-S1); Chronic lower extremity pain (Right); Lumbosacral radiculopathy at L5 (Right); Chronic knee pain (3ry area of Pain) (Left); Swelling of foot (4th area of Pain) (Right); Chronic lower extremity pain (2ry area of Pain) (Bilateral) (R>L); Class 3 obesity with alveolar hypoventilation, serious comorbidity, and body mass index (BMI) of 50.0 to 59.9 in adult [facility]); Elevated C-reactive protein (CRP); Elevated sed rate; Calcaneal spur of foot (Right); Lumbar Grade 1 Anterolisthesis of L4/L5; Hypokalemia; Hypocalcemia; and Hypoalbuminemia on their problem list. Her primarily concern today is the Back Pain  Pain Assessment: Location: Lower Back Radiating: sometimes down the legs. Onset: More than a month ago Duration: Chronic pain Quality: Squeezing, Constant, Spasm Severity: 10-Worst pain ever/10 (subjective, self-reported pain score)  Effect on ADL: She is feeling down. Timing: Constant Modifying factors: sitting down. BP: 119/89  HR: 94  Jacqueline Delgado comes in today for a follow-up visit after her initial evaluation on 05/22/2020. Today we went over the results of her tests. These were explained in "Layman's terms". During today's appointment we went over my diagnostic impression, as well as the proposed treatment plan.  According to the patient she was sent in for evaluation of her low back pain, which has been getting worse.  The patient primary  area of pain is that of the lower back (Bilateral) (R>L).  The patient denies any prior surgeries, physical therapy, nerve blocks, or recent x-rays.  The patient's secondary area of pain  is that of the lower extremities, (Bilateral), (R>L).  Again she denies any surgeries, physical therapy, nerve blocks, or recent x-rays.  In terms of the lower extremity pain, in the case of the left lower extremity the pain is only in the area of the knee.  In the case of the right lower extremity this pain goes all the way down into the top of the foot and her big toe, which is numb, following an L5 dermatomal distribution.  Based on the patient's history this would seem to be an L5 radiculopathy on the right side.  The patient's third worst pain is that of the left knee.  She indicates having osteoarthritis of both knees and having had the right knee replaced (TKR).  She does admit to having had that surgery in the knee.  The fourth area pain is that of right foot pain and swelling.  Today I took the time to go over the results of the imaging studies and the lab work.  All of the results were explained to the patient in layman's terms.  She indicated understanding.  Several on the results obtained in the lab work to require further follow-up by their primary care physician.  The patient was instructed to contact them in the next couple days to arrange for an appointment.  I have conveyed to the patient the urgency of doing so.  She also ended up having an elevated sed rate and C-reactive protein and therefore today I have ordered further lab work to extend their work-up for possible rheumatological problems.  Provocative physical exam today would suggest midline pain possibly secondary to a discogenic syndrome.  She also has evidence of an anterolisthesis of L4 over L5 with prior study showing spinal stenosis.  She indicates that her primary pain is that of the lower back rather than the legs although she has pain in  both legs, more so on the area of the knee on the left.  I will be scheduling the patient to return for a lumbar epidural steroid injection.  I have also conveyed the urgency of bringing down her BMI to 30 or less.  I have made arrangements for a referral to bariatric surgery and medical weight management so that she can get assistance in doing so.  I have explained to the patient in great detail the mechanism on how her weight is adversely influencing her pain.  The patient was also found to have a vitamin D deficiency and I have sent replacement therapy to her pharmacy.  I will be taking over her tramadol today and I have recommended to start a gabapentin titration at bedtime.  In considering the treatment plan options, Ms. Garduno was reminded that I no longer take patients for medication management only. I asked her to let me know if she had no intention of taking advantage of the interventional therapies, so that we could make arrangements to provide this space to someone interested. I also made it clear that undergoing interventional therapies for the purpose of getting pain medications is very inappropriate on the part of a patient, and it will not be tolerated in this practice. This type of behavior would suggest true addiction and therefore it requires referral to an addiction specialist.   Further details on both, my assessment(s), as well as the proposed treatment plan, please see below.  Controlled Substance Pharmacotherapy Assessment REMS (Risk Evaluation and Mitigation Strategy)  Analgesic: Hydrocodone/homatropine solution #120 mL, 5 mL 4 times daily (20 MME/day) MME/day:  20 mg/day  Pill Count: None expected due to no prior prescriptions written by our practice. Chauncey Fischer, RN  07/18/2020  1:37 PM  Sign when Signing Visit Safety precautions to be maintained throughout the outpatient stay will include: orient to surroundings, keep bed in low position, maintain call bell within reach  at all times, provide assistance with transfer out of bed and ambulation.    Pharmacokinetics: Liberation and absorption (onset of action): WNL Distribution (time to peak effect): WNL Metabolism and excretion (duration of action): WNL         Pharmacodynamics: Desired effects: Analgesia: Ms. Hockley reports >50% benefit. Functional ability: Patient reports that medication allows her to accomplish basic ADLs Clinically meaningful improvement in function (CMIF): Sustained CMIF goals met Perceived effectiveness: Described as relatively effective, allowing for increase in activities of daily living (ADL) Undesirable effects: Side-effects or Adverse reactions: None reported Monitoring: [place] PMP: PDMP reviewed during this encounter. Online review of the past 29-monthperiod previously conducted. Not applicable at this point since we have not taken over the patient's medication management yet. List of other Serum/Urine Drug Screening Test(s):  No results found for: AMPHSCRSER, BARBSCRSER, BENZOSCRSER, COCAINSCRSER, COCAINSCRNUR, PCPSCRSER, THCSCRSER, THCU, CANNABQUANT, [place] E[place]biaList of all UDS test(s) done:  Lab Results  Component Value Date   SUMMARY Note 05/22/2020   Last UDS on record: Summary  Date Value Ref Range Status  05/22/2020 Note  Final    Comment:    ==================================================================== Compliance Drug Analysis, Ur ==================================================================== Test                             Result       Flag       Units  Drug Present and Declared for Prescription Verification   Tramadol                       >3268        EXPECTED   ng/mg creat   O-Desmethyltramadol            2372         EXPECTED   ng/mg creat   N-Desmethyltramadol            1509         EXPECTED   ng/mg creat    Source of tramadol is a prescription medication. O-desmethyltramadol    and N-desmethyltramadol are  expected metabolites of tramadol.    Bupropion                      PRESENT      EXPECTED   Hydroxybupropion               PRESENT      EXPECTED    Hydroxybupropion is an expected metabolite of bupropion.  Drug Present not Declared for Prescription Verification   Dextromethorphan               PRESENT      UNEXPECTED   Dextrorphan/Levorphanol        PRESENT      UNEXPECTED    Dextrorphan is an expected metabolite of dextromethorphan, an over-    the-counter or prescription cough suppressant. Dextrorphan cannot be    distinguished from the scheduled prescription medication levorphanol    by the method used for analysis.  ==================================================================== Test  Result    Flag   Units      Ref Range   Creatinine              153              mg/dL      >=20 ==================================================================== Declared Medications:  The flagging and interpretation on this report are based on the  following declared medications.  Unexpected results may arise from  inaccuracies in the declared medications.   **Note: The testing scope of this panel includes these medications:   Bupropion (Wellbutrin)  Tramadol (Ultram)   **Note: The testing scope of this panel does not include the  following reported medications:   Famotidine (Pepcid)  Hydrochlorothiazide (Hydrodiuril)  Losartan (Cozaar)  Meloxicam (Mobic)  Mirabegron (Myrbetriq)  Rabeprazole (Aciphex) ==================================================================== For clinical consultation, please call [phone]. ====================================================================    UDS interpretation: No unexpected findings.          Medication Assessment Form: Patient introduced to form today Treatment compliance: Treatment may start today if patient agrees with proposed plan. Evaluation of compliance is not applicable at this point Risk  Assessment Profile: Aberrant behavior: See initial evaluations. None observed or detected today Comorbid factors increasing risk of overdose: See initial evaluation. No additional risks detected today Opioid risk tool (ORT):  Opioid Risk  05/22/2020  Alcohol 3  Illegal Drugs 0  Rx Drugs 0  Alcohol 0  Illegal Drugs 0  Rx Drugs 0  Age between 16-45 years  0  History of Preadolescent Sexual Abuse 0  Psychological Disease 0  Depression 0  Opioid Risk Tool Scoring 3  Opioid Risk Interpretation Low Risk    ORT Scoring interpretation table:  Score <3 = Low Risk for SUD  Score between 4-7 = Moderate Risk for SUD  Score >8 = High Risk for Opioid Abuse   Risk of substance use disorder (SUD): Low  Risk Mitigation Strategies:  Patient opioid safety counseling: Completed today. Counseling provided to patient as per "Patient Counseling Document". Document signed by patient, attesting to counseling and understanding Patient-Prescriber Agreement (PPA): Obtained today.  Controlled substance notification to other providers: Written and sent today.  Pharmacologic Plan: Today we may be taking over the patient's pharmacological regimen. See below.             Laboratory Chemistry Profile   Renal Lab Results  Component Value Date   BUN 16 04/17/2020   CREATININE 0.91 04/17/2020   BCR 19 09/29/2019   GFR 79.49 09/21/2019   GFRAA >60 04/17/2020   GFRNONAA >60 04/17/2020   SPECGRAV 1.020 06/05/2020   PHUR 7.0 06/05/2020   PROTEINUR Negative 06/05/2020     Electrolytes Lab Results  Component Value Date   NA 138 04/17/2020   K 3.1 (L) 04/17/2020   CL 98 04/17/2020   CALCIUM 8.6 (L) 04/17/2020   MG 2.0 05/22/2020     Hepatic Lab Results  Component Value Date   AST 33 04/17/2020   ALT 40 04/17/2020   ALBUMIN 3.1 (L) 04/17/2020   ALKPHOS 90 04/17/2020   LIPASE 36 10/12/2017     ID Lab Results  Component Value Date   SARSCOV2NAA NEGATIVE 11/17/2019     Bone Lab Results   Component Value Date   VD25OH 14.17 (L) 09/21/2019   25OHVITD1 18 (L) 05/22/2020   25OHVITD2 13 05/22/2020   25OHVITD3 5.3 05/22/2020     Endocrine Lab Results  Component Value Date   GLUCOSE 107 (H) 04/17/2020  GLUCOSEU Negative 06/05/2020   HGBA1C 5.5 09/29/2019   TSH 1.53 09/21/2019     Neuropathy Lab Results  Component Value Date   VITAMINB12 542 05/22/2020   FOLATE 23.5 09/21/2019   HGBA1C 5.5 09/29/2019     CNS No results found for: COLORCSF, APPEARCSF, RBCCOUNTCSF, WBCCSF, POLYSCSF, LYMPHSCSF, EOSCSF, PROTEINCSF, GLUCCSF, JCVIRUS, CSFOLI, IGGCSF, LABACHR, ACETBL, LABACHR, ACETBL   Inflammation (CRP: Acute  ESR: Chronic) Lab Results  Component Value Date   CRP 15 (H) 05/22/2020   ESRSEDRATE 73 (H) 05/22/2020     Rheumatology Lab Results  Component Value Date   LABURIC 3.7 11/16/2018     Coagulation Lab Results  Component Value Date   PLT 215 04/17/2020     Cardiovascular Lab Results  Component Value Date   TROPONINI <0.03 09/28/2018   HGB 11.4 (L) 04/17/2020   HCT 36.9 04/17/2020     Screening Lab Results  Component Value Date   SARSCOV2NAA NEGATIVE 11/17/2019     Cancer No results found for: CEA, CA125, LABCA2   Allergens No results found for: ALMOND, APPLE, ASPARAGUS, AVOCADO, BANANA, BARLEY, BASIL, BAYLEAF, GREENBEAN, LIMABEAN, WHITEBEAN, BEEFIGE, REDBEET, BLUEBERRY, BROCCOLI, CABBAGE, MELON, CARROT, CASEIN, CASHEWNUT, CAULIFLOWER, CELERY     Note: Lab results reviewed.  Recent Diagnostic Imaging Review  Shoulder Imaging: Shoulder-R MR wo contrast: Results for orders placed during the hospital encounter of 05/17/15 MR Shoulder Right Wo Contrast  Narrative CLINICAL DATA:  Right shoulder, arm and neck pain with limited range of motion after falling 3 weeks ago. No previous relevant surgery. Initial encounter.  EXAM: MRI OF THE RIGHT SHOULDER WITHOUT CONTRAST  TECHNIQUE: Multiplanar, multisequence MR imaging of the shoulder was  performed. No intravenous contrast was administered.  COMPARISON:  Radiographs 04/28/2015.  FINDINGS: Rotator cuff: There is a large acute appearing full-thickness insertional tear of the rotator cuff involving the supraspinatus and infraspinous tendons. Both tendons are completely torn and retracted by approximately 3 cm. The subscapularis and teres minor tendons are intact.  Muscles: There is prominent edema and ill-defined fluid throughout the infraspinous muscle, supporting an acute rotator cuff tear. The supraspinatus muscle demonstrates mild edema. No significant muscular atrophy.  Biceps long head:  Intact and normally positioned.  Acromioclavicular Joint: The acromion is type 2. There are moderate acromioclavicular degenerative changes. There is a moderate to large amount of fluid in the subacromial -subdeltoid bursa, communicating with shoulder joint via the rotator cuff tear.  Glenohumeral Joint: Mild glenohumeral degenerative changes. Moderate-sized joint effusion.  Labrum:  No evidence of labral tear.  Bones: No significant extra-articular osseous findings. There is mild spurring and adjacent reactive edema within the tuberosities.  IMPRESSION: 1. Large full-thickness, acute-appearing insertional tear of the supraspinatus and infraspinous tendons as described. There is tendon retraction and muscular edema. 2. No evidence of labral or biceps tendon tear. 3. Moderate acromioclavicular degenerative changes.   Electronically Signed By: Richardean Sale M.D. On: 05/17/2015 08:46  Shoulder-R DG: Results for orders placed during the hospital encounter of 04/28/15 DG Shoulder Right  Narrative CLINICAL DATA:  Pain following fall  EXAM: RIGHT SHOULDER - 2+ VIEW  COMPARISON:  None.  FINDINGS: Frontal and Y scapular images were obtained. There is no fracture or dislocation. Joint spaces appear intact. No erosive change.  IMPRESSION: No fracture or  dislocation.  No appreciable arthropathic change.   Electronically Signed By: Lowella Grip III M.D. On: 04/28/2015 10:15  Lumbosacral Imaging: Lumbar DG Bending views: Results for orders placed during the hospital encounter of 05/22/20  DG Lumbar Spine Complete W/Bend  Narrative CLINICAL DATA:  Low back pain.  EXAM: LUMBAR SPINE - COMPLETE WITH BENDING VIEWS  COMPARISON:  05/16/2014  FINDINGS: Again demonstrated is a transitional L1 vertebra, followed by 4 non-rib-bearing lumbar vertebrae lumbarized S1 vertebra. Interval minimal anterolisthesis at the L4-5 level, unchanged with flexion and extension. Mild anterior spur formation at that level. Moderate anterior spur formation at the T11-12 level. No fractures or pars defects.  IMPRESSION: 1. Interval minimal anterolisthesis at the L4-5 level with no change with flexion and extension. 2. Mild degenerative changes at the L4-5 level.   Electronically Signed By: Claudie Revering M.D. On: 05/22/2020 20:00  Knee Imaging: Knee-L DG 1-2 views: Results for orders placed during the hospital encounter of 05/22/20 DG Knee 1-2 Views Left  Narrative CLINICAL DATA:  Left knee pain and arthralgia.  EXAM: LEFT KNEE - 1-2 VIEW  COMPARISON:  Left knee MRI dated 01/13/2015  FINDINGS: Marked medial joint space narrowing. Mild medial and lateral spur formation. No effusion.  IMPRESSION: Degenerative changes, as described above.   Electronically Signed By: Claudie Revering M.D. On: 05/22/2020 20:01  Knee-R DG 4 views: Results for orders placed during the hospital encounter of 04/28/15 DG Knee Complete 4 Views Right  Narrative CLINICAL DATA:  Status post fall.  Landed on the right side.  EXAM: RIGHT KNEE - COMPLETE 4+ VIEW  COMPARISON:  None.  FINDINGS: The right knee demonstrates a total knee arthroplasty without evidence of hardware failure complication. There is no significant joint effusion. There is no fracture or  dislocation. The alignment is anatomic.  IMPRESSION: No acute osseous injury of the right knee.   Electronically Signed By: Kathreen Devoid On: 04/28/2015 10:14  Foot Imaging: Foot-R DG Complete: Results for orders placed during the hospital encounter of 05/22/20 DG Foot Complete Right  Narrative CLINICAL DATA:  Chronic right foot pain and swelling. No known injury.  EXAM: RIGHT FOOT COMPLETE - 3+ VIEW  COMPARISON:  Bilateral foot radiographs obtained elsewhere on 05/02/2016.  FINDINGS: Moderate inferior and mild posterior calcaneal spur formation. Mild dorsal soft tissue swelling. No fracture, dislocation or soft tissue gas.  IMPRESSION: 1. Mild dorsal soft tissue swelling. 2. Calcaneal spurs.   Electronically Signed By: Claudie Revering M.D. On: 05/22/2020 19:55  Foot-L DG Complete: Results for orders placed in visit on 05/02/16 DG Foot Complete Left  Narrative See progress note  Hand Imaging: Hand-R DG Complete: Results for orders placed during the hospital encounter of 04/28/15 DG Hand Complete Right  Narrative CLINICAL DATA:  [place] today.  Right arm pain.  EXAM: RIGHT FOREARM - 2 VIEW; RIGHT HAND - COMPLETE 3+ VIEW  COMPARISON:  None.  FINDINGS: Right forearm:  The wrist and elbow joints are maintained. No acute forearm fracture. No elbow joint effusion.  Right hand:  The joint spaces are maintained. There is a nondisplaced fracture involving the base of the fifth metacarpal. No other definite fractures are identified.  IMPRESSION: Nondisplaced fracture at the base of the fifth metacarpal.  No forearm fracture.   Electronically Signed By: Marijo Sanes M.D. On: 04/28/2015 10:15  Hand-L DG Complete: Results for orders placed during the hospital encounter of 11/16/18 DG Hand Complete Left  Narrative CLINICAL DATA:  Left hand pain, swelling and redness at the base of the thumb radiating into the right wrist for the past 7 days. No known  injury. Unable to remove her ring due to swelling.  EXAM: LEFT HAND - COMPLETE 3+ VIEW  COMPARISON:  None.  FINDINGS: Small calcific or ossific density adjacent to the base of the 1st metacarpal. A spur formation at the articulation of the 2nd metacarpal and trapezium. Mild diffuse soft tissue swelling. Mild spur formation involving the 2nd through 5th DIP joints.  IMPRESSION: Mild diffuse soft tissue swelling and degenerative changes, as described above.   Electronically Signed By: Claudie Revering M.D. On: 11/16/2018 18:48  Complexity Note: Imaging results reviewed. Results shared with Ms. Schor, using Layman's terms.                        Meds   Current Outpatient Medications:  .  buPROPion (WELLBUTRIN XL) 300 MG 24 hr tablet, Take 1 tablet (300 mg total) by mouth every morning., Disp: 90 tablet, Rfl: 2 .  famotidine (PEPCID) 40 MG tablet, Take 40 mg by mouth at bedtime., Disp: , Rfl:  .  losartan (COZAAR) 50 MG tablet, Take 1 tablet (50 mg total) by mouth daily., Disp: 90 tablet, Rfl: 1 .  meloxicam (MOBIC) 15 MG tablet, Take 15 mg by mouth daily., Disp: , Rfl:  .  oxybutynin (DITROPAN-XL) 10 MG 24 hr tablet, Take 1 tablet (10 mg total) by mouth daily., Disp: 30 tablet, Rfl: 11 .  RABEprazole (ACIPHEX) 20 MG tablet, Take 20 mg by mouth daily., Disp: , Rfl:  .  traMADol (ULTRAM) 50 MG tablet, Take by mouth every 6 (six) hours as needed., Disp: , Rfl:  .  Cholecalciferol (VITAMIN D3) 125 MCG (5000 UT) CAPS, Take 1 capsule (5,000 Units total) by mouth daily with breakfast. Take along with calcium and magnesium., Disp: 30 capsule, Rfl: 5 .  [START ON 07/20/2020] ergocalciferol (VITAMIN D2) 1.25 MG (50000 UT) capsule, Take 1 capsule (50,000 Units total) by mouth 2 (two) times a week. X 6 weeks., Disp: 12 capsule, Rfl: 0 .  hydrochlorothiazide (HYDRODIURIL) 25 MG tablet, Take 1 tablet (25 mg total) by mouth daily., Disp: 90 tablet, Rfl: 3 .  traMADol (ULTRAM) 50 MG tablet, Take 2  tablets (100 mg total) by mouth every 6 (six) hours as needed for severe pain. Most last 30 days., Disp: 240 tablet, Rfl: 0  ROS  Constitutional: Denies any fever or chills Gastrointestinal: No reported hemesis, hematochezia, vomiting, or acute GI distress Musculoskeletal: Denies any acute onset joint swelling, redness, loss of ROM, or weakness Neurological: No reported episodes of acute onset apraxia, aphasia, dysarthria, agnosia, amnesia, paralysis, loss of coordination, or loss of consciousness  Allergies  Ms. Valade has No Known Allergies.  PFSH  Drug: Ms. Venters  reports no history of drug use. Alcohol:  reports no history of alcohol use. Tobacco:  reports that she has never smoked. She has never used smokeless tobacco. Medical:  has a past medical history of Anginal pain ([place]), Arthritis, Colon polyp, GERD (gastroesophageal reflux disease), Hiatal hernia, History of hiatal hernia, Hypertension, Obesity, Reflux, Sleep apnea, and Small vessel disease ([place]). Surgical: Ms. Ballew  has a past surgical history that includes Joint replacement (Right); Joint replacement; Cholecystectomy; Abdominal hysterectomy; Shoulder arthroscopy with rotator cuff repair and subacromial decompression (Right, 06/14/2015); Esophagogastroduodenoscopy (egd) with propofol (N/A, 10/11/2015); Savory dilation (N/A, 10/11/2015); Colonoscopy w/ polypectomy; Esophagogastroduodenoscopy (egd) with propofol (N/A, 12/10/2017); and Colonoscopy with propofol (N/A, 12/10/2017). Family: family history includes Alcoholism in an other family member; Arthritis in an other family member; Breast cancer in her maternal grandmother and another family member; Breast cancer (age of onset: 65) in her mother; Diabetes in an other  family member; Heart disease in her father; Hyperlipidemia in her father; Hypertension in her father; Stroke in her father.  Constitutional Exam  General appearance: Well nourished, well developed, and well hydrated. In  no apparent acute distress Vitals:   07/18/20 1337  BP: 119/89  Pulse: 94  Temp: 97.7 F (36.5 C)  SpO2: 97%  Weight: 292 lb (132.5 kg)  Height: _0  (1.626 m)   BMI Assessment: Estimated body mass index is 50.12 kg/m as calculated from the following:   Height as of this encounter: _1  (1.626 m).   Weight as of this encounter: 292 lb (132.5 kg).  BMI interpretation table: BMI level Category Range association with higher incidence of chronic pain  <18 kg/m2 Underweight   18.5-24.9 kg/m2 Ideal body weight   25-29.9 kg/m2 Overweight Increased incidence by 20%  30-34.9 kg/m2 Obese (Class I) Increased incidence by 68%  35-39.9 kg/m2 Severe obesity (Class II) Increased incidence by 136%  >40 kg/m2 Extreme obesity (Class III) Increased incidence by 254%   Patient's current BMI Ideal Body weight  Body mass index is 50.12 kg/m. Ideal body weight: 54.7 kg (120 lb 9.5 oz) Adjusted ideal body weight: 85.8 kg (189 lb 2.5 oz)   BMI Readings from Last 4 Encounters:  07/18/20 50.12 kg/m  06/09/20 51.37 kg/m  05/22/20 51.37 kg/m  04/24/20 48.72 kg/m   Wt Readings from Last 4 Encounters:  07/18/20 292 lb (132.5 kg)  06/09/20 290 lb (131.5 kg)  05/22/20 290 lb (131.5 kg)  04/16/20 284 lb (128.8 kg)   Psych/Mental status: Alert, oriented x 3 (person, place, & time)       Eyes: PERLA Respiratory: No evidence of acute respiratory distress  Lumbar Exam  Skin & Axial Inspection: No masses, redness, or swelling Alignment: Symmetrical Functional ROM: Decreased ROM affecting both sides Stability: No instability detected Muscle Tone/Strength: Functionally intact. No obvious neuro-muscular anomalies detected. Sensory (Neurological): Movement-associated pain Palpation: Complains of area being tender to palpation       Provocative Tests: Hyperextension/rotation test: (+) bilaterally for facet joint pain.  Gait & Posture Assessment  Ambulation: Limited Gait: Modified gait pattern  (slower gait speed, wider stride width, and longer stance duration) associated with morbid obesity Posture: Difficulty standing up straight, due to pain   Assessment & Plan  Primary Diagnosis & Pertinent Problem List: The primary encounter diagnosis was Chronic pain syndrome. Diagnoses of Chronic low back pain (1ry area of Pain) (Bilateral) (R>L) w/ sciatica (Right), Chronic lower extremity pain (2ry area of Pain) (Bilateral) (R>L), Chronic knee pain (3ry area of Pain) (Left), Lumbar Grade 1 Anterolisthesis of L4/L5, Lumbosacral central spinal stenosis (9 mm) (L4-5 and L5-S1), Lumbosacral radiculopathy at L5 (Right), Lumbar facet hypertrophy, Class 3 obesity with alveolar hypoventilation, serious comorbidity, and body mass index (BMI) of 50.0 to 59.9 in adult [facility]), Vitamin D deficiency, Elevated C-reactive protein (CRP), Elevated sed rate, Hypokalemia, Hypocalcemia, and Hypoalbuminemia were also pertinent to this visit.  Visit Diagnosis: 1. Chronic pain syndrome   2. Chronic low back pain (1ry area of Pain) (Bilateral) (R>L) w/ sciatica (Right)   3. Chronic lower extremity pain (2ry area of Pain) (Bilateral) (R>L)   4. Chronic knee pain (3ry area of Pain) (Left)   5. Lumbar Grade 1 Anterolisthesis of L4/L5   6. Lumbosacral central spinal stenosis (9 mm) (L4-5 and L5-S1)   7. Lumbosacral radiculopathy at L5 (Right)   8. Lumbar facet hypertrophy   9. Class 3 obesity with alveolar hypoventilation, serious comorbidity, and  body mass index (BMI) of 50.0 to 59.9 in adult ([place])   10. Vitamin D deficiency   11. Elevated C-reactive protein (CRP)   12. Elevated sed rate   13. Hypokalemia   14. Hypocalcemia   15. Hypoalbuminemia    Problems updated and reviewed during this visit: Problem  Hypokalemia  Hypocalcemia  Hypoalbuminemia    Plan of Care  Pharmacotherapy (Medications Ordered): Meds ordered this encounter  Medications  . traMADol (ULTRAM) 50 MG tablet    Sig: Take 2 tablets (100 mg  total) by mouth every 6 (six) hours as needed for severe pain. Most last 30 days.    Dispense:  240 tablet    Refill:  0    Chronic Pain: STOP Act (Not applicable) Fill 1 day early if closed on refill date. Avoid benzodiazepines within 8 hours of opioids  . ergocalciferol (VITAMIN D2) 1.25 MG (50000 UT) capsule    Sig: Take 1 capsule (50,000 Units total) by mouth 2 (two) times a week. X 6 weeks.    Dispense:  12 capsule    Refill:  0    Fill one day early if pharmacy is closed on scheduled refill date. May substitute for generic, or similar, if available.  . Cholecalciferol (VITAMIN D3) 125 MCG (5000 UT) CAPS    Sig: Take 1 capsule (5,000 Units total) by mouth daily with breakfast. Take along with calcium and magnesium.    Dispense:  30 capsule    Refill:  5    Fill one day early if pharmacy is closed on scheduled refill date. May substitute for generic, or similar, if available.    Procedure Orders     Lumbar Epidural Injection  Lab Orders     ANA Comprehensive Panel     Rheumatoid factor     Uric acid     Uric acid, random urine Imaging Orders  No imaging studies ordered today    Referral Orders     Amb Ref to Medical Weight Management     Amb Referral to Bariatric Surgery  Pharmacological management options:  Opioid Analgesics: We'll take over management today. See above orders Membrane stabilizer: Options discussed, including a trial. Muscle relaxant: We have discussed the possibility of a trial NSAID: Trial discussed. Other analgesic(s): To be determined at a later time    Interventional management options: Planned, scheduled, and/or pending:    It is clear that the patient has low back pain is probably secondary to osteoarthritis of the lumbar spine secondary to her class III morbid obesity causing facet arthropathy and subsequent low back pain.  However, she does have a component of a right sided L5 radiculopathy likely to be coming from a right L4-5  subarticular/lateral recess stenosis versus a far lateral disc herniation on the right side at the L5-S1 level and/or a right L5-S1 neural foraminal stenosis.  This osteoarthritis is also affecting both of her knees were she has already had to have a right knee replacement.  She is also very likely to be experiencing hip arthralgias, for the same reason.  Her morbid obesity is having some clear comorbidities including hypoventilation, obstructive sleep apnea, shortness of breath on exertion, hypertension, gastroesophageal reflux disease, etc.  This patient needs a medically supervised weight loss program and if no improvement is observed, then she should seriously consider other options such as bariatric surgery.  Her BMI goal  would be 30 or less.   Considering:   Diagnostic midline L4-5 LESI #1  Diagnostic bilateral  lumbar facet block #1    PRN Procedures:   None at this time    Provider-requested follow-up: Return for Procedure (w/ sedation): (ML0 L4-5 LESI #1. Recent Visits Date Type Provider Dept  05/22/20 Office Visit Milinda Pointer, MD [facility]  Showing recent visits within past 90 days and meeting all other requirements Today's Visits Date Type Provider Dept  07/18/20 Office Visit Milinda Pointer, MD [facility]  Showing today's visits and meeting all other requirements Future Appointments No visits were found meeting these conditions. Showing future appointments within next 90 days and meeting all other requirements  Primary Care Physician: Burnard Hawthorne, FNP Note by: Gaspar Cola, MD Date: 07/18/2020; Time: 3:04 PM

## 2020-07-18 ENCOUNTER — Encounter: Payer: Self-pay | Admitting: Pain Medicine

## 2020-07-18 ENCOUNTER — Other Ambulatory Visit: Payer: Self-pay | Admitting: Family

## 2020-07-18 ENCOUNTER — Ambulatory Visit: Payer: Medicare HMO | Attending: Pain Medicine | Admitting: Pain Medicine

## 2020-07-18 ENCOUNTER — Other Ambulatory Visit: Payer: Self-pay

## 2020-07-18 VITALS — BP 119/89 | HR 94 | Temp 97.7°F | Ht 64.0 in | Wt 292.0 lb

## 2020-07-18 DIAGNOSIS — M25562 Pain in left knee: Secondary | ICD-10-CM | POA: Diagnosis not present

## 2020-07-18 DIAGNOSIS — M4807 Spinal stenosis, lumbosacral region: Secondary | ICD-10-CM | POA: Diagnosis not present

## 2020-07-18 DIAGNOSIS — E559 Vitamin D deficiency, unspecified: Secondary | ICD-10-CM

## 2020-07-18 DIAGNOSIS — M5441 Lumbago with sciatica, right side: Secondary | ICD-10-CM | POA: Diagnosis not present

## 2020-07-18 DIAGNOSIS — R700 Elevated erythrocyte sedimentation rate: Secondary | ICD-10-CM | POA: Diagnosis not present

## 2020-07-18 DIAGNOSIS — M79604 Pain in right leg: Secondary | ICD-10-CM | POA: Diagnosis not present

## 2020-07-18 DIAGNOSIS — M5417 Radiculopathy, lumbosacral region: Secondary | ICD-10-CM

## 2020-07-18 DIAGNOSIS — M47816 Spondylosis without myelopathy or radiculopathy, lumbar region: Secondary | ICD-10-CM | POA: Diagnosis not present

## 2020-07-18 DIAGNOSIS — E8809 Other disorders of plasma-protein metabolism, not elsewhere classified: Secondary | ICD-10-CM | POA: Diagnosis present

## 2020-07-18 DIAGNOSIS — M4310 Spondylolisthesis, site unspecified: Secondary | ICD-10-CM | POA: Diagnosis not present

## 2020-07-18 DIAGNOSIS — G894 Chronic pain syndrome: Secondary | ICD-10-CM

## 2020-07-18 DIAGNOSIS — E876 Hypokalemia: Secondary | ICD-10-CM | POA: Diagnosis present

## 2020-07-18 DIAGNOSIS — Z6841 Body Mass Index (BMI) 40.0 and over, adult: Secondary | ICD-10-CM | POA: Diagnosis not present

## 2020-07-18 DIAGNOSIS — R7982 Elevated C-reactive protein (CRP): Secondary | ICD-10-CM

## 2020-07-18 DIAGNOSIS — M79605 Pain in left leg: Secondary | ICD-10-CM

## 2020-07-18 DIAGNOSIS — E662 Morbid (severe) obesity with alveolar hypoventilation: Secondary | ICD-10-CM | POA: Diagnosis not present

## 2020-07-18 DIAGNOSIS — G8929 Other chronic pain: Secondary | ICD-10-CM | POA: Diagnosis not present

## 2020-07-18 IMAGING — DX DG HAND COMPLETE 3+V*L*
3 series · 3 of 3 positions shown · non-contrast
Comparison: None.

CLINICAL DATA: Left hand pain, swelling and redness at the base of
the thumb radiating into the right wrist for the past 7 days. No
known injury. Unable to remove her ring due to swelling.

EXAM:
LEFT HAND - COMPLETE 3+ VIEW

[hand ap]
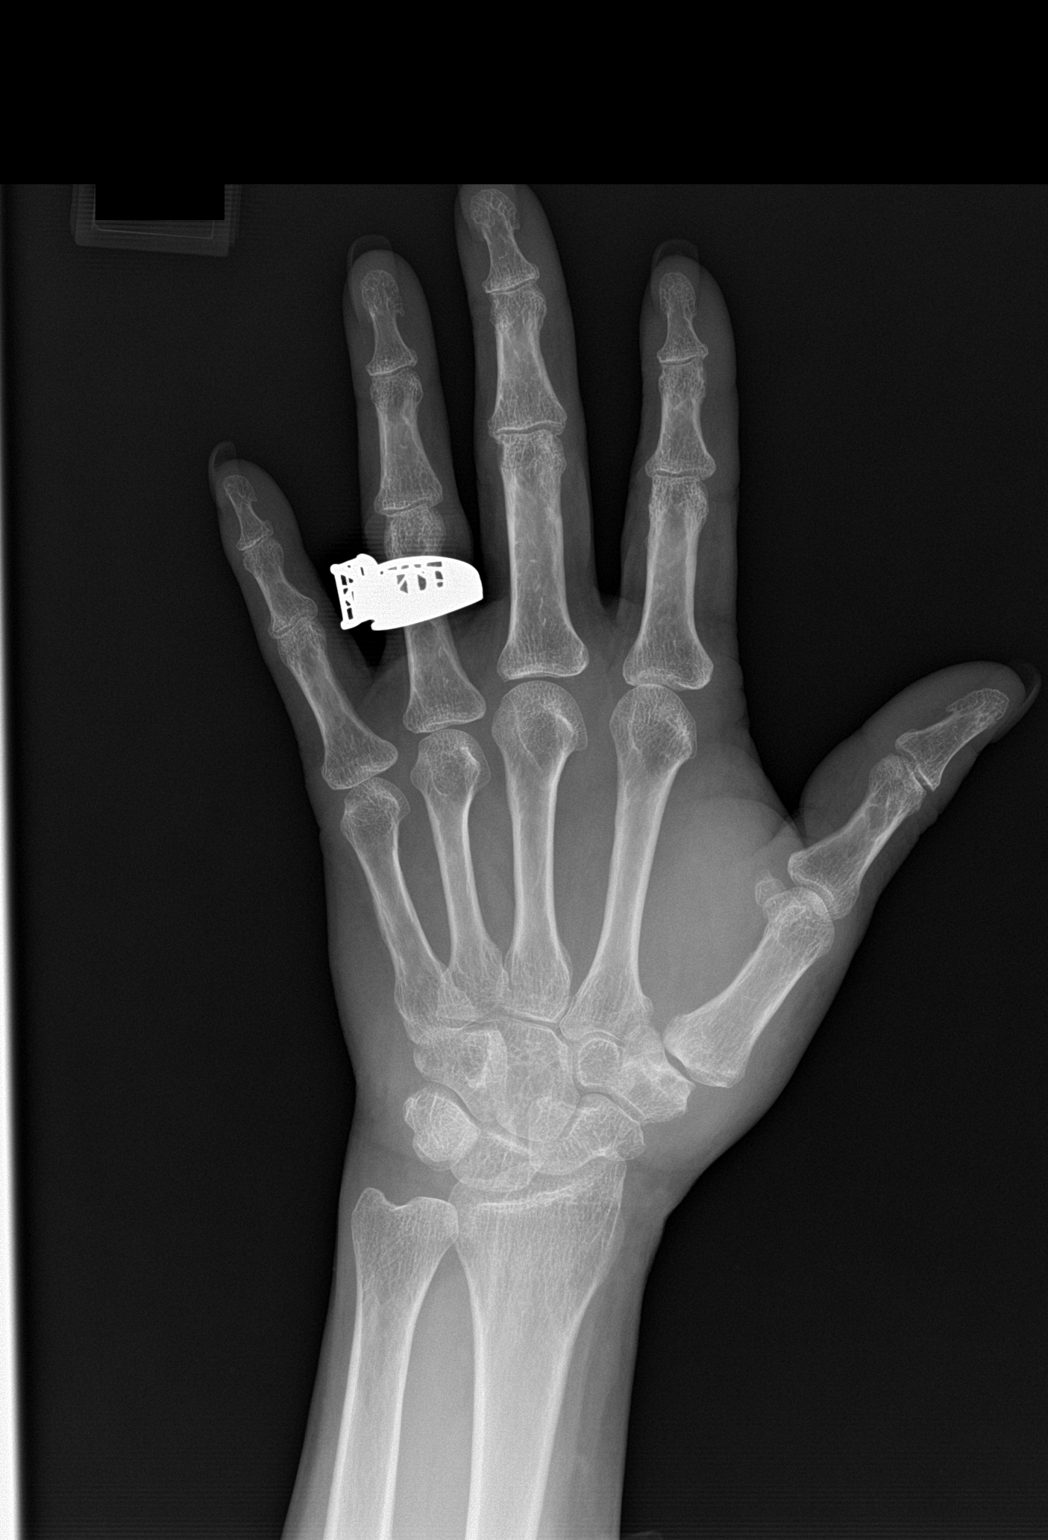

[hand obl]
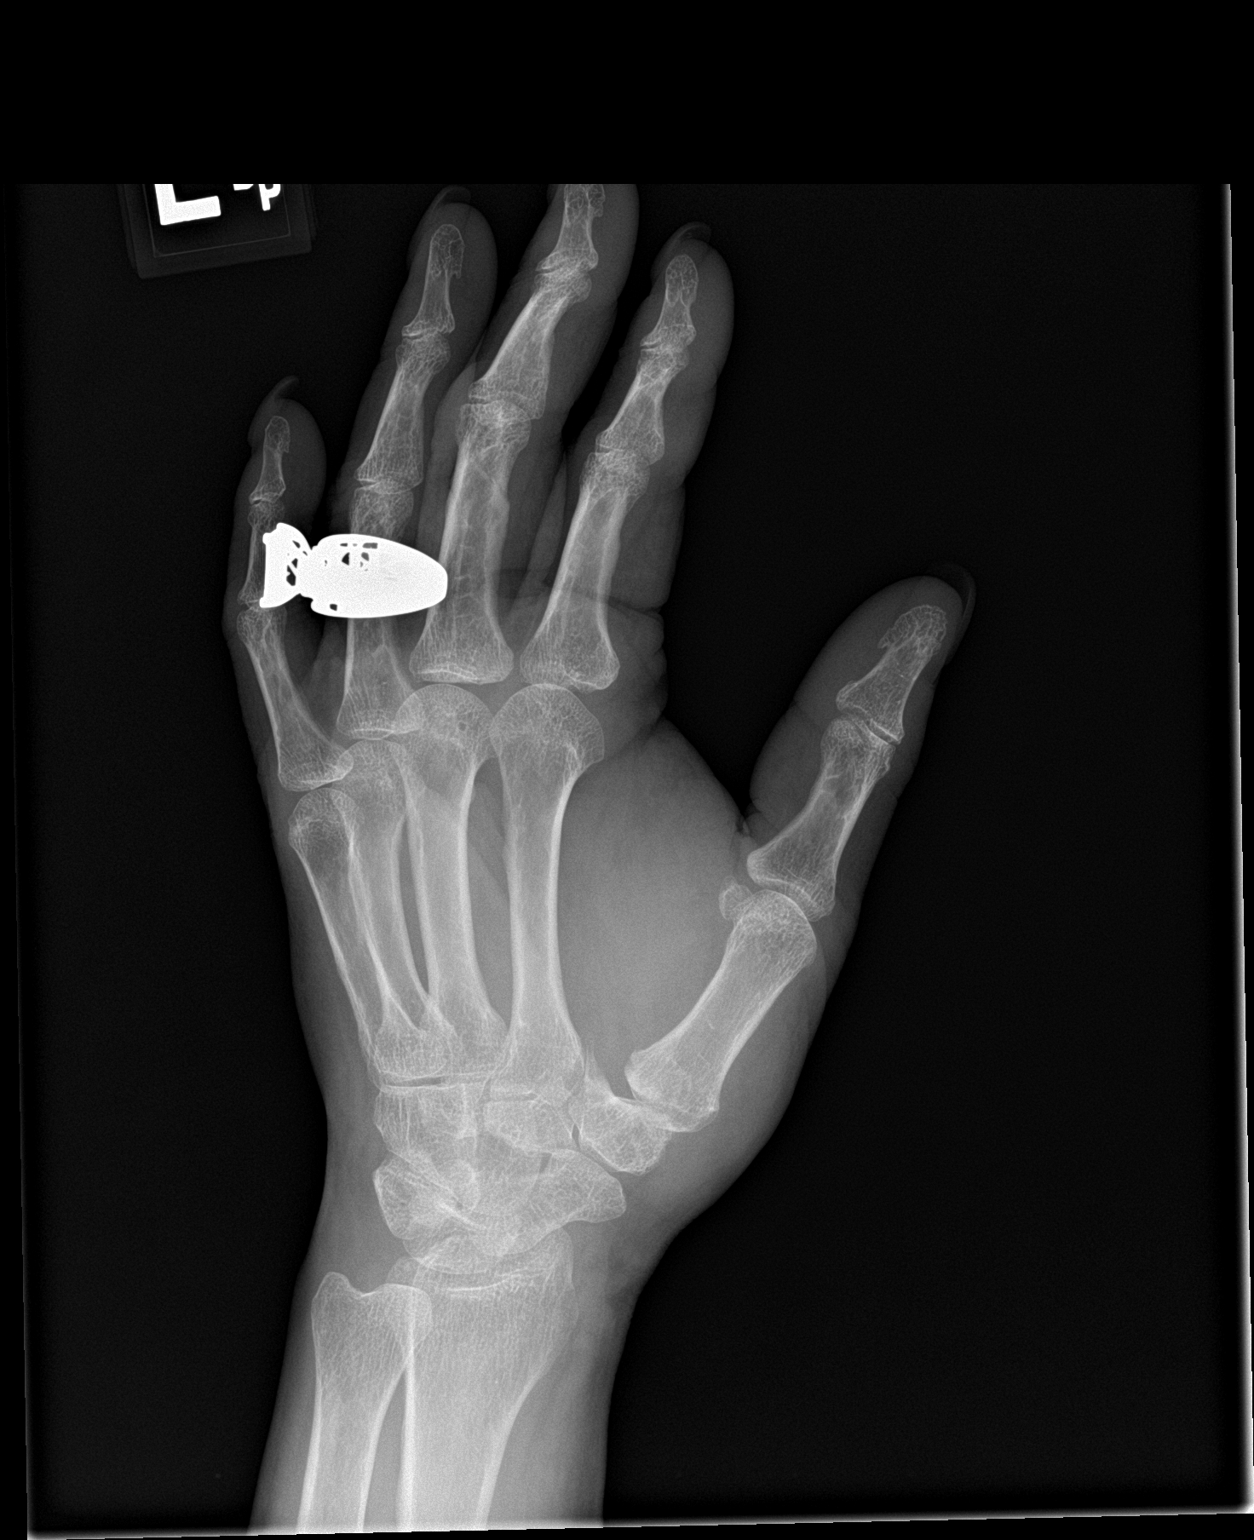

[hand lat]
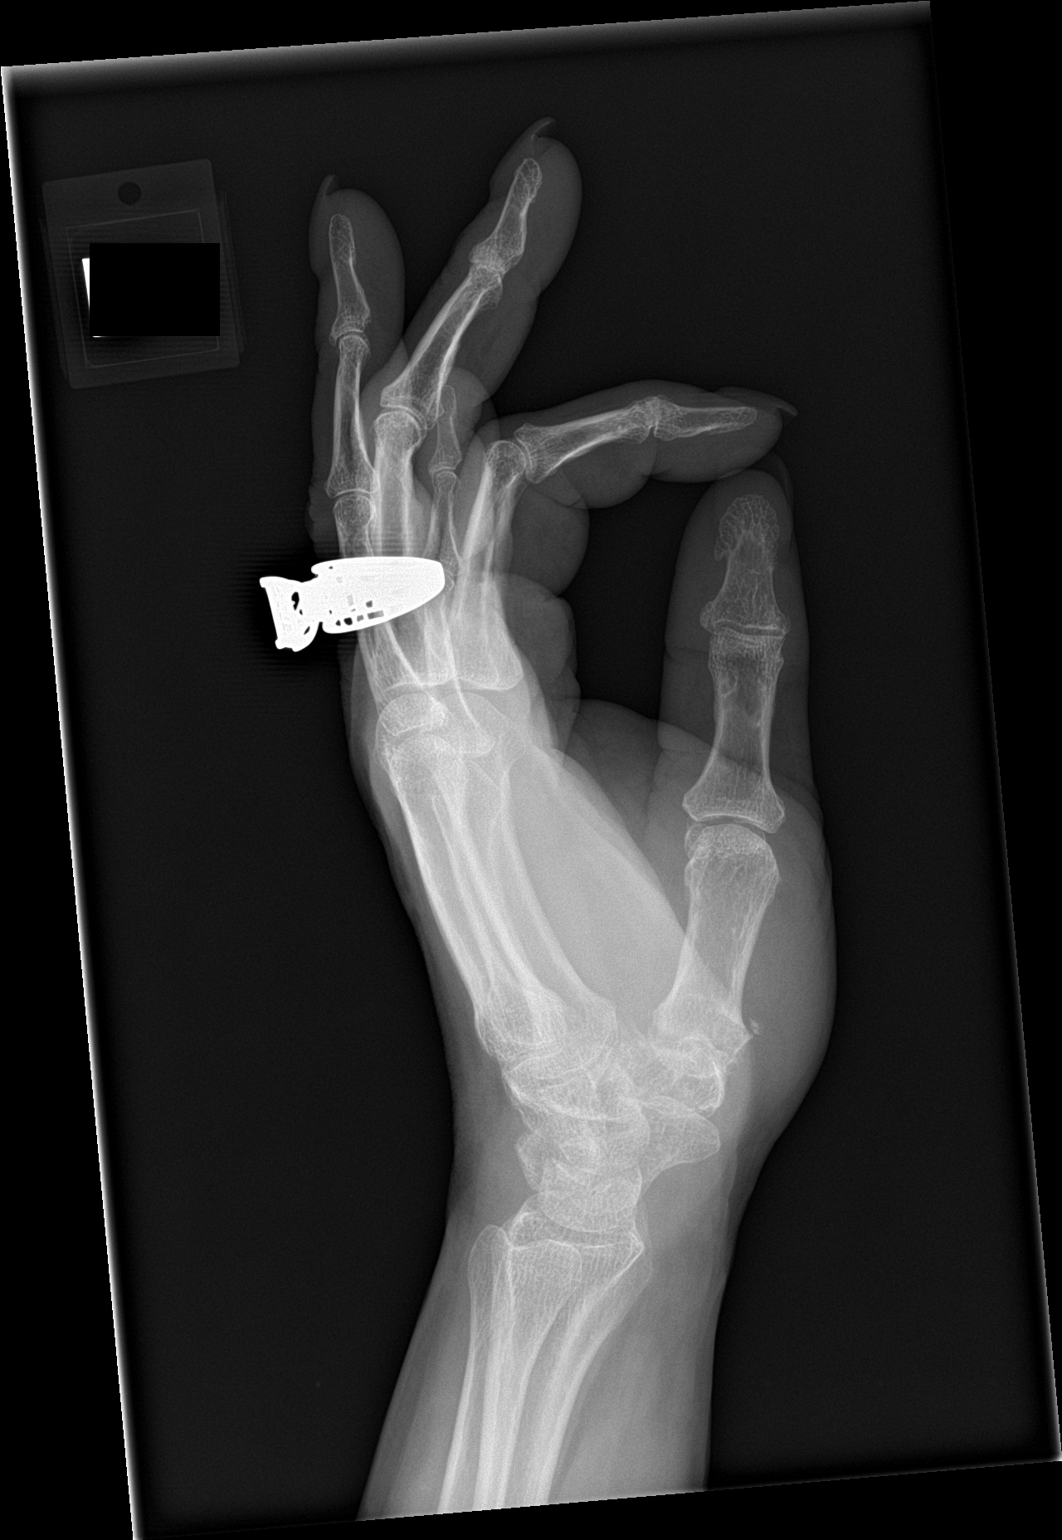

[3 of 3 positions shown; findings below may reference images not displayed]

FINDINGS: Small calcific or ossific density adjacent to the base of the 1st
metacarpal. A spur formation at the articulation of the 2nd
metacarpal and trapezium. Mild diffuse soft tissue swelling. Mild
spur formation involving the 2nd through 5th DIP joints.
IMPRESSION: Mild diffuse soft tissue swelling and degenerative changes, as
described above.

## 2020-07-18 MED ORDER — ERGOCALCIFEROL 1.25 MG (50000 UT) PO CAPS: 50000.0000 [IU] | ORAL_CAPSULE | ORAL | 0 refills | Status: DC

## 2020-07-18 MED ORDER — TRAMADOL HCL 50 MG PO TABS: 100.0000 mg | ORAL_TABLET | Freq: Four times a day (QID) | ORAL | 0 refills | Status: DC | PRN

## 2020-07-18 MED ORDER — VITAMIN D3 125 MCG (5000 UT) PO CAPS: 1.0000 | ORAL_CAPSULE | Freq: Every day | ORAL | 5 refills | Status: DC

## 2020-07-18 NOTE — Progress Notes (Signed)
Safety precautions to be maintained throughout the outpatient stay will include: orient to surroundings, keep bed in low position, maintain call bell within reach at all times, provide assistance with transfer out of bed and ambulation.  

## 2020-07-18 NOTE — Patient Instructions (Addendum)
______________________________________________________________________________________________  Weight Management Required  URGENT: Your weight has been found to be adversely affecting your health.  Dear Jacqueline Delgado:  Your current Estimated body mass index is 51.37 kg/m as calculated from the following:   Height as of 06/09/20: _0  (1.6 m).   Weight as of 06/09/20: 290 lb (131.5 kg).  Please use the table below to identify your weight category and associated incidence of chronic pain, secondary to your weight.  Body Mass Index (BMI) Classification BMI level (kg/m2) Category Associated incidence of chronic pain  <18  Underweight   18.5-24.9 Ideal body weight   25-29.9 Overweight  20%  30-34.9 Obese (Class I)  68%  35-39.9 Severe obesity (Class II)  136%  >40 Extreme obesity (Class III)  254%   In addition: You will be considered "Morbidly Obese", if your BMI is above 30 and you have one or more of the following conditions which are known to be caused and/or directly associated with obesity: 1.    Type 2 Diabetes (Which in turn can lead to cardiovascular diseases (CVD), stroke, peripheral vascular diseases (PVD), retinopathy, nephropathy, and neuropathy) 2.    Cardiovascular Disease (High Blood Pressure; Congestive Heart Failure; High Cholesterol; Coronary Artery Disease; Angina; or History of Heart Attacks) 3.    Breathing problems (Asthma; obesity-hypoventilation syndrome; obstructive sleep apnea; chronic inflammatory airway disease; reactive airway disease; or shortness of breath) 4.    Chronic kidney disease 5.    Liver disease (nonalcoholic fatty liver disease) 6.    High blood pressure 7.    Acid reflux (gastroesophageal reflux disease; heartburn) 8.    Osteoarthritis (OA) (with any of the following: hip pain; knee pain; and/or low back pain) 9.    Low back pain (Lumbar Facet Syndrome; and/or Degenerative Disc Disease) 10.  Hip pain (Osteoarthritis of hip) (For every 1 lbs of  added body weight, there is a 2 lbs increase in pressure inside of each hip articulation. 1:2 mechanical relationship) 11.  Knee pain (Osteoarthritis of knee) (For every 1 lbs of added body weight, there is a 4 lbs increase in pressure inside of each knee articulation. 1:4 mechanical relationship) (patients with a BMI>30 kg/m2 were 6.8 times more likely to develop knee OA than normal-weight individuals) 12.  Cancer: Epidemiological studies have shown that obesity is a risk factor for: post-menopausal breast cancer; cancers of the endometrium, colon and kidney cancer; malignant adenomas of the oesophagus. Obese subjects have an approximately 1.5-3.5-fold increased risk of developing these cancers compared with normal-weight subjects, and it has been estimated that between 15 and 45% of these cancers can be attributed to overweight. More recent studies suggest that obesity may also increase the risk of other types of cancer, including pancreatic, hepatic and gallbladder cancer. (Ref: Obesity and cancer. Pischon T, Nthlings U, [employer] H. Proc Nutr Soc. 2008 May;67(2):128-45. doi: 93.2671/I4580998338250539.) The International Agency for Research on Cancer (IARC) has identified 13 cancers associated with overweight and obesity: meningioma, multiple myeloma, adenocarcinoma of the esophagus, and cancers of the thyroid, postmenopausal breast cancer, gallbladder, stomach, liver, pancreas, kidney, ovaries, uterus, colon and rectal (colorectal) cancers. 29 percent of all cancers diagnosed in women and 24 percent of those diagnosed in men are associated with overweight and obesity.  Recommendation: At this point it is urgent that you take a step back and concentrate in loosing weight. Dedicate 100% of your efforts on this task. Nothing else will improve your health more than bringing your weight down and your BMI to less  than 30. If you are here, you probably have chronic pain. Because most chronic pain patients have  difficulty exercising secondary to their pain, you must rely on proper nutrition and diet in order to lose the weight. If your BMI is above 40, you should seriously consider bariatric surgery. A realistic goal is to lose 10% of your body weight over a period of 12 months.  Be honest to yourself, if over time you have unsuccessfully tried to lose weight, then it is time for you to seek professional help and to enter a medically supervised weight management program, and/or undergo bariatric surgery. Stop procrastinating.   Pain management considerations:  1.    Pharmacological Problems: Be advised that the use of opioid analgesics (oxycodone; hydrocodone; morphine; methadone; codeine; and all of their derivatives) have been associated with decreased metabolism and weight gain.  For this reason, should we see that you are unable to lose weight while taking these medications, it may become necessary for [place] to taper down and indefinitely discontinue them.  2.    Technical Problems: The incidence of successful interventional therapies decreases as the patient's BMI increases. It is much more difficult to accomplish a safe and effective interventional therapy on a patient with a BMI above 35. 3.    Radiation Exposure Problems: The x-rays machine, used to accomplish injection therapies, will automatically increase their x-ray output in order to capture an appropriate bone image. This means that radiation exposure increases exponentially with the patient's BMI. (The higher the BMI, the higher the radiation exposure.) Although the level of radiation used at a given time is still safe to the patient, it is not for the physician and/or assisting staff. Unfortunately, radiation exposure is accumulative. Because physicians and the staff have to do procedures and be exposed on a daily basis, this can result in health problems such as cancer and radiation burns. Radiation exposure to the staff is monitored by the radiation  batches that they wear. The exposure levels are reported back to the staff on a quarterly basis. Depending on levels of exposure, physicians and staff may be obligated by law to decrease this exposure. This means that they have the right and obligation to refuse providing therapies where they may be overexposed to radiation. For this reason, physicians may decline to offer therapies such as radiofrequency ablation or implants to patients with a BMI above 40. 4.    Current Trends: Be advised that the current trend is to no longer offer certain therapies to patients with a BMI equal to, or above 35, due to increase perioperative risks, increased technical procedural difficulties, and excessive radiation exposure to healthcare personnel.  ______________________________________________________________________________________________    ____________________________________________________________________________________________  Preparing for Procedure with Sedation  Procedure appointments are limited to planned procedures:  No Prescription Refills.  No disability issues will be discussed.  No medication changes will be discussed.  Instructions:  Oral Intake: Do not eat or drink anything for at least 8 hours prior to your procedure. (Exception: Blood Pressure Medication. See below.)  Transportation: Unless otherwise stated by your physician, you may drive yourself after the procedure.  Blood Pressure Medicine: Do not forget to take your blood pressure medicine with a sip of water the morning of the procedure. If your Diastolic (lower reading)is above 100 mmHg, elective cases will be cancelled/rescheduled.  Blood thinners: These will need to be stopped for procedures. Notify our staff if you are taking any blood thinners. Depending on which one you take, there will  be specific instructions on how and when to stop it.  Diabetics on insulin: Notify the staff so that you can be scheduled 1st case  in the morning. If your diabetes requires high dose insulin, take only  of your normal insulin dose the morning of the procedure and notify the staff that you have done so.  Preventing infections: Shower with an antibacterial soap the morning of your procedure.  Build-up your immune system: Take 1000 mg of Vitamin C with every meal (3 times a day) the day prior to your procedure.  Antibiotics: Inform the staff if you have a condition or reason that requires you to take antibiotics before dental procedures.  Pregnancy: If you are pregnant, call and cancel the procedure.  Sickness: If you have a cold, fever, or any active infections, call and cancel the procedure.  Arrival: You must be in the facility at least 30 minutes prior to your scheduled procedure.  Children: Do not bring children with you.  Dress appropriately: Bring dark clothing that you would not mind if they get stained.  Valuables: Do not bring any jewelry or valuables.  Reasons to call and reschedule or cancel your procedure: (Following these recommendations will minimize the risk of a serious complication.)  Surgeries: Avoid having procedures within 2 weeks of any surgery. (Avoid for 2 weeks before or after any surgery).  Flu Shots: Avoid having procedures within 2 weeks of a flu shots or . (Avoid for 2 weeks before or after immunizations).  Barium: Avoid having a procedure within 7-10 days after having had a radiological study involving the use of radiological contrast. (Myelograms, Barium swallow or enema study).  Heart attacks: Avoid any elective procedures or surgeries for the initial 6 months after a "Myocardial Infarction" (Heart Attack).  Blood thinners: It is imperative that you stop these medications before procedures. Let us know if you if you take any blood thinner.   Infection: Avoid procedures during or within two weeks of an infection (including chest colds or gastrointestinal problems). Symptoms associated  with infections include: Localized redness, fever, chills, night sweats or profuse sweating, burning sensation when voiding, cough, congestion, stuffiness, runny nose, sore throat, diarrhea, nausea, vomiting, cold or Flu symptoms, recent or current infections. It is specially important if the infection is over the area that we intend to treat.  Heart and lung problems: Symptoms that may suggest an active cardiopulmonary problem include: cough, chest pain, breathing difficulties or shortness of breath, dizziness, ankle swelling, uncontrolled high or unusually low blood pressure, and/or palpitations. If you are experiencing any of these symptoms, cancel your procedure and contact your primary care physician for an evaluation.  Remember:  Regular Business hours are:  Monday to Thursday 8:00 AM to 4:00 PM  Provider's Schedule: Milinda Pointer, MD:  Procedure days: Tuesday and Thursday 7:30 AM to 4:00 PM  Gillis Santa, MD:  Procedure days: Monday and Wednesday 7:30 AM to 4:00 PM ____________________________________________________________________________________________   ____________________________________________________________________________________________  General Risks and Possible Complications  Patient Responsibilities: It is important that you read this as it is part of your informed consent. It is our duty to inform you of the risks and possible complications associated with treatments offered to you. It is your responsibility as a patient to read this and to ask questions about anything that is not clear or that you believe was not covered in this document.  Patients Rights: You have the right to refuse treatment. You also have the right to change your mind, even  after initially having agreed to have the treatment done. However, under this last option, if you wait until the last second to change your mind, you may be charged for the materials used up to that point.  Introduction:  Medicine is not an Chief Strategy Officer. Everything in Medicine, including the lack of treatment(s), carries the potential for danger, harm, or loss (which is by definition: Risk). In Medicine, a complication is a secondary problem, condition, or disease that can aggravate an already existing one. All treatments carry the risk of possible complications. The fact that a side effects or complications occurs, does not imply that the treatment was conducted incorrectly. It must be clearly understood that these can happen even when everything is done following the highest safety standards.  No treatment: You can choose not to proceed with the proposed treatment alternative. The PRO(s) would include: avoiding the risk of complications associated with the therapy. The CON(s) would include: not getting any of the treatment benefits. These benefits fall under one of three categories: diagnostic; therapeutic; and/or palliative. Diagnostic benefits include: getting information which can ultimately lead to improvement of the disease or symptom(s). Therapeutic benefits are those associated with the successful treatment of the disease. Finally, palliative benefits are those related to the decrease of the primary symptoms, without necessarily curing the condition (example: decreasing the pain from a flare-up of a chronic condition, such as incurable terminal cancer).  General Risks and Complications: These are associated to most interventional treatments. They can occur alone, or in combination. They fall under one of the following six (6) categories: no benefit or worsening of symptoms; bleeding; infection; nerve damage; allergic reactions; and/or death. 1. No benefits or worsening of symptoms: In Medicine there are no guarantees, only probabilities. No healthcare provider can ever guarantee that a medical treatment will work, they can only state the probability that it may. Furthermore, there is always the possibility that the  condition may worsen, either directly, or indirectly, as a consequence of the treatment. 2. Bleeding: This is more common if the patient is taking a blood thinner, either prescription or over the counter (example: Goody Powders, Fish oil, Aspirin, Garlic, etc.), or if suffering a condition associated with impaired coagulation (example: Hemophilia, cirrhosis of the liver, low platelet counts, etc.). However, even if you do not have one on these, it can still happen. If you have any of these conditions, or take one of these drugs, make sure to notify your treating physician. 3. Infection: This is more common in patients with a compromised immune system, either due to disease (example: diabetes, cancer, human immunodeficiency virus [HIV], etc.), or due to medications or treatments (example: therapies used to treat cancer and rheumatological diseases). However, even if you do not have one on these, it can still happen. If you have any of these conditions, or take one of these drugs, make sure to notify your treating physician. 4. Nerve Damage: This is more common when the treatment is an invasive one, but it can also happen with the use of medications, such as those used in the treatment of cancer. The damage can occur to small secondary nerves, or to large primary ones, such as those in the spinal cord and brain. This damage may be temporary or permanent and it may lead to impairments that can range from temporary numbness to permanent paralysis and/or brain death. 5. Allergic Reactions: Any time a substance or material comes in contact with our body, there is the possibility of an allergic  reaction. These can range from a mild skin rash (contact dermatitis) to a severe systemic reaction (anaphylactic reaction), which can result in death. 6. Death: In general, any medical intervention can result in death, most of the time due to an unforeseen  complication. ____________________________________________________________________________________________  ____________________________________________________________________________________________  General Risks and Possible Complications  Patient Responsibilities: It is important that you read this as it is part of your informed consent. It is our duty to inform you of the risks and possible complications associated with treatments offered to you. It is your responsibility as a patient to read this and to ask questions about anything that is not clear or that you believe was not covered in this document.  Patients Rights: You have the right to refuse treatment. You also have the right to change your mind, even after initially having agreed to have the treatment done. However, under this last option, if you wait until the last second to change your mind, you may be charged for the materials used up to that point.  Introduction: Medicine is not an Chief Strategy Officer. Everything in Medicine, including the lack of treatment(s), carries the potential for danger, harm, or loss (which is by definition: Risk). In Medicine, a complication is a secondary problem, condition, or disease that can aggravate an already existing one. All treatments carry the risk of possible complications. The fact that a side effects or complications occurs, does not imply that the treatment was conducted incorrectly. It must be clearly understood that these can happen even when everything is done following the highest safety standards.  No treatment: You can choose not to proceed with the proposed treatment alternative. The PRO(s) would include: avoiding the risk of complications associated with the therapy. The CON(s) would include: not getting any of the treatment benefits. These benefits fall under one of three categories: diagnostic; therapeutic; and/or palliative. Diagnostic benefits include: getting information which can  ultimately lead to improvement of the disease or symptom(s). Therapeutic benefits are those associated with the successful treatment of the disease. Finally, palliative benefits are those related to the decrease of the primary symptoms, without necessarily curing the condition (example: decreasing the pain from a flare-up of a chronic condition, such as incurable terminal cancer).  General Risks and Complications: These are associated to most interventional treatments. They can occur alone, or in combination. They fall under one of the following six (6) categories: no benefit or worsening of symptoms; bleeding; infection; nerve damage; allergic reactions; and/or death. 7. No benefits or worsening of symptoms: In Medicine there are no guarantees, only probabilities. No healthcare provider can ever guarantee that a medical treatment will work, they can only state the probability that it may. Furthermore, there is always the possibility that the condition may worsen, either directly, or indirectly, as a consequence of the treatment. 8. Bleeding: This is more common if the patient is taking a blood thinner, either prescription or over the counter (example: Goody Powders, Fish oil, Aspirin, Garlic, etc.), or if suffering a condition associated with impaired coagulation (example: Hemophilia, cirrhosis of the liver, low platelet counts, etc.). However, even if you do not have one on these, it can still happen. If you have any of these conditions, or take one of these drugs, make sure to notify your treating physician. 9. Infection: This is more common in patients with a compromised immune system, either due to disease (example: diabetes, cancer, human immunodeficiency virus [HIV], etc.), or due to medications or treatments (example: therapies used to treat cancer  and rheumatological diseases). However, even if you do not have one on these, it can still happen. If you have any of these conditions, or take one of these  drugs, make sure to notify your treating physician. 10. Nerve Damage: This is more common when the treatment is an invasive one, but it can also happen with the use of medications, such as those used in the treatment of cancer. The damage can occur to small secondary nerves, or to large primary ones, such as those in the spinal cord and brain. This damage may be temporary or permanent and it may lead to impairments that can range from temporary numbness to permanent paralysis and/or brain death. 11. Allergic Reactions: Any time a substance or material comes in contact with our body, there is the possibility of an allergic reaction. These can range from a mild skin rash (contact dermatitis) to a severe systemic reaction (anaphylactic reaction), which can result in death. 12. Death: In general, any medical intervention can result in death, most of the time due to an unforeseen complication. ____________________________________________________________________________________________  Epidural Steroid Injection Patient Information  Description: The epidural space surrounds the nerves as they exit the spinal cord.  In some patients, the nerves can be compressed and inflamed by a bulging disc or a tight spinal canal (spinal stenosis).  By injecting steroids into the epidural space, we can bring irritated nerves into direct contact with a potentially helpful medication.  These steroids act directly on the irritated nerves and can reduce swelling and inflammation which often leads to decreased pain.  Epidural steroids may be injected anywhere along the spine and from the neck to the low back depending upon the location of your pain.   After numbing the skin with local anesthetic (like Novocaine), a small needle is passed into the epidural space slowly.  You may experience a sensation of pressure while this is being done.  The entire block usually last less than 10 minutes.  Conditions which may be treated by  epidural steroids:  Low back and leg pain Neck and arm pain Spinal stenosis Post-laminectomy syndrome Herpes zoster (shingles) pain Pain from compression fractures  Preparation for the injection:  Do not eat any solid food or dairy products within 8 hours of your appointment.  You may drink clear liquids up to 3 hours before appointment.  Clear liquids include water, black coffee, juice or soda.  No milk or cream please. You may take your regular medication, including pain medications, with a sip of water before your appointment  Diabetics should hold regular insulin (if taken separately) and take 1/2 normal NPH dos the morning of the procedure.  Carry some sugar containing items with you to your appointment. A driver must accompany you and be prepared to drive you home after your procedure.  Bring all your current medications with your. An IV may be inserted and sedation may be given at the discretion of the physician.   A blood pressure cuff, EKG and other monitors will often be applied during the procedure.  Some patients may need to have extra oxygen administered for a short period. You will be asked to provide medical information, including your allergies, prior to the procedure.  We must know immediately if you are taking blood thinners (like Coumadin/Warfarin)  Or if you are allergic to IV iodine contrast (dye). We must know if you could possible be pregnant.  Possible side-effects: Bleeding from needle site Infection (rare, may require surgery) Nerve injury (rare) Numbness &  tingling (temporary) Difficulty urinating (rare, temporary) Spinal headache ( a headache worse with upright posture) Light -headedness (temporary) Pain at injection site (several days) Decreased blood pressure (temporary) Weakness in arm/leg (temporary) Pressure sensation in back/neck (temporary)  Call if you experience: Fever/chills associated with headache or increased back/neck pain. Headache  worsened by an upright position. New onset weakness or numbness of an extremity below the injection site Hives or difficulty breathing (go to the emergency room) Inflammation or drainage at the infection site Severe back/neck pain Any new symptoms which are concerning to you  Please note:  Although the local anesthetic injected can often make your back or neck feel good for several hours after the injection, the pain will likely return.  It takes 3-7 days for steroids to work in the epidural space.  You may not notice any pain relief for at least that one week.  If effective, we will often do a series of three injections spaced 3-6 weeks apart to maximally decrease your pain.  After the initial series, we generally will wait several months before considering a repeat injection of the same type.  If you have any questions, please call [phone] [facility]

## 2020-07-19 ENCOUNTER — Telehealth: Payer: Self-pay | Admitting: Family

## 2020-07-19 DIAGNOSIS — I10 Essential (primary) hypertension: Secondary | ICD-10-CM

## 2020-07-19 LAB — ANA COMPREHENSIVE PANEL
Anti JO-1: <0.2 AI (ref 0.0–0.9)
Centromere Ab Screen: 1.8 AI — ABNORMAL HIGH (ref 0.0–0.9)
Chromatin Ab SerPl-aCnc: <0.2 AI (ref 0.0–0.9)
ENA RNP Ab: 0.2 AI (ref 0.0–0.9)
ENA SM Ab Ser-aCnc: <0.2 AI (ref 0.0–0.9)
ENA SSA (RO) Ab: <0.2 AI (ref 0.0–0.9)
ENA SSB (LA) Ab: <0.2 AI (ref 0.0–0.9)
Scleroderma (Scl-70) (ENA) Antibody, IgG: <0.2 AI (ref 0.0–0.9)
dsDNA Ab: <1 IU/mL (ref 0–9)

## 2020-07-19 LAB — URIC ACID: Uric Acid: 6.5 mg/dL (ref 3.0–7.2)

## 2020-07-19 LAB — URIC ACID, RANDOM URINE: Uric Acid, Ur: 33.5 mg/dL

## 2020-07-19 LAB — RHEUMATOID FACTOR: Rheumatoid fact SerPl-aCnc: <10 IU/mL (ref 0.0–13.9)

## 2020-07-19 NOTE — Telephone Encounter (Signed)
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 I wasn't sure if you would know what was going on with this.

## 2020-07-19 NOTE — Telephone Encounter (Signed)
Patient had labs done at [facility]. She stated that the doctor there asked her to call her provider and review labs.

## 2020-07-19 NOTE — Telephone Encounter (Signed)
It appears that Dr. Dossie Arbour at [facility] drew these labs for patient, but wanted you to discuss with patient. Looks like there was some abnormalities on her ANA comprehensive panel that was drawn. Do you want me to schedule patient one day in a same day slot?

## 2020-07-21 MED ORDER — POTASSIUM CHLORIDE ER 10 MEQ PO TBCR: 10.0000 meq | EXTENDED_RELEASE_TABLET | ORAL | 1 refills | Status: DC

## 2020-07-21 NOTE — Telephone Encounter (Signed)
Call pt BMP ordered, please sch for Monday She may resume potassium chloride 82meq QOD Low potassium likely due to HCTZ 25mg  dose I have sent in . Referral to rheum placed Let us know if you dont hear back within a week in regards to an appointment being scheduled.

## 2020-07-21 NOTE — Telephone Encounter (Signed)
Please use follow up slot; labs are not urgent Advise that I do not routinely draw the lab panel that he drew; what I can see is her autoimmune panel was abnormal.  I would advise ahead of follow up to allow me to refer her to rheumatology as well

## 2020-07-21 NOTE — Telephone Encounter (Signed)
I spoke with patient & she was fine with going ahead with rheumatology referral. She is scheduled for a VV with 11/8. She asked if you would please send her in potassium since hers was so low when it was checked.

## 2020-07-21 NOTE — Telephone Encounter (Signed)
Patient will pick up potassium & is scheduled for BMP Monday. Pt will let us know if she does hear in regards to referral.

## 2020-07-24 ENCOUNTER — Other Ambulatory Visit: Payer: Medicare HMO

## 2020-07-24 DIAGNOSIS — R69 Illness, unspecified: Secondary | ICD-10-CM | POA: Diagnosis not present

## 2020-07-24 DIAGNOSIS — M8949 Other hypertrophic osteoarthropathy, multiple sites: Secondary | ICD-10-CM | POA: Diagnosis not present

## 2020-07-24 DIAGNOSIS — M5136 Other intervertebral disc degeneration, lumbar region: Secondary | ICD-10-CM | POA: Diagnosis not present

## 2020-07-24 DIAGNOSIS — R600 Localized edema: Secondary | ICD-10-CM | POA: Diagnosis not present

## 2020-07-24 DIAGNOSIS — R768 Other specified abnormal immunological findings in serum: Secondary | ICD-10-CM | POA: Diagnosis not present

## 2020-07-24 DIAGNOSIS — I776 Arteritis, unspecified: Secondary | ICD-10-CM | POA: Diagnosis not present

## 2020-07-26 NOTE — Progress Notes (Signed)
PROVIDER NOTE: Information contained herein reflects review and annotations entered in association with encounter. Interpretation of such information and data should be left to medically-trained personnel. Information provided to patient can be located elsewhere in the medical record under "Patient Instructions". Document created using STT-dictation technology, any transcriptional errors that may result from process are unintentional.    Patient: Jacqueline Delgado  Service Category: Procedure  Provider: Gaspar Cola, MD  DOB: [birth year]  DOS: 07/27/2020  Location: [facility]  MRN: [id number]  Setting: Ambulatory - outpatient  Referring Provider: Milinda Pointer, MD  Type: Established Patient  Specialty: Interventional Pain Management  PCP: Burnard Hawthorne, FNP   Primary Reason for Visit: Interventional Pain Management Treatment. CC: Back Pain (lower)  Procedure:          Anesthesia, Analgesia, Anxiolysis:  Type: Therapeutic Inter-Laminar Epidural Steroid Injection  #1  Region: Lumbar Level: L4-5 Level. Laterality: Right-Sided Paramedial  Type: Moderate (Conscious) Sedation combined with Local Anesthesia Indication(s): Analgesia and Anxiety Route: Intravenous (IV) IV Access: Secured Sedation: Meaningful verbal contact was maintained at all times during the procedure  Local Anesthetic: Lidocaine 1-2%  Position: Prone with head of the table was raised to facilitate breathing.   Indications: 1. DDD (degenerative disc disease), lumbosacral   2. Lumbar Grade 1 Anterolisthesis of L4/L5   3. Lumbosacral central spinal stenosis (9 mm) (L4-5 and L5-S1)   4. Lumbosacral radiculopathy at L5 (Right)   5. Chronic low back pain (1ry area of Pain) (Bilateral) (R>L) w/ sciatica (Right)   6. Chronic lower extremity pain (2ry area of Pain) (Bilateral) (R>L)    Pain Score: Pre-procedure: 4 /10 Post-procedure: 0-No pain/10   Pre-op Assessment:  Jacqueline Delgado is a 69 y.o. (year  old), female patient, seen today for interventional treatment. She  has a past surgical history that includes Joint replacement (Right); Joint replacement; Cholecystectomy; Abdominal hysterectomy; Shoulder arthroscopy with rotator cuff repair and subacromial decompression (Right, 06/14/2015); Esophagogastroduodenoscopy (egd) with propofol (N/A, 10/11/2015); Savory dilation (N/A, 10/11/2015); Colonoscopy w/ polypectomy; Esophagogastroduodenoscopy (egd) with propofol (N/A, 12/10/2017); and Colonoscopy with propofol (N/A, 12/10/2017). Jacqueline Delgado has a current medication list which includes the following prescription(s): bupropion, vitamin d3, ergocalciferol, famotidine, losartan, meloxicam, oxybutynin, potassium chloride, rabeprazole, tramadol, hydrochlorothiazide, and tramadol, and the following Facility-Administered Medications: fentanyl and midazolam. Her primarily concern today is the Back Pain (lower)  Initial Vital Signs:  Pulse/HCG Rate: 92ECG Heart Rate: 62 Temp: (!) 97.3 F (36.3 C) Resp: 18 BP: 125/89 SpO2: 98 %  BMI: Estimated body mass index is 50.29 kg/m as calculated from the following:   Height as of this encounter: 5\' 4"  (1.626 m).   Weight as of this encounter: 293 lb (132.9 kg).  Risk Assessment: Allergies: Reviewed. She has No Known Allergies.  Allergy Precautions: None required Coagulopathies: Reviewed. None identified.  Blood-thinner therapy: None at this time Active Infection(s): Reviewed. None identified. Jacqueline Delgado is afebrile  Site Confirmation: Jacqueline Delgado was asked to confirm the procedure and laterality before marking the site Procedure checklist: Completed Consent: Before the procedure and under the influence of no sedative(s), amnesic(s), or anxiolytics, the patient was informed of the treatment options, risks and possible complications. To fulfill our ethical and legal obligations, as recommended by the American Medical Association's Code of Ethics, I have informed the  patient of my clinical impression; the nature and purpose of the treatment or procedure; the risks, benefits, and possible complications of the intervention; the alternatives, including doing nothing; the risk(s) and benefit(s)  of the alternative treatment(s) or procedure(s); and the risk(s) and benefit(s) of doing nothing. The patient was provided information about the general risks and possible complications associated with the procedure. These may include, but are not limited to: failure to achieve desired goals, infection, bleeding, organ or nerve damage, allergic reactions, paralysis, and death. In addition, the patient was informed of those risks and complications associated to Spine-related procedures, such as failure to decrease pain; infection (i.e.: Meningitis, epidural or intraspinal abscess); bleeding (i.e.: epidural hematoma, subarachnoid hemorrhage, or any other type of intraspinal or peri-dural bleeding); organ or nerve damage (i.e.: Any type of peripheral nerve, nerve root, or spinal cord injury) with subsequent damage to sensory, motor, and/or autonomic systems, resulting in permanent pain, numbness, and/or weakness of one or several areas of the body; allergic reactions; (i.e.: anaphylactic reaction); and/or death. Furthermore, the patient was informed of those risks and complications associated with the medications. These include, but are not limited to: allergic reactions (i.e.: anaphylactic or anaphylactoid reaction(s)); adrenal axis suppression; blood sugar elevation that in diabetics may result in ketoacidosis or comma; water retention that in patients with history of congestive heart failure may result in shortness of breath, pulmonary edema, and decompensation with resultant heart failure; weight gain; swelling or edema; medication-induced neural toxicity; particulate matter embolism and blood vessel occlusion with resultant organ, and/or nervous system infarction; and/or aseptic necrosis  of one or more joints. Finally, the patient was informed that Medicine is not an exact science; therefore, there is also the possibility of unforeseen or unpredictable risks and/or possible complications that may result in a catastrophic outcome. The patient indicated having understood very clearly. We have given the patient no guarantees and we have made no promises. Enough time was given to the patient to ask questions, all of which were answered to the patient's satisfaction. Ms. Worsley has indicated that she wanted to continue with the procedure. Attestation: I, the ordering provider, attest that I have discussed with the patient the benefits, risks, side-effects, alternatives, likelihood of achieving goals, and potential problems during recovery for the procedure that I have provided informed consent. Date  Time: 07/27/2020  9:35 AM  Pre-Procedure Preparation:  Monitoring: As per clinic protocol. Respiration, ETCO2, SpO2, BP, heart rate and rhythm monitor placed and checked for adequate function Safety Precautions: Patient was assessed for positional comfort and pressure points before starting the procedure. Time-out: I initiated and conducted the "Time-out" before starting the procedure, as per protocol. The patient was asked to participate by confirming the accuracy of the "Time Out" information. Verification of the correct person, site, and procedure were performed and confirmed by me, the nursing staff, and the patient. "Time-out" conducted as per Joint Commission's Universal Protocol (UP.01.01.01). Time: 1035  Description of Procedure:          Target Area: The interlaminar space, initially targeting the lower laminar border of the superior vertebral body. Approach: Paramedial approach. Area Prepped: Entire Posterior Lumbar Region DuraPrep (Iodine Povacrylex [0.7% available iodine] and Isopropyl Alcohol, 74% w/w) Safety Precautions: Aspiration looking for blood return was conducted prior  to all injections. At no point did we inject any substances, as a needle was being advanced. No attempts were made at seeking any paresthesias. Safe injection practices and needle disposal techniques used. Medications properly checked for expiration dates. SDV (single dose vial) medications used. Description of the Procedure: Protocol guidelines were followed. The procedure needle was introduced through the skin, ipsilateral to the reported pain, and advanced to the target  area. Bone was contacted and the needle walked caudad, until the lamina was cleared. The epidural space was identified using "loss-of-resistance technique" with 2-3 ml of PF-NaCl (0.9% NSS), in a 5cc LOR glass syringe.  However, once this loss of resistance was obtained.  I noticed that I had some dark heme come back.  I flushed this with some saline until it cleared I make sure that there was no continuous flow and there was also no bright red heme coming out.  Contrast was injected to confirm placement Images were saved into the PACS system.  Just to be on the safe side, I did remove the local anesthetic from the solution injected into the epidural space.  After the procedure I did talk to the patient about having noticed this him come out and I suggested doing a lumbar MRI since my experience has shown me that increased epidural pressure sometimes will be accompanied by engorgement of the epidural venous plexus.  The biggest problem that we will have in this case is the patient's size.  She has a BMI of 50.29 kg/m and her weight is close to 300 pounds.  The distance from the skin to the epidural space at the L4-5 level was found to be approximately 12 cm, which is a considerable amount of padding in the posterior aspect of the body.  Vitals:   07/27/20 1050 07/27/20 1100 07/27/20 1110 07/27/20 1120  BP: (!) 141/88 137/78 131/72 (!) 147/80  Pulse: 66     Resp: 14 (!) 24 20 (!) 23  Temp:  97.6 F (36.4 C)  97.7 F (36.5 C)  TempSrc:   Temporal  Temporal  SpO2: 99% 96% 98% 100%  Weight:      Height:        Start Time: 1035 hrs. End Time: 1047 hrs.  Materials:  Needle(s) Type: Epidural needle Gauge: 17G Length: 20cm (the needle had to be inserted 12 cm to reach the posterior epidural space) Medication(s): Once in the epidural space, I injected some contrast and lateral views demonstrated the contrast to be in the posterior epidural space while the patient was in the prone position.  No contrast was observed to have deposited into the anterior area of the canal.  However, just to be on the safe side and since I was unable to determine if there was any CSF within some of the heme that came out from the epidural space, I removed the ropivacaine from the solution and the only thing that I injected into the epidural space was the triamcinolone 40 mg/mL (1 mL) with 2 mL of preservative-free saline solution.  Imaging Guidance (Spinal):          Type of Imaging Technique: Fluoroscopy Guidance (Spinal) Indication(s): Assistance in needle guidance and placement for procedures requiring needle placement in or near specific anatomical locations not easily accessible without such assistance. Exposure Time: Please see nurses notes. Contrast: Before injecting any contrast, we confirmed that the patient did not have an allergy to iodine, shellfish, or radiological contrast. Once satisfactory needle placement was completed at the desired level, radiological contrast was injected. Contrast injected under live fluoroscopy. No contrast complications. See chart for type and volume of contrast used.  Some of the contrast can be seen in the muscle area as I injected some contrast in an attempt to guide my placement but I found myself to be too shallow. Fluoroscopic Guidance: I was personally present during the use of fluoroscopy. "Tunnel Vision Technique" used to obtain  the best possible view of the target area. Parallax error corrected before  commencing the procedure. "Direction-depth-direction" technique used to introduce the needle under continuous pulsed fluoroscopy. Once target was reached, antero-posterior, oblique, and lateral fluoroscopic projection used confirm needle placement in all planes. Images permanently stored in EMR. Interpretation: I personally interpreted the imaging intraoperatively. Adequate needle placement confirmed in multiple planes. Appropriate spread of contrast into desired area was observed. No evidence of afferent or efferent intravascular uptake. No intrathecal or subarachnoid spread observed. Permanent images saved into the patient's record.  Antibiotic Prophylaxis:   Anti-infectives (From admission, onward)   None     Indication(s): None identified  Post-operative Assessment:  Post-procedure Vital Signs:  Pulse/HCG Rate: 66(!) 57 Temp: 97.7 F (36.5 C) Resp: (!) 23 BP: (!) 147/80 SpO2: 100 %  EBL: None  Complications: While placing the needle into the epidural space, I did draw some dark blood back suggestive of an epidural vein.  I did wait until it stopped.  I did not identify any CSF.  However, after the procedure when the patient arrived to the recovery area, she was complaining of some headaches.   Note: The patient tolerated the entire procedure well. A repeat set of vitals were taken after the procedure and the patient was kept under observation following institutional policy, for this type of procedure. Post-procedural neurological assessment was performed, showing return to baseline, prior to discharge. The patient was provided with post-procedure discharge instructions, including a section on how to identify potential problems. Should any problems arise concerning this procedure, the patient was given instructions to immediately contact us, at any time, without hesitation. In any case, we plan to contact the patient by telephone for a follow-up status report regarding this interventional  procedure.  Comments:  No additional relevant information.  Plan of Care  Orders:  Orders Placed This Encounter  Procedures  . Lumbar Epidural Injection    Scheduling Instructions:     Procedure: Interlaminar LESI L4-5     Laterality: Right     Sedation: Patient's choice     Timeframe:  Today    Order Specific Question:   Where will this procedure be performed?    Answer:   [facility]  . DG PAIN [facility] C-ARM 1-60 MIN NO REPORT    Intraoperative interpretation by procedural physician at [place].    Standing Status:   Standing    Number of Occurrences:   1    Order Specific Question:   Reason for exam:    Answer:   Assistance in needle guidance and placement for procedures requiring needle placement in or near specific anatomical locations not easily accessible without such assistance.  . MR LUMBAR SPINE WO CONTRAST    Patient presents with axial pain with possible radicular component.  In addition to any acute findings, please report on:  1. Facet (Zygapophyseal) joint DJD (Hypertrophy, space narrowing, subchondral sclerosis, and/or osteophyte formation) 2. DDD and/or IVDD (Loss of disc height, desiccation or "Black disc disease") 3. Pars defects 4. Spondylolisthesis, spondylosis, and/or spondyloarthropathies (include Degree/Grade of displacement in mm) 5. Vertebral body Fractures, including age (old, new/acute) 39. Modic Type Changes 7. Demineralization 8. Bone pathology 9. Central, Lateral Recess, and/or Foraminal Stenosis (include AP diameter of stenosis in mm) 10. Surgical changes (hardware type, status, and presence of fibrosis)  NOTE: Please specify level(s) and laterality.    Standing Status:   Future    Standing Expiration Date:   08/27/2020  Scheduling Instructions:     Imaging must be done as soon as possible. Inform patient that order will expire within 30 days and I will not renew it.    Order Specific Question:   What is the patient's  sedation requirement?    Answer:   No Sedation    Order Specific Question:   Does the patient have a pacemaker or implanted devices?    Answer:   No    Order Specific Question:   Preferred imaging location?    Answer:   [facility] (table limit-350lbs)    Order Specific Question:   Call Results- Best Contact Number?    Answer:   [phone] ([facility])    Order Specific Question:   Radiology Contrast Protocol - do NOT remove file path    Answer:   [email or web address]  . Informed Consent Details: Physician/Practitioner Attestation; Transcribe to consent form and obtain patient signature    Provider Attestation: I, Avalon Dossie Arbour, MD, (Pain Management Specialist), the physician/practitioner, attest that I have discussed with the patient the benefits, risks, side effects, alternatives, likelihood of achieving goals and potential problems during recovery for the procedure that I have provided informed consent.    Scheduling Instructions:     Note: Always confirm laterality of pain with Ms. Tolsma, before procedure.     Transcribe to consent form and obtain patient signature.    Order Specific Question:   Physician/Practitioner attestation of informed consent for procedure/surgical case    Answer:   I, the physician/practitioner, attest that I have discussed with the patient the benefits, risks, side effects, alternatives, likelihood of achieving goals and potential problems during recovery for the procedure that I have provided informed consent.    Order Specific Question:   Procedure    Answer:   Lumbar epidural steroid injection under fluoroscopic guidance    Order Specific Question:   Physician/Practitioner performing the procedure    Answer:   Marcey Persad A. Dossie Arbour, MD    Order Specific Question:   Indication/Reason    Answer:   Low back and/or lower extremity pain secondary to lumbar radiculitis  . Provide equipment / supplies at bedside     "Epidural Tray" (Disposable  single use) Catheter: NOT required    Standing Status:   Standing    Number of Occurrences:   1    Order Specific Question:   Specify    Answer:   Epidural Tray   Chronic Opioid Analgesic:  Hydrocodone/homatropine solution #120 mL, 5 mL 4 times daily (20 MME/day) MME/day: 20 mg/day   Medications ordered for procedure: Meds ordered this encounter  Medications  . iohexol (OMNIPAQUE) 180 MG/ML injection 10 mL    Must be Myelogram-compatible. If not available, you may substitute with a water-soluble, non-ionic, hypoallergenic, myelogram-compatible radiological contrast medium.  Marland Kitchen lidocaine (XYLOCAINE) 2 % (with pres) injection 400 mg  . lactated ringers infusion 1,000 mL  . midazolam (VERSED) 5 MG/5ML injection 1-2 mg    Make sure Flumazenil is available in the pyxis when using this medication. If oversedation occurs, administer 0.2 mg IV over 15 sec. If after 45 sec no response, administer 0.2 mg again over 1 min; may repeat at 1 min intervals; not to exceed 4 doses (1 mg)  . fentaNYL (SUBLIMAZE) injection 25-50 mcg    Make sure Narcan is available in the pyxis when using this medication. In the event of respiratory depression (RR< 8/min): Titrate NARCAN (naloxone) in increments of 0.1 to 0.2 mg IV  at 2-3 minute intervals, until desired degree of reversal.  . sodium chloride flush (NS) 0.9 % injection 2 mL  . ropivacaine (PF) 2 mg/mL (0.2%) (NAROPIN) injection 2 mL  . triamcinolone acetonide (KENALOG-40) injection 40 mg   Medications administered: We administered iohexol, lidocaine, lactated ringers, midazolam, fentaNYL, sodium chloride flush, ropivacaine (PF) 2 mg/mL (0.2%), and triamcinolone acetonide.  See the medical record for exact dosing, route, and time of administration.  Follow-up plan:   Return in about 2 weeks (around 08/10/2020) for (F2F), (PP) Follow-up.       Interventional management options: Planned, scheduled, and/or pending:    It is  clear that the patient has low back pain is probably secondary to osteoarthritis of the lumbar spine secondary to her class III morbid obesity causing facet arthropathy and subsequent low back pain.  However, she does have a component of a right sided L5 radiculopathy likely to be coming from a right L4-5 subarticular/lateral recess stenosis versus a far lateral disc herniation on the right side at the L5-S1 level and/or a right L5-S1 neural foraminal stenosis.  This osteoarthritis is also affecting both of her knees were she has already had to have a right knee replacement.  She is also very likely to be experiencing hip arthralgias, for the same reason.  Her morbid obesity is having some clear comorbidities including hypoventilation, obstructive sleep apnea, shortness of breath on exertion, hypertension, gastroesophageal reflux disease, etc.  This patient needs a medically supervised weight loss program and if no improvement is observed, then she should seriously consider other options such as bariatric surgery.  Her BMI goal  would be 30 or less.   Considering:   Diagnostic midline L4-5 LESI #1  Diagnostic bilateral lumbar facet block #1    PRN Procedures:   None at this time     Recent Visits Date Type Provider Dept  07/18/20 Office Visit Milinda Pointer, MD [facility]  05/22/20 Office Visit Milinda Pointer, MD [facility]  Showing recent visits within past 90 days and meeting all other requirements Today's Visits Date Type Provider Dept  07/27/20 Procedure visit Milinda Pointer, MD [facility]  Showing today's visits and meeting all other requirements Future Appointments Date Type Provider Dept  08/23/20 Appointment Milinda Pointer, MD [facility]  Showing future appointments within next 90 days and meeting all other requirements  Disposition: Discharge home  Discharge (Date  Time): 07/27/2020; 1133 hrs.   Primary Care Physician:  Burnard Hawthorne, FNP Location: [facility] Note by: Gaspar Cola, MD Date: 07/27/2020; Time: 3:04 PM  Disclaimer:  Medicine is not an Chief Strategy Officer. The only guarantee in medicine is that nothing is guaranteed. It is important to note that the decision to proceed with this intervention was based on the information collected from the patient. The Data and conclusions were drawn from the patient's questionnaire, the interview, and the physical examination. Because the information was provided in large part by the patient, it cannot be guaranteed that it has not been purposely or unconsciously manipulated. Every effort has been made to obtain as much relevant data as possible for this evaluation. It is important to note that the conclusions that lead to this procedure are derived in large part from the available data. Always take into account that the treatment will also be dependent on availability of resources and existing treatment guidelines, considered by other Pain Management Practitioners as being common knowledge and practice, at the time  of the intervention. For Medico-Legal purposes, it is also important to point out that variation in procedural techniques and pharmacological choices are the acceptable norm. The indications, contraindications, technique, and results of the above procedure should only be interpreted and judged by a Board-Certified Interventional Pain Specialist with extensive familiarity and expertise in the same exact procedure and technique.

## 2020-07-27 ENCOUNTER — Ambulatory Visit (HOSPITAL_BASED_OUTPATIENT_CLINIC_OR_DEPARTMENT_OTHER): Payer: Medicare HMO | Admitting: Pain Medicine

## 2020-07-27 ENCOUNTER — Other Ambulatory Visit: Payer: Self-pay

## 2020-07-27 ENCOUNTER — Ambulatory Visit
Admission: RE | Admit: 2020-07-27 | Discharge: 2020-07-27 | Disposition: A | Discharge status: Home / Self Care | Payer: Medicare HMO | Source: Ambulatory Visit | Attending: Pain Medicine | Admitting: Pain Medicine

## 2020-07-27 ENCOUNTER — Telehealth: Payer: Self-pay

## 2020-07-27 ENCOUNTER — Telehealth: Payer: Self-pay | Admitting: *Deleted

## 2020-07-27 ENCOUNTER — Encounter: Payer: Self-pay | Admitting: Pain Medicine

## 2020-07-27 VITALS — BP 147/80 | HR 66 | Temp 97.7°F | Resp 23 | Ht 64.0 in | Wt 293.0 lb

## 2020-07-27 DIAGNOSIS — M5417 Radiculopathy, lumbosacral region: Secondary | ICD-10-CM | POA: Diagnosis present

## 2020-07-27 DIAGNOSIS — G8929 Other chronic pain: Secondary | ICD-10-CM | POA: Insufficient documentation

## 2020-07-27 DIAGNOSIS — M5137 Other intervertebral disc degeneration, lumbosacral region: Secondary | ICD-10-CM

## 2020-07-27 DIAGNOSIS — M4310 Spondylolisthesis, site unspecified: Secondary | ICD-10-CM | POA: Diagnosis present

## 2020-07-27 DIAGNOSIS — M5136 Other intervertebral disc degeneration, lumbar region: Secondary | ICD-10-CM | POA: Insufficient documentation

## 2020-07-27 DIAGNOSIS — M79605 Pain in left leg: Secondary | ICD-10-CM

## 2020-07-27 DIAGNOSIS — M4807 Spinal stenosis, lumbosacral region: Secondary | ICD-10-CM | POA: Diagnosis present

## 2020-07-27 DIAGNOSIS — M79604 Pain in right leg: Secondary | ICD-10-CM | POA: Diagnosis present

## 2020-07-27 DIAGNOSIS — M5441 Lumbago with sciatica, right side: Secondary | ICD-10-CM | POA: Diagnosis present

## 2020-07-27 MED ORDER — LIDOCAINE HCL 2 % IJ SOLN
20.0000 mL | Freq: Once | INTRAMUSCULAR | Status: AC
  Administered 2020-07-27: 200 mg
  Filled 2020-07-27: qty 10

## 2020-07-27 MED ORDER — SODIUM CHLORIDE (PF) 0.9 % IJ SOLN
INTRAMUSCULAR | Status: AC
  Filled 2020-07-27: qty 10

## 2020-07-27 MED ORDER — IOHEXOL 180 MG/ML  SOLN
10.0000 mL | Freq: Once | INTRAMUSCULAR | Status: AC
  Administered 2020-07-27: 10 mL via EPIDURAL
  Filled 2020-07-27: qty 20

## 2020-07-27 MED ORDER — MIDAZOLAM HCL 5 MG/5ML IJ SOLN
1.0000 mg | INTRAMUSCULAR | Status: DC | PRN
  Administered 2020-07-27: 2 mg via INTRAVENOUS
  Administered 2020-07-27: 1 mg via INTRAVENOUS
  Filled 2020-07-27: qty 5

## 2020-07-27 MED ORDER — FENTANYL CITRATE (PF) 100 MCG/2ML IJ SOLN
25.0000 ug | INTRAMUSCULAR | Status: DC | PRN
  Administered 2020-07-27: 50 ug via INTRAVENOUS
  Filled 2020-07-27: qty 2

## 2020-07-27 MED ORDER — LACTATED RINGERS IV SOLN
1000.0000 mL | Freq: Once | INTRAVENOUS | Status: AC
  Administered 2020-07-27: 1000 mL via INTRAVENOUS

## 2020-07-27 MED ORDER — TRIAMCINOLONE ACETONIDE 40 MG/ML IJ SUSP
INTRAMUSCULAR | Status: AC
  Filled 2020-07-27: qty 1

## 2020-07-27 MED ORDER — TRIAMCINOLONE ACETONIDE 40 MG/ML IJ SUSP
40.0000 mg | Freq: Once | INTRAMUSCULAR | Status: AC
  Administered 2020-07-27: 40 mg
  Filled 2020-07-27: qty 1

## 2020-07-27 MED ORDER — ROPIVACAINE HCL 2 MG/ML IJ SOLN
2.0000 mL | Freq: Once | INTRAMUSCULAR | Status: AC
  Administered 2020-07-27: 2 mL via EPIDURAL
  Filled 2020-07-27: qty 10

## 2020-07-27 MED ORDER — IOHEXOL 180 MG/ML  SOLN
INTRAMUSCULAR | Status: AC
  Filled 2020-07-27: qty 20

## 2020-07-27 MED ORDER — SODIUM CHLORIDE 0.9% FLUSH
2.0000 mL | Freq: Once | INTRAVENOUS | Status: AC
  Administered 2020-07-27: 2 mL

## 2020-07-27 NOTE — Progress Notes (Signed)
Safety precautions to be maintained throughout the outpatient stay will include: orient to surroundings, keep bed in low position, maintain call bell within reach at all times, provide assistance with transfer out of bed and ambulation.  

## 2020-07-27 NOTE — Telephone Encounter (Signed)
I have placed in your folder to sign.

## 2020-07-27 NOTE — Patient Instructions (Addendum)
____________________________________________________________________________________________  Post-Procedure Discharge Instructions  Instructions:  Apply ice:   Purpose: This will minimize any swelling and discomfort after procedure.   When: Day of procedure, as soon as you get home.  How: Fill a plastic sandwich bag with crushed ice. Cover it with a small towel and apply to injection site.  How long: (15 min on, 15 min off) Apply for 15 minutes then remove x 15 minutes.  Repeat sequence on day of procedure, until you go to bed.  Apply heat:   Purpose: To treat any soreness and discomfort from the procedure.  When: Starting the next day after the procedure.  How: Apply heat to procedure site starting the day following the procedure.  How long: May continue to repeat daily, until discomfort goes away.  Food intake: Start with clear liquids (like water) and advance to regular food, as tolerated.   Physical activities: Keep activities to a minimum for the first 8 hours after the procedure. After that, then as tolerated.  Driving: If you have received any sedation, be responsible and do not drive. You are not allowed to drive for 24 hours after having sedation.  Blood thinner: (Applies only to those taking blood thinners) You may restart your blood thinner 6 hours after your procedure.  Insulin: (Applies only to Diabetic patients taking insulin) As soon as you can eat, you may resume your normal dosing schedule.  Infection prevention: Keep procedure site clean and dry. Shower daily and clean area with soap and water.  Post-procedure Pain Diary: Extremely important that this be done correctly and accurately. Recorded information will be used to determine the next step in treatment. For the purpose of accuracy, follow these rules:  Evaluate only the area treated. Do not report or include pain from an untreated area. For the purpose of this evaluation, ignore all other areas of pain,  except for the treated area.  After your procedure, avoid taking a long nap and attempting to complete the pain diary after you wake up. Instead, set your alarm clock to go off every hour, on the hour, for the initial 8 hours after the procedure. Document the duration of the numbing medicine, and the relief you are getting from it.  Do not go to sleep and attempt to complete it later. It will not be accurate. If you received sedation, it is likely that you were given a medication that may cause amnesia. Because of this, completing the diary at a later time may cause the information to be inaccurate. This information is needed to plan your care.  Follow-up appointment: Keep your post-procedure follow-up evaluation appointment after the procedure (usually 2 weeks for most procedures, 6 weeks for radiofrequencies). DO NOT FORGET to bring you pain diary with you.   Expect: (What should I expect to see with my procedure?)  From numbing medicine (AKA: Local Anesthetics): Numbness or decrease in pain. You may also experience some weakness, which if present, could last for the duration of the local anesthetic.  Onset: Full effect within 15 minutes of injected.  Duration: It will depend on the type of local anesthetic used. On the average, 1 to 8 hours.   From steroids (Applies only if steroids were used): Decrease in swelling or inflammation. Once inflammation is improved, relief of the pain will follow.  Onset of benefits: Depends on the amount of swelling present. The more swelling, the longer it will take for the benefits to be seen. In some cases, up to 10 days.    Duration: Steroids will stay in the system x 2 weeks. Duration of benefits will depend on multiple posibilities including persistent irritating factors.  Side-effects: If present, they may typically last 2 weeks (the duration of the steroids).  Frequent: Cramps (if they occur, drink Gatorade and take over-the-counter Magnesium 450-500 mg  once to twice a day); water retention with temporary weight gain; increases in blood sugar; decreased immune system response; increased appetite.  Occasional: Facial flushing (red, warm cheeks); mood swings; menstrual changes.  Uncommon: Long-term decrease or suppression of natural hormones; bone thinning. (These are more common with higher doses or more frequent use. This is why we prefer that our patients avoid having any injection therapies in other practices.)   Very Rare: Severe mood changes; psychosis; aseptic necrosis.  From procedure: Some discomfort is to be expected once the numbing medicine wears off. This should be minimal if ice and heat are applied as instructed.  Call if: (When should I call?)  You experience numbness and weakness that gets worse with time, as opposed to wearing off.  New onset bowel or bladder incontinence. (Applies only to procedures done in the spine)  Emergency Numbers:  Durning business hours (Monday - Thursday, 8:00 AM - 4:00 PM) (Friday, 9:00 AM - 12:00 Noon): [phone]  After hours: [phone]  NOTE: If you are having a problem and are unable connect with, or to talk to a provider, then go to your nearest urgent care or emergency department. If the problem is serious and urgent, please call 911. ____________________________________________________________________________________________   Epidural Steroid Injection  An epidural steroid injection is a shot of steroid medicine and numbing medicine that is given into the space between the spinal cord and the bones of the back (epidural space). The shot helps relieve pain caused by an irritated or swollen nerve root. The amount of pain relief you get from the injection depends on what is causing the nerve to be swollen and irritated, and how long your pain lasts. You are more likely to benefit from this injection if your pain is strong and comes on suddenly rather than if you have had  long-term (chronic) pain. Tell a health care provider about:  Any allergies you have.  All medicines you are taking, including vitamins, herbs, eye drops, creams, and over-the-counter medicines.  Any problems you or family members have had with anesthetic medicines.  Any blood disorders you have.  Any surgeries you have had.  Any medical conditions you have.  Whether you are pregnant or may be pregnant. What are the risks? Generally, this is a safe procedure. However, problems may occur, including:  Headache.  Bleeding.  Infection.  Allergic reaction to medicines.  Nerve damage. What happens before the procedure? Staying hydrated Follow instructions from your health care provider about hydration, which may include:  Up to 2 hours before the procedure - you may continue to drink clear liquids, such as water, clear fruit juice, black coffee, and plain tea. Eating and drinking restrictions Follow instructions from your health care provider about eating and drinking, which may include:  8 hours before the procedure - stop eating heavy meals or foods, such as meat, fried foods, or fatty foods.  6 hours before the procedure - stop eating light meals or foods, such as toast or cereal.  6 hours before the procedure - stop drinking milk or drinks that contain milk.  2 hours before the procedure - stop drinking clear liquids. Medicines  You may be given   medicines to lower anxiety.  Ask your health care provider about: ? Changing or stopping your regular medicines. This is especially important if you are taking diabetes medicines or blood thinners. ? Taking medicines such as aspirin and ibuprofen. These medicines can thin your blood. Do not take these medicines unless your health care provider tells you to take them. ? Taking over-the-counter medicines, vitamins, herbs, and supplements.  Ask your health care provider what steps will be taken to prevent infection. General  instructions  Plan to have someone take you home from the hospital or clinic.  If you will be going home right after the procedure, plan to have someone with you for 24 hours. What happens during the procedure?  An IV will be inserted into one of your veins.  You will be given one or more of the following: ? A medicine to help you relax (sedative). ? A medicine to numb the area (local anesthetic).  You will be asked to lie on your abdomen or sit.  The injection site will be cleaned.  A needle will be inserted through your skin into the epidural space. This may cause you some discomfort. An X-ray machine will be used to guide the needle as close as possible to the affected nerve.  A steroid medicine and a local anesthetic will be injected into the epidural space.  The needle and IV will be removed.  A bandage (dressing) will be put over the injection site. The procedure may vary among health care providers and hospitals. What can I expect after the procedure? Follow these instructions at home: Injection site care  You may remove the bandage (dressing) after 24 hours.  Check your injection site every day for signs of infection. Check for: ? Redness, swelling, or pain. ? Fluid or blood. ? Warmth. ? Pus or a bad smell. Managing pain, stiffness, and swelling  For 24 hours after the procedure: ? Avoid using heat on the injection site. ? Do not take baths, swim, or use a hot tub until your health care provider approves. Ask your health care provider if you may take a shower. You may only be allowed to take sponge baths.  If directed, put ice on the injection site. To do this: ? Put ice in a plastic bag. ? Place a towel between your skin and the bag. ? Leave the ice on for 20 minutes, 2-3 times a day.  Activity  Do not drive for 24 hours if you were given a sedative during your procedure.  Return to your normal activities as told by your health care provider. Ask your  health care provider what activities are safe for you. General instructions  Your blood pressure, heart rate, breathing rate, and blood oxygen level will be monitored until you leave the hospital or clinic.  Your arm or leg may feel weak or numb for a few hours.  The injection site may feel sore.  Take over-the-counter and prescription medicines only as told by your health care provider.  Drink enough fluid to keep your urine pale yellow.  Keep all follow-up visits as told by your health care provider. This is important. Contact a health care provider if:  You have any of these signs of infection: ? Redness, swelling, or pain around your injection site. ? Fluid or blood coming from your injection site. ? Warmth coming from your injection site. ? Pus or a bad smell coming from your injection site. ? A fever.  You continue to   have pain and soreness around the injection site, even after taking over-the-counter pain medicine.  You have severe, sudden, or lasting nausea or vomiting. Get help right away if:  You have severe pain at the injection site that is not relieved by medicines.  You develop a severe headache or a stiff neck.  You become sensitive to light.  You have any new numbness or weakness in your legs or arms.  You lose control of your bladder or bowel movements.  You have trouble breathing. Summary  An epidural steroid injection is a shot of steroid medicine and numbing medicine that is given into the epidural space.  The shot helps relieve pain caused by an irritated or swollen nerve root.  You are more likely to benefit from this injection if your pain is strong and comes on suddenly rather than if you have had chronic pain. This information is not intended to replace advice given to you by your health care provider. Make sure you discuss any questions you have with your health care provider. Document Revised: 04/12/2019 Document Reviewed: 04/12/2019 Elsevier  Patient Education  2020 [employer]  

## 2020-07-27 NOTE — Telephone Encounter (Signed)
Pt dropped off disability parking placard to be filled out. Placed in colored folders for [employer].

## 2020-07-28 ENCOUNTER — Telehealth: Payer: Self-pay

## 2020-07-28 NOTE — Telephone Encounter (Signed)
Denies any needs at this time. Instructed to call if needed. Patient with understanding.

## 2020-07-31 ENCOUNTER — Ambulatory Visit: Payer: Self-pay | Admitting: Urology

## 2020-08-11 ENCOUNTER — Other Ambulatory Visit: Payer: Self-pay

## 2020-08-11 ENCOUNTER — Ambulatory Visit
Admission: RE | Admit: 2020-08-11 | Discharge: 2020-08-11 | Disposition: A | Discharge status: Home / Self Care | Payer: Medicare HMO | Source: Ambulatory Visit | Attending: Pain Medicine | Admitting: Pain Medicine

## 2020-08-11 DIAGNOSIS — M545 Low back pain, unspecified: Secondary | ICD-10-CM | POA: Diagnosis not present

## 2020-08-11 DIAGNOSIS — M5417 Radiculopathy, lumbosacral region: Secondary | ICD-10-CM | POA: Diagnosis not present

## 2020-08-11 DIAGNOSIS — M5441 Lumbago with sciatica, right side: Secondary | ICD-10-CM | POA: Insufficient documentation

## 2020-08-11 DIAGNOSIS — M5136 Other intervertebral disc degeneration, lumbar region: Secondary | ICD-10-CM

## 2020-08-11 DIAGNOSIS — G8929 Other chronic pain: Secondary | ICD-10-CM | POA: Diagnosis not present

## 2020-08-11 DIAGNOSIS — M4310 Spondylolisthesis, site unspecified: Secondary | ICD-10-CM | POA: Insufficient documentation

## 2020-08-11 DIAGNOSIS — M5137 Other intervertebral disc degeneration, lumbosacral region: Secondary | ICD-10-CM | POA: Diagnosis not present

## 2020-08-11 DIAGNOSIS — M79604 Pain in right leg: Secondary | ICD-10-CM | POA: Insufficient documentation

## 2020-08-11 DIAGNOSIS — M4807 Spinal stenosis, lumbosacral region: Secondary | ICD-10-CM | POA: Diagnosis not present

## 2020-08-11 DIAGNOSIS — M79605 Pain in left leg: Secondary | ICD-10-CM | POA: Insufficient documentation

## 2020-08-14 ENCOUNTER — Ambulatory Visit: Payer: Medicare HMO | Admitting: Urology

## 2020-08-14 ENCOUNTER — Encounter: Payer: Self-pay | Admitting: Urology

## 2020-08-14 ENCOUNTER — Ambulatory Visit: Payer: Medicare HMO | Admitting: Pain Medicine

## 2020-08-21 ENCOUNTER — Encounter: Payer: Self-pay | Admitting: Family

## 2020-08-21 ENCOUNTER — Telehealth (INDEPENDENT_AMBULATORY_CARE_PROVIDER_SITE_OTHER): Payer: Medicare HMO | Admitting: Family

## 2020-08-21 ENCOUNTER — Telehealth: Payer: Self-pay

## 2020-08-21 ENCOUNTER — Other Ambulatory Visit: Payer: Self-pay

## 2020-08-21 VITALS — Ht 63.0 in | Wt 287.0 lb

## 2020-08-21 DIAGNOSIS — Z6841 Body Mass Index (BMI) 40.0 and over, adult: Secondary | ICD-10-CM | POA: Diagnosis not present

## 2020-08-21 DIAGNOSIS — R69 Illness, unspecified: Secondary | ICD-10-CM | POA: Diagnosis not present

## 2020-08-21 DIAGNOSIS — I10 Essential (primary) hypertension: Secondary | ICD-10-CM

## 2020-08-21 DIAGNOSIS — R319 Hematuria, unspecified: Secondary | ICD-10-CM | POA: Diagnosis not present

## 2020-08-21 DIAGNOSIS — R195 Other fecal abnormalities: Secondary | ICD-10-CM | POA: Insufficient documentation

## 2020-08-21 DIAGNOSIS — F331 Major depressive disorder, recurrent, moderate: Secondary | ICD-10-CM | POA: Diagnosis not present

## 2020-08-21 MED ORDER — DULOXETINE HCL 30 MG PO CPEP: ORAL_CAPSULE | ORAL | 3 refills | Status: DC

## 2020-08-21 NOTE — Progress Notes (Signed)
Virtual Visit via Video Note  I connected with@  on 08/23/20 at  4:00 PM EST by a video enabled telemedicine application and verified that I am speaking with the correct person using two identifiers.  Location patient: home Location provider:work  Persons participating in the virtual visit: patient, provider  I discussed the limitations of evaluation and management by telemedicine and the availability of in person appointments. The patient expressed understanding and agreed to proceed.   HPI:  Discuss obesity and working on weight loss She would like referral for a nutritionist.  Would like refill of mobic 15mg  to take as needed for left knee pain, planning knee replacement. Uses tramadol rarely.   No h/o gib.   Complains of loose stools for 1 month after she eats. Loose stool had been 3 times each day, yesterday and today occurred once.  No blood in stool.  Endorses urgency. No fecal incontinence. She uses splenda, stevia, crystal light daily. She stopped coffee 3 weeks ago and not sure if helpful.  Stool is rown in color. No fever, abdominal pain. Loose stool occurs after a meal; ice cream is particularly bothersome.  No recent antibiotics. New medication includes oxybutynin.  States since cholecystectomy , stools have not been 'very formed' however past month stool is more loose.   Colonoscopy UTD  HTN- compliant with hctz, potassium chloride. Blood pressure has been 'so well controlled' she hasnt taken BP at home lately. No CP, sob.  Depression- knee pain and depression ' go hand in hand. '  No si/hi  Established with pain management Dr [facility]. IFA negative 4 weeks ago and had consult with Dr Posey Pronto ; doesn't suspect pain to be from connective tissue disease, advised consideration of cymbalta.   Following with Dr Matilde Sprang for hematuria. Never at CT for hematuria work up.   ROS: See pertinent positives and negatives per HPI.    EXAM:  VITALS per patient if  applicable: Ht 5\' 3"  (1.6 m)   Wt 287 lb (130.2 kg)   BMI 50.84 kg/m  BP Readings from Last 3 Encounters:  07/27/20 (!) 147/80  07/18/20 119/89  06/05/20 (!) 152/85   Wt Readings from Last 3 Encounters:  08/21/20 287 lb (130.2 kg)  07/27/20 293 lb (132.9 kg)  07/18/20 292 lb (132.5 kg)    GENERAL: alert, oriented, appears well and in no acute distress  HEENT: atraumatic, conjunttiva clear, no obvious abnormalities on inspection of external nose and ears  NECK: normal movements of the head and neck  LUNGS: on inspection no signs of respiratory distress, breathing rate appears normal, no obvious gross SOB, gasping or wheezing  CV: no obvious cyanosis  MS: moves all visible extremities without noticeable abnormality  PSYCH/NEURO: pleasant and cooperative, no obvious depression or anxiety, speech and thought processing grossly intact  ASSESSMENT AND PLAN:  Discussed the following assessment and plan:  Problem List Items Addressed This Visit      Cardiovascular and Mediastinum   Essential hypertension    Concerned that we may not have optimal control. Emphasized the importance of checking BP in local pharmacy as well as follow up here in person.       Relevant Orders   Comprehensive metabolic panel     Other   BMI 50.0-59.9, adult ([place]) - Primary    Discussed exacerbation of depression, weight gain, joint pain. At follow up we will discuss further medications to aid in weight loss.       Relevant Orders  TSH   Hemoglobin A1c   Lipid panel   Comprehensive metabolic panel   Referral to Nutrition and Diabetes Services   Depression, major, recurrent, moderate (HCC)    Worsening . Trial cymbalta. Close follow up      Relevant Medications   DULoxetine (CYMBALTA) 30 MG capsule   Hematuria    Lost to follow up with urology, emphasized the importance of completing work up and provided phone number for her to call. She will make follow up appointment.       Loose  stools    Improved the last 2 days. Acute on chronic since cholecystectomy. Discussed artifical sweeteners, lactose as in part contributory. She will try stop both and let me know if loose stool doesn't improve. We will discuss consult with GI, FOD MAP diet at follow up if persistent.        Relevant Orders   TSH   Celiac Disease Panel      -we discussed possible serious and likely etiologies, options for evaluation and workup, limitations of telemedicine visit vs in person visit, treatment, treatment risks and precautions. Pt prefers to treat via telemedicine empirically rather then risking or undertaking an in person visit at this moment.  .   I discussed the assessment and treatment plan with the patient. The patient was provided an opportunity to ask questions and all were answered. The patient agreed with the plan and demonstrated an understanding of the instructions.   The patient was advised to call back or seek an in-person evaluation if the symptoms worsen or if the condition fails to improve as anticipated.   Mable Paris, FNP

## 2020-08-21 NOTE — Patient Instructions (Signed)
Trial of holding ALL artificial sweetners Then trial holding lactose products  Trial of cymbalta  Follow up in person so we can check blood pressure  Please call urology office as we discussed

## 2020-08-21 NOTE — Telephone Encounter (Signed)
Close  

## 2020-08-23 ENCOUNTER — Encounter: Payer: Self-pay | Admitting: Pain Medicine

## 2020-08-23 ENCOUNTER — Other Ambulatory Visit: Payer: Self-pay

## 2020-08-23 ENCOUNTER — Ambulatory Visit: Payer: Medicare HMO | Attending: Pain Medicine | Admitting: Pain Medicine

## 2020-08-23 VITALS — BP 133/82 | HR 97 | Temp 98.1°F | Ht 63.0 in | Wt 287.0 lb

## 2020-08-23 DIAGNOSIS — E559 Vitamin D deficiency, unspecified: Secondary | ICD-10-CM

## 2020-08-23 DIAGNOSIS — M5441 Lumbago with sciatica, right side: Secondary | ICD-10-CM | POA: Diagnosis not present

## 2020-08-23 DIAGNOSIS — M1712 Unilateral primary osteoarthritis, left knee: Secondary | ICD-10-CM | POA: Diagnosis not present

## 2020-08-23 DIAGNOSIS — Z79899 Other long term (current) drug therapy: Secondary | ICD-10-CM

## 2020-08-23 DIAGNOSIS — G8929 Other chronic pain: Secondary | ICD-10-CM | POA: Diagnosis not present

## 2020-08-23 DIAGNOSIS — M47817 Spondylosis without myelopathy or radiculopathy, lumbosacral region: Secondary | ICD-10-CM | POA: Diagnosis not present

## 2020-08-23 DIAGNOSIS — G894 Chronic pain syndrome: Secondary | ICD-10-CM

## 2020-08-23 DIAGNOSIS — Z6841 Body Mass Index (BMI) 40.0 and over, adult: Secondary | ICD-10-CM

## 2020-08-23 DIAGNOSIS — M25562 Pain in left knee: Secondary | ICD-10-CM

## 2020-08-23 MED ORDER — TRAMADOL HCL 50 MG PO TABS: 100.0000 mg | ORAL_TABLET | Freq: Four times a day (QID) | ORAL | 2 refills | Status: DC | PRN

## 2020-08-23 NOTE — Assessment & Plan Note (Signed)
Discussed exacerbation of depression, weight gain, joint pain. At follow up we will discuss further medications to aid in weight loss.

## 2020-08-23 NOTE — Assessment & Plan Note (Signed)
Lost to follow up with urology, emphasized the importance of completing work up and provided phone number for her to call. She will make follow up appointment.

## 2020-08-23 NOTE — Assessment & Plan Note (Signed)
Concerned that we may not have optimal control. Emphasized the importance of checking BP in local pharmacy as well as follow up here in person.

## 2020-08-23 NOTE — Progress Notes (Signed)
Nursing Pain Medication Assessment:  Safety precautions to be maintained throughout the outpatient stay will include: orient to surroundings, keep bed in low position, maintain call bell within reach at all times, provide assistance with transfer out of bed and ambulation.  Medication Inspection Compliance: Ms. Carelli did not comply with our request to bring her pills to be counted. She was reminded that bringing the medication bottles, even when empty, is a requirement.  Medication: None brought in. Pill/Patch Count: None available to be counted. Bottle Appearance: No container available. Did not bring bottle(s) to appointment. Filled Date: N/A Last Medication intake:  TodaySafety precautions to be maintained throughout the outpatient stay will include: orient to surroundings, keep bed in low position, maintain call bell within reach at all times, provide assistance with transfer out of bed and ambulation.

## 2020-08-23 NOTE — Assessment & Plan Note (Signed)
Worsening . Trial cymbalta. Close follow up

## 2020-08-23 NOTE — Progress Notes (Signed)
PROVIDER NOTE: Information contained herein reflects review and annotations entered in association with encounter. Interpretation of such information and data should be left to medically-trained personnel. Information provided to patient can be located elsewhere in the medical record under "Patient Instructions". Document created using STT-dictation technology, any transcriptional errors that may result from process are unintentional.    Patient: Jacqueline Delgado  Service Category: E/M  Provider: Gaspar Cola, MD  DOB: 16-Oct-1950  DOS: 08/23/2020  Specialty: Interventional Pain Management  MRN: [id number]  Setting: Ambulatory outpatient  PCP: Burnard Hawthorne, FNP  Type: Established Patient    Referring Provider: Burnard Hawthorne, FNP  Location: Office  Delivery: Face-to-face     HPI  Jacqueline Delgado, a 69 y.o. year old female, is here today because of her Chronic pain syndrome [G89.4]. [name] primary complain today is Back Pain Last encounter: My last encounter with her was on 07/27/2020. Pertinent problems: [name] has Plantar fasciitis; Localized swelling of finger of left hand; Acquired trigger finger; Closed nondisplaced fracture of base of fifth metacarpal bone of right hand; Complete tear of right rotator cuff; DDD (degenerative disc disease), cervical; DDD (degenerative disc disease), lumbar; Chronic low back pain (1ry area of Pain) (Bilateral) (R>L) w/ sciatica (Right); Neck pain; Non-cardiac chest pain; S/P rotator cuff repair; Osteoarthritis, knee; Unspecified osteoarthritis, unspecified site; Osteoarthritis of knee (Left); Ankle swelling, right; Chronic pain syndrome; DDD (degenerative disc disease), lumbosacral; Lumbar facet hypertrophy; Abnormal MRI, lumbar spine (04/12/2013); Lumbosacral central spinal stenosis (9 mm) (L4-5 and L5-S1); Chronic lower extremity pain (Right); Lumbosacral radiculopathy at L5 (Right); Chronic knee pain (3ry area of Pain) (Left); Swelling of  foot (4th area of Pain) (Right); Chronic lower extremity pain (2ry area of Pain) (Bilateral) (R>L); Calcaneal spur of foot (Right); Lumbar Grade 1 Anterolisthesis of L4/L5; and Lumbosacral facet syndrome (Bilateral) (R>L) on their pertinent problem list. Pain Assessment: Severity of Chronic pain is reported as a 10-Worst pain ever/10. Location: Back Right, Lower, Left/Denies. Onset: More than a month ago. Quality: Aching, Constant, Pressure. Timing: Constant. Modifying factor(s): heating pad. Vitals:  height is _0  (1.6 m) and weight is 287 lb (130.2 kg). Her temperature is 98.1 F (36.7 C). Her blood pressure is 133/82 and her pulse is 97. Her oxygen saturation is 95%.   Reason for encounter: both, medication management and post-procedure assessment.  The patient indicates doing well with the current medication regimen. No adverse reactions or side effects reported to the medications.  Today I spent quite some time trying to figure out the results of the lumbar epidural steroid injection.  She indicates having attained 100% relief of the pain that lasted approximately 3 to 4 days.  After that, it began to wear off and it went down to about a 40% that lasted another 3 to 4 days.  However she pointed out that at no time she experienced any relief of the pain in her left knee.  Today she describes her pain as being primarily in the area of the left knee over the anterior medial portion of the knee, at the level of the patella.  In terms of the low back pain it is bilateral, primarily in the midline but also spreading to both sides with the right being worse than the left.  When he comes to the lower extremity pain, it is bilateral with the left being worse than the right however the left lower extremity pain is primarily just in the area of the knee.  This pain is constant.  In the case of the right lower extremity it does not seem to go past the knee and it is intermittent.  Today she was questioning whether  or not some of this low back pain was secondary to her limping due to the left knee pain.  To me it clearly has a component of compensatory pain from the limping.  However, today we reviewed the patient's MRI and she clearly has problems from the L3-4 down to the L5-S1 with degenerative disc disease as well as facet arthropathy.  Today's examination is positive for bilateral lumbar facet syndrome and arthralgia.  In view of the above findings, we have decided to bring the patient in for a diagnostic bilateral lumbar facet block under fluoroscopic guidance and IV sedation.  Hopefully she will have a chance to then compare the results of this diagnostic injection to those of the right L4-5 LESI.  In addition, when she returns we will also try to do a left intra-articular knee injection to see if we can help with some of the pain that she is experiencing in that area.  We will try to get a medial approach since that is the area where she is having most of the pain in the knee.  The patient was provided with the treatment plan which she understood and accepted.  RTCB: 11/21/2020  Post-Procedure Evaluation  Procedure (07/27/2020): Diagnostic/therapeutic right L4-5 LESI #1 under fluoroscopic guidance and IV sedation Pre-procedure pain level: 4/10 Post-procedure: 0/10 (100% relief)  Sedation: Sedation provided.  Effectiveness during initial hour after procedure(Ultra-Short Term Relief): 100 %.  Local anesthetic used: Long-acting (4-6 hours) Effectiveness: Defined as any analgesic benefit obtained secondary to the administration of local anesthetics. This carries significant diagnostic value as to the etiological location, or anatomical origin, of the pain. Duration of benefit is expected to coincide with the duration of the local anesthetic used.  Effectiveness during initial 4-6 hours after procedure(Short-Term Relief): 100 %.  Long-term benefit: Defined as any relief past the pharmacologic duration of the  local anesthetics.  Effectiveness past the initial 6 hours after procedure(Long-Term Relief): 40 % (for 3 days).  When I questioned patient about long-term benefit she indicated that she actually had numbness with 100% relief of her low back pain for approximately 3 to 4 days.  After that the pain started coming back in for another 3 to 4 days she had approximately 40% benefit.  After that.  Time, then it wore off.  She also indicated that at no time she had 100% relief of the left knee pain.  Current benefits: Defined as benefit that persist at this time.   Analgesia:  Back to baseline Function: Back to baseline ROM: Back to baseline  Pharmacotherapy Assessment   Analgesic: Hydrocodone/homatropine solution #120 mL, 5 mL 4 times daily (20 MME/day) MME/day: 20 mg/day   Monitoring: [place] PMP: PDMP reviewed during this encounter.       Pharmacotherapy: No side-effects or adverse reactions reported. Compliance: No problems identified. Effectiveness: Clinically acceptable.  Chauncey Fischer, RN  08/23/2020  2:35 PM  Sign when Signing Visit Nursing Pain Medication Assessment:  Safety precautions to be maintained throughout the outpatient stay will include: orient to surroundings, keep bed in low position, maintain call bell within reach at all times, provide assistance with transfer out of bed and ambulation.  Medication Inspection Compliance: Ms. Lombard did not comply with our request to bring her pills to be counted. She was reminded that bringing  the medication bottles, even when empty, is a requirement.  Medication: None brought in. Pill/Patch Count: None available to be counted. Bottle Appearance: No container available. Did not bring bottle(s) to appointment. Filled Date: N/A Last Medication intake:  TodaySafety precautions to be maintained throughout the outpatient stay will include: orient to surroundings, keep bed in low position, maintain call bell within reach at all times, provide  assistance with transfer out of bed and ambulation.     UDS:  Summary  Date Value Ref Range Status  05/22/2020 Note  Final    Comment:    ==================================================================== Compliance Drug Analysis, Ur ==================================================================== Test                             Result       Flag       Units  Drug Present and Declared for Prescription Verification   Tramadol                       >3268        EXPECTED   ng/mg creat   O-Desmethyltramadol            2372         EXPECTED   ng/mg creat   N-Desmethyltramadol            1509         EXPECTED   ng/mg creat    Source of tramadol is a prescription medication. O-desmethyltramadol    and N-desmethyltramadol are expected metabolites of tramadol.    Bupropion                      PRESENT      EXPECTED   Hydroxybupropion               PRESENT      EXPECTED    Hydroxybupropion is an expected metabolite of bupropion.  Drug Present not Declared for Prescription Verification   Dextromethorphan               PRESENT      UNEXPECTED   Dextrorphan/Levorphanol        PRESENT      UNEXPECTED    Dextrorphan is an expected metabolite of dextromethorphan, an over-    the-counter or prescription cough suppressant. Dextrorphan cannot be    distinguished from the scheduled prescription medication levorphanol    by the method used for analysis.  ==================================================================== Test                      Result    Flag   Units      Ref Range   Creatinine              153              mg/dL      >=20 ==================================================================== Declared Medications:  The flagging and interpretation on this report are based on the  following declared medications.  Unexpected results may arise from  inaccuracies in the declared medications.   **Note: The testing scope of this panel includes these medications:   Bupropion  (Wellbutrin)  Tramadol (Ultram)   **Note: The testing scope of this panel does not include the  following reported medications:   Famotidine (Pepcid)  Hydrochlorothiazide (Hydrodiuril)  Losartan (Cozaar)  Meloxicam (Mobic)  Mirabegron (Myrbetriq)  Rabeprazole (Aciphex) ==================================================================== For clinical consultation, please call (  [phone]. ====================================================================      ROS  Constitutional: Denies any fever or chills Gastrointestinal: No reported hemesis, hematochezia, vomiting, or acute GI distress Musculoskeletal: Denies any acute onset joint swelling, redness, loss of ROM, or weakness Neurological: No reported episodes of acute onset apraxia, aphasia, dysarthria, agnosia, amnesia, paralysis, loss of coordination, or loss of consciousness  Medication Review  DULoxetine, RABEprazole, Vitamin D3, famotidine, hydrochlorothiazide, losartan, oxybutynin, potassium chloride, and traMADol  History Review  Allergy: Ms. Driskill has No Known Allergies. Drug: Ms. Bogie  reports no history of drug use. Alcohol:  reports no history of alcohol use. Tobacco:  reports that she has never smoked. She has never used smokeless tobacco. Social: Ms. Laughridge  reports that she has never smoked. She has never used smokeless tobacco. She reports that she does not drink alcohol and does not use drugs. Medical:  has a past medical history of Anginal pain ([place]), Arthritis, Colon polyp, GERD (gastroesophageal reflux disease), Hiatal hernia, History of hiatal hernia, Hypertension, Obesity, Reflux, Sleep apnea, and Small vessel disease ([place]). Surgical: Ms. Tout  has a past surgical history that includes Joint replacement (Right); Joint replacement; Cholecystectomy; Abdominal hysterectomy; Shoulder arthroscopy with rotator cuff repair and subacromial decompression (Right, 06/14/2015); Esophagogastroduodenoscopy (egd)  with propofol (N/A, 10/11/2015); Savory dilation (N/A, 10/11/2015); Colonoscopy w/ polypectomy; Esophagogastroduodenoscopy (egd) with propofol (N/A, 12/10/2017); and Colonoscopy with propofol (N/A, 12/10/2017). Family: family history includes Alcoholism in an other family member; Arthritis in an other family member; Breast cancer in her maternal grandmother and another family member; Breast cancer (age of onset: 18) in her mother; Diabetes in an other family member; Heart disease in her father; Hyperlipidemia in her father; Hypertension in her father; Stroke in her father.  Laboratory Chemistry Profile   Renal Lab Results  Component Value Date   BUN 16 04/17/2020   CREATININE 0.91 04/17/2020   BCR 19 09/29/2019   GFR 79.49 09/21/2019   GFRAA >60 04/17/2020   GFRNONAA >60 04/17/2020     Hepatic Lab Results  Component Value Date   AST 33 04/17/2020   ALT 40 04/17/2020   ALBUMIN 3.1 (L) 04/17/2020   ALKPHOS 90 04/17/2020   LIPASE 36 10/12/2017     Electrolytes Lab Results  Component Value Date   NA 138 04/17/2020   K 3.1 (L) 04/17/2020   CL 98 04/17/2020   CALCIUM 8.6 (L) 04/17/2020   MG 2.0 05/22/2020     Bone Lab Results  Component Value Date   VD25OH 14.17 (L) 09/21/2019   25OHVITD1 18 (L) 05/22/2020   25OHVITD2 13 05/22/2020   25OHVITD3 5.3 05/22/2020     Inflammation (CRP: Acute Phase) (ESR: Chronic Phase) Lab Results  Component Value Date   CRP 15 (H) 05/22/2020   ESRSEDRATE 73 (H) 05/22/2020       Note: Above Lab results reviewed.  Recent Imaging Review  MR LUMBAR SPINE WO CONTRAST CLINICAL DATA:  Low back pain.  Osteoarthritis, lumbosacral.  EXAM: MRI LUMBAR SPINE WITHOUT CONTRAST  TECHNIQUE: Multiplanar, multisequence MR imaging of the lumbar spine was performed. No intravenous contrast was administered.  COMPARISON:  MRI of the lumbar spine April 12, 2013  FINDINGS: Segmentation:  Standard.  Alignment:  Physiologic.  Vertebrae:  No fracture,  evidence of discitis, or bone lesion.  Conus medullaris and cauda equina: Conus extends to the L1 level. Conus and cauda equina appear normal.  Paraspinal and other soft tissues: Bilateral renal cysts  Disc levels:  T12-L1: No spinal canal or neural foraminal stenosis.  L1-2: No spinal canal or neural foraminal stenosis.  L2-3: No spinal canal or neural foraminal stenosis.  L3-4: Shallow disc bulge, facet degenerative changes ligamentum flavum redundancy resulting in narrowing of the bilateral subarticular zones. No significant spinal canal or neural foraminal stenosis.  L4-5: Shallow disc bulge, moderate facet degenerative changes and ligamentum flavum redundancy without significant spinal canal or neural foraminal stenosis.  L5-S1: Disc bulge and moderate facet degenerative changes resulting in mild bilateral neural foraminal narrowing. No significant spinal canal stenosis.  No significant change from prior MRI.  IMPRESSION: Mild multilevel degenerative changes of the lumbar spine with mild bilateral neural foraminal narrowing at L5-S1 and mild narrowing of the bilateral subarticular zones at L3 for. No significant spinal canal stenosis.  Electronically Signed   By: Pedro Earls M.D.   On: 08/11/2020 22:51 Note: Reviewed        Physical Exam  General appearance: Well nourished, well developed, and well hydrated. In no apparent acute distress Mental status: Alert, oriented x 3 (person, place, & time)       Respiratory: No evidence of acute respiratory distress Eyes: PERLA Vitals: BP 133/82   Pulse 97   Temp 98.1 F (36.7 C)   Ht _0  (1.6 m)   Wt 287 lb (130.2 kg)   SpO2 95%   BMI 50.84 kg/m  BMI: Estimated body mass index is 50.84 kg/m as calculated from the following:   Height as of this encounter: _1  (1.6 m).   Weight as of this encounter: 287 lb (130.2 kg). Ideal: Ideal body weight: 52.4 kg (115 lb 8.3 oz) Adjusted ideal body  weight: 83.5 kg (184 lb 1.8 oz)  Assessment   Status Diagnosis  Controlled Controlled Controlled 1. Chronic pain syndrome   2. Lumbosacral facet syndrome (Bilateral) (R>L)   3. Chronic low back pain (1ry area of Pain) (Bilateral) (R>L) w/ sciatica (Right)   4. Chronic knee pain (3ry area of Pain) (Left)   5. Pharmacologic therapy   6. Vitamin D deficiency   7. BMI 50.0-59.9, adult ([place])   8. Osteoarthritis of knee (Left)      Updated Problems: Problem  Lumbosacral facet syndrome (Bilateral) (R>L)  Bmi 50.0-59.9, Adult ([facility])  Loose Stools    Plan of Care  Problem-specific:  No problem-specific Assessment & Plan notes found for this encounter.  Ms. JONATHON CASTELO has a current medication list which includes the following long-term medication(s): duloxetine, famotidine, losartan, potassium chloride, hydrochlorothiazide, and tramadol.  Pharmacotherapy (Medications Ordered): Meds ordered this encounter  Medications  . traMADol (ULTRAM) 50 MG tablet    Sig: Take 2 tablets (100 mg total) by mouth every 6 (six) hours as needed for severe pain. Must last 30 days    Dispense:  240 tablet    Refill:  2    Chronic Pain: STOP Act (Not applicable) Fill 1 day early if closed on refill date. Avoid benzodiazepines within 8 hours of opioids   Orders:  Orders Placed This Encounter  Procedures  . LUMBAR FACET(MEDIAL BRANCH NERVE BLOCK) MBNB    Standing Status:   Future    Standing Expiration Date:   09/22/2020    Scheduling Instructions:     Procedure: Lumbar facet block (AKA.: Lumbosacral medial branch nerve block)     Side: Bilateral     Level: L3-4, L4-5, & L5-S1 Facets (L2, L3, L4, L5, & S1 Medial Branch Nerves)     Sedation: Patient's choice.     Timeframe: ASAA  Order Specific Question:   Where will this procedure be performed?    Answer:   [facility]  . KNEE INJECTION    Local Anesthetic & Steroid injection.    Standing Status:   Future    Standing Expiration  Date:   11/23/2020    Scheduling Instructions:     Side: Left-sided     Sedation: None     Timeframe: As soon as schedule allows    Order Specific Question:   Where will this procedure be performed?    Answer:   [facility]   Follow-up plan:   Return for Procedure (w/ sedation): (B) L-FCT Blk #1 + (L) IA Knee Inj. (Steroid).      Interventional management options: Planned, scheduled, and/or pending:    It is clear that the patient has low back pain is probably secondary to osteoarthritis of the lumbar spine secondary to her class III morbid obesity causing facet arthropathy and subsequent low back pain.  However, she does have a component of a right sided L5 radiculopathy likely to be coming from a right L4-5 subarticular/lateral recess stenosis versus a far lateral disc herniation on the right side at the L5-S1 level and/or a right L5-S1 neural foraminal stenosis.  This osteoarthritis is also affecting both of her knees were she has already had to have a right knee replacement.  She is also very likely to be experiencing hip arthralgias, for the same reason.  Her morbid obesity is having some clear comorbidities including hypoventilation, obstructive sleep apnea, shortness of breath on exertion, hypertension, gastroesophageal reflux disease, etc.  This patient needs a medically supervised weight loss program and if no improvement is observed, then she should seriously consider other options such as bariatric surgery.  Her BMI goal  would be 30 or less.   Considering:   Diagnostic midline L4-5 LESI #1  Diagnostic bilateral lumbar facet block #1    PRN Procedures:   None at this time    Recent Visits Date Type Provider Dept  07/27/20 Procedure visit Milinda Pointer, MD [facility]  07/18/20 Office Visit Milinda Pointer, MD [facility]  Showing recent visits within past 90 days and meeting all other requirements Today's Visits Date Type Provider Dept   08/23/20 Office Visit Milinda Pointer, MD [facility]  Showing today's visits and meeting all other requirements Future Appointments No visits were found meeting these conditions. Showing future appointments within next 90 days and meeting all other requirements  I discussed the assessment and treatment plan with the patient. The patient was provided an opportunity to ask questions and all were answered. The patient agreed with the plan and demonstrated an understanding of the instructions.  Patient advised to call back or seek an in-person evaluation if the symptoms or condition worsens.  Duration of encounter: 30 minutes.  Note by: Gaspar Cola, MD Date: 08/23/2020; Time: 3:19 PM

## 2020-08-23 NOTE — Assessment & Plan Note (Signed)
Improved the last 2 days. Acute on chronic since cholecystectomy. Discussed artifical sweeteners, lactose as in part contributory. She will try stop both and let me know if loose stool doesn't improve. We will discuss consult with GI, FOD MAP diet at follow up if persistent.

## 2020-08-23 NOTE — Patient Instructions (Signed)
____________________________________________________________________________________________  Preparing for Procedure with Sedation  Procedure appointments are limited to planned procedures: . No Prescription Refills. . No disability issues will be discussed. . No medication changes will be discussed.  Instructions: . Oral Intake: Do not eat or drink anything for at least 8 hours prior to your procedure. (Exception: Blood Pressure Medication. See below.) . Transportation: Unless otherwise stated by your physician, you may drive yourself after the procedure. . Blood Pressure Medicine: Do not forget to take your blood pressure medicine with a sip of water the morning of the procedure. If your Diastolic (lower reading)is above 100 mmHg, elective cases will be cancelled/rescheduled. . Blood thinners: These will need to be stopped for procedures. Notify our staff if you are taking any blood thinners. Depending on which one you take, there will be specific instructions on how and when to stop it. . Diabetics on insulin: Notify the staff so that you can be scheduled 1st case in the morning. If your diabetes requires high dose insulin, take only  of your normal insulin dose the morning of the procedure and notify the staff that you have done so. . Preventing infections: Shower with an antibacterial soap the morning of your procedure. . Build-up your immune system: Take 1000 mg of Vitamin C with every meal (3 times a day) the day prior to your procedure. . Antibiotics: Inform the staff if you have a condition or reason that requires you to take antibiotics before dental procedures. . Pregnancy: If you are pregnant, call and cancel the procedure. . Sickness: If you have a cold, fever, or any active infections, call and cancel the procedure. . Arrival: You must be in the facility at least 30 minutes prior to your scheduled procedure. . Children: Do not bring children with you. . Dress appropriately:  Bring dark clothing that you would not mind if they get stained. . Valuables: Do not bring any jewelry or valuables.  Reasons to call and reschedule or cancel your procedure: (Following these recommendations will minimize the risk of a serious complication.) . Surgeries: Avoid having procedures within 2 weeks of any surgery. (Avoid for 2 weeks before or after any surgery). . Flu Shots: Avoid having procedures within 2 weeks of a flu shots or . (Avoid for 2 weeks before or after immunizations). . Barium: Avoid having a procedure within 7-10 days after having had a radiological study involving the use of radiological contrast. (Myelograms, Barium swallow or enema study). . Heart attacks: Avoid any elective procedures or surgeries for the initial 6 months after a "Myocardial Infarction" (Heart Attack). . Blood thinners: It is imperative that you stop these medications before procedures. Let us know if you if you take any blood thinner.  . Infection: Avoid procedures during or within two weeks of an infection (including chest colds or gastrointestinal problems). Symptoms associated with infections include: Localized redness, fever, chills, night sweats or profuse sweating, burning sensation when voiding, cough, congestion, stuffiness, runny nose, sore throat, diarrhea, nausea, vomiting, cold or Flu symptoms, recent or current infections. It is specially important if the infection is over the area that we intend to treat. . Heart and lung problems: Symptoms that may suggest an active cardiopulmonary problem include: cough, chest pain, breathing difficulties or shortness of breath, dizziness, ankle swelling, uncontrolled high or unusually low blood pressure, and/or palpitations. If you are experiencing any of these symptoms, cancel your procedure and contact your primary care physician for an evaluation.  Remember:  Regular Business hours are:    Monday to Thursday 8:00 AM to 4:00 PM  Provider's  Schedule: Arnetia Bronk, MD:  Procedure days: Tuesday and Thursday 7:30 AM to 4:00 PM  Bilal Lateef, MD:  Procedure days: Monday and Wednesday 7:30 AM to 4:00 PM ____________________________________________________________________________________________    

## 2020-08-24 ENCOUNTER — Other Ambulatory Visit: Payer: Self-pay | Admitting: Pain Medicine

## 2020-08-24 ENCOUNTER — Other Ambulatory Visit: Payer: Self-pay | Admitting: Family

## 2020-08-24 DIAGNOSIS — E559 Vitamin D deficiency, unspecified: Secondary | ICD-10-CM

## 2020-09-01 DIAGNOSIS — M1712 Unilateral primary osteoarthritis, left knee: Secondary | ICD-10-CM | POA: Diagnosis not present

## 2020-09-11 ENCOUNTER — Telehealth: Payer: Self-pay

## 2020-09-11 NOTE — Telephone Encounter (Signed)
Pt states that DULoxetine (CYMBALTA) 30 MG capsule is irritating her stomach and she has stopped taking it. Please call back to advise

## 2020-09-12 ENCOUNTER — Telehealth: Payer: Self-pay | Admitting: Family

## 2020-09-12 NOTE — Telephone Encounter (Signed)
Noted Labs ordered  Will discuss labs, symptoms during appt

## 2020-09-12 NOTE — Telephone Encounter (Signed)
See recent call note. Pt scheduled VV Friday.

## 2020-09-12 NOTE — Telephone Encounter (Signed)
FYI patient was upset bc she felt that front desk did not listen to her. She has been fatigued which has been discussed at previous appointments & her abdominal pain has caused from an awful flare of GERD. She stated that her acid reflux has been worse than ever & she stopped drinking coffee, which did help some. She said that the acid had been so bad it felt like almost chest pain & then one night the acid burned her throat causing pain like never before. Since then she has developed a cough. She wanted to have her labs done that were ordered future to see if her blood sugar could be causing her fatigue & she knows her weight has as well. I have scheduled her to have fasting labs done tomorrow. She  said that she was hoping that [employer] office would call back with a cancellation since her 11/17 appointment was cancelled due to him not being in office. She was hoping an urgent referral could be placed & she fears that her acid reflux may cause her to develop esophageal cancer. Also patient was unable to take Cymbalta. She had right much going on & I scheduled her a VV for Friday so hopefully labs will be back as well.

## 2020-09-12 NOTE — Telephone Encounter (Signed)
Pt called to schedule a VV.Marland Kitchen Pt stated that she is having abdominal pain and is fatigued she wanted to come in and do labs. I told her with those symptoms she would not be able to come in for labs. And the lab orders were at [facility]  Pt said that she will just go to the ED at [facility] . Pt then hung up

## 2020-09-13 ENCOUNTER — Other Ambulatory Visit: Payer: Self-pay

## 2020-09-13 ENCOUNTER — Other Ambulatory Visit (INDEPENDENT_AMBULATORY_CARE_PROVIDER_SITE_OTHER): Payer: Medicare HMO

## 2020-09-13 DIAGNOSIS — R195 Other fecal abnormalities: Secondary | ICD-10-CM | POA: Diagnosis not present

## 2020-09-13 DIAGNOSIS — I10 Essential (primary) hypertension: Secondary | ICD-10-CM

## 2020-09-13 DIAGNOSIS — Z6841 Body Mass Index (BMI) 40.0 and over, adult: Secondary | ICD-10-CM | POA: Diagnosis not present

## 2020-09-14 ENCOUNTER — Other Ambulatory Visit: Payer: Self-pay | Admitting: Family

## 2020-09-14 DIAGNOSIS — F331 Major depressive disorder, recurrent, moderate: Secondary | ICD-10-CM

## 2020-09-15 ENCOUNTER — Encounter: Payer: Self-pay | Admitting: Family

## 2020-09-15 ENCOUNTER — Telehealth (INDEPENDENT_AMBULATORY_CARE_PROVIDER_SITE_OTHER): Payer: Medicare HMO | Admitting: Family

## 2020-09-15 DIAGNOSIS — K219 Gastro-esophageal reflux disease without esophagitis: Secondary | ICD-10-CM | POA: Diagnosis not present

## 2020-09-15 DIAGNOSIS — I10 Essential (primary) hypertension: Secondary | ICD-10-CM

## 2020-09-15 DIAGNOSIS — R195 Other fecal abnormalities: Secondary | ICD-10-CM | POA: Diagnosis not present

## 2020-09-15 LAB — COMPREHENSIVE METABOLIC PANEL
ALT: 30 IU/L (ref 0–32)
AST: 19 IU/L (ref 0–40)
Albumin/Globulin Ratio: 1.5 (ref 1.2–2.2)
Albumin: 3.8 g/dL (ref 3.8–4.8)
Alkaline Phosphatase: 87 IU/L (ref 44–121)
BUN/Creatinine Ratio: 15 (ref 12–28)
BUN: 13 mg/dL (ref 8–27)
Bilirubin Total: 0.3 mg/dL (ref 0.0–1.2)
CO2: 27 mmol/L (ref 20–29)
Calcium: 9.5 mg/dL (ref 8.7–10.3)
Chloride: 105 mmol/L (ref 96–106)
Creatinine, Ser: 0.85 mg/dL (ref 0.57–1.00)
GFR calc Af Amer: 81 mL/min/{1.73_m2} (ref >59)
GFR calc non Af Amer: 71 mL/min/{1.73_m2} (ref >59)
Globulin, Total: 2.6 g/dL (ref 1.5–4.5)
Glucose: 93 mg/dL (ref 65–99)
Potassium: 3.5 mmol/L (ref 3.5–5.2)
Sodium: 144 mmol/L (ref 134–144)
Total Protein: 6.4 g/dL (ref 6.0–8.5)

## 2020-09-15 LAB — HEMOGLOBIN A1C
Est. average glucose Bld gHb Est-mCnc: 128 mg/dL
Hgb A1c MFr Bld: 6.1 % — ABNORMAL HIGH (ref 4.8–5.6)

## 2020-09-15 LAB — LIPID PANEL
Chol/HDL Ratio: 3.5 ratio (ref 0.0–4.4)
Cholesterol, Total: 134 mg/dL (ref 100–199)
HDL: 38 mg/dL — ABNORMAL LOW (ref >39)
LDL Chol Calc (NIH): 83 mg/dL (ref 0–99)
Triglycerides: 64 mg/dL (ref 0–149)
VLDL Cholesterol Cal: 13 mg/dL (ref 5–40)

## 2020-09-15 LAB — CELIAC DISEASE PANEL
Endomysial IgA: NEGATIVE
IgA/Immunoglobulin A, Serum: 350 mg/dL (ref 87–352)
Transglutaminase IgA: <2 U/mL (ref 0–3)

## 2020-09-15 LAB — TSH: TSH: 1.48 u[IU]/mL (ref 0.450–4.500)

## 2020-09-15 NOTE — Progress Notes (Signed)
Verbal consent for services obtained from patient prior to services given to TELEPHONE visit:   Location of call:  provider at work patient at home  Names of all persons present for services: Mable Paris, NP and patient Chief complaint:  More severe 'acid reflux' episodes, since improved.   Compliant with aciphex 20mg  qam prior to breakfast and pepcid 40mg  qpm which medications are helpful. Had been taking aciphex with coffee which she determined was making worse. She has since stopped coffee altogether with improvement of symptoms.   Normally medications are helpful however if lays flat or on right side, has severe epigastric reflux.   The reason which prompted her call was last week she had episode of epigastric burning and regurgitation which 'burned throat so bad'. After a couple of hours with throat lozenge, throat pain resolved however then developed a cough.   Since last week , epigastric burning has improved however intermittent cough with scant phlegm persists.  Tried protonix, prilosec in the past without relief. Aciphex as been the most helpful.  Endorses choking with liquids.   H/o esophageal stricture ,  Hiatal hernia.   Alternating loose to formed brown stools remain despite eliminating artificial sugar.No blood in stool.  Has a bowel movement after she eats. No fecal incontinence.   Visit with Octavia Bruckner was rescheduled to new year.   She has stopped taking Cymbalta as afraid making acid reflux worse.   Following with pain management- compliant with tramadol whom Dr Dossie Arbour prescribes  HTN- compliant with losartan 50mg  , hctz 25mg . She is not taking potassium chloride as she was concerned if may epigastric worse . No cp, sob.   She has been working weight loss and eating less fast food.     BP Readings from Last 3 Encounters:  08/23/20 133/82  07/27/20 (!) 147/80  07/18/20 119/89   Wt Readings from Last 3 Encounters:  09/15/20 284 lb (128.8 kg)  08/23/20  287 lb (130.2 kg)  08/21/20 287 lb (130.2 kg)     A/P/next steps:  Problem List Items Addressed This Visit      Cardiovascular and Mediastinum   Essential hypertension    Elevated. Strongly advised patient to take BP log at home so we can make adjustments. Continue losartan 25mg  and hctz 25mg . Education provided on risks of hypokalemia if she continues to hold potassium chloride. For safety, advised to resume potassium chloride 10 med qod as unlikely to effect gerd.         Digestive   GERD (gastroesophageal reflux disease)    Exacerbated, since improved when eliminated coffee. Concerned that she may have esophageal stricture complicating presentation. Hiatal hernia, obesity may also be exacerbated. Since no severe episode recurrence, we opted not to change medication regimen today. She will continue aciphex 20mg  qam and pepcid 40mg  qpm. We will work on getting patient an appointment with [facility] sooner as she may need EGD.          Other   Loose stools    Unchanged. Intermittent. Working on earlier appointment with GI.           I spent 21 min  discussing plan of care over the phone.

## 2020-09-18 DIAGNOSIS — M545 Low back pain, unspecified: Secondary | ICD-10-CM | POA: Insufficient documentation

## 2020-09-18 DIAGNOSIS — M47817 Spondylosis without myelopathy or radiculopathy, lumbosacral region: Secondary | ICD-10-CM | POA: Insufficient documentation

## 2020-09-18 DIAGNOSIS — G8929 Other chronic pain: Secondary | ICD-10-CM | POA: Insufficient documentation

## 2020-09-18 NOTE — Progress Notes (Signed)
PROVIDER NOTE: Information contained herein reflects review and annotations entered in association with encounter. Interpretation of such information and data should be left to medically-trained personnel. Information provided to patient can be located elsewhere in the medical record under "Patient Instructions". Document created using STT-dictation technology, any transcriptional errors that may result from process are unintentional.    Patient: Jacqueline Delgado  Service Category: Procedure  Provider: Gaspar Cola, MD  DOB: 28-Feb-1951  DOS: 09/19/2020  Location: [facility]  MRN: [id number]  Setting: Ambulatory - outpatient  Referring Provider: No ref. provider found  Type: Established Patient  Specialty: Interventional Pain Management  PCP: Burnard Hawthorne, FNP   Primary Reason for Visit: Interventional Pain Management Treatment. CC: Knee Pain (left)  Procedure:          Anesthesia, Analgesia, Anxiolysis:  Type: Lumbar Facet, Medial Branch Block(s) #1  Primary Purpose: Diagnostic Region: Posterolateral Lumbosacral Spine Level: L2, L3, L4, L5, & S1 Medial Branch Level(s). Injecting these levels blocks the L3-4, L4-5, and L5-S1 lumbar facet joints. Laterality: Bilateral  Type: Moderate (Conscious) Sedation combined with Local Anesthesia Indication(s): Analgesia and Anxiety Route: Intravenous (IV) IV Access: Secured Sedation: Meaningful verbal contact was maintained at all times during the procedure  Local Anesthetic: Lidocaine 1-2%  Position: Prone   Indications: 1. Lumbosacral facet syndrome (Bilateral) (R>L)   2. Lumbar facet hypertrophy   3. Spondylosis without myelopathy or radiculopathy, lumbosacral region   4. Lumbar Grade 1 Anterolisthesis of L4/L5   5. DDD (degenerative disc disease), lumbosacral   6. Chronic low back pain (Bilateral) (R>L) w/o sciatica   7. Chronic knee pain (3ry area of Pain) (Left)   8. Osteoarthritis of knee (Left)   9. Chronic low  back pain (1ry area of Pain) (Bilateral) (R>L) w/ sciatica (Right)    Pain Score: Pre-procedure: 10-Worst pain ever/10 Post-procedure: 2  (right leg when getting up)/10   Pre-op H&P Assessment:  Ms. Plate is a 69 y.o. (year old), female patient, seen today for interventional treatment. She  has a past surgical history that includes Joint replacement (Right); Joint replacement; Cholecystectomy; Abdominal hysterectomy; Shoulder arthroscopy with rotator cuff repair and subacromial decompression (Right, 06/14/2015); Esophagogastroduodenoscopy (egd) with propofol (N/A, 10/11/2015); Savory dilation (N/A, 10/11/2015); Colonoscopy w/ polypectomy; Esophagogastroduodenoscopy (egd) with propofol (N/A, 12/10/2017); and Colonoscopy with propofol (N/A, 12/10/2017). [name] has a current medication list which includes the following prescription(s): aciphex, famotidine, losartan, potassium chloride, tramadol, vitamin d3, and hydrochlorothiazide, and the following Facility-Administered Medications: fentanyl and midazolam. Her primarily concern today is the Knee Pain (left)  Initial Vital Signs:  Pulse/HCG Rate: 94ECG Heart Rate: (!) 101 Temp: (!) 97 F (36.1 C) Resp: 18 BP: (!) 117/106 SpO2: 99 %  BMI: Estimated body mass index is 45.84 kg/m as calculated from the following:   Height as of this encounter: 5\' 6"  (1.676 m).   Weight as of this encounter: 284 lb (128.8 kg).  Risk Assessment: Allergies: Reviewed. She has No Known Allergies.  Allergy Precautions: None required Coagulopathies: Reviewed. None identified.  Blood-thinner therapy: None at this time Active Infection(s): Reviewed. None identified. [name] is afebrile  Site Confirmation: [name] was asked to confirm the procedure and laterality before marking the site Procedure checklist: Completed Consent: Before the procedure and under the influence of no sedative(s), amnesic(s), or anxiolytics, the patient was informed of the treatment  options, risks and possible complications. To fulfill our ethical and legal obligations, as recommended by the American Medical Association's Code of  Ethics, I have informed the patient of my clinical impression; the nature and purpose of the treatment or procedure; the risks, benefits, and possible complications of the intervention; the alternatives, including doing nothing; the risk(s) and benefit(s) of the alternative treatment(s) or procedure(s); and the risk(s) and benefit(s) of doing nothing. The patient was provided information about the general risks and possible complications associated with the procedure. These may include, but are not limited to: failure to achieve desired goals, infection, bleeding, organ or nerve damage, allergic reactions, paralysis, and death. In addition, the patient was informed of those risks and complications associated to Spine-related procedures, such as failure to decrease pain; infection (i.e.: Meningitis, epidural or intraspinal abscess); bleeding (i.e.: epidural hematoma, subarachnoid hemorrhage, or any other type of intraspinal or peri-dural bleeding); organ or nerve damage (i.e.: Any type of peripheral nerve, nerve root, or spinal cord injury) with subsequent damage to sensory, motor, and/or autonomic systems, resulting in permanent pain, numbness, and/or weakness of one or several areas of the body; allergic reactions; (i.e.: anaphylactic reaction); and/or death. Furthermore, the patient was informed of those risks and complications associated with the medications. These include, but are not limited to: allergic reactions (i.e.: anaphylactic or anaphylactoid reaction(s)); adrenal axis suppression; blood sugar elevation that in diabetics may result in ketoacidosis or comma; water retention that in patients with history of congestive heart failure may result in shortness of breath, pulmonary edema, and decompensation with resultant heart failure; weight gain; swelling or  edema; medication-induced neural toxicity; particulate matter embolism and blood vessel occlusion with resultant organ, and/or nervous system infarction; and/or aseptic necrosis of one or more joints. Finally, the patient was informed that Medicine is not an exact science; therefore, there is also the possibility of unforeseen or unpredictable risks and/or possible complications that may result in a catastrophic outcome. The patient indicated having understood very clearly. We have given the patient no guarantees and we have made no promises. Enough time was given to the patient to ask questions, all of which were answered to the patient's satisfaction. Ms. Mericle has indicated that she wanted to continue with the procedure. Attestation: I, the ordering provider, attest that I have discussed with the patient the benefits, risks, side-effects, alternatives, likelihood of achieving goals, and potential problems during recovery for the procedure that I have provided informed consent. Date  Time: 09/19/2020  9:22 AM  Pre-Procedure Preparation:  Monitoring: As per clinic protocol. Respiration, ETCO2, SpO2, BP, heart rate and rhythm monitor placed and checked for adequate function Safety Precautions: Patient was assessed for positional comfort and pressure points before starting the procedure. Time-out: I initiated and conducted the "Time-out" before starting the procedure, as per protocol. The patient was asked to participate by confirming the accuracy of the "Time Out" information. Verification of the correct person, site, and procedure were performed and confirmed by me, the nursing staff, and the patient. "Time-out" conducted as per Joint Commission's Universal Protocol (UP.01.01.01). Time: 1040  Description of Procedure:          Laterality: Bilateral. The procedure was performed in identical fashion on both sides. Levels:  L2, L3, L4, L5, & S1 Medial Branch Level(s) Area Prepped: Posterior Lumbosacral  Region DuraPrep (Iodine Povacrylex [0.7% available iodine] and Isopropyl Alcohol, 74% w/w) Safety Precautions: Aspiration looking for blood return was conducted prior to all injections. At no point did we inject any substances, as a needle was being advanced. Before injecting, the patient was told to immediately notify me if she was experiencing  any new onset of "ringing in the ears, or metallic taste in the mouth". No attempts were made at seeking any paresthesias. Safe injection practices and needle disposal techniques used. Medications properly checked for expiration dates. SDV (single dose vial) medications used. After the completion of the procedure, all disposable equipment used was discarded in the proper designated medical waste containers. Local Anesthesia: Protocol guidelines were followed. The patient was positioned over the fluoroscopy table. The area was prepped in the usual manner. The time-out was completed. The target area was identified using fluoroscopy. A 12-in long, straight, sterile hemostat was used with fluoroscopic guidance to locate the targets for each level blocked. Once located, the skin was marked with an approved surgical skin marker. Once all sites were marked, the skin (epidermis, dermis, and hypodermis), as well as deeper tissues (fat, connective tissue and muscle) were infiltrated with a small amount of a short-acting local anesthetic, loaded on a 10cc syringe with a 25G, 1.5-in  Needle. An appropriate amount of time was allowed for local anesthetics to take effect before proceeding to the next step. Local Anesthetic: Lidocaine 2.0% The unused portion of the local anesthetic was discarded in the proper designated containers. Technical explanation of process:  L2 Medial Branch Nerve Block (MBB): The target area for the L2 medial branch is at the junction of the postero-lateral aspect of the superior articular process and the superior, posterior, and medial edge of the  transverse process of L3. Under fluoroscopic guidance, a Quincke needle was inserted until contact was made with os over the superior postero-lateral aspect of the pedicular shadow (target area). After negative aspiration for blood, 0.5 mL of the nerve block solution was injected without difficulty or complication. The needle was removed intact. L3 Medial Branch Nerve Block (MBB): The target area for the L3 medial branch is at the junction of the postero-lateral aspect of the superior articular process and the superior, posterior, and medial edge of the transverse process of L4. Under fluoroscopic guidance, a Quincke needle was inserted until contact was made with os over the superior postero-lateral aspect of the pedicular shadow (target area). After negative aspiration for blood, 0.5 mL of the nerve block solution was injected without difficulty or complication. The needle was removed intact. L4 Medial Branch Nerve Block (MBB): The target area for the L4 medial branch is at the junction of the postero-lateral aspect of the superior articular process and the superior, posterior, and medial edge of the transverse process of L5. Under fluoroscopic guidance, a Quincke needle was inserted until contact was made with os over the superior postero-lateral aspect of the pedicular shadow (target area). After negative aspiration for blood, 0.5 mL of the nerve block solution was injected without difficulty or complication. The needle was removed intact. L5 Medial Branch Nerve Block (MBB): The target area for the L5 medial branch is at the junction of the postero-lateral aspect of the superior articular process and the superior, posterior, and medial edge of the sacral ala. Under fluoroscopic guidance, a Quincke needle was inserted until contact was made with os over the superior postero-lateral aspect of the pedicular shadow (target area). After negative aspiration for blood, 0.5 mL of the nerve block solution was injected  without difficulty or complication. The needle was removed intact. S1 Medial Branch Nerve Block (MBB): The target area for the S1 medial branch is at the posterior and inferior 6 o'clock position of the L5-S1 facet joint. Under fluoroscopic guidance, the Quincke needle inserted for the L5  MBB was redirected until contact was made with os over the inferior and postero aspect of the sacrum, at the 6 o' clock position under the L5-S1 facet joint (Target area). After negative aspiration for blood, 0.5 mL of the nerve block solution was injected without difficulty or complication. The needle was removed intact.  Nerve block solution: 0.2% PF-Ropivacaine + Triamcinolone (40 mg/mL) diluted to a final concentration of 4 mg of Triamcinolone/mL of Ropivacaine The unused portion of the solution was discarded in the proper designated containers. Procedural Needles: 22-gauge, 7-inch, Quincke needles used for all levels.  Once the entire procedure was completed, the treated area was cleaned, making sure to leave some of the prepping solution back to take advantage of its long term bactericidal properties.   Illustration of the posterior view of the lumbar spine and the posterior neural structures. Laminae of L2 through S1 are labeled. DPRL5, dorsal primary ramus of L5; DPRS1, dorsal primary ramus of S1; DPR3, dorsal primary ramus of L3; FJ, facet (zygapophyseal) joint L3-L4; I, inferior articular process of L4; LB1, lateral branch of dorsal primary ramus of L1; IAB, inferior articular branches from L3 medial branch (supplies L4-L5 facet joint); IBP, intermediate branch plexus; MB3, medial branch of dorsal primary ramus of L3; NR3, third lumbar nerve root; S, superior articular process of L5; SAB, superior articular branches from L4 (supplies L4-5 facet joint also); TP3, transverse process of L3.  Vitals:   09/19/20 1055 09/19/20 1100 09/19/20 1110 09/19/20 1121  BP: (!) 142/84 121/85 120/87 121/80  Pulse:        Resp: (!) 21 16 16 16   Temp:  (!) 97.3 F (36.3 C)  (!) 97.3 F (36.3 C)  TempSrc:      SpO2: 97% 96% 96% 100%  Weight:    284 lb (128.8 kg)  Height:    5\' 6"  (1.676 m)     Start Time: 1040 hrs. End Time: 1054 hrs.  Imaging Guidance (Spinal):          Type of Imaging Technique: Fluoroscopy Guidance (Spinal) Indication(s): Assistance in needle guidance and placement for procedures requiring needle placement in or near specific anatomical locations not easily accessible without such assistance. Exposure Time: Please see nurses notes. Contrast: None used. Fluoroscopic Guidance: I was personally present during the use of fluoroscopy. "Tunnel Vision Technique" used to obtain the best possible view of the target area. Parallax error corrected before commencing the procedure. "Direction-depth-direction" technique used to introduce the needle under continuous pulsed fluoroscopy. Once target was reached, antero-posterior, oblique, and lateral fluoroscopic projection used confirm needle placement in all planes. Images permanently stored in EMR. Interpretation: No contrast injected. I personally interpreted the imaging intraoperatively. Adequate needle placement confirmed in multiple planes. Permanent images saved into the patient's record.  Antibiotic Prophylaxis:   Anti-infectives (From admission, onward)   None     Indication(s): None identified  Post-operative Assessment:  Post-procedure Vital Signs:  Pulse/HCG Rate: 9494 Temp: (!) 97.3 F (36.3 C) Resp: 16 BP: 121/80 SpO2: 100 %  EBL: None  Complications: No immediate post-treatment complications observed by team, or reported by patient.  Note: The patient tolerated the entire procedure well. A repeat set of vitals were taken after the procedure and the patient was kept under observation following institutional policy, for this type of procedure. Post-procedural neurological assessment was performed, showing return to baseline,  prior to discharge. The patient was provided with post-procedure discharge instructions, including a section on how to identify potential problems. Should any  problems arise concerning this procedure, the patient was given instructions to immediately contact us, at any time, without hesitation. In any case, we plan to contact the patient by telephone for a follow-up status report regarding this interventional procedure.  Comments:  No additional relevant information.  Plan of Care  Orders:  Orders Placed This Encounter  Procedures  . LUMBAR FACET(MEDIAL BRANCH NERVE BLOCK) MBNB    Scheduling Instructions:     Procedure: Lumbar facet block (AKA.: Lumbosacral medial branch nerve block)     Side: Bilateral     Level: L3-4, L4-5, & L5-S1 Facets (L2, L3, L4, L5, & S1 Medial Branch Nerves)     Sedation: Patient's choice.     Timeframe: Today    Order Specific Question:   Where will this procedure be performed?    Answer:   [facility]  . [facility] C-ARM 1-60 MIN NO REPORT    Intraoperative interpretation by procedural physician at [place].    Standing Status:   Standing    Number of Occurrences:   1    Order Specific Question:   Reason for exam:    Answer:   Assistance in needle guidance and placement for procedures requiring needle placement in or near specific anatomical locations not easily accessible without such assistance.  . Provide equipment / supplies at bedside    "Block Tray" (Disposable  single use) Needle type: SpinalSpinal Amount/quantity: 1 Size: Regular (3.5-inch) Gauge: 22G    Standing Status:   Standing    Number of Occurrences:   1    Order Specific Question:   Specify    Answer:   Block Tray  . Informed Consent Details: Physician/Practitioner Attestation; Transcribe to consent form and obtain patient signature    Nursing Order: Transcribe to consent form and obtain patient signature. Note: Always confirm laterality of pain with Ms.  Sturgill, before procedure.    Order Specific Question:   Physician/Practitioner attestation of informed consent for procedure/surgical case    Answer:   I, the physician/practitioner, attest that I have discussed with the patient the benefits, risks, side effects, alternatives, likelihood of achieving goals and potential problems during recovery for the procedure that I have provided informed consent.    Order Specific Question:   Procedure    Answer:   Lumbar Facet Block  under fluoroscopic guidance    Order Specific Question:   Physician/Practitioner performing the procedure    Answer:   Anacleto Batterman A. Dossie Arbour MD    Order Specific Question:   Indication/Reason    Answer:   Low Back Pain, with our without leg pain, due to Facet Joint Arthralgia (Joint Pain) Spondylosis (Arthritis of the Spine), without myelopathy or radiculopathy (Nerve Damage).  . Provide equipment / supplies at bedside    "Block Tray" (Disposable  single use) Needle type: SpinalSpinal Amount/quantity: 4 Size: Long (7-inch) Gauge: 22G    Standing Status:   Standing    Number of Occurrences:   1    Order Specific Question:   Specify    Answer:   Block Tray   Chronic Opioid Analgesic:  Hydrocodone/homatropine solution #120 mL, 5 mL 4 times daily (20 MME/day) MME/day: 20 mg/day   Medications ordered for procedure: Meds ordered this encounter  Medications  . lidocaine (XYLOCAINE) 2 % (with pres) injection 400 mg  . lactated ringers infusion 1,000 mL  . midazolam (VERSED) 5 MG/5ML injection 1-2 mg    Make sure Flumazenil is available in the pyxis  when using this medication. If oversedation occurs, administer 0.2 mg IV over 15 sec. If after 45 sec no response, administer 0.2 mg again over 1 min; may repeat at 1 min intervals; not to exceed 4 doses (1 mg)  . fentaNYL (SUBLIMAZE) injection 25-50 mcg    Make sure Narcan is available in the pyxis when using this medication. In the event of respiratory depression (RR< 8/min):  Titrate NARCAN (naloxone) in increments of 0.1 to 0.2 mg IV at 2-3 minute intervals, until desired degree of reversal.  . ropivacaine (PF) 2 mg/mL (0.2%) (NAROPIN) injection 18 mL  . triamcinolone acetonide (KENALOG-40) injection 80 mg   Medications administered: We administered lidocaine, lactated ringers, midazolam, fentaNYL, ropivacaine (PF) 2 mg/mL (0.2%), and triamcinolone acetonide.  See the medical record for exact dosing, route, and time of administration.  Follow-up plan:   Return in about 2 weeks (around 10/03/2020) for (F2F), (PP) Follow-up.       Interventional management options: Planned, scheduled, and/or pending:       Considering:   Diagnostic midline L4-5 LESI #1  Diagnostic bilateral lumbar facet block #1    PRN Procedures:   None at this time    Recent Visits Date Type Provider Dept  08/23/20 Office Visit Milinda Pointer, MD [facility]  07/27/20 Procedure visit Milinda Pointer, MD [facility]  07/18/20 Office Visit Milinda Pointer, MD [facility]  Showing recent visits within past 90 days and meeting all other requirements Today's Visits Date Type Provider Dept  09/19/20 Procedure visit Milinda Pointer, MD [facility]  Showing today's visits and meeting all other requirements Future Appointments Date Type Provider Dept  10/04/20 Appointment Milinda Pointer, MD [facility]  Showing future appointments within next 90 days and meeting all other requirements  Disposition: Discharge home  Discharge (Date  Time): 09/19/2020; 1130 hrs.   Primary Care Physician: Burnard Hawthorne, FNP Location: [facility] Note by: Gaspar Cola, MD Date: 09/19/2020; Time: 1:17 PM  Disclaimer:  Medicine is not an Chief Strategy Officer. The only guarantee in medicine is that nothing is guaranteed. It is important to note that the decision to proceed with this intervention was based on  the information collected from the patient. The Data and conclusions were drawn from the patient's questionnaire, the interview, and the physical examination. Because the information was provided in large part by the patient, it cannot be guaranteed that it has not been purposely or unconsciously manipulated. Every effort has been made to obtain as much relevant data as possible for this evaluation. It is important to note that the conclusions that lead to this procedure are derived in large part from the available data. Always take into account that the treatment will also be dependent on availability of resources and existing treatment guidelines, considered by other Pain Management Practitioners as being common knowledge and practice, at the time of the intervention. For Medico-Legal purposes, it is also important to point out that variation in procedural techniques and pharmacological choices are the acceptable norm. The indications, contraindications, technique, and results of the above procedure should only be interpreted and judged by a Board-Certified Interventional Pain Specialist with extensive familiarity and expertise in the same exact procedure and technique.

## 2020-09-18 NOTE — Assessment & Plan Note (Signed)
Unchanged. Intermittent. Working on earlier appointment with GI.

## 2020-09-18 NOTE — Progress Notes (Signed)
I called to let patient know that appointment had been moved up to 12/14 @ 10:30.

## 2020-09-18 NOTE — Assessment & Plan Note (Signed)
Elevated. Strongly advised patient to take BP log at home so we can make adjustments. Continue losartan 25mg  and hctz 25mg . Education provided on risks of hypokalemia if she continues to hold potassium chloride. For safety, advised to resume potassium chloride 10 med qod as unlikely to effect gerd.

## 2020-09-18 NOTE — Assessment & Plan Note (Signed)
Exacerbated, since improved when eliminated coffee. Concerned that she may have esophageal stricture complicating presentation. Hiatal hernia, obesity may also be exacerbated. Since no severe episode recurrence, we opted not to change medication regimen today. She will continue aciphex 20mg  qam and pepcid 40mg  qpm. We will work on getting patient an appointment with [facility] sooner as she may need EGD.

## 2020-09-19 ENCOUNTER — Encounter: Payer: Self-pay | Admitting: Pain Medicine

## 2020-09-19 ENCOUNTER — Ambulatory Visit (HOSPITAL_BASED_OUTPATIENT_CLINIC_OR_DEPARTMENT_OTHER): Payer: Medicare HMO | Admitting: Pain Medicine

## 2020-09-19 ENCOUNTER — Other Ambulatory Visit: Payer: Self-pay

## 2020-09-19 ENCOUNTER — Ambulatory Visit
Admission: RE | Admit: 2020-09-19 | Discharge: 2020-09-19 | Disposition: A | Discharge status: Home / Self Care | Payer: Medicare HMO | Source: Ambulatory Visit | Attending: Pain Medicine | Admitting: Pain Medicine

## 2020-09-19 VITALS — BP 121/80 | HR 94 | Temp 97.3°F | Resp 16 | Ht 66.0 in | Wt 284.0 lb

## 2020-09-19 DIAGNOSIS — M47816 Spondylosis without myelopathy or radiculopathy, lumbar region: Secondary | ICD-10-CM

## 2020-09-19 DIAGNOSIS — G8929 Other chronic pain: Secondary | ICD-10-CM

## 2020-09-19 DIAGNOSIS — M5441 Lumbago with sciatica, right side: Secondary | ICD-10-CM | POA: Insufficient documentation

## 2020-09-19 DIAGNOSIS — M25562 Pain in left knee: Secondary | ICD-10-CM | POA: Diagnosis present

## 2020-09-19 DIAGNOSIS — M47817 Spondylosis without myelopathy or radiculopathy, lumbosacral region: Secondary | ICD-10-CM | POA: Diagnosis not present

## 2020-09-19 DIAGNOSIS — M5137 Other intervertebral disc degeneration, lumbosacral region: Secondary | ICD-10-CM

## 2020-09-19 DIAGNOSIS — M4310 Spondylolisthesis, site unspecified: Secondary | ICD-10-CM | POA: Diagnosis present

## 2020-09-19 DIAGNOSIS — M1712 Unilateral primary osteoarthritis, left knee: Secondary | ICD-10-CM

## 2020-09-19 DIAGNOSIS — M545 Low back pain, unspecified: Secondary | ICD-10-CM | POA: Diagnosis present

## 2020-09-19 MED ORDER — LIDOCAINE HCL 2 % IJ SOLN
20.0000 mL | Freq: Once | INTRAMUSCULAR | Status: AC
  Administered 2020-09-19: 400 mg

## 2020-09-19 MED ORDER — LACTATED RINGERS IV SOLN
1000.0000 mL | Freq: Once | INTRAVENOUS | Status: AC
  Administered 2020-09-19: 1000 mL via INTRAVENOUS

## 2020-09-19 MED ORDER — ROPIVACAINE HCL 2 MG/ML IJ SOLN
18.0000 mL | Freq: Once | INTRAMUSCULAR | Status: AC
  Administered 2020-09-19: 18 mL via PERINEURAL
  Filled 2020-09-19: qty 20

## 2020-09-19 MED ORDER — LIDOCAINE HCL (PF) 2 % IJ SOLN
INTRAMUSCULAR | Status: AC
  Filled 2020-09-19: qty 20

## 2020-09-19 MED ORDER — TRIAMCINOLONE ACETONIDE 40 MG/ML IJ SUSP
80.0000 mg | Freq: Once | INTRAMUSCULAR | Status: AC
  Administered 2020-09-19: 80 mg
  Filled 2020-09-19: qty 2

## 2020-09-19 MED ORDER — FENTANYL CITRATE (PF) 100 MCG/2ML IJ SOLN
25.0000 ug | INTRAMUSCULAR | Status: DC | PRN
  Administered 2020-09-19: 50 ug via INTRAVENOUS
  Filled 2020-09-19: qty 2

## 2020-09-19 MED ORDER — MIDAZOLAM HCL 5 MG/5ML IJ SOLN
1.0000 mg | INTRAMUSCULAR | Status: DC | PRN
  Administered 2020-09-19: 2 mg via INTRAVENOUS
  Filled 2020-09-19: qty 5

## 2020-09-19 NOTE — Patient Instructions (Signed)

## 2020-09-20 ENCOUNTER — Telehealth: Payer: Self-pay

## 2020-09-20 NOTE — Telephone Encounter (Signed)
Called PP denies any needs at this time. Instructed to call if needed. 

## 2020-09-21 DIAGNOSIS — M1712 Unilateral primary osteoarthritis, left knee: Secondary | ICD-10-CM | POA: Diagnosis not present

## 2020-09-26 ENCOUNTER — Other Ambulatory Visit: Payer: Self-pay | Admitting: Gastroenterology

## 2020-09-26 DIAGNOSIS — R110 Nausea: Secondary | ICD-10-CM | POA: Diagnosis not present

## 2020-09-26 DIAGNOSIS — Z6841 Body Mass Index (BMI) 40.0 and over, adult: Secondary | ICD-10-CM | POA: Diagnosis not present

## 2020-09-26 DIAGNOSIS — K219 Gastro-esophageal reflux disease without esophagitis: Secondary | ICD-10-CM

## 2020-09-26 DIAGNOSIS — R053 Chronic cough: Secondary | ICD-10-CM

## 2020-09-26 DIAGNOSIS — R1010 Upper abdominal pain, unspecified: Secondary | ICD-10-CM

## 2020-09-26 DIAGNOSIS — K449 Diaphragmatic hernia without obstruction or gangrene: Secondary | ICD-10-CM | POA: Diagnosis not present

## 2020-09-26 DIAGNOSIS — R1314 Dysphagia, pharyngoesophageal phase: Secondary | ICD-10-CM

## 2020-09-26 DIAGNOSIS — K529 Noninfective gastroenteritis and colitis, unspecified: Secondary | ICD-10-CM | POA: Diagnosis not present

## 2020-10-01 NOTE — Progress Notes (Deleted)
No show

## 2020-10-02 DIAGNOSIS — M1712 Unilateral primary osteoarthritis, left knee: Secondary | ICD-10-CM | POA: Diagnosis not present

## 2020-10-04 ENCOUNTER — Ambulatory Visit: Payer: Medicare HMO | Admitting: Pain Medicine

## 2020-10-04 DIAGNOSIS — F1120 Opioid dependence, uncomplicated: Secondary | ICD-10-CM | POA: Insufficient documentation

## 2020-10-09 ENCOUNTER — Other Ambulatory Visit: Payer: Self-pay | Admitting: Family

## 2020-10-09 ENCOUNTER — Telehealth: Payer: Self-pay

## 2020-10-09 DIAGNOSIS — K219 Gastro-esophageal reflux disease without esophagitis: Secondary | ICD-10-CM

## 2020-10-09 MED ORDER — RABEPRAZOLE SODIUM 20 MG PO TBEC: DELAYED_RELEASE_TABLET | ORAL | 2 refills | Status: DC

## 2020-10-09 NOTE — Telephone Encounter (Signed)
Call pt I reviewed Jacqueline Delgado's note from this month She may take one tablet of 20mg  aciphex once to twice daily; I have sent in new rx Please advise that she continue to follow with him regarding this dose and if/when she weans off such a high dose. I do not advise to be on a high dose PPI ( Aciphex) for long term due to risks of medication

## 2020-10-09 NOTE — Telephone Encounter (Signed)
Dr. Marlene Bast wants pt to up ACIPHEX 20 MG tablet to take 2 tablets a day instead of once a day. Please advise. [facility] [address]

## 2020-10-09 NOTE — Telephone Encounter (Signed)
Ok to change rx?

## 2020-10-10 NOTE — Telephone Encounter (Signed)
I called and let patient know that prescription was resent. [facility] make [facility] was [facility] as name brand since that is all she can take. Pt wanted me to let you know she is drinking around 48oz of only water a day. She has cut out any other beverages. She said that her urine is still yellow in color & sometimes has a strong odor. She said that she was going to call urology to make f/u since she was supposed to, but never did.

## 2020-10-17 ENCOUNTER — Telehealth: Payer: Self-pay

## 2020-10-17 ENCOUNTER — Other Ambulatory Visit: Payer: Self-pay

## 2020-10-17 MED ORDER — ACIPHEX 20 MG PO TBEC: 20.0000 mg | DELAYED_RELEASE_TABLET | Freq: Two times a day (BID) | ORAL | 5 refills | Status: DC

## 2020-10-17 NOTE — Telephone Encounter (Signed)
LMTCB

## 2020-10-17 NOTE — Telephone Encounter (Signed)
Pt called to speak with you about her prescription. She states that she spoke with insurance company and the rx needs to be rewritten. Please advise

## 2020-10-17 NOTE — Telephone Encounter (Signed)
Pt called needing help with getting brand name Aciphex approved to control her GERD. [facility] & insurance only wants to pay for generic. I have submitted urgent PA for brand name through cover my meds & awaiting response. I have told patient that I will keep checking back today to see if [facility] has given response.

## 2020-10-17 NOTE — Telephone Encounter (Signed)
I spoke with patient & per insurance I have sent in DAW. When [facility] to make sure [facility] was [facility] specifically to be filled for brand name pharmacist stated they still couldn't fill bc she had transferred to [facility]. [facility] has filled & her son had picked up, but again was wrong & she was given generic. Since she has that medication the fill date without insurance approval is 2/6. [facility] needs an override from [facility] to fill earlier. Pt is calling [employer] to see if she can have them overrule this decision.

## 2020-10-20 ENCOUNTER — Ambulatory Visit: Payer: Medicare Other

## 2020-10-22 NOTE — Progress Notes (Signed)
PROVIDER NOTE: Information contained herein reflects review and annotations entered in association with encounter. Interpretation of such information and data should be left to medically-trained personnel. Information provided to patient can be located elsewhere in the medical record under "Patient Instructions". Document created using STT-dictation technology, any transcriptional errors that may result from process are unintentional.    Patient: Jacqueline Delgado  Service Category: E/M  Provider: Gaspar Cola, MD  DOB: 02-28-1951  DOS: 10/23/2020  Specialty: Interventional Pain Management  MRN: [id number]  Setting: Ambulatory outpatient  PCP: Burnard Hawthorne, FNP  Type: Established Patient    Referring Provider: Burnard Hawthorne, FNP  Location: Office  Delivery: Face-to-face     HPI  Jacqueline Delgado, a 70 y.o. year old female, is here today because of her Chronic pain syndrome [G89.4]. Jacqueline Delgado primary complain today is Back Pain (Lower back) Last encounter: My last encounter with her was on 10/04/2020. Pertinent problems: Jacqueline Delgado has Plantar fasciitis; Localized swelling of finger of left hand; Acquired trigger finger; Closed nondisplaced fracture of base of fifth metacarpal bone of right hand; Complete tear of right rotator cuff; DDD (degenerative disc disease), cervical; DDD (degenerative disc disease), lumbar; Chronic low back pain (1ry area of Pain) (Bilateral) (R>L) w/ sciatica (Right); Neck pain; Non-cardiac chest pain; S/P rotator cuff repair; Osteoarthritis, knee; Unspecified osteoarthritis, unspecified site; Osteoarthritis of knee (Left); Ankle swelling, right; Chronic pain syndrome; DDD (degenerative disc disease), lumbosacral; Lumbar facet hypertrophy; Abnormal MRI, lumbar spine (04/12/2013); Lumbosacral central spinal stenosis (9 mm) (L4-5 and L5-S1); Chronic lower extremity pain (Right); Lumbosacral radiculopathy at L5 (Right); Chronic knee pain (3ry area of Pain) (Left);  Swelling of foot (4th area of Pain) (Right); Chronic lower extremity pain (2ry area of Pain) (Bilateral) (R>L); Calcaneal spur of foot (Right); Lumbar Grade 1 Anterolisthesis of L4/L5; Lumbosacral facet syndrome (Bilateral) (R>L); Chronic low back pain (Bilateral) (R>L) w/o sciatica; and Spondylosis without myelopathy or radiculopathy, lumbosacral region on their pertinent problem list. Pain Assessment: Severity of Chronic pain is reported as a 5 /10. Location: Back Left,Right/Denies. Onset: More than a month ago. Quality: Aching. Timing: Intermittent. Modifying factor(s): heating pad and meds. Vitals:  height is '5\' 3"'  (1.6 m) and weight is 270 lb (122.5 kg). Her temperature is 97.5 F (36.4 C) (abnormal). Her blood pressure is 137/79 and her pulse is 64. Her oxygen saturation is 100%.   Reason for encounter: post-procedure assessment.  The patient attained 100% relief of the pain for the duration of the local anesthetic which persisted for an additional 4 weeks.  The patient was really happy about those results.  She still not getting the pain back to where it was.  She indicates that she can do okay with the way that she feels for now.  In view of this, we have decided to schedule the second lumbar facet block as a PRN.  Post-Procedure Evaluation  Procedure (10/04/2020): Diagnostic bilateral lumbar facet MBB #1 under fluoroscopic guidance and IV sedation Pre-procedure pain level: 10/10 Post-procedure: 2/10 (> 50% relief)  Sedation: Sedation provided.  Effectiveness during initial hour after procedure(Ultra-Short Term Relief): 100 %.  Local anesthetic used: Long-acting (4-6 hours) Effectiveness: Defined as any analgesic benefit obtained secondary to the administration of local anesthetics. This carries significant diagnostic value as to the etiological location, or anatomical origin, of the pain. Duration of benefit is expected to coincide with the duration of the local anesthetic used.   Effectiveness during initial 4-6 hours after procedure(Short-Term Relief): 100 %.  Long-term benefit: Defined as any relief past the pharmacologic duration of the local anesthetics.  Effectiveness past the initial 6 hours after procedure(Long-Term Relief): 100 % (lasted for 4 weeks or more).  Current benefits: Defined as benefit that persist at this time.   Analgesia:  >75% ongoing relief of the lower back pain Function: [name] Delgado improvement in function ROM: [name] improvement in ROM  Pharmacotherapy Assessment   Analgesic: Hydrocodone/homatropine solution #120 mL, 5 mL 4 times daily (20 MME/day) MME/day: 20 mg/day   Monitoring: [place] PMP: PDMP reviewed during this encounter.       Pharmacotherapy: No side-effects or adverse reactions reported. Compliance: No problems identified. Effectiveness: Clinically acceptable.  No notes on file  UDS:  Summary  Date Value Ref Range Status  05/22/2020 Note  Final    Comment:    ==================================================================== Compliance Drug Analysis, Ur ==================================================================== Test                             Result       Flag       Units  Drug Present and Declared for Prescription Verification   Tramadol                       >3268        EXPECTED   ng/mg creat   O-Desmethyltramadol            2372         EXPECTED   ng/mg creat   N-Desmethyltramadol            1509         EXPECTED   ng/mg creat    Source of tramadol is a prescription medication. O-desmethyltramadol    and N-desmethyltramadol are expected metabolites of tramadol.    Bupropion                      PRESENT      EXPECTED   Hydroxybupropion               PRESENT      EXPECTED    Hydroxybupropion is an expected metabolite of bupropion.  Drug Present not Declared for Prescription Verification   Dextromethorphan               PRESENT      UNEXPECTED   Dextrorphan/Levorphanol         PRESENT      UNEXPECTED    Dextrorphan is an expected metabolite of dextromethorphan, an over-    the-counter or prescription cough suppressant. Dextrorphan cannot be    distinguished from the scheduled prescription medication levorphanol    by the method used for analysis.  ==================================================================== Test                      Result    Flag   Units      Ref Range   Creatinine              153              mg/dL      >=20 ==================================================================== Declared Medications:  The flagging and interpretation on this report are based on the  following declared medications.  Unexpected results may arise from  inaccuracies in the declared medications.   **Note: The testing scope of this panel includes these medications:   Bupropion (Wellbutrin)  Tramadol (Ultram)   **Note: The testing scope of this panel does not include the  following reported medications:   Famotidine (Pepcid)  Hydrochlorothiazide (Hydrodiuril)  Losartan (Cozaar)  Meloxicam (Mobic)  Mirabegron (Myrbetriq)  Rabeprazole (Aciphex) ==================================================================== For clinical consultation, please call [phone]. ====================================================================      ROS  Constitutional: Denies any fever or chills Gastrointestinal: No reported hemesis, hematochezia, vomiting, or acute GI distress Musculoskeletal: Denies any acute onset joint swelling, redness, loss of ROM, or weakness Neurological: No reported episodes of acute onset apraxia, aphasia, dysarthria, agnosia, amnesia, paralysis, loss of coordination, or loss of consciousness  Medication Review  RABEprazole, famotidine, hydrochlorothiazide, losartan, potassium chloride, and traMADol  History Review  Allergy: Ms. Smarr has No Known Allergies. Drug: Ms. Hulett  Delgado no history of drug use. Alcohol:  Delgado  no history of alcohol use. Tobacco:  Delgado that she has never smoked. She has never used smokeless tobacco. Social: Ms. Mapel  Delgado that she has never smoked. She has never used smokeless tobacco. She Delgado that she does not drink alcohol and does not use drugs. Medical:  has a past medical history of Anginal pain ([place]), Arthritis, Colon polyp, GERD (gastroesophageal reflux disease), Hiatal hernia, History of hiatal hernia, Hypertension, Obesity, Reflux, Sleep apnea, and Small vessel disease ([place]). Surgical: Ms. Plumb  has a past surgical history that includes Joint replacement (Right); Joint replacement; Cholecystectomy; Abdominal hysterectomy; Shoulder arthroscopy with rotator cuff repair and subacromial decompression (Right, 06/14/2015); Esophagogastroduodenoscopy (egd) with propofol (N/A, 10/11/2015); Savory dilation (N/A, 10/11/2015); Colonoscopy w/ polypectomy; Esophagogastroduodenoscopy (egd) with propofol (N/A, 12/10/2017); and Colonoscopy with propofol (N/A, 12/10/2017). Family: family history includes Alcoholism in an other family member; Arthritis in an other family member; Breast cancer in her maternal grandmother and another family member; Breast cancer (age of onset: 39) in her mother; Diabetes in an other family member; Heart disease in her father; Hyperlipidemia in her father; Hypertension in her father; Stroke in her father.  Laboratory Chemistry Profile   Renal Lab Results  Component Value Date   BUN 13 09/13/2020   CREATININE 0.85 09/13/2020   BCR 15 09/13/2020   GFR 79.49 09/21/2019   GFRAA 81 09/13/2020   GFRNONAA 71 09/13/2020     Hepatic Lab Results  Component Value Date   AST 19 09/13/2020   ALT 30 09/13/2020   ALBUMIN 3.8 09/13/2020   ALKPHOS 87 09/13/2020   LIPASE 36 10/12/2017     Electrolytes Lab Results  Component Value Date   NA 144 09/13/2020   K 3.5 09/13/2020   CL 105 09/13/2020   CALCIUM 9.5 09/13/2020   MG 2.0 05/22/2020     Bone Lab  Results  Component Value Date   VD25OH 14.17 (L) 09/21/2019   25OHVITD1 18 (L) 05/22/2020   25OHVITD2 13 05/22/2020   25OHVITD3 5.3 05/22/2020     Inflammation (CRP: Acute Phase) (ESR: Chronic Phase) Lab Results  Component Value Date   CRP 15 (H) 05/22/2020   ESRSEDRATE 73 (H) 05/22/2020       Note: Above Lab results reviewed.  Recent Imaging Review  DG PAIN CLINIC C-ARM 1-60 MIN NO REPORT Fluoro was used, but no Radiologist interpretation will be provided.  Please refer to "NOTES" tab for provider progress note. Note: Reviewed        Physical Exam  General appearance: Well nourished, well developed, and well hydrated. In no apparent acute distress Mental status: Alert, oriented x 3 (person, place, & time)       Respiratory:  No evidence of acute respiratory distress Eyes: PERLA Vitals: BP 137/79    Pulse 64    Temp (!) 97.5 F (36.4 C)    Ht '5\' 3"'  (1.6 m)    Wt 270 lb (122.5 kg)    SpO2 100%    BMI 47.83 kg/m  BMI: Estimated body mass index is 47.83 kg/m as calculated from the following:   Height as of this encounter: '5\' 3"'  (1.6 m).   Weight as of this encounter: 270 lb (122.5 kg). Ideal: Ideal body weight: 52.4 kg (115 lb 8.3 oz) Adjusted ideal body weight: 80.4 kg (177 lb 5 oz)  Assessment   Status Diagnosis  Controlled Controlled Controlled 1. Chronic pain syndrome   2. Lumbosacral facet syndrome (Bilateral) (R>L)   3. Chronic low back pain (Bilateral) (R>L) w/o sciatica   4. Lumbar Grade 1 Anterolisthesis of L4/L5   5. DDD (degenerative disc disease), lumbosacral   6. Chronic knee pain (3ry area of Pain) (Left)      Updated Problems: No problems updated.  Plan of Care  Problem-specific:  No problem-specific Assessment & Plan notes found for this encounter.  Ms. ALITA WALDREN has a current medication list which includes the following long-term medication(s): aciphex, famotidine, losartan, potassium chloride, tramadol, and  hydrochlorothiazide.  Pharmacotherapy (Medications Ordered): No orders of the defined types were placed in this encounter.  Orders:  Orders Placed This Encounter  Procedures   LUMBAR FACET(MEDIAL BRANCH NERVE BLOCK) MBNB    Standing Status:   Standing    Number of Occurrences:   1    Standing Expiration Date:   04/22/2021    Scheduling Instructions:     Procedure: Lumbar facet block (AKA.: Lumbosacral medial branch nerve block)     Side: Bilateral     Level: L3-4, L4-5, & L5-S1 Facets (L2, L3, L4, L5, & S1 Medial Branch Nerves)     Sedation: Patient's choice.     Timeframe: PRN    Order Specific Question:   Where will this procedure be performed?    Answer:   [facility]   Follow-up plan:   Return for PRN Procedure (w/ sedation): (B) L-FCT BLK #2.      Interventional management options: Planned, scheduled, and/or pending:       Considering:   Diagnostic right L4-5 LESI #2  Diagnostic bilateral lumbar facet block #2    PRN Procedures:   Diagnostic bilateral lumbar facet block #2     Recent Visits Date Type Provider Dept  09/19/20 Procedure visit Milinda Pointer, MD [facility]  08/23/20 Office Visit Milinda Pointer, MD [facility]  07/27/20 Procedure visit Milinda Pointer, MD [facility]  Showing recent visits within past 90 days and meeting all other requirements Today's Visits Date Type Provider Dept  10/23/20 Office Visit Milinda Pointer, MD [facility]  Showing today's visits and meeting all other requirements Future Appointments No visits were found meeting these conditions. Showing future appointments within next 90 days and meeting all other requirements  I discussed the assessment and treatment plan with the patient. The patient was provided an opportunity to ask questions and all were answered. The patient agreed with the plan and demonstrated an understanding of the instructions.  Patient advised  to call back or seek an in-person evaluation if the symptoms or condition worsens.  Duration of encounter: 30 minutes.  Note by: Gaspar Cola, MD Date: 10/23/2020; Time: 3:08 PM

## 2020-10-23 ENCOUNTER — Encounter: Payer: Self-pay | Admitting: Pain Medicine

## 2020-10-23 ENCOUNTER — Other Ambulatory Visit: Payer: Self-pay

## 2020-10-23 ENCOUNTER — Ambulatory Visit: Payer: Medicare Other | Attending: Pain Medicine | Admitting: Pain Medicine

## 2020-10-23 VITALS — BP 137/79 | HR 64 | Temp 97.5°F | Ht 63.0 in | Wt 270.0 lb

## 2020-10-23 DIAGNOSIS — G894 Chronic pain syndrome: Secondary | ICD-10-CM | POA: Insufficient documentation

## 2020-10-23 DIAGNOSIS — G8929 Other chronic pain: Secondary | ICD-10-CM | POA: Insufficient documentation

## 2020-10-23 DIAGNOSIS — M545 Low back pain, unspecified: Secondary | ICD-10-CM | POA: Insufficient documentation

## 2020-10-23 DIAGNOSIS — M5137 Other intervertebral disc degeneration, lumbosacral region: Secondary | ICD-10-CM | POA: Insufficient documentation

## 2020-10-23 DIAGNOSIS — M25562 Pain in left knee: Secondary | ICD-10-CM | POA: Diagnosis not present

## 2020-10-23 DIAGNOSIS — M4310 Spondylolisthesis, site unspecified: Secondary | ICD-10-CM | POA: Diagnosis not present

## 2020-10-23 DIAGNOSIS — M47817 Spondylosis without myelopathy or radiculopathy, lumbosacral region: Secondary | ICD-10-CM | POA: Diagnosis not present

## 2020-10-23 NOTE — Patient Instructions (Signed)
____________________________________________________________________________________________  Preparing for Procedure with Sedation  Procedure appointments are limited to planned procedures: . No Prescription Refills. . No disability issues will be discussed. . No medication changes will be discussed.  Instructions: . Oral Intake: Do not eat or drink anything for at least 8 hours prior to your procedure. (Exception: Blood Pressure Medication. See below.) . Transportation: Unless otherwise stated by your physician, you may drive yourself after the procedure. . Blood Pressure Medicine: Do not forget to take your blood pressure medicine with a sip of water the morning of the procedure. If your Diastolic (lower reading)is above 100 mmHg, elective cases will be cancelled/rescheduled. . Blood thinners: These will need to be stopped for procedures. Notify our staff if you are taking any blood thinners. Depending on which one you take, there will be specific instructions on how and when to stop it. . Diabetics on insulin: Notify the staff so that you can be scheduled 1st case in the morning. If your diabetes requires high dose insulin, take only  of your normal insulin dose the morning of the procedure and notify the staff that you have done so. . Preventing infections: Shower with an antibacterial soap the morning of your procedure. . Build-up your immune system: Take 1000 mg of Vitamin C with every meal (3 times a day) the day prior to your procedure. . Antibiotics: Inform the staff if you have a condition or reason that requires you to take antibiotics before dental procedures. . Pregnancy: If you are pregnant, call and cancel the procedure. . Sickness: If you have a cold, fever, or any active infections, call and cancel the procedure. . Arrival: You must be in the facility at least 30 minutes prior to your scheduled procedure. . Children: Do not bring children with you. . Dress appropriately:  Bring dark clothing that you would not mind if they get stained. . Valuables: Do not bring any jewelry or valuables.  Reasons to call and reschedule or cancel your procedure: (Following these recommendations will minimize the risk of a serious complication.) . Surgeries: Avoid having procedures within 2 weeks of any surgery. (Avoid for 2 weeks before or after any surgery). . Flu Shots: Avoid having procedures within 2 weeks of a flu shots or . (Avoid for 2 weeks before or after immunizations). . Barium: Avoid having a procedure within 7-10 days after having had a radiological study involving the use of radiological contrast. (Myelograms, Barium swallow or enema study). . Heart attacks: Avoid any elective procedures or surgeries for the initial 6 months after a "Myocardial Infarction" (Heart Attack). . Blood thinners: It is imperative that you stop these medications before procedures. Let us know if you if you take any blood thinner.  . Infection: Avoid procedures during or within two weeks of an infection (including chest colds or gastrointestinal problems). Symptoms associated with infections include: Localized redness, fever, chills, night sweats or profuse sweating, burning sensation when voiding, cough, congestion, stuffiness, runny nose, sore throat, diarrhea, nausea, vomiting, cold or Flu symptoms, recent or current infections. It is specially important if the infection is over the area that we intend to treat. . Heart and lung problems: Symptoms that may suggest an active cardiopulmonary problem include: cough, chest pain, breathing difficulties or shortness of breath, dizziness, ankle swelling, uncontrolled high or unusually low blood pressure, and/or palpitations. If you are experiencing any of these symptoms, cancel your procedure and contact your primary care physician for an evaluation.  Remember:  Regular Business hours are:    Monday to Thursday 8:00 AM to 4:00 PM  Provider's  Schedule: Makaylah Oddo, MD:  Procedure days: Tuesday and Thursday 7:30 AM to 4:00 PM  Bilal Lateef, MD:  Procedure days: Monday and Wednesday 7:30 AM to 4:00 PM ____________________________________________________________________________________________    

## 2020-10-26 ENCOUNTER — Ambulatory Visit
Admission: RE | Admit: 2020-10-26 | Discharge: 2020-10-26 | Disposition: A | Discharge status: Home / Self Care | Payer: Medicare Other | Source: Ambulatory Visit | Attending: Gastroenterology | Admitting: Gastroenterology

## 2020-10-26 ENCOUNTER — Other Ambulatory Visit: Payer: Self-pay

## 2020-10-26 DIAGNOSIS — R1310 Dysphagia, unspecified: Secondary | ICD-10-CM | POA: Diagnosis not present

## 2020-10-26 DIAGNOSIS — K219 Gastro-esophageal reflux disease without esophagitis: Secondary | ICD-10-CM | POA: Diagnosis not present

## 2020-10-26 DIAGNOSIS — R1314 Dysphagia, pharyngoesophageal phase: Secondary | ICD-10-CM | POA: Diagnosis not present

## 2020-10-26 DIAGNOSIS — R1010 Upper abdominal pain, unspecified: Secondary | ICD-10-CM

## 2020-10-26 DIAGNOSIS — K449 Diaphragmatic hernia without obstruction or gangrene: Secondary | ICD-10-CM | POA: Diagnosis not present

## 2020-10-26 DIAGNOSIS — R053 Chronic cough: Secondary | ICD-10-CM | POA: Diagnosis not present

## 2020-11-01 ENCOUNTER — Telehealth: Payer: Self-pay | Admitting: Family

## 2020-11-01 NOTE — Telephone Encounter (Signed)
Call pt  [facility] in my understanding offers an array of screenings however in my experience I have found either duplicative or not necessary. She is welcome to sit down with them to see what they would recommend . She may sch an appt with me to discuss any recommendations if she would like

## 2020-11-01 NOTE — Telephone Encounter (Signed)
Pt wanted to know if [name] recommended  her to have the [facility] screening done that is coming to town

## 2020-11-03 NOTE — Telephone Encounter (Signed)
LM for patient to call back office.

## 2020-11-27 ENCOUNTER — Telehealth: Payer: Self-pay | Admitting: Cardiology

## 2020-11-27 NOTE — Telephone Encounter (Signed)
Attempted to schedule.  LMOV to call office.  ° °

## 2020-11-27 NOTE — Telephone Encounter (Signed)
Scheduled

## 2020-11-27 NOTE — Telephone Encounter (Signed)
-----   Message from Horton Finer sent at 11/24/2020  2:46 PM EST ----- Regarding: appointment Please schedule overdue F/U appt with Dr. Garen Lah for refills. Thank you!

## 2020-12-11 ENCOUNTER — Ambulatory Visit: Payer: Medicare Other | Admitting: Cardiology

## 2020-12-11 ENCOUNTER — Encounter: Payer: Self-pay | Admitting: Cardiology

## 2020-12-11 ENCOUNTER — Other Ambulatory Visit: Payer: Self-pay

## 2020-12-11 VITALS — BP 118/68 | HR 87 | Ht 63.0 in | Wt 278.5 lb

## 2020-12-11 DIAGNOSIS — I10 Essential (primary) hypertension: Secondary | ICD-10-CM | POA: Diagnosis not present

## 2020-12-11 DIAGNOSIS — R600 Localized edema: Secondary | ICD-10-CM | POA: Diagnosis not present

## 2020-12-11 MED ORDER — POTASSIUM CHLORIDE ER 10 MEQ PO TBCR: 10.0000 meq | EXTENDED_RELEASE_TABLET | Freq: Every day | ORAL | 1 refills | Status: DC

## 2020-12-11 MED ORDER — TORSEMIDE 20 MG PO TABS: 20.0000 mg | ORAL_TABLET | Freq: Two times a day (BID) | ORAL | 3 refills | Status: DC

## 2020-12-11 NOTE — Progress Notes (Signed)
Cardiology Office Note:    Date:  12/11/2020   ID:  Jacqueline Delgado, DOB Jan 13, 1951, MRN [id number]  PCP:  Burnard Hawthorne, FNP  Cardiologist:  Kate Sable, MD  Electrophysiologist:  None   Referring MD: Burnard Hawthorne, FNP   Chief Complaint  Patient presents with  . Other    OD f/u c/o excessive swelling bilateral legs/ankles and right knee pain. Meds reviewed verbally with pt.    History of Present Illness:    Jacqueline Delgado is a 70 y.o. female with a hx of hypertension, obesity presents for follow-up.    She was previously seen due to shortness of breath, hypertension and edema.  Echocardiogram was ordered to evaluate cardiac function.  Amlodipine was stopped, losartan started.  HCTZ also added.  Echocardiogram on 12/20/2019 showed normal systolic and diastolic function.  Refer to pulmonary medicine with regards to sleep apnea.  Saw pulmonary medicine, she states the sleep study did not reveal any significant apnea.  She has left knee pain, trying to get surgery, but needs to lose a certain amount of weight before surgery can be done.  She still has lower extremity edema.  Blood pressures have been better since medication adjustments.   Past Medical History:  Diagnosis Date  . Anginal pain ([place])     3/8-12/21/10  . Arthritis    Osteoarthritis in BLE knee  . Colon polyp   . GERD (gastroesophageal reflux disease)   . Hiatal hernia   . History of hiatal hernia   . Hypertension    controlled  . Obesity   . Reflux   . Sleep apnea    NO CPAP  . Small vessel disease [facility])     Past Surgical History:  Procedure Laterality Date  . ABDOMINAL HYSTERECTOMY     partial; per patient has left ovary  . CHOLECYSTECTOMY    . COLONOSCOPY W/ POLYPECTOMY     adenomatous colon polyp  . COLONOSCOPY WITH PROPOFOL N/A 12/10/2017   Procedure: COLONOSCOPY WITH PROPOFOL;  Surgeon: Manya Silvas, MD;  Location: [facility] ENDOSCOPY;  Service: Endoscopy;  Laterality: N/A;  .  ESOPHAGOGASTRODUODENOSCOPY (EGD) WITH PROPOFOL N/A 10/11/2015   Procedure: ESOPHAGOGASTRODUODENOSCOPY (EGD) WITH PROPOFOL;  Surgeon: Manya Silvas, MD;  Location: [facility] ENDOSCOPY;  Service: Endoscopy;  Laterality: N/A;  . ESOPHAGOGASTRODUODENOSCOPY (EGD) WITH PROPOFOL N/A 12/10/2017   Procedure: ESOPHAGOGASTRODUODENOSCOPY (EGD) WITH PROPOFOL;  Surgeon: Manya Silvas, MD;  Location: [facility] ENDOSCOPY;  Service: Endoscopy;  Laterality: N/A;  . JOINT REPLACEMENT Right    knee, Oct 2012  . JOINT REPLACEMENT     hopefully getting left partial knee replacement in July 2016  . SAVORY DILATION N/A 10/11/2015   Procedure: SAVORY DILATION;  Surgeon: Manya Silvas, MD;  Location: [facility] ENDOSCOPY;  Service: Endoscopy;  Laterality: N/A;  . SHOULDER ARTHROSCOPY WITH ROTATOR CUFF REPAIR AND SUBACROMIAL DECOMPRESSION Right 06/14/2015   Procedure: SHOULDER ARTHROSCOPY WITH mini open ROTATOR CUFF REPAIR AND SUBACROMIAL DECOMPRESSION, release long head biceps tendon.;  Surgeon: Leanor Kail, MD;  Location: [facility] ORS;  Service: Orthopedics;  Laterality: Right;     Current Medications: Current Meds  Medication Sig  . ACIPHEX 20 MG tablet Take 1 tablet (20 mg total) by mouth in the morning and at bedtime.  . famotidine (PEPCID) 40 MG tablet Take 40 mg by mouth at bedtime.  Marland Kitchen losartan (COZAAR) 50 MG tablet Take 1 tablet (50 mg total) by mouth daily.  Marland Kitchen oxybutynin (DITROPAN-XL) 10 MG 24 hr tablet Take 10  mg by mouth at bedtime.  . torsemide (DEMADEX) 20 MG tablet Take 1 tablet (20 mg total) by mouth 2 (two) times daily.  . traMADol (ULTRAM) 50 MG tablet Take 2 tablets (100 mg total) by mouth every 6 (six) hours as needed for severe pain. Must last 30 days  . [DISCONTINUED] hydrochlorothiazide (HYDRODIURIL) 25 MG tablet Take 1 tablet (25 mg total) by mouth daily.  . [DISCONTINUED] potassium chloride (KLOR-CON) 10 MEQ tablet Take 1 tablet (10 mEq total) by mouth every other day.     Allergies:   Patient has no  known allergies.   Social History   Socioeconomic History  . Marital status: Single    Spouse name: Not on file  . Number of children: Not on file  . Years of education: Not on file  . Highest education level: Not on file  Occupational History  . Not on file  Tobacco Use  . Smoking status: Never Smoker  . Smokeless tobacco: Never Used  Vaping Use  . Vaping Use: Never used  Substance and Sexual Activity  . Alcohol use: No  . Drug use: No  . Sexual activity: Not on file  Other Topics Concern  . Not on file  Social History Narrative   Lost a son on march 8th 20+ years.    March through Mother's Day is a very hard time every year.    Works with [employer] until 09/2020   Social Determinants of Health   Financial Resource Strain: Not on file  Food Insecurity: No Food Insecurity  . Worried About Charity fundraiser in the Last Year: Never true  . Ran Out of Food in the Last Year: Never true  Transportation Needs: No Transportation Needs  . Lack of Transportation (Medical): No  . Lack of Transportation (Non-Medical): No  Physical Activity: Not on file  Stress: Not on file  Social Connections: Not on file     Family History: The patient's family history includes Alcoholism in an other family member; Arthritis in an other family member; Breast cancer in her maternal grandmother and another family member; Breast cancer (age of onset: 36) in her mother; Diabetes in an other family member; Heart disease in her father; Hyperlipidemia in her father; Hypertension in her father; Stroke in her father.  ROS:   Please see the history of present illness.     All other systems reviewed and are negative.  EKGs/Labs/Other Studies Reviewed:    The following studies were reviewed today:   EKG:  EKG is  ordered today.  The ekg ordered today demonstrates normal sinus rhythm  Recent Labs: 04/17/2020: Hemoglobin 11.4; Platelets 215 05/22/2020: Magnesium 2.0 09/13/2020: ALT 30; BUN 13;  Creatinine, Ser 0.85; Potassium 3.5; Sodium 144; TSH 1.480  Recent Lipid Panel    Component Value Date/Time   CHOL 134 09/13/2020 0912   CHOL 135 06/10/2014 0411   TRIG 64 09/13/2020 0912   TRIG 63 06/10/2014 0411   HDL 38 (L) 09/13/2020 0912   HDL 36 (L) 06/10/2014 0411   CHOLHDL 3.5 09/13/2020 0912   VLDL 13 06/10/2014 0411   LDLCALC 83 09/13/2020 0912   LDLCALC 86 06/10/2014 0411    Physical Exam:    VS:  BP 118/68 (BP Location: Right Arm, Patient Position: Sitting, Cuff Size: Large)   Pulse 87   Ht 5\' 3"  (1.6 m)   Wt 278 lb 8 oz (126.3 kg)   SpO2 98%   BMI 49.33 kg/m  Wt Readings from Last 3 Encounters:  12/11/20 278 lb 8 oz (126.3 kg)  10/23/20 270 lb (122.5 kg)  09/19/20 284 lb (128.8 kg)     GEN:  Well nourished, well developed in no acute distress HEENT: Normal NECK: No JVD; No carotid bruits LYMPHATICS: No lymphadenopathy CARDIAC: RRR, no murmurs, rubs, gallops RESPIRATORY:  Clear to auscultation without rales, wheezing or rhonchi  ABDOMEN: Soft, non-tender, non-distended MUSCULOSKELETAL: 2+ edema at ankles; No deformity  SKIN: Warm and dry NEUROLOGIC:  Alert and oriented x 3 PSYCHIATRIC:  Normal affect   ASSESSMENT:    1. Edema of both lower extremities   2. Essential (primary) hypertension   3. Morbid obesity ([place])    PLAN:    In order of problems listed above:  1. Patient has edema in both lower extremities.  Please secondary to obesity.  Last echo 12/2019 with normal systolic and diastolic function start torsemide daily 20 mg.  Check BMP 7 days. 2. Hypertension, BP controlled.  Continue losartan, stop HCTZ, start torsemide. 3. Morbidly obese, low-calorie diet, advised.  Follow-up in 2 weeks   Medication Adjustments/Labs and Tests Ordered: Current medicines are reviewed at length with the patient today.  Concerns regarding medicines are outlined above.  Orders Placed This Encounter  Procedures  . Basic metabolic panel   Meds ordered  this encounter  Medications  . potassium chloride (KLOR-CON) 10 MEQ tablet    Sig: Take 1 tablet (10 mEq total) by mouth daily.    Dispense:  90 tablet    Refill:  1  . torsemide (DEMADEX) 20 MG tablet    Sig: Take 1 tablet (20 mg total) by mouth 2 (two) times daily.    Dispense:  30 tablet    Refill:  3    Patient Instructions  Medication Instructions:  Your physician has recommended you make the following change in your medication:   1.  STOP taking your hydrochlorothiazide (HYDRODIURIL) 2.  START taking Torsemide (Demadex) 20 MG: one tab by mouth daily. 3.  INCREASE your Potassium to 10 MEQ: one tab by mouth daily. *If you need a refill on your cardiac medications before your next appointment, please call your pharmacy*   Lab Work:  Your physician recommends that you return for lab work in: 1 week. - Please go to the [facility]. You will check in at the front desk to the right as you walk into the atrium. Valet Parking is offered if needed. - No appointment needed. You may go any day between 7 am and 6 pm.     Testing/Procedures: None ordered   Follow-Up: At [facility], you and your health needs are our priority.  As part of our continuing mission to provide you with exceptional heart care, we have created designated Provider Care Teams.  These Care Teams include your primary Cardiologist (physician) and Advanced Practice Providers (APPs -  Physician Assistants and Nurse Practitioners) who all work together to provide you with the care you need, when you need it.  We recommend signing up for the patient portal called "MyChart".  Sign up information is provided on this After Visit Summary.  MyChart is used to connect with patients for Virtual Visits (Telemedicine).  Patients are able to view lab/test results, encounter notes, upcoming appointments, etc.  Non-urgent messages can be sent to your provider as well.   To learn more about what you can do with MyChart, go  to [email or web address].    Your next appointment:  2 week(s)  The format for your next appointment:   In Person  Provider:   Kate Sable, MD   Other Instructions      Signed, Kate Sable, MD  12/11/2020 5:06 PM    [place]

## 2020-12-11 NOTE — Patient Instructions (Signed)
Medication Instructions:  Your physician has recommended you make the following change in your medication:   1.  STOP taking your hydrochlorothiazide (HYDRODIURIL) 2.  START taking Torsemide (Demadex) 20 MG: one tab by mouth daily. 3.  INCREASE your Potassium to 10 MEQ: one tab by mouth daily. *If you need a refill on your cardiac medications before your next appointment, please call your pharmacy*   Lab Work:  Your physician recommends that you return for lab work in: 1 week. - Please go to the [facility]. You will check in at the front desk to the right as you walk into the atrium. Valet Parking is offered if needed. - No appointment needed. You may go any day between 7 am and 6 pm.     Testing/Procedures: None ordered   Follow-Up: At [facility], you and your health needs are our priority.  As part of our continuing mission to provide you with exceptional heart care, we have created designated Provider Care Teams.  These Care Teams include your primary Cardiologist (physician) and Advanced Practice Providers (APPs -  Physician Assistants and Nurse Practitioners) who all work together to provide you with the care you need, when you need it.  We recommend signing up for the patient portal called "MyChart".  Sign up information is provided on this After Visit Summary.  MyChart is used to connect with patients for Virtual Visits (Telemedicine).  Patients are able to view lab/test results, encounter notes, upcoming appointments, etc.  Non-urgent messages can be sent to your provider as well.   To learn more about what you can do with MyChart, go to [email or web address].    Your next appointment:   2 week(s)  The format for your next appointment:   In Person  Provider:   Kate Sable, MD   Other Instructions

## 2020-12-13 ENCOUNTER — Encounter: Payer: Self-pay | Admitting: Family

## 2020-12-13 ENCOUNTER — Telehealth (INDEPENDENT_AMBULATORY_CARE_PROVIDER_SITE_OTHER): Payer: Medicare Other | Admitting: Family

## 2020-12-13 DIAGNOSIS — Z6841 Body Mass Index (BMI) 40.0 and over, adult: Secondary | ICD-10-CM

## 2020-12-13 DIAGNOSIS — K219 Gastro-esophageal reflux disease without esophagitis: Secondary | ICD-10-CM

## 2020-12-13 DIAGNOSIS — I10 Essential (primary) hypertension: Secondary | ICD-10-CM | POA: Diagnosis not present

## 2020-12-13 DIAGNOSIS — E662 Morbid (severe) obesity with alveolar hypoventilation: Secondary | ICD-10-CM

## 2020-12-13 MED ORDER — ONDANSETRON 4 MG PO TBDP: 4.0000 mg | ORAL_TABLET | Freq: Three times a day (TID) | ORAL | 1 refills | Status: DC | PRN

## 2020-12-13 MED ORDER — WEGOVY 0.25 MG/0.5ML ~~LOC~~ SOAJ: 0.2500 mg | SUBCUTANEOUS | 2 refills | Status: DC

## 2020-12-13 NOTE — Assessment & Plan Note (Signed)
Controlled. Continue losartan 50mg  

## 2020-12-13 NOTE — Assessment & Plan Note (Addendum)
Overall controlled with pepcid 40mg  qd, aciphex 20mg  BID. Breakthrough nausea and I have given zofran prn for this. Advised weightloss, avoiding trigger foods, and not laying down after meals. We agreed not to change regimen at this time.

## 2020-12-13 NOTE — Assessment & Plan Note (Addendum)
No contraindication for GLP 1 agonist. Counseled on black box warning. Start wegovy. Close follow up.

## 2020-12-13 NOTE — Patient Instructions (Addendum)
Start wegovy  We have discussed starting non insulin daily injectable medication called Wegovy  which is a glucagon like peptide (GLP 1) agonist and works by delaying gastric emptying and increasing insulin secretion.It is given once per week. Most patients see significant weight loss with this drug class.   You may NOT take either medication if you or your family has history of thyroid, parathyroid, OR adrenal cancer. Please confirm you and your family does NOT have this history as this drug class has black box warning on this medication for that reason.   Advise to follow with directions on prescription and slowly increase from 0.25mg  [place] once per week ;stay here for 4 weeks. You may then increase to 0.5mg  [place] once per week and stay there for 4 weeks.  We can slowly titrate further at follow up with goal of no more than 1-2 lbs weight loss per week.

## 2020-12-13 NOTE — Progress Notes (Signed)
Virtual Visit via Video Note  I connected with@  on 12/13/20 at  3:30 PM EST by a video enabled telemedicine application and verified that I am speaking with the correct person using two identifiers.  Location patient: home Location provider:work  Persons participating in the virtual visit: patient, provider  I discussed the limitations of evaluation and management by telemedicine and the availability of in person appointments. The patient expressed understanding and agreed to proceed.   HPI: Continues to be frustrated by weight and chronic left knee pain She feels confined to her home, she is using a walker and struggling to clean her home, take a shower due to knee pain. She needs a total knee replacement of left however she was told she needed to loose weight prior. She cannot exercise due to pain. Cymbalta made her feel nauseated.  She is interested weight loss medication.  No personal or family h/o thyroid cancer.  H/o  Prediabetes   She has occasional nausea after meal , or when laying down. Symptom associated with bitter taste in mouth, occasional regurgitation.  She is taking pepcid 40mg  qd, aciphex 20mg  BID. It is improved from the past and triggered by foods such as hamburger , onions. Would like refill zofran for nausea.  Takes tramadol 50mg  twice per day as prescribed by Dr Lowella Dandy.   Leg swelling has improved has improved 'a lot' .  Dr Charlestine Night started demadex last week.  HTN- compliant with losartan 50mg .    ROS: See pertinent positives and negatives per HPI.    EXAM:  VITALS per patient if applicable: There were no vitals taken for this visit. BP Readings from Last 3 Encounters:  12/11/20 118/68  10/23/20 137/79  09/19/20 121/80   Wt Readings from Last 3 Encounters:  12/11/20 278 lb 8 oz (126.3 kg)  10/23/20 270 lb (122.5 kg)  09/19/20 284 lb (128.8 kg)  There is no height or weight on file to calculate BMI.  BMI 49  GENERAL: alert, oriented, appears well  and in no acute distress  HEENT: atraumatic, conjunttiva clear, no obvious abnormalities on inspection of external nose and ears  NECK: normal movements of the head and neck  LUNGS: on inspection no signs of respiratory distress, breathing rate appears normal, no obvious gross SOB, gasping or wheezing  CV: no obvious cyanosis  MS: moves all visible extremities without noticeable abnormality  PSYCH/NEURO: pleasant and cooperative, no obvious depression or anxiety, speech and thought processing grossly intact  ASSESSMENT AND PLAN:  Discussed the following assessment and plan:  Problem List Items Addressed This Visit      Cardiovascular and Mediastinum   Essential hypertension    Controlled. Continue losartan 50mg         Respiratory   Class 3 obesity with alveolar hypoventilation, serious comorbidity, and body mass index (BMI) of 50.0 to 59.9 in adult [facility]) - Primary (Chronic)    No contraindication for GLP 1 agonist. Counseled on black box warning. Start wegovy. Close follow up.       Relevant Medications   Semaglutide-Weight Management (WEGOVY) 0.25 MG/0.5ML SOAJ     Digestive   GERD (gastroesophageal reflux disease)    Overall controlled with pepcid 40mg  qd, aciphex 20mg  BID. Breakthrough nausea and I have given zofran prn for this. Advised weightloss, avoiding trigger foods, and not laying down after meals. We agreed not to change regimen at this time.       Relevant Medications   ondansetron (ZOFRAN ODT) 4 MG disintegrating tablet      -  we discussed possible serious and likely etiologies, options for evaluation and workup, limitations of telemedicine visit vs in person visit, treatment, treatment risks and precautions. Pt prefers to treat via telemedicine empirically rather then risking or undertaking an in person visit at this moment.  .   I discussed the assessment and treatment plan with the patient. The patient was provided an opportunity to ask questions and all  were answered. The patient agreed with the plan and demonstrated an understanding of the instructions.   The patient was advised to call back or seek an in-person evaluation if the symptoms worsen or if the condition fails to improve as anticipated.   Mable Paris, FNP

## 2020-12-13 NOTE — Addendum Note (Signed)
Addended by: Ernie Hew D on: 12/13/2020 04:30 PM   Modules accepted: Orders

## 2020-12-14 ENCOUNTER — Other Ambulatory Visit: Payer: Self-pay | Admitting: Family

## 2020-12-14 DIAGNOSIS — Z96651 Presence of right artificial knee joint: Secondary | ICD-10-CM | POA: Diagnosis not present

## 2020-12-14 DIAGNOSIS — M5442 Lumbago with sciatica, left side: Secondary | ICD-10-CM | POA: Diagnosis not present

## 2020-12-14 DIAGNOSIS — E662 Morbid (severe) obesity with alveolar hypoventilation: Secondary | ICD-10-CM

## 2020-12-14 DIAGNOSIS — Z6841 Body Mass Index (BMI) 40.0 and over, adult: Secondary | ICD-10-CM

## 2020-12-14 DIAGNOSIS — I776 Arteritis, unspecified: Secondary | ICD-10-CM | POA: Diagnosis not present

## 2020-12-14 DIAGNOSIS — M1712 Unilateral primary osteoarthritis, left knee: Secondary | ICD-10-CM | POA: Diagnosis not present

## 2020-12-18 ENCOUNTER — Other Ambulatory Visit: Payer: Self-pay | Admitting: Family

## 2020-12-18 DIAGNOSIS — E662 Morbid (severe) obesity with alveolar hypoventilation: Secondary | ICD-10-CM

## 2020-12-18 DIAGNOSIS — Z6841 Body Mass Index (BMI) 40.0 and over, adult: Secondary | ICD-10-CM

## 2020-12-19 ENCOUNTER — Other Ambulatory Visit: Payer: Self-pay | Admitting: Family

## 2020-12-19 DIAGNOSIS — E662 Morbid (severe) obesity with alveolar hypoventilation: Secondary | ICD-10-CM

## 2020-12-19 DIAGNOSIS — Z6841 Body Mass Index (BMI) 40.0 and over, adult: Secondary | ICD-10-CM

## 2020-12-22 ENCOUNTER — Telehealth: Payer: Self-pay | Admitting: Family

## 2020-12-22 ENCOUNTER — Other Ambulatory Visit: Payer: Self-pay | Admitting: Family

## 2020-12-22 ENCOUNTER — Telehealth: Payer: Self-pay | Admitting: Cardiology

## 2020-12-22 DIAGNOSIS — Z6841 Body Mass Index (BMI) 40.0 and over, adult: Secondary | ICD-10-CM

## 2020-12-22 DIAGNOSIS — I10 Essential (primary) hypertension: Secondary | ICD-10-CM

## 2020-12-22 DIAGNOSIS — E662 Morbid (severe) obesity with alveolar hypoventilation: Secondary | ICD-10-CM

## 2020-12-22 MED ORDER — TRULICITY 0.75 MG/0.5ML ~~LOC~~ SOAJ: 0.7500 mg | SUBCUTANEOUS | 2 refills | Status: DC

## 2020-12-22 MED ORDER — TORSEMIDE 20 MG PO TABS: 20.0000 mg | ORAL_TABLET | Freq: Every day | ORAL | 3 refills | Status: DC

## 2020-12-22 NOTE — Telephone Encounter (Signed)
Pt stated that she will try to the Trulicity & I told her that PA would most likely be needed. I let her know that I would be happy to do when I received this from pharmacy. She is trying to see it if she can get copay card for Saxenda, but unsure since she was not eligible for [facility] shoe wold be Saxenda. I told her they were almost the same medication, so I wouldn't think so. She will keep searching online & we will wait toi see about Trucicty.

## 2020-12-22 NOTE — Telephone Encounter (Signed)
Call pt  I sent in trulicity

## 2020-12-22 NOTE — Telephone Encounter (Signed)
Received fax from [facility] on [address] in [place] requesting refills for Torsemide 20 mg. Rx request sent to pharmacy.

## 2020-12-22 NOTE — Addendum Note (Signed)
Addended by: Burnard Hawthorne on: 12/22/2020 01:37 PM   Modules accepted: Orders

## 2020-12-22 NOTE — Telephone Encounter (Signed)
Pt cannot afford the [facility] & isnt eligible for their assistance program due to her having medicare. Medicare does not allow PA on Wegovy. Was suggested to patient a medication like Trulicity that she was told works the same way. Would you be willing to send for patient or one in same drug class to see if I can get approved?

## 2020-12-22 NOTE — Telephone Encounter (Signed)
Pt called and wanted to discuss weight loss medication

## 2020-12-25 ENCOUNTER — Ambulatory Visit: Payer: Medicare Other | Admitting: Cardiology

## 2021-01-03 DIAGNOSIS — M1712 Unilateral primary osteoarthritis, left knee: Secondary | ICD-10-CM | POA: Diagnosis not present

## 2021-01-05 ENCOUNTER — Other Ambulatory Visit (HOSPITAL_COMMUNITY): Payer: Self-pay | Admitting: Orthopedic Surgery

## 2021-01-05 ENCOUNTER — Other Ambulatory Visit: Payer: Self-pay | Admitting: Orthopedic Surgery

## 2021-01-05 DIAGNOSIS — M1712 Unilateral primary osteoarthritis, left knee: Secondary | ICD-10-CM

## 2021-01-09 ENCOUNTER — Telehealth: Payer: Self-pay | Admitting: Family

## 2021-01-09 NOTE — Telephone Encounter (Signed)
Patients pharmacy does not have losartan (COZAAR) 50 MG tablet, and she needs a prescription to go to another pharmacy.

## 2021-01-10 ENCOUNTER — Other Ambulatory Visit: Payer: Self-pay

## 2021-01-10 DIAGNOSIS — E119 Type 2 diabetes mellitus without complications: Secondary | ICD-10-CM | POA: Diagnosis not present

## 2021-01-10 DIAGNOSIS — Z0100 Encounter for examination of eyes and vision without abnormal findings: Secondary | ICD-10-CM | POA: Diagnosis not present

## 2021-01-10 NOTE — Telephone Encounter (Signed)
LMTCB to ask patient which pharmacy she wanted me to send Losartan to.

## 2021-01-10 NOTE — Progress Notes (Addendum)
PROVIDER NOTE: Information contained herein reflects review and annotations entered in association with encounter. Interpretation of such information and data should be left to medically-trained personnel. Information provided to patient can be located elsewhere in the medical record under "Patient Instructions". Document created using STT-dictation technology, any transcriptional errors that may result from process are unintentional.    Patient: Jacqueline Delgado  Service Category: Procedure  Provider: Gaspar Cola, MD  DOB: 03-26-1951  DOS: 01/11/2021  Location: [facility]  MRN: [id number]  Setting: Ambulatory - outpatient  Referring Provider: Burnard Hawthorne, FNP  Type: Established Patient  Specialty: Interventional Pain Management  PCP: Burnard Hawthorne, FNP   Primary Reason for Visit: Interventional Pain Management Treatment. CC: Back Pain  Procedure:          Anesthesia, Analgesia, Anxiolysis:  Type: Lumbar Facet, Medial Branch Block(s) #2  Primary Purpose: Diagnostic Region: Posterolateral Lumbosacral Spine Level: L2, L3, L4, L5, & S1 Medial Branch Level(s). Injecting these levels blocks the L3-4, L4-5, and L5-S1 lumbar facet joints. Laterality: Bilateral  Type: Moderate (Conscious) Sedation combined with Local Anesthesia Indication(s): Analgesia and Anxiety Route: Intravenous (IV) IV Access: Secured Sedation: Meaningful verbal contact was maintained at all times during the procedure  Local Anesthetic: Lidocaine 1-2%  Position: Prone   Indications: 1. Lumbosacral facet syndrome (Bilateral) (R>L)   2. Lumbar facet hypertrophy   3. Lumbar Grade 1 Anterolisthesis of L4/L5   4. Spondylosis without myelopathy or radiculopathy, lumbosacral region   5. DDD (degenerative disc disease), lumbosacral   6. Chronic low back pain (Bilateral) (R>L) w/o sciatica    Pain Score: Pre-procedure: 6 /10 Post-procedure: 0-No pain/10   Pre-op H&P Assessment:  Ms. Lovick  is a 70 y.o. (year old), female patient, seen today for interventional treatment. She  has a past surgical history that includes Joint replacement (Right); Joint replacement; Cholecystectomy; Abdominal hysterectomy; Shoulder arthroscopy with rotator cuff repair and subacromial decompression (Right, 06/14/2015); Esophagogastroduodenoscopy (egd) with propofol (N/A, 10/11/2015); Savory dilation (N/A, 10/11/2015); Colonoscopy w/ polypectomy; Esophagogastroduodenoscopy (egd) with propofol (N/A, 12/10/2017); and Colonoscopy with propofol (N/A, 12/10/2017). Ms. Cashatt has a current medication list which includes the following prescription(s): aciphex, trulicity, famotidine, losartan, oxybutynin, potassium chloride, torsemide, ondansetron, and tramadol, and the following Facility-Administered Medications: fentanyl, lactated ringers, and midazolam. Her primarily concern today is the Back Pain  Initial Vital Signs:  Pulse/HCG Rate: 62ECG Heart Rate: 81 Temp: (!) 97.2 F (36.2 C) Resp: 18 BP: 128/84 SpO2: 98 %  BMI: Estimated body mass index is 47.65 kg/m as calculated from the following:   Height as of this encounter: 5\' 3"  (1.6 m).   Weight as of this encounter: 269 lb (122 kg).  Risk Assessment: Allergies: Reviewed. She has No Known Allergies.  Allergy Precautions: None required Coagulopathies: Reviewed. None identified.  Blood-thinner therapy: None at this time Active Infection(s): Reviewed. None identified. Ms. Dexter is afebrile  Site Confirmation: Ms. Baksh was asked to confirm the procedure and laterality before marking the site Procedure checklist: Completed Consent: Before the procedure and under the influence of no sedative(s), amnesic(s), or anxiolytics, the patient was informed of the treatment options, risks and possible complications. To fulfill our ethical and legal obligations, as recommended by the American Medical Association's Code of Ethics, I have informed the patient of my clinical  impression; the nature and purpose of the treatment or procedure; the risks, benefits, and possible complications of the intervention; the alternatives, including doing nothing; the risk(s) and benefit(s) of the alternative treatment(s)  or procedure(s); and the risk(s) and benefit(s) of doing nothing. The patient was provided information about the general risks and possible complications associated with the procedure. These may include, but are not limited to: failure to achieve desired goals, infection, bleeding, organ or nerve damage, allergic reactions, paralysis, and death. In addition, the patient was informed of those risks and complications associated to Spine-related procedures, such as failure to decrease pain; infection (i.e.: Meningitis, epidural or intraspinal abscess); bleeding (i.e.: epidural hematoma, subarachnoid hemorrhage, or any other type of intraspinal or peri-dural bleeding); organ or nerve damage (i.e.: Any type of peripheral nerve, nerve root, or spinal cord injury) with subsequent damage to sensory, motor, and/or autonomic systems, resulting in permanent pain, numbness, and/or weakness of one or several areas of the body; allergic reactions; (i.e.: anaphylactic reaction); and/or death. Furthermore, the patient was informed of those risks and complications associated with the medications. These include, but are not limited to: allergic reactions (i.e.: anaphylactic or anaphylactoid reaction(s)); adrenal axis suppression; blood sugar elevation that in diabetics may result in ketoacidosis or comma; water retention that in patients with history of congestive heart failure may result in shortness of breath, pulmonary edema, and decompensation with resultant heart failure; weight gain; swelling or edema; medication-induced neural toxicity; particulate matter embolism and blood vessel occlusion with resultant organ, and/or nervous system infarction; and/or aseptic necrosis of one or more  joints. Finally, the patient was informed that Medicine is not an exact science; therefore, there is also the possibility of unforeseen or unpredictable risks and/or possible complications that may result in a catastrophic outcome. The patient indicated having understood very clearly. We have given the patient no guarantees and we have made no promises. Enough time was given to the patient to ask questions, all of which were answered to the patient's satisfaction. Ms. Rodell has indicated that she wanted to continue with the procedure. Attestation: I, the ordering provider, attest that I have discussed with the patient the benefits, risks, side-effects, alternatives, likelihood of achieving goals, and potential problems during recovery for the procedure that I have provided informed consent. Date  Time: 01/11/2021  9:31 AM  Pre-Procedure Preparation:  Monitoring: As per clinic protocol. Respiration, ETCO2, SpO2, BP, heart rate and rhythm monitor placed and checked for adequate function Safety Precautions: Patient was assessed for positional comfort and pressure points before starting the procedure. Time-out: I initiated and conducted the "Time-out" before starting the procedure, as per protocol. The patient was asked to participate by confirming the accuracy of the "Time Out" information. Verification of the correct person, site, and procedure were performed and confirmed by me, the nursing staff, and the patient. "Time-out" conducted as per Joint Commission's Universal Protocol (UP.01.01.01). Time: 0957  Description of Procedure:          Laterality: Bilateral. The procedure was performed in identical fashion on both sides. Levels:  L2, L3, L4, L5, & S1 Medial Branch Level(s) Area Prepped: Posterior Lumbosacral Region DuraPrep (Iodine Povacrylex [0.7% available iodine] and Isopropyl Alcohol, 74% w/w) Safety Precautions: Aspiration looking for blood return was conducted prior to all injections. At no  point did we inject any substances, as a needle was being advanced. Before injecting, the patient was told to immediately notify me if she was experiencing any new onset of "ringing in the ears, or metallic taste in the mouth". No attempts were made at seeking any paresthesias. Safe injection practices and needle disposal techniques used. Medications properly checked for expiration dates. SDV (single dose vial) medications  used. After the completion of the procedure, all disposable equipment used was discarded in the proper designated medical waste containers. Local Anesthesia: Protocol guidelines were followed. The patient was positioned over the fluoroscopy table. The area was prepped in the usual manner. The time-out was completed. The target area was identified using fluoroscopy. A 12-in long, straight, sterile hemostat was used with fluoroscopic guidance to locate the targets for each level blocked. Once located, the skin was marked with an approved surgical skin marker. Once all sites were marked, the skin (epidermis, dermis, and hypodermis), as well as deeper tissues (fat, connective tissue and muscle) were infiltrated with a small amount of a short-acting local anesthetic, loaded on a 10cc syringe with a 25G, 1.5-in  Needle. An appropriate amount of time was allowed for local anesthetics to take effect before proceeding to the next step. Local Anesthetic: Lidocaine 2.0% The unused portion of the local anesthetic was discarded in the proper designated containers. Technical explanation of process:  L2 Medial Branch Nerve Block (MBB): The target area for the L2 medial branch is at the junction of the postero-lateral aspect of the superior articular process and the superior, posterior, and medial edge of the transverse process of L3. Under fluoroscopic guidance, a Quincke needle was inserted until contact was made with os over the superior postero-lateral aspect of the pedicular shadow (target area). After  negative aspiration for blood, 0.5 mL of the nerve block solution was injected without difficulty or complication. The needle was removed intact. L3 Medial Branch Nerve Block (MBB): The target area for the L3 medial branch is at the junction of the postero-lateral aspect of the superior articular process and the superior, posterior, and medial edge of the transverse process of L4. Under fluoroscopic guidance, a Quincke needle was inserted until contact was made with os over the superior postero-lateral aspect of the pedicular shadow (target area). After negative aspiration for blood, 0.5 mL of the nerve block solution was injected without difficulty or complication. The needle was removed intact. L4 Medial Branch Nerve Block (MBB): The target area for the L4 medial branch is at the junction of the postero-lateral aspect of the superior articular process and the superior, posterior, and medial edge of the transverse process of L5. Under fluoroscopic guidance, a Quincke needle was inserted until contact was made with os over the superior postero-lateral aspect of the pedicular shadow (target area). After negative aspiration for blood, 0.5 mL of the nerve block solution was injected without difficulty or complication. The needle was removed intact. L5 Medial Branch Nerve Block (MBB): The target area for the L5 medial branch is at the junction of the postero-lateral aspect of the superior articular process and the superior, posterior, and medial edge of the sacral ala. Under fluoroscopic guidance, a Quincke needle was inserted until contact was made with os over the superior postero-lateral aspect of the pedicular shadow (target area). After negative aspiration for blood, 0.5 mL of the nerve block solution was injected without difficulty or complication. The needle was removed intact. S1 Medial Branch Nerve Block (MBB): The target area for the S1 medial branch is at the posterior and inferior 6 o'clock position of  the L5-S1 facet joint. Under fluoroscopic guidance, the Quincke needle inserted for the L5 MBB was redirected until contact was made with os over the inferior and postero aspect of the sacrum, at the 6 o' clock position under the L5-S1 facet joint (Target area). After negative aspiration for blood, 0.5 mL of the nerve  block solution was injected without difficulty or complication. The needle was removed intact.  Nerve block solution: 0.2% PF-Ropivacaine + Triamcinolone (40 mg/mL) diluted to a final concentration of 4 mg of Triamcinolone/mL of Ropivacaine The unused portion of the solution was discarded in the proper designated containers. Procedural Needles: 22-gauge, 7-inch, Quincke needles used for all levels.  Once the entire procedure was completed, the treated area was cleaned, making sure to leave some of the prepping solution back to take advantage of its long term bactericidal properties.      Illustration of the posterior view of the lumbar spine and the posterior neural structures. Laminae of L2 through S1 are labeled. DPRL5, dorsal primary ramus of L5; DPRS1, dorsal primary ramus of S1; DPR3, dorsal primary ramus of L3; FJ, facet (zygapophyseal) joint L3-L4; I, inferior articular process of L4; LB1, lateral branch of dorsal primary ramus of L1; IAB, inferior articular branches from L3 medial branch (supplies L4-L5 facet joint); IBP, intermediate branch plexus; MB3, medial branch of dorsal primary ramus of L3; NR3, third lumbar nerve root; S, superior articular process of L5; SAB, superior articular branches from L4 (supplies L4-5 facet joint also); TP3, transverse process of L3.  Vitals:   01/11/21 1007 01/11/21 1017 01/11/21 1027 01/11/21 1039  BP: 115/67 109/67 109/68 129/74  Pulse:      Resp: 13 16 16 16   Temp:  (!) 97.2 F (36.2 C)  (!) 97.5 F (36.4 C)  TempSrc:  Temporal  Temporal  SpO2: 98% 92% 93% 94%  Weight:      Height:         Start Time: 0957 hrs. End Time: 1006  hrs.  Imaging Guidance (Spinal):          Type of Imaging Technique: Fluoroscopy Guidance (Spinal) Indication(s): Assistance in needle guidance and placement for procedures requiring needle placement in or near specific anatomical locations not easily accessible without such assistance. Exposure Time: Please see nurses notes. Contrast: None used. Fluoroscopic Guidance: I was personally present during the use of fluoroscopy. "Tunnel Vision Technique" used to obtain the best possible view of the target area. Parallax error corrected before commencing the procedure. "Direction-depth-direction" technique used to introduce the needle under continuous pulsed fluoroscopy. Once target was reached, antero-posterior, oblique, and lateral fluoroscopic projection used confirm needle placement in all planes. Images permanently stored in EMR. Interpretation: No contrast injected. I personally interpreted the imaging intraoperatively. Adequate needle placement confirmed in multiple planes. Permanent images saved into the patient's record.  Antibiotic Prophylaxis:   Anti-infectives (From admission, onward)   None     Indication(s): None identified  Post-operative Assessment:  Post-procedure Vital Signs:  Pulse/HCG Rate: 62(!) 58 Temp: (!) 97.5 F (36.4 C) Resp: 16 BP: 129/74 SpO2: 94 %  EBL: None  Complications: No immediate post-treatment complications observed by team, or reported by patient.  Note: The patient tolerated the entire procedure well. A repeat set of vitals were taken after the procedure and the patient was kept under observation following institutional policy, for this type of procedure. Post-procedural neurological assessment was performed, showing return to baseline, prior to discharge. The patient was provided with post-procedure discharge instructions, including a section on how to identify potential problems. Should any problems arise concerning this procedure, the patient was  given instructions to immediately contact us, at any time, without hesitation. In any case, we plan to contact the patient by telephone for a follow-up status report regarding this interventional procedure.  Comments:  No additional relevant information.  Plan of Care  Orders:  Orders Placed This Encounter  Procedures  . LUMBAR FACET(MEDIAL BRANCH NERVE BLOCK) MBNB    Scheduling Instructions:     Procedure: Lumbar facet block (AKA.: Lumbosacral medial branch nerve block)     Side: Bilateral     Level: L3-4, L4-5, & L5-S1 Facets (L2, L3, L4, L5, & S1 Medial Branch Nerves)     Sedation: Patient's choice.     Timeframe: Today    Order Specific Question:   Where will this procedure be performed?    Answer:   [facility]  . [facility] C-ARM 1-60 MIN NO REPORT    Intraoperative interpretation by procedural physician at [place].    Standing Status:   Standing    Number of Occurrences:   1    Order Specific Question:   Reason for exam:    Answer:   Assistance in needle guidance and placement for procedures requiring needle placement in or near specific anatomical locations not easily accessible without such assistance.  . Informed Consent Details: Physician/Practitioner Attestation; Transcribe to consent form and obtain patient signature    Nursing Order: Transcribe to consent form and obtain patient signature. Note: Always confirm laterality of pain with Ms. Bielicki, before procedure.    Order Specific Question:   Physician/Practitioner attestation of informed consent for procedure/surgical case    Answer:   I, the physician/practitioner, attest that I have discussed with the patient the benefits, risks, side effects, alternatives, likelihood of achieving goals and potential problems during recovery for the procedure that I have provided informed consent.    Order Specific Question:   Procedure    Answer:   Lumbar Facet Block  under fluoroscopic guidance    Order  Specific Question:   Physician/Practitioner performing the procedure    Answer:   Briar Sword A. Dossie Arbour MD    Order Specific Question:   Indication/Reason    Answer:   Low Back Pain, with our without leg pain, due to Facet Joint Arthralgia (Joint Pain) Spondylosis (Arthritis of the Spine), without myelopathy or radiculopathy (Nerve Damage).  . Provide equipment / supplies at bedside    "Block Tray" (Disposable  single use) Needle type: SpinalSpinal Amount/quantity: 4 Size: Long (7-inch) Gauge: 22G    Standing Status:   Standing    Number of Occurrences:   1    Order Specific Question:   Specify    Answer:   Block Tray   Chronic Opioid Analgesic:  Hydrocodone/homatropine solution #120 mL, 5 mL 4 times daily (20 MME/day) MME/day: 20 mg/day   Medications ordered for procedure: Meds ordered this encounter  Medications  . lidocaine (XYLOCAINE) 2 % (with pres) injection 400 mg  . lactated ringers infusion 1,000 mL  . midazolam (VERSED) 5 MG/5ML injection 1-2 mg    Make sure Flumazenil is available in the pyxis when using this medication. If oversedation occurs, administer 0.2 mg IV over 15 sec. If after 45 sec no response, administer 0.2 mg again over 1 min; may repeat at 1 min intervals; not to exceed 4 doses (1 mg)  . fentaNYL (SUBLIMAZE) injection 25-50 mcg    Make sure Narcan is available in the pyxis when using this medication. In the event of respiratory depression (RR< 8/min): Titrate NARCAN (naloxone) in increments of 0.1 to 0.2 mg IV at 2-3 minute intervals, until desired degree of reversal.  . ropivacaine (PF) 2 mg/mL (0.2%) (NAROPIN) injection 18 mL  . triamcinolone acetonide (KENALOG-40) injection 80  mg   Medications administered: We administered lidocaine, midazolam, fentaNYL, ropivacaine (PF) 2 mg/mL (0.2%), and triamcinolone acetonide.  See the medical record for exact dosing, route, and time of administration.  Follow-up plan:   Return in about 2 weeks (around 01/25/2021)  for on afternoon of procedure day, (VV), (PPE).      Interventional Therapies  Risk  Complexity Considerations:   Estimated body mass index is 47.65 kg/m as calculated from the following:   Height as of this encounter: 5\' 3"  (1.6 m).   Weight as of this encounter: 269 lb (122 kg). WNL   Planned  Pending:   Pending further evaluation   Under consideration:   Diagnostic right L4-5 LESI #2    Completed:   Diagnostic bilateral lumbar facet block x2 (01/11/2021)   Therapeutic  Palliative (PRN) options:   Palliative bilateral lumbar facet block #3     Recent Visits Date Type Provider Dept  10/23/20 Office Visit Milinda Pointer, MD [facility]  Showing recent visits within past 90 days and meeting all other requirements Today's Visits Date Type Provider Dept  01/11/21 Procedure visit Milinda Pointer, MD [facility]  Showing today's visits and meeting all other requirements Future Appointments Date Type Provider Dept  01/23/21 Appointment Milinda Pointer, MD [facility]  Showing future appointments within next 90 days and meeting all other requirements  Disposition: Discharge home  Discharge (Date  Time): 01/11/2021; 1040 hrs.   Primary Care Physician: Burnard Hawthorne, FNP Location: [facility] Note by: Gaspar Cola, MD Date: 01/11/2021; Time: 11:08 AM  Disclaimer:  Medicine is not an Chief Strategy Officer. The only guarantee in medicine is that nothing is guaranteed. It is important to note that the decision to proceed with this intervention was based on the information collected from the patient. The Data and conclusions were drawn from the patient's questionnaire, the interview, and the physical examination. Because the information was provided in large part by the patient, it cannot be guaranteed that it has not been purposely or unconsciously manipulated. Every effort has been made to obtain as much  relevant data as possible for this evaluation. It is important to note that the conclusions that lead to this procedure are derived in large part from the available data. Always take into account that the treatment will also be dependent on availability of resources and existing treatment guidelines, considered by other Pain Management Practitioners as being common knowledge and practice, at the time of the intervention. For Medico-Legal purposes, it is also important to point out that variation in procedural techniques and pharmacological choices are the acceptable norm. The indications, contraindications, technique, and results of the above procedure should only be interpreted and judged by a Board-Certified Interventional Pain Specialist with extensive familiarity and expertise in the same exact procedure and technique.

## 2021-01-11 ENCOUNTER — Telehealth: Payer: Self-pay

## 2021-01-11 ENCOUNTER — Ambulatory Visit (HOSPITAL_BASED_OUTPATIENT_CLINIC_OR_DEPARTMENT_OTHER): Payer: Medicare Other | Admitting: Pain Medicine

## 2021-01-11 ENCOUNTER — Ambulatory Visit
Admission: RE | Admit: 2021-01-11 | Discharge: 2021-01-11 | Disposition: A | Discharge status: Home / Self Care | Payer: Medicare Other | Source: Ambulatory Visit | Attending: Pain Medicine | Admitting: Pain Medicine

## 2021-01-11 ENCOUNTER — Other Ambulatory Visit: Payer: Self-pay

## 2021-01-11 ENCOUNTER — Other Ambulatory Visit
Admission: RE | Admit: 2021-01-11 | Discharge: 2021-01-11 | Disposition: A | Discharge status: Home / Self Care | Payer: Medicare Other | Source: Home / Self Care | Attending: Cardiology | Admitting: Cardiology

## 2021-01-11 ENCOUNTER — Encounter: Payer: Self-pay | Admitting: Pain Medicine

## 2021-01-11 VITALS — BP 129/74 | HR 62 | Temp 97.5°F | Resp 16 | Ht 63.0 in | Wt 269.0 lb

## 2021-01-11 DIAGNOSIS — G8929 Other chronic pain: Secondary | ICD-10-CM | POA: Insufficient documentation

## 2021-01-11 DIAGNOSIS — M545 Low back pain, unspecified: Secondary | ICD-10-CM

## 2021-01-11 DIAGNOSIS — M5137 Other intervertebral disc degeneration, lumbosacral region: Secondary | ICD-10-CM | POA: Insufficient documentation

## 2021-01-11 DIAGNOSIS — M4310 Spondylolisthesis, site unspecified: Secondary | ICD-10-CM | POA: Insufficient documentation

## 2021-01-11 DIAGNOSIS — R600 Localized edema: Secondary | ICD-10-CM

## 2021-01-11 DIAGNOSIS — M47816 Spondylosis without myelopathy or radiculopathy, lumbar region: Secondary | ICD-10-CM | POA: Insufficient documentation

## 2021-01-11 DIAGNOSIS — Z6841 Body Mass Index (BMI) 40.0 and over, adult: Secondary | ICD-10-CM

## 2021-01-11 DIAGNOSIS — M4316 Spondylolisthesis, lumbar region: Secondary | ICD-10-CM | POA: Diagnosis not present

## 2021-01-11 DIAGNOSIS — M47817 Spondylosis without myelopathy or radiculopathy, lumbosacral region: Secondary | ICD-10-CM

## 2021-01-11 DIAGNOSIS — M8938 Hypertrophy of bone, other site: Secondary | ICD-10-CM

## 2021-01-11 DIAGNOSIS — E876 Hypokalemia: Secondary | ICD-10-CM

## 2021-01-11 LAB — BASIC METABOLIC PANEL
Anion gap: 9 (ref 5–15)
BUN: 15 mg/dL (ref 8–23)
CO2: 26 mmol/L (ref 22–32)
Calcium: 9.1 mg/dL (ref 8.9–10.3)
Chloride: 105 mmol/L (ref 98–111)
Creatinine, Ser: 1.03 mg/dL — ABNORMAL HIGH (ref 0.44–1.00)
GFR, Estimated: 59 mL/min — ABNORMAL LOW (ref >60)
Glucose, Bld: 94 mg/dL (ref 70–99)
Potassium: 2.9 mmol/L — ABNORMAL LOW (ref 3.5–5.1)
Sodium: 140 mmol/L (ref 135–145)

## 2021-01-11 MED ORDER — MIDAZOLAM HCL 5 MG/5ML IJ SOLN
1.0000 mg | INTRAMUSCULAR | Status: DC | PRN
  Administered 2021-01-11: 3 mg via INTRAVENOUS
  Filled 2021-01-11: qty 5

## 2021-01-11 MED ORDER — LIDOCAINE HCL 2 % IJ SOLN
20.0000 mL | Freq: Once | INTRAMUSCULAR | Status: AC
  Administered 2021-01-11: 400 mg
  Filled 2021-01-11: qty 40

## 2021-01-11 MED ORDER — POTASSIUM CHLORIDE CRYS ER 20 MEQ PO TBCR: EXTENDED_RELEASE_TABLET | ORAL | 3 refills | Status: DC

## 2021-01-11 MED ORDER — LACTATED RINGERS IV SOLN: 1000.0000 mL | Freq: Once | INTRAVENOUS | Status: DC

## 2021-01-11 MED ORDER — FENTANYL CITRATE (PF) 100 MCG/2ML IJ SOLN
25.0000 ug | INTRAMUSCULAR | Status: DC | PRN
  Administered 2021-01-11: 100 ug via INTRAVENOUS
  Filled 2021-01-11: qty 2

## 2021-01-11 MED ORDER — ROPIVACAINE HCL 2 MG/ML IJ SOLN
18.0000 mL | Freq: Once | INTRAMUSCULAR | Status: AC
  Administered 2021-01-11: 18 mL via PERINEURAL
  Filled 2021-01-11: qty 20

## 2021-01-11 MED ORDER — TRIAMCINOLONE ACETONIDE 40 MG/ML IJ SUSP
80.0000 mg | Freq: Once | INTRAMUSCULAR | Status: AC
  Administered 2021-01-11: 80 mg
  Filled 2021-01-11: qty 2

## 2021-01-11 NOTE — Patient Instructions (Signed)

## 2021-01-11 NOTE — Telephone Encounter (Signed)
Called patient and gave her the below recommendations.  Patient verbalized understanding and agreed with plan.

## 2021-01-11 NOTE — Progress Notes (Signed)
Safety precautions to be maintained throughout the outpatient stay will include: orient to surroundings, keep bed in low position, maintain call bell within reach at all times, provide assistance with transfer out of bed and ambulation.  

## 2021-01-11 NOTE — Telephone Encounter (Signed)
-----   Message from Kate Sable, MD sent at 01/11/2021 11:20 AM EDT ----- Please advise patient that her potassium is low likely due to diuretic/water pill.  Start KCl 40 mEq daily x5 days, then decrease to 20 mEq daily.  Repeat potassium in 7 days, which will be 1 day before her follow-up appointment.  Thank you

## 2021-01-12 ENCOUNTER — Telehealth: Payer: Self-pay

## 2021-01-12 NOTE — Telephone Encounter (Signed)
Post procedure phone call. Patient states she is doing good.  

## 2021-01-15 ENCOUNTER — Encounter: Payer: Self-pay | Admitting: Family

## 2021-01-15 ENCOUNTER — Telehealth (INDEPENDENT_AMBULATORY_CARE_PROVIDER_SITE_OTHER): Payer: Medicare Other | Admitting: Family

## 2021-01-15 ENCOUNTER — Other Ambulatory Visit: Payer: Self-pay

## 2021-01-15 DIAGNOSIS — Z6841 Body Mass Index (BMI) 40.0 and over, adult: Secondary | ICD-10-CM | POA: Diagnosis not present

## 2021-01-15 DIAGNOSIS — N3281 Overactive bladder: Secondary | ICD-10-CM

## 2021-01-15 DIAGNOSIS — I10 Essential (primary) hypertension: Secondary | ICD-10-CM | POA: Diagnosis not present

## 2021-01-15 NOTE — Assessment & Plan Note (Addendum)
Overall controlled as reported by patient however advised that next visit with me must me in person so that I may assess.Continue losartan 50mg .  She will do trial stop of demadex, potassium and monitor for any escalation of BP or leg swelling.

## 2021-01-15 NOTE — Telephone Encounter (Signed)
Pt was able to have [facility] fill.

## 2021-01-15 NOTE — Telephone Encounter (Signed)
[facility] has transferred prescription.

## 2021-01-15 NOTE — Patient Instructions (Addendum)
Please trial stop demadex AND potassium chloride. If you do not have swelling in your legs, you may stop all together. Please monitor blood pressure during this as blood pressure may increase. Goal is less than 130/80

## 2021-01-15 NOTE — Progress Notes (Signed)
Virtual Visit via Video Note  I connected with@  on 01/16/21 at  4:00 PM EDT by a video enabled telemedicine application and verified that I am speaking with the correct person using two identifiers.  Location patient: home Location provider:work  Persons participating in the virtual visit: patient, provider  I discussed the limitations of evaluation and management by telemedicine and the availability of in person appointments. The patient expressed understanding and agreed to proceed.   HPI:  She has lost weight and very pleased. She has made aggressive  Lifestyle changes at home.  Started trulicity 0.75mg  last week. She has stopped drinking sweet drinks, fast food. No nausea, vomiting.   Planning to have left knee surgery with Dr Rudene Christians next month.  Bilateral leg swelling resolved. She is urinating every 10 minutes while on demadex. She would like to stop the demadex as she urinates right after she takes it.   Compliant with potassium chloride.   No sob, cp.  Echo left ventricular EF 60- 65% 12/2019  OAB- compliant with oxybutynin which has been helpful.  HTN- at home 128/78. Compliant with losartan 50mg .    ROS: See pertinent positives and negatives per HPI.    EXAM:  VITALS per patient if applicable: BP (!) 762/83   Ht 5\' 3"  (1.6 m)   Wt 269 lb (122 kg)   BMI 47.65 kg/m  BP Readings from Last 3 Encounters:  01/15/21 (!) 155/82  01/11/21 129/74  12/11/20 118/68   Wt Readings from Last 3 Encounters:  01/15/21 269 lb (122 kg)  01/11/21 269 lb (122 kg)  12/11/20 278 lb 8 oz (126.3 kg)    GENERAL: alert, oriented, appears well and in no acute distress  HEENT: atraumatic, conjunttiva clear, no obvious abnormalities on inspection of external nose and ears  NECK: normal movements of the head and neck  LUNGS: on inspection no signs of respiratory distress, breathing rate appears normal, no obvious gross SOB, gasping or wheezing  CV: no obvious cyanosis  MS: moves  all visible extremities without noticeable abnormality  PSYCH/NEURO: pleasant and cooperative, no obvious depression or anxiety, speech and thought processing grossly intact  ASSESSMENT AND PLAN:  Discussed the following assessment and plan:  Problem List Items Addressed This Visit      Cardiovascular and Mediastinum   Essential hypertension    Overall controlled as reported by patient however advised that next visit with me must me in person so that I may assess.Continue losartan 50mg .  She will do trial stop of demadex, potassium and monitor for any escalation of BP or leg swelling.        Genitourinary   OAB (overactive bladder)    Controlled. Continue oxybutynin 10mg . Pending repeat UA as last UA positive for RBCs.        Other   BMI 50.0-59.9, adult [facility])    Congratulated patient on weight loss. Continue  trulicity 0.75mg           -we discussed possible serious and likely etiologies, options for evaluation and workup, limitations of telemedicine visit vs in person visit, treatment, treatment risks and precautions. Pt prefers to treat via telemedicine empirically rather then risking or undertaking an in person visit at this moment.  .   I discussed the assessment and treatment plan with the patient. The patient was provided an opportunity to ask questions and all were answered. The patient agreed with the plan and demonstrated an understanding of the instructions.   The patient was advised to  call back or seek an in-person evaluation if the symptoms worsen or if the condition fails to improve as anticipated.   Mable Paris, FNP

## 2021-01-16 ENCOUNTER — Ambulatory Visit
Admission: RE | Admit: 2021-01-16 | Discharge: 2021-01-16 | Disposition: A | Discharge status: Home / Self Care | Payer: Medicare Other | Source: Ambulatory Visit | Attending: Orthopedic Surgery | Admitting: Orthopedic Surgery

## 2021-01-16 ENCOUNTER — Other Ambulatory Visit: Payer: Self-pay

## 2021-01-16 DIAGNOSIS — G8929 Other chronic pain: Secondary | ICD-10-CM | POA: Diagnosis not present

## 2021-01-16 DIAGNOSIS — M1712 Unilateral primary osteoarthritis, left knee: Secondary | ICD-10-CM | POA: Diagnosis not present

## 2021-01-16 DIAGNOSIS — R319 Hematuria, unspecified: Secondary | ICD-10-CM

## 2021-01-16 DIAGNOSIS — N3281 Overactive bladder: Secondary | ICD-10-CM | POA: Insufficient documentation

## 2021-01-16 DIAGNOSIS — M19072 Primary osteoarthritis, left ankle and foot: Secondary | ICD-10-CM | POA: Diagnosis not present

## 2021-01-16 DIAGNOSIS — M25462 Effusion, left knee: Secondary | ICD-10-CM | POA: Diagnosis not present

## 2021-01-16 NOTE — Progress Notes (Signed)
Pt scheduled to repeat UA Monday 4/11.

## 2021-01-16 NOTE — Progress Notes (Signed)
LMTCB

## 2021-01-16 NOTE — Assessment & Plan Note (Signed)
Congratulated patient on weight loss. Continue  trulicity 0.75mg 

## 2021-01-16 NOTE — Assessment & Plan Note (Signed)
Controlled. Continue oxybutynin 10mg . Pending repeat UA as last UA positive for RBCs.

## 2021-01-19 ENCOUNTER — Ambulatory Visit: Payer: Medicare Other | Admitting: Cardiology

## 2021-01-19 ENCOUNTER — Encounter: Payer: Self-pay | Admitting: Cardiology

## 2021-01-19 ENCOUNTER — Other Ambulatory Visit: Payer: Self-pay

## 2021-01-19 VITALS — BP 156/80 | HR 55 | Ht 63.0 in | Wt 272.0 lb

## 2021-01-19 DIAGNOSIS — R600 Localized edema: Secondary | ICD-10-CM | POA: Diagnosis not present

## 2021-01-19 DIAGNOSIS — I10 Essential (primary) hypertension: Secondary | ICD-10-CM | POA: Diagnosis not present

## 2021-01-19 MED ORDER — TORSEMIDE 20 MG PO TABS: 20.0000 mg | ORAL_TABLET | ORAL | 3 refills | Status: DC | PRN

## 2021-01-19 MED ORDER — HYDROCHLOROTHIAZIDE 25 MG PO TABS: 25.0000 mg | ORAL_TABLET | Freq: Every day | ORAL | 3 refills | Status: DC

## 2021-01-19 NOTE — Patient Instructions (Signed)
Medication Instructions:   Your physician has recommended you make the following change in your medication:   1.  START taking Hydrochlorothiazide 25 MG once a day.  2. START taking your Torsemide 20 MG only when needed for swelling.    *If you need a refill on your cardiac medications before your next appointment, please call your pharmacy*   Lab Work:  BMP to be drawn today     Testing/Procedures: None ordered   Follow-Up: At [facility], you and your health needs are our priority.  As part of our continuing mission to provide you with exceptional heart care, we have created designated Provider Care Teams.  These Care Teams include your primary Cardiologist (physician) and Advanced Practice Providers (APPs -  Physician Assistants and Nurse Practitioners) who all work together to provide you with the care you need, when you need it.  We recommend signing up for the patient portal called "MyChart".  Sign up information is provided on this After Visit Summary.  MyChart is used to connect with patients for Virtual Visits (Telemedicine).  Patients are able to view lab/test results, encounter notes, upcoming appointments, etc.  Non-urgent messages can be sent to your provider as well.   To learn more about what you can do with MyChart, go to [email or web address].    Your next appointment:   3 month(s)  The format for your next appointment:   In Person  Provider:   Kate Sable, MD   Other Instructions

## 2021-01-19 NOTE — Progress Notes (Signed)
Cardiology Office Note:    Date:  01/19/2021   ID:  Jacqueline Delgado, DOB December 05, 1950, MRN [id number]  PCP:  Burnard Hawthorne, FNP  Cardiologist:  Kate Sable, MD  Electrophysiologist:  None   Referring MD: Burnard Hawthorne, FNP   Chief Complaint  Patient presents with  . Other    2 week follow up - patient c.o chest pain- center of chest that radiates up her throat (thinks its acid reflux). Meds reviewed verbally with patient.     History of Present Illness:    Jacqueline Delgado is a 70 y.o. female with a hx of hypertension, obesity presents for follow-up.    She was previously seen due to lower extremity edema and hypertension.  Torsemide was started and symptoms were deemed secondary to morbid obesity.  Her symptoms of edema currently resolved with taking torsemide.  She obtain BMP and potassium was low at 2.9.  KCl 40 mg was prescribed x5 days.  She has not taken torsemide for the past 2 days due to frequent usage of the bathroom.  Has noticed her blood pressures became more elevated with stopping torsemide.  She complains today of chest discomfort radiating of her throat, symptoms are worse when she wakes up from sleep or when she is lays flat on the bed.  Primary care team is adjusting her reflux medicines.  Planning on having left knee surgery next month.  Prior notes Echocardiogram on 12/20/2019 showed normal systolic and diastolic function EF 60 to 65%.     Past Medical History:  Diagnosis Date  . Anginal pain ([place])     3/8-12/21/10  . Arthritis    Osteoarthritis in BLE knee  . Colon polyp   . GERD (gastroesophageal reflux disease)   . Hiatal hernia   . History of hiatal hernia   . Hypertension    controlled  . Obesity   . Reflux   . Sleep apnea    NO CPAP  . Small vessel disease [facility])     Past Surgical History:  Procedure Laterality Date  . ABDOMINAL HYSTERECTOMY     partial; per patient has left ovary  . CHOLECYSTECTOMY    . COLONOSCOPY W/ POLYPECTOMY      adenomatous colon polyp  . COLONOSCOPY WITH PROPOFOL N/A 12/10/2017   Procedure: COLONOSCOPY WITH PROPOFOL;  Surgeon: Manya Silvas, MD;  Location: [facility] ENDOSCOPY;  Service: Endoscopy;  Laterality: N/A;  . ESOPHAGOGASTRODUODENOSCOPY (EGD) WITH PROPOFOL N/A 10/11/2015   Procedure: ESOPHAGOGASTRODUODENOSCOPY (EGD) WITH PROPOFOL;  Surgeon: Manya Silvas, MD;  Location: [facility] ENDOSCOPY;  Service: Endoscopy;  Laterality: N/A;  . ESOPHAGOGASTRODUODENOSCOPY (EGD) WITH PROPOFOL N/A 12/10/2017   Procedure: ESOPHAGOGASTRODUODENOSCOPY (EGD) WITH PROPOFOL;  Surgeon: Manya Silvas, MD;  Location: [facility] ENDOSCOPY;  Service: Endoscopy;  Laterality: N/A;  . JOINT REPLACEMENT Right    knee, Oct 2012  . JOINT REPLACEMENT     hopefully getting left partial knee replacement in July 2016  . SAVORY DILATION N/A 10/11/2015   Procedure: SAVORY DILATION;  Surgeon: Manya Silvas, MD;  Location: [facility] ENDOSCOPY;  Service: Endoscopy;  Laterality: N/A;  . SHOULDER ARTHROSCOPY WITH ROTATOR CUFF REPAIR AND SUBACROMIAL DECOMPRESSION Right 06/14/2015   Procedure: SHOULDER ARTHROSCOPY WITH mini open ROTATOR CUFF REPAIR AND SUBACROMIAL DECOMPRESSION, release long head biceps tendon.;  Surgeon: Leanor Kail, MD;  Location: [facility] ORS;  Service: Orthopedics;  Laterality: Right;     Current Medications: Current Meds  Medication Sig  . ACIPHEX 20 MG tablet Take 1 tablet (  20 mg total) by mouth in the morning and at bedtime.  . Dulaglutide (TRULICITY) 1.06 YI/9.4WN SOPN Inject 0.75 mg into the skin once a week.  . famotidine (PEPCID) 40 MG tablet Take 40 mg by mouth at bedtime.  . hydrochlorothiazide (HYDRODIURIL) 25 MG tablet Take 1 tablet (25 mg total) by mouth daily.  Marland Kitchen losartan (COZAAR) 50 MG tablet TAKE 1 TABLET BY MOUTH EVERY DAY  . ondansetron (ZOFRAN ODT) 4 MG disintegrating tablet Take 1 tablet (4 mg total) by mouth every 8 (eight) hours as needed for nausea or vomiting.  Marland Kitchen oxybutynin (DITROPAN-XL) 10 MG 24  hr tablet Take 10 mg by mouth at bedtime.  . potassium chloride SA (KLOR-CON) 20 MEQ tablet Take 2 tabs (40 MEQ) by mouth for 5 days, then continue to take 1 tab (20 MEQ) by mouth once a day.  . traMADol (ULTRAM) 50 MG tablet Take 2 tablets (100 mg total) by mouth every 6 (six) hours as needed for severe pain. Must last 30 days  . [DISCONTINUED] torsemide (DEMADEX) 20 MG tablet Take 1 tablet (20 mg total) by mouth daily.     Allergies:   Patient has no known allergies.   Social History   Socioeconomic History  . Marital status: Single    Spouse name: Not on file  . Number of children: Not on file  . Years of education: Not on file  . Highest education level: Not on file  Occupational History  . Not on file  Tobacco Use  . Smoking status: Never Smoker  . Smokeless tobacco: Never Used  Vaping Use  . Vaping Use: Never used  Substance and Sexual Activity  . Alcohol use: No  . Drug use: No  . Sexual activity: Not on file  Other Topics Concern  . Not on file  Social History Narrative   Lost a son on march 8th 20+ years.    March through Mother's Day is a very hard time every year.    Works with [employer] until 09/2020   Social Determinants of Health   Financial Resource Strain: Not on file  Food Insecurity: No Food Insecurity  . Worried About Charity fundraiser in the Last Year: Never true  . Ran Out of Food in the Last Year: Never true  Transportation Needs: No Transportation Needs  . Lack of Transportation (Medical): No  . Lack of Transportation (Non-Medical): No  Physical Activity: Not on file  Stress: Not on file  Social Connections: Not on file     Family History: The patient's family history includes Alcoholism in an other family member; Arthritis in an other family member; Breast cancer in her maternal grandmother and another family member; Breast cancer (age of onset: 24) in her mother; Diabetes in an other family member; Heart disease in her father; Hyperlipidemia  in her father; Hypertension in her father; Stroke in her father. There is no history of Thyroid cancer.  ROS:   Please see the history of present illness.     All other systems reviewed and are negative.  EKGs/Labs/Other Studies Reviewed:    The following studies were reviewed today:   EKG:  EKG is  ordered today.  The ekg ordered today demonstrates sinus bradycardia.  Recent Labs: 04/17/2020: Hemoglobin 11.4; Platelets 215 05/22/2020: Magnesium 2.0 09/13/2020: ALT 30; TSH 1.480 01/11/2021: BUN 15; Creatinine, Ser 1.03; Potassium 2.9; Sodium 140  Recent Lipid Panel    Component Value Date/Time   CHOL 134 09/13/2020 0912  CHOL 135 06/10/2014 0411   TRIG 64 09/13/2020 0912   TRIG 63 06/10/2014 0411   HDL 38 (L) 09/13/2020 0912   HDL 36 (L) 06/10/2014 0411   CHOLHDL 3.5 09/13/2020 0912   VLDL 13 06/10/2014 0411   LDLCALC 83 09/13/2020 0912   LDLCALC 86 06/10/2014 0411    Physical Exam:    VS:  BP (!) 156/80 (BP Location: Right Arm, Patient Position: Sitting, Cuff Size: Large)   Pulse (!) 55   Ht 5\' 3"  (1.6 m)   Wt 272 lb (123.4 kg)   SpO2 96%   BMI 48.18 kg/m     Wt Readings from Last 3 Encounters:  01/19/21 272 lb (123.4 kg)  01/15/21 269 lb (122 kg)  01/11/21 269 lb (122 kg)     GEN:  Well nourished, well developed in no acute distress HEENT: Normal NECK: No JVD; No carotid bruits LYMPHATICS: No lymphadenopathy CARDIAC: RRR, no murmurs, rubs, gallops RESPIRATORY:  Clear to auscultation without rales, wheezing or rhonchi  ABDOMEN: Soft, non-tender, non-distended MUSCULOSKELETAL: 2+ edema at ankles; No deformity  SKIN: Warm and dry NEUROLOGIC:  Alert and oriented x 3 PSYCHIATRIC:  Normal affect   ASSESSMENT:    1. Edema of both lower extremities   2. Essential (primary) hypertension   3. Morbid obesity ([place])    PLAN:    In order of problems listed above:  1. Previous edema in lower extremities resolved.  Potassium is low.  Check BMP today.  Take  torsemide as needed for edema.  Continue KCl 20 mEq daily. 2. Hypertension, BP elevated.  Start HCTZ 25 mg daily, continue losartan. 3. Morbidly obese, low-calorie diet, advised.  Follow-up in 3 months   Medication Adjustments/Labs and Tests Ordered: Current medicines are reviewed at length with the patient today.  Concerns regarding medicines are outlined above.  Orders Placed This Encounter  Procedures  . Basic metabolic panel  . EKG 12-Lead   Meds ordered this encounter  Medications  . hydrochlorothiazide (HYDRODIURIL) 25 MG tablet    Sig: Take 1 tablet (25 mg total) by mouth daily.    Dispense:  90 tablet    Refill:  3  . torsemide (DEMADEX) 20 MG tablet    Sig: Take 1 tablet (20 mg total) by mouth as needed. for swelling    Dispense:  30 tablet    Refill:  3    Patient Instructions  Medication Instructions:   Your physician has recommended you make the following change in your medication:   1.  START taking Hydrochlorothiazide 25 MG once a day.  2. START taking your Torsemide 20 MG only when needed for swelling.    *If you need a refill on your cardiac medications before your next appointment, please call your pharmacy*   Lab Work:  BMP to be drawn today     Testing/Procedures: None ordered   Follow-Up: At [facility], you and your health needs are our priority.  As part of our continuing mission to provide you with exceptional heart care, we have created designated Provider Care Teams.  These Care Teams include your primary Cardiologist (physician) and Advanced Practice Providers (APPs -  Physician Assistants and Nurse Practitioners) who all work together to provide you with the care you need, when you need it.  We recommend signing up for the patient portal called "MyChart".  Sign up information is provided on this After Visit Summary.  MyChart is used to connect with patients for Virtual Visits (Telemedicine).  Patients are able to view lab/test  results, encounter notes, upcoming appointments, etc.  Non-urgent messages can be sent to your provider as well.   To learn more about what you can do with MyChart, go to [email or web address].    Your next appointment:   3 month(s)  The format for your next appointment:   In Person  Provider:   Kate Sable, MD   Other Instructions      Signed, Kate Sable, MD  01/19/2021 4:31 PM    [place]

## 2021-01-20 LAB — BASIC METABOLIC PANEL
BUN/Creatinine Ratio: 20 (ref 12–28)
BUN: 18 mg/dL (ref 8–27)
CO2: 19 mmol/L — ABNORMAL LOW (ref 20–29)
Calcium: 9.7 mg/dL (ref 8.7–10.3)
Chloride: 107 mmol/L — ABNORMAL HIGH (ref 96–106)
Creatinine, Ser: 0.92 mg/dL (ref 0.57–1.00)
Glucose: 77 mg/dL (ref 65–99)
Potassium: 4.4 mmol/L (ref 3.5–5.2)
Sodium: 142 mmol/L (ref 134–144)
eGFR: 68 mL/min/{1.73_m2} (ref >59)

## 2021-01-22 ENCOUNTER — Encounter: Payer: Self-pay | Admitting: Pain Medicine

## 2021-01-22 ENCOUNTER — Other Ambulatory Visit: Payer: Medicare Other

## 2021-01-22 NOTE — Progress Notes (Signed)
Patient: Jacqueline Delgado  Service Category: E/M  Provider: Gaspar Cola, MD  DOB: [birth year]  DOS: 01/23/2021  Location: Office  MRN: [id number]  Setting: Ambulatory outpatient  Referring Provider: Burnard Hawthorne, FNP  Type: Established Patient  Specialty: Interventional Pain Management  PCP: Burnard Hawthorne, FNP  Location: Remote location  Delivery: TeleHealth     Virtual Encounter - Pain Management PROVIDER NOTE: Information contained herein reflects review and annotations entered in association with encounter. Interpretation of such information and data should be left to medically-trained personnel. Information provided to patient can be located elsewhere in the medical record under "Patient Instructions". Document created using STT-dictation technology, any transcriptional errors that may result from process are unintentional.    Contact & Pharmacy Preferred: [phone] Home: [phone] (home) Mobile: [phone] (mobile) E-mail: [email or web address]  [id number] [id number]: [phone]: [phone] [place] [address] [address] [place]Phone: [phone]: [phone]  Pre-screening  Ms. Amsler offered "in-person" vs "virtual" encounter. She indicated preferring virtual for this encounter.   Reason COVID-19*  Social distancing based on CDC and AMA recommendations.   I contacted SLIVANA YERIANon 01/23/2021 via telephone.      I clearly identified myself as FGaspar Cola MD. I verified that I was speaking with the correct person using two identifiers (Name: SSUNJAI LEVANDOSKI and date of birth: [birth year].  Consent I sought verbal advanced consent from [name]. I informed Ms. Klink of possible security and privacy concerns, risks, and limitations  associated with providing "not-in-person" medical evaluation and management services. I also informed Ms. Mast of the availability of "in-person" appointments. Finally, I informed her that there would be a charge for the virtual visit and that she could be  personally, fully or partially, financially responsible for it. Ms. SBhatexpressed understanding and agreed to proceed.   Historic Elements   Ms. SMIGNON BECHLERis a 70y.o.y.o. year old, female patient evaluated today after our last contact on 01/11/2021. Ms. SBordas has a past medical history of Anginal pain (HS[place], Arthritis, Colon polyp, GERD (gastroesophageal reflux disease), Hiatal hernia, History of hiatal hernia, Hypertension, Obesity, Reflux, Sleep apnea, and Small vessel disease ([place]. She also  has a past surgical history that includes Joint replacement (Right); Joint replacement; Cholecystectomy; Abdominal hysterectomy; Shoulder arthroscopy with rotator cuff repair and subacromial decompression (Right, 06/14/2015); Esophagogastroduodenoscopy (egd) with propofol (N/A, 10/11/2015); Savory dilation (N/A, 10/11/2015); Colonoscopy w/ polypectomy; Esophagogastroduodenoscopy (egd) with propofol (N/A, 12/10/2017); and Colonoscopy with propofol (N/A, 12/10/2017). Ms. SGimpelhas a current medication list which includes the following prescription(s): aciphex, trulicity, famotidine, hydrochlorothiazide, losartan, ondansetron, oxybutynin, potassium chloride sa, torsemide, and tramadol. She  reports that she has never smoked. She has never used smokeless tobacco. She reports that she does not drink alcohol and does not use drugs. Ms. SBoardmanhas No Known Allergies.   HPI  Today, she is being contacted for both, medication management and a post-procedure assessment.  The patient indicates having done really well after the bilateral lumbar facet block.  She is currently enjoying an ongoing 80% relief of the pain.  She is still using the tramadol but she has dropped  its use significantly to only 2 tablets/day.  She refers that she still has quite a bit left and does  not need another prescription for the time being.  The patient was encouraged to give [place] a call if things worsen.  Post-Procedure Evaluation  Procedure (01/11/2021): Diagnostic bilateral lumbar facet block #2 under fluoroscopic guidance and IV sedation Pre-procedure pain level: 6/10 Post-procedure: 0/10 (100% relief)  Sedation: Sedation provided.  Effectiveness during initial hour after procedure(Ultra-Short Term Relief): 100 %.  Local anesthetic used: Long-acting (4-6 hours) Effectiveness: Defined as any analgesic benefit obtained secondary to the administration of local anesthetics. This carries significant diagnostic value as to the etiological location, or anatomical origin, of the pain. Duration of benefit is expected to coincide with the duration of the local anesthetic used.  Effectiveness during initial 4-6 hours after procedure(Short-Term Relief): 100 %.  Long-term benefit: Defined as any relief past the pharmacologic duration of the local anesthetics.  Effectiveness past the initial 6 hours after procedure(Long-Term Relief): 80 % (ongoing).  Current benefits: Defined as benefit that persist at this time.   Analgesia:  Ongoing 80% relief of the low back pain. Function: Ms. Laskowski reports improvement in function ROM: Ms. Buser reports improvement in ROM  Pharmacotherapy Assessment  Analgesic: Hydrocodone/homatropine solution #120 mL, 5 mL 4 times daily (20 MME/day) MME/day: 20 mg/day   Monitoring: [place] PMP: PDMP reviewed during this encounter.       Pharmacotherapy: No side-effects or adverse reactions reported. Compliance: No problems identified. Effectiveness: Clinically acceptable. Plan: Refer to "POC".  UDS:  Summary  Date Value Ref Range Status  05/22/2020 Note  Final    Comment:    ==================================================================== Compliance Drug  Analysis, Ur ==================================================================== Test                             Result       Flag       Units  Drug Present and Declared for Prescription Verification   Tramadol                       >3268        EXPECTED   ng/mg creat   O-Desmethyltramadol            2372         EXPECTED   ng/mg creat   N-Desmethyltramadol            1509         EXPECTED   ng/mg creat    Source of tramadol is a prescription medication. O-desmethyltramadol    and N-desmethyltramadol are expected metabolites of tramadol.    Bupropion                      PRESENT      EXPECTED   Hydroxybupropion               PRESENT      EXPECTED    Hydroxybupropion is an expected metabolite of bupropion.  Drug Present not Declared for Prescription Verification   Dextromethorphan               PRESENT      UNEXPECTED   Dextrorphan/Levorphanol        PRESENT      UNEXPECTED    Dextrorphan is an expected metabolite of dextromethorphan, an over-    the-counter or prescription cough suppressant. Dextrorphan cannot be    distinguished from the scheduled prescription medication levorphanol    by the method used for analysis.  ==================================================================== Test  Result    Flag   Units      Ref Range   Creatinine              153              mg/dL      >=20 ==================================================================== Declared Medications:  The flagging and interpretation on this report are based on the  following declared medications.  Unexpected results may arise from  inaccuracies in the declared medications.   **Note: The testing scope of this panel includes these medications:   Bupropion (Wellbutrin)  Tramadol (Ultram)   **Note: The testing scope of this panel does not include the  following reported medications:   Famotidine (Pepcid)  Hydrochlorothiazide (Hydrodiuril)  Losartan (Cozaar)  Meloxicam (Mobic)   Mirabegron (Myrbetriq)  Rabeprazole (Aciphex) ==================================================================== For clinical consultation, please call [phone]. ====================================================================     Laboratory Chemistry Profile   Renal Lab Results  Component Value Date   BUN 18 01/19/2021   CREATININE 0.92 01/19/2021   BCR 20 01/19/2021   GFR 79.49 09/21/2019   GFRAA 81 09/13/2020   GFRNONAA 59 (L) 01/11/2021     Hepatic Lab Results  Component Value Date   AST 19 09/13/2020   ALT 30 09/13/2020   ALBUMIN 3.8 09/13/2020   ALKPHOS 87 09/13/2020   LIPASE 36 10/12/2017     Electrolytes Lab Results  Component Value Date   NA 142 01/19/2021   K 4.4 01/19/2021   CL 107 (H) 01/19/2021   CALCIUM 9.7 01/19/2021   MG 2.0 05/22/2020     Bone Lab Results  Component Value Date   VD25OH 14.17 (L) 09/21/2019   25OHVITD1 18 (L) 05/22/2020   25OHVITD2 13 05/22/2020   25OHVITD3 5.3 05/22/2020     Inflammation (CRP: Acute Phase) (ESR: Chronic Phase) Lab Results  Component Value Date   CRP 15 (H) 05/22/2020   ESRSEDRATE 73 (H) 05/22/2020       Note: Above Lab results reviewed.  Imaging  CT KNEE LEFT WO CONTRAST CLINICAL DATA:  My knee protocol.  Chronic knee pain.  EXAM: CT OF THE left KNEE WITHOUT CONTRAST  TECHNIQUE: Multidetector CT imaging of the left knee was performed according to the standard protocol. Multiplanar CT image reconstructions were also generated.  COMPARISON:  Radiographs 05/22/2020  FINDINGS: Left hip: Axial images demonstrate normally located left femoral head. No significant hip joint degenerative changes or AVN. The visualized left hemipelvis appears intact.  Left knee: Moderate to advanced tricompartmental degenerative changes most significant in the medial compartment with significant joint space narrowing, osteophytic spurring and subchondral cystic change. No bone lesions, fractures or  osteochondral abnormalities. Small to moderate-sized knee joint effusion is noted.  Left ankle: Axial images demonstrate mild degenerative changes but no fracture or osteochondral lesion.  IMPRESSION: 1. Moderate to advanced tricompartmental degenerative changes involving the left knee joint, most significant in the medial compartment. 2. Small to moderate-sized knee joint effusion.  Electronically Signed   By: Marijo Sanes M.D.   On: 01/17/2021 13:06  Assessment  The primary encounter diagnosis was Lumbosacral facet syndrome (Bilateral) (R>L). Diagnoses of Chronic low back pain (Bilateral) (R>L) w/o sciatica, Chronic lower extremity pain (2ry area of Pain) (Bilateral) (R>L), Lumbar Grade 1 Anterolisthesis of L4/L5, and Chronic pain syndrome were also pertinent to this visit.  Plan of Care  Problem-specific:  No problem-specific Assessment & Plan notes found for this encounter.  Ms. IZORA BENN has a current medication list which includes the  following long-term medication(s): aciphex, famotidine, hydrochlorothiazide, losartan, potassium chloride sa, torsemide, and tramadol.  Pharmacotherapy (Medications Ordered): No orders of the defined types were placed in this encounter.  Orders:  No orders of the defined types were placed in this encounter.  Follow-up plan:   Return if symptoms worsen or fail to improve.      Interventional Therapies  Risk  Complexity Considerations:   Estimated body mass index is 47.65 kg/m as calculated from the following:   Height as of this encounter: '5\' 3"'  (1.6 m).   Weight as of this encounter: 269 lb (122 kg). WNL   Planned  Pending:   Pending further evaluation   Under consideration:   Diagnostic right L4-5 LESI #2    Completed:   Diagnostic bilateral lumbar facet block x2 (01/11/2021)   Therapeutic  Palliative (PRN) options:   Palliative bilateral lumbar facet block #3      Recent Visits Date Type Provider Dept  01/11/21  Procedure visit Milinda Pointer, MD [facility]  Showing recent visits within past 90 days and meeting all other requirements Today's Visits Date Type Provider Dept  01/23/21 Telemedicine Milinda Pointer, MD [facility]  Showing today's visits and meeting all other requirements Future Appointments No visits were found meeting these conditions. Showing future appointments within next 90 days and meeting all other requirements  I discussed the assessment and treatment plan with the patient. The patient was provided an opportunity to ask questions and all were answered. The patient agreed with the plan and demonstrated an understanding of the instructions.  Patient advised to call back or seek an in-person evaluation if the symptoms or condition worsens.  Duration of encounter: 12 minutes.  Note by: Gaspar Cola, MD Date: 01/23/2021; Time: 8:53 AM

## 2021-01-23 ENCOUNTER — Telehealth: Payer: Self-pay | Admitting: Pain Medicine

## 2021-01-23 ENCOUNTER — Ambulatory Visit: Payer: Medicare Other | Attending: Pain Medicine | Admitting: Pain Medicine

## 2021-01-23 ENCOUNTER — Other Ambulatory Visit: Payer: Self-pay

## 2021-01-23 DIAGNOSIS — M47817 Spondylosis without myelopathy or radiculopathy, lumbosacral region: Secondary | ICD-10-CM

## 2021-01-23 DIAGNOSIS — M79605 Pain in left leg: Secondary | ICD-10-CM | POA: Diagnosis not present

## 2021-01-23 DIAGNOSIS — M79604 Pain in right leg: Secondary | ICD-10-CM

## 2021-01-23 DIAGNOSIS — M545 Low back pain, unspecified: Secondary | ICD-10-CM | POA: Diagnosis not present

## 2021-01-23 DIAGNOSIS — G894 Chronic pain syndrome: Secondary | ICD-10-CM

## 2021-01-23 DIAGNOSIS — G8929 Other chronic pain: Secondary | ICD-10-CM | POA: Diagnosis not present

## 2021-01-23 DIAGNOSIS — M4310 Spondylolisthesis, site unspecified: Secondary | ICD-10-CM

## 2021-01-23 NOTE — Telephone Encounter (Signed)
Patient spoke with Dr. Dossie Arbour today in VV appt. She forgot to ask about the numbness and tingling in her right foot. Could he call her back for this quick ?

## 2021-02-02 DIAGNOSIS — U071 COVID-19: Secondary | ICD-10-CM | POA: Diagnosis not present

## 2021-02-02 DIAGNOSIS — Z7189 Other specified counseling: Secondary | ICD-10-CM | POA: Diagnosis not present

## 2021-02-05 ENCOUNTER — Telehealth: Payer: Self-pay | Admitting: Family

## 2021-02-05 DIAGNOSIS — R059 Cough, unspecified: Secondary | ICD-10-CM

## 2021-02-05 MED ORDER — BENZONATATE 100 MG PO CAPS: 100.0000 mg | ORAL_CAPSULE | Freq: Three times a day (TID) | ORAL | 0 refills | Status: DC | PRN

## 2021-02-05 NOTE — Telephone Encounter (Signed)
Call pt  I sent in [place] for cough If cough continues or she develops sob, fever, she would need an appt with [place] or urgent care

## 2021-02-05 NOTE — Telephone Encounter (Signed)
Pt scheduled Wednesday with Dr. Olivia Mackie. She has had a rapid covid test that was negative. Her PRC did not have enough cells to determine positive or negative. She said that she may have another PRC test done. I advised [facility] or Dr. Olivia Mackie may decide to order one here. I advised that worsening symptoms or SOB to please go to UC.  She already had some tessalon pearles & she stated that unfortunately the do nothing for her cough.

## 2021-02-05 NOTE — Telephone Encounter (Signed)
Patient has had a cough for weeks, patient did a covid test on Friday and it was negative. Patient is asking for [place] to prescribe something for the cough. Patient said at time of call she did not want an appointment.

## 2021-02-07 ENCOUNTER — Other Ambulatory Visit: Payer: Self-pay | Admitting: Orthopedic Surgery

## 2021-02-07 ENCOUNTER — Telehealth (INDEPENDENT_AMBULATORY_CARE_PROVIDER_SITE_OTHER): Payer: Medicare Other | Admitting: Internal Medicine

## 2021-02-07 ENCOUNTER — Other Ambulatory Visit: Payer: Self-pay

## 2021-02-07 ENCOUNTER — Encounter: Payer: Self-pay | Admitting: Internal Medicine

## 2021-02-07 VITALS — BP 127/80 | Ht 63.0 in | Wt 264.0 lb

## 2021-02-07 DIAGNOSIS — K219 Gastro-esophageal reflux disease without esophagitis: Secondary | ICD-10-CM | POA: Diagnosis not present

## 2021-02-07 DIAGNOSIS — J309 Allergic rhinitis, unspecified: Secondary | ICD-10-CM | POA: Diagnosis not present

## 2021-02-07 DIAGNOSIS — K224 Dyskinesia of esophagus: Secondary | ICD-10-CM

## 2021-02-07 DIAGNOSIS — R059 Cough, unspecified: Secondary | ICD-10-CM

## 2021-02-07 NOTE — Progress Notes (Signed)
Telephone  Note  I connected with Jacqueline Delgado  on 02/07/21 at 11:25 AM EDT by telephone and verified that I am speaking with the correct person using two identifiers.  Location patient: home, [place] Location provider:work or home office Persons participating in the virtual visit: patient, provider  I discussed the limitations of evaluation and management by telemedicine and the availability of in person appointments. The patient expressed understanding and agreed to proceed.   HPI: 1. Cough worse at night x 3 weeks negative rapid covid test having light green to clear nasal discharge and runny nose had h/o GERD on aciphex 20 mg bid, pepcid 40 mg qd. She is worried about esophageal spasms no sick contacts she is not a smoker, no asthma no known h/o allergies. She has been hoarse with deep voice recently and thinks sx's GI related   -COVID-19 vaccine status:   ROS: See pertinent positives and negatives per HPI.  Past Medical History:  Diagnosis Date  . Anginal pain ([place])     3/8-12/21/10  . Arthritis    Osteoarthritis in BLE knee  . Colon polyp   . GERD (gastroesophageal reflux disease)   . Hiatal hernia   . History of hiatal hernia   . Hypertension    controlled  . Obesity   . Reflux   . Sleep apnea    NO CPAP  . Small vessel disease [facility])     Past Surgical History:  Procedure Laterality Date  . ABDOMINAL HYSTERECTOMY     partial; per patient has left ovary  . CHOLECYSTECTOMY    . COLONOSCOPY W/ POLYPECTOMY     adenomatous colon polyp  . COLONOSCOPY WITH PROPOFOL N/A 12/10/2017   Procedure: COLONOSCOPY WITH PROPOFOL;  Surgeon: Manya Silvas, MD;  Location: [facility] ENDOSCOPY;  Service: Endoscopy;  Laterality: N/A;  . ESOPHAGOGASTRODUODENOSCOPY (EGD) WITH PROPOFOL N/A 10/11/2015   Procedure: ESOPHAGOGASTRODUODENOSCOPY (EGD) WITH PROPOFOL;  Surgeon: Manya Silvas, MD;  Location: [facility] ENDOSCOPY;  Service: Endoscopy;  Laterality: N/A;  . ESOPHAGOGASTRODUODENOSCOPY (EGD) WITH  PROPOFOL N/A 12/10/2017   Procedure: ESOPHAGOGASTRODUODENOSCOPY (EGD) WITH PROPOFOL;  Surgeon: Manya Silvas, MD;  Location: [facility] ENDOSCOPY;  Service: Endoscopy;  Laterality: N/A;  . JOINT REPLACEMENT Right    knee, Oct 2012  . JOINT REPLACEMENT     hopefully getting left partial knee replacement in July 2016  . SAVORY DILATION N/A 10/11/2015   Procedure: SAVORY DILATION;  Surgeon: Manya Silvas, MD;  Location: [facility] ENDOSCOPY;  Service: Endoscopy;  Laterality: N/A;  . SHOULDER ARTHROSCOPY WITH ROTATOR CUFF REPAIR AND SUBACROMIAL DECOMPRESSION Right 06/14/2015   Procedure: SHOULDER ARTHROSCOPY WITH mini open ROTATOR CUFF REPAIR AND SUBACROMIAL DECOMPRESSION, release long head biceps tendon.;  Surgeon: Leanor Kail, MD;  Location: [facility] ORS;  Service: Orthopedics;  Laterality: Right;     Current Outpatient Medications:  .  ACIPHEX 20 MG tablet, Take 1 tablet (20 mg total) by mouth in the morning and at bedtime., Disp: 60 tablet, Rfl: 5 .  benzonatate (TESSALON PERLES) 100 MG capsule, Take 1 capsule (100 mg total) by mouth 3 (three) times daily as needed for cough., Disp: 30 capsule, Rfl: 0 .  Dulaglutide (TRULICITY) 7.78 EU/2.3NT SOPN, Inject 0.75 mg into the skin once a week., Disp: 2 mL, Rfl: 2 .  famotidine (PEPCID) 40 MG tablet, Take 40 mg by mouth at bedtime., Disp: , Rfl:  .  fluticasone (FLONASE) 50 MCG/ACT nasal spray, Place 2 sprays into both nostrils daily as needed for allergies or rhinitis., Disp: ,  Rfl:  .  hydrochlorothiazide (HYDRODIURIL) 25 MG tablet, Take 1 tablet (25 mg total) by mouth daily., Disp: 90 tablet, Rfl: 3 .  losartan (COZAAR) 50 MG tablet, TAKE 1 TABLET BY MOUTH EVERY DAY, Disp: 90 tablet, Rfl: 1 .  ondansetron (ZOFRAN ODT) 4 MG disintegrating tablet, Take 1 tablet (4 mg total) by mouth every 8 (eight) hours as needed for nausea or vomiting., Disp: 30 tablet, Rfl: 1 .  oxybutynin (DITROPAN-XL) 10 MG 24 hr tablet, Take 10 mg by mouth at bedtime., Disp: , Rfl:   .  potassium chloride SA (KLOR-CON) 20 MEQ tablet, Take 2 tabs (40 MEQ) by mouth for 5 days, then continue to take 1 tab (20 MEQ) by mouth once a day., Disp: 90 tablet, Rfl: 3 .  torsemide (DEMADEX) 20 MG tablet, Take 1 tablet (20 mg total) by mouth as needed. for swelling (Patient not taking: Reported on 02/07/2021), Disp: 30 tablet, Rfl: 3 .  traMADol (ULTRAM) 50 MG tablet, Take 2 tablets (100 mg total) by mouth every 6 (six) hours as needed for severe pain. Must last 30 days (Patient not taking: Reported on 02/07/2021), Disp: 240 tablet, Rfl: 2  EXAM:  VITALS per patient if applicable:  GENERAL: alert, oriented, appears well and in no acute distress  PSYCH/NEURO: pleasant and cooperative, no obvious depression or anxiety, speech and thought processing grossly intact  ASSESSMENT AND PLAN:  Discussed the following assessment and plan:  Cough - Plan: Ambulatory referral to Gastroenterology Allergic rhinitis, unspecified seasonality, unspecified trigger Zyrtec, claritin, allegra, or xyzal at night  Nasal saline 2 sprays followed by flonase 2sprays or nasacort Mucinex or Robitussin DM   Gastroesophageal reflux disease without esophagitis - Plan: Ambulatory referral to Gastroenterology  Diffuse esophageal spasm - Plan: Ambulatory referral to Gastroenterology  -we discussed possible serious and likely etiologies, options for evaluation and workup, limitations of telemedicine visit vs in person visit, treatment, treatment risks and precautions.   I discussed the assessment and treatment plan with the patient. The patient was provided an opportunity to ask questions and all were answered. The patient agreed with the plan and demonstrated an understanding of the instructions.    Time spent 20 min Delorise Jackson, MD

## 2021-02-07 NOTE — Progress Notes (Signed)
Cough is worse at night and ongoing. Phlegm coming up and unsure of color. When blowing the nose it is light green.   Ongoing for 3 weeks. Covid test was negative, rapid test.   No history of allergies or sinus infections.

## 2021-02-07 NOTE — Patient Instructions (Addendum)
Zyrtec, claritin, allegra, or xyzal at night  Nasal saline 2 sprays followed by flonase 2sprays or nasacort Mucinex or Robitussin DM   Cough, Adult Coughing is a reflex that clears your throat and your airways (respiratory system). Coughing helps to heal and protect your lungs. It is normal to cough occasionally, but a cough that happens with other symptoms or lasts a long time may be a sign of a condition that needs treatment. An acute cough may only last 2-3 weeks, while a chronic cough may last 8 or more weeks. Coughing is commonly caused by:  Infection of the respiratory systemby viruses or bacteria.  Breathing in substances that irritate your lungs.  Allergies.  Asthma.  Mucus that runs down the back of your throat (postnasal drip).  Smoking.  Acid backing up from the stomach into the esophagus (gastroesophageal reflux).  Certain medicines.  Chronic lung problems.  Other medical conditions such as heart failure or a blood clot in the lung (pulmonary embolism). Follow these instructions at home: Medicines  Take over-the-counter and prescription medicines only as told by your health care provider.  Talk with your health care provider before you take a cough suppressant medicine. Lifestyle  Avoid cigarette smoke. Do not use any products that contain nicotine or tobacco, such as cigarettes, e-cigarettes, and chewing tobacco. If you need help quitting, ask your health care provider.  Drink enough fluid to keep your urine pale yellow.  Avoid caffeine.  Do not drink alcohol if your health care provider tells you not to drink.   General instructions  Pay close attention to changes in your cough. Tell your health care provider about them.  Always cover your mouth when you cough.  Avoid things that make you cough, such as perfume, candles, cleaning products, or campfire or tobacco smoke.  If the air is dry, use a cool mist vaporizer or humidifier in your bedroom or your  home to help loosen secretions.  If your cough is worse at night, try to sleep in a semi-upright position.  Rest as needed.  Keep all follow-up visits as told by your health care provider. This is important.   Contact a health care provider if you:  Have new symptoms.  Cough up pus.  Have a cough that does not get better after 2-3 weeks or gets worse.  Cannot control your cough with cough suppressant medicines and you are losing sleep.  Have pain that gets worse or pain that is not helped with medicine.  Have a fever.  Have unexplained weight loss.  Have night sweats. Get help right away if:  You cough up blood.  You have difficulty breathing.  Your heartbeat is very fast. These symptoms may represent a serious problem that is an emergency. Do not wait to see if the symptoms will go away. Get medical help right away. Call your local emergency services (911 in the [place] Do not drive yourself to the hospital. Summary  Coughing is a reflex that clears your throat and your airways. It is normal to cough occasionally, but a cough that happens with other symptoms or lasts a long time may be a sign of a condition that needs treatment.  Take over-the-counter and prescription medicines only as told by your health care provider.  Always cover your mouth when you cough.  Contact a health care provider if you have new symptoms or a cough that does not get better after 2-3 weeks or gets worse. This information is not intended to  replace advice given to you by your health care provider. Make sure you discuss any questions you have with your health care provider. Document Revised: 10/19/2018 Document Reviewed: 10/19/2018 Elsevier Patient Education  2021 [place].  Gastroesophageal Reflux Disease, Adult Gastroesophageal reflux (GER) happens when acid from the stomach flows up into the tube that connects the mouth and the stomach (esophagus). Normally, food travels down the  esophagus and stays in the stomach to be digested. However, when a person has GER, food and stomach acid sometimes move back up into the esophagus. If this becomes a more serious problem, the person may be diagnosed with a disease called gastroesophageal reflux disease (GERD). GERD occurs when the reflux:  Happens often.  Causes frequent or severe symptoms.  Causes problems such as damage to the esophagus. When stomach acid comes in contact with the esophagus, the acid may cause inflammation in the esophagus. Over time, GERD may create small holes (ulcers) in the lining of the esophagus. What are the causes? This condition is caused by a problem with the muscle between the esophagus and the stomach (lower esophageal sphincter, or LES). Normally, the LES muscle closes after food passes through the esophagus to the stomach. When the LES is weakened or abnormal, it does not close properly, and that allows food and stomach acid to go back up into the esophagus. The LES can be weakened by certain dietary substances, medicines, and medical conditions, including:  Tobacco use.  Pregnancy.  Having a hiatal hernia.  Alcohol use.  Certain foods and beverages, such as coffee, chocolate, onions, and peppermint. What increases the risk? You are more likely to develop this condition if you:  Have an increased body weight.  Have a connective tissue disorder.  Take NSAIDs, such as ibuprofen. What are the signs or symptoms? Symptoms of this condition include:  Heartburn.  Difficult or painful swallowing and the feeling of having a lump in the throat.  A bitter taste in the mouth.  Bad breath and having a large amount of saliva.  Having an upset or bloated stomach and belching.  Chest pain. Different conditions can cause chest pain. Make sure you see your health care provider if you experience chest pain.  Shortness of breath or wheezing.  Ongoing (chronic) cough or a nighttime  cough.  Wearing away of tooth enamel.  Weight loss. How is this diagnosed? This condition may be diagnosed based on a medical history and a physical exam. To determine if you have mild or severe GERD, your health care provider may also monitor how you respond to treatment. You may also have tests, including:  A test to examine your stomach and esophagus with a small camera (endoscopy).  A test that measures the acidity level in your esophagus.  A test that measures how much pressure is on your esophagus.  A barium swallow or modified barium swallow test to show the shape, size, and functioning of your esophagus. How is this treated? Treatment for this condition may vary depending on how severe your symptoms are. Your health care provider may recommend:  Changes to your diet.  Medicine.  Surgery. The goal of treatment is to help relieve your symptoms and to prevent complications. Follow these instructions at home: Eating and drinking  Follow a diet as recommended by your health care provider. This may involve avoiding foods and drinks such as: ? Coffee and tea, with or without caffeine. ? Drinks that contain alcohol. ? Energy drinks and sports drinks. ?  Carbonated drinks or sodas. ? Chocolate and cocoa. ? Peppermint and mint flavorings. ? Garlic and onions. ? Horseradish. ? Spicy and acidic foods, including peppers, chili powder, curry powder, vinegar, hot sauces, and barbecue sauce. ? Citrus fruit juices and citrus fruits, such as oranges, lemons, and limes. ? Tomato-based foods, such as red sauce, chili, salsa, and pizza with red sauce. ? Fried and fatty foods, such as donuts, french fries, potato chips, and high-fat dressings. ? High-fat meats, such as hot dogs and fatty cuts of red and white meats, such as rib eye steak, sausage, ham, and bacon. ? High-fat dairy items, such as whole milk, butter, and cream cheese.  Eat small, frequent meals instead of large  meals.  Avoid drinking large amounts of liquid with your meals.  Avoid eating meals during the 2-3 hours before bedtime.  Avoid lying down right after you eat.  Do not exercise right after you eat.   Lifestyle  Do not use any products that contain nicotine or tobacco. These products include cigarettes, chewing tobacco, and vaping devices, such as e-cigarettes. If you need help quitting, ask your health care provider.  Try to reduce your stress by using methods such as yoga or meditation. If you need help reducing stress, ask your health care provider.  If you are overweight, reduce your weight to an amount that is healthy for you. Ask your health care provider for guidance about a safe weight loss goal.   General instructions  Pay attention to any changes in your symptoms.  Take over-the-counter and prescription medicines only as told by your health care provider. Do not take aspirin, ibuprofen, or other NSAIDs unless your health care provider told you to take these medicines.  Wear loose-fitting clothing. Do not wear anything tight around your waist that causes pressure on your abdomen.  Raise (elevate) the head of your bed about 6 inches (15 cm). You can use a wedge to do this.  Avoid bending over if this makes your symptoms worse.  Keep all follow-up visits. This is important. Contact a health care provider if:  You have: ? New symptoms. ? Unexplained weight loss. ? Difficulty swallowing or it hurts to swallow. ? Wheezing or a persistent cough. ? A hoarse voice.  Your symptoms do not improve with treatment. Get help right away if:  You have sudden pain in your arms, neck, jaw, teeth, or back.  You suddenly feel sweaty, dizzy, or light-headed.  You have chest pain or shortness of breath.  You vomit and the vomit is green, yellow, or black, or it looks like blood or coffee grounds.  You faint.  You have stool that is red, bloody, or black.  You cannot swallow,  drink, or eat. These symptoms may represent a serious problem that is an emergency. Do not wait to see if the symptoms will go away. Get medical help right away. Call your local emergency services (911 in the [place] Do not drive yourself to the hospital. Summary  Gastroesophageal reflux happens when acid from the stomach flows up into the esophagus. GERD is a disease in which the reflux happens often, causes frequent or severe symptoms, or causes problems such as damage to the esophagus.  Treatment for this condition may vary depending on how severe your symptoms are. Your health care provider may recommend diet and lifestyle changes, medicine, or surgery.  Contact a health care provider if you have new or worsening symptoms.  Take over-the-counter and prescription medicines only as  told by your health care provider. Do not take aspirin, ibuprofen, or other NSAIDs unless your health care provider told you to do so.  Keep all follow-up visits as told by your health care provider. This is important. This information is not intended to replace advice given to you by your health care provider. Make sure you discuss any questions you have with your health care provider. Document Revised: 04/10/2020 Document Reviewed: 04/10/2020 Elsevier Patient Education  2021 [place] for Gastroesophageal Reflux Disease, Adult When you have gastroesophageal reflux disease (GERD), the foods you eat and your eating habits are very important. Choosing the right foods can help ease the discomfort of GERD. Consider working with a dietitian to help you make healthy food choices. What are tips for following this plan? Reading food labels  Look for foods that are low in saturated fat. Foods that have less than 5% of daily value (DV) of fat and 0 g of trans fats may help with your symptoms. Cooking  Cook foods using methods other than frying. This may include baking, steaming, grilling, or broiling.  These are all methods that do not need a lot of fat for cooking.  To add flavor, try to use herbs that are low in spice and acidity. Meal planning  Choose healthy foods that are low in fat, such as fruits, vegetables, whole grains, low-fat dairy products, lean meats, fish, and poultry.  Eat frequent, small meals instead of three large meals each day. Eat your meals slowly, in a relaxed setting. Avoid bending over or lying down until 2-3 hours after eating.  Limit high-fat foods such as fatty meats or fried foods.  Limit your intake of fatty foods, such as oils, butter, and shortening.  Avoid the following as told by your health care provider: ? Foods that cause symptoms. These may be different for different people. Keep a food diary to keep track of foods that cause symptoms. ? Alcohol. ? Drinking large amounts of liquid with meals. ? Eating meals during the 2-3 hours before bed.   Lifestyle  Maintain a healthy weight. Ask your health care provider what weight is healthy for you. If you need to lose weight, work with your health care provider to do so safely.  Exercise for at least 30 minutes on 5 or more days each week, or as told by your health care provider.  Avoid wearing clothes that fit tightly around your waist and chest.  Do not use any products that contain nicotine or tobacco. These products include cigarettes, chewing tobacco, and vaping devices, such as e-cigarettes. If you need help quitting, ask your health care provider.  Sleep with the head of your bed raised. Use a wedge under the mattress or blocks under the bed frame to raise the head of the bed.  Chew sugar-free gum after mealtimes. What foods should I eat? Eat a healthy, well-balanced diet of fruits, vegetables, whole grains, low-fat dairy products, lean meats, fish, and poultry. Each person is different. Foods that may trigger symptoms in one person may not trigger any symptoms in another person. Work with your  health care provider to identify foods that are safe for you. The items listed above may not be a complete list of recommended foods and beverages. Contact a dietitian for more information.   What foods should I avoid? Limiting some of these foods may help manage the symptoms of GERD. Everyone is different. Consult a dietitian or your health care provider to  help you identify the exact foods to avoid, if any. Fruits Any fruits prepared with added fat. Any fruits that cause symptoms. For some people this may include citrus fruits, such as oranges, grapefruit, pineapple, and lemons. Vegetables Deep-fried vegetables. [place] fries. Any vegetables prepared with added fat. Any vegetables that cause symptoms. For some people, this may include tomatoes and tomato products, chili peppers, onions and garlic, and horseradish. Grains Pastries or quick breads with added fat. Meats and other proteins High-fat meats, such as fatty beef or pork, hot dogs, ribs, ham, sausage, salami, and bacon. Fried meat or protein, including fried fish and fried chicken. Nuts and nut butters, in large amounts. Dairy Whole milk and chocolate milk. Sour cream. Cream. Ice cream. Cream cheese. Milkshakes. Fats and oils Butter. Margarine. Shortening. Ghee. Beverages Coffee and tea, with or without caffeine. Carbonated beverages. Sodas. Energy drinks. Fruit juice made with acidic fruits, such as orange or grapefruit. Tomato juice. Alcoholic drinks. Sweets and desserts Chocolate and cocoa. Donuts. Seasonings and condiments Pepper. Peppermint and spearmint. Added salt. Any condiments, herbs, or seasonings that cause symptoms. For some people, this may include curry, hot sauce, or vinegar-based salad dressings. The items listed above may not be a complete list of foods and beverages to avoid. Contact a dietitian for more information. Questions to ask your health care provider Diet and lifestyle changes are usually the first steps  that are taken to manage symptoms of GERD. If diet and lifestyle changes do not improve your symptoms, talk with your health care provider about taking medicines. Where to find more information  [employer]intestinal [employer]: aboutgerd.org Summary  When you have gastroesophageal reflux disease (GERD), food and lifestyle choices may be very helpful in easing the discomfort of GERD.  Eat frequent, small meals instead of three large meals each day. Eat your meals slowly, in a relaxed setting. Avoid bending over or lying down until 2-3 hours after eating.  Limit high-fat foods such as fatty meats or fried foods. This information is not intended to replace advice given to you by your health care provider. Make sure you discuss any questions you have with your health care provider. Document Revised: 04/10/2020 Document Reviewed: 04/10/2020 Elsevier Patient Education  [place].

## 2021-02-21 ENCOUNTER — Encounter: Payer: Self-pay | Admitting: Adult Health

## 2021-02-21 ENCOUNTER — Telehealth (INDEPENDENT_AMBULATORY_CARE_PROVIDER_SITE_OTHER): Payer: Medicare Other | Admitting: Adult Health

## 2021-02-21 VITALS — Ht 62.99 in | Wt 262.0 lb

## 2021-02-21 DIAGNOSIS — R059 Cough, unspecified: Secondary | ICD-10-CM

## 2021-02-21 DIAGNOSIS — R0981 Nasal congestion: Secondary | ICD-10-CM | POA: Diagnosis not present

## 2021-02-21 DIAGNOSIS — K219 Gastro-esophageal reflux disease without esophagitis: Secondary | ICD-10-CM

## 2021-02-21 NOTE — Patient Instructions (Addendum)
Call your gastroenterologist today 02/21/21 to obtain a sooner in person appointment. Likely need endoscopy./   Start Zyrtec 10 mg by mouth once daily. If causes drowsiness ok to do at night. Continue Flonase nasal spray.   Ok to covid test again.   Labs and x ray are ordered for [facility] for today, the sooner the better.   Seek in person care immediately if any symptoms worsening at anytime.   Food Choices for Gastroesophageal Reflux Disease, Adult When you have gastroesophageal reflux disease (GERD), the foods you eat and your eating habits are very important. Choosing the right foods can help ease your discomfort. Think about working with a food expert (dietitian) to help you make good choices. What are tips for following this plan? Reading food labels  Look for foods that are low in saturated fat. Foods that may help with your symptoms include: ? Foods that have less than 5% of daily value (DV) of fat. ? Foods that have 0 grams of trans fat. Cooking  Do not fry your food.  Cook your food by baking, steaming, grilling, or broiling. These are all methods that do not need a lot of fat for cooking.  To add flavor, try to use herbs that are low in spice and acidity. Meal planning  Choose healthy foods that are low in fat, such as: ? Fruits and vegetables. ? Whole grains. ? Low-fat dairy products. ? Lean meats, fish, and poultry.  Eat small meals often instead of eating 3 large meals each day. Eat your meals slowly in a place where you are relaxed. Avoid bending over or lying down until 2-3 hours after eating.  Limit high-fat foods such as fatty meats or fried foods.  Limit your intake of fatty foods, such as oils, butter, and shortening.  Avoid the following as told by your doctor: ? Foods that cause symptoms. These may be different for different people. Keep a food diary to keep track of foods that cause symptoms. ? Alcohol. ? Drinking a lot of liquid  with meals. ? Eating meals during the 2-3 hours before bed.   Lifestyle  Stay at a healthy weight. Ask your doctor what weight is healthy for you. If you need to lose weight, work with your doctor to do so safely.  Exercise for at least 30 minutes on 5 or more days each week, or as told by your doctor.  Wear loose-fitting clothes.  Do not smoke or use any products that contain nicotine or tobacco. If you need help quitting, ask your doctor.  Sleep with the head of your bed higher than your feet. Use a wedge under the mattress or blocks under the bed frame to raise the head of the bed.  Chew sugar-free gum after meals. What foods should eat? Eat a healthy, well-balanced diet of fruits, vegetables, whole grains, low-fat dairy products, lean meats, fish, and poultry. Each person is different. Foods that may cause symptoms in one person may not cause any symptoms in another person. Work with your doctor to find foods that are safe for you. The items listed above may not be a complete list of what you can eat and drink. Contact a food expert for more options.   What foods should I avoid? Limiting some of these foods may help in managing the symptoms of GERD. Everyone is different. Talk with a food expert or your doctor to help you find the exact foods to avoid, if any. Fruits Any  fruits prepared with added fat. Any fruits that cause symptoms. For some people, this may include citrus fruits, such as oranges, grapefruit, pineapple, and lemons. Vegetables Deep-fried vegetables. [place] fries. Any vegetables prepared with added fat. Any vegetables that cause symptoms. For some people, this may include tomatoes and tomato products, chili peppers, onions and garlic, and horseradish. Grains Pastries or quick breads with added fat. Meats and other proteins High-fat meats, such as fatty beef or pork, hot dogs, ribs, ham, sausage, salami, and bacon. Fried meat or protein, including fried fish and fried  chicken. Nuts and nut butters, in large amounts. Dairy Whole milk and chocolate milk. Sour cream. Cream. Ice cream. Cream cheese. Milkshakes. Fats and oils Butter. Margarine. Shortening. Ghee. Beverages Coffee and tea, with or without caffeine. Carbonated beverages. Sodas. Energy drinks. Fruit juice made with acidic fruits, such as orange or grapefruit. Tomato juice. Alcoholic drinks. Sweets and desserts Chocolate and cocoa. Donuts. Seasonings and condiments Pepper. Peppermint and spearmint. Added salt. Any condiments, herbs, or seasonings that cause symptoms. For some people, this may include curry, hot sauce, or vinegar-based salad dressings. The items listed above may not be a complete list of what you should not eat and drink. Contact a food expert for more options. Questions to ask your doctor Diet and lifestyle changes are often the first steps that are taken to manage symptoms of GERD. If diet and lifestyle changes do not help, talk with your doctor about taking medicines. Where to find more information  International Foundation for Gastrointestinal Disorders: aboutgerd.org Summary  When you have GERD, food and lifestyle choices are very important in easing your symptoms.  Eat small meals often instead of 3 large meals a day. Eat your meals slowly and in a place where you are relaxed.  Avoid bending over or lying down until 2-3 hours after eating.  Limit high-fat foods such as fatty meats or fried foods. This information is not intended to replace advice given to you by your health care provider. Make sure you discuss any questions you have with your health care provider. Document Revised: 04/10/2020 Document Reviewed: 04/10/2020 Elsevier Patient Education  2021 [place] [employer]; [employer] Respiratory Medicine (7th ed., pp. [phone]). Elsevier.">  Postnasal Drip Postnasal drip is the feeling of mucus going down the back of your throat. Mucus is a slimy  substance that moistens and cleans your nose and throat, as well as the air pockets in face bones near your forehead and cheeks (sinuses). Small amounts of mucus pass from your nose and sinuses down the back of your throat all the time. This is normal. When you produce too much mucus or the mucus gets too thick, you can feel it. Some common causes of postnasal drip include:  Having more mucus because of: ? A cold or the flu. ? Allergies. ? Cold air. ? Certain medicines.  Having more mucus that is thicker because of: ? A sinus or nasal infection. ? Dry air. ? A food allergy. Follow these instructions at home: Relieving discomfort  Gargle with a salt-water mixture 3-4 times a day or as needed. To make a salt-water mixture, completely dissolve -1 tsp of salt in 1 cup of warm water.  If the air in your home is dry, use a humidifier to add moisture to the air.  Use a saline spray or container (neti pot) to flush out the nose (nasal irrigation). These methods can help clear away mucus and keep the nasal passages moist.  General instructions  Take over-the-counter and prescription medicines only as told by your health care provider.  Follow instructions from your health care provider about eating or drinking restrictions. You may need to avoid caffeine.  Avoid things that you know you are allergic to (allergens), like dust, mold, pollen, pets, or certain foods.  Drink enough fluid to keep your urine pale yellow.  Keep all follow-up visits as told by your health care provider. This is important. Contact a health care provider if:  You have a fever.  You have a sore throat.  You have difficulty swallowing.  You have headache.  You have sinus pain.  You have a cough that does not go away.  The mucus from your nose becomes thick and is green or yellow in color.  You have cold or flu symptoms that last more than 10 days. Summary  Postnasal drip is the feeling of mucus going  down the back of your throat.  If your health care provider approves, use nasal irrigation or a nasal spray 2?4 times a day.  Avoid things that you know you are allergic to (allergens), like dust, mold, pollen, pets, or certain foods. This information is not intended to replace advice given to you by your health care provider. Make sure you discuss any questions you have with your health care provider. Document Revised: 07/11/2020 Document Reviewed: 07/11/2020 Elsevier Patient Education  2021 [employer].

## 2021-02-21 NOTE — Progress Notes (Signed)
Telephone via Video Note  I connected with Jacqueline Delgado on 02/21/21 at  1:30 PM EDT by a video enabled telemedicine application and verified that I am speaking with the correct person using two identifiers. Parties involved in visit as below:   Location: Patient: at home Provider: Provider: Provider's office at  [facility], [place].      I discussed the limitations of evaluation and management by telemedicine and the availability of in person appointments. The patient expressed understanding and agreed to proceed.  History of Present Illness: Patient reports the cough has been present over one month ago. She has woke up with some hoarseness, sore throat and lots of post nasal drip.  Cough was worse last week, waking up at 3 am and she starts a coughing fit. Phlegm in throat " all the time". Denies any problems swallowing. She does  experience dry mouth on Ditropan.  Her biggest concern ' is squirrels have been living in my attack and I hope they have nothing to do". She denies any contact or bites from the squirrels.   One week ago she had Covid rapid done and was negative and send off was rejected due to not enough specimen.   not covid vaccinated.  Denies any edema at this time.  Reports productive cough with clear sputum at time. Denies any distress or shortness of breath.  She has no pulse oximetry and no vital signs available. Telephone encounter with gastroenterology - suspected and she is on aciphex as directed and Pepcid at night. Abby Potash PA at [facility].   Patient  denies any fever, body aches,chills, rash, chest pain, shortness of breath, nausea, vomiting, or diarrhea.  Denies dizziness, lightheadedness, pre syncopal or syncopal episodes.   History of pneumonia 04/17/2020    saw Dr. Olivia Mackie on  02/07/21 plan copied in for continuity of care:  Cough - Plan: Ambulatory referral to Gastroenterology Allergic rhinitis, unspecified  seasonality, unspecified trigger Zyrtec, claritin, allegra, or xyzal at night  Nasal saline 2 sprays followed by flonase 2sprays or nasacort Mucinex or Robitussin DM  Observations/Objective:  Patient is alert and oriented and responsive to questions Engages in conversation with provider. Speaks in full sentences without any pauses without any shortness of breath or distress.   Assessment and Plan: 1. Cough Given the amount of post nasal drip she is describing would advise that she start zyrtec 10 mg po qd to see if post nasal drip improves if allergic response.  Also given her history of pneumonia last year,advise her going to medical mall for chest x ray and following labs. Will check BNP as well, no edema reported however since this is telephone visit feel better checking.  - DG Chest 2 View - CBC with Differential/Platelet; Future - Comprehensive metabolic panel; Future - B Nat Peptide; Future  2. Nasal congestion Zyrtec to start as above directed.  Also continue Flonase.   3. Gastroesophageal reflux disease, unspecified whether esophagitis present She has a gastroenterologist however she has not seen them in person, she is advised to call them today 02/21/21 and let them know that her reflux does not seem controlled and consult regarding a endoscopy likely needed.  She agrees to call today and let us know if any concerns. She has an appointment in June for follow up but since symptoms not improving recommend sooner follow up.    Orders Placed This Encounter  Procedures  . DG Chest 2 View  . CBC with  Differential/Platelet  . Comprehensive metabolic panel  . B Nat Peptide   Keep regular follow up's with PCP for chronic care and acute visits as needed.  Follow Up Instructions: Advised in person evaluation at anytime is advised if any symptoms do not improve, worsen or change at any given time.  Red Flags discussed. The patient was given clear instructions to go to ER or return to  medical center if any red flags develop, symptoms do not improve, worsen or new problems develop. They verbalized understanding.   I discussed the assessment and treatment plan with the patient. The patient was provided an opportunity to ask questions and all were answered. The patient agreed with the plan and demonstrated an understanding of the instructions.   The patient was advised to call back or seek an in-person evaluation if the symptoms worsen or if the condition fails to improve as anticipated.  I provided 30 minutes of non-face-to-face time during this encounter.   Marcille Buffy, FNP

## 2021-02-26 ENCOUNTER — Other Ambulatory Visit: Payer: Self-pay

## 2021-02-26 ENCOUNTER — Other Ambulatory Visit
Admission: RE | Admit: 2021-02-26 | Discharge: 2021-02-26 | Disposition: A | Discharge status: Home / Self Care | Payer: Medicare Other | Source: Ambulatory Visit | Attending: Orthopedic Surgery | Admitting: Orthopedic Surgery

## 2021-02-26 DIAGNOSIS — R059 Cough, unspecified: Secondary | ICD-10-CM | POA: Diagnosis not present

## 2021-02-26 HISTORY — DX: Anemia, unspecified: D64.9

## 2021-02-26 HISTORY — DX: Personal history of Methicillin resistant Staphylococcus aureus infection: Z86.14

## 2021-02-26 LAB — URINALYSIS, ROUTINE W REFLEX MICROSCOPIC
Bilirubin Urine: NEGATIVE
Glucose, UA: NEGATIVE mg/dL
Hgb urine dipstick: NEGATIVE
Ketones, ur: NEGATIVE mg/dL
Nitrite: POSITIVE — AB
Protein, ur: NEGATIVE mg/dL
Specific Gravity, Urine: 1.025 (ref 1.005–1.030)
pH: 6 (ref 5.0–8.0)

## 2021-02-26 LAB — CBC WITH DIFFERENTIAL/PLATELET
Abs Immature Granulocytes: 0.02 10*3/uL (ref 0.00–0.07)
Basophils Absolute: 0.1 10*3/uL (ref 0.0–0.1)
Basophils Relative: 1 %
Eosinophils Absolute: 0.1 10*3/uL (ref 0.0–0.5)
Eosinophils Relative: 2 %
HCT: 36.8 % (ref 36.0–46.0)
Hemoglobin: 11.9 g/dL — ABNORMAL LOW (ref 12.0–15.0)
Immature Granulocytes: 0 %
Lymphocytes Relative: 34 %
Lymphs Abs: 1.9 10*3/uL (ref 0.7–4.0)
MCH: 25.8 pg — ABNORMAL LOW (ref 26.0–34.0)
MCHC: 32.3 g/dL (ref 30.0–36.0)
MCV: 79.8 fL — ABNORMAL LOW (ref 80.0–100.0)
Monocytes Absolute: 0.5 10*3/uL (ref 0.1–1.0)
Monocytes Relative: 9 %
Neutro Abs: 3 10*3/uL (ref 1.7–7.7)
Neutrophils Relative %: 54 %
Platelets: 234 10*3/uL (ref 150–400)
RBC: 4.61 MIL/uL (ref 3.87–5.11)
RDW: 16.2 % — ABNORMAL HIGH (ref 11.5–15.5)
WBC: 5.6 10*3/uL (ref 4.0–10.5)
nRBC: 0 % (ref 0.0–0.2)

## 2021-02-26 LAB — COMPREHENSIVE METABOLIC PANEL
ALT: 11 U/L (ref 0–44)
AST: 16 U/L (ref 15–41)
Albumin: 3.7 g/dL (ref 3.5–5.0)
Alkaline Phosphatase: 79 U/L (ref 38–126)
Anion gap: 10 (ref 5–15)
BUN: 22 mg/dL (ref 8–23)
CO2: 30 mmol/L (ref 22–32)
Calcium: 9.2 mg/dL (ref 8.9–10.3)
Chloride: 100 mmol/L (ref 98–111)
Creatinine, Ser: 0.98 mg/dL (ref 0.44–1.00)
GFR, Estimated: >60 mL/min (ref >60)
Glucose, Bld: 88 mg/dL (ref 70–99)
Potassium: 2.7 mmol/L — CL (ref 3.5–5.1)
Sodium: 140 mmol/L (ref 135–145)
Total Bilirubin: 0.6 mg/dL (ref 0.3–1.2)
Total Protein: 7.3 g/dL (ref 6.5–8.1)

## 2021-02-26 LAB — SURGICAL PCR SCREEN
MRSA, PCR: NEGATIVE
Staphylococcus aureus: NEGATIVE

## 2021-02-26 NOTE — Patient Instructions (Addendum)
Your procedure is scheduled on:03-06-21 TUESDAY Report to the Registration Desk on the 1st floor of the Medical Mall-Then proceed to the 2nd floor Surgery Desk in the [place] To find out your arrival time, please call [phone] between 1PM - 3PM on:03-05-21 MONDAY  REMEMBER: Instructions that are not followed completely may result in serious medical risk, up to and including death; or upon the discretion of your surgeon and anesthesiologist your surgery may need to be rescheduled.  Do not eat food after midnight the night before surgery.  No gum chewing, lozengers or hard candies.  You may however, drink CLEAR liquids up to 2 hours before you are scheduled to arrive for your surgery. Do not drink anything within 2 hours of your scheduled arrival time.  Clear liquids include: - water  - apple juice without pulp - gatorade - black coffee or tea (Do NOT add milk or creamers to the coffee or tea) Do NOT drink anything that is not on this list.  In addition, your doctor has ordered for you to drink the provided  Ensure Pre-Surgery Clear Carbohydrate Drink  Drinking this carbohydrate drink up to two hours before surgery helps to reduce insulin resistance and improve patient outcomes. Please complete drinking 2 hours prior to scheduled arrival time.  TAKE THESE MEDICATIONS THE MORNING OF SURGERY WITH A SIP OF WATER: -ACIPHEX   One week prior to surgery: Stop Anti-inflammatories (NSAIDS) such as Advil, Aleve, Ibuprofen, Motrin, Naproxen, Naprosyn and Aspirin based products such as Excedrin, Goodys Powder, BC Powder-OK TO TYLENOL OR TRAMADOL IF NEEDED  Stop ANY OVER THE COUNTER supplements/vitamins until after surgery.  No Alcohol for 24 hours before or after surgery.  No Smoking including e-cigarettes for 24 hours prior to surgery.  No chewable tobacco products for at least 6 hours prior to surgery.  No nicotine patches on the day of surgery.  Do not use any "recreational" drugs  for at least a week prior to your surgery.  Please be advised that the combination of cocaine and anesthesia may have negative outcomes, up to and including death. If you test positive for cocaine, your surgery will be cancelled.  On the morning of surgery brush your teeth with toothpaste and water, you may rinse your mouth with mouthwash if you wish. Do not swallow any toothpaste or mouthwash.  Do not wear jewelry, make-up, hairpins, clips or nail polish.  Do not wear lotions, powders, or perfumes.   Do not shave body from the neck down 48 hours prior to surgery just in case you cut yourself which could leave a site for infection.  Also, freshly shaved skin may become irritated if using the CHG soap.  Contact lenses, hearing aids and dentures may not be worn into surgery.  Do not bring valuables to the hospital. [facility] is not responsible for any missing/lost belongings or valuables.   Use CHG Soap as directed on instruction sheet.  Notify your doctor if there is any change in your medical condition (cold, fever, infection).  Wear comfortable clothing (specific to your surgery type) to the hospital.  Plan for stool softeners for home use; pain medications have a tendency to cause constipation. You can also help prevent constipation by eating foods high in fiber such as fruits and vegetables and drinking plenty of fluids as your diet allows.  After surgery, you can help prevent lung complications by doing breathing exercises.  Take deep breaths and cough every 1-2 hours. Your doctor may order a  device called an Incentive Spirometer to help you take deep breaths. When coughing or sneezing, hold a pillow firmly against your incision with both hands. This is called "splinting." Doing this helps protect your incision. It also decreases belly discomfort.  If you are being admitted to the hospital overnight, leave your suitcase in the car. After surgery it may be brought to your  room.  If you are being discharged the day of surgery, you will not be allowed to drive home. You will need a responsible adult (18 years or older) to drive you home and stay with you that night.   If you are taking public transportation, you will need to have a responsible adult (18 years or older) with you. Please confirm with your physician that it is acceptable to use public transportation.   Please call the [place] [facility]. at [phone] if you have any questions about these instructions.  Surgery Visitation Policy:  Patients undergoing a surgery or procedure may have one family member or support person with them as long as that person is not COVID-19 positive or experiencing its symptoms.  That person may remain in the waiting area during the procedure.  Inpatient Visitation:    Visiting hours are 7 a.m. to 8 p.m. Inpatients will be allowed two visitors daily. The visitors may change each day during the patient's stay. No visitors under the age of 53. Any visitor under the age of 41 must be accompanied by an adult. The visitor must pass COVID-19 screenings, use hand sanitizer when entering and exiting the patient's room and wear a mask at all times, including in the patient's room. Patients must also wear a mask when staff or their visitor are in the room. Masking is required regardless of vaccination status.

## 2021-02-26 NOTE — Progress Notes (Signed)
  [facility] Perioperative Services: Pre-Admission/Anesthesia Testing  Abnormal Lab Notification   Date: 02/26/21  Name: Jacqueline Delgado MRN:   [id number]  Re: Abnormal labs noted during PAT appointment   Provider(s) Notified: Hessie Knows, MD Notification mode: Routed and/or faxed via [place] LAB VALUE(S): Lab Results  Component Value Date   K 2.7 (LL) 02/26/2021   Lab Results  Component Value Date   COLORURINE YELLOW (A) 02/26/2021   APPEARANCEUR HAZY (A) 02/26/2021   LABSPEC 1.025 02/26/2021   PHURINE 6.0 02/26/2021   GLUCOSEU NEGATIVE 02/26/2021   HGBUR NEGATIVE 02/26/2021   BILIRUBINUR NEGATIVE 02/26/2021   KETONESUR NEGATIVE 02/26/2021   PROTEINUR NEGATIVE 02/26/2021   UROBILINOGEN 0.2 08/04/2019   NITRITE POSITIVE (A) 02/26/2021   LEUKOCYTESUR LARGE (A) 02/26/2021   EPIU 0-5 02/26/2021   WBCU 21-50 02/26/2021   RBCU 0-5 02/26/2021   BACTERIA MANY (A) 02/26/2021    Notes:  Patient is scheduled for a TOTAL KNEE ARTHROPLASTY on 03/06/2021.   1. HYPOKALEMIA  In review of patient's medication record, it is noted that she is taking both HCTZ and torsemide.    Cardiology did start back on KLOR-CON back in 01/11/2021 (40 mEq x 5 days, then 20 mEq daily), however she reported to PAT RN that she is not currently taking this medication.    Will send to surgeon for review an optimization. Order entered to have K+ recheck on day of procedure to ensure correction of noted derangement.   2. UTI  UA performed in PAT concerning for infection.   No leukocytosis noted on CBC; WBC 5.6  Renal function normal. Estimated Creatinine Clearance: 66.2 mL/min (by C-G formula based on SCr of 0.98 mg/dL).   Urine C&S added to assess for pathogenically significant growth.   Will forward UA, and subsequent C&S results, to attending surgeon for review and Tx as deemed appropriate.   This is a Community education officer; no formal response is required.  Honor Loh, MSN, APRN, FNP-C, CEN [facility]  Peri-operative Services Nurse Practitioner Phone: [phone] Fax: [phone] 02/26/21 12:07 PM

## 2021-02-27 LAB — TYPE AND SCREEN
ABO/RH(D): A POS
Antibody Screen: NEGATIVE

## 2021-02-28 LAB — URINE CULTURE: Culture: >=100000 — AB

## 2021-03-02 ENCOUNTER — Other Ambulatory Visit
Admission: RE | Admit: 2021-03-02 | Discharge: 2021-03-02 | Disposition: A | Discharge status: Home / Self Care | Payer: Medicare Other | Source: Ambulatory Visit | Attending: Orthopedic Surgery | Admitting: Orthopedic Surgery

## 2021-03-02 ENCOUNTER — Other Ambulatory Visit: Payer: Self-pay

## 2021-03-02 DIAGNOSIS — Z20822 Contact with and (suspected) exposure to covid-19: Secondary | ICD-10-CM | POA: Insufficient documentation

## 2021-03-02 DIAGNOSIS — Z01812 Encounter for preprocedural laboratory examination: Secondary | ICD-10-CM | POA: Insufficient documentation

## 2021-03-02 LAB — SARS CORONAVIRUS 2 (TAT 6-24 HRS): SARS Coronavirus 2: NEGATIVE

## 2021-03-06 ENCOUNTER — Other Ambulatory Visit: Payer: Self-pay

## 2021-03-06 ENCOUNTER — Inpatient Hospital Stay: Payer: Medicare Other | Admitting: Urgent Care

## 2021-03-06 ENCOUNTER — Encounter
Admission: RE | Disposition: A | Discharge status: Home Health | Payer: Self-pay | Source: Home / Self Care | Attending: Orthopedic Surgery

## 2021-03-06 ENCOUNTER — Observation Stay: Payer: Medicare Other

## 2021-03-06 ENCOUNTER — Inpatient Hospital Stay
Admission: RE | Admit: 2021-03-06 | Discharge: 2021-03-13 | DRG: 470 | Disposition: A | Discharge status: Home Health | Payer: Medicare Other | Attending: Orthopedic Surgery | Admitting: Orthopedic Surgery

## 2021-03-06 ENCOUNTER — Encounter: Payer: Self-pay | Admitting: Orthopedic Surgery

## 2021-03-06 DIAGNOSIS — Z96651 Presence of right artificial knee joint: Secondary | ICD-10-CM | POA: Diagnosis not present

## 2021-03-06 DIAGNOSIS — Z20822 Contact with and (suspected) exposure to covid-19: Secondary | ICD-10-CM | POA: Diagnosis present

## 2021-03-06 DIAGNOSIS — M170 Bilateral primary osteoarthritis of knee: Principal | ICD-10-CM | POA: Diagnosis present

## 2021-03-06 DIAGNOSIS — E876 Hypokalemia: Secondary | ICD-10-CM | POA: Diagnosis not present

## 2021-03-06 DIAGNOSIS — Z86010 Personal history of colonic polyps: Secondary | ICD-10-CM | POA: Diagnosis not present

## 2021-03-06 DIAGNOSIS — Z8249 Family history of ischemic heart disease and other diseases of the circulatory system: Secondary | ICD-10-CM | POA: Diagnosis not present

## 2021-03-06 DIAGNOSIS — G894 Chronic pain syndrome: Secondary | ICD-10-CM | POA: Diagnosis present

## 2021-03-06 DIAGNOSIS — E662 Morbid (severe) obesity with alveolar hypoventilation: Secondary | ICD-10-CM | POA: Diagnosis present

## 2021-03-06 DIAGNOSIS — Z23 Encounter for immunization: Secondary | ICD-10-CM

## 2021-03-06 DIAGNOSIS — Z8614 Personal history of Methicillin resistant Staphylococcus aureus infection: Secondary | ICD-10-CM | POA: Diagnosis not present

## 2021-03-06 DIAGNOSIS — Z8616 Personal history of COVID-19: Secondary | ICD-10-CM

## 2021-03-06 DIAGNOSIS — Z79899 Other long term (current) drug therapy: Secondary | ICD-10-CM | POA: Diagnosis not present

## 2021-03-06 DIAGNOSIS — Z96652 Presence of left artificial knee joint: Secondary | ICD-10-CM | POA: Diagnosis not present

## 2021-03-06 DIAGNOSIS — Z90721 Acquired absence of ovaries, unilateral: Secondary | ICD-10-CM

## 2021-03-06 DIAGNOSIS — Z6841 Body Mass Index (BMI) 40.0 and over, adult: Secondary | ICD-10-CM | POA: Diagnosis not present

## 2021-03-06 DIAGNOSIS — K219 Gastro-esophageal reflux disease without esophagitis: Secondary | ICD-10-CM | POA: Diagnosis present

## 2021-03-06 DIAGNOSIS — Z8371 Family history of colonic polyps: Secondary | ICD-10-CM

## 2021-03-06 DIAGNOSIS — I10 Essential (primary) hypertension: Secondary | ICD-10-CM | POA: Diagnosis present

## 2021-03-06 DIAGNOSIS — M25562 Pain in left knee: Secondary | ICD-10-CM | POA: Diagnosis present

## 2021-03-06 DIAGNOSIS — G8918 Other acute postprocedural pain: Secondary | ICD-10-CM

## 2021-03-06 DIAGNOSIS — R531 Weakness: Secondary | ICD-10-CM | POA: Diagnosis not present

## 2021-03-06 DIAGNOSIS — M1712 Unilateral primary osteoarthritis, left knee: Secondary | ICD-10-CM | POA: Diagnosis not present

## 2021-03-06 DIAGNOSIS — Z90710 Acquired absence of both cervix and uterus: Secondary | ICD-10-CM | POA: Diagnosis not present

## 2021-03-06 HISTORY — PX: TOTAL KNEE ARTHROPLASTY: SHX125

## 2021-03-06 LAB — ABO/RH: ABO/RH(D): A POS

## 2021-03-06 LAB — POCT I-STAT, CHEM 8
BUN: 18 mg/dL (ref 8–23)
Calcium, Ion: 1.24 mmol/L (ref 1.15–1.40)
Chloride: 104 mmol/L (ref 98–111)
Creatinine, Ser: 1 mg/dL (ref 0.44–1.00)
Glucose, Bld: 119 mg/dL — ABNORMAL HIGH (ref 70–99)
HCT: 32 % — ABNORMAL LOW (ref 36.0–46.0)
Hemoglobin: 10.9 g/dL — ABNORMAL LOW (ref 12.0–15.0)
Potassium: 3 mmol/L — ABNORMAL LOW (ref 3.5–5.1)
Sodium: 142 mmol/L (ref 135–145)
TCO2: 29 mmol/L (ref 22–32)

## 2021-03-06 LAB — CBC
HCT: 35.1 % — ABNORMAL LOW (ref 36.0–46.0)
Hemoglobin: 11 g/dL — ABNORMAL LOW (ref 12.0–15.0)
MCH: 25.5 pg — ABNORMAL LOW (ref 26.0–34.0)
MCHC: 31.3 g/dL (ref 30.0–36.0)
MCV: 81.4 fL (ref 80.0–100.0)
Platelets: 222 10*3/uL (ref 150–400)
RBC: 4.31 MIL/uL (ref 3.87–5.11)
RDW: 16.2 % — ABNORMAL HIGH (ref 11.5–15.5)
WBC: 7.2 10*3/uL (ref 4.0–10.5)
nRBC: 0 % (ref 0.0–0.2)

## 2021-03-06 LAB — CREATININE, SERUM
Creatinine, Ser: 0.96 mg/dL (ref 0.44–1.00)
GFR, Estimated: >60 mL/min (ref >60)

## 2021-03-06 SURGERY — ARTHROPLASTY, KNEE, TOTAL
Anesthesia: Spinal | Site: Knee | Laterality: Left

## 2021-03-06 MED ORDER — CEFAZOLIN SODIUM-DEXTROSE 2-4 GM/100ML-% IV SOLN
INTRAVENOUS | Status: AC
  Filled 2021-03-06: qty 100

## 2021-03-06 MED ORDER — METOCLOPRAMIDE HCL 5 MG/ML IJ SOLN
5.0000 mg | Freq: Three times a day (TID) | INTRAMUSCULAR | Status: DC | PRN
  Administered 2021-03-06: 5 mg via INTRAVENOUS
  Filled 2021-03-06: qty 2

## 2021-03-06 MED ORDER — MORPHINE SULFATE (PF) 10 MG/ML IV SOLN
INTRAVENOUS | Status: AC
  Filled 2021-03-06: qty 2

## 2021-03-06 MED ORDER — ACETAMINOPHEN 500 MG PO TABS
1000.0000 mg | ORAL_TABLET | Freq: Four times a day (QID) | ORAL | Status: AC
  Administered 2021-03-06 – 2021-03-07 (×4): 1000 mg via ORAL
  Filled 2021-03-06 (×4): qty 2

## 2021-03-06 MED ORDER — SODIUM CHLORIDE 0.9 % IV SOLN
INTRAVENOUS | Status: DC | PRN
  Administered 2021-03-06: 60 mL

## 2021-03-06 MED ORDER — PROPOFOL 10 MG/ML IV BOLUS
INTRAVENOUS | Status: AC
  Filled 2021-03-06: qty 20

## 2021-03-06 MED ORDER — MAGNESIUM CITRATE PO SOLN
1.0000 | Freq: Once | ORAL | Status: AC | PRN
  Administered 2021-03-12: 1 via ORAL
  Filled 2021-03-06 (×2): qty 296

## 2021-03-06 MED ORDER — METOCLOPRAMIDE HCL 10 MG PO TABS
5.0000 mg | ORAL_TABLET | Freq: Three times a day (TID) | ORAL | Status: DC | PRN
  Administered 2021-03-07 – 2021-03-08 (×2): 10 mg via ORAL
  Filled 2021-03-06 (×2): qty 1

## 2021-03-06 MED ORDER — CHLORHEXIDINE GLUCONATE 0.12 % MT SOLN: 15.0000 mL | Freq: Once | OROMUCOSAL | Status: AC

## 2021-03-06 MED ORDER — POLYETHYLENE GLYCOL 3350 17 G PO PACK
17.0000 g | PACK | Freq: Every day | ORAL | Status: DC | PRN
  Administered 2021-03-07 – 2021-03-11 (×2): 17 g via ORAL
  Filled 2021-03-06 (×2): qty 1

## 2021-03-06 MED ORDER — ONDANSETRON HCL 4 MG/2ML IJ SOLN
4.0000 mg | Freq: Once | INTRAMUSCULAR | Status: DC | PRN
Start: 2021-03-06 — End: 2021-03-06

## 2021-03-06 MED ORDER — CEFAZOLIN SODIUM-DEXTROSE 2-4 GM/100ML-% IV SOLN
2.0000 g | Freq: Four times a day (QID) | INTRAVENOUS | Status: AC
  Administered 2021-03-06 (×2): 2 g via INTRAVENOUS
  Filled 2021-03-06 (×2): qty 100

## 2021-03-06 MED ORDER — BUPIVACAINE-EPINEPHRINE (PF) 0.25% -1:200000 IJ SOLN
INTRAMUSCULAR | Status: DC | PRN
  Administered 2021-03-06: 30 mL

## 2021-03-06 MED ORDER — MENTHOL 3 MG MT LOZG
1.0000 | LOZENGE | OROMUCOSAL | Status: DC | PRN
  Administered 2021-03-10: 3 mg via ORAL
  Filled 2021-03-06 (×2): qty 9

## 2021-03-06 MED ORDER — LOSARTAN POTASSIUM 50 MG PO TABS
50.0000 mg | ORAL_TABLET | Freq: Every day | ORAL | Status: DC
  Administered 2021-03-06 – 2021-03-12 (×7): 50 mg via ORAL
  Filled 2021-03-06 (×8): qty 1

## 2021-03-06 MED ORDER — HYDROCHLOROTHIAZIDE 25 MG PO TABS
25.0000 mg | ORAL_TABLET | Freq: Every day | ORAL | Status: DC
  Administered 2021-03-06 – 2021-03-10 (×4): 25 mg via ORAL
  Filled 2021-03-06 (×6): qty 1

## 2021-03-06 MED ORDER — ONDANSETRON HCL 4 MG PO TABS
4.0000 mg | ORAL_TABLET | Freq: Four times a day (QID) | ORAL | Status: DC | PRN
  Administered 2021-03-12: 4 mg via ORAL
  Filled 2021-03-06 (×2): qty 1

## 2021-03-06 MED ORDER — OXYCODONE HCL 5 MG PO TABS
5.0000 mg | ORAL_TABLET | ORAL | Status: DC | PRN
  Administered 2021-03-06 (×2): 5 mg via ORAL
  Administered 2021-03-09 – 2021-03-10 (×4): 10 mg via ORAL
  Administered 2021-03-11 (×3): 5 mg via ORAL
  Administered 2021-03-11: 10 mg via ORAL
  Filled 2021-03-06: qty 2
  Filled 2021-03-06 (×2): qty 1
  Filled 2021-03-06 (×5): qty 2
  Filled 2021-03-06: qty 1
  Filled 2021-03-06 (×4): qty 2

## 2021-03-06 MED ORDER — EPHEDRINE 5 MG/ML INJ
INTRAVENOUS | Status: AC
  Filled 2021-03-06: qty 10

## 2021-03-06 MED ORDER — ONDANSETRON HCL 4 MG/2ML IJ SOLN
4.0000 mg | Freq: Four times a day (QID) | INTRAMUSCULAR | Status: DC | PRN
  Administered 2021-03-06 – 2021-03-07 (×2): 4 mg via INTRAVENOUS
  Filled 2021-03-06 (×2): qty 2

## 2021-03-06 MED ORDER — HYDROMORPHONE HCL 1 MG/ML IJ SOLN
0.5000 mg | INTRAMUSCULAR | Status: DC | PRN
Start: 2021-03-06 — End: 2021-03-13
  Administered 2021-03-06: 1 mg via INTRAVENOUS
  Administered 2021-03-06 (×2): 0.5 mg via INTRAVENOUS
  Administered 2021-03-07: 1 mg via INTRAVENOUS
  Filled 2021-03-06 (×4): qty 1

## 2021-03-06 MED ORDER — LIDOCAINE HCL (CARDIAC) PF 100 MG/5ML IV SOSY
PREFILLED_SYRINGE | INTRAVENOUS | Status: DC | PRN
  Administered 2021-03-06: 40 mg via INTRAVENOUS

## 2021-03-06 MED ORDER — FENTANYL CITRATE (PF) 100 MCG/2ML IJ SOLN: 25.0000 ug | INTRAMUSCULAR | Status: DC | PRN

## 2021-03-06 MED ORDER — PROPOFOL 500 MG/50ML IV EMUL
INTRAVENOUS | Status: DC | PRN
  Administered 2021-03-06: 120 ug/kg/min via INTRAVENOUS

## 2021-03-06 MED ORDER — DEXAMETHASONE SODIUM PHOSPHATE 10 MG/ML IJ SOLN
INTRAMUSCULAR | Status: DC | PRN
  Administered 2021-03-06: 5 mg via INTRAVENOUS

## 2021-03-06 MED ORDER — DIPHENHYDRAMINE HCL 12.5 MG/5ML PO ELIX
12.5000 mg | ORAL_SOLUTION | ORAL | Status: DC | PRN
  Administered 2021-03-07: 12.5 mg via ORAL
  Filled 2021-03-06: qty 10

## 2021-03-06 MED ORDER — PROPOFOL 500 MG/50ML IV EMUL
INTRAVENOUS | Status: AC
  Filled 2021-03-06: qty 100

## 2021-03-06 MED ORDER — PHENYLEPHRINE HCL (PRESSORS) 10 MG/ML IV SOLN
INTRAVENOUS | Status: DC | PRN
  Administered 2021-03-06 (×2): 200 ug via INTRAVENOUS

## 2021-03-06 MED ORDER — SODIUM CHLORIDE 0.9 % IV SOLN: INTRAVENOUS | Status: DC

## 2021-03-06 MED ORDER — BUPIVACAINE-EPINEPHRINE (PF) 0.25% -1:200000 IJ SOLN
INTRAMUSCULAR | Status: AC
  Filled 2021-03-06: qty 60

## 2021-03-06 MED ORDER — ENOXAPARIN SODIUM 30 MG/0.3ML IJ SOSY
30.0000 mg | PREFILLED_SYRINGE | Freq: Two times a day (BID) | INTRAMUSCULAR | Status: DC
  Administered 2021-03-07 – 2021-03-13 (×13): 30 mg via SUBCUTANEOUS
  Filled 2021-03-06 (×13): qty 0.3

## 2021-03-06 MED ORDER — SUCCINYLCHOLINE CHLORIDE 200 MG/10ML IV SOSY
PREFILLED_SYRINGE | INTRAVENOUS | Status: AC
  Filled 2021-03-06: qty 10

## 2021-03-06 MED ORDER — TRAMADOL HCL 50 MG PO TABS
50.0000 mg | ORAL_TABLET | Freq: Four times a day (QID) | ORAL | Status: DC
  Administered 2021-03-06 – 2021-03-13 (×26): 50 mg via ORAL
  Filled 2021-03-06 (×28): qty 1

## 2021-03-06 MED ORDER — BISACODYL 10 MG RE SUPP
10.0000 mg | Freq: Every day | RECTAL | Status: DC | PRN
  Administered 2021-03-11: 10 mg via RECTAL
  Filled 2021-03-06: qty 1

## 2021-03-06 MED ORDER — ACETAMINOPHEN 325 MG PO TABS
325.0000 mg | ORAL_TABLET | Freq: Four times a day (QID) | ORAL | Status: DC | PRN
Start: 2021-03-07 — End: 2021-03-13

## 2021-03-06 MED ORDER — FAMOTIDINE 20 MG PO TABS
40.0000 mg | ORAL_TABLET | Freq: Every day | ORAL | Status: DC
  Administered 2021-03-06 – 2021-03-12 (×7): 40 mg via ORAL
  Filled 2021-03-06 (×7): qty 2

## 2021-03-06 MED ORDER — DEXMEDETOMIDINE HCL IN NACL 200 MCG/50ML IV SOLN
INTRAVENOUS | Status: DC | PRN
  Administered 2021-03-06: 8 ug via INTRAVENOUS

## 2021-03-06 MED ORDER — NEOMYCIN-POLYMYXIN B GU 40-200000 IR SOLN
Status: AC
  Filled 2021-03-06: qty 40

## 2021-03-06 MED ORDER — PANTOPRAZOLE SODIUM 40 MG PO TBEC
40.0000 mg | DELAYED_RELEASE_TABLET | Freq: Every day | ORAL | Status: DC
  Administered 2021-03-07 – 2021-03-13 (×7): 40 mg via ORAL
  Filled 2021-03-06 (×7): qty 1

## 2021-03-06 MED ORDER — LIDOCAINE HCL (PF) 2 % IJ SOLN
INTRAMUSCULAR | Status: AC
  Filled 2021-03-06: qty 2

## 2021-03-06 MED ORDER — METHOCARBAMOL 500 MG PO TABS
500.0000 mg | ORAL_TABLET | Freq: Four times a day (QID) | ORAL | Status: DC | PRN
  Administered 2021-03-06 – 2021-03-10 (×7): 500 mg via ORAL
  Filled 2021-03-06 (×8): qty 1

## 2021-03-06 MED ORDER — ZOLPIDEM TARTRATE 5 MG PO TABS
5.0000 mg | ORAL_TABLET | Freq: Every evening | ORAL | Status: DC | PRN
  Administered 2021-03-06 – 2021-03-11 (×3): 5 mg via ORAL
  Filled 2021-03-06 (×3): qty 1

## 2021-03-06 MED ORDER — SODIUM CHLORIDE 0.9 % IV SOLN
INTRAVENOUS | Status: DC | PRN
  Administered 2021-03-06: 25 ug/min via INTRAVENOUS

## 2021-03-06 MED ORDER — MIDAZOLAM HCL 2 MG/2ML IJ SOLN
INTRAMUSCULAR | Status: AC
  Filled 2021-03-06: qty 2

## 2021-03-06 MED ORDER — LACTATED RINGERS IV SOLN: INTRAVENOUS | Status: DC

## 2021-03-06 MED ORDER — FLUTICASONE PROPIONATE 50 MCG/ACT NA SUSP
2.0000 | Freq: Every day | NASAL | Status: DC | PRN
  Filled 2021-03-06: qty 16

## 2021-03-06 MED ORDER — ROCURONIUM BROMIDE 10 MG/ML (PF) SYRINGE
PREFILLED_SYRINGE | INTRAVENOUS | Status: AC
  Filled 2021-03-06: qty 10

## 2021-03-06 MED ORDER — NEOMYCIN-POLYMYXIN B GU 40-200000 IR SOLN
Status: DC | PRN
  Administered 2021-03-06: 14 mL

## 2021-03-06 MED ORDER — PHENOL 1.4 % MT LIQD
1.0000 | OROMUCOSAL | Status: DC | PRN
  Filled 2021-03-06: qty 177

## 2021-03-06 MED ORDER — ONDANSETRON HCL 4 MG/2ML IJ SOLN
INTRAMUSCULAR | Status: DC | PRN
  Administered 2021-03-06: 4 mg via INTRAVENOUS

## 2021-03-06 MED ORDER — MORPHINE SULFATE (PF) 10 MG/ML IV SOLN
INTRAVENOUS | Status: DC | PRN
  Administered 2021-03-06: 10 mg

## 2021-03-06 MED ORDER — DOCUSATE SODIUM 100 MG PO CAPS
100.0000 mg | ORAL_CAPSULE | Freq: Two times a day (BID) | ORAL | Status: DC
  Administered 2021-03-06 – 2021-03-12 (×13): 100 mg via ORAL
  Filled 2021-03-06 (×15): qty 1

## 2021-03-06 MED ORDER — MIDAZOLAM HCL 5 MG/5ML IJ SOLN
INTRAMUSCULAR | Status: DC | PRN
  Administered 2021-03-06: 2 mg via INTRAVENOUS

## 2021-03-06 MED ORDER — BUPIVACAINE HCL (PF) 0.5 % IJ SOLN
INTRAMUSCULAR | Status: AC
  Filled 2021-03-06: qty 10

## 2021-03-06 MED ORDER — METHOCARBAMOL 1000 MG/10ML IJ SOLN
500.0000 mg | Freq: Four times a day (QID) | INTRAVENOUS | Status: DC | PRN
  Filled 2021-03-06: qty 5

## 2021-03-06 MED ORDER — ALUM & MAG HYDROXIDE-SIMETH 200-200-20 MG/5ML PO SUSP
30.0000 mL | ORAL | Status: DC | PRN
  Administered 2021-03-07 – 2021-03-08 (×2): 30 mL via ORAL
  Filled 2021-03-06 (×2): qty 30

## 2021-03-06 MED ORDER — OXYCODONE HCL 5 MG PO TABS
10.0000 mg | ORAL_TABLET | ORAL | Status: DC | PRN
  Administered 2021-03-06 – 2021-03-07 (×3): 10 mg via ORAL
  Administered 2021-03-08: 15 mg via ORAL
  Administered 2021-03-08 – 2021-03-13 (×7): 10 mg via ORAL
  Filled 2021-03-06 (×4): qty 2
  Filled 2021-03-06: qty 3
  Filled 2021-03-06 (×3): qty 2

## 2021-03-06 MED ORDER — SURGIPHOR WOUND IRRIGATION SYSTEM - OPTIME
TOPICAL | Status: DC | PRN
  Administered 2021-03-06: 400 mL

## 2021-03-06 MED ORDER — BUPIVACAINE HCL (PF) 0.5 % IJ SOLN
INTRAMUSCULAR | Status: DC | PRN
  Administered 2021-03-06: 3 mL

## 2021-03-06 MED ORDER — ORAL CARE MOUTH RINSE: 15.0000 mL | Freq: Once | OROMUCOSAL | Status: AC

## 2021-03-06 MED ORDER — OXYBUTYNIN CHLORIDE ER 5 MG PO TB24
10.0000 mg | ORAL_TABLET | Freq: Every day | ORAL | Status: DC
  Administered 2021-03-07 – 2021-03-12 (×5): 10 mg via ORAL
  Filled 2021-03-06 (×6): qty 2

## 2021-03-06 MED ORDER — BUPIVACAINE LIPOSOME 1.3 % IJ SUSP
INTRAMUSCULAR | Status: AC
  Filled 2021-03-06: qty 40

## 2021-03-06 MED ORDER — CHLORHEXIDINE GLUCONATE 0.12 % MT SOLN
OROMUCOSAL | Status: AC
  Administered 2021-03-06: 15 mL via OROMUCOSAL
  Filled 2021-03-06: qty 15

## 2021-03-06 MED ORDER — SODIUM CHLORIDE FLUSH 0.9 % IV SOLN
INTRAVENOUS | Status: AC
  Filled 2021-03-06: qty 80

## 2021-03-06 MED ORDER — CEFAZOLIN SODIUM-DEXTROSE 2-4 GM/100ML-% IV SOLN
2.0000 g | INTRAVENOUS | Status: AC
  Administered 2021-03-06: 2 g via INTRAVENOUS

## 2021-03-06 SURGICAL SUPPLY — 77 items
BLADE SAGITTAL 25.0X1.19X90 (BLADE) ×2 IMPLANT
BLADE SAW 90X13X1.19 OSCILLAT (BLADE) ×2 IMPLANT
BLOCK CUTTING FEMUR 3 LT MED (MISCELLANEOUS) ×2 IMPLANT
BLOCK CUTTING TIBIAL 3 LT (MISCELLANEOUS) ×2 IMPLANT
BNDG ELASTIC 6X5.8 VLCR STR LF (GAUZE/BANDAGES/DRESSINGS) ×2 IMPLANT
CANISTER SUCT 1200ML W/VALVE (MISCELLANEOUS) IMPLANT
CANISTER WOUND CARE 500ML ATS (WOUND CARE) ×2 IMPLANT
CEMENT HV SMART SET (Cement) ×4 IMPLANT
CEMENT PATELLA RESURF SZ1 (Cement) ×2 IMPLANT
CHLORAPREP W/TINT 26 (MISCELLANEOUS) ×4 IMPLANT
COOLER POLAR GLACIER W/PUMP (MISCELLANEOUS) ×2 IMPLANT
COVER WAND RF STERILE (DRAPES) ×2 IMPLANT
CUFF TOURN SGL QUICK 24 (TOURNIQUET CUFF)
CUFF TOURN SGL QUICK 34 (TOURNIQUET CUFF)
CUFF TOURN SGL QUICK 42 (TOURNIQUET CUFF) ×2 IMPLANT
CUFF TRNQT CYL 24X4X16.5-23 (TOURNIQUET CUFF) IMPLANT
CUFF TRNQT CYL 34X4.125X (TOURNIQUET CUFF) IMPLANT
DRAPE 3/4 80X56 (DRAPES) ×4 IMPLANT
DRSG MEPILEX SACRM 8.7X9.8 (GAUZE/BANDAGES/DRESSINGS) ×2 IMPLANT
ELECT CAUTERY BLADE 6.4 (BLADE) ×2 IMPLANT
ELECT REM PT RETURN 9FT ADLT (ELECTROSURGICAL) ×2
ELECTRODE REM PT RTRN 9FT ADLT (ELECTROSURGICAL) ×1 IMPLANT
FEMORAL COMP CEMENTED SZ3 L (Femur) ×2 IMPLANT
FEMUR BONE MODEL 4.9010 MEDACT (MISCELLANEOUS) ×2 IMPLANT
GAUZE 4X4 16PLY RFD (DISPOSABLE) ×2 IMPLANT
GAUZE SPONGE 4X4 12PLY STRL (GAUZE/BANDAGES/DRESSINGS) ×2 IMPLANT
GAUZE XEROFORM 1X8 LF (GAUZE/BANDAGES/DRESSINGS) ×2 IMPLANT
GLOVE SURG ORTHO LTX SZ8 (GLOVE) ×2 IMPLANT
GLOVE SURG SYN 9.0  PF PI (GLOVE) ×1
GLOVE SURG SYN 9.0 PF PI (GLOVE) ×1 IMPLANT
GLOVE SURG UNDER LTX SZ8 (GLOVE) ×2 IMPLANT
GLOVE SURG UNDER POLY LF SZ9 (GLOVE) ×2 IMPLANT
GOWN SRG 2XL LVL 4 RGLN SLV (GOWNS) ×1 IMPLANT
GOWN STRL NON-REIN 2XL LVL4 (GOWNS) ×1
GOWN STRL REUS W/ TWL LRG LVL3 (GOWN DISPOSABLE) ×1 IMPLANT
GOWN STRL REUS W/ TWL XL LVL3 (GOWN DISPOSABLE) ×1 IMPLANT
GOWN STRL REUS W/TWL LRG LVL3 (GOWN DISPOSABLE) ×1
GOWN STRL REUS W/TWL XL LVL3 (GOWN DISPOSABLE) ×1
HANDLE YANKAUER SUCT BULB TIP (MISCELLANEOUS) ×2 IMPLANT
HOLDER FOLEY CATH W/STRAP (MISCELLANEOUS) ×2 IMPLANT
HOOD PEEL AWAY FLYTE STAYCOOL (MISCELLANEOUS) ×4 IMPLANT
INSERT TIBIAL SZ3 LT 14 FLEX (Insert) ×2 IMPLANT
IRRIGATION SURGIPHOR STRL (IV SOLUTION) ×2 IMPLANT
IV NS IRRIG 3000ML ARTHROMATIC (IV SOLUTION) ×2 IMPLANT
KIT PREVENA INCISION MGT20CM45 (CANNISTER) ×2 IMPLANT
KIT TURNOVER KIT A (KITS) ×2 IMPLANT
MANIFOLD NEPTUNE II (INSTRUMENTS) ×4 IMPLANT
NDL SAFETY ECLIPSE 18X1.5 (NEEDLE) ×1 IMPLANT
NEEDLE HYPO 18GX1.5 SHARP (NEEDLE) ×1
NEEDLE SPNL 18GX3.5 QUINCKE PK (NEEDLE) ×2 IMPLANT
NEEDLE SPNL 20GX3.5 QUINCKE YW (NEEDLE) ×2 IMPLANT
NS IRRIG 1000ML POUR BTL (IV SOLUTION) ×2 IMPLANT
PACK TOTAL KNEE (MISCELLANEOUS) ×2 IMPLANT
PAD WRAPON POLAR KNEE (MISCELLANEOUS) IMPLANT
PAD WRAPON POLOR MULTI XL (MISCELLANEOUS) ×1 IMPLANT
PENCIL SMOKE EVACUATOR COATED (MISCELLANEOUS) ×2 IMPLANT
PULSAVAC PLUS IRRIG FAN TIP (DISPOSABLE) ×2
SCALPEL PROTECTED #10 DISP (BLADE) ×4 IMPLANT
STAPLER SKIN PROX 35W (STAPLE) ×2 IMPLANT
STEM EXTENSION 11MMX30MM (Stem) ×2 IMPLANT
SUCTION FRAZIER HANDLE 10FR (MISCELLANEOUS) ×1
SUCTION TUBE FRAZIER 10FR DISP (MISCELLANEOUS) ×1 IMPLANT
SUT DVC 2 QUILL PDO  T11 36X36 (SUTURE) ×1
SUT DVC 2 QUILL PDO T11 36X36 (SUTURE) ×1 IMPLANT
SUT ETHIBOND 2 V 37 (SUTURE) ×2 IMPLANT
SUT V-LOC 90 ABS DVC 3-0 CL (SUTURE) ×2 IMPLANT
SYR 20ML LL LF (SYRINGE) ×2 IMPLANT
SYR 50ML LL SCALE MARK (SYRINGE) ×4 IMPLANT
TIBIAL BONE MODEL LEFT (MISCELLANEOUS) ×2 IMPLANT
TIBIAL TRAY FIXED MEDACTA 0207 (Bone Implant) ×2 IMPLANT
TIP FAN IRRIG PULSAVAC PLUS (DISPOSABLE) ×1 IMPLANT
TOWEL OR 17X26 4PK STRL BLUE (TOWEL DISPOSABLE) ×2 IMPLANT
TOWER CARTRIDGE SMART MIX (DISPOSABLE) ×2 IMPLANT
TRAY FOLEY MTR SLVR 16FR STAT (SET/KITS/TRAYS/PACK) ×2 IMPLANT
WRAP-ON POLOR PAD MULTI XL (MISCELLANEOUS) ×1
WRAPON POLAR PAD KNEE (MISCELLANEOUS)
WRAPON POLOR PAD MULTI XL (MISCELLANEOUS) ×1

## 2021-03-06 NOTE — Plan of Care (Signed)

## 2021-03-06 NOTE — Op Note (Signed)
03/06/2021  9:35 AM  PATIENT:  Jacqueline Delgado   MRN: [id number]  PRE-OPERATIVE DIAGNOSIS:  Primary localized osteoarthritis of left knee   POST-OPERATIVE DIAGNOSIS:  Same   PROCEDURE:  Procedure(s): Left TOTAL KNEE ARTHROPLASTY   SURGEON: Laurene Footman, MD   ASSISTANTS: Rachelle Hora, PA-C   ANESTHESIA:   spinal   EBL:   100   BLOOD ADMINISTERED:none   DRAINS: Incisional wound VAC    LOCAL MEDICATIONS USED:  MARCAINE    and OTHER Exparel and morphine   SPECIMEN:  No Specimen   DISPOSITION OF SPECIMEN:  N/A   COUNTS:  YES   TOURNIQUET:   65 at 300 mm Hg   IMPLANTS: Medacta  GMK sphere system with  3 left femur, 3 left tibia with short stem and  14 mm insert.  Size  1 patella, all components cemented.   DICTATION: Viviann Spare Dictation   patient was brought to the operating room and spinal anesthesia was obtained.  After prepping and draping the  left leg in sterile fashion, and after patient identification and timeout procedures were completed, tourniquet was raised  and midline skin incision was made followed by medial parapatellar arthrotomy with  severe medial compartment osteoarthritis, severe patellofemoral arthritis and  moderate lateral compartment arthritis, partial synovectomy was also carried out.   The ACL and PCL and fat pad were excised along with anterior horns of the meniscus. The proximal tibia cutting guide from  the [facility] system was applied and the proximal tibia cut carried out.  The distal femoral cut was carried out in a similar fashion     The  3 femoral cutting guide applied with anterior posterior and chamfer cuts made.  The posterior horns of the menisci were removed at this point.   Injection of the above medication was carried out after the femoral and tibial cuts were carried out.  The  3 baseplate trial was placed pinned into position and proximal tibial preparation carried out with drilling hand reaming and the keel punch followed by placement of the  3  left femur and sizing the tibial insert size   14 millimeter gave the best fit with stability and full extension.  The distal femoral drill holes were made in the notch cut for the trochlear groove was then carried out with trials were then removed the patella was cut using the patellar cutting guide and it sized to a size  1 after drill holes have been made  The knee was irrigated with pulsatile lavage and the bony surfaces dried the tibial component was cemented into place first.  Excess cement was removed and the polyethylene insert placed with a torque screw placed with a torque screwdriver tightened.  The distal femoral component was placed and the knee was held in extension as the patellar button was clamped into place.  After the cement was set, excess cement was removed and the knee was again irrigated thoroughly thoroughly irrigated.  The tourniquet was let down and hemostasis checked with electrocautery. The arthrotomy was repaired with a heavy Quill suture,  followed by 3-0 V lock subcuticular closure, skin staples followed by incisional wound VAC and Polar Care.Marland Kitchen   PLAN OF CARE: Admit for overnight observation   PATIENT DISPOSITION:  PACU - hemodynamically stable.

## 2021-03-06 NOTE — Transfer of Care (Signed)
Immediate Anesthesia Transfer of Care Note  Patient: Jacqueline Delgado  Procedure(s) Performed: TOTAL KNEE ARTHROPLASTY - Rachelle Hora to Assist (Left Knee)  Patient Location: PACU  Anesthesia Type:Spinal  Level of Consciousness: awake, alert  and oriented  Airway & Oxygen Therapy: Patient Spontanous Breathing and Patient connected to face mask oxygen  Post-op Assessment: Report given to RN and Post -op Vital signs reviewed and stable  Post vital signs: Reviewed and stable  Last Vitals:  Vitals Value Taken Time  BP 100/79 03/06/21 0925  Temp 36.6 C 03/06/21 0925  Pulse 83 03/06/21 0928  Resp 20 03/06/21 0928  SpO2 93 % 03/06/21 0928  Vitals shown include unvalidated device data.  Last Pain:  Vitals:   03/06/21 0642  TempSrc: Temporal  PainSc: 0-No pain         Complications: No complications documented.

## 2021-03-06 NOTE — Evaluation (Signed)
Physical Therapy Evaluation Patient Details Name: Jacqueline Delgado MRN: [id number] DOB: Feb 17, 1951 Today's Date: 03/06/2021   History of Present Illness  Pt is a 70 yo female s/p L TKA, WBAT. PMH of R TKA, chronic pain syndrome, hiatal hernia, leukocytoclastic vasculitis, R shoulder surgery.    Clinical Impression  Patient alert, agreeable to PT reported 8/10 pain in L knee. She reported at baseline she is independent.  The patient was able to perform several supine exercises, AAROM for LLE due to increased pain. Supine <> sit modA for LLE assist, effortful and painful for pt. She was able to sit EOB with fair balance for several minutes, but further mobility deferred due to significant pain (10/10). Returned to supine, pt complained of nausea, emesis bag and RN contacted and in room to provide medication quickly.  Overall the patient demonstrated deficits (see "PT Problem List") that impede the patient's functional abilities, safety, and mobility and would benefit from skilled PT intervention. Recommendation is SNF due to current level of assistance needed and decreased caregiver support pending further pt progress.     Follow Up Recommendations SNF    Equipment Recommendations  3in1 (PT)    Recommendations for Other Services       Precautions / Restrictions Precautions Precautions: Fall Restrictions Weight Bearing Restrictions: Yes LLE Weight Bearing: Weight bearing as tolerated      Mobility  Bed Mobility Overal bed mobility: Needs Assistance Bed Mobility: Supine to Sit     Supine to sit: Mod assist;HOB elevated     General bed mobility comments: for LLE assist    Transfers                 General transfer comment: deferred due to pt pain  Ambulation/Gait                Stairs            Wheelchair Mobility    Modified Rankin (Stroke Patients Only)       Balance Overall balance assessment: Needs assistance Sitting-balance support: Feet  supported Sitting balance-Leahy Scale: Poor                                       Pertinent Vitals/Pain Pain Assessment: 0-10 Pain Score: 8  Pain Location: 10/10 L knee pain Pain Descriptors / Indicators: Aching;Guarding;Grimacing;Sore Pain Intervention(s): Limited activity within patient's tolerance;Monitored during session;Premedicated before session;Repositioned;Ice applied    Home Living Family/patient expects to be discharged to:: Private residence Living Arrangements: Alone Available Help at Discharge: Family;Available PRN/intermittently Type of Home: House Home Access: Stairs to enter Entrance Stairs-Rails: None Entrance Stairs-Number of Steps: 2+1 Home Layout: One level Home Equipment: Environmental consultant - 2 wheels      Prior Function Level of Independence: Independent               Hand Dominance        Extremity/Trunk Assessment   Upper Extremity Assessment Upper Extremity Assessment: Overall WFL for tasks assessed    Lower Extremity Assessment Lower Extremity Assessment: RLE deficits/detail (s/p L TKA) RLE Deficits / Details: WFLs       Communication   Communication: No difficulties  Cognition Arousal/Alertness: Awake/alert Behavior During Therapy: WFL for tasks assessed/performed Overall Cognitive Status: Within Functional Limits for tasks assessed  General Comments      Exercises Total Joint Exercises Ankle Circles/Pumps: AROM;Both;10 reps Quad Sets: AROM;10 reps;Both Heel Slides: AAROM;Left;10 reps Hip ABduction/ADduction: AAROM;Left;10 reps Straight Leg Raises: AROM;Left;5 reps   Assessment/Plan    PT Assessment Patient needs continued PT services  PT Problem List Decreased strength;Decreased mobility;Decreased activity tolerance;Decreased balance;Pain;Decreased range of motion;Decreased knowledge of use of DME;Decreased knowledge of precautions       PT Treatment  Interventions DME instruction;Therapeutic exercise;Gait training;Balance training;Stair training;Neuromuscular re-education;Functional mobility training;Therapeutic activities;Patient/family education    PT Goals (Current goals can be found in the Care Plan section)  Acute Rehab PT Goals Patient Stated Goal: to go home PT Goal Formulation: With patient Time For Goal Achievement: 03/20/21 Potential to Achieve Goals: Good    Frequency BID   Barriers to discharge Decreased caregiver support      Co-evaluation               AM-PAC PT "6 Clicks" Mobility  Outcome Measure Help needed turning from your back to your side while in a flat bed without using bedrails?: A Lot Help needed moving from lying on your back to sitting on the side of a flat bed without using bedrails?: A Lot Help needed moving to and from a bed to a chair (including a wheelchair)?: A Lot Help needed standing up from a chair using your arms (e.g., wheelchair or bedside chair)?: A Lot Help needed to walk in hospital room?: A Lot Help needed climbing 3-5 steps with a railing? : Total 6 Click Score: 11    End of Session Equipment Utilized During Treatment: Gait belt Activity Tolerance: Patient limited by pain Patient left: in bed;with call bell/phone within reach;with bed alarm set Nurse Communication: Mobility status PT Visit Diagnosis: Other abnormalities of gait and mobility (R26.89);Muscle weakness (generalized) (M62.81);Difficulty in walking, not elsewhere classified (R26.2);Pain Pain - Right/Left: Left    Time: 2993-7169 PT Time Calculation (min) (ACUTE ONLY): 26 min   Charges:   PT Evaluation $PT Eval Low Complexity: 1 Low PT Treatments $Therapeutic Exercise: 23-37 mins       Lieutenant Diego PT, DPT 4:20 PM,03/06/21

## 2021-03-06 NOTE — Anesthesia Preprocedure Evaluation (Signed)
Anesthesia Evaluation  Patient identified by MRN, date of birth, ID band Patient awake    Reviewed: Allergy & Precautions, NPO status , Patient's Chart, lab work & pertinent test results  History of Anesthesia Complications Negative for: history of anesthetic complications  Airway Mallampati: II       Dental   Pulmonary sleep apnea (Pt has not been tested) , neg COPD, Not current smoker,           Cardiovascular hypertension, Pt. on medications (-) Past MI and (-) CHF (-) dysrhythmias (-) Valvular Problems/Murmurs     Neuro/Psych neg Seizures Depression    GI/Hepatic Neg liver ROS, hiatal hernia, GERD  Medicated and Controlled,  Endo/Other  neg diabetes  Renal/GU negative Renal ROS     Musculoskeletal   Abdominal   Peds  Hematology  (+) anemia ,   Anesthesia Other Findings   Reproductive/Obstetrics                             Anesthesia Physical Anesthesia Plan  ASA: II  Anesthesia Plan: Spinal   Post-op Pain Management:    Induction: Intravenous  PONV Risk Score and Plan:   Airway Management Planned:   Additional Equipment:   Intra-op Plan:   Post-operative Plan:   Informed Consent: I have reviewed the patients History and Physical, chart, labs and discussed the procedure including the risks, benefits and alternatives for the proposed anesthesia with the patient or authorized representative who has indicated his/her understanding and acceptance.       Plan Discussed with:   Anesthesia Plan Comments:         Anesthesia Quick Evaluation

## 2021-03-06 NOTE — Anesthesia Procedure Notes (Addendum)
Spinal  Patient location during procedure: OR Start time: 03/06/2021 7:20 AM End time: 03/06/2021 7:25 AM Reason for block: surgical anesthesia Staffing Performed: resident/CRNA  Resident/CRNA: Nelda Marseille, CRNA Preanesthetic Checklist Completed: patient identified, IV checked, site marked, risks and benefits discussed, surgical consent, monitors and equipment checked, pre-op evaluation and timeout performed Spinal Block Patient position: sitting Prep: Betadine Patient monitoring: heart rate, continuous pulse ox, blood pressure and cardiac monitor Approach: midline Location: L4-5 Injection technique: single-shot Needle Needle type: Whitacre and Introducer  Needle gauge: 25 G Needle length: 9 cm Assessment Sensory level: T10 Events: CSF return Additional Notes Negative paresthesia. Negative blood return. Positive free-flowing CSF. Expiration date of kit checked and confirmed. Patient tolerated procedure well, without complications. Procedure completed by Marella Bile.

## 2021-03-06 NOTE — H&P (Signed)
Left knee pain   History of the Present Illness: Jacqueline Delgado is a 71 y.o. female for history and physical for left total knee arthroplasty with Dr. Hessie Knows on 03/06/2021. She has advanced left knee osteoarthritis. She has a history of prior right total knee arthroplasty, performed by Alysia Penna. Califf, MD, approximately 10 years ago. Patient states her left knee pain started prior to Thanksgiving in 08/2020. She reported left knee gave out on her, and she was in severe pain. At that time, she had to ambulate with a walker and has not been able to ambulate without an assistive device since. Her left knee pain is severe. She feels resting the knee is the only thing that helps alleviate the pain. She also is taking tramadol with some benefit. She notes difficulty extending her knee. If she turns the knee the wrong direction she experiences pain. Two years ago, Dr. Jefm Bryant discussed a partial knee arthroplasty; however, it has progressed since her radiographs in 2016.   I have reviewed past medical, surgical, social and family history, and allergies as documented in the EMR.  Past Medical History: Past Medical History:  Diagnosis Date  . Adenomatous colon polyp, unspecified 03/21/06  colonoscopy [facility]  . Anemia  . Cerebral vascular malformation 08/02/2014  . Chronic pain syndrome  . COVID-19 03/2020  . Depression  . GERD (gastroesophageal reflux disease)  . Hiatal hernia  . Hypertension  . Leukocytoclastic vasculitis (CMS-HCC)  . Non-cardiac chest pain  hospitalization 3/8 - 12/21/10, negative Myoview  . Obesity  . Osteoarthritis  a. Knees. b. Lumbar spine  . Sleep apnea  . Small vessel vasculitis (CMS-HCC)   Past Surgical History: Past Surgical History:  Procedure Laterality Date  . Arthroscopic subacromial decompression plus arthroscopic release of the long head of biceps tendon followed by mini incision rotator cuff repair Right 06/14/2015  Right Shoulder  . ARTHROSCOPY SHOULDER  Right 06/14/2015  RCR  . CARPAL TUNNEL RELEASE 01/08/12  . CHOLECYSTECTOMY  . COLONOSCOPY 03/21/2006  Adenomatous Polyp  . COLONOSCOPY 01/28/2011  PH Adenomatous Polyp: CBF 01/2016; Recall Ltr mailed 12/08/2015 (dw)  . COLONOSCOPY 12/10/2017  PH Adenomatous Polyp: CBF 11/2022  . EGD 04/27/2012, 01/28/2011, 03/03/2008, 10/03/2006, 01/21/1996  . EGD 10/11/2015  No repeat per RTE  . EGD 12/10/2017  No repeat per RTE  . HYSTERECTOMY VAGINAL  partial  . JOINT REPLACEMENT 06/06/11  knee  . OOPHORECTOMY Right  ovarian cyst  . trigger finger release Left 12/20/2015  middle finger   Past Family History: Family History  Problem Relation Age of Onset  . Breast cancer Mother  . Cancer Mother  . Stroke Father  . Heart disease Father  . Coronary Artery Disease (Blocked arteries around heart) Father  . Crohn's disease Sister  . Diabetes Sister  . Colon polyps Sister  . Aneurysm Sister  . Brain hemorrhage Sister  . Lupus Daughter  . Irritable bowel syndrome Daughter  . Other Son  Aging disease  . Diabetes Sister  . Stroke Sister   Medications: Current Outpatient Medications Ordered in Epic  Medication Sig Dispense Refill  . ACIPHEX 20 mg EC tablet Take 20 mg by mouth once daily  . amoxicillin-clavulanate (AUGMENTIN) 875-125 mg tablet Take 1 tablet (875 mg total) by mouth every 12 (twelve) hours for 5 days 10 tablet 0  . dulaglutide (TRULICITY) 1.61 WR/6.0 mL pen injector Inject 0.75 mg subcutaneously once a week  . famotidine (PEPCID) 40 MG tablet TAKE 1 TABLET (40 MG TOTAL) BY  MOUTH NIGHTLY TAKE 1 HOUR BEFORE BED. 90 tablet 2  . losartan (COZAAR) 50 MG tablet Take 50 mg by mouth once daily  . ondansetron (ZOFRAN-ODT) 4 MG disintegrating tablet Take 4 mg by mouth every 8 (eight) hours as needed  . oxybutynin (DITROPAN-XL) 10 MG XL tablet Take 10 mg by mouth once daily  . potassium chloride (KLOR-CON) 10 MEQ ER tablet Take 1 tablet by mouth once daily  . potassium chloride (KLOR-CON)  20 MEQ ER tablet Take 1 tablet (20 mEq total) by mouth 3 (three) times daily for 7 days 21 tablet 0  . TORsemide (DEMADEX) 20 MG tablet 20 mg 2 (two) times daily  . traMADoL (ULTRAM) 50 mg tablet Take 1 tablet (50 mg total) by mouth every 6 (six) hours as needed for Pain for up to 30 doses 30 tablet 1   No current Epic-ordered facility-administered medications on file.   Allergies: No Known Allergies   Body mass index is 44.63 kg/m.  Review of Systems: A comprehensive 14 point ROS was performed, reviewed, and the pertinent orthopaedic findings are documented in the HPI.  Vitals:  02/28/21 1522  BP: 118/76    General Physical Examination:  General:  Well developed, well nourished, no apparent distress, normal affect, presents in a wheelchair  HEENT: Head normocephalic, atraumatic, PERRL.   Abdomen: Soft, non tender, non distended, Bowel sounds present.  Heart: Examination of the heart reveals regular, rate, and rhythm. There is no murmur noted on ascultation. There is a normal apical pulse.  Lungs: Lungs are clear to auscultation. There is no wheeze, rhonchi, or crackles. There is normal expansion of bilateral chest walls.   Musculoskeletal Examination: On examination of the left knee, there is 15 degrees of extension and 90 degrees of flexion. On examination of the left hip, there is 20 degrees internal rotation and 40 degrees external.  Radiographs:  AP, lateral, standing, and sunrise x-rays of the left knee from 09/21/2020 were reviewed today. These show complete loss of medial joint space, varus deformities, osteophyte formation laterally and at the patellofemoral joint, Extensor subchondral sclerosis and osteophytes on the posterior aspect of the medial femoral condyle. Compared prior radiographs.   Assessment: ICD-10-CM  1. Primary osteoarthritis of left knee M17.12   Plan: 40. 70 year old female with progressive left knee pain. X-rays show advanced left knee  osteoarthritis. Pain interferes with quality of life and activities day living. Risks, benefits and complications of a left total knee arthroplasty have been discussed with the patient. Patient has agreed and consented procedure with Dr. Hessie Knows on 03/06/2021. Electronically signed by Feliberto Gottron, [place] at 02/28/2021 3:33 PM EDT  Back to top of Progress Notes  Plan of Treatment - documented as of this encounter  Upcoming Encounters Upcoming Encounters  Date Type Specialty Care Team Description  03/20/2021 [place], Karim Aiello Joseph, MD  [address]  [facility] [place], [place] [address]  [phone] (Work)  [phone] ([address])    Feliberto Gottron, [place]  [place], [place] [address]  [phone] (Work)  [phone] (Fax)     Reviewed  H+P. No changes noted.

## 2021-03-07 ENCOUNTER — Encounter: Payer: Self-pay | Admitting: Orthopedic Surgery

## 2021-03-07 LAB — CBC
HCT: 33.9 % — ABNORMAL LOW (ref 36.0–46.0)
Hemoglobin: 11 g/dL — ABNORMAL LOW (ref 12.0–15.0)
MCH: 25.9 pg — ABNORMAL LOW (ref 26.0–34.0)
MCHC: 32.4 g/dL (ref 30.0–36.0)
MCV: 79.8 fL — ABNORMAL LOW (ref 80.0–100.0)
Platelets: 262 10*3/uL (ref 150–400)
RBC: 4.25 MIL/uL (ref 3.87–5.11)
RDW: 16.5 % — ABNORMAL HIGH (ref 11.5–15.5)
WBC: 11.9 10*3/uL — ABNORMAL HIGH (ref 4.0–10.5)
nRBC: 0 % (ref 0.0–0.2)

## 2021-03-07 LAB — BASIC METABOLIC PANEL
Anion gap: 9 (ref 5–15)
BUN: 16 mg/dL (ref 8–23)
CO2: 28 mmol/L (ref 22–32)
Calcium: 9.1 mg/dL (ref 8.9–10.3)
Chloride: 100 mmol/L (ref 98–111)
Creatinine, Ser: 0.81 mg/dL (ref 0.44–1.00)
GFR, Estimated: >60 mL/min (ref >60)
Glucose, Bld: 115 mg/dL — ABNORMAL HIGH (ref 70–99)
Potassium: 3.3 mmol/L — ABNORMAL LOW (ref 3.5–5.1)
Sodium: 137 mmol/L (ref 135–145)

## 2021-03-07 MED ORDER — POTASSIUM CHLORIDE CRYS ER 20 MEQ PO TBCR
20.0000 meq | EXTENDED_RELEASE_TABLET | Freq: Two times a day (BID) | ORAL | Status: DC
  Administered 2021-03-07: 20 meq via ORAL
  Filled 2021-03-07: qty 1

## 2021-03-07 MED ORDER — POTASSIUM CHLORIDE CRYS ER 20 MEQ PO TBCR: 20.0000 meq | EXTENDED_RELEASE_TABLET | Freq: Two times a day (BID) | ORAL | Status: DC

## 2021-03-07 MED ORDER — POTASSIUM CHLORIDE 20 MEQ PO PACK
20.0000 meq | PACK | Freq: Four times a day (QID) | ORAL | Status: DC
  Filled 2021-03-07: qty 1

## 2021-03-07 MED ORDER — COVID-19 MRNA VAC-TRIS(PFIZER) 30 MCG/0.3ML IM SUSP
0.3000 mL | Freq: Once | INTRAMUSCULAR | Status: AC
  Administered 2021-03-07: 0.3 mL via INTRAMUSCULAR
  Filled 2021-03-07: qty 0.3

## 2021-03-07 MED ORDER — POTASSIUM CHLORIDE 20 MEQ PO PACK: 20.0000 meq | PACK | Freq: Three times a day (TID) | ORAL | Status: DC

## 2021-03-07 NOTE — TOC Progression Note (Signed)
Transition of Care [facility]) - Progression Note    Patient Details  Name: Jacqueline Delgado MRN: [id number] Date of Birth: [birth year]  Transition of Care [facility]) CM/SW [place], RN Phone Number: 03/07/2021, 3:05 PM  Clinical Narrative:    Reached out to facilities that are pending and requested that they take a look to make a bed offer, awaiting a responce       Expected Discharge Plan and Services                                                 Social Determinants of Health (SDOH) Interventions    Readmission Risk Interventions No flowsheet data found.

## 2021-03-07 NOTE — Progress Notes (Signed)
Physical Therapy Treatment Patient Details Name: Jacqueline Delgado MRN: [id number] DOB: 01-12-1951 Today's Date: 03/07/2021    History of Present Illness Pt is a 70 yo female s/p L TKA, WBAT. PMH of R TKA, chronic pain syndrome, hiatal hernia, leukocytoclastic vasculitis, R shoulder surgery.    PT Comments    Pt was sitting in recliner upon arriving. She agrees to PT session and is cooperative and pleasant throughout. Was able to improve abilities greatly however gets nauseous and vomits several times after gait training. She was able to stand with min assist to RW and ambulate 12ft prior to requesting to return to rm. Continues to require mod assist for bed mobility. Son present during session and will not be able to provide 24/7 assistance at home. Pt did demonstrate great improvements in one day however author still feels she will benefit from SNF at DC to address deficits while maximizing independence prior to returning home. RN was in room at conclusion of session with pt in bed with bed alarm set. Will return in morning and continue to follow per POC.     Follow Up Recommendations  SNF (pt does not have assistance at home throughout the day. If continues to make progress like she did today, maybe able to DC home with HHPT. At this time will need rehab for safety due to pt not being independent enough with ADLs.)     Equipment Recommendations  Other (comment) (ongoing assessment. has RW but would need BSC-3in1)    Recommendations for Other Services       Precautions / Restrictions Precautions Precautions: Fall Restrictions Weight Bearing Restrictions: Yes LLE Weight Bearing: Weight bearing as tolerated    Mobility  Bed Mobility Overal bed mobility: Needs Assistance Bed Mobility: Sit to Supine     Supine to sit: Mod assist;HOB elevated     General bed mobility comments: pt continues to require mod assist to exit and return to bed 2/2 to strength deficits and pain deficits     Transfers Overall transfer level: Needs assistance Equipment used: Rolling walker (2 wheeled) Transfers: Sit to/from Stand Sit to Stand: Min assist;From elevated surface         General transfer comment: Min assist to stand from elevated EOB surface  Ambulation/Gait Ambulation/Gait assistance: Supervision Gait Distance (Feet): 75 Feet Assistive device: Rolling walker (2 wheeled) Gait Pattern/deviations: Step-to pattern;Antalgic Gait velocity: decreased   General Gait Details: Pt was able to ambulate 58ft into hallway however was limited by nausea. Once back in room pt vomits 2 x. RN arrived and issued medication for nausea.      Balance Overall balance assessment: Needs assistance Sitting-balance support: Feet supported Sitting balance-Leahy Scale: Good Sitting balance - Comments: no LOB seated EOB with feet support only   Standing balance support: Bilateral upper extremity supported;During functional activity Standing balance-Leahy Scale: Good Standing balance comment: no LOB with UE support         Cognition Arousal/Alertness: Awake/alert Behavior During Therapy: WFL for tasks assessed/performed Overall Cognitive Status: Within Functional Limits for tasks assessed      General Comments: Pt is A and O x 4         General Comments General comments (skin integrity, edema, etc.): issued HEP however due to pt vomiting, did not have a chance to perform today. Reviewed importance of performing throughout the day      Pertinent Vitals/Pain Pain Assessment: 0-10 Pain Score: 8  Pain Intervention(s): Limited activity within patient's tolerance;Monitored during session;Premedicated before  session;Repositioned;Ice applied     PT Goals (current goals can now be found in the care plan section) Acute Rehab PT Goals Patient Stated Goal: rehab then home Progress towards PT goals: Progressing toward goals    Frequency    BID      PT Plan Current plan remains  appropriate       AM-PAC PT "6 Clicks" Mobility   Outcome Measure  Help needed turning from your back to your side while in a flat bed without using bedrails?: A Little Help needed moving from lying on your back to sitting on the side of a flat bed without using bedrails?: A Little Help needed moving to and from a bed to a chair (including a wheelchair)?: A Little Help needed standing up from a chair using your arms (e.g., wheelchair or bedside chair)?: A Little Help needed to walk in hospital room?: A Little Help needed climbing 3-5 steps with a railing? : A Lot 6 Click Score: 17    End of Session Equipment Utilized During Treatment: Gait belt Activity Tolerance: Patient tolerated treatment well;Patient limited by pain Patient left: in bed;with call bell/phone within reach;with bed alarm set;with family/visitor present Nurse Communication: Mobility status PT Visit Diagnosis: Other abnormalities of gait and mobility (R26.89);Muscle weakness (generalized) (M62.81);Difficulty in walking, not elsewhere classified (R26.2);Pain Pain - Right/Left: Left Pain - part of body: Knee     Time: 1694-5038 PT Time Calculation (min) (ACUTE ONLY): 41 min  Charges:  $Gait Training: 8-22 mins $Therapeutic Exercise: 8-22 mins $Therapeutic Activity: 8-22 mins                     Julaine Fusi PTA 03/07/21, 5:07 PM

## 2021-03-07 NOTE — Progress Notes (Signed)
Physical Therapy Treatment Patient Details Name: Jacqueline Delgado MRN: [id number] DOB: January 03, 1951 Today's Date: 03/07/2021    History of Present Illness Pt is a 70 yo female s/p L TKA, WBAT. PMH of R TKA, chronic pain syndrome, hiatal hernia, leukocytoclastic vasculitis, R shoulder surgery.    PT Comments    Pt was supine in bed upon arriving. Endorses feeling lethargic from pain medication. Does still endorse pain 9/10 with movements. Mod assist to exit bed. Min-mod to stand form elevated bed height. Ambulated 5 ft with very slow antalgic step to gait pattern. No LOB but limited by pain. She quickly starts falling asleep once in recliner. Will return later this afternoon to advance strength and ROM per pt tolerance. She was in recliner with chair alarm set and call bell in reach. Highly recommend DC to SNF to address deficits while assisting pt to PLOF.    Follow Up Recommendations  SNF     Equipment Recommendations  Other (comment) (defer to next level of care)       Precautions / Restrictions Precautions Precautions: Fall Restrictions Weight Bearing Restrictions: Yes LLE Weight Bearing: Weight bearing as tolerated    Mobility  Bed Mobility Overal bed mobility: Needs Assistance Bed Mobility: Supine to Sit     Supine to sit: Mod assist;HOB elevated     General bed mobility comments: required increased time and assistance throughout for truncal support and progression of operative LE    Transfers Overall transfer level: Needs assistance Equipment used: Rolling walker (2 wheeled) Transfers: Sit to/from Stand Sit to Stand: Min assist;From elevated surface         General transfer comment: Min assist to stand from elevated EOB surface  Ambulation/Gait Ambulation/Gait assistance: Min guard Gait Distance (Feet): 5 Feet Assistive device: Rolling walker (2 wheeled) Gait Pattern/deviations: Step-to pattern;Antalgic;Narrow base of support Gait velocity: decreased    General Gait Details: Pt was able to ambulate from EOB to recliner. difficulty advancing non operative LE due to wt on operative LE      Balance Overall balance assessment: Needs assistance Sitting-balance support: Feet supported Sitting balance-Leahy Scale: Good Sitting balance - Comments: no LOB seated EOB with feet support only   Standing balance support: Bilateral upper extremity supported;During functional activity Standing balance-Leahy Scale: Good Standing balance comment: no LOB in       Cognition Arousal/Alertness: Awake/alert Behavior During Therapy: WFL for tasks assessed/performed Overall Cognitive Status: Within Functional Limits for tasks assessed      General Comments: Pt is A and O x 4             Pertinent Vitals/Pain Pain Assessment: 0-10 Pain Score: 9  Pain Location: 9/10 pain with movements Pain Descriptors / Indicators: Aching;Guarding;Grimacing;Sore Pain Intervention(s): Limited activity within patient's tolerance;Monitored during session;Premedicated before session;Repositioned;Ice applied           PT Goals (current goals can now be found in the care plan section) Acute Rehab PT Goals Patient Stated Goal: rehab then home Progress towards PT goals: Progressing toward goals    Frequency    BID      PT Plan Current plan remains appropriate       AM-PAC PT "6 Clicks" Mobility   Outcome Measure  Help needed turning from your back to your side while in a flat bed without using bedrails?: A Lot Help needed moving from lying on your back to sitting on the side of a flat bed without using bedrails?: A Lot Help needed moving to  and from a bed to a chair (including a wheelchair)?: A Lot Help needed standing up from a chair using your arms (e.g., wheelchair or bedside chair)?: A Lot Help needed to walk in hospital room?: A Lot Help needed climbing 3-5 steps with a railing? : A Lot 6 Click Score: 12    End of Session Equipment Utilized  During Treatment: Gait belt Activity Tolerance: Patient tolerated treatment well;Patient limited by pain Patient left: with call bell/phone within reach;with chair alarm set;in chair Nurse Communication: Mobility status PT Visit Diagnosis: Other abnormalities of gait and mobility (R26.89);Muscle weakness (generalized) (M62.81);Difficulty in walking, not elsewhere classified (R26.2);Pain Pain - Right/Left: Left Pain - part of body: Knee     Time: 2984-7308 PT Time Calculation (min) (ACUTE ONLY): 25 min  Charges:  $Gait Training: 8-22 mins $Therapeutic Activity: 8-22 mins                     Julaine Fusi PTA 03/07/21, 12:40 PM

## 2021-03-07 NOTE — Anesthesia Postprocedure Evaluation (Signed)
Anesthesia Post Note  Patient: Jacqueline Delgado  Procedure(s) Performed: TOTAL KNEE ARTHROPLASTY - Rachelle Hora to Assist (Left Knee)  Patient location during evaluation: Nursing Unit Anesthesia Type: Spinal Level of consciousness: oriented and awake and alert Pain management: pain level controlled Vital Signs Assessment: post-procedure vital signs reviewed and stable Respiratory status: spontaneous breathing and respiratory function stable Cardiovascular status: blood pressure returned to baseline and stable Postop Assessment: no headache, no backache, no apparent nausea or vomiting and patient able to bend at knees Anesthetic complications: no   No complications documented.   Last Vitals:  Vitals:   03/06/21 2041 03/07/21 0401  BP: 131/80 (!) 149/88  Pulse: 67 (!) 56  Resp: 18 16  Temp: 36.4 C 36.6 C  SpO2: 96% 95%    Last Pain:  Vitals:   03/07/21 0722  TempSrc:   PainSc: [place]

## 2021-03-07 NOTE — NC FL2 (Signed)
[place] LEVEL OF CARE SCREENING TOOL     IDENTIFICATION  Patient Name: Jacqueline Delgado Birthdate: Apr 04, 1951 Sex: female Admission Date (Current Location): 03/06/2021  [place] and [place] Number:  Engineering geologist and Address:  [facility], [address] [place], [place] [address]      Provider Number: [id number]  Attending Physician Name and Address:  Hessie Knows, MD  Relative Name and Phone Number:  Laureen Abrahams [phone]    Current Level of Care: Hospital Recommended Level of Care: [place] Prior Approval Number:    Date Approved/Denied:   PASRR Number: [id number]  Discharge Plan: SNF    Current Diagnoses: Patient Active Problem List   Diagnosis Date Noted  . S/P TKR (total knee replacement) using cement, left 03/06/2021  . Nasal congestion 02/21/2021  . OAB (overactive bladder) 01/16/2021  . Uncomplicated opioid dependence ([place]) 10/04/2020  . Chronic low back pain (Bilateral) (R>L) w/o sciatica 09/18/2020  . Spondylosis without myelopathy or radiculopathy, lumbosacral region 09/18/2020  . Lumbosacral facet syndrome (Bilateral) (R>L) 08/23/2020  . Loose stools 08/21/2020  . BMI 50.0-59.9, adult ([place]) 08/21/2020  . Hypokalemia 07/18/2020  . Hypocalcemia 07/18/2020  . Hypoalbuminemia 07/18/2020  . Chronic lower extremity pain (2ry area of Pain) (Bilateral) (R>L) 05/23/2020  . Class 3 obesity with alveolar hypoventilation, serious comorbidity, and body mass index (BMI) of 50.0 to 59.9 in adult ([place]) 05/23/2020  . Elevated C-reactive protein (CRP) 05/23/2020  . Elevated sed rate 05/23/2020  . Calcaneal spur of foot (Right) 05/23/2020  . Lumbar Grade 1 Anterolisthesis of L4/L5 05/23/2020  . Chronic pain syndrome 05/22/2020  . Pharmacologic therapy 05/22/2020  . Disorder of skeletal system 05/22/2020  . Problems influencing health status 05/22/2020  . DDD (degenerative disc disease), lumbosacral  05/22/2020  . Lumbar facet hypertrophy 05/22/2020  . Abnormal MRI, lumbar spine (04/12/2013) 05/22/2020  . Lumbosacral central spinal stenosis (9 mm) (L4-5 and L5-S1) 05/22/2020  . Chronic lower extremity pain (Right) 05/22/2020  . Lumbosacral radiculopathy at L5 (Right) 05/22/2020  . Chronic knee pain (3ry area of Pain) (Left) 05/22/2020  . Swelling of foot (4th area of Pain) (Right) 05/22/2020  . Pneumonia due to COVID-19 virus 04/09/2020  . Ankle swelling, right 04/03/2020  . Hiatal hernia 02/18/2020  . Small vessel vasculitis ([place]) 02/18/2020  . Non-cardiac chest pain 02/18/2020  . Vaginal discharge 10/01/2019  . SOB (shortness of breath) on exertion 09/23/2019  . Anemia 09/23/2019  . B12 deficiency 09/23/2019  . Vitamin D deficiency 09/23/2019  . Hematuria 05/14/2019  . Hot flashes 05/14/2019  . Screening for breast cancer 05/14/2019  . Skin fissures 04/22/2019  . Fatigue 12/09/2018  . Localized swelling of finger of left hand 11/18/2018  . Cough 11/11/2018  . Genital herpes simplex 11/11/2018  . Essential hypertension 11/11/2018  . Plantar fasciitis 05/03/2016  . OSA (obstructive sleep apnea) 04/23/2016  . Acquired trigger finger 11/23/2015  . Depression, major, recurrent, moderate ([place]) 07/06/2015  . Closed nondisplaced fracture of base of fifth metacarpal bone of right hand 06/22/2015  . S/P rotator cuff repair 06/22/2015  . Complete tear of right rotator cuff 05/23/2015  . Osteoarthritis of knee (Left) 02/22/2015  . DDD (degenerative disc disease), cervical 08/03/2014  . DDD (degenerative disc disease), lumbar 08/03/2014  . Leukocytoclastic vasculitis ([place]) 08/03/2014  . Neck pain 08/03/2014  . Cerebral vascular malformation 08/02/2014  . Unspecified osteoarthritis, unspecified site 07/05/2014  . GERD (gastroesophageal reflux disease) 09/20/2013  . Chronic low back  pain (1ry area of Pain) (Bilateral) (R>L) w/ sciatica (Right) 09/20/2013  . Osteoarthritis, knee  09/20/2013  . Essential (primary) hypertension 09/20/2013  . Adenomatous colon polyp 03/21/2006    Orientation RESPIRATION BLADDER Height & Weight     Self,Time,Situation,Place  Normal Continent Weight: 118.4 kg Height:  5\' 2"  (157.5 cm)  BEHAVIORAL SYMPTOMS/MOOD NEUROLOGICAL BOWEL NUTRITION STATUS      Continent Diet (regular)  AMBULATORY STATUS COMMUNICATION OF NEEDS Skin   Extensive Assist Verbally Surgical wounds                       Personal Care Assistance Level of Assistance  Bathing,Dressing Bathing Assistance: Limited assistance   Dressing Assistance: Limited assistance     Functional Limitations Info             SPECIAL CARE FACTORS FREQUENCY  PT (By licensed PT)     PT Frequency: 5 times per week              Contractures Contractures Info: Not present    Additional Factors Info  Code Status,Allergies Code Status Info: Full code Allergies Info: NKDA           Current Medications (03/07/2021):  This is the current hospital active medication list Current Facility-Administered Medications  Medication Dose Route Frequency Provider Last Rate Last Admin  . 0.9 %  sodium chloride infusion   Intravenous Continuous Hessie Knows, MD 75 mL/hr at 03/07/21 0325 Infusion Verify at 03/07/21 0325  . acetaminophen (TYLENOL) tablet 325-650 mg  325-650 mg Oral Q6H PRN Hessie Knows, MD      . alum & mag hydroxide-simeth (MAALOX/MYLANTA) 200-200-20 MG/5ML suspension 30 mL  30 mL Oral Q4H PRN Hessie Knows, MD   30 mL at 03/07/21 0329  . bisacodyl (DULCOLAX) suppository 10 mg  10 mg Rectal Daily PRN Hessie Knows, MD      . COVID-19 mRNA Vac-TriS (Pfizer) injection 0.3 mL  0.3 mL Intramuscular Once Hessie Knows, MD      . diphenhydrAMINE (BENADRYL) 12.5 MG/5ML elixir 12.5-25 mg  12.5-25 mg Oral Q4H PRN Hessie Knows, MD      . docusate sodium (COLACE) capsule 100 mg  100 mg Oral BID Hessie Knows, MD   100 mg at 03/07/21 0815  . enoxaparin (LOVENOX) injection  30 mg  30 mg Subcutaneous Q12H Hessie Knows, MD   30 mg at 03/07/21 0815  . famotidine (PEPCID) tablet 40 mg  40 mg Oral QHS Hessie Knows, MD   40 mg at 03/06/21 2127  . fluticasone (FLONASE) 50 MCG/ACT nasal spray 2 spray  2 spray Each Nare Daily PRN Hessie Knows, MD      . hydrochlorothiazide (HYDRODIURIL) tablet 25 mg  25 mg Oral Daily Hessie Knows, MD   25 mg at 03/06/21 1203  . HYDROmorphone (DILAUDID) injection 0.5-1 mg  0.5-1 mg Intravenous Q4H PRN Hessie Knows, MD   1 mg at 03/07/21 0814  . losartan (COZAAR) tablet 50 mg  50 mg Oral QPC lunch Hessie Knows, MD   50 mg at 03/06/21 1203  . magnesium citrate solution 1 Bottle  1 Bottle Oral Once PRN Hessie Knows, MD      . menthol-cetylpyridinium (CEPACOL) lozenge 3 mg  1 lozenge Oral PRN Hessie Knows, MD       Or  . phenol (CHLORASEPTIC) mouth spray 1 spray  1 spray Mouth/Throat PRN Hessie Knows, MD      . methocarbamol (ROBAXIN) tablet 500 mg  500 mg  Oral Q6H PRN Hessie Knows, MD   500 mg at 03/06/21 1732   Or  . methocarbamol (ROBAXIN) 500 mg in dextrose 5 % 50 mL IVPB  500 mg Intravenous Q6H PRN Hessie Knows, MD      . metoCLOPramide (REGLAN) tablet 5-10 mg  5-10 mg Oral Q8H PRN Hessie Knows, MD   10 mg at 03/07/21 5621   Or  . metoCLOPramide (REGLAN) injection 5-10 mg  5-10 mg Intravenous Q8H PRN Hessie Knows, MD   5 mg at 03/06/21 2127  . ondansetron (ZOFRAN) tablet 4 mg  4 mg Oral Q6H PRN Hessie Knows, MD       Or  . ondansetron [facility]) injection 4 mg  4 mg Intravenous Q6H PRN Hessie Knows, MD   4 mg at 03/06/21 1549  . oxybutynin (DITROPAN-XL) 24 hr tablet 10 mg  10 mg Oral QHS Hessie Knows, MD      . oxyCODONE (Oxy IR/ROXICODONE) immediate release tablet 10-15 mg  10-15 mg Oral Q4H PRN Hessie Knows, MD   10 mg at 03/07/21 3086  . oxyCODONE (Oxy IR/ROXICODONE) immediate release tablet 5-10 mg  5-10 mg Oral Q4H PRN Hessie Knows, MD   5 mg at 03/06/21 1512  . pantoprazole (PROTONIX) EC tablet 40 mg  40 mg Oral Daily  Hessie Knows, MD   40 mg at 03/07/21 0815  . polyethylene glycol (MIRALAX / GLYCOLAX) packet 17 g  17 g Oral Daily PRN Hessie Knows, MD   17 g at 03/07/21 0817  . potassium chloride (KLOR-CON) packet 20 mEq  20 mEq Oral QID Duanne Guess, PA-C      . traMADol Veatrice Bourbon) tablet 50 mg  50 mg Oral Q6H Hessie Knows, MD   50 mg at 03/07/21 5784  . zolpidem (AMBIEN) tablet 5 mg  5 mg Oral QHS PRN Hessie Knows, MD   5 mg at 03/06/21 2126     Discharge Medications: Please see discharge summary for a list of discharge medications.  Relevant Imaging Results:  Relevant Lab Results:   Additional Information SS# [id number]  Su Hilt, RN

## 2021-03-07 NOTE — Progress Notes (Signed)
   Subjective: 1 Day Post-Op Procedure(s) (LRB): TOTAL KNEE ARTHROPLASTY - Rachelle Hora to Assist (Left) Patient reports pain as moderate.   Patient is well, and has had no acute complaints or problems Denies any CP, SOB, ABD pain. We will continue therapy today.  Plan is to go Skilled nursing facility after hospital stay.  Objective: Vital signs in last 24 hours: Temp:  [97 F (36.1 C)-97.9 F (36.6 C)] 97.6 F (36.4 C) (05/25 0809) Pulse Rate:  [48-94] 61 (05/25 0809) Resp:  [15-20] 16 (05/25 0809) BP: (94-157)/(63-93) 142/78 (05/25 0809) SpO2:  [92 %-100 %] 93 % (05/25 0809)  Intake/Output from previous day: 05/24 0701 - 05/25 0700 In: 1838.3 [P.O.:60; I.V.:1778.3] Out: 1270 [Urine:1250; Blood:20] Intake/Output this shift: No intake/output data recorded.  Recent Labs    03/06/21 0704 03/06/21 1220 03/07/21 0709  HGB 10.9* 11.0* 11.0*   Recent Labs    03/06/21 1220 03/07/21 0709  WBC 7.2 11.9*  RBC 4.31 4.25  HCT 35.1* 33.9*  PLT 222 262   Recent Labs    03/06/21 0704 03/06/21 1220 03/07/21 0709  NA 142  --  137  K 3.0*  --  3.3*  CL 104  --  100  CO2  --   --  28  BUN 18  --  16  CREATININE 1.00 0.96 0.81  GLUCOSE 119*  --  115*  CALCIUM  --   --  9.1   No results for input(s): LABPT, INR in the last 72 hours.  EXAM General - Patient is Alert, Appropriate and Oriented Extremity - Neurovascular intact Sensation intact distally Intact pulses distally Dorsiflexion/Plantar flexion intact No cellulitis present Compartment soft Dressing - dressing C/D/I and no drainage, Prevena intact with 20 cc of serous bloody drainage Motor Function - intact, moving foot and toes well on exam.   Past Medical History:  Diagnosis Date  . Anemia    H/O  . Anginal pain ([place])     3/8-12/21/10  . Arthritis    Osteoarthritis in BLE knee  . Colon polyp   . GERD (gastroesophageal reflux disease)   . Hiatal hernia   . History of hiatal hernia   . History of  methicillin resistant staphylococcus aureus (MRSA)   . Hypertension    controlled  . Obesity   . Reflux   . Sleep apnea    NO CPAP-SLEEP STUDY FROM 2021 WAS NEGATIVE PER PT  . Small vessel disease ([facility])     Assessment/Plan:   1 Day Post-Op Procedure(s) (LRB): TOTAL KNEE ARTHROPLASTY - Rachelle Hora to Assist (Left) Active Problems:   S/P TKR (total knee replacement) using cement, left  Estimated body mass index is 47.74 kg/m as calculated from the following:   Height as of this encounter: 5\' 2"  (1.575 m).   Weight as of this encounter: 118.4 kg. Advance diet Up with therapy  Work on bowel movement Vital signs are stable Hypokalemia -replace with oral potassium Recheck labs in the morning Pain well controlled Care manager to assist with discharge to skilled nursing facility  DVT Prophylaxis - Lovenox, TED hose and SCDs Weight-Bearing as tolerated to left leg   T. Rachelle Hora, PA-C [place] 03/07/2021, 8:10 AM

## 2021-03-08 LAB — CBC
HCT: 32.9 % — ABNORMAL LOW (ref 36.0–46.0)
Hemoglobin: 10.4 g/dL — ABNORMAL LOW (ref 12.0–15.0)
MCH: 25.3 pg — ABNORMAL LOW (ref 26.0–34.0)
MCHC: 31.6 g/dL (ref 30.0–36.0)
MCV: 80 fL (ref 80.0–100.0)
Platelets: 208 10*3/uL (ref 150–400)
RBC: 4.11 MIL/uL (ref 3.87–5.11)
RDW: 16.4 % — ABNORMAL HIGH (ref 11.5–15.5)
WBC: 8.7 10*3/uL (ref 4.0–10.5)
nRBC: 0 % (ref 0.0–0.2)

## 2021-03-08 LAB — BASIC METABOLIC PANEL
Anion gap: 9 (ref 5–15)
BUN: 14 mg/dL (ref 8–23)
CO2: 28 mmol/L (ref 22–32)
Calcium: 9.2 mg/dL (ref 8.9–10.3)
Chloride: 100 mmol/L (ref 98–111)
Creatinine, Ser: 0.87 mg/dL (ref 0.44–1.00)
GFR, Estimated: >60 mL/min (ref >60)
Glucose, Bld: 116 mg/dL — ABNORMAL HIGH (ref 70–99)
Potassium: 3.4 mmol/L — ABNORMAL LOW (ref 3.5–5.1)
Sodium: 137 mmol/L (ref 135–145)

## 2021-03-08 MED ORDER — DULAGLUTIDE 0.75 MG/0.5ML ~~LOC~~ SOAJ: 0.7500 mg | SUBCUTANEOUS | Status: DC

## 2021-03-08 MED ORDER — POTASSIUM CITRATE ER 10 MEQ (1080 MG) PO TBCR
20.0000 meq | EXTENDED_RELEASE_TABLET | Freq: Three times a day (TID) | ORAL | Status: DC
  Administered 2021-03-08 – 2021-03-12 (×14): 20 meq via ORAL
  Filled 2021-03-08 (×18): qty 2

## 2021-03-08 NOTE — Progress Notes (Signed)
   Subjective: 2 Days Post-Op Procedure(s) (LRB): TOTAL KNEE ARTHROPLASTY - Rachelle Hora to Assist (Left) Patient reports pain as moderate to severe Patient is well, and has had no acute complaints or problems Denies any CP, SOB, ABD pain. We will continue therapy today.  Plan is to go Skilled nursing facility after hospital stay.  Objective: Vital signs in last 24 hours: Temp:  [97.7 F (36.5 C)-99 F (37.2 C)] 99 F (37.2 C) (05/26 0509) Pulse Rate:  [69-105] 105 (05/26 0509) Resp:  [16] 16 (05/26 0509) BP: (115-133)/(76-82) 115/80 (05/26 0509) SpO2:  [94 %-96 %] 95 % (05/26 0509)  Intake/Output from previous day: 05/25 0701 - 05/26 0700 In: 240 [P.O.:240] Out: 60 [Drains:60] Intake/Output this shift: No intake/output data recorded.  Recent Labs    03/06/21 0704 03/06/21 1220 03/07/21 0709 03/08/21 0410  HGB 10.9* 11.0* 11.0* 10.4*   Recent Labs    03/07/21 0709 03/08/21 0410  WBC 11.9* 8.7  RBC 4.25 4.11  HCT 33.9* 32.9*  PLT 262 208   Recent Labs    03/07/21 0709 03/08/21 0410  NA 137 137  K 3.3* 3.4*  CL 100 100  CO2 28 28  BUN 16 14  CREATININE 0.81 0.87  GLUCOSE 115* 116*  CALCIUM 9.1 9.2   No results for input(s): LABPT, INR in the last 72 hours.  EXAM General - Patient is Alert, Appropriate and Oriented Extremity - Neurovascular intact Sensation intact distally Intact pulses distally Dorsiflexion/Plantar flexion intact No cellulitis present Compartment soft Dressing - dressing C/D/I and no drainage, Prevena intact with 50 cc of serous bloody drainage Motor Function - intact, moving foot and toes well on exam.   Past Medical History:  Diagnosis Date  . Anemia    H/O  . Anginal pain ([place])     3/8-12/21/10  . Arthritis    Osteoarthritis in BLE knee  . Colon polyp   . GERD (gastroesophageal reflux disease)   . Hiatal hernia   . History of hiatal hernia   . History of methicillin resistant staphylococcus aureus (MRSA)   .  Hypertension    controlled  . Obesity   . Reflux   . Sleep apnea    NO CPAP-SLEEP STUDY FROM 2021 WAS NEGATIVE PER PT  . Small vessel disease ([place])     Assessment/Plan:   2 Days Post-Op Procedure(s) (LRB): TOTAL KNEE ARTHROPLASTY - Rachelle Hora to Assist (Left) Active Problems:   S/P TKR (total knee replacement) using cement, left  Estimated body mass index is 47.74 kg/m as calculated from the following:   Height as of this encounter: 5\' 2"  (1.575 m).   Weight as of this encounter: 118.4 kg. Advance diet Up with therapy  Work on bowel movement Vital signs are stable Hypokalemia -replace with oral potassium Recheck labs in the morning Pain severe, no meds since 9:40 pm last night. Discussed pain regimen with nurse and patient. Will try to achieve better pain control with current pain regimen today. Care manager to assist with discharge to skilled nursing facility  DVT Prophylaxis - Lovenox, TED hose and SCDs Weight-Bearing as tolerated to left leg   T. Rachelle Hora, PA-C [place] 03/08/2021, 8:11 AM

## 2021-03-08 NOTE — Progress Notes (Signed)
Physical Therapy Treatment Patient Details Name: Jacqueline Delgado MRN: [id number] DOB: [birth year] Today's Date: 03/08/2021    History of Present Illness Pt is a 70 yo female s/p L TKA, WBAT. PMH of R TKA, chronic pain syndrome, hiatal hernia, leukocytoclastic vasculitis, R shoulder surgery.    PT Comments    Pt was long sitting in bed upon arriving. She agrees to session and is cooperative and motivated throughout. Requested to not ambulate this session due to pain but was willing to get OOB to recliner and perform stretching/ther ex. Tolerated there ex well and was able to advance ROM to 85 degrees flexion with assistance. Overall pt is improving but will benefit from SNF due to lack of assistance at home. Pt will benefit form skilled rehab to address deficits with strength, ROM, and overall functional mobility deficits.    Follow Up Recommendations  SNF     Equipment Recommendations  None recommended by PT       Precautions / Restrictions Precautions Precautions: Fall Restrictions Weight Bearing Restrictions: Yes LLE Weight Bearing: Weight bearing as tolerated    Mobility  Bed Mobility Overal bed mobility: Needs Assistance Bed Mobility: Supine to Sit     Supine to sit: Mod assist;HOB elevated     General bed mobility comments: mod assist to safely exit bed with increased time and vcs    Transfers Overall transfer level: Needs assistance Equipment used: Rolling walker (2 wheeled) Transfers: Sit to/from Stand Sit to Stand: Min assist         General transfer comment: min assist to stand form elevated bed height  Ambulation/Gait Ambulation/Gait assistance: Min guard Gait Distance (Feet): 5 Feet Assistive device: Rolling walker (2 wheeled) Gait Pattern/deviations: Step-to pattern;Antalgic Gait velocity: decreased   General Gait Details: pt ambulated 5 ft with RW. Pain limited however pt did agree to performing ther ex once in recliner       Balance Overall  balance assessment: Needs assistance Sitting-balance support: Feet supported Sitting balance-Leahy Scale: Good Sitting balance - Comments: no balance deficits in sitting   Standing balance support: Bilateral upper extremity supported;During functional activity Standing balance-Leahy Scale: Good Standing balance comment: does rely on UE support during standing         Cognition Arousal/Alertness: Awake/alert Behavior During Therapy: WFL for tasks assessed/performed Overall Cognitive Status: Within Functional Limits for tasks assessed    General Comments: Pt is A and O x 4      Exercises Total Joint Exercises Ankle Circles/Pumps: AROM;Both;10 reps Quad Sets: AROM;10 reps;Both Heel Slides: AAROM;Left;10 reps Hip ABduction/ADduction: AAROM;Left;10 reps Straight Leg Raises: Left;5 reps;AAROM Goniometric ROM: 85 degrees flexion     Pertinent Vitals/Pain Pain Assessment: 0-10 Pain Score: 8  Pain Location: Knee with wt bearing Pain Descriptors / Indicators: Aching;Guarding;Grimacing;Sore Pain Intervention(s): Limited activity within patient's tolerance           PT Goals (current goals can now be found in the care plan section) Acute Rehab PT Goals Patient Stated Goal: rehab then home Progress towards PT goals: Progressing toward goals    Frequency    BID      PT Plan Current plan remains appropriate       AM-PAC PT "6 Clicks" Mobility   Outcome Measure  Help needed turning from your back to your side while in a flat bed without using bedrails?: A Little Help needed moving from lying on your back to sitting on the side of a flat bed without using bedrails?: A Little Help  needed moving to and from a bed to a chair (including a wheelchair)?: A Little Help needed standing up from a chair using your arms (e.g., wheelchair or bedside chair)?: A Little Help needed to walk in hospital room?: A Little Help needed climbing 3-5 steps with a railing? : A Little 6 Click  Score: 18    End of Session Equipment Utilized During Treatment: Gait belt Activity Tolerance: Patient tolerated treatment well;No increased pain;Patient limited by fatigue Patient left: in chair;with call bell/phone within reach;with chair alarm set Nurse Communication: Mobility status PT Visit Diagnosis: Other abnormalities of gait and mobility (R26.89);Muscle weakness (generalized) (M62.81);Difficulty in walking, not elsewhere classified (R26.2);Pain Pain - Right/Left: Left Pain - part of body: Knee     Time: 6922-3009 PT Time Calculation (min) (ACUTE ONLY): 23 min  Charges:  $Gait Training: 8-22 mins $Therapeutic Exercise: 8-22 mins $Therapeutic Activity: 8-22 mins                    Julaine Fusi PTA 03/08/21, 4:53 PM

## 2021-03-08 NOTE — Progress Notes (Signed)
Physical Therapy Treatment Patient Details Name: Jacqueline Delgado MRN: [id number] DOB: Dec 06, 1950 Today's Date: 03/08/2021    History of Present Illness Pt is a 70 yo female s/p L TKA, WBAT. PMH of R TKA, chronic pain syndrome, hiatal hernia, leukocytoclastic vasculitis, R shoulder surgery.    PT Comments    Pt was sitting in recliner upon arriving. Agrees to session with minimal encouragement. Once motivated was cooperative and pleasant throughout. Does continue to demonstrate improving functional mobility, transfers, and gait but still requiring assistance and will need rehab for safety. Recommend DC to SNF to address deficits while assisting pt to PLOF.   Follow Up Recommendations  SNF;Other (comment) (pt does not have assistance at home)     Equipment Recommendations  Other (comment) (defer to next level of care)       Precautions / Restrictions Precautions Precautions: Fall Restrictions Weight Bearing Restrictions: Yes LLE Weight Bearing: Weight bearing as tolerated    Mobility  Bed Mobility    General bed mobility comments: pt on BSC at conclusion of session    Transfers Overall transfer level: Needs assistance Equipment used: Rolling walker (2 wheeled) Transfers: Sit to/from Stand Sit to Stand: Min assist         General transfer comment: Min assist + vcs for improved technique, sequencing, and safet.  Ambulation/Gait Ambulation/Gait assistance: Supervision Gait Distance (Feet): 100 Feet Assistive device: Rolling walker (2 wheeled) Gait Pattern/deviations: Step-to pattern;Antalgic Gait velocity: decreased   General Gait Details: Pt ambulated 100 ft with RW with slow step to antalgic gait pattern      Balance Overall balance assessment: Needs assistance Sitting-balance support: Feet supported Sitting balance-Leahy Scale: Good Sitting balance - Comments: no balance deficits in sitting   Standing balance support: Bilateral upper extremity  supported;During functional activity Standing balance-Leahy Scale: Good Standing balance comment: does rely on UE support during standing        Cognition Arousal/Alertness: Awake/alert Behavior During Therapy: WFL for tasks assessed/performed Overall Cognitive Status: Within Functional Limits for tasks assessed        General Comments: Pt is A and O x 4             Pertinent Vitals/Pain Pain Assessment: 0-10 Pain Score: 6  Pain Location: Knee with wt bearing Pain Descriptors / Indicators: Aching;Guarding;Grimacing;Sore Pain Intervention(s): Limited activity within patient's tolerance;Monitored during session;Premedicated before session;Repositioned;Ice applied           PT Goals (current goals can now be found in the care plan section) Acute Rehab PT Goals Patient Stated Goal: rehab then home Progress towards PT goals: Progressing toward goals    Frequency    BID      PT Plan Current plan remains appropriate       AM-PAC PT "6 Clicks" Mobility   Outcome Measure  Help needed turning from your back to your side while in a flat bed without using bedrails?: A Little Help needed moving from lying on your back to sitting on the side of a flat bed without using bedrails?: A Little Help needed moving to and from a bed to a chair (including a wheelchair)?: A Little Help needed standing up from a chair using your arms (e.g., wheelchair or bedside chair)?: A Little Help needed to walk in hospital room?: A Little Help needed climbing 3-5 steps with a railing? : A Lot 6 Click Score: 17    End of Session Equipment Utilized During Treatment: Gait belt Activity Tolerance: Patient tolerated treatment well;No increased  pain;Patient limited by fatigue Patient left: Other (comment) (pt was on [facility] with RN present) Nurse Communication: Mobility status PT Visit Diagnosis: Other abnormalities of gait and mobility (R26.89);Muscle weakness (generalized) (M62.81);Difficulty in  walking, not elsewhere classified (R26.2);Pain Pain - Right/Left: Left Pain - part of body: Knee     Time: 1455-1515 PT Time Calculation (min) (ACUTE ONLY): 20 min  Charges:  $Gait Training: 8-22 mins                     Julaine Fusi PTA 03/08/21, 4:35 PM

## 2021-03-08 NOTE — TOC Progression Note (Signed)
Transition of Care [facility]) - Progression Note    Patient Details  Name: Jacqueline Delgado MRN: [id number] Date of Birth: December 16, 1950  [facility]) CM/SW [place], RN Phone Number: 03/08/2021, 10:28 AM  Clinical Narrative:     Spoke with the patient and reviewed the bed offers, she accepted the bed offer from [facility], I notified Tammy and accepted in the hub also, Started ins auth process at [facility] health      Expected Discharge Plan and Services                                                 Social Determinants of Health (SDOH) Interventions    Readmission Risk Interventions No flowsheet data found.

## 2021-03-09 ENCOUNTER — Other Ambulatory Visit: Payer: Self-pay | Admitting: Family

## 2021-03-09 DIAGNOSIS — Z6841 Body Mass Index (BMI) 40.0 and over, adult: Secondary | ICD-10-CM

## 2021-03-09 DIAGNOSIS — E662 Morbid (severe) obesity with alveolar hypoventilation: Secondary | ICD-10-CM

## 2021-03-09 LAB — BASIC METABOLIC PANEL
Anion gap: 7 (ref 5–15)
BUN: 13 mg/dL (ref 8–23)
CO2: 30 mmol/L (ref 22–32)
Calcium: 9.1 mg/dL (ref 8.9–10.3)
Chloride: 98 mmol/L (ref 98–111)
Creatinine, Ser: 0.95 mg/dL (ref 0.44–1.00)
GFR, Estimated: >60 mL/min (ref >60)
Glucose, Bld: 113 mg/dL — ABNORMAL HIGH (ref 70–99)
Potassium: 3.9 mmol/L (ref 3.5–5.1)
Sodium: 135 mmol/L (ref 135–145)

## 2021-03-09 LAB — CBC
HCT: 31.9 % — ABNORMAL LOW (ref 36.0–46.0)
Hemoglobin: 10.2 g/dL — ABNORMAL LOW (ref 12.0–15.0)
MCH: 25.8 pg — ABNORMAL LOW (ref 26.0–34.0)
MCHC: 32 g/dL (ref 30.0–36.0)
MCV: 80.6 fL (ref 80.0–100.0)
Platelets: 220 10*3/uL (ref 150–400)
RBC: 3.96 MIL/uL (ref 3.87–5.11)
RDW: 16.5 % — ABNORMAL HIGH (ref 11.5–15.5)
WBC: 8.3 10*3/uL (ref 4.0–10.5)
nRBC: 0 % (ref 0.0–0.2)

## 2021-03-09 LAB — RESP PANEL BY RT-PCR (FLU A&B, COVID) ARPGX2
Influenza A by PCR: NEGATIVE
Influenza B by PCR: NEGATIVE
SARS Coronavirus 2 by RT PCR: NEGATIVE

## 2021-03-09 MED ORDER — OXYCODONE HCL 5 MG PO TABS: 5.0000 mg | ORAL_TABLET | ORAL | 0 refills | Status: DC | PRN

## 2021-03-09 MED ORDER — DOCUSATE SODIUM 100 MG PO CAPS: 100.0000 mg | ORAL_CAPSULE | Freq: Two times a day (BID) | ORAL | 0 refills | Status: DC

## 2021-03-09 MED ORDER — SODIUM CHLORIDE 0.9 % IV BOLUS
1000.0000 mL | Freq: Once | INTRAVENOUS | Status: AC
  Administered 2021-03-09: 1000 mL via INTRAVENOUS

## 2021-03-09 MED ORDER — ACETAMINOPHEN 500 MG PO TABS: 500.0000 mg | ORAL_TABLET | Freq: Four times a day (QID) | ORAL | Status: DC | PRN

## 2021-03-09 MED ORDER — BISACODYL 10 MG RE SUPP
10.0000 mg | Freq: Once | RECTAL | Status: AC
  Administered 2021-03-12: 10 mg via RECTAL
  Filled 2021-03-09: qty 1

## 2021-03-09 MED ORDER — METHOCARBAMOL 500 MG PO TABS: 500.0000 mg | ORAL_TABLET | Freq: Four times a day (QID) | ORAL | 0 refills | Status: DC | PRN

## 2021-03-09 MED ORDER — ENOXAPARIN SODIUM 40 MG/0.4ML IJ SOSY: 40.0000 mg | PREFILLED_SYRINGE | INTRAMUSCULAR | 0 refills | Status: DC

## 2021-03-09 NOTE — Discharge Summary (Signed)
Physician Discharge Summary  Patient ID: Jacqueline Delgado MRN: [id number] DOB/AGE: Mar 09, 1951 70 y.o.  Admit date: 03/06/2021 Discharge date: 03/13/2021 Admission Diagnoses:  S/P TKR (total knee replacement) using cement, left [Z96.652]   Discharge Diagnoses: Patient Active Problem List   Diagnosis Date Noted  . S/P TKR (total knee replacement) using cement, left 03/06/2021  . Nasal congestion 02/21/2021  . OAB (overactive bladder) 01/16/2021  . Uncomplicated opioid dependence ([place]) 10/04/2020  . Chronic low back pain (Bilateral) (R>L) w/o sciatica 09/18/2020  . Spondylosis without myelopathy or radiculopathy, lumbosacral region 09/18/2020  . Lumbosacral facet syndrome (Bilateral) (R>L) 08/23/2020  . Loose stools 08/21/2020  . BMI 50.0-59.9, adult ([place]) 08/21/2020  . Hypokalemia 07/18/2020  . Hypocalcemia 07/18/2020  . Hypoalbuminemia 07/18/2020  . Chronic lower extremity pain (2ry area of Pain) (Bilateral) (R>L) 05/23/2020  . Class 3 obesity with alveolar hypoventilation, serious comorbidity, and body mass index (BMI) of 50.0 to 59.9 in adult ([place]) 05/23/2020  . Elevated C-reactive protein (CRP) 05/23/2020  . Elevated sed rate 05/23/2020  . Calcaneal spur of foot (Right) 05/23/2020  . Lumbar Grade 1 Anterolisthesis of L4/L5 05/23/2020  . Chronic pain syndrome 05/22/2020  . Pharmacologic therapy 05/22/2020  . Disorder of skeletal system 05/22/2020  . Problems influencing health status 05/22/2020  . DDD (degenerative disc disease), lumbosacral 05/22/2020  . Lumbar facet hypertrophy 05/22/2020  . Abnormal MRI, lumbar spine (04/12/2013) 05/22/2020  . Lumbosacral central spinal stenosis (9 mm) (L4-5 and L5-S1) 05/22/2020  . Chronic lower extremity pain (Right) 05/22/2020  . Lumbosacral radiculopathy at L5 (Right) 05/22/2020  . Chronic knee pain (3ry area of Pain) (Left) 05/22/2020  . Swelling of foot (4th area of Pain) (Right) 05/22/2020  . Pneumonia due to COVID-19 virus  04/09/2020  . Ankle swelling, right 04/03/2020  . Hiatal hernia 02/18/2020  . Small vessel vasculitis ([place]) 02/18/2020  . Non-cardiac chest pain 02/18/2020  . Vaginal discharge 10/01/2019  . SOB (shortness of breath) on exertion 09/23/2019  . Anemia 09/23/2019  . B12 deficiency 09/23/2019  . Vitamin D deficiency 09/23/2019  . Hematuria 05/14/2019  . Hot flashes 05/14/2019  . Screening for breast cancer 05/14/2019  . Skin fissures 04/22/2019  . Fatigue 12/09/2018  . Localized swelling of finger of left hand 11/18/2018  . Cough 11/11/2018  . Genital herpes simplex 11/11/2018  . Essential hypertension 11/11/2018  . Plantar fasciitis 05/03/2016  . OSA (obstructive sleep apnea) 04/23/2016  . Acquired trigger finger 11/23/2015  . Depression, major, recurrent, moderate ([place]) 07/06/2015  . Closed nondisplaced fracture of base of fifth metacarpal bone of right hand 06/22/2015  . S/P rotator cuff repair 06/22/2015  . Complete tear of right rotator cuff 05/23/2015  . Osteoarthritis of knee (Left) 02/22/2015  . DDD (degenerative disc disease), cervical 08/03/2014  . DDD (degenerative disc disease), lumbar 08/03/2014  . Leukocytoclastic vasculitis ([place]) 08/03/2014  . Neck pain 08/03/2014  . Cerebral vascular malformation 08/02/2014  . Unspecified osteoarthritis, unspecified site 07/05/2014  . GERD (gastroesophageal reflux disease) 09/20/2013  . Chronic low back pain (1ry area of Pain) (Bilateral) (R>L) w/ sciatica (Right) 09/20/2013  . Osteoarthritis, knee 09/20/2013  . Essential (primary) hypertension 09/20/2013  . Adenomatous colon polyp 03/21/2006    Past Medical History:  Diagnosis Date  . Anemia    H/O  . Anginal pain ([place])     3/8-12/21/10  . Arthritis    Osteoarthritis in BLE knee  . Colon polyp   . GERD (gastroesophageal reflux disease)   . Hiatal hernia   .  History of hiatal hernia   . History of methicillin resistant staphylococcus aureus (MRSA)   . Hypertension     controlled  . Obesity   . Reflux   . Sleep apnea    NO CPAP-SLEEP STUDY FROM 2021 WAS NEGATIVE PER PT  . Small vessel disease ([place])      Transfusion: None   Consultants (if any):   Discharged Condition: Improved  Hospital Course: [name] is an 70 y.o. female who was admitted 03/06/2021 with a diagnosis of left knee osteoarthritis and went to the operating room on 03/06/2021 and underwent the above named procedures.    Surgeries: Procedure(s): TOTAL KNEE ARTHROPLASTY - Rachelle Hora to Assist on 03/06/2021 Patient tolerated the surgery well. Taken to PACU where she was stabilized and then transferred to the orthopedic floor.  Started on Lovenox 30 mg q 12 hrs. Foot pumps applied bilaterally at 80 mm. Heels elevated on bed with rolled towels. No evidence of DVT. Negative [place]. Physical therapy started on day #1 for gait training and transfer. OT started day #1 for ADL and assisted devices.  Patient's foley was d/c on day #1. Patient's IV was d/c on day #2. Patient made slow progress with PT over the next few days. Insurance denied her SNF request. On post op day 7 patient was stable for dc.  On post op day #7 patient was stable and ready for discharge to home with HHPT Implants: Medacta GMK sphere system with 3 leftfemur, 3 lefttibia with short stem and 6mm insert. Size 1patella, all components cemented.   She was given perioperative antibiotics:  Anti-infectives (From admission, onward)   Start     Dose/Rate Route Frequency Ordered Stop   03/06/21 1330  ceFAZolin (ANCEF) IVPB 2g/100 mL premix        2 g 200 mL/hr over 30 Minutes Intravenous Every 6 hours 03/06/21 1147 03/06/21 2024   03/06/21 0659  ceFAZolin (ANCEF) 2-4 GM/100ML-% IVPB       Note to Pharmacy: Lyman Bishop   : cabinet override      03/06/21 0659 03/06/21 0734   03/06/21 0600  ceFAZolin (ANCEF) IVPB 2g/100 mL premix        2 g 200 mL/hr over 30 Minutes Intravenous On call to O.R. 03/06/21 1610  03/06/21 9604    .  She was given sequential compression devices, early ambulation, and Lovenox, teds for DVT prophylaxis.  She benefited maximally from the hospital stay and there were no complications.    Recent vital signs:  Vitals:   03/09/21 0519 03/09/21 0803  BP: 106/70 136/85  Pulse: (!) 109 (!) 102  Resp: 20 18  Temp: 98.4 F (36.9 C) 98.2 F (36.8 C)  SpO2: 96% 93%    Recent laboratory studies:  Lab Results  Component Value Date   HGB 10.2 (L) 03/09/2021   HGB 10.4 (L) 03/08/2021   HGB 11.0 (L) 03/07/2021   Lab Results  Component Value Date   WBC 8.3 03/09/2021   PLT 220 03/09/2021   No results found for: INR Lab Results  Component Value Date   NA 135 03/09/2021   K 3.9 03/09/2021   CL 98 03/09/2021   CO2 30 03/09/2021   BUN 13 03/09/2021   CREATININE 0.95 03/09/2021   GLUCOSE 113 (H) 03/09/2021    Discharge Medications:   Allergies as of 03/09/2021   No Known Allergies     Medication List    STOP taking these medications   traMADol 50  MG tablet Commonly known as: ULTRAM     TAKE these medications   acetaminophen 500 MG tablet Commonly known as: TYLENOL Take 1-2 tablets (500-1,000 mg total) by mouth every 6 (six) hours as needed for mild pain (pain score 1-3 or temp > 100.5).   Aciphex 20 MG tablet Generic drug: RABEprazole Take 1 tablet (20 mg total) by mouth in the morning and at bedtime.   benzonatate 100 MG capsule Commonly known as: Tessalon Perles Take 1 capsule (100 mg total) by mouth 3 (three) times daily as needed for cough.   docusate sodium 100 MG capsule Commonly known as: COLACE Take 1 capsule (100 mg total) by mouth 2 (two) times daily.   enoxaparin 40 MG/0.4ML injection Commonly known as: LOVENOX Inject 0.4 mLs (40 mg total) into the skin daily for 14 days.   famotidine 40 MG tablet Commonly known as: PEPCID Take 40 mg by mouth at bedtime.   fluticasone 50 MCG/ACT nasal spray Commonly known as: FLONASE Place 2  sprays into both nostrils daily as needed for allergies or rhinitis.   hydrochlorothiazide 25 MG tablet Commonly known as: HYDRODIURIL Take 1 tablet (25 mg total) by mouth daily.   losartan 50 MG tablet Commonly known as: COZAAR TAKE 1 TABLET BY MOUTH EVERY DAY What changed: when to take this   methocarbamol 500 MG tablet Commonly known as: ROBAXIN Take 1 tablet (500 mg total) by mouth every 6 (six) hours as needed for muscle spasms.   ondansetron 4 MG disintegrating tablet Commonly known as: Zofran ODT Take 1 tablet (4 mg total) by mouth every 8 (eight) hours as needed for nausea or vomiting.   oxybutynin 10 MG 24 hr tablet Commonly known as: DITROPAN-XL Take 10 mg by mouth at bedtime.   oxyCODONE 5 MG immediate release tablet Commonly known as: Oxy IR/ROXICODONE Take 1-2 tablets (5-10 mg total) by mouth every 4 (four) hours as needed for moderate pain (pain score 4-6).   potassium chloride SA 20 MEQ tablet Commonly known as: KLOR-CON Take 2 tabs (40 MEQ) by mouth for 5 days, then continue to take 1 tab (20 MEQ) by mouth once a day.   torsemide 20 MG tablet Commonly known as: DEMADEX Take 1 tablet (20 mg total) by mouth as needed. for swelling   Trulicity 8.65 HQ/4.6NG Sopn Generic drug: Dulaglutide INJECT 0.75 MG INTO THE SKIN ONCE A WEEK. What changed: additional instructions            Durable Medical Equipment  (From admission, onward)         Start     Ordered   03/06/21 1148  DME Walker rolling  Once       Question Answer Comment  Walker: With [place]   Patient needs a walker to treat with the following condition S/P TKR (total knee replacement) using cement, left      03/06/21 1147   03/06/21 1148  DME 3 n 1  Once        03/06/21 1147   03/06/21 1148  DME Bedside commode  Once       Question:  Patient needs a bedside commode to treat with the following condition  Answer:  S/P TKR (total knee replacement) using cement, left   03/06/21 1147           Diagnostic Studies: DG Knee 1-2 Views Left  Result Date: 03/06/2021 CLINICAL DATA:  Left knee arthroplasty EXAM: LEFT KNEE - 1-2 VIEW COMPARISON:  05/22/2020 FINDINGS:  Interval postsurgical changes of left total knee arthroplasty. Arthroplasty components are in their expected alignment without periprosthetic fracture. Expected postoperative changes within the overlying soft tissues. IMPRESSION: Satisfactory postoperative appearance status post left total knee arthroplasty. Electronically Signed   By: Davina Poke D.O.   On: 03/06/2021 10:19    Disposition:      Contact information for follow-up providers    Duanne Guess, PA-C Follow up in 2 week(s).   Specialties: Orthopedic Surgery, Emergency Medicine Contact information: [place] [address] [phone]            Contact information for after-discharge care    Destination    [facility] SN[facility] Preferred SNF .   Service: Skilled Nursing Contact information: [address] [place] [phone]                   Signed: Dorise Hiss [facility] 03/09/2021, 9:05 AM

## 2021-03-09 NOTE — TOC Progression Note (Addendum)
Transition of Care [facility]) - Progression Note    Patient Details  Name: Jacqueline Delgado MRN: [id number] Date of Birth: [birth year]  [facility] CM/SW Contact  Su Hilt, RN Phone Number: 03/09/2021, 2:05 PM  Clinical Narrative:   Received notification of denial from [facility] to go to rehab Notified the physician and the patient, the patient immediately called the insurance company and filed an appeal         Expected Discharge Plan and Services                                                 Social Determinants of Health (SDOH) Interventions    Readmission Risk Interventions No flowsheet data found.

## 2021-03-09 NOTE — Progress Notes (Signed)
Physical Therapy Treatment Patient Details Name: Jacqueline Delgado MRN: [id number] DOB: 10-Jun-1951 Today's Date: 03/09/2021    History of Present Illness Pt is a 70 yo female s/p L TKA, WBAT. PMH of R TKA, chronic pain syndrome, hiatal hernia, leukocytoclastic vasculitis, R shoulder surgery.    PT Comments    Pt was sitting in recliner upon arriving. She agrees to PT session and is cooperative throughout. Was able to stand and ambulate 120 ft with RW + very slow antalgic step to gait pattern. No LOB or unsteadiness however needs a lot of standing rest due to UE fatigue. Once returned to room, pt was lunch arrived. Will return this afternoon to advance ROM, strength, and overall safe functional mobility. CM informed therapist that she was denied but appealing denial. Would greatly benefit from SNF at DC to address deficits prior to returning home independently.     Follow Up Recommendations  SNF     Equipment Recommendations  None recommended by PT       Precautions / Restrictions Precautions Precautions: Fall Restrictions Weight Bearing Restrictions: Yes LLE Weight Bearing: Weight bearing as tolerated    Mobility  Bed Mobility    General bed mobility comments: Pt was in recliner pre/post session    Transfers Overall transfer level: Needs assistance Equipment used: Rolling walker (2 wheeled) Transfers: Sit to/from Stand Sit to Stand: Min assist     Ambulation/Gait Ambulation/Gait assistance: Min guard Gait Distance (Feet): 120 Feet Assistive device: Rolling walker (2 wheeled) Gait Pattern/deviations: Step-to pattern;Antalgic Gait velocity: decreased   General Gait Details: Pt ambulated 120 ft with RW with very slow,antalgic, step to pattern       Balance Overall balance assessment: Needs assistance Sitting-balance support: Feet supported Sitting balance-Leahy Scale: Good Sitting balance - Comments: no balance deficits in sitting   Standing balance support:  Bilateral upper extremity supported;During functional activity Standing balance-Leahy Scale: Good         Cognition Arousal/Alertness: Awake/alert Behavior During Therapy: WFL for tasks assessed/performed Overall Cognitive Status: Within Functional Limits for tasks assessed    General Comments: Pt is A and O x 4             Pertinent Vitals/Pain Pain Assessment: 0-10 Pain Score: 8  Pain Location: Knee with wt bearing Pain Descriptors / Indicators: Aching;Guarding;Grimacing;Sore Pain Intervention(s): Limited activity within patient's tolerance;Monitored during session;Premedicated before session;Repositioned           PT Goals (current goals can now be found in the care plan section) Acute Rehab PT Goals Patient Stated Goal: rehab then home Progress towards PT goals: Progressing toward goals    Frequency    BID      PT Plan Current plan remains appropriate       AM-PAC PT "6 Clicks" Mobility   Outcome Measure  Help needed turning from your back to your side while in a flat bed without using bedrails?: A Little Help needed moving from lying on your back to sitting on the side of a flat bed without using bedrails?: A Little Help needed moving to and from a bed to a chair (including a wheelchair)?: A Little Help needed standing up from a chair using your arms (e.g., wheelchair or bedside chair)?: A Little Help needed to walk in hospital room?: A Little Help needed climbing 3-5 steps with a railing? : A Little 6 Click Score: 18    End of Session Equipment Utilized During Treatment: Gait belt Activity Tolerance: Patient tolerated treatment well;No  increased pain;Patient limited by fatigue Patient left: in chair;with call bell/phone within reach;with chair alarm set Nurse Communication: Mobility status PT Visit Diagnosis: Other abnormalities of gait and mobility (R26.89);Muscle weakness (generalized) (M62.81);Difficulty in walking, not elsewhere classified  (R26.2);Pain Pain - Right/Left: Left Pain - part of body: Knee     Time: 1140-1200 PT Time Calculation (min) (ACUTE ONLY): 20 min  Charges:  $Gait Training: 8-22 mins                     Julaine Fusi PTA 03/09/21, 2:12 PM

## 2021-03-09 NOTE — Discharge Instructions (Signed)

## 2021-03-09 NOTE — Care Management Important Message (Signed)
Important Message  Patient Details  Name: Jacqueline Delgado MRN: [id number] Date of Birth: Sep 05, 1951   Medicare Important Message Given:  Yes     [name] 03/09/2021, 11:25 AM

## 2021-03-09 NOTE — Progress Notes (Addendum)
PT Cancellation Note  Patient Details Name: ENA DEMARY MRN: [id number] DOB: 1951/01/22   Cancelled Treatment:     PT attempt. PT hold. Pts last BP 84/68 with resting HR > 100 bpm. She does endorse dizziness and has difficulty staying awake during conversation. RN/MD notified. Pt was recently denied rehab by insurance. Therapist discussed DC disposition. Pt is motivated to improve and states she will " I'll just do what I have to do to get home." Recommend stair training and continued reinforcement of positioning/ROM/strengthening during morning session tommorrow.    Willette Pa 03/09/2021, 4:08 PM

## 2021-03-09 NOTE — Progress Notes (Signed)
   Subjective: 3 Days Post-Op Procedure(s) (LRB): TOTAL KNEE ARTHROPLASTY - Rachelle Hora to Assist (Left) Patient reports pain as moderate to severe Patient is well, and has had no acute complaints or problems Denies any CP, SOB, ABD pain. We will continue therapy today.  Plan is to go Skilled nursing facility after hospital stay.  Objective: Vital signs in last 24 hours: Temp:  [98.2 F (36.8 C)-99.1 F (37.3 C)] 98.2 F (36.8 C) (05/27 0803) Pulse Rate:  [102-109] 102 (05/27 0803) Resp:  [16-20] 18 (05/27 0803) BP: (106-139)/(70-89) 136/85 (05/27 0803) SpO2:  [92 %-96 %] 93 % (05/27 0803)  Intake/Output from previous day: 05/26 0701 - 05/27 0700 In: 120 [P.O.:120] Out: 0  Intake/Output this shift: No intake/output data recorded.  Recent Labs    03/06/21 1220 03/07/21 0709 03/08/21 0410 03/09/21 0419  HGB 11.0* 11.0* 10.4* 10.2*   Recent Labs    03/08/21 0410 03/09/21 0419  WBC 8.7 8.3  RBC 4.11 3.96  HCT 32.9* 31.9*  PLT 208 220   Recent Labs    03/08/21 0410 03/09/21 0419  NA 137 135  K 3.4* 3.9  CL 100 98  CO2 28 30  BUN 14 13  CREATININE 0.87 0.95  GLUCOSE 116* 113*  CALCIUM 9.2 9.1   No results for input(s): LABPT, INR in the last 72 hours.  EXAM General - Patient is Alert, Appropriate and Oriented Extremity - Neurovascular intact Sensation intact distally Intact pulses distally Dorsiflexion/Plantar flexion intact No cellulitis present Compartment soft Dressing - dressing C/D/I and no drainage, Prevena intact with 50 cc of serous bloody drainage Motor Function - intact, moving foot and toes well on exam.   Past Medical History:  Diagnosis Date  . Anemia    H/O  . Anginal pain ([place])     3/8-12/21/10  . Arthritis    Osteoarthritis in BLE knee  . Colon polyp   . GERD (gastroesophageal reflux disease)   . Hiatal hernia   . History of hiatal hernia   . History of methicillin resistant staphylococcus aureus (MRSA)   . Hypertension     controlled  . Obesity   . Reflux   . Sleep apnea    NO CPAP-SLEEP STUDY FROM 2021 WAS NEGATIVE PER PT  . Small vessel disease ([place])     Assessment/Plan:   3 Days Post-Op Procedure(s) (LRB): TOTAL KNEE ARTHROPLASTY - Rachelle Hora to Assist (Left) Active Problems:   S/P TKR (total knee replacement) using cement, left  Estimated body mass index is 47.74 kg/m as calculated from the following:   Height as of this encounter: 5\' 2"  (1.575 m).   Weight as of this encounter: 118.4 kg. Advance diet Up with therapy  Work on bowel movement, suppository ordered Vital signs are stable Hypokalemia -resolved Labs stable Pain better controlled. Care manager to assist with discharge to skilled nursing facility COVID test pending.  DVT Prophylaxis - Lovenox, TED hose and SCDs Weight-Bearing as tolerated to left leg   T. Rachelle Hora, PA-C [facility] 03/09/2021, 8:59 AM

## 2021-03-10 NOTE — Progress Notes (Signed)
Patient having hypotension. Iv bolus give last pm and HCTZ cancelled. Has slowed her ability to perform PT.

## 2021-03-10 NOTE — Progress Notes (Signed)
Pt states her iv , saline locked, is bothering her, it is very painful and she wants it removed. RN spoke to her about how long it took to get the iv, that it took multiple people to get an active iv and if she needs any emergency medication or fluids we will not have access. Pt states she knows who can do it and she will deal with it then. IV dc'd tip intact and benjamin smith pa notified via secure chat

## 2021-03-10 NOTE — Progress Notes (Signed)
Physical Therapy Treatment Patient Details Name: Jacqueline Delgado MRN: [id number] DOB: [birth year] Today's Date: 03/10/2021    History of Present Illness Pt is a 70 yo female s/p L TKA, WBAT. PMH of R TKA, chronic pain syndrome, hiatal hernia, leukocytoclastic vasculitis, R shoulder surgery.    PT Comments    Pt much more alert this am and able to engage in PT session. Peceived in recliner, pt able to stand wit RW and CG/SBA with cues for technique.  Slow cadence step to gait 140ft in hallway with CG/SBA.  Pt required reassurance on sequencing and proper swing through phase on Left.  ROM L LE completed upon returning to room and education packet given.  Will see pt again in pm for continued progressive mobility and strengthening.     Follow Up Recommendations  SNF     Equipment Recommendations  None recommended by PT    Recommendations for Other Services       Precautions / Restrictions Precautions Precautions: Fall Restrictions Weight Bearing Restrictions: Yes LLE Weight Bearing: Weight bearing as tolerated    Mobility  Bed Mobility Overal bed mobility: Needs Assistance             General bed mobility comments: Pt was in recliner pre/post session    Transfers Overall transfer level: Needs assistance Equipment used: Rolling walker (2 wheeled) Transfers: Sit to/from Stand Sit to Stand: Min guard         General transfer comment: Able to raise from recliner without physical assist  Ambulation/Gait Ambulation/Gait assistance: Min guard Gait Distance (Feet): 165 Feet Assistive device: Rolling walker (2 wheeled) Gait Pattern/deviations: Step-to pattern;Antalgic Gait velocity: decreased   General Gait Details: very slow cadence with decresed knee flexion and heel strike on Left   Stairs             Wheelchair Mobility    Modified Rankin (Stroke Patients Only)       Balance                                            Cognition  Arousal/Alertness: Awake/alert Behavior During Therapy: WFL for tasks assessed/performed Overall Cognitive Status: Within Functional Limits for tasks assessed                                 General Comments: Pt is A and O x 4      Exercises Total Joint Exercises Ankle Circles/Pumps: AROM;Both;10 reps Quad Sets: AROM;10 reps;Both Heel Slides: AAROM;Left;10 reps    General Comments General comments (skin integrity, edema, etc.):  (Pt issued Total Knee booklet for review and reference with good understanding)      Pertinent Vitals/Pain Pain Assessment: 0-10 Pain Score: 7  Pain Location: Knee with wt bearing Pain Descriptors / Indicators: Aching;Guarding;Grimacing;Sore Pain Intervention(s): Monitored during session    Home Living                      Prior Function            PT Goals (current goals can now be found in the care plan section) Acute Rehab PT Goals Patient Stated Goal: rehab then home Progress towards PT goals: Progressing toward goals    Frequency    BID      PT Plan Current plan remains  appropriate    Co-evaluation              AM-PAC PT "6 Clicks" Mobility   Outcome Measure  Help needed turning from your back to your side while in a flat bed without using bedrails?: A Little Help needed moving from lying on your back to sitting on the side of a flat bed without using bedrails?: A Little Help needed moving to and from a bed to a chair (including a wheelchair)?: A Little Help needed standing up from a chair using your arms (e.g., wheelchair or bedside chair)?: A Little Help needed to walk in hospital room?: A Little Help needed climbing 3-5 steps with a railing? : A Little 6 Click Score: 18    End of Session Equipment Utilized During Treatment: Gait belt Activity Tolerance: Patient tolerated treatment well;No increased pain;Patient limited by fatigue Patient left: in chair;with call bell/phone within reach;with  chair alarm set Nurse Communication: Mobility status PT Visit Diagnosis: Other abnormalities of gait and mobility (R26.89);Muscle weakness (generalized) (M62.81);Difficulty in walking, not elsewhere classified (R26.2);Pain Pain - Right/Left: Left Pain - part of body: Knee     Time: 4917-9150 PT Time Calculation (min) (ACUTE ONLY): 40 min  Charges:  $Gait Training: 23-37 mins $Therapeutic Activity: 8-22 mins                     Mikel Cella, PTA   Jacqueline Delgado 03/10/2021, 1:47 PM

## 2021-03-10 NOTE — Progress Notes (Signed)
Orthostatic bp  Lying 114/63 map 79 pulse 95 Sitting 106/76 map 88 pulse 100 Standing 111/80 map 91 pulse 100

## 2021-03-10 NOTE — Progress Notes (Signed)
Physical Therapy Treatment Patient Details Name: Jacqueline Delgado MRN: [id number] DOB: 05-09-1951 Today's Date: 03/10/2021    History of Present Illness Pt is a 70 yo female s/p L TKA, WBAT. PMH of R TKA, chronic pain syndrome, hiatal hernia, leukocytoclastic vasculitis, R shoulder surgery.    PT Comments    Pt with c/o increased L knee pain and tightness this pm. Pt tolerated gait training again with RW ~150ft with SBA and repeated vc's for left foot placement and heel strike. Increased reliance needed through upper body to offset weight through L LE due to pain.  Pt unable to attempt stairs this afternoon. ModA given to manage B LE's during sit to supine. Once supine, focused on L knee AAROM, knee flexion remains at 85 degrees, TED hose fixed, polar pad adjusted, SCD's placed, rolled towel under L heel. Pt stated she would complete ROM exercises after resting.  Continue PT per POC.      Follow Up Recommendations  SNF     Equipment Recommendations  None recommended by PT    Recommendations for Other Services       Precautions / Restrictions Precautions Precautions: Fall Restrictions Weight Bearing Restrictions: Yes LLE Weight Bearing: Weight bearing as tolerated    Mobility  Bed Mobility Overal bed mobility: Needs Assistance Bed Mobility: Supine to Sit     Supine to sit: Mod assist;HOB elevated Sit to supine: Mod assist (For B LE's)   General bed mobility comments: Pt was in recliner pre/post session    Transfers Overall transfer level: Needs assistance Equipment used: Rolling walker (2 wheeled) Transfers: Sit to/from Stand Sit to Stand: Supervision         General transfer comment: Able to raise from recliner without physical assist  Ambulation/Gait Ambulation/Gait assistance: Supervision;Min guard Gait Distance (Feet): 170 Feet Assistive device: Rolling walker (2 wheeled) Gait Pattern/deviations: Step-to pattern;Antalgic Gait velocity: decreased   General  Gait Details: slow cadence with decresed knee flexion and heel strike on Left   Stairs             Wheelchair Mobility    Modified Rankin (Stroke Patients Only)       Balance                                            Cognition Arousal/Alertness: Awake/alert Behavior During Therapy: WFL for tasks assessed/performed Overall Cognitive Status: Within Functional Limits for tasks assessed                                 General Comments: Pt is A and O x 4      Exercises Total Joint Exercises Ankle Circles/Pumps: AROM;Both;10 reps Quad Sets: AROM;10 reps;Both Heel Slides: AAROM;Left;5 reps Goniometric ROM:  (Remains at 85 degrees flexion)    General Comments General comments (skin integrity, edema, etc.):  (Pt issued Total Knee booklet for review and reference with good understanding)      Pertinent Vitals/Pain Pain Assessment: 0-10 Pain Score: 7  Pain Location: Knee with wt bearing Pain Descriptors / Indicators: Aching;Guarding;Grimacing;Sore Pain Intervention(s): Monitored during session    Home Living                      Prior Function            PT Goals (  current goals can now be found in the care plan section) Acute Rehab PT Goals Patient Stated Goal: rehab then home Progress towards PT goals: Progressing toward goals    Frequency    BID      PT Plan Current plan remains appropriate    Co-evaluation              AM-PAC PT "6 Clicks" Mobility   Outcome Measure  Help needed turning from your back to your side while in a flat bed without using bedrails?: A Little Help needed moving from lying on your back to sitting on the side of a flat bed without using bedrails?: A Little Help needed moving to and from a bed to a chair (including a wheelchair)?: A Little Help needed standing up from a chair using your arms (e.g., wheelchair or bedside chair)?: A Little Help needed to walk in hospital room?: A  Little Help needed climbing 3-5 steps with a railing? : A Little 6 Click Score: 18    End of Session Equipment Utilized During Treatment: Gait belt Activity Tolerance: Patient tolerated treatment well;No increased pain;Patient limited by fatigue Patient left: in bed;with bed alarm set;with family/visitor present;with SCD's reapplied Nurse Communication: Mobility status PT Visit Diagnosis: Other abnormalities of gait and mobility (R26.89);Muscle weakness (generalized) (M62.81);Difficulty in walking, not elsewhere classified (R26.2);Pain Pain - Right/Left: Left Pain - part of body: Knee     Time: 1442-1530 PT Time Calculation (min) (ACUTE ONLY): 48 min  Charges:  $Gait Training: 23-37 mins $Therapeutic Exercise: 8-22 mins $Therapeutic Activity: 8-22 mins                     Mikel Cella, PTA    Jacqueline Delgado 03/10/2021, 4:00 PM

## 2021-03-10 NOTE — Progress Notes (Signed)
  Subjective: 4 Days Post-Op Procedure(s) (LRB): TOTAL KNEE ARTHROPLASTY - Jacqueline Delgado to Assist (Left) Patient seen with Dr Rudene Christians Patient reports pain as moderate.   Patient is well, and has had no acute complaints or problems Plan is to go Skilled nursing facility after hospital stay. Negative for chest pain and shortness of breath Fever: no Gastrointestinal: negative for nausea and vomiting.  Patient has not had a bowel movement.  Objective: Vital signs in last 24 hours: Temp:  [97.3 F (36.3 C)-98.4 F (36.9 C)] 98.4 F (36.9 C) (05/28 0825) Pulse Rate:  [85-99] 92 (05/28 0825) Resp:  [16-20] 20 (05/28 0825) BP: (84-119)/(54-87) 104/54 (05/28 0825) SpO2:  [89 %-100 %] 89 % (05/28 0825)  Intake/Output from previous day:  Intake/Output Summary (Last 24 hours) at 03/10/2021 1208 Last data filed at 03/10/2021 1031 Gross per 24 hour  Intake 1040.48 ml  Output --  Net 1040.48 ml    Intake/Output this shift: Total I/O In: 120 [P.O.:120] Out: -   Labs: Recent Labs    03/08/21 0410 03/09/21 0419  HGB 10.4* 10.2*   Recent Labs    03/08/21 0410 03/09/21 0419  WBC 8.7 8.3  RBC 4.11 3.96  HCT 32.9* 31.9*  PLT 208 220   Recent Labs    03/08/21 0410 03/09/21 0419  NA 137 135  K 3.4* 3.9  CL 100 98  CO2 28 30  BUN 14 13  CREATININE 0.87 0.95  GLUCOSE 116* 113*  CALCIUM 9.2 9.1   No results for input(s): LABPT, INR in the last 72 hours.   EXAM General - Patient is Alert, Appropriate and Oriented Extremity - Neurovascular intact Dorsiflexion/Plantar flexion intact Compartment soft Dressing/Incision -Preveena in place and working, ~75 mL serosanguinous drainage noted  Motor Function - intact, moving foot and toes well on exam. Difficulty performing SLR Cardiovascular- Regular rate and rhythm, no murmurs/rubs/gallops Respiratory- Lungs clear to auscultation bilaterally Gastrointestinal- soft, nontender and active bowel sounds   Assessment/Plan: 4 Days  Post-Op Procedure(s) (LRB): TOTAL KNEE ARTHROPLASTY - Jacqueline Delgado to Assist (Left) Active Problems:   S/P TKR (total knee replacement) using cement, left  Estimated body mass index is 47.74 kg/m as calculated from the following:   Height as of this encounter: 5\' 2"  (1.575 m).   Weight as of this encounter: 118.4 kg. Advance diet Up with therapy  Plan is for likely d/c to SNF, possibly Monday or Tuesday.  Fresh honeycomb dressing applied.   DVT Prophylaxis - Lovenox, Ted hose and SCDs Weight-Bearing as tolerated to left leg  Cassell Smiles, PA-C [facility] 03/10/2021, 12:08 PM

## 2021-03-11 MED ORDER — NYSTATIN 100000 UNIT/ML MT SUSP
5.0000 mL | Freq: Four times a day (QID) | OROMUCOSAL | Status: DC
  Administered 2021-03-11 – 2021-03-13 (×7): 500000 [IU] via OROMUCOSAL
  Filled 2021-03-11 (×7): qty 5

## 2021-03-11 MED ORDER — DM-GUAIFENESIN ER 30-600 MG PO TB12
1.0000 | ORAL_TABLET | Freq: Two times a day (BID) | ORAL | Status: DC
  Administered 2021-03-11 – 2021-03-13 (×5): 1 via ORAL
  Filled 2021-03-11 (×7): qty 1

## 2021-03-11 NOTE — Progress Notes (Signed)
  Subjective: 5 Days Post-Op Procedure(s) (LRB): TOTAL KNEE ARTHROPLASTY - Jacqueline Delgado to Assist (Left) Patient reports pain as moderate.   Patient is well, but has had some minor complaints of URI symptoms and sore throat Plan is to go Skilled nursing facility after hospital stay. Negative for chest pain and shortness of breath Fever: no Gastrointestinal: negative for nausea and vomiting.  Patient has not had a bowel movement.  Objective: Vital signs in last 24 hours: Temp:  [97.9 F (36.6 C)-98.6 F (37 C)] 98.6 F (37 C) (05/29 0558) Pulse Rate:  [89-100] 91 (05/29 0558) Resp:  [16-20] 16 (05/29 0558) BP: (106-122)/(63-82) 122/79 (05/29 0558) SpO2:  [91 %-99 %] 99 % (05/29 0558)  Intake/Output from previous day:  Intake/Output Summary (Last 24 hours) at 03/11/2021 1011 Last data filed at 03/10/2021 1358 Gross per 24 hour  Intake 340 ml  Output --  Net 340 ml    Intake/Output this shift: No intake/output data recorded.  Labs: Recent Labs    03/09/21 0419  HGB 10.2*   Recent Labs    03/09/21 0419  WBC 8.3  RBC 3.96  HCT 31.9*  PLT 220   Recent Labs    03/09/21 0419  NA 135  K 3.9  CL 98  CO2 30  BUN 13  CREATININE 0.95  GLUCOSE 113*  CALCIUM 9.1   No results for input(s): LABPT, INR in the last 72 hours.   EXAM General - Patient is Alert, Appropriate and Oriented Extremity - Neurovascular intact Dorsiflexion/Plantar flexion intact Compartment soft Dressing/Incision -Prevena in place and working, 75 mL in cannister, no change from previous  Motor Function - intact, moving foot and toes well on exam.    Cardiovascular- Regular rate and rhythm, no murmurs/rubs/gallops Respiratory- Lungs clear to auscultation bilaterally Gastrointestinal- soft, nontender and active bowel sounds   Assessment/Plan: 5 Days Post-Op Procedure(s) (LRB): TOTAL KNEE ARTHROPLASTY - Jacqueline Delgado to Assist (Left) Active Problems:   S/P TKR (total knee replacement) using  cement, left  Estimated body mass index is 47.74 kg/m as calculated from the following:   Height as of this encounter: 5\' 2"  (1.575 m).   Weight as of this encounter: 118.4 kg. Advance diet Up with therapy  Plan for d/c to SNF when bed is offered.  Work on [employer]  -will give miralax today and progress as necessary    URI symptoms Order placed for Mucinex DM  May give Chloraseptic spray as needed  DVT Prophylaxis - Lovenox, Ted hose and foot pumps Weight-Bearing as tolerated to left leg  Cassell Smiles, PA-C [facility] 03/11/2021, 10:11 AM

## 2021-03-11 NOTE — Progress Notes (Signed)
Physical Therapy Treatment Patient Details Name: Jacqueline Delgado MRN: [id number] DOB: 05-15-1951 Today's Date: 03/11/2021    History of Present Illness Pt is a 70 yo female s/p L TKA, WBAT. PMH of R TKA, chronic pain syndrome, hiatal hernia, leukocytoclastic vasculitis, R shoulder surgery.    PT Comments    Pt agrees to session after pain meds.  Participated in exercises as described below.  She resists and refuses attempts at stretching/ROM L knee and self limits flexion during sit to stand transfer.  Stands and is able to walk complete lap today.  She does keep L knee externally rotated despite cues to point toes straight during gait and often clips toes with walker leg.  Bariatric walker is obtained and tried with some improvement but given thigh girth she continues.  She does prefer bariatric walker and it is left in her room for use.  Returns to bed with mod a x 1 and positioned for comfort.   Follow Up Recommendations  SNF     Equipment Recommendations  None recommended by PT    Recommendations for Other Services       Precautions / Restrictions Precautions Precautions: Fall Restrictions Weight Bearing Restrictions: Yes LLE Weight Bearing: Weight bearing as tolerated    Mobility  Bed Mobility Overal bed mobility: Needs Assistance Bed Mobility: Supine to Sit;Sit to Supine     Supine to sit: Supervision Sit to supine: Min assist        Transfers Overall transfer level: Needs assistance Equipment used: Rolling walker (2 wheeled) Transfers: Sit to/from Stand Sit to Stand: Supervision         General transfer comment: Able to raise from bed without physical assist  Ambulation/Gait Ambulation/Gait assistance: Supervision;Min guard Gait Distance (Feet): 200 Feet Assistive device: Rolling walker (2 wheeled) Gait Pattern/deviations: Step-to pattern;Step-through pattern;Wide base of support Gait velocity: decreased   General Gait Details: slow cadence with  decresed knee flexion and heel strike on Left   Stairs             Wheelchair Mobility    Modified Rankin (Stroke Patients Only)       Balance Overall balance assessment: Needs assistance Sitting-balance support: Feet supported Sitting balance-Leahy Scale: Good Sitting balance - Comments: no balance deficits in sitting   Standing balance support: Bilateral upper extremity supported;During functional activity Standing balance-Leahy Scale: Good                              Cognition Arousal/Alertness: Awake/alert Behavior During Therapy: WFL for tasks assessed/performed Overall Cognitive Status: Within Functional Limits for tasks assessed                                 General Comments: self directs therapy session      Exercises Total Joint Exercises Ankle Circles/Pumps: AROM;Both;10 reps Quad Sets: AROM;10 reps;Both Heel Slides: AAROM;Left;10 reps Hip ABduction/ADduction: AAROM;Left;10 reps Straight Leg Raises: Left;5 reps;AAROM Goniometric ROM: 0-70  - does not allow stretching today despite education    General Comments        Pertinent Vitals/Pain Pain Assessment: 0-10 Pain Score: 7  Pain Location: Knee with wt bearing Pain Descriptors / Indicators: Aching;Guarding;Grimacing;Sore Pain Intervention(s): Limited activity within patient's tolerance;Monitored during session;Patient requesting pain meds-RN notified;RN gave pain meds during session    Home Living  Prior Function            PT Goals (current goals can now be found in the care plan section) Progress towards PT goals: Progressing toward goals    Frequency    BID      PT Plan Current plan remains appropriate    Co-evaluation              AM-PAC PT "6 Clicks" Mobility   Outcome Measure  Help needed turning from your back to your side while in a flat bed without using bedrails?: A Little Help needed moving from lying  on your back to sitting on the side of a flat bed without using bedrails?: A Little Help needed moving to and from a bed to a chair (including a wheelchair)?: A Little Help needed standing up from a chair using your arms (e.g., wheelchair or bedside chair)?: A Little Help needed to walk in hospital room?: A Little Help needed climbing 3-5 steps with a railing? : A Little 6 Click Score: 18    End of Session Equipment Utilized During Treatment: Gait belt Activity Tolerance: Patient tolerated treatment well;No increased pain;Patient limited by fatigue Patient left: in bed;with bed alarm set;with family/visitor present;with SCD's reapplied Nurse Communication: Mobility status PT Visit Diagnosis: Other abnormalities of gait and mobility (R26.89);Muscle weakness (generalized) (M62.81);Difficulty in walking, not elsewhere classified (R26.2);Pain Pain - Right/Left: Left Pain - part of body: Knee     Time: 1157-1222 PT Time Calculation (min) (ACUTE ONLY): 25 min  Charges:  $Gait Training: 8-22 mins $Therapeutic Exercise: 8-22 mins                    Chesley Noon, PTA 03/11/21, 1:33 PM

## 2021-03-12 NOTE — Progress Notes (Signed)
Pt report she does not want to go to [facility] for rehab and wishes to try for [facility]. Pt report wanting to speak with Education officer, museum. Pass on information to oncoming nurse. Pt throw up after drinking magnesium citrate this am. Pt LBM was 5/24. Pt was given oral zofran because she has no IV access. Will continue to monitor.

## 2021-03-12 NOTE — Progress Notes (Signed)
Physical Therapy Treatment Patient Details Name: Jacqueline Delgado MRN: [id number] DOB: [birth year] Today's Date: 03/12/2021    History of Present Illness Pt is a 70 yo female s/p L TKA, WBAT. PMH of R TKA, chronic pain syndrome, hiatal hernia, leukocytoclastic vasculitis, R shoulder surgery.    PT Comments    Patient is progressing slowly. She continues to be limited in LLE Knee ROM and is self limiting with exercise due to pain at end range. She had a difficult time achieving quad activation with SAQ/SLR exercise this session. As a result she requires min A with bed mobility. Patient instructed in gait training requiring cues for advanced LLE forward and shifting weight to LLE. She does report increased fatigue at end of session. She would benefit from additional skilled PT Intervention to improve strength, balance and mobility;    Follow Up Recommendations  SNF     Equipment Recommendations  None recommended by PT    Recommendations for Other Services       Precautions / Restrictions Precautions Precautions: Fall Restrictions Weight Bearing Restrictions: Yes LLE Weight Bearing: Weight bearing as tolerated    Mobility  Bed Mobility Overal bed mobility: Needs Assistance Bed Mobility: Supine to Sit;Sit to Supine     Supine to sit: Min assist Sit to supine: Min assist   General bed mobility comments: Requires assistance to lift LLE in/out of bed; limited with hip and knee flexion;    Transfers Overall transfer level: Needs assistance Equipment used: Rolling walker (2 wheeled) Transfers: Sit to/from Stand Sit to Stand: Min guard         General transfer comment: with cues for foot positioning and placement;  Ambulation/Gait Ambulation/Gait assistance: Supervision;Min guard Gait Distance (Feet): 200 Feet Assistive device: Rolling walker (2 wheeled) Gait Pattern/deviations: Step-to pattern;Step-through pattern;Wide base of support Gait velocity: decreased   General  Gait Details: slow cadence with decresed knee flexion and heel strike on Left   Stairs             Wheelchair Mobility    Modified Rankin (Stroke Patients Only)       Balance Overall balance assessment: Needs assistance Sitting-balance support: Feet supported Sitting balance-Leahy Scale: Good Sitting balance - Comments: no balance deficits in sitting   Standing balance support: Bilateral upper extremity supported;During functional activity Standing balance-Leahy Scale: Good Standing balance comment: does rely on UE support during standing                            Cognition Arousal/Alertness: Awake/alert Behavior During Therapy: WFL for tasks assessed/performed Overall Cognitive Status: Within Functional Limits for tasks assessed                                 General Comments: self directs therapy session      Exercises Total Joint Exercises Ankle Circles/Pumps: 15 reps;Both;AROM Short Arc Quad: AAROM;Left;15 reps Heel Slides: AAROM;Left;15 reps Straight Leg Raises: AAROM;Left;10 reps Goniometric ROM: 20-65 in supine; has difficulty achieving quad activation for knee extension and limited in knee flexion due to pain; ROM taken after exercise; Other Exercises Other Exercises: Patient required min VCS to increase ROM with all exercise. She required AAROM on LLE to facilitate better flexiblity and ROM;    General Comments        Pertinent Vitals/Pain Pain Assessment: 0-10 Pain Score: 7  Pain Location: knee with ROM/weight bearing activity;  Pain Descriptors / Indicators: Aching;Guarding;Grimacing Pain Intervention(s): Limited activity within patient's tolerance;Repositioned;Monitored during session    Home Living                      Prior Function            PT Goals (current goals can now be found in the care plan section) Acute Rehab PT Goals Patient Stated Goal: rehab then home Progress towards PT goals:  Progressing toward goals    Frequency    BID      PT Plan Current plan remains appropriate    Co-evaluation              AM-PAC PT "6 Clicks" Mobility   Outcome Measure  Help needed turning from your back to your side while in a flat bed without using bedrails?: A Little Help needed moving from lying on your back to sitting on the side of a flat bed without using bedrails?: A Little Help needed moving to and from a bed to a chair (including a wheelchair)?: A Little Help needed standing up from a chair using your arms (e.g., wheelchair or bedside chair)?: A Little Help needed to walk in hospital room?: A Little Help needed climbing 3-5 steps with a railing? : A Little 6 Click Score: 18    End of Session Equipment Utilized During Treatment: Gait belt Activity Tolerance: Patient tolerated treatment well;Patient limited by fatigue;Patient limited by pain Patient left: in bed;with bed alarm set;with family/visitor present;with SCD's reapplied Nurse Communication: Mobility status PT Visit Diagnosis: Other abnormalities of gait and mobility (R26.89);Muscle weakness (generalized) (M62.81);Difficulty in walking, not elsewhere classified (R26.2);Pain Pain - Right/Left: Left Pain - part of body: Knee     Time: 1540-0867 PT Time Calculation (min) (ACUTE ONLY): 37 min  Charges:  $Gait Training: 8-22 mins $Therapeutic Exercise: 8-22 mins                        Tekelia Kareem PT, DPT 03/12/2021, 3:44 PM

## 2021-03-12 NOTE — Progress Notes (Signed)
PT Cancellation Note  Patient Details Name: Jacqueline Delgado MRN: [id number] DOB: Feb 19, 1951   Cancelled Treatment:    Reason Eval/Treat Not Completed: Other (comment);Patient declined, no reason specified  PT attempted treatment at 10:17, patient refused, stating, "I need to get my suppository. That is most important right now. Just come back another time."   Will re-attempt PT treatment when patient is agreeable. Thank you for this referral.  Correll Denbow PT, DPT 03/12/2021, 11:43 AM

## 2021-03-12 NOTE — Care Management Important Message (Signed)
Important Message  Patient Details  Name: Jacqueline Delgado MRN: [id number] Date of Birth: 14-May-1951   Medicare Important Message Given:  Yes     [name] 03/12/2021, 12:30 PM

## 2021-03-12 NOTE — Progress Notes (Addendum)
  Subjective: 6 Days Post-Op Procedure(s) (LRB): TOTAL KNEE ARTHROPLASTY - Rachelle Hora to Assist (Left) Patient reports pain as moderate.   Patient reports some mild relief of her sore throat since yesterday with medication.  Plan is to go Skilled nursing facility after hospital stay. Negative for chest pain and shortness of breath Fever: no Gastrointestinal: positive for nausea and vomiting. Patient vomited after receiving mag citrate yesterday.  Patient has not had a bowel movement.  Objective: Vital signs in last 24 hours: Temp:  [98.5 F (36.9 C)-99.1 F (37.3 C)] 98.8 F (37.1 C) (05/30 0734) Pulse Rate:  [89-92] 91 (05/30 0734) Resp:  [16-18] 16 (05/30 0734) BP: (109-128)/(63-77) 115/63 (05/30 0734) SpO2:  [93 %-100 %] 93 % (05/30 0734) FiO2 (%):  [0 %] 0 % (05/29 2036)  Intake/Output from previous day: No intake or output data in the 24 hours ending 03/12/21 1038  Intake/Output this shift: No intake/output data recorded.  Labs: No results for input(s): HGB in the last 72 hours. No results for input(s): WBC, RBC, HCT, PLT in the last 72 hours. No results for input(s): NA, K, CL, CO2, BUN, CREATININE, GLUCOSE, CALCIUM in the last 72 hours. No results for input(s): LABPT, INR in the last 72 hours.   EXAM General - Patient is Alert, Appropriate and Oriented Extremity - Neurovascular intact Dorsiflexion/Plantar flexion intact Compartment soft Dressing/Incision -Prevena in place but not working, readjusted tubing and suction resumed, no break in seal noted  Motor Function - intact, moving foot and toes well on exam.  Cardiovascular- tachycardic rate, regular rhythm  Respiratory- Lungs clear to auscultation bilaterally Gastrointestinal- soft, nontender and active bowel sounds   Assessment/Plan: 6 Days Post-Op Procedure(s) (LRB): TOTAL KNEE ARTHROPLASTY - Rachelle Hora to Assist (Left) Active Problems:   S/P TKR (total knee replacement) using cement,  left  Estimated body mass index is 47.74 kg/m as calculated from the following:   Height as of this encounter: 5\' 2"  (1.575 m).   Weight as of this encounter: 118.4 kg. Advance diet Up with therapy  Stressed importance of working to increase ROM of the knee, with ambulation being of secondary importance at this point in her recovery.  Spoke with TOC, they will work on Customer service manager for SNF for patient.   Work on [employer] -spoke with nursing, will give suppository this AM     DVT Prophylaxis - Lovenox, Ted hose and foot pumps Weight-Bearing as tolerated to left leg  Cassell Smiles, PA-C [facility] 03/12/2021, 10:38 AM

## 2021-03-13 LAB — CREATININE, SERUM
Creatinine, Ser: 0.86 mg/dL (ref 0.44–1.00)
GFR, Estimated: >60 mL/min (ref >60)

## 2021-03-13 NOTE — Progress Notes (Signed)
Discharge instructions reviewed with pt. She verbalized understanding of instructions. Pt does not want to take lovenox at home. Dr Rudene Christians ordered aspirin BID.

## 2021-03-13 NOTE — Progress Notes (Signed)
Physical Therapy Treatment Patient Details Name: Jacqueline Delgado MRN: [id number] DOB: 06-05-1951 Today's Date: 03/13/2021    History of Present Illness Pt is a 70 yo female s/p L TKA, WBAT. PMH of R TKA, chronic pain syndrome, hiatal hernia, leukocytoclastic vasculitis, R shoulder surgery.    PT Comments    Pt was long sitting in bed upon arriving. She is frustrated with situation however was cooperative and motivated during PT session. Pt was denied rehab by insurance however pt is understanding that she will need to perform HHPT. She was able to exit bed with gait belt assist progressing LE to EOB/floor. She stood to RW and ambulated > 200 ft total without LOB or safety concern. Was able to perform stairs with backwards technique without rails. Returned to room and discussed HHPT. Pt is safe from PT standpoint for safe DC home with HHPT to follow. Pt awaiting bariatric RW/ BSC prior to DC.    Follow Up Recommendations  Home health PT     Equipment Recommendations  None recommended by PT       Precautions / Restrictions Precautions Precautions: Fall Restrictions Weight Bearing Restrictions: Yes LLE Weight Bearing: Weight bearing as tolerated    Mobility  Bed Mobility Overal bed mobility: Needs Assistance Bed Mobility: Supine to Sit;Sit to Supine     Supine to sit: Supervision     General bed mobility comments: Issued gait belt for use for assisting LE off EOB. No physical assistance required to get out of bed    Transfers Overall transfer level: Needs assistance Equipment used: Rolling walker (2 wheeled) Transfers: Sit to/from Stand Sit to Stand: Supervision         General transfer comment: pt stood from EOB and from recliner with supervision only  Ambulation/Gait Ambulation/Gait assistance: Supervision Gait Distance (Feet): 200 Feet Assistive device: Rolling walker (2 wheeled) (Bariatric) Gait Pattern/deviations: Step-to pattern;Step-through  pattern;Antalgic Gait velocity: decreased   General Gait Details: started with step to pattern but able to advance to step through pattern   Stairs Stairs: Yes Stairs assistance: Supervision Stair Management: No rails;Backwards;With walker Number of Stairs: 4 General stair comments: pt demonstarted safe abilityt o perform ascending/descending 4 stair       Balance Overall balance assessment: Needs assistance Sitting-balance support: Feet supported Sitting balance-Leahy Scale: Good Sitting balance - Comments: no balance deficits in sitting   Standing balance support: Bilateral upper extremity supported;During functional activity Standing balance-Leahy Scale: Good       Cognition Arousal/Alertness: Awake/alert Behavior During Therapy: WFL for tasks assessed/performed Overall Cognitive Status: Within Functional Limits for tasks assessed                                               Pertinent Vitals/Pain Pain Assessment: 0-10 Pain Score: 4  Pain Location: knee with ROM/weight bearing activity; Pain Descriptors / Indicators: Aching;Guarding;Grimacing Pain Intervention(s): Limited activity within patient's tolerance;Monitored during session;Premedicated before session;Repositioned;Ice applied           PT Goals (current goals can now be found in the care plan section) Acute Rehab PT Goals Patient Stated Goal: rehab then home Progress towards PT goals: Progressing toward goals    Frequency    BID      PT Plan Discharge plan needs to be updated       AM-PAC PT "6 Clicks" Mobility   Outcome Measure  Help needed turning from your back to your side while in a flat bed without using bedrails?: A Little Help needed moving from lying on your back to sitting on the side of a flat bed without using bedrails?: A Little Help needed moving to and from a bed to a chair (including a wheelchair)?: A Little Help needed standing up from a chair using your  arms (e.g., wheelchair or bedside chair)?: A Little Help needed to walk in hospital room?: A Little Help needed climbing 3-5 steps with a railing? : A Little 6 Click Score: 18    End of Session Equipment Utilized During Treatment: Gait belt Activity Tolerance: Patient tolerated treatment well Patient left: in chair;with call bell/phone within reach;with chair alarm set Nurse Communication: Mobility status PT Visit Diagnosis: Other abnormalities of gait and mobility (R26.89);Muscle weakness (generalized) (M62.81);Difficulty in walking, not elsewhere classified (R26.2);Pain Pain - Right/Left: Left Pain - part of body: Knee     Time: 7903-8333 PT Time Calculation (min) (ACUTE ONLY): 39 min  Charges:  $Gait Training: 23-37 mins $Therapeutic Activity: 8-22 mins                     Julaine Fusi PTA 03/13/21, 12:48 PM

## 2021-03-13 NOTE — Progress Notes (Signed)
   Subjective: 7 Days Post-Op Procedure(s) (LRB): TOTAL KNEE ARTHROPLASTY - Rachelle Hora to Assist (Left) Patient reports pain as mild.   Patient is well, and has had no acute complaints or problems Denies any CP, SOB, ABD pain. We will continue therapy today.  Plan is to go Home after hospital stay.  Objective: Vital signs in last 24 hours: Temp:  [98.1 F (36.7 C)-99.3 F (37.4 C)] 98.4 F (36.9 C) (05/31 0828) Pulse Rate:  [79-108] 79 (05/31 0828) Resp:  [14-18] 14 (05/31 0828) BP: (96-127)/(63-84) 127/63 (05/31 0828) SpO2:  [91 %-96 %] 91 % (05/31 0828)  Intake/Output from previous day: 05/30 0701 - 05/31 0700 In: 720 [P.O.:720] Out: -  Intake/Output this shift: No intake/output data recorded.  No results for input(s): HGB in the last 72 hours. No results for input(s): WBC, RBC, HCT, PLT in the last 72 hours. Recent Labs    03/13/21 0434  CREATININE 0.86   No results for input(s): LABPT, INR in the last 72 hours.  EXAM General - Patient is Alert, Appropriate and Oriented Extremity - Neurovascular intact Sensation intact distally Intact pulses distally Dorsiflexion/Plantar flexion intact No cellulitis present Compartment soft Dressing - dressing C/D/I and no drainage, Prevena intact  Motor Function - intact, moving foot and toes well on exam.   Past Medical History:  Diagnosis Date  . Anemia    H/O  . Anginal pain ([place])     3/8-12/21/10  . Arthritis    Osteoarthritis in BLE knee  . Colon polyp   . GERD (gastroesophageal reflux disease)   . Hiatal hernia   . History of hiatal hernia   . History of methicillin resistant staphylococcus aureus (MRSA)   . Hypertension    controlled  . Obesity   . Reflux   . Sleep apnea    NO CPAP-SLEEP STUDY FROM 2021 WAS NEGATIVE PER PT  . Small vessel disease ([place])     Assessment/Plan:   7 Days Post-Op Procedure(s) (LRB): TOTAL KNEE ARTHROPLASTY - Rachelle Hora to Assist (Left) Active Problems:   S/P TKR (total  knee replacement) using cement, left  Estimated body mass index is 47.74 kg/m as calculated from the following:   Height as of this encounter: 5\' 2"  (1.575 m).   Weight as of this encounter: 118.4 kg. Advance diet Up with therapy  Vital signs are stable Pain well controlled Care manager to assist with discharge to home with HHPT today  DVT Prophylaxis - Lovenox, TED hose and SCDs Weight-Bearing as tolerated to left leg   T. Rachelle Hora, PA-C [facility] 03/13/2021, 9:46 AM

## 2021-03-13 NOTE — Progress Notes (Signed)
   03/13/21 0934  Clinical Encounter Type  Visited With Patient  Visit Type Initial  Referral From Chaplain  Consult/Referral To [place] visited room 154, Pt Mrs. [name]. Pt has advanced left knee osteoarthritis. She has a history of prior right total knee arthroplasty, performed approximately 10 years ago. Patient states her left knee pain started prior to Thanksgiving in 08/2020. She reported left knee gave out on her, and she was in severe pain. At that time, she had to ambulate with a walker and has not been able to ambulate without an assistive device since. Pt stated she has been trying to speak to her social worker so she can set up her Rehab. She stated,she was denied at one facility because her insurance was not in the network. She is waiting to speak to her social worker about other options as to facilities in her network.I provided reflective listening and emotional support. I also conveyed this information to one of the social workers who stated, Mrs. Nierenberg social worker will definitely following up with her soon about the matter.

## 2021-03-13 NOTE — TOC Progression Note (Signed)
Transition of Care [facility]) - Progression Note    Patient Details  Name: Jacqueline Delgado MRN: [id number] Date of Birth: 1951-05-07  Transition of Care [facility]) CM/SW [place], RN Phone Number: 03/13/2021, 10:45 AM  Clinical Narrative:     Met with the patient to discuss DC plan, She was denied by her insurance company to go to [facility] for STR, the patient stated that she was told that it was because [facility] is Out of Network with [facility] Medicare, I called while in the patient's room and verified that [facility] is in fact INN with [facility] Medicare, The patient demanded that she go to [facility], her grand daughter went to [facility] and was told by someone with the name Marvetta Gibbons that they have 6 beds and the patient can Have 1, I called [facility] and spoke with Seth Bake while in the patient's room and verified that they do not have beds available that the beds that they have are for residents that may need STR, The patient stated that she wants to go to a SNF, I explained that her insurance denied the SNF, I called the Insurance company while in the patient's room and the confirmed that the reason the Auth was denied and her appeal was denied due to her  High function and because she was able to walk 200 ft, she stated that she has to go to SNF because she has no help at home at all, I explained to her that in order to go to rehab she will have to pay out of pocket, she stated she cant do that and wants HH, all entities of HH, I notified [facility] of the patient's request. She needs a bariatric RW and 3 in 1 I notified Rhonda at [facility] it will be brought into the room prior to DC, her grand daughter is here to provide transportation       Expected Discharge Plan and Services           Expected Discharge Date: 03/13/21                                     Social Determinants of Health (SDOH) Interventions    Readmission Risk Interventions No flowsheet data found.

## 2021-03-14 DIAGNOSIS — K219 Gastro-esophageal reflux disease without esophagitis: Secondary | ICD-10-CM | POA: Diagnosis not present

## 2021-03-14 DIAGNOSIS — I10 Essential (primary) hypertension: Secondary | ICD-10-CM | POA: Diagnosis not present

## 2021-03-14 DIAGNOSIS — G894 Chronic pain syndrome: Secondary | ICD-10-CM | POA: Diagnosis not present

## 2021-03-14 DIAGNOSIS — D649 Anemia, unspecified: Secondary | ICD-10-CM | POA: Diagnosis not present

## 2021-03-14 DIAGNOSIS — Z471 Aftercare following joint replacement surgery: Secondary | ICD-10-CM | POA: Diagnosis not present

## 2021-03-14 DIAGNOSIS — Z7901 Long term (current) use of anticoagulants: Secondary | ICD-10-CM | POA: Diagnosis not present

## 2021-03-14 DIAGNOSIS — G4730 Sleep apnea, unspecified: Secondary | ICD-10-CM | POA: Diagnosis not present

## 2021-03-14 DIAGNOSIS — Z96653 Presence of artificial knee joint, bilateral: Secondary | ICD-10-CM | POA: Diagnosis not present

## 2021-03-14 DIAGNOSIS — F32A Depression, unspecified: Secondary | ICD-10-CM | POA: Diagnosis not present

## 2021-03-14 DIAGNOSIS — Z8616 Personal history of COVID-19: Secondary | ICD-10-CM | POA: Diagnosis not present

## 2021-03-16 DIAGNOSIS — Z471 Aftercare following joint replacement surgery: Secondary | ICD-10-CM | POA: Diagnosis not present

## 2021-03-16 DIAGNOSIS — Z7901 Long term (current) use of anticoagulants: Secondary | ICD-10-CM | POA: Diagnosis not present

## 2021-03-16 DIAGNOSIS — I10 Essential (primary) hypertension: Secondary | ICD-10-CM | POA: Diagnosis not present

## 2021-03-16 DIAGNOSIS — Z8616 Personal history of COVID-19: Secondary | ICD-10-CM | POA: Diagnosis not present

## 2021-03-16 DIAGNOSIS — F32A Depression, unspecified: Secondary | ICD-10-CM | POA: Diagnosis not present

## 2021-03-16 DIAGNOSIS — D649 Anemia, unspecified: Secondary | ICD-10-CM | POA: Diagnosis not present

## 2021-03-16 DIAGNOSIS — G4730 Sleep apnea, unspecified: Secondary | ICD-10-CM | POA: Diagnosis not present

## 2021-03-16 DIAGNOSIS — G894 Chronic pain syndrome: Secondary | ICD-10-CM | POA: Diagnosis not present

## 2021-03-16 DIAGNOSIS — K219 Gastro-esophageal reflux disease without esophagitis: Secondary | ICD-10-CM | POA: Diagnosis not present

## 2021-03-16 DIAGNOSIS — Z96653 Presence of artificial knee joint, bilateral: Secondary | ICD-10-CM | POA: Diagnosis not present

## 2021-03-17 DIAGNOSIS — Z471 Aftercare following joint replacement surgery: Secondary | ICD-10-CM | POA: Diagnosis not present

## 2021-03-17 DIAGNOSIS — K219 Gastro-esophageal reflux disease without esophagitis: Secondary | ICD-10-CM | POA: Diagnosis not present

## 2021-03-17 DIAGNOSIS — I10 Essential (primary) hypertension: Secondary | ICD-10-CM | POA: Diagnosis not present

## 2021-03-17 DIAGNOSIS — F32A Depression, unspecified: Secondary | ICD-10-CM | POA: Diagnosis not present

## 2021-03-17 DIAGNOSIS — D649 Anemia, unspecified: Secondary | ICD-10-CM | POA: Diagnosis not present

## 2021-03-17 DIAGNOSIS — G4730 Sleep apnea, unspecified: Secondary | ICD-10-CM | POA: Diagnosis not present

## 2021-03-17 DIAGNOSIS — G894 Chronic pain syndrome: Secondary | ICD-10-CM | POA: Diagnosis not present

## 2021-03-17 DIAGNOSIS — Z7901 Long term (current) use of anticoagulants: Secondary | ICD-10-CM | POA: Diagnosis not present

## 2021-03-17 DIAGNOSIS — Z8616 Personal history of COVID-19: Secondary | ICD-10-CM | POA: Diagnosis not present

## 2021-03-17 DIAGNOSIS — Z96653 Presence of artificial knee joint, bilateral: Secondary | ICD-10-CM | POA: Diagnosis not present

## 2021-03-19 DIAGNOSIS — G894 Chronic pain syndrome: Secondary | ICD-10-CM | POA: Diagnosis not present

## 2021-03-19 DIAGNOSIS — Z8616 Personal history of COVID-19: Secondary | ICD-10-CM | POA: Diagnosis not present

## 2021-03-19 DIAGNOSIS — Z471 Aftercare following joint replacement surgery: Secondary | ICD-10-CM | POA: Diagnosis not present

## 2021-03-19 DIAGNOSIS — Z7901 Long term (current) use of anticoagulants: Secondary | ICD-10-CM | POA: Diagnosis not present

## 2021-03-19 DIAGNOSIS — G4730 Sleep apnea, unspecified: Secondary | ICD-10-CM | POA: Diagnosis not present

## 2021-03-19 DIAGNOSIS — D649 Anemia, unspecified: Secondary | ICD-10-CM | POA: Diagnosis not present

## 2021-03-19 DIAGNOSIS — I10 Essential (primary) hypertension: Secondary | ICD-10-CM | POA: Diagnosis not present

## 2021-03-19 DIAGNOSIS — F32A Depression, unspecified: Secondary | ICD-10-CM | POA: Diagnosis not present

## 2021-03-19 DIAGNOSIS — K219 Gastro-esophageal reflux disease without esophagitis: Secondary | ICD-10-CM | POA: Diagnosis not present

## 2021-03-19 DIAGNOSIS — Z96653 Presence of artificial knee joint, bilateral: Secondary | ICD-10-CM | POA: Diagnosis not present

## 2021-03-23 ENCOUNTER — Other Ambulatory Visit: Payer: Self-pay | Admitting: Urology

## 2021-03-23 DIAGNOSIS — R319 Hematuria, unspecified: Secondary | ICD-10-CM

## 2021-03-28 DIAGNOSIS — M25662 Stiffness of left knee, not elsewhere classified: Secondary | ICD-10-CM | POA: Diagnosis not present

## 2021-03-29 ENCOUNTER — Telehealth: Payer: Self-pay | Admitting: Family

## 2021-03-29 NOTE — Telephone Encounter (Signed)
Noted, Patient scheduled for 03/30/21 at 9:30

## 2021-03-29 NOTE — Telephone Encounter (Signed)
Patient was exposed to strep throat and cough. At the time of call no other symptoms. Patient has been scheduled for a virtual with Dr Aundra Dubin for 6/19.

## 2021-03-30 ENCOUNTER — Other Ambulatory Visit: Payer: Self-pay

## 2021-03-30 ENCOUNTER — Encounter: Payer: Self-pay | Admitting: Internal Medicine

## 2021-03-30 ENCOUNTER — Telehealth (INDEPENDENT_AMBULATORY_CARE_PROVIDER_SITE_OTHER): Payer: Medicare Other | Admitting: Internal Medicine

## 2021-03-30 VITALS — Ht 63.0 in | Wt 256.0 lb

## 2021-03-30 DIAGNOSIS — M25662 Stiffness of left knee, not elsewhere classified: Secondary | ICD-10-CM | POA: Diagnosis not present

## 2021-03-30 DIAGNOSIS — B373 Candidiasis of vulva and vagina: Secondary | ICD-10-CM | POA: Diagnosis not present

## 2021-03-30 DIAGNOSIS — Z20818 Contact with and (suspected) exposure to other bacterial communicable diseases: Secondary | ICD-10-CM | POA: Diagnosis not present

## 2021-03-30 MED ORDER — AMOXICILLIN-POT CLAVULANATE 875-125 MG PO TABS: 1.0000 | ORAL_TABLET | Freq: Two times a day (BID) | ORAL | 0 refills | Status: DC

## 2021-03-30 MED ORDER — FLUCONAZOLE 150 MG PO TABS: 150.0000 mg | ORAL_TABLET | Freq: Once | ORAL | 0 refills | Status: DC

## 2021-03-30 NOTE — Progress Notes (Signed)
Telephone Note  I connected  Jacqueline Delgado  on 03/30/21 at  9:30 AM EDT by telephone and verified that I am speaking with the correct person using two identifiers.  Location patient: home, [place] Location provider:work or home office Persons participating in the virtual visit: patient, provider Grandkid and great grand kids   I discussed the limitations of evaluation and management by telemedicine and the availability of in person appointments. The patient expressed understanding and agreed to proceed.   HPI:  Acute telemedicine visit for : -exposed to strep throat great grand daughter 17 y.o lives with her has strep and scarlet fever  She had total knee replacement 03/06/21 and wants to know precautions to take and possibly get meds for tx if feels over the weekend denies sore throat and fever   -COVID-19 vaccine status: had 1 pfizer   ROS: See pertinent positives and negatives per HPI.  Past Medical History:  Diagnosis Date   Anemia    H/O   Anginal pain ([place])     3/8-12/21/10   Arthritis    Osteoarthritis in BLE knee   Colon polyp    GERD (gastroesophageal reflux disease)    Hiatal hernia    History of hiatal hernia    History of methicillin resistant staphylococcus aureus (MRSA)    Hypertension    controlled   Obesity    Reflux    Sleep apnea    NO CPAP-SLEEP STUDY FROM 2021 WAS NEGATIVE PER PT   Small vessel disease ([place])     Past Surgical History:  Procedure Laterality Date   ABDOMINAL HYSTERECTOMY     partial; per patient has left ovary   CHOLECYSTECTOMY     COLONOSCOPY W/ POLYPECTOMY     adenomatous colon polyp   COLONOSCOPY WITH PROPOFOL N/A 12/10/2017   Procedure: COLONOSCOPY WITH PROPOFOL;  Surgeon: Manya Silvas, MD;  Location: [facility] ENDOSCOPY;  Service: Endoscopy;  Laterality: N/A;   ESOPHAGOGASTRODUODENOSCOPY (EGD) WITH PROPOFOL N/A 10/11/2015   Procedure: ESOPHAGOGASTRODUODENOSCOPY (EGD) WITH PROPOFOL;  Surgeon: Manya Silvas, MD;  Location[facility]  ENDOSCOPY;  Service: Endoscopy;  Laterality: N/A;   ESOPHAGOGASTRODUODENOSCOPY (EGD) WITH PROPOFOL N/A 12/10/2017   Procedure: ESOPHAGOGASTRODUODENOSCOPY (EGD) WITH PROPOFOL;  Surgeon: Manya Silvas, MD;  Location: [facility] ENDOSCOPY;  Service: Endoscopy;  Laterality: N/A;   JOINT REPLACEMENT Right    knee, Oct 2012   JOINT REPLACEMENT     hopefully getting left partial knee replacement in July 2016   SAVORY DILATION N/A 10/11/2015   Procedure: SAVORY DILATION;  Surgeon: Manya Silvas, MD;  Location: [facility] ENDOSCOPY;  Service: Endoscopy;  Laterality: N/A;   SHOULDER ARTHROSCOPY WITH ROTATOR CUFF REPAIR AND SUBACROMIAL DECOMPRESSION Right 06/14/2015   Procedure: SHOULDER ARTHROSCOPY WITH mini open ROTATOR CUFF REPAIR AND SUBACROMIAL DECOMPRESSION, release long head biceps tendon.;  Surgeon: Leanor Kail, MD;  Location: [facility] ORS;  Service: Orthopedics;  Laterality: Right;   TOTAL KNEE ARTHROPLASTY Left 03/06/2021   Procedure: TOTAL KNEE ARTHROPLASTY - Rachelle Hora to Assist;  Surgeon: Hessie Knows, MD;  Location: [facility] ORS;  Service: Orthopedics;  Laterality: Left;     Current Outpatient Medications:    acetaminophen (TYLENOL) 500 MG tablet, Take 1-2 tablets (500-1,000 mg total) by mouth every 6 (six) hours as needed for mild pain (pain score 1-3 or temp > 100.5)., Disp: , Rfl:    ACIPHEX 20 MG tablet, Take 1 tablet (20 mg total) by mouth in the morning and at bedtime., Disp: 60 tablet, Rfl: 5   amoxicillin-clavulanate (  AUGMENTIN) 875-125 MG tablet, Take 1 tablet by mouth 2 (two) times daily., Disp: 14 tablet, Rfl: 0   fluconazole (DIFLUCAN) 150 MG tablet, Take 1 tablet (150 mg total) by mouth once for 1 dose., Disp: 1 tablet, Rfl: 0   fluticasone (FLONASE) 50 MCG/ACT nasal spray, Place 2 sprays into both nostrils daily as needed for allergies or rhinitis., Disp: , Rfl:    hydrochlorothiazide (HYDRODIURIL) 25 MG tablet, Take 1 tablet (25 mg total) by mouth daily., Disp: 90 tablet, Rfl: 3    losartan (COZAAR) 50 MG tablet, TAKE 1 TABLET BY MOUTH EVERY DAY (Patient taking differently: Take 50 mg by mouth daily after lunch.), Disp: 90 tablet, Rfl: 1   ondansetron (ZOFRAN ODT) 4 MG disintegrating tablet, Take 1 tablet (4 mg total) by mouth every 8 (eight) hours as needed for nausea or vomiting., Disp: 30 tablet, Rfl: 1   oxybutynin (DITROPAN-XL) 10 MG 24 hr tablet, Take 10 mg by mouth at bedtime., Disp: , Rfl:    oxyCODONE (OXY IR/ROXICODONE) 5 MG immediate release tablet, Take 1-2 tablets (5-10 mg total) by mouth every 4 (four) hours as needed for moderate pain (pain score 4-6)., Disp: 30 tablet, Rfl: 0   TRULICITY 1.06 YI/9.4WN SOPN, INJECT 0.75 MG INTO THE SKIN ONCE A WEEK., Disp: 15 mL, Rfl: 2   benzonatate (TESSALON PERLES) 100 MG capsule, Take 1 capsule (100 mg total) by mouth 3 (three) times daily as needed for cough. (Patient not taking: No sig reported), Disp: 30 capsule, Rfl: 0   docusate sodium (COLACE) 100 MG capsule, Take 1 capsule (100 mg total) by mouth 2 (two) times daily. (Patient not taking: Reported on 03/30/2021), Disp: 10 capsule, Rfl: 0   enoxaparin (LOVENOX) 40 MG/0.4ML injection, Inject 0.4 mLs (40 mg total) into the skin daily for 14 days. (Patient not taking: Reported on 03/30/2021), Disp: 5.6 mL, Rfl: 0   famotidine (PEPCID) 40 MG tablet, Take 40 mg by mouth at bedtime. (Patient not taking: Reported on 03/30/2021), Disp: , Rfl:    methocarbamol (ROBAXIN) 500 MG tablet, Take 1 tablet (500 mg total) by mouth every 6 (six) hours as needed for muscle spasms. (Patient not taking: Reported on 03/30/2021), Disp: 30 tablet, Rfl: 0   potassium chloride SA (KLOR-CON) 20 MEQ tablet, Take 2 tabs (40 MEQ) by mouth for 5 days, then continue to take 1 tab (20 MEQ) by mouth once a day. (Patient not taking: No sig reported), Disp: 90 tablet, Rfl: 3   torsemide (DEMADEX) 20 MG tablet, Take 1 tablet (20 mg total) by mouth as needed. for swelling (Patient not taking: Reported on 03/30/2021),  Disp: 30 tablet, Rfl: 3  EXAM:  VITALS per patient if applicable:  GENERAL: alert, oriented, appears well and in no acute distress  PSYCH/NEURO: pleasant and cooperative, no obvious depression or anxiety, speech and thought processing grossly intact  ASSESSMENT AND PLAN:  Discussed the following assessment and plan:  Strep throat exposure - Plan: amoxicillin-clavulanate (AUGMENTIN) 875-125 MG tablet  Yeast vaginitis - Plan: fluconazole (DIFLUCAN) 150 MG tablet  -we discussed possible serious and likely etiologies, options for evaluation and workup, limitations of telemedicine visit vs in person visit, treatment, treatment risks and precautions. Pt prefers to treat via telemedicine empirically rather than in person at this moment.    I discussed the assessment and treatment plan with the patient. The patient was provided an opportunity to ask questions and all were answered. The patient agreed with the plan and demonstrated an understanding of  the instructions.    Time 10 min  Delorise Jackson, MD

## 2021-03-30 NOTE — Progress Notes (Signed)
Grandchild was diagnosed with Strep and Scarlet fever, 70 years old. She sleeps in the bed with the Patient. States her grandchild broke out with rash and fever, she was diagnosed yesterday morning.   Patient states she has no symptoms but has open wounds from recent surgery.   Patient also has trouble sleeping.

## 2021-04-02 DIAGNOSIS — M25662 Stiffness of left knee, not elsewhere classified: Secondary | ICD-10-CM | POA: Diagnosis not present

## 2021-04-04 DIAGNOSIS — M25662 Stiffness of left knee, not elsewhere classified: Secondary | ICD-10-CM | POA: Diagnosis not present

## 2021-04-06 DIAGNOSIS — M25662 Stiffness of left knee, not elsewhere classified: Secondary | ICD-10-CM | POA: Diagnosis not present

## 2021-04-09 DIAGNOSIS — M25662 Stiffness of left knee, not elsewhere classified: Secondary | ICD-10-CM | POA: Diagnosis not present

## 2021-04-11 DIAGNOSIS — M25662 Stiffness of left knee, not elsewhere classified: Secondary | ICD-10-CM | POA: Diagnosis not present

## 2021-04-12 DIAGNOSIS — K219 Gastro-esophageal reflux disease without esophagitis: Secondary | ICD-10-CM | POA: Diagnosis not present

## 2021-04-12 DIAGNOSIS — R053 Chronic cough: Secondary | ICD-10-CM | POA: Diagnosis not present

## 2021-04-12 DIAGNOSIS — K222 Esophageal obstruction: Secondary | ICD-10-CM | POA: Diagnosis not present

## 2021-04-12 DIAGNOSIS — K449 Diaphragmatic hernia without obstruction or gangrene: Secondary | ICD-10-CM | POA: Diagnosis not present

## 2021-04-12 DIAGNOSIS — K224 Dyskinesia of esophagus: Secondary | ICD-10-CM | POA: Diagnosis not present

## 2021-04-12 DIAGNOSIS — K5903 Drug induced constipation: Secondary | ICD-10-CM | POA: Diagnosis not present

## 2021-04-13 DIAGNOSIS — M25662 Stiffness of left knee, not elsewhere classified: Secondary | ICD-10-CM | POA: Diagnosis not present

## 2021-04-18 DIAGNOSIS — M25662 Stiffness of left knee, not elsewhere classified: Secondary | ICD-10-CM | POA: Diagnosis not present

## 2021-04-18 DIAGNOSIS — M1712 Unilateral primary osteoarthritis, left knee: Secondary | ICD-10-CM | POA: Diagnosis not present

## 2021-04-18 DIAGNOSIS — Z96652 Presence of left artificial knee joint: Secondary | ICD-10-CM | POA: Diagnosis not present

## 2021-04-23 ENCOUNTER — Ambulatory Visit (INDEPENDENT_AMBULATORY_CARE_PROVIDER_SITE_OTHER): Payer: Medicare Other | Admitting: Family

## 2021-04-23 ENCOUNTER — Encounter: Payer: Self-pay | Admitting: Family

## 2021-04-23 ENCOUNTER — Other Ambulatory Visit: Payer: Self-pay

## 2021-04-23 VITALS — BP 118/68 | HR 94 | Ht 63.0 in | Wt 250.8 lb

## 2021-04-23 DIAGNOSIS — Z6841 Body Mass Index (BMI) 40.0 and over, adult: Secondary | ICD-10-CM

## 2021-04-23 DIAGNOSIS — Z87898 Personal history of other specified conditions: Secondary | ICD-10-CM | POA: Diagnosis not present

## 2021-04-23 DIAGNOSIS — M1712 Unilateral primary osteoarthritis, left knee: Secondary | ICD-10-CM | POA: Diagnosis not present

## 2021-04-23 DIAGNOSIS — J309 Allergic rhinitis, unspecified: Secondary | ICD-10-CM

## 2021-04-23 DIAGNOSIS — G8929 Other chronic pain: Secondary | ICD-10-CM | POA: Diagnosis not present

## 2021-04-23 DIAGNOSIS — N3281 Overactive bladder: Secondary | ICD-10-CM

## 2021-04-23 DIAGNOSIS — Z1231 Encounter for screening mammogram for malignant neoplasm of breast: Secondary | ICD-10-CM

## 2021-04-23 DIAGNOSIS — I10 Essential (primary) hypertension: Secondary | ICD-10-CM

## 2021-04-23 DIAGNOSIS — M25562 Pain in left knee: Secondary | ICD-10-CM

## 2021-04-23 DIAGNOSIS — E662 Morbid (severe) obesity with alveolar hypoventilation: Secondary | ICD-10-CM | POA: Diagnosis not present

## 2021-04-23 MED ORDER — DULOXETINE HCL 30 MG PO CPEP
30.0000 mg | ORAL_CAPSULE | Freq: Every day | ORAL | 3 refills | Status: DC
Start: 2021-04-23 — End: 2021-05-23

## 2021-04-23 MED ORDER — FLUTICASONE PROPIONATE 50 MCG/ACT NA SUSP: 2.0000 | Freq: Every day | NASAL | 3 refills | Status: DC | PRN

## 2021-04-23 MED ORDER — TRULICITY 3 MG/0.5ML ~~LOC~~ SOAJ: 3.0000 mg | SUBCUTANEOUS | 1 refills | Status: DC

## 2021-04-23 MED ORDER — LIDOCAINE 5 % EX PTCH
1.0000 | MEDICATED_PATCH | CUTANEOUS | 1 refills | Status: DC
Start: 2021-04-23 — End: 2021-08-01

## 2021-04-23 NOTE — Patient Instructions (Addendum)
Ice 3 times per day, and after PT.  Lidocaine patches  Mammogram as scheduled  Start cymbalta 30mg   Nice to see you!

## 2021-04-23 NOTE — Progress Notes (Signed)
Subjective:    Patient ID: Jacqueline Delgado, female    DOB: [birth year], 70 y.o.   MRN: [id number]  CC: Jacqueline Delgado is a 70 y.o. female who presents today for follow up.   HPI: Compliant with trulicity and pleased with weight loss. She has gained 2 lbs and thinks the effect is waned. She is interested in increasing medication. No constipation, abdominal pain, vomiting.   S/p total LKR with Dr Rudene Christians.   She continues to pain and stiffness in leg which interferes with sleep. She uses oxycodone 5mg  twice per week. Using tylenol without relief. She had been icing and stopped icing a week or so ago.    HTN- compliant with losartan 50mg , hctz 25mg  , potassium chloride 20 med qd Leg swelling resolved. She longer requires the demadex as needed  GERD and esophageal spasm- she has started mylanta prn with improvement of epigastric pain. She is avoiding spicy and tomato based foods. Compliant with aciphex 20mg  bid.  She stopped ibuprofen and mobic due to stomach upset in 2012.   EGD 11/2017 with no mention of esophagitis   No h/o GIB, CKD.   OAB- compliant with oxybutynin which has been very helpful for urinary urgency.      Mammogram Due  Potassium 3.9  on 03/09/21 HISTORY:  Past Medical History:  Diagnosis Date   Anemia    H/O   Anginal pain ([place])     3/8-12/21/10   Arthritis    Osteoarthritis in BLE knee   Colon polyp    GERD (gastroesophageal reflux disease)    Hiatal hernia    History of hiatal hernia    History of methicillin resistant staphylococcus aureus (MRSA)    Hypertension    controlled   Obesity    Reflux    Sleep apnea    NO CPAP-SLEEP STUDY FROM 2021 WAS NEGATIVE PER PT   Small vessel disease ([place])    Past Surgical History:  Procedure Laterality Date   ABDOMINAL HYSTERECTOMY     partial; per patient has left ovary   CHOLECYSTECTOMY     COLONOSCOPY W/ POLYPECTOMY     adenomatous colon polyp   COLONOSCOPY WITH PROPOFOL N/A 12/10/2017   Procedure: COLONOSCOPY  WITH PROPOFOL;  Surgeon: Manya Silvas, MD;  Location: [facility] ENDOSCOPY;  Service: Endoscopy;  Laterality: N/A;   ESOPHAGOGASTRODUODENOSCOPY (EGD) WITH PROPOFOL N/A 10/11/2015   Procedure: ESOPHAGOGASTRODUODENOSCOPY (EGD) WITH PROPOFOL;  Surgeon: Manya Silvas, MD;  Location: [facility] ENDOSCOPY;  Service: Endoscopy;  Laterality: N/A;   ESOPHAGOGASTRODUODENOSCOPY (EGD) WITH PROPOFOL N/A 12/10/2017   Procedure: ESOPHAGOGASTRODUODENOSCOPY (EGD) WITH PROPOFOL;  Surgeon: Manya Silvas, MD;  Location[facility] END[facility]COPY;  Service: Endoscopy;  Laterality: N/A;   JOINT REPLACEMENT Right    knee, Oct 2012   JOINT REPLACEMENT     hopefully getting left partial knee replacement in July 2016   SAVORY DILATION N/A 10/11/2015   Procedure: SAVORY DILATION;  Surgeon: Manya Silvas, MD;  Location: [facility] ENDOSCOPY;  Service: Endoscopy;  Laterality: N/A;   SHOULDER ARTHROSCOPY WITH ROTATOR CUFF REPAIR AND SUBACROMIAL DECOMPRESSION Right 06/14/2015   Procedure: SHOULDER ARTHROSCOPY WITH mini open ROTATOR CUFF REPAIR AND SUBACROMIAL DECOMPRESSION, release long head biceps tendon.;  Surgeon: Leanor Kail, MD;  Location: [facility] ORS;  Service: Orthopedics;  Laterality: Right;   TOTAL KNEE ARTHROPLASTY Left 03/06/2021   Procedure: TOTAL KNEE ARTHROPLASTY - Rachelle Hora to Assist;  Surgeon: Hessie Knows, MD;  Location: [facility] ORS;  Service: Orthopedics;  Laterality: Left;  Family History  Problem Relation Age of Onset   Alcoholism Other        brother   Arthritis Other        parent   Breast cancer Other        mother and grandmother   Hyperlipidemia Father    Heart disease Father    Stroke Father    Hypertension Father    Diabetes Other        parent and other family member   Breast cancer Mother 56   Breast cancer Maternal Grandmother        young   Thyroid cancer Neg Hx     Allergies: Mobic [meloxicam] Current Outpatient Medications on File Prior to Visit  Medication Sig Dispense Refill    acetaminophen (TYLENOL) 500 MG tablet Take 1-2 tablets (500-1,000 mg total) by mouth every 6 (six) hours as needed for mild pain (pain score 1-3 or temp > 100.5).     ACIPHEX 20 MG tablet Take 1 tablet (20 mg total) by mouth in the morning and at bedtime. 60 tablet 5   hydrochlorothiazide (HYDRODIURIL) 25 MG tablet Take 1 tablet (25 mg total) by mouth daily. 90 tablet 3   losartan (COZAAR) 50 MG tablet TAKE 1 TABLET BY MOUTH EVERY DAY (Patient taking differently: Take 50 mg by mouth daily after lunch.) 90 tablet 1   ondansetron (ZOFRAN ODT) 4 MG disintegrating tablet Take 1 tablet (4 mg total) by mouth every 8 (eight) hours as needed for nausea or vomiting. 30 tablet 1   oxybutynin (DITROPAN-XL) 10 MG 24 hr tablet Take 10 mg by mouth at bedtime.     oxyCODONE (OXY IR/ROXICODONE) 5 MG immediate release tablet Take 1-2 tablets (5-10 mg total) by mouth every 4 (four) hours as needed for moderate pain (pain score 4-6). 30 tablet 0   No current facility-administered medications on file prior to visit.    Social History   Tobacco Use   Smoking status: Never   Smokeless tobacco: Never  Vaping Use   Vaping Use: Never used  Substance Use Topics   Alcohol use: No   Drug use: No    Review of Systems  Constitutional:  Negative for chills and fever.  Respiratory:  Negative for cough.   Cardiovascular:  Negative for chest pain and palpitations.  Gastrointestinal:  Negative for abdominal pain, nausea and vomiting.  Genitourinary:  Negative for urgency.  Musculoskeletal:  Positive for arthralgias and joint swelling. Negative for myalgias.     Objective:    BP 118/68 (BP Location: Left Arm, Patient Position: Sitting, Cuff Size: Large)   Pulse 94   Ht 5\' 3"  (1.6 m)   Wt 250 lb 12.8 oz (113.8 kg)   SpO2 97%   BMI 44.43 kg/m  BP Readings from Last 3 Encounters:  04/23/21 118/68  03/13/21 117/66  02/26/21 125/85   Wt Readings from Last 3 Encounters:  04/23/21 250 lb 12.8 oz (113.8 kg)   03/30/21 256 lb (116.1 kg)  03/06/21 261 lb 0.4 oz (118.4 kg)    Physical Exam Vitals reviewed.  Constitutional:      Appearance: She is well-developed.  Eyes:     Conjunctiva/sclera: Conjunctivae normal.  Cardiovascular:     Rate and Rhythm: Normal rate and regular rhythm.     Pulses: Normal pulses.     Heart sounds: Normal heart sounds.  Pulmonary:     Effort: Pulmonary effort is normal.     Breath sounds: Normal breath sounds. No  wheezing, rhonchi or rales.  Musculoskeletal:     Right knee: Normal. No bony tenderness. No tenderness.     Left knee: Swelling present. Decreased range of motion.       Legs:     Comments: Left knee generalized edema, non pitting. Able to flex with some pain. No increased heat, erythema.   Skin:    General: Skin is warm and dry.  Neurological:     Mental Status: She is alert.  Psychiatric:        Speech: Speech normal.        Behavior: Behavior normal.        Thought Content: Thought content normal.       Assessment & Plan:   Problem List Items Addressed This Visit       Cardiovascular and Mediastinum   Essential hypertension    Excellent control. Continue losartan 50mg , hctz 25mg  , potassium chloride 20 med qd         Respiratory   Class 3 obesity with alveolar hypoventilation, serious comorbidity, and body mass index (BMI) of 50.0 to 59.9 in adult (HCC) (Chronic)   Relevant Medications   Dulaglutide (TRULICITY) 3 JQ/7.3AL SOPN     Musculoskeletal and Integument   Osteoarthritis of knee (Left) - Primary (Chronic)   Relevant Medications   lidocaine (LIDODERM) 5 %   DULoxetine (CYMBALTA) 30 MG capsule     Genitourinary   OAB (overactive bladder)    Stable, improved. Continue oxybutynin 10mg .           Other   Chronic knee pain (3ry area of Pain) (Left) (Chronic)    S/p TKR. Emphasizing ice 3 times per day and after PT. Will start cymbalta for pain management due to intolerance to mobic.        Relevant Medications    DULoxetine (CYMBALTA) 30 MG capsule   BMI 40.0-44.9, adult ([place])    Congratulated patient on weight loss. Agreed that increasing Trulicity to 3mg  is very appropriate.        Relevant Medications   Dulaglutide (TRULICITY) 3 PF/7.9KW SOPN   Screening for breast cancer   Relevant Orders   MM 3D SCREEN BREAST BILATERAL   Other Visit Diagnoses     Allergic rhinitis, unspecified seasonality, unspecified trigger       Relevant Medications   fluticasone (FLONASE) 50 MCG/ACT nasal spray   History of prediabetes       Relevant Orders   Hemoglobin A1c        I have discontinued Jacqueline Delgado's famotidine, potassium chloride SA, torsemide, benzonatate, Trulicity, methocarbamol, enoxaparin, docusate sodium, and amoxicillin-clavulanate. I am also having her start on lidocaine, DULoxetine, and Trulicity. Additionally, I am having her maintain her Aciphex, oxybutynin, ondansetron, losartan, hydrochlorothiazide, oxyCODONE, acetaminophen, and fluticasone.   Meds ordered this encounter  Medications   lidocaine (LIDODERM) 5 %    Sig: Place 1 patch onto the skin daily. Remove & Discard patch within 12 hours.    Dispense:  30 patch    Refill:  1    Order Specific Question:   Supervising Provider    Answer:   Deborra Medina L [[id number]]   DULoxetine (CYMBALTA) 30 MG capsule    Sig: Take 1 capsule (30 mg total) by mouth daily.    Dispense:  90 capsule    Refill:  3    Order Specific Question:   Supervising Provider    Answer:   Deborra Medina L [[id number]]   fluticasone (FLONASE) 50 MCG/ACT  nasal spray    Sig: Place 2 sprays into both nostrils daily as needed for allergies or rhinitis.    Dispense:  16 g    Refill:  3    Order Specific Question:   Supervising Provider    Answer:   Deborra Medina L [[id number]]   Dulaglutide (TRULICITY) 3 PN/2.2ZY SOPN    Sig: Inject 3 mg as directed once a week.    Dispense:  3 mL    Refill:  1    Order Specific Question:   Supervising Provider    Answer:   Crecencio Mc [[id number]]    Return precautions given.   Risks, benefits, and alternatives of the medications and treatment plan prescribed today were discussed, and patient expressed understanding.   Education regarding symptom management and diagnosis given to patient on AVS.  Continue to follow with Burnard Hawthorne, FNP for routine health maintenance.   [name] and I agreed with plan.   Mable Paris, FNP

## 2021-04-24 DIAGNOSIS — M25662 Stiffness of left knee, not elsewhere classified: Secondary | ICD-10-CM | POA: Diagnosis not present

## 2021-04-24 LAB — HEMOGLOBIN A1C: Hgb A1c MFr Bld: 5.5 % (ref 4.6–6.5)

## 2021-04-24 NOTE — Assessment & Plan Note (Signed)
Stable, improved. Continue oxybutynin 10mg .

## 2021-04-24 NOTE — Assessment & Plan Note (Addendum)
S/p TKR. Emphasizing ice 3 times per day and after PT. Will start cymbalta 30mg , lidocaine patch prn for pain management due to intolerance to mobic.

## 2021-04-24 NOTE — Assessment & Plan Note (Signed)
Excellent control. Continue losartan 50mg , hctz 25mg  , potassium chloride 20 med qd

## 2021-04-24 NOTE — Assessment & Plan Note (Signed)
Congratulated patient on weight loss. Agreed that increasing Trulicity to 3mg  is very appropriate.

## 2021-05-01 DIAGNOSIS — M25662 Stiffness of left knee, not elsewhere classified: Secondary | ICD-10-CM | POA: Diagnosis not present

## 2021-05-03 DIAGNOSIS — M25662 Stiffness of left knee, not elsewhere classified: Secondary | ICD-10-CM | POA: Diagnosis not present

## 2021-05-04 ENCOUNTER — Ambulatory Visit: Payer: Medicare Other | Admitting: Cardiology

## 2021-05-08 DIAGNOSIS — Z96652 Presence of left artificial knee joint: Secondary | ICD-10-CM | POA: Diagnosis not present

## 2021-05-08 DIAGNOSIS — M25562 Pain in left knee: Secondary | ICD-10-CM | POA: Diagnosis not present

## 2021-05-10 DIAGNOSIS — Z96652 Presence of left artificial knee joint: Secondary | ICD-10-CM | POA: Diagnosis not present

## 2021-05-11 ENCOUNTER — Other Ambulatory Visit: Payer: Self-pay

## 2021-05-11 ENCOUNTER — Ambulatory Visit
Admission: RE | Admit: 2021-05-11 | Discharge: 2021-05-11 | Disposition: A | Discharge status: Home / Self Care | Payer: Medicare Other | Source: Ambulatory Visit | Attending: Family | Admitting: Family

## 2021-05-11 DIAGNOSIS — Z1231 Encounter for screening mammogram for malignant neoplasm of breast: Secondary | ICD-10-CM | POA: Diagnosis not present

## 2021-05-12 ENCOUNTER — Other Ambulatory Visit: Payer: Self-pay | Admitting: Family

## 2021-05-15 DIAGNOSIS — Z96652 Presence of left artificial knee joint: Secondary | ICD-10-CM | POA: Diagnosis not present

## 2021-05-15 DIAGNOSIS — M25562 Pain in left knee: Secondary | ICD-10-CM | POA: Diagnosis not present

## 2021-05-17 DIAGNOSIS — M25562 Pain in left knee: Secondary | ICD-10-CM | POA: Diagnosis not present

## 2021-05-17 DIAGNOSIS — Z96652 Presence of left artificial knee joint: Secondary | ICD-10-CM | POA: Diagnosis not present

## 2021-05-21 ENCOUNTER — Telehealth: Payer: Self-pay | Admitting: Family

## 2021-05-21 ENCOUNTER — Telehealth: Payer: Medicare Other | Admitting: Family Medicine

## 2021-05-21 ENCOUNTER — Other Ambulatory Visit: Payer: Self-pay

## 2021-05-21 DIAGNOSIS — Z538 Procedure and treatment not carried out for other reasons: Secondary | ICD-10-CM

## 2021-05-21 NOTE — Telephone Encounter (Signed)
Pt is scheduled with Mable Paris, NP on 05/23/2021 for UTI symptoms.

## 2021-05-21 NOTE — Progress Notes (Deleted)
[place] at [facility] [address], [place], [place] [address] [phone]  Date:  05/21/2021   Name:  Jacqueline Delgado   DOB:  09/23/1951   MRN:  [id number]  PCP:  Burnard Hawthorne, FNP    Chief Complaint: No chief complaint on file.   History of Present Illness:  Jacqueline Delgado is a 70 y.o. very pleasant female patient who presents with the following:  Virtual visit today for concern of UTI symptoms Primary patient of Mable Paris, FNP I have not seen this patient myself previously  Patient location is home, my location is office.  Patient identified with 2 factors, she gives consent for virtual visit today  She underwent a left total knee replacement in June History of sleep apnea, chronic spine disease, hypertension, obesity  She had a urine culture in May which was positive for both pansensitive E. coli and strep agalactiae Renal function is normal Patient Active Problem List   Diagnosis Date Noted   S/P TKR (total knee replacement) using cement, left 03/06/2021   Nasal congestion 02/21/2021   OAB (overactive bladder) Q000111Q   Uncomplicated opioid dependence ([place]) 10/04/2020   Chronic low back pain (Bilateral) (R>L) w/o sciatica 09/18/2020   Spondylosis without myelopathy or radiculopathy, lumbosacral region 09/18/2020   Lumbosacral facet syndrome (Bilateral) (R>L) 08/23/2020   Loose stools 08/21/2020   BMI 40.0-44.9, adult ([place]) 08/21/2020   Hypokalemia 07/18/2020   Hypocalcemia 07/18/2020   Hypoalbuminemia 07/18/2020   Chronic lower extremity pain (2ry area of Pain) (Bilateral) (R>L) 05/23/2020   Class 3 obesity with alveolar hypoventilation, serious comorbidity, and body mass index (BMI) of 50.0 to 59.9 in adult ([place]) 05/23/2020   Elevated C-reactive protein (CRP) 05/23/2020   Elevated sed rate 05/23/2020   Calcaneal spur of foot (Right) 05/23/2020   Lumbar Grade 1 Anterolisthesis of L4/L5 05/23/2020    Chronic pain syndrome 05/22/2020   Pharmacologic therapy 05/22/2020   Disorder of skeletal system 05/22/2020   Problems influencing health status 05/22/2020   DDD (degenerative disc disease), lumbosacral 05/22/2020   Lumbar facet hypertrophy 05/22/2020   Abnormal MRI, lumbar spine (04/12/2013) 05/22/2020   Lumbosacral central spinal stenosis (9 mm) (L4-5 and L5-S1) 05/22/2020   Chronic lower extremity pain (Right) 05/22/2020   Lumbosacral radiculopathy at L5 (Right) 05/22/2020   Chronic knee pain (3ry area of Pain) (Left) 05/22/2020   Swelling of foot (4th area of Pain) (Right) 05/22/2020   Pneumonia due to COVID-19 virus 04/09/2020   Ankle swelling, right 04/03/2020   Hiatal hernia 02/18/2020   Small vessel vasculitis ([place]) 02/18/2020   Non-cardiac chest pain 02/18/2020   Vaginal discharge 10/01/2019   SOB (shortness of breath) on exertion 09/23/2019   Anemia 09/23/2019   B12 deficiency 09/23/2019   Vitamin D deficiency 09/23/2019   Hematuria 05/14/2019   Hot flashes 05/14/2019   Screening for breast cancer 05/14/2019   Skin fissures 04/22/2019   Fatigue 12/09/2018   Localized swelling of finger of left hand 11/18/2018   Cough 11/11/2018   Genital herpes simplex 11/11/2018   Essential hypertension 11/11/2018   Plantar fasciitis 05/03/2016   OSA (obstructive sleep apnea) 04/23/2016   Acquired trigger finger 11/23/2015   Depression, major, recurrent, moderate ([place]) 07/06/2015   Closed nondisplaced fracture of base of fifth metacarpal bone of right hand 06/22/2015   S/P rotator cuff repair 06/22/2015   Complete tear of right rotator cuff 05/23/2015   Osteoarthritis of knee (Left) 02/22/2015  DDD (degenerative disc disease), cervical 08/03/2014   DDD (degenerative disc disease), lumbar 08/03/2014   Leukocytoclastic vasculitis ([place]) 08/03/2014   Neck pain 08/03/2014   Cerebral vascular malformation 08/02/2014   Unspecified osteoarthritis, unspecified site 07/05/2014   GERD  (gastroesophageal reflux disease) 09/20/2013   Chronic low back pain (1ry area of Pain) (Bilateral) (R>L) w/ sciatica (Right) 09/20/2013   Osteoarthritis, knee 09/20/2013   Essential (primary) hypertension 09/20/2013   Adenomatous colon polyp 03/21/2006    Past Medical History:  Diagnosis Date   Anemia    H/O   Anginal pain ([place])     3/8-12/21/10   Arthritis    Osteoarthritis in BLE knee   Colon polyp    GERD (gastroesophageal reflux disease)    Hiatal hernia    History of hiatal hernia    History of methicillin resistant staphylococcus aureus (MRSA)    Hypertension    controlled   Obesity    Reflux    Sleep apnea    NO CPAP-SLEEP STUDY FROM 2021 WAS NEGATIVE PER PT   Small vessel disease ([place])     Past Surgical History:  Procedure Laterality Date   ABDOMINAL HYSTERECTOMY     partial; per patient has left ovary   CHOLECYSTECTOMY     COLONOSCOPY W/ POLYPECTOMY     adenomatous colon polyp   COLONOSCOPY WITH PROPOFOL N/A 12/10/2017   Procedure: COLONOSCOPY WITH PROPOFOL;  Surgeon: Manya Silvas, MD;  Location[facility] ENDOSCOPY;  Service: Endoscopy;  Laterality: N/A;   ESOPHAGOGASTRODUODENOSCOPY (EGD) WITH PROPOFOL N/A 10/11/2015   Procedure: ESOPHAGOGASTRODUODENOSCOPY (EGD) WITH PROPOFOL;  Surgeon: Manya Silvas, MD;  Location: [facility] ENDOSCOPY;  Service: Endoscopy;  Laterality: N/A;   ESOPHAGOGASTRODUODENOSCOPY (EGD) WITH PROPOFOL N/A 12/10/2017   Procedure: ESOPHAGOGASTRODUODENOSCOPY (EGD) WITH PROPOFOL;  Surgeon: Manya Silvas, MD;  Location: [facility] ENDOSCOPY;  Service: Endoscopy;  Laterality: N/A;   JOINT REPLACEMENT Right    knee, Oct 2012   JOINT REPLACEMENT     hopefully getting left partial knee replacement in July 2016   SAVORY DILATION N/A 10/11/2015   Procedure: SAVORY DILATION;  Surgeon: Manya Silvas, MD;  Location: [facility] ENDOSCOPY;  Service: Endoscopy;  Laterality: N/A;   SHOULDER ARTHROSCOPY WITH ROTATOR CUFF REPAIR AND SUBACROMIAL DECOMPRESSION Right  06/14/2015   Procedure: SHOULDER ARTHROSCOPY WITH mini open ROTATOR CUFF REPAIR AND SUBACROMIAL DECOMPRESSION, release long head biceps tendon.;  Surgeon: Leanor Kail, MD;  Location: [facility] ORS;  Service: Orthopedics;  Laterality: Right;   TOTAL KNEE ARTHROPLASTY Left 03/06/2021   Procedure: TOTAL KNEE ARTHROPLASTY - Rachelle Hora to Assist;  Surgeon: Hessie Knows, MD;  Location: [facility] ORS;  Service: Orthopedics;  Laterality: Left;    Social History   Tobacco Use   Smoking status: Never   Smokeless tobacco: Never  Vaping Use   Vaping Use: Never used  Substance Use Topics   Alcohol use: No   Drug use: No    Family History  Problem Relation Age of Onset   Breast cancer Mother 23   Hyperlipidemia Father    Heart disease Father    Stroke Father    Hypertension Father    Breast cancer Maternal Grandmother        young   Alcoholism Other        brother   Arthritis Other        parent   Diabetes Other        parent and other family member   Thyroid cancer Neg Hx     Allergies  Allergen Reactions   Mobic [Meloxicam]     'stomach upset'     Medication list has been reviewed and updated.  Current Outpatient Medications on File Prior to Visit  Medication Sig Dispense Refill   acetaminophen (TYLENOL) 500 MG tablet Take 1-2 tablets (500-1,000 mg total) by mouth every 6 (six) hours as needed for mild pain (pain score 1-3 or temp > 100.5).     ACIPHEX 20 MG tablet TAKE 1 TABLET (20 MG TOTAL) BY MOUTH IN THE MORNING AND AT BEDTIME.(DAW 1-BRAND NAME) 180 tablet 1   Dulaglutide (TRULICITY) 3 0000000 SOPN Inject 3 mg as directed once a week. 3 mL 1   DULoxetine (CYMBALTA) 30 MG capsule Take 1 capsule (30 mg total) by mouth daily. 90 capsule 3   fluticasone (FLONASE) 50 MCG/ACT nasal spray Place 2 sprays into both nostrils daily as needed for allergies or rhinitis. 16 g 3   hydrochlorothiazide (HYDRODIURIL) 25 MG tablet Take 1 tablet (25 mg total) by mouth daily. 90 tablet 3    lidocaine (LIDODERM) 5 % Place 1 patch onto the skin daily. Remove & Discard patch within 12 hours. 30 patch 1   losartan (COZAAR) 50 MG tablet TAKE 1 TABLET BY MOUTH EVERY DAY (Patient taking differently: Take 50 mg by mouth daily after lunch.) 90 tablet 1   ondansetron (ZOFRAN ODT) 4 MG disintegrating tablet Take 1 tablet (4 mg total) by mouth every 8 (eight) hours as needed for nausea or vomiting. 30 tablet 1   oxybutynin (DITROPAN-XL) 10 MG 24 hr tablet Take 10 mg by mouth at bedtime.     oxyCODONE (OXY IR/ROXICODONE) 5 MG immediate release tablet Take 1-2 tablets (5-10 mg total) by mouth every 4 (four) hours as needed for moderate pain (pain score 4-6). 30 tablet 0   No current facility-administered medications on file prior to visit.    Review of Systems:  As per HPI- otherwise negative.   Physical Examination: There were no vitals filed for this visit. There were no vitals filed for this visit. There is no height or weight on file to calculate BMI. Ideal Body Weight:    ***  Assessment and Plan: ***  Signed Lamar Blinks, MD

## 2021-05-21 NOTE — Telephone Encounter (Signed)
Patient called and thinks she has a UTI. No appointments at office. Patient was transferred to [facility] at Access Nurse.

## 2021-05-22 DIAGNOSIS — Z96652 Presence of left artificial knee joint: Secondary | ICD-10-CM | POA: Diagnosis not present

## 2021-05-22 DIAGNOSIS — M25562 Pain in left knee: Secondary | ICD-10-CM | POA: Diagnosis not present

## 2021-05-23 ENCOUNTER — Telehealth (INDEPENDENT_AMBULATORY_CARE_PROVIDER_SITE_OTHER): Payer: Medicare Other | Admitting: Family

## 2021-05-23 ENCOUNTER — Other Ambulatory Visit: Payer: Self-pay

## 2021-05-23 VITALS — BP 119/72 | HR 74 | Ht 63.0 in | Wt 242.9 lb

## 2021-05-23 DIAGNOSIS — Z6841 Body Mass Index (BMI) 40.0 and over, adult: Secondary | ICD-10-CM

## 2021-05-23 DIAGNOSIS — Z96651 Presence of right artificial knee joint: Secondary | ICD-10-CM | POA: Diagnosis not present

## 2021-05-23 DIAGNOSIS — R350 Frequency of micturition: Secondary | ICD-10-CM

## 2021-05-23 DIAGNOSIS — I10 Essential (primary) hypertension: Secondary | ICD-10-CM | POA: Diagnosis not present

## 2021-05-23 DIAGNOSIS — R300 Dysuria: Secondary | ICD-10-CM

## 2021-05-23 MED ORDER — AMOXICILLIN-POT CLAVULANATE 875-125 MG PO TABS: 1.0000 | ORAL_TABLET | Freq: Two times a day (BID) | ORAL | 0 refills | Status: AC

## 2021-05-23 NOTE — Progress Notes (Signed)
Virtual Visit via Video Note  I connected with@  on 05/23/21 at  1:30 PM EDT by a video enabled telemedicine application and verified that I am speaking with the correct person using two identifiers.  Location patient: home Location provider:work  Persons participating in the virtual visit: patient, provider  I discussed the limitations of evaluation and management by telemedicine and the availability of in person appointments. The patient expressed understanding and agreed to proceed.   HPI: Acute visit  Complains of urinary urgency x 4 days ago,improved.  She started augmentin ( old rx) with improvement.   No dysuria, fever,hematuria, fever, flank pain.    Urine culture showed e coli 02/28/21 She started augmentin however reports she didn't finish course.   She has injured knee in PT after left TKR. She is seeing PA at [facility] today.   HTN- compliant with losartan '50mg'$ , hctz '25mg'$  , potassium chloride 20 med qd  She has lost 8lb more pounds. Compliant with  trulicity '3mg'$ . She has eliminated sugars.   ROS: See pertinent positives and negatives per HPI.    EXAM:  VITALS per patient if applicable: BP Q000111Q   Pulse 74   Ht '5\' 3"'$  (1.6 m)   Wt 242 lb 14.4 oz (110.2 kg)   BMI 43.03 kg/m  BP Readings from Last 3 Encounters:  05/23/21 119/72  04/23/21 118/68  03/13/21 117/66   Wt Readings from Last 3 Encounters:  05/23/21 242 lb 14.4 oz (110.2 kg)  04/23/21 250 lb 12.8 oz (113.8 kg)  03/30/21 256 lb (116.1 kg)    GENERAL: alert, oriented, appears well and in no acute distress  HEENT: atraumatic, conjunttiva clear, no obvious abnormalities on inspection of external nose and ears  NECK: normal movements of the head and neck  LUNGS: on inspection no signs of respiratory distress, breathing rate appears normal, no obvious gross SOB, gasping or wheezing  CV: no obvious cyanosis  MS: moves all visible extremities without noticeable abnormality  PSYCH/NEURO:  pleasant and cooperative, no obvious depression or anxiety, speech and thought processing grossly intact  ASSESSMENT AND PLAN:  Discussed the following assessment and plan:  Problem List Items Addressed This Visit       Cardiovascular and Mediastinum   Essential hypertension    Excellent control. Continue  losartan '50mg'$ , hctz '25mg'$  , potassium chloride 20 med qd         Other   BMI 40.0-44.9, adult ([place])    Congratulated patient on continued weight loss.  Continue Trulicity 3 mg       Urinary frequency    Afebrile.  Patient did not complete course of Augmentin 3 months ago.  Counseled on importance of completing  antibiotic course.  She is already started Augmentin this week.  We will go ahead and collect urine culture to ensure appropriate.  Augmentin sent in.       Other Visit Diagnoses     Dysuria    -  Primary   Relevant Medications   amoxicillin-clavulanate (AUGMENTIN) 875-125 MG tablet   Other Relevant Orders   Urinalysis, Routine w reflex microscopic   Urine Culture       -we discussed possible serious and likely etiologies, options for evaluation and workup, limitations of telemedicine visit vs in person visit, treatment, treatment risks and precautions. Pt prefers to treat via telemedicine empirically rather then risking or undertaking an in person visit at this moment.  .   I discussed the assessment and treatment plan with the patient.  The patient was provided an opportunity to ask questions and all were answered. The patient agreed with the plan and demonstrated an understanding of the instructions.   The patient was advised to call back or seek an in-person evaluation if the symptoms worsen or if the condition fails to improve as anticipated.   Mable Paris, FNP

## 2021-05-23 NOTE — Assessment & Plan Note (Signed)
Excellent control. Continue  losartan '50mg'$ , hctz '25mg'$  , potassium chloride 20 med qd

## 2021-05-23 NOTE — Assessment & Plan Note (Signed)
Afebrile.  Patient did not complete course of Augmentin 3 months ago.  Counseled on importance of completing  antibiotic course.  She is already started Augmentin this week.  We will go ahead and collect urine culture to ensure appropriate.  Augmentin sent in.

## 2021-05-23 NOTE — Progress Notes (Signed)
ine

## 2021-05-23 NOTE — Assessment & Plan Note (Signed)
Congratulated patient on continued weight loss.  Continue Trulicity 3 mg

## 2021-05-24 ENCOUNTER — Telehealth: Payer: Self-pay | Admitting: Family

## 2021-05-24 NOTE — Telephone Encounter (Signed)
Patient is taking amoxicillan for 7 days and prednisone prescribed by another doctor for 6 days and she is unsure if she should take them both at the same time.Please call her at [phone].

## 2021-05-25 NOTE — Telephone Encounter (Signed)
I called patient & let her know safe to take both together, but to make sure that she was taking prednisone in the morning as it can cause trouble sleeping. I also advised daily yogurt or probiotic while taking. Pt taking amoxicillin for UTI & prednisone for inflammation of the knee.

## 2021-06-11 ENCOUNTER — Other Ambulatory Visit: Payer: Self-pay | Admitting: Family

## 2021-06-11 DIAGNOSIS — Z6841 Body Mass Index (BMI) 40.0 and over, adult: Secondary | ICD-10-CM

## 2021-06-11 DIAGNOSIS — E662 Morbid (severe) obesity with alveolar hypoventilation: Secondary | ICD-10-CM

## 2021-06-11 DIAGNOSIS — K219 Gastro-esophageal reflux disease without esophagitis: Secondary | ICD-10-CM

## 2021-06-12 ENCOUNTER — Ambulatory Visit: Payer: Medicare HMO

## 2021-06-12 ENCOUNTER — Ambulatory Visit: Payer: Medicare Other

## 2021-06-19 ENCOUNTER — Encounter: Payer: Self-pay | Admitting: Pharmacist

## 2021-06-19 NOTE — Progress Notes (Signed)
[facility])                                            [place] Team                                        Statin Quality Measure Assessment    06/19/2021  CAMYRN CATANIA 1951-01-07 [id number]   Per review of chart and payor information, patient has a diagnosis of diabetes but is not currently filling a statin prescription.  This places patient into the SUPD (Statin Use In Patients with Diabetes) measure for CMS.    I could not find any documentation of previous trial of a statin or a history of statin intolerance.   The 10-year ASCVD risk score Mikey Bussing DC Jr., et al., 2013) is: 15.6%   Values used to calculate the score:     Age: 70 years     Sex: Female     Is Non-Hispanic African American: Yes     Diabetic: Yes     Tobacco smoker: No     Systolic Blood Pressure: 123456 mmHg     Is BP treated: Yes     HDL Cholesterol: 38 mg/dL     Total Cholesterol: 134 mg/dL 09/13/2020     Component Value Date/Time   CHOL 134 09/13/2020 0912   CHOL 135 06/10/2014 0411   TRIG 64 09/13/2020 0912   TRIG 63 06/10/2014 0411   HDL 38 (L) 09/13/2020 0912   HDL 36 (L) 06/10/2014 0411   CHOLHDL 3.5 09/13/2020 0912   VLDL 13 06/10/2014 0411   LDLCALC 83 09/13/2020 0912   LDLCALC 86 06/10/2014 0411    Please consider ONE of the following recommendations:  Initiate high intensity statin Atorvastatin '40mg'$  once daily, #90, 3 refills   Rosuvastatin '20mg'$  once daily, #90, 3 refills    Initiate moderate intensity          statin with reduced frequency if prior          statin intolerance 1x weekly, #13, 3 refills   2x weekly, #26, 3 refills   3x weekly, #39, 3 refills    Code for past statin intolerance or  other exclusions (required annually)  Provider Requirements: Associate code during an office visit or telehealth encounter  Drug Induced Myopathy G72.0   Myopathy, unspecified G72.9   Myositis, unspecified M60.9    Rhabdomyolysis M62.82   Cirrhosis of liver K74.69   Prediabetes R73.03   PCOS E28.2   Adverse effect of antihyperlipidemic and antiarteriosclerotic drugs, initial encounter [id number]    Please let us know your decision.    Thank you!   Loretha Brasil, PharmD [place] Clinical Pharmacist Direct Dial: [phone]

## 2021-06-20 ENCOUNTER — Ambulatory Visit (INDEPENDENT_AMBULATORY_CARE_PROVIDER_SITE_OTHER): Payer: Medicare Other | Admitting: Family

## 2021-06-20 ENCOUNTER — Other Ambulatory Visit: Payer: Self-pay

## 2021-06-20 VITALS — BP 112/72 | HR 90 | Temp 98.4°F | Resp 20 | Ht 63.0 in | Wt 238.6 lb

## 2021-06-20 DIAGNOSIS — Z6841 Body Mass Index (BMI) 40.0 and over, adult: Secondary | ICD-10-CM

## 2021-06-20 DIAGNOSIS — N3281 Overactive bladder: Secondary | ICD-10-CM

## 2021-06-20 DIAGNOSIS — Z23 Encounter for immunization: Secondary | ICD-10-CM | POA: Diagnosis not present

## 2021-06-20 DIAGNOSIS — Z96652 Presence of left artificial knee joint: Secondary | ICD-10-CM | POA: Diagnosis not present

## 2021-06-20 DIAGNOSIS — G479 Sleep disorder, unspecified: Secondary | ICD-10-CM | POA: Diagnosis not present

## 2021-06-20 DIAGNOSIS — M1712 Unilateral primary osteoarthritis, left knee: Secondary | ICD-10-CM | POA: Diagnosis not present

## 2021-06-20 DIAGNOSIS — I10 Essential (primary) hypertension: Secondary | ICD-10-CM

## 2021-06-20 MED ORDER — OXYBUTYNIN CHLORIDE ER 5 MG PO TB24: 5.0000 mg | ORAL_TABLET | Freq: Every day | ORAL | 1 refills | Status: DC

## 2021-06-20 MED ORDER — TRAZODONE HCL 50 MG PO TABS: 25.0000 mg | ORAL_TABLET | Freq: Every evening | ORAL | 3 refills | Status: DC | PRN

## 2021-06-20 MED ORDER — DULOXETINE HCL 30 MG PO CPEP: 30.0000 mg | ORAL_CAPSULE | Freq: Every day | ORAL | 3 refills | Status: DC

## 2021-06-20 NOTE — Progress Notes (Addendum)
Subjective:    Patient ID: Jacqueline Delgado, female    DOB: [birth year], 70 y.o.   MRN: [id number]  CC: Jacqueline Delgado is a 70 y.o. female who presents today for follow up.   HPI: She feels like she is not emptying bladder.  No hematuria, dysuria, foul odor to urine.    She has seen Dr Matilde Sprang in the past whom prescribed for oxybutynin. Per patient she had a normal bladder scan 03/27/20.   She taking taking cymbalta for left knee pain.Recent left TKR She has noticed  improvement with pain and would like to stay on medication.   She has trouble falling asleep. She has tried melatonin without relief.   Hypertension-compliant with losartan 50 mg, hydrochlorothiazide 25 mg, potassium chloride 20 MEQ daily. No cp, sob.   Compliant with Trulicity 3 mg and pleased with medication. She has lost 60 lbs.     HISTORY:  Past Medical History:  Diagnosis Date  . Anemia    H/O  . Anginal pain ([place])     3/8-12/21/10  . Arthritis    Osteoarthritis in BLE knee  . Colon polyp   . GERD (gastroesophageal reflux disease)   . Hiatal hernia   . History of hiatal hernia   . History of methicillin resistant staphylococcus aureus (MRSA)   . Hypertension    controlled  . Obesity   . Reflux   . Sleep apnea    NO CPAP-SLEEP STUDY FROM 2021 WAS NEGATIVE PER PT  . Small vessel disease [facility])    Past Surgical History:  Procedure Laterality Date  . ABDOMINAL HYSTERECTOMY     partial; per patient has left ovary  . CHOLECYSTECTOMY    . COLONOSCOPY W/ POLYPECTOMY     adenomatous colon polyp  . COLONOSCOPY WITH PROPOFOL N/A 12/10/2017   Procedure: COLONOSCOPY WITH PROPOFOL;  Surgeon: Manya Silvas, MD;  Location: [facility] ENDOSCOPY;  Service: Endoscopy;  Laterality: N/A;  . ESOPHAGOGASTRODUODENOSCOPY (EGD) WITH PROPOFOL N/A 10/11/2015   Procedure: ESOPHAGOGASTRODUODENOSCOPY (EGD) WITH PROPOFOL;  Surgeon: Manya Silvas, MD;  Location: [facility] ENDOSCOPY;  Service: Endoscopy;  Laterality: N/A;  .  ESOPHAGOGASTRODUODENOSCOPY (EGD) WITH PROPOFOL N/A 12/10/2017   Procedure: ESOPHAGOGASTRODUODENOSCOPY (EGD) WITH PROPOFOL;  Surgeon: Manya Silvas, MD;  Location: [facility] ENDOSCOPY;  Service: Endoscopy;  Laterality: N/A;  . JOINT REPLACEMENT Right    knee, Oct 2012  . JOINT REPLACEMENT     hopefully getting left partial knee replacement in July 2016  . SAVORY DILATION N/A 10/11/2015   Procedure: SAVORY DILATION;  Surgeon: Manya Silvas, MD;  Location: [facility] ENDOSCOPY;  Service: Endoscopy;  Laterality: N/A;  . SHOULDER ARTHROSCOPY WITH ROTATOR CUFF REPAIR AND SUBACROMIAL DECOMPRESSION Right 06/14/2015   Procedure: SHOULDER ARTHROSCOPY WITH mini open ROTATOR CUFF REPAIR AND SUBACROMIAL DECOMPRESSION, release long head biceps tendon.;  Surgeon: Leanor Kail, MD;  Location: [facility] ORS;  Service: Orthopedics;  Laterality: Right;  . TOTAL KNEE ARTHROPLASTY Left 03/06/2021   Procedure: TOTAL KNEE ARTHROPLASTY - Rachelle Hora to Assist;  Surgeon: Hessie Knows, MD;  Location: [facility] ORS;  Service: Orthopedics;  Laterality: Left;   Family History  Problem Relation Age of Onset  . Breast cancer Mother 11  . Hyperlipidemia Father   . Heart disease Father   . Stroke Father   . Hypertension Father   . Breast cancer Maternal Grandmother        young  . Alcoholism Other        brother  . Arthritis Other  parent  . Diabetes Other        parent and other family member  . Thyroid cancer Neg Hx     Allergies: Mobic [meloxicam] Current Outpatient Medications on File Prior to Visit  Medication Sig Dispense Refill  . acetaminophen (TYLENOL) 500 MG tablet Take 1-2 tablets (500-1,000 mg total) by mouth every 6 (six) hours as needed for mild pain (pain score 1-3 or temp > 100.5).    Marland Kitchen ACIPHEX 20 MG tablet TAKE 1 TABLET (20 MG TOTAL) BY MOUTH IN THE MORNING AND AT BEDTIME.(DAW 1-BRAND NAME) 180 tablet 1  . fluticasone (FLONASE) 50 MCG/ACT nasal spray Place 2 sprays into both nostrils daily as needed  for allergies or rhinitis. 16 g 3  . lidocaine (LIDODERM) 5 % Place 1 patch onto the skin daily. Remove & Discard patch within 12 hours. 30 patch 1  . losartan (COZAAR) 50 MG tablet TAKE 1 TABLET BY MOUTH EVERY DAY (Patient taking differently: Take 50 mg by mouth daily after lunch.) 90 tablet 1  . ondansetron (ZOFRAN-ODT) 4 MG disintegrating tablet TAKE 1 TABLET BY MOUTH EVERY 8 HOURS AS NEEDED FOR NAUSEA AND VOMITING 30 tablet 1  . TRULICITY 3 0000000 SOPN INJECT 3 MG AS DIRECTED ONCE A WEEK. 15 mL 1  . hydrochlorothiazide (HYDRODIURIL) 25 MG tablet Take 1 tablet (25 mg total) by mouth daily. 90 tablet 3  . oxyCODONE (OXY IR/ROXICODONE) 5 MG immediate release tablet Take 1-2 tablets (5-10 mg total) by mouth every 4 (four) hours as needed for moderate pain (pain score 4-6). (Patient not taking: Reported on 06/20/2021) 30 tablet 0   No current facility-administered medications on file prior to visit.    Social History   Tobacco Use  . Smoking status: Never  . Smokeless tobacco: Never  Vaping Use  . Vaping Use: Never used  Substance Use Topics  . Alcohol use: No  . Drug use: No    Review of Systems  Constitutional:  Negative for chills and fever.  Respiratory:  Negative for cough.   Cardiovascular:  Negative for chest pain and palpitations.  Gastrointestinal:  Negative for nausea and vomiting.  Genitourinary:  Positive for difficulty urinating. Negative for dysuria and frequency.  Psychiatric/Behavioral:  Positive for sleep disturbance.      Objective:    BP 112/72 (BP Location: Left Arm, Patient Position: Sitting, Cuff Size: Large)   Pulse 90   Temp 98.4 F (36.9 C) (Oral)   Resp 20   Ht '5\' 3"'$  (1.6 m)   Wt 238 lb 9.6 oz (108.2 kg)   SpO2 96%   BMI 42.27 kg/m  BP Readings from Last 3 Encounters:  06/20/21 112/72  05/23/21 119/72  04/23/21 118/68   Wt Readings from Last 3 Encounters:  06/20/21 238 lb 9.6 oz (108.2 kg)  05/23/21 242 lb 14.4 oz (110.2 kg)  04/23/21 250 lb  12.8 oz (113.8 kg)    Physical Exam Vitals reviewed.  Constitutional:      Appearance: She is well-developed.  Eyes:     Conjunctiva/sclera: Conjunctivae normal.  Cardiovascular:     Rate and Rhythm: Normal rate and regular rhythm.     Pulses: Normal pulses.     Heart sounds: Normal heart sounds.  Pulmonary:     Effort: Pulmonary effort is normal.     Breath sounds: Normal breath sounds. No wheezing, rhonchi or rales.  Skin:    General: Skin is warm and dry.  Neurological:     Mental Status: She  is alert.  Psychiatric:        Speech: Speech normal.        Behavior: Behavior normal.        Thought Content: Thought content normal.       Assessment & Plan:   Problem List Items Addressed This Visit       Cardiovascular and Mediastinum   Essential hypertension - Primary   Relevant Orders   Basic metabolic panel (Completed)     Musculoskeletal and Integument   Osteoarthritis of knee (Left) (Chronic)   Relevant Medications   DULoxetine (CYMBALTA) 30 MG capsule     Genitourinary   OAB (overactive bladder)    She experiencing urinary hesitancy, no dysuria.  Repeat urinalysis today. She reports normal bladder scan in the past, however I am not able to see this report in detail via epic. Advised to reduce oxybutynin to 5 mg from the current 10 mg dose.  If this does not resolve hesitancy symptoms, advised patient to stop oxybutynin altogether.  If this does not resolve symptoms, would like for her to return back to urology.  She verbalized understanding of all      Relevant Medications   oxybutynin (DITROPAN-XL) 5 MG 24 hr tablet   Other Relevant Orders   Urinalysis, Routine w reflex microscopic (Completed)     Other   BMI 40.0-44.9, adult [facility])    Patient continues to lose weight.  Congratulated her on her hard efforts.  Continue Trulicity 3 mg.      Relevant Orders   Hemoglobin A1c (Completed)   Morbid obesity (HCC)    Chronic, stable.  Continue Trulicity '3mg'$        S/P TKR (total knee replacement) using cement, left    Improved.  Continue Cymbalta 30 mg      Trouble in sleeping    Trial of trazodone.       Relevant Medications   traZODone (DESYREL) 50 MG tablet   Other Visit Diagnoses     Need for immunization against influenza       Relevant Orders   Flu Vaccine QUAD High Dose(Fluad) (Completed)        I have discontinued Liza C. Jacinto's oxybutynin. I am also having her start on DULoxetine, traZODone, and oxybutynin. Additionally, I am having her maintain her losartan, hydrochlorothiazide, oxyCODONE, acetaminophen, lidocaine, fluticasone, Aciphex, ondansetron, and Trulicity.   Meds ordered this encounter  Medications  . DULoxetine (CYMBALTA) 30 MG capsule    Sig: Take 1 capsule (30 mg total) by mouth daily.    Dispense:  90 capsule    Refill:  3    Order Specific Question:   Supervising Provider    Answer:   Deborra Medina L [[id number]]  . traZODone (DESYREL) 50 MG tablet    Sig: Take 0.5-1 tablets (25-50 mg total) by mouth at bedtime as needed for sleep.    Dispense:  30 tablet    Refill:  3    Order Specific Question:   Supervising Provider    Answer:   Deborra Medina L [[id number]]  . oxybutynin (DITROPAN-XL) 5 MG 24 hr tablet    Sig: Take 1 tablet (5 mg total) by mouth at bedtime.    Dispense:  90 tablet    Refill:  1    Order Specific Question:   Supervising Provider    Answer:   Crecencio Mc [[id number]]    Return precautions given.   Risks, benefits, and alternatives of the medications and treatment plan prescribed  today were discussed, and patient expressed understanding.   Education regarding symptom management and diagnosis given to patient on AVS.  Continue to follow with Burnard Hawthorne, FNP for routine health maintenance.   [name] and I agreed with plan.   Mable Paris, FNP

## 2021-06-20 NOTE — Patient Instructions (Addendum)
Trial of oxybutynin 5 mg.  If this does not resolve urinary hesitancy, please stop medication altogether.  If this does not resolve urinary hesitancy, I would like you to return to urology.  Trial of trazodone nightly to help with sleep.  Nice to see you, always!

## 2021-06-20 NOTE — Assessment & Plan Note (Signed)
Improved.  Continue Cymbalta 30 mg

## 2021-06-20 NOTE — Assessment & Plan Note (Signed)
She experiencing urinary hesitancy, no dysuria.  Repeat urinalysis today. She reports normal bladder scan in the past, however I am not able to see this report in detail via epic. Advised to reduce oxybutynin to 5 mg from the current 10 mg dose.  If this does not resolve hesitancy symptoms, advised patient to stop oxybutynin altogether.  If this does not resolve symptoms, would like for her to return back to urology.  She verbalized understanding of all

## 2021-06-20 NOTE — Assessment & Plan Note (Signed)
Trial of trazodone. 

## 2021-06-20 NOTE — Assessment & Plan Note (Signed)
Patient continues to lose weight.  Congratulated her on her hard efforts.  Continue Trulicity 3 mg.

## 2021-06-21 LAB — URINALYSIS, ROUTINE W REFLEX MICROSCOPIC
Bilirubin Urine: NEGATIVE
Hgb urine dipstick: NEGATIVE
Leukocytes,Ua: NEGATIVE
Nitrite: NEGATIVE
RBC / HPF: NONE SEEN (ref 0–?)
Specific Gravity, Urine: 1.025 (ref 1.000–1.030)
Urine Glucose: NEGATIVE
Urobilinogen, UA: 1 (ref 0.0–1.0)
pH: 6 (ref 5.0–8.0)

## 2021-06-21 LAB — BASIC METABOLIC PANEL
BUN: 18 mg/dL (ref 6–23)
CO2: 30 mEq/L (ref 19–32)
Calcium: 10 mg/dL (ref 8.4–10.5)
Chloride: 101 mEq/L (ref 96–112)
Creatinine, Ser: 0.87 mg/dL (ref 0.40–1.20)
GFR: 68.01 mL/min (ref >60.00)
Glucose, Bld: 81 mg/dL (ref 70–99)
Potassium: 3.1 mEq/L — ABNORMAL LOW (ref 3.5–5.1)
Sodium: 140 mEq/L (ref 135–145)

## 2021-06-21 LAB — HEMOGLOBIN A1C: Hgb A1c MFr Bld: 5.8 % (ref 4.6–6.5)

## 2021-06-23 IMAGING — MG DIGITAL DIAGNOSTIC BILAT W/ TOMO W/ CAD
6 of 10 series · 6 of 30 positions shown · non-contrast
Comparison: [DATE] [DATE], [DATE], [DATE] [DATE], [DATE], [DATE] [DATE], [DATE],
[DATE] [DATE], [DATE]

CLINICAL DATA: 68-year-old patient has recently had an episode of
right nipple/retroareolar focal tenderness, with some nipple
retraction, and a lump. Since she first noticed her symptoms, they
have significantly improved, with only mild tenderness remaining at
this time. She had this appointment in the middle of her work day
here at the hospital, and was unable to have the ultrasound
performed, as she needed to return to work, see below. This will be
scheduled for another day.

EXAM:
DIGITAL DIAGNOSTIC BILATERAL MAMMOGRAM WITH CAD AND TOMO

[L CC synth-2D]
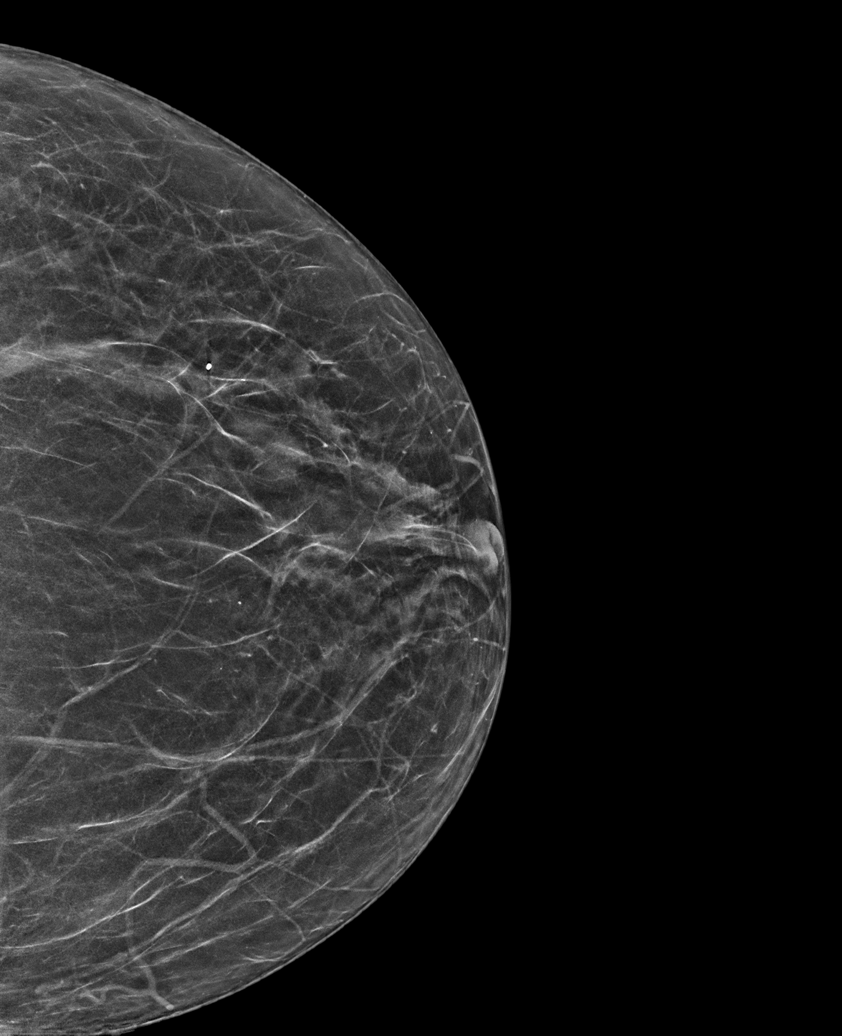

[R CC synth-2D (1 of 2)]
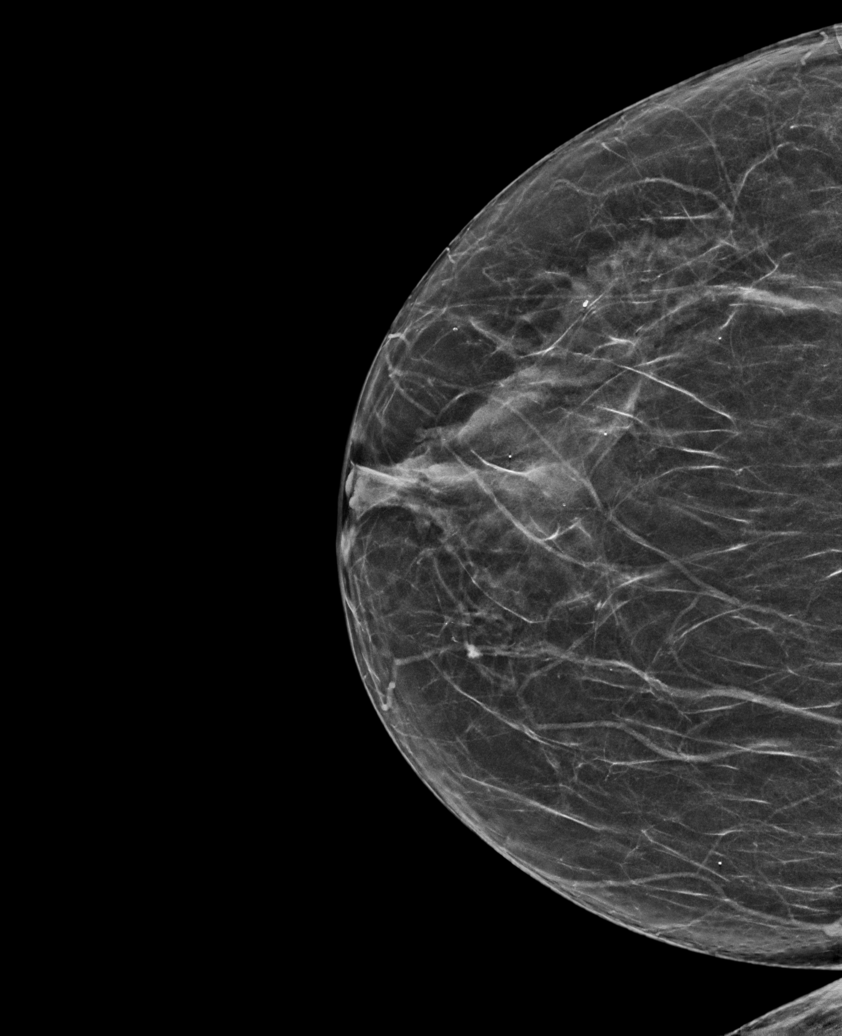

[R CC synth-2D (2 of 2)]
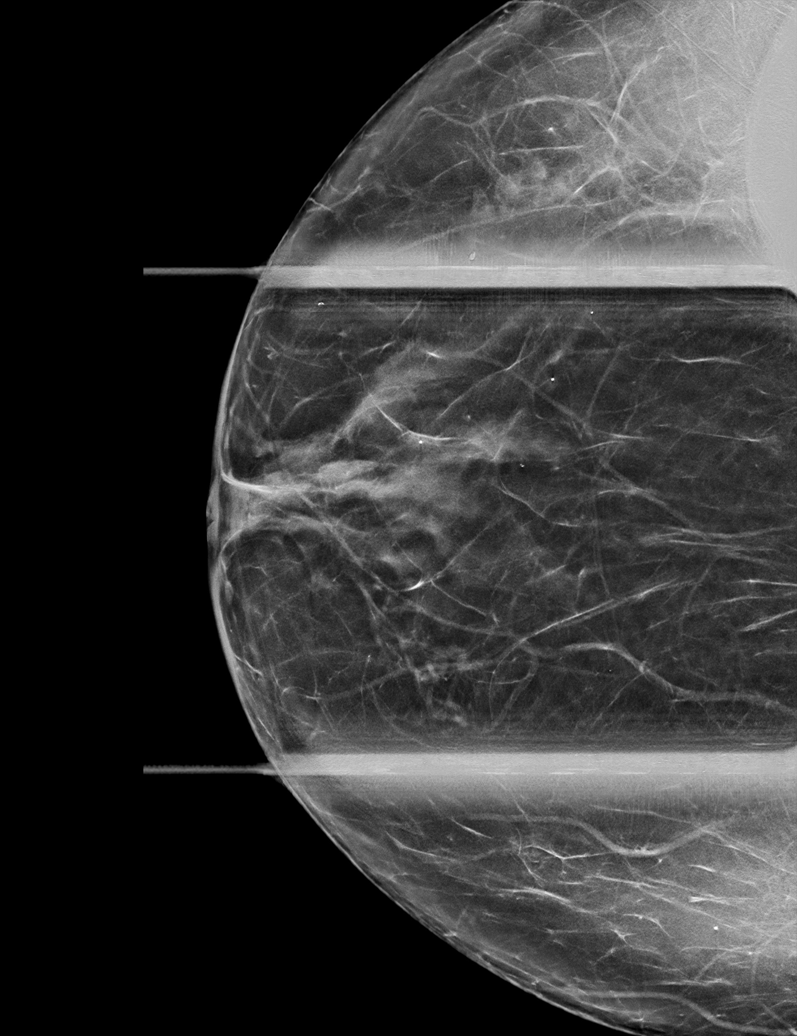

[L MLO synth-2D]
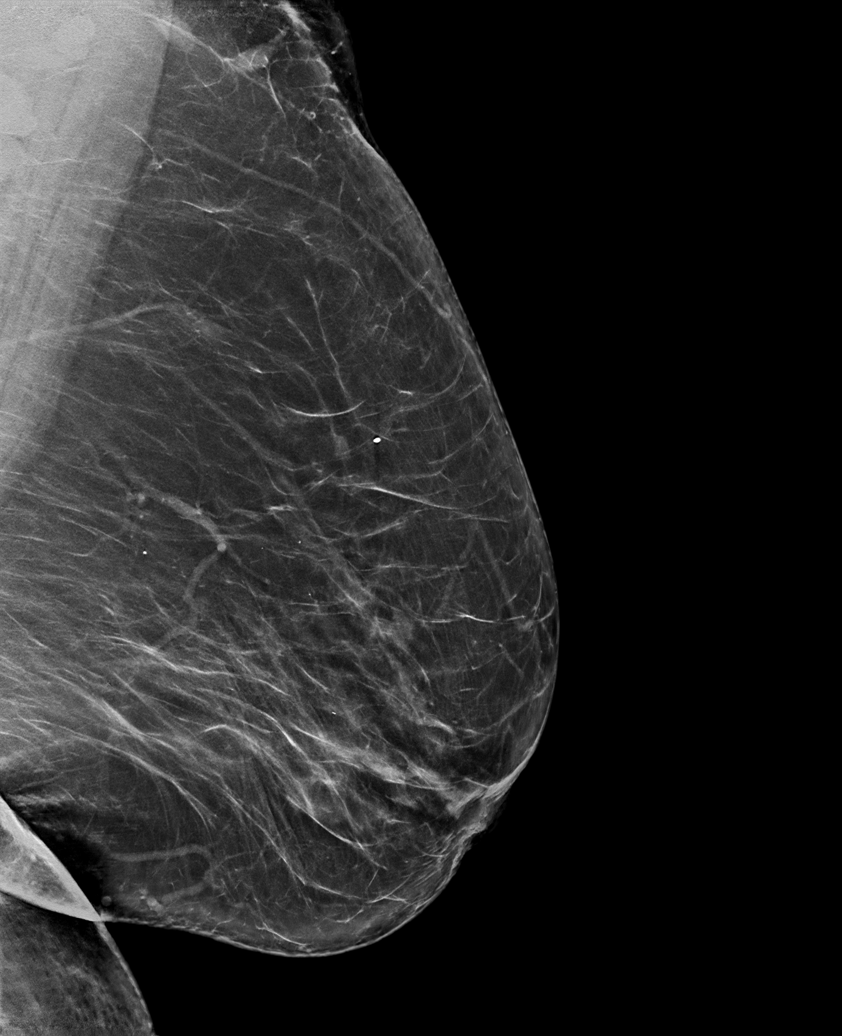

[R MLO synth-2D]
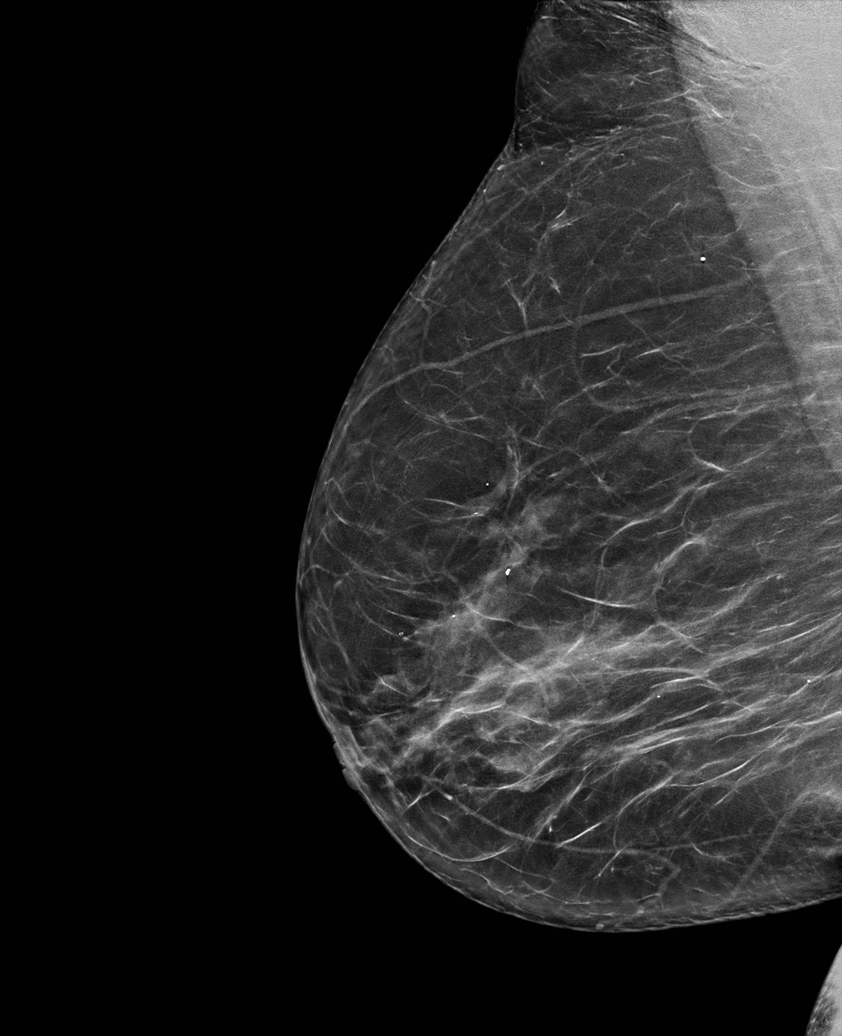

[R CC tomo · tomo slice 28/55.0]
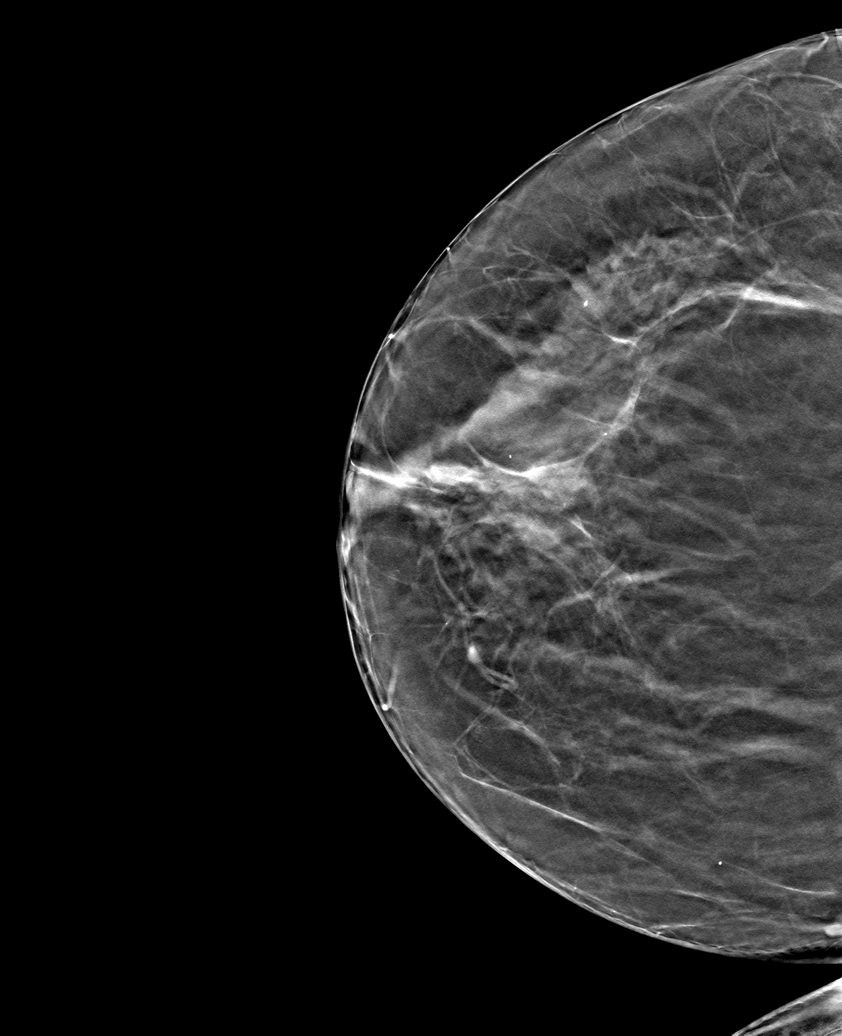

[6 of 30 positions shown; findings below may reference images not displayed]

ACR Breast Density Category b: There are scattered areas of
fibroglandular density.
FINDINGS: The parenchymal pattern of both breasts appear stable.
Mammographically, the appearance of the right nipple appears similar
to prior mammograms. No mass, distortion, suspicious
microcalcification, or skin thickening is seen in either breast to
suggest malignancy.

Mammographic images were processed with CAD.
IMPRESSION: No suspicious findings of either breast on the mammogram. Given
patient's recent symptoms in the retroareolar right breast, right
breast ultrasound is warranted. The patient is unable to stay for
the ultrasound due to time constraints with her work today.

RECOMMENDATION:
Right breast ultrasound is recommended, to be scheduled at the
patient's convenience. At the time of her right breast ultrasound, a
final impression and BI-RADS category will be provided.

I have discussed the findings and recommendations with the patient.
If applicable, a reminder letter will be sent to the patient
regarding the next appointment.

BI-RADS CATEGORY  0: Incomplete. Need additional imaging evaluation
and/or prior mammograms for comparison.

## 2021-06-25 ENCOUNTER — Other Ambulatory Visit: Payer: Self-pay

## 2021-06-25 DIAGNOSIS — I10 Essential (primary) hypertension: Secondary | ICD-10-CM

## 2021-06-25 DIAGNOSIS — R300 Dysuria: Secondary | ICD-10-CM

## 2021-06-25 NOTE — Assessment & Plan Note (Signed)
Chronic, stable.  Continue Trulicity '3mg'$ 

## 2021-06-27 ENCOUNTER — Other Ambulatory Visit: Payer: Medicare Other

## 2021-06-28 ENCOUNTER — Other Ambulatory Visit: Payer: Self-pay

## 2021-06-28 ENCOUNTER — Other Ambulatory Visit (INDEPENDENT_AMBULATORY_CARE_PROVIDER_SITE_OTHER): Payer: Medicare Other

## 2021-06-28 DIAGNOSIS — I10 Essential (primary) hypertension: Secondary | ICD-10-CM

## 2021-06-28 DIAGNOSIS — R300 Dysuria: Secondary | ICD-10-CM

## 2021-06-28 LAB — URINALYSIS, ROUTINE W REFLEX MICROSCOPIC
Bilirubin Urine: NEGATIVE
Hgb urine dipstick: NEGATIVE
Nitrite: NEGATIVE
RBC / HPF: NONE SEEN (ref 0–?)
Specific Gravity, Urine: >=1.03 — AB (ref 1.000–1.030)
Urine Glucose: NEGATIVE
Urobilinogen, UA: 1 (ref 0.0–1.0)
pH: 6 (ref 5.0–8.0)

## 2021-06-28 LAB — BASIC METABOLIC PANEL
BUN: 18 mg/dL (ref 6–23)
CO2: 28 mEq/L (ref 19–32)
Calcium: 9.8 mg/dL (ref 8.4–10.5)
Chloride: 106 mEq/L (ref 96–112)
Creatinine, Ser: 0.79 mg/dL (ref 0.40–1.20)
GFR: 76.34 mL/min (ref >60.00)
Glucose, Bld: 87 mg/dL (ref 70–99)
Potassium: 3.8 mEq/L (ref 3.5–5.1)
Sodium: 142 mEq/L (ref 135–145)

## 2021-06-29 LAB — URINE CULTURE
MICRO NUMBER:: 12379178
SPECIMEN QUALITY:: ADEQUATE

## 2021-07-03 ENCOUNTER — Other Ambulatory Visit: Payer: Self-pay | Admitting: Family

## 2021-07-03 DIAGNOSIS — R8281 Pyuria: Secondary | ICD-10-CM

## 2021-07-13 ENCOUNTER — Other Ambulatory Visit: Payer: Self-pay | Admitting: Family

## 2021-07-13 DIAGNOSIS — I10 Essential (primary) hypertension: Secondary | ICD-10-CM

## 2021-07-13 LAB — HM HEPATITIS C SCREENING LAB: HM Hepatitis Screen: NEGATIVE

## 2021-07-16 ENCOUNTER — Other Ambulatory Visit: Payer: Self-pay | Admitting: Family

## 2021-07-16 DIAGNOSIS — G479 Sleep disorder, unspecified: Secondary | ICD-10-CM

## 2021-07-16 NOTE — Progress Notes (Incomplete)
07/16/21 11:42 AM   Jacqueline Delgado 15-May-1951 [id number]  Referring provider:  Burnard Hawthorne, FNP [address] [place],  [place] [address] No chief complaint on file.    HPI: Jacqueline Delgado is a 70 y.o.female with a history of urinary incontinence who presents for further evaluation of sterile pyuria.   She was previously seen in clinic on 2021 by Dr. Matilde Sprang for her urinary incontinence and discharge.  (Ask Dr. Erlene Quan to elaborate on this note)***      PMH: Past Medical History:  Diagnosis Date   Anemia    H/O   Anginal pain ([place])     3/8-12/21/10   Arthritis    Osteoarthritis in BLE knee   Colon polyp    GERD (gastroesophageal reflux disease)    Hiatal hernia    History of hiatal hernia    History of methicillin resistant staphylococcus aureus (MRSA)    Hypertension    controlled   Obesity    Reflux    Sleep apnea    NO CPAP-SLEEP STUDY FROM 2021 WAS NEGATIVE PER PT   Small vessel disease ([place])     Surgical History: Past Surgical History:  Procedure Laterality Date   ABDOMINAL HYSTERECTOMY     partial; per patient has left ovary   CHOLECYSTECTOMY     COLONOSCOPY W/ POLYPECTOMY     adenomatous colon polyp   COLONOSCOPY WITH PROPOFOL N/A 12/10/2017   Procedure: COLONOSCOPY WITH PROPOFOL;  Surgeon: Manya Silvas, MD;  Location: [facility] ENDOSCOPY;  Service: Endoscopy;  Laterality: N/A;   ESOPHAGOGASTRODUODENOSCOPY (EGD) WITH PROPOFOL N/A 10/11/2015   Procedure: ESOPHAGOGASTRODUODENOSCOPY (EGD) WITH PROPOFOL;  Surgeon: Manya Silvas, MD;  Location: [facility] ENDOSCOPY;  Service: Endoscopy;  Laterality: N/A;   ESOPHAGOGASTRODUODENOSCOPY (EGD) WITH PROPOFOL N/A 12/10/2017   Procedure: ESOPHAGOGASTRODUODENOSCOPY (EGD) WITH PROPOFOL;  Surgeon: Manya Silvas, MD;  Location: [facility] ENDOSCOPY;  Service: Endoscopy;  Laterality: N/A;   JOINT REPLACEMENT Right    knee, Oct 2012   JOINT REPLACEMENT     hopefully getting left partial knee replacement  in July 2016   SAVORY DILATION N/A 10/11/2015   Procedure: SAVORY DILATION;  Surgeon: Manya Silvas, MD;  Location: [facility] ENDOSCOPY;  Service: Endoscopy;  Laterality: N/A;   SHOULDER ARTHROSCOPY WITH ROTATOR CUFF REPAIR AND SUBACROMIAL DECOMPRESSION Right 06/14/2015   Procedure: SHOULDER ARTHROSCOPY WITH mini open ROTATOR CUFF REPAIR AND SUBACROMIAL DECOMPRESSION, release long head biceps tendon.;  Surgeon: Leanor Kail, MD;  Location: [facility] ORS;  Service: Orthopedics;  Laterality: Right;   TOTAL KNEE ARTHROPLASTY Left 03/06/2021   Procedure: TOTAL KNEE ARTHROPLASTY - Rachelle Hora to Assist;  Surgeon: Hessie Knows, MD;  Location: [facility] ORS;  Service: Orthopedics;  Laterality: Left;    Home Medications:  Allergies as of 07/17/2021       Reactions   Mobic [meloxicam]    'stomach upset'         Medication List        Accurate as of July 16, 2021 11:42 AM. If you have any questions, ask your nurse or doctor.          acetaminophen 500 MG tablet Commonly known as: TYLENOL Take 1-2 tablets (500-1,000 mg total) by mouth every 6 (six) hours as needed for mild pain (pain score 1-3 or temp > 100.5).   Aciphex 20 MG tablet Generic drug: RABEprazole TAKE 1 TABLET (20 MG TOTAL) BY MOUTH IN THE MORNING AND AT BEDTIME.(DAW 1-BRAND NAME)   DULoxetine 30 MG capsule  Commonly known as: Cymbalta Take 1 capsule (30 mg total) by mouth daily.   fluticasone 50 MCG/ACT nasal spray Commonly known as: FLONASE Place 2 sprays into both nostrils daily as needed for allergies or rhinitis.   hydrochlorothiazide 25 MG tablet Commonly known as: HYDRODIURIL Take 1 tablet (25 mg total) by mouth daily.   lidocaine 5 % Commonly known as: LIDODERM Place 1 patch onto the skin daily. Remove & Discard patch within 12 hours.   losartan 50 MG tablet Commonly known as: COZAAR TAKE 1 TABLET BY MOUTH EVERY DAY   ondansetron 4 MG disintegrating tablet Commonly known as: ZOFRAN-ODT TAKE 1 TABLET BY  MOUTH EVERY 8 HOURS AS NEEDED FOR NAUSEA AND VOMITING   oxybutynin 5 MG 24 hr tablet Commonly known as: DITROPAN-XL Take 1 tablet (5 mg total) by mouth at bedtime.   oxyCODONE 5 MG immediate release tablet Commonly known as: Oxy IR/ROXICODONE Take 1-2 tablets (5-10 mg total) by mouth every 4 (four) hours as needed for moderate pain (pain score 4-6).   traZODone 50 MG tablet Commonly known as: DESYREL Take 0.5-1 tablets (25-50 mg total) by mouth at bedtime as needed for sleep.   Trulicity 3 RX/4.5OP Sopn Generic drug: Dulaglutide INJECT 3 MG AS DIRECTED ONCE A WEEK.        Allergies:  Allergies  Allergen Reactions   Mobic [Meloxicam]     'stomach upset'     Family History: Family History  Problem Relation Age of Onset   Breast cancer Mother 75   Hyperlipidemia Father    Heart disease Father    Stroke Father    Hypertension Father    Breast cancer Maternal Grandmother        young   Alcoholism Other        brother   Arthritis Other        parent   Diabetes Other        parent and other family member   Thyroid cancer Neg Hx     Social History:  reports that she has never smoked. She has never used smokeless tobacco. She reports that she does not drink alcohol and does not use drugs.   Physical Exam: There were no vitals taken for this visit.  Constitutional:  Alert and oriented, No acute distress. HEENT: [place] AT, moist mucus membranes.  Trachea midline, no masses. Cardiovascular: No clubbing, cyanosis, or edema. Respiratory: Normal respiratory effort, no increased work of breathing. Skin: No rashes, bruises or suspicious lesions. Neurologic: Grossly intact, no focal deficits, moving all 4 extremities. Psychiatric: Normal mood and affect.  Laboratory Data:  Lab Results  Component Value Date   CREATININE 0.79 06/28/2021     Lab Results  Component Value Date   HGBA1C 5.8 06/20/2021    Urinalysis   Pertinent Imaging:    Assessment & Plan:      No follow-ups on file.  I,Kailey Littlejohn,acting as a Education administrator for Hollice Espy, MD.,have documented all relevant documentation on the behalf of Hollice Espy, MD,as directed by  Hollice Espy, MD while in the presence of Hollice Espy, [place] [address] [place], [place] [address] [phone]

## 2021-07-17 ENCOUNTER — Ambulatory Visit: Payer: Medicare Other | Admitting: Urology

## 2021-07-24 DIAGNOSIS — Z03818 Encounter for observation for suspected exposure to other biological agents ruled out: Secondary | ICD-10-CM | POA: Diagnosis not present

## 2021-07-24 DIAGNOSIS — Z20822 Contact with and (suspected) exposure to covid-19: Secondary | ICD-10-CM | POA: Diagnosis not present

## 2021-07-24 NOTE — Progress Notes (Incomplete)
07/24/21 1:13 PM   Jacqueline Delgado 12/02/1950 [id number]  Referring provider:  Burnard Hawthorne, FNP [address] [place],  [place] [address] No chief complaint on file.    HPI: Jacqueline Delgado, who presents today for further evaluation of sterile pyuria.   She was last seen by Dr. Matilde Sprang in 2019 and was treated for OAB with mild bedwetting with Mybetriq 50 mg which showed no improvement. She was then placed on oxybutynin ER 10 mg and Vesicare 5 mg.      PMH: Past Medical History:  Diagnosis Date   Anemia    H/O   Anginal pain ([place])     3/8-12/21/10   Arthritis    Osteoarthritis in BLE knee   Colon polyp    GERD (gastroesophageal reflux disease)    Hiatal hernia    History of hiatal hernia    History of methicillin resistant staphylococcus aureus (MRSA)    Hypertension    controlled   Obesity    Reflux    Sleep apnea    NO CPAP-SLEEP STUDY FROM 2021 WAS NEGATIVE PER PT   Small vessel disease ([place])     Surgical History: Past Surgical History:  Procedure Laterality Date   ABDOMINAL HYSTERECTOMY     partial; per patient has left ovary   CHOLECYSTECTOMY     COLONOSCOPY W/ POLYPECTOMY     adenomatous colon polyp   COLONOSCOPY WITH PROPOFOL N/A 12/10/2017   Procedure: COLONOSCOPY WITH PROPOFOL;  Surgeon: Manya Silvas, MD;  Location[facility] ENDOSCOPY;  Service: Endoscopy;  Laterality: N/A;   ESOPHAGOGASTRODUODENOSCOPY (EGD) WITH PROPOFOL N/A 10/11/2015   Procedure: ESOPHAGOGASTRODUODENOSCOPY (EGD) WITH PROPOFOL;  Surgeon: Manya Silvas, MD;  Location: [facility] ENDOSCOPY;  Service: Endoscopy;  Laterality: N/A;   ESOPHAGOGASTRODUODENOSCOPY (EGD) WITH PROPOFOL N/A 12/10/2017   Procedure: ESOPHAGOGASTRODUODENOSCOPY (EGD) WITH PROPOFOL;  Surgeon: Manya Silvas, MD;  Location: [facility] ENDOSCOPY;  Service: Endoscopy;  Laterality: N/A;   JOINT REPLACEMENT Right    knee, Oct 2012    JOINT REPLACEMENT     hopefully getting left partial knee replacement in July 2016   SAVORY DILATION N/A 10/11/2015   Procedure: SAVORY DILATION;  Surgeon: Manya Silvas, MD;  Location: [facility] ENDOSCOPY;  Service: Endoscopy;  Laterality: N/A;   SHOULDER ARTHROSCOPY WITH ROTATOR CUFF REPAIR AND SUBACROMIAL DECOMPRESSION Right 06/14/2015   Procedure: SHOULDER ARTHROSCOPY WITH mini open ROTATOR CUFF REPAIR AND SUBACROMIAL DECOMPRESSION, release long head biceps tendon.;  Surgeon: Leanor Kail, MD;  Location: [facility] ORS;  Service: Orthopedics;  Laterality: Right;   TOTAL KNEE ARTHROPLASTY Left 03/06/2021   Procedure: TOTAL KNEE ARTHROPLASTY - Rachelle Hora to Assist;  Surgeon: Hessie Knows, MD;  Location: [facility] ORS;  Service: Orthopedics;  Laterality: Left;    Home Medications:  Allergies as of 07/25/2021       Reactions   Mobic [meloxicam]    'stomach upset'         Medication List        Accurate as of July 24, 2021  1:13 PM. If you have any questions, ask your nurse or doctor.          acetaminophen 500 MG tablet Commonly known as: TYLENOL Take 1-2 tablets (500-1,000 mg total) by mouth every 6 (six) hours as needed for mild pain (pain score 1-3 or temp > 100.5).   Aciphex 20 MG tablet Generic drug: RABEprazole TAKE 1 TABLET (20 MG  TOTAL) BY MOUTH IN THE MORNING AND AT BEDTIME.(DAW 1-BRAND NAME)   DULoxetine 30 MG capsule Commonly known as: Cymbalta Take 1 capsule (30 mg total) by mouth daily.   fluticasone 50 MCG/ACT nasal spray Commonly known as: FLONASE Place 2 sprays into both nostrils daily as needed for allergies or rhinitis.   hydrochlorothiazide 25 MG tablet Commonly known as: HYDRODIURIL Take 1 tablet (25 mg total) by mouth daily.   lidocaine 5 % Commonly known as: LIDODERM Place 1 patch onto the skin daily. Remove & Discard patch within 12 hours.   losartan 50 MG tablet Commonly known as: COZAAR TAKE 1 TABLET BY MOUTH EVERY DAY   ondansetron 4 MG  disintegrating tablet Commonly known as: ZOFRAN-ODT TAKE 1 TABLET BY MOUTH EVERY 8 HOURS AS NEEDED FOR NAUSEA AND VOMITING   oxybutynin 5 MG 24 hr tablet Commonly known as: DITROPAN-XL Take 1 tablet (5 mg total) by mouth at bedtime.   oxyCODONE 5 MG immediate release tablet Commonly known as: Oxy IR/ROXICODONE Take 1-2 tablets (5-10 mg total) by mouth every 4 (four) hours as needed for moderate pain (pain score 4-6).   traZODone 50 MG tablet Commonly known as: DESYREL TAKE 0.5-1 TABLETS BY MOUTH AT BEDTIME AS NEEDED FOR SLEEP.   Trulicity 3 RC/7.8LF Sopn Generic drug: Dulaglutide INJECT 3 MG AS DIRECTED ONCE A WEEK.        Allergies:  Allergies  Allergen Reactions   Mobic [Meloxicam]     'stomach upset'     Family History: Family History  Problem Relation Age of Onset   Breast cancer Mother 34   Hyperlipidemia Father    Heart disease Father    Stroke Father    Hypertension Father    Breast cancer Maternal Grandmother        young   Alcoholism Other        brother   Arthritis Other        parent   Diabetes Other        parent and other family member   Thyroid cancer Neg Hx     Social History:  reports that she has never smoked. She has never used smokeless tobacco. She reports that she does not drink alcohol and does not use drugs.   Physical Exam: There were no vitals taken for this visit.  Constitutional:  Alert and oriented, No acute distress. HEENT: [place] AT, moist mucus membranes.  Trachea midline, no masses. Cardiovascular: No clubbing, cyanosis, or edema. Respiratory: Normal respiratory effort, no increased work of breathing. Skin: No rashes, bruises or suspicious lesions. Neurologic: Grossly intact, no focal deficits, moving all 4 extremities. Psychiatric: Normal mood and affect.  Laboratory Data:  Lab Results  Component Value Date   CREATININE 0.79 06/28/2021     Lab Results  Component Value Date   HGBA1C 5.8 06/20/2021     Urinalysis   Pertinent Imaging:    Assessment & Plan:     No follow-ups on file.  I,Jacqueline Delgado,acting as a Education administrator for Hollice Espy, MD.,have documented all relevant documentation on the behalf of Hollice Espy, MD,as directed by  Hollice Espy, MD while in the presence of Hollice Espy, [place] [address] [place], [place] [address] [phone]

## 2021-07-25 ENCOUNTER — Ambulatory Visit: Payer: Medicare Other | Admitting: Urology

## 2021-07-26 ENCOUNTER — Telehealth: Payer: Self-pay | Admitting: Family

## 2021-07-26 DIAGNOSIS — E662 Morbid (severe) obesity with alveolar hypoventilation: Secondary | ICD-10-CM

## 2021-07-26 DIAGNOSIS — Z6841 Body Mass Index (BMI) 40.0 and over, adult: Secondary | ICD-10-CM

## 2021-07-26 NOTE — Telephone Encounter (Signed)
Patient called and said her TRULICITY 3 UX/3.2TF SOPN seems to not be working. She does not feel any different. She asked that Arnett call her.

## 2021-07-27 MED ORDER — TRULICITY 4.5 MG/0.5ML ~~LOC~~ SOAJ
4.5000 mg | SUBCUTANEOUS | 3 refills | Status: DC
Start: 2021-07-27 — End: 2021-09-19

## 2021-07-27 NOTE — Telephone Encounter (Signed)
Call patient As long as she has been on Trulicity 3 mg for at least 4 weeks, she may start a higher dose of Trulicity 4.5 mg taken once weekly.  If no weight loss on Trulicity, please ensure she makes a follow-up appointment with me to discuss further options.  Please schedule follow-up

## 2021-07-27 NOTE — Telephone Encounter (Signed)
I spoke with patient & she has been on the 3 mg dose of Trulicity longer than one month. She will start new dose to see if any benefit on weight loss. She is scheduled to f/u 09/19/21.

## 2021-07-27 NOTE — Addendum Note (Signed)
Addended by: Burnard Hawthorne on: 07/27/2021 01:22 PM   Modules accepted: Orders

## 2021-07-31 NOTE — Progress Notes (Signed)
08/01/21 2:46 PM   Jacqueline Delgado July 07, 1951 [id number]  Referring provider:  Burnard Hawthorne, FNP [address] [place],  [place] [address] Chief Complaint  Patient presents with   Urinary Incontinence     HPI: Jacqueline Delgado is a 70 y.o.female with a personal history of hematuria, vaginal discharge, and urinary incontinence.   She was previously followed by Dr. MaDiarmid. She underwent a cystoscopy on 06/05/2020 that was unremarkable. She was started on Mybetriq 50 mg.  She was given a prescription for vesicare and oxybutynin.   She has had a hysterectomy.   She is not currently taking any OAB medicine. Urine today is unremarkable.   She reports that she used the oxybutynin for a few months but she stopped taking it and has not had much OAB symptoms. She initially presented to urology due to recurrent UTIs that was associated with a tingling sensation. She was put on amoxicillin for her UTIs.   PMH: Past Medical History:  Diagnosis Date   Anemia    H/O   Anginal pain ([place])     3/8-12/21/10   Arthritis    Osteoarthritis in BLE knee   Colon polyp    GERD (gastroesophageal reflux disease)    Hiatal hernia    History of hiatal hernia    History of methicillin resistant staphylococcus aureus (MRSA)    Hypertension    controlled   Obesity    Reflux    Sleep apnea    NO CPAP-SLEEP STUDY FROM 2021 WAS NEGATIVE PER PT   Small vessel disease ([place])     Surgical History: Past Surgical History:  Procedure Laterality Date   ABDOMINAL HYSTERECTOMY     partial; per patient has left ovary   CHOLECYSTECTOMY     COLONOSCOPY W/ POLYPECTOMY     adenomatous colon polyp   COLONOSCOPY WITH PROPOFOL N/A 12/10/2017   Procedure: COLONOSCOPY WITH PROPOFOL;  Surgeon: Manya Silvas, MD;  Location: [facility] ENDOSCOPY;  Service: Endoscopy;  Laterality: N/A;   ESOPHAGOGASTRODUODENOSCOPY (EGD) WITH PROPOFOL N/A 10/11/2015   Procedure: ESOPHAGOGASTRODUODENOSCOPY (EGD) WITH  PROPOFOL;  Surgeon: Manya Silvas, MD;  [facility] ENDOSCOPY;  Service: Endoscopy;  Laterality: N/A;   ESOPHAGOGASTRODUODENOSCOPY (EGD) WITH PROPOFOL N/A 12/10/2017   Procedure: ESOPHAGOGASTRODUODENOSCOPY (EGD) WITH PROPOFOL;  Surgeon: Manya Silvas, MD;  Location[facility] ENDOSCOPY;  Service: Endoscopy;  Laterality: N/A;   JOINT REPLACEMENT Right    knee, Oct 2012   JOINT REPLACEMENT     hopefully getting left partial knee replacement in July 2016   SAVORY DILATION N/A 10/11/2015   Procedure: SAVORY DILATION;  Surgeon: Manya Silvas, MD;  Location: [facility] ENDOSCOPY;  Service: Endoscopy;  Laterality: N/A;   SHOULDER ARTHROSCOPY WITH ROTATOR CUFF REPAIR AND SUBACROMIAL DECOMPRESSION Right 06/14/2015   Procedure: SHOULDER ARTHROSCOPY WITH mini open ROTATOR CUFF REPAIR AND SUBACROMIAL DECOMPRESSION, release long head biceps tendon.;  Surgeon: Leanor Kail, MD;  Location: [facility] ORS;  Service: Orthopedics;  Laterality: Right;   TOTAL KNEE ARTHROPLASTY Left 03/06/2021   Procedure: TOTAL KNEE ARTHROPLASTY - Rachelle Hora to Assist;  Surgeon: Hessie Knows, MD;  Location: [facility] ORS;  Service: Orthopedics;  Laterality: Left;    Home Medications:  Allergies as of 08/01/2021       Reactions   Mobic [meloxicam]    'stomach upset'         Medication List        Accurate as of August 01, 2021  2:46 PM. If you have any  questions, ask your nurse or doctor.          STOP taking these medications    DULoxetine 30 MG capsule Commonly known as: Cymbalta Stopped by: Gaspar Cola, MD   lidocaine 5 % Commonly known as: LIDODERM Stopped by: Gaspar Cola, MD   oxybutynin 5 MG 24 hr tablet Commonly known as: DITROPAN-XL Stopped by: Hollice Espy, MD   oxyCODONE 5 MG immediate release tablet Commonly known as: Oxy IR/ROXICODONE Stopped by: Gaspar Cola, MD       TAKE these medications    acetaminophen 500 MG tablet Commonly known as: TYLENOL Take 1-2  tablets (500-1,000 mg total) by mouth every 6 (six) hours as needed for mild pain (pain score 1-3 or temp > 100.5).   Aciphex 20 MG tablet Generic drug: RABEprazole TAKE 1 TABLET (20 MG TOTAL) BY MOUTH IN THE MORNING AND AT BEDTIME.(DAW 1-BRAND NAME)   diazepam 10 MG tablet Commonly known as: VALIUM Take 1 tablet (10 mg total) by mouth 60 (sixty) minutes before procedure for 1 dose. Take with a sip of water, on an empty stomach. Do not eat anything for 6 hours prior to procedure. Started by: Gaspar Cola, MD   fluticasone 50 MCG/ACT nasal spray Commonly known as: FLONASE Place 2 sprays into both nostrils daily as needed for allergies or rhinitis.   hydrochlorothiazide 25 MG tablet Commonly known as: HYDRODIURIL Take 1 tablet (25 mg total) by mouth daily.   losartan 50 MG tablet Commonly known as: COZAAR TAKE 1 TABLET BY MOUTH EVERY DAY   ondansetron 4 MG disintegrating tablet Commonly known as: ZOFRAN-ODT TAKE 1 TABLET BY MOUTH EVERY 8 HOURS AS NEEDED FOR NAUSEA AND VOMITING   traMADol 50 MG tablet Commonly known as: ULTRAM Take 1 tablet (50 mg total) by mouth 2 (two) times daily as needed for severe pain. Each refill must last 30 days. Started by: Gaspar Cola, MD   traZODone 50 MG tablet Commonly known as: DESYREL TAKE 0.5-1 TABLETS BY MOUTH AT BEDTIME AS NEEDED FOR SLEEP.   Trulicity 4.5 GL/8.7FI Sopn Generic drug: Dulaglutide Inject 4.5 mg as directed once a week.        Allergies:  Allergies  Allergen Reactions   Mobic [Meloxicam]     'stomach upset'     Family History: Family History  Problem Relation Age of Onset   Breast cancer Mother 41   Hyperlipidemia Father    Heart disease Father    Stroke Father    Hypertension Father    Breast cancer Maternal Grandmother        young   Alcoholism Other        brother   Arthritis Other        parent   Diabetes Other        parent and other family member   Thyroid cancer Neg Hx      Social History:  reports that she has never smoked. She has never used smokeless tobacco. She reports that she does not drink alcohol and does not use drugs.   Physical Exam: BP 138/82   Pulse 66   Ht 5\' 3"  (1.6 m)   Wt 239 lb (108.4 kg)   BMI 42.34 kg/m   Constitutional:  Alert and oriented, No acute distress. HEENT: [place] AT, moist mucus membranes.  Trachea midline, no masses. Cardiovascular: No clubbing, cyanosis, or edema. Respiratory: Normal respiratory effort, no increased work of breathing. Skin: No rashes, bruises or suspicious lesions.  Neurologic: Grossly intact, no focal deficits, moving all 4 extremities. Psychiatric: Normal mood and affect.  Laboratory Data:  Lab Results  Component Value Date   CREATININE 0.79 06/28/2021     Lab Results  Component Value Date   HGBA1C 5.8 06/20/2021    Urinalysis Unremarkable today   Pertinent Imaging: Results for orders placed or performed in visit on 08/01/21  Bladder Scan (Post Void Residual) in office  Result Value Ref Range   Scan Result 1     Assessment & Plan:   Pyuria - Urine today was unremarkable she had a cystoscopy last year that was unremarkable. Do not think she needs further evaluation for this  -suspect may be collection technique related  -no microscopic hematuria  2. OAB - she seems to be improving with behavioral modifications and weight loss we discussed avoidance of irritating beverages. She is not interested in any medications    Follow-up as needed  I,Kailey Littlejohn,acting as a scribe for Hollice Espy, MD.,have documented all relevant documentation on the behalf of Hollice Espy, MD,as directed by  Hollice Espy, MD while in the presence of Hollice Espy, MD.  I have reviewed the above documentation for accuracy and completeness, and I agree with the above.   Hollice Espy, MD   [facility] [address], [place], [place] [address] [phone]

## 2021-07-31 NOTE — Progress Notes (Signed)
PROVIDER NOTE: Information contained herein reflects review and annotations entered in association with encounter. Interpretation of such information and data should be left to medically-trained personnel. Information provided to patient can be located elsewhere in the medical record under "Patient Instructions". Document created using STT-dictation technology, any transcriptional errors that may result from process are unintentional.    Patient: Jacqueline Delgado  Service Category: E/M  Provider: Gaspar Cola, MD  DOB: 1951/08/26  DOS: 08/01/2021  Specialty: Interventional Pain Management  MRN: [id number]  Setting: Ambulatory outpatient  PCP: Jacqueline Hawthorne, FNP  Type: Established Patient    Referring Provider: Burnard Hawthorne, FNP  Location: Office  Delivery: Face-to-face     HPI  [name], a 70 y.o. year old female, is here today because of her Chronic bilateral low back pain without sciatica [M54.50, G89.29]. [name] primary complain today is Back Pain (lower) Last encounter: My last encounter with her was on Visit date not found. Pertinent problems: [name] has Plantar fasciitis; Localized swelling of finger of left hand; Acquired trigger finger; Closed nondisplaced fracture of base of fifth metacarpal bone of right hand; Complete tear of right rotator cuff; DDD (degenerative disc disease), lumbar; Chronic low back pain (1ry area of Pain) (Bilateral) (R>L) w/ sciatica (Right); Neck pain; Non-cardiac chest pain; S/P rotator cuff repair; Osteoarthritis, knee; Unspecified osteoarthritis, unspecified site; Osteoarthritis of knee (Left); Ankle swelling, right; Chronic pain syndrome; DDD (degenerative disc disease), lumbosacral; Lumbar facet hypertrophy; Abnormal MRI, lumbar spine (04/12/2013); Lumbosacral central spinal stenosis (9 mm) (L4-5 and L5-S1); Chronic lower extremity pain (Right); Lumbosacral radiculopathy at L5 (Right); Chronic knee pain (3ry area of Pain) (Left);  Swelling of foot (4th area of Pain) (Right); Chronic lower extremity pain (2ry area of Pain) (Bilateral) (R>L); Calcaneal spur of foot (Right); Lumbar Grade 1 Anterolisthesis of L4/L5; Lumbosacral facet syndrome (Bilateral) (R>L); Chronic low back pain (Bilateral) (R>L) w/o sciatica; and Spondylosis without myelopathy or radiculopathy, lumbosacral region on their pertinent problem list. Pain Assessment: Severity of Chronic pain is reported as a 7 /10. Location: Back Left, Right/Denies. Onset: More than a month ago. Quality: Aching, Throbbing, Discomfort, Constant. Timing: Constant. Modifying factor(s): nothing. Vitals:  height is '5\' 3"'  (1.6 m) and weight is 239 lb (108.4 kg). Her temperature is 97.1 F (36.2 C) (abnormal). Her blood pressure is 112/78 and her pulse is 70. Her respiration is 17 and oxygen saturation is 96%.   Reason for encounter: medication management and and increasing her low back pain.   The patient indicates doing well with the current medication regimen. No adverse reactions or side effects reported to the medications.   According to the patient she has been experiencing more pain in the lower back, bilaterally, with the pain going all across and the right side being just as bad as the left.  She denies any pain going down the lower extremities and she did have a recent left total knee replacement.  She describes that there were some issues with getting physical therapy after the replacement and this appears to have led to some pain in the medial aspect of her left knee.  Pharmacotherapy Assessment  Analgesic: Hydrocodone/homatropine solution #120 mL, 5 mL 4 times daily (20 MME/day) MME/day: 20 mg/day   Monitoring: [place] PMP: PDMP reviewed during this encounter.       Pharmacotherapy: No side-effects or adverse reactions reported. Compliance: No problems identified. Effectiveness: Clinically acceptable.  Jacqueline Fischer, RN  08/01/2021  9:41 AM  Sign  when Signing Visit Safety  precautions to be maintained throughout the outpatient stay will include: orient to surroundings, keep bed in low position, maintain call bell within reach at all times, provide assistance with transfer out of bed and ambulation.      UDS:  Summary  Date Value Ref Range Status  05/22/2020 Note  Final    Comment:    ==================================================================== Compliance Drug Analysis, Ur ==================================================================== Test                             Result       Flag       Units  Drug Present and Declared for Prescription Verification   Tramadol                       >3268        EXPECTED   ng/mg creat   O-Desmethyltramadol            2372         EXPECTED   ng/mg creat   N-Desmethyltramadol            1509         EXPECTED   ng/mg creat    Source of tramadol is a prescription medication. O-desmethyltramadol    and N-desmethyltramadol are expected metabolites of tramadol.    Bupropion                      PRESENT      EXPECTED   Hydroxybupropion               PRESENT      EXPECTED    Hydroxybupropion is an expected metabolite of bupropion.  Drug Present not Declared for Prescription Verification   Dextromethorphan               PRESENT      UNEXPECTED   Dextrorphan/Levorphanol        PRESENT      UNEXPECTED    Dextrorphan is an expected metabolite of dextromethorphan, an over-    the-counter or prescription cough suppressant. Dextrorphan cannot be    distinguished from the scheduled prescription medication levorphanol    by the method used for analysis.  ==================================================================== Test                      Result    Flag   Units      Ref Range   Creatinine              153              mg/dL      >=20 ==================================================================== Declared Medications:  The flagging and interpretation on this report are based on the  following declared  medications.  Unexpected results may arise from  inaccuracies in the declared medications.   **Note: The testing scope of this panel includes these medications:   Bupropion (Wellbutrin)  Tramadol (Ultram)   **Note: The testing scope of this panel does not include the  following reported medications:   Famotidine (Pepcid)  Hydrochlorothiazide (Hydrodiuril)  Losartan (Cozaar)  Meloxicam (Mobic)  Mirabegron (Myrbetriq)  Rabeprazole (Aciphex) ==================================================================== For clinical consultation, please call [phone]. ====================================================================      ROS  Constitutional: Denies any fever or chills Gastrointestinal: No reported hemesis, hematochezia, vomiting, or acute GI distress Musculoskeletal: Denies any acute onset joint swelling, redness, loss  of ROM, or weakness Neurological: No reported episodes of acute onset apraxia, aphasia, dysarthria, agnosia, amnesia, paralysis, loss of coordination, or loss of consciousness  Medication Review  Dulaglutide, RABEprazole, acetaminophen, diazepam, fluticasone, hydrochlorothiazide, losartan, ondansetron, traMADol, and traZODone  History Review  Allergy: [name] is allergic to mobic [meloxicam]. Drug: [name]  reports no history of drug use. Alcohol:  reports no history of alcohol use. Tobacco:  reports that she has never smoked. She has never used smokeless tobacco. Social: [name]  reports that she has never smoked. She has never used smokeless tobacco. She reports that she does not drink alcohol and does not use drugs. Medical:  has a past medical history of Anemia, Anginal pain ([place]), Arthritis, Colon polyp, GERD (gastroesophageal reflux disease), Hiatal hernia, History of hiatal hernia, History of methicillin resistant staphylococcus aureus (MRSA), Hypertension, Obesity, Reflux, Sleep apnea, and Small vessel disease ([place]). Surgical: Ms.  Schier  has a past surgical history that includes Joint replacement (Right); Joint replacement; Cholecystectomy; Abdominal hysterectomy; Shoulder arthroscopy with rotator cuff repair and subacromial decompression (Right, 06/14/2015); Esophagogastroduodenoscopy (egd) with propofol (N/A, 10/11/2015); Savory dilation (N/A, 10/11/2015); Colonoscopy w/ polypectomy; Esophagogastroduodenoscopy (egd) with propofol (N/A, 12/10/2017); Colonoscopy with propofol (N/A, 12/10/2017); and Total knee arthroplasty (Left, 03/06/2021). Family: family history includes Alcoholism in an other family member; Arthritis in an other family member; Breast cancer in her maternal grandmother; Breast cancer (age of onset: 18) in her mother; Diabetes in an other family member; Heart disease in her father; Hyperlipidemia in her father; Hypertension in her father; Stroke in her father.  Laboratory Chemistry Profile   Renal Lab Results  Component Value Date   BUN 18 06/28/2021   CREATININE 0.79 06/28/2021   BCR 20 01/19/2021   GFR 76.34 06/28/2021   GFRAA 81 09/13/2020   GFRNONAA >60 03/13/2021    Hepatic Lab Results  Component Value Date   AST 16 02/26/2021   ALT 11 02/26/2021   ALBUMIN 3.7 02/26/2021   ALKPHOS 79 02/26/2021   LIPASE 36 10/12/2017    Electrolytes Lab Results  Component Value Date   NA 142 06/28/2021   K 3.8 06/28/2021   CL 106 06/28/2021   CALCIUM 9.8 06/28/2021   MG 2.0 05/22/2020    Bone Lab Results  Component Value Date   VD25OH 14.17 (L) 09/21/2019   25OHVITD1 18 (L) 05/22/2020   25OHVITD2 13 05/22/2020   25OHVITD3 5.3 05/22/2020    Inflammation (CRP: Acute Phase) (ESR: Chronic Phase) Lab Results  Component Value Date   CRP 15 (H) 05/22/2020   ESRSEDRATE 73 (H) 05/22/2020         Note: Above Lab results reviewed.  Recent Imaging Review  MM 3D SCREEN BREAST BILATERAL CLINICAL DATA:  Screening.  EXAM: DIGITAL SCREENING BILATERAL MAMMOGRAM WITH TOMOSYNTHESIS AND  CAD  TECHNIQUE: Bilateral screening digital craniocaudal and mediolateral oblique mammograms were obtained. Bilateral screening digital breast tomosynthesis was performed. The images were evaluated with computer-aided detection.  COMPARISON:  Previous exam(s).  ACR Breast Density Category b: There are scattered areas of fibroglandular density.  FINDINGS: There are no findings suspicious for malignancy.  IMPRESSION: No mammographic evidence of malignancy. A result letter of this screening mammogram will be mailed directly to the patient.  RECOMMENDATION: Screening mammogram in one year. (Code:SM-B-01Y)  BI-RADS CATEGORY  1: Negative.  Electronically Signed   By: Audie Pinto M.D.   On: 05/14/2021 15:19 Note: Reviewed        Physical Exam  General appearance: Well nourished, well  developed, and well hydrated. In no apparent acute distress Mental status: Alert, oriented x 3 (person, place, & time)       Respiratory: No evidence of acute respiratory distress Eyes: PERLA Vitals: BP 112/78   Pulse 70   Temp (!) 97.1 F (36.2 C)   Resp 17   Ht '5\' 3"'  (1.6 m)   Wt 239 lb (108.4 kg)   SpO2 96%   BMI 42.34 kg/m  BMI: Estimated body mass index is 42.34 kg/m as calculated from the following:   Height as of this encounter: '5\' 3"'  (1.6 m).   Weight as of this encounter: 239 lb (108.4 kg). Ideal: Ideal body weight: 52.4 kg (115 lb 8.3 oz) Adjusted ideal body weight: 74.8 kg (164 lb 14.6 oz)  Assessment   Status Diagnosis  Controlled Controlled Controlled 1. Chronic low back pain (Bilateral) (R>L) w/o sciatica   2. DDD (degenerative disc disease), lumbar   3. Lumbar Grade 1 Anterolisthesis of L4/L5   4. Lumbosacral facet syndrome (Bilateral) (R>L)   5. Lumbar facet hypertrophy   6. DDD (degenerative disc disease), lumbosacral   7. Spondylosis without myelopathy or radiculopathy, lumbosacral region   8. BMI 40.0-44.9, adult ([place])   9. Chronic pain syndrome   10.  Pharmacologic therapy   11. Chronic use of opiate for therapeutic purpose   12. Encounter for chronic pain management   13. Encounter for medication management   14. Chronic knee pain after total replacement of knee joint (Left)   15. Anxiety due to invasive procedure      Updated Problems: Problem  Chronic knee pain after total replacement of knee joint (Left)  Ddd (Degenerative Disc Disease), Cervical    Plan of Care  Problem-specific:  No problem-specific Assessment & Plan notes found for this encounter.  [name] has a current medication list which includes the following long-term medication(s): aciphex, fluticasone, losartan, tramadol, trazodone, and hydrochlorothiazide.  Pharmacotherapy (Medications Ordered): Meds ordered this encounter  Medications   traMADol (ULTRAM) 50 MG tablet    Sig: Take 1 tablet (50 mg total) by mouth 2 (two) times daily as needed for severe pain. Each refill must last 30 days.    Dispense:  60 tablet    Refill:  5    DO NOT: delete (not duplicate); no partial-fill (will deny script to complete), no refill request (F/U required). DISPENSE: 1 day early if closed on fill date. WARN: No CNS-depressants within 8 hrs of med.   diazepam (VALIUM) 10 MG tablet    Sig: Take 1 tablet (10 mg total) by mouth 60 (sixty) minutes before procedure for 1 dose. Take with a sip of water, on an empty stomach. Do not eat anything for 6 hours prior to procedure.    Dispense:  1 tablet    Refill:  0    Must have a driver. Do not drive or operate machinery x 24 hours after taking this medication. Avoid taking within 4 hours of having taken an opioid pain medications.    Orders:  Orders Placed This Encounter  Procedures   LUMBAR FACET(MEDIAL BRANCH NERVE BLOCK) MBNB    Standing Status:   Future    Standing Expiration Date:   11/01/2021    Scheduling Instructions:     Procedure: Lumbar facet block (AKA.: Lumbosacral medial branch nerve block)     Side:  Bilateral     Level: L3-4, L4-5, & L5-S1 Facets (L2, L3, L4, L5, & S1 Medial Branch Nerves)  Sedation: Patient's choice.     Timeframe: ASAA    Order Specific Question:   Where will this procedure be performed?    Answer:   [facility]   Ambulatory referral to Physical Therapy    Referral Priority:   Routine    Referral Type:   Physical Medicine    Referral Reason:   Specialty Services Required    Requested Specialty:   Physical Therapy    Number of Visits Requested:   1   Informed Consent Details: Physician/Practitioner Attestation; Transcribe to consent form and obtain patient signature    Nursing Order: Transcribe to consent form and obtain patient signature. Note: Always confirm laterality of pain with [name], before procedure.    Order Specific Question:   Physician/Practitioner attestation of informed consent for procedure/surgical case    Answer:   I, the physician/practitioner, attest that I have discussed with the patient the benefits, risks, side effects, alternatives, likelihood of achieving goals and potential problems during recovery for the procedure that I have provided informed consent.    Order Specific Question:   Procedure    Answer:   Lumbar Facet Block  under fluoroscopic guidance    Order Specific Question:   Physician/Practitioner performing the procedure    Answer:   Archimedes Harold A. Dossie Arbour MD    Order Specific Question:   Indication/Reason    Answer:   Low Back Pain, with our without leg pain, due to Facet Joint Arthralgia (Joint Pain) Spondylosis (Arthritis of the Spine), without myelopathy or radiculopathy (Nerve Damage).    Follow-up plan:   [facility] for [facility]) procedure: (B) L-FCT Blk, ValiumRx-given.     Interventional Therapies  Risk  Complexity Considerations:   Estimated body mass index is 42.34 kg/m as calculated from the following:   Height as of this encounter: '5\' 3"'  (1.6 m).   Weight as of this encounter: 239 lb (108.4 kg). WNL    Planned  Pending:   Therapeutic/palliative bilateral lumbar facet MBB #3    Under consideration:   Diagnostic right L4-5 LESI #2  Palliative bilateral lumbar facet MBB #3    Completed:   Diagnostic bilateral lumbar facet block x2 (01/11/2021)   Therapeutic  Palliative (PRN) options:   Palliative bilateral lumbar facet MBB #3     Recent Visits No visits were found meeting these conditions. Showing recent visits within past 90 days and meeting all other requirements Today's Visits Date Type Provider Dept  08/01/21 Office Visit Milinda Pointer, MD [facility]  Showing today's visits and meeting all other requirements Future Appointments No visits were found meeting these conditions. Showing future appointments within next 90 days and meeting all other requirements I discussed the assessment and treatment plan with the patient. The patient was provided an opportunity to ask questions and all were answered. The patient agreed with the plan and demonstrated an understanding of the instructions.  Patient advised to call back or seek an in-person evaluation if the symptoms or condition worsens.  Duration of encounter: 35 minutes.  Note by: Jacqueline Cola, MD Date: 08/01/2021; Time: 3:12 PM

## 2021-08-01 ENCOUNTER — Encounter: Payer: Self-pay | Admitting: Urology

## 2021-08-01 ENCOUNTER — Ambulatory Visit (INDEPENDENT_AMBULATORY_CARE_PROVIDER_SITE_OTHER): Payer: Medicare Other | Admitting: Urology

## 2021-08-01 ENCOUNTER — Ambulatory Visit: Payer: Medicare Other | Attending: Pain Medicine | Admitting: Pain Medicine

## 2021-08-01 ENCOUNTER — Encounter: Payer: Self-pay | Admitting: Pain Medicine

## 2021-08-01 ENCOUNTER — Other Ambulatory Visit: Payer: Self-pay

## 2021-08-01 VITALS — BP 112/78 | HR 70 | Temp 97.1°F | Resp 17 | Ht 63.0 in | Wt 239.0 lb

## 2021-08-01 VITALS — BP 138/82 | HR 66 | Ht 63.0 in | Wt 239.0 lb

## 2021-08-01 DIAGNOSIS — Z6841 Body Mass Index (BMI) 40.0 and over, adult: Secondary | ICD-10-CM

## 2021-08-01 DIAGNOSIS — Z79891 Long term (current) use of opiate analgesic: Secondary | ICD-10-CM

## 2021-08-01 DIAGNOSIS — R319 Hematuria, unspecified: Secondary | ICD-10-CM | POA: Diagnosis not present

## 2021-08-01 DIAGNOSIS — Z96652 Presence of left artificial knee joint: Secondary | ICD-10-CM | POA: Diagnosis not present

## 2021-08-01 DIAGNOSIS — M51369 Other intervertebral disc degeneration, lumbar region without mention of lumbar back pain or lower extremity pain: Secondary | ICD-10-CM

## 2021-08-01 DIAGNOSIS — M5137 Other intervertebral disc degeneration, lumbosacral region: Secondary | ICD-10-CM | POA: Insufficient documentation

## 2021-08-01 DIAGNOSIS — G894 Chronic pain syndrome: Secondary | ICD-10-CM

## 2021-08-01 DIAGNOSIS — F419 Anxiety disorder, unspecified: Secondary | ICD-10-CM

## 2021-08-01 DIAGNOSIS — N3946 Mixed incontinence: Secondary | ICD-10-CM | POA: Diagnosis not present

## 2021-08-01 DIAGNOSIS — M47817 Spondylosis without myelopathy or radiculopathy, lumbosacral region: Secondary | ICD-10-CM

## 2021-08-01 DIAGNOSIS — M47816 Spondylosis without myelopathy or radiculopathy, lumbar region: Secondary | ICD-10-CM

## 2021-08-01 DIAGNOSIS — Z79899 Other long term (current) drug therapy: Secondary | ICD-10-CM | POA: Diagnosis not present

## 2021-08-01 DIAGNOSIS — G8929 Other chronic pain: Secondary | ICD-10-CM

## 2021-08-01 DIAGNOSIS — M545 Low back pain, unspecified: Secondary | ICD-10-CM | POA: Diagnosis not present

## 2021-08-01 DIAGNOSIS — M5136 Other intervertebral disc degeneration, lumbar region: Secondary | ICD-10-CM | POA: Insufficient documentation

## 2021-08-01 DIAGNOSIS — M25562 Pain in left knee: Secondary | ICD-10-CM | POA: Insufficient documentation

## 2021-08-01 DIAGNOSIS — M4310 Spondylolisthesis, site unspecified: Secondary | ICD-10-CM

## 2021-08-01 DIAGNOSIS — M51379 Other intervertebral disc degeneration, lumbosacral region without mention of lumbar back pain or lower extremity pain: Secondary | ICD-10-CM

## 2021-08-01 LAB — BLADDER SCAN AMB NON-IMAGING: Scan Result: 1

## 2021-08-01 MED ORDER — DIAZEPAM 10 MG PO TABS
10.0000 mg | ORAL_TABLET | ORAL | 0 refills | Status: DC
Start: 2021-08-01 — End: 2021-09-13

## 2021-08-01 MED ORDER — TRAMADOL HCL 50 MG PO TABS: 50.0000 mg | ORAL_TABLET | Freq: Two times a day (BID) | ORAL | 5 refills | Status: DC | PRN

## 2021-08-01 NOTE — Patient Instructions (Addendum)
______________________________________________________________________________________________  Body mass index (BMI)  Body mass index (BMI) is a common tool for deciding whether a person has an appropriate body weight.  It measures a persons weight in relation to their height.   According to the [facility] of health (NIH): A BMI of less than 18.5 means that a person is underweight. A BMI of between 18.5 and 24.9 is ideal. A BMI of between 25 and 29.9 is overweight. A BMI over 30 indicates obesity.  Weight Management Required  URGENT: Your weight has been found to be adversely affecting your health.  Dear Jacqueline Delgado:  Your current Estimated body mass index is 42.27 kg/m as calculated from the following:   Height as of 06/20/21: _0  (1.6 m).   Weight as of 06/20/21: 238 lb 9.6 oz (108.2 kg).  Please use the table below to identify your weight category and associated incidence of chronic pain, secondary to your weight.  Body Mass Index (BMI) Classification BMI level (kg/m2) Category Associated incidence of chronic pain  <18  Underweight   18.5-24.9 Ideal body weight   25-29.9 Overweight  20%  30-34.9 Obese (Class I)  68%  35-39.9 Severe obesity (Class II)  136%  >40 Extreme obesity (Class III)  254%   In addition: You will be considered "Morbidly Obese", if your BMI is above 30 and you have one or more of the following conditions which are known to be caused and/or directly associated with obesity: 1.    Type 2 Diabetes (Which in turn can lead to cardiovascular diseases (CVD), stroke, peripheral vascular diseases (PVD), retinopathy, nephropathy, and neuropathy) 2.    Cardiovascular Disease (High Blood Pressure; Congestive Heart Failure; High Cholesterol; Coronary Artery Disease; Angina; or History of Heart Attacks) 3.    Breathing problems (Asthma; obesity-hypoventilation syndrome; obstructive sleep apnea; chronic inflammatory airway disease; reactive airway disease; or  shortness of breath) 4.    Chronic kidney disease 5.    Liver disease (nonalcoholic fatty liver disease) 6.    High blood pressure 7.    Acid reflux (gastroesophageal reflux disease; heartburn) 8.    Osteoarthritis (OA) (with any of the following: hip pain; knee pain; and/or low back pain) 9.    Low back pain (Lumbar Facet Syndrome; and/or Degenerative Disc Disease) 10.  Hip pain (Osteoarthritis of hip) (For every 1 lbs of added body weight, there is a 2 lbs increase in pressure inside of each hip articulation. 1:2 mechanical relationship) 11.  Knee pain (Osteoarthritis of knee) (For every 1 lbs of added body weight, there is a 4 lbs increase in pressure inside of each knee articulation. 1:4 mechanical relationship) (patients with a BMI>30 kg/m2 were 6.8 times more likely to develop knee OA than normal-weight individuals) 12.  Cancer: Epidemiological studies have shown that obesity is a risk factor for: post-menopausal breast cancer; cancers of the endometrium, colon and kidney cancer; malignant adenomas of the oesophagus. Obese subjects have an approximately 1.5-3.5-fold increased risk of developing these cancers compared with normal-weight subjects, and it has been estimated that between 15 and 45% of these cancers can be attributed to overweight. More recent studies suggest that obesity may also increase the risk of other types of cancer, including pancreatic, hepatic and gallbladder cancer. (Ref: Obesity and cancer. Pischon T, Nthlings U, [employer] H. Proc Nutr Soc. 2008 May;67(2):128-45. doi: 71.0626/[id number].) The International Agency for Research on Cancer (IARC) has identified 13 cancers associated with overweight and obesity: meningioma, multiple myeloma, adenocarcinoma of the esophagus, and cancers of  the thyroid, postmenopausal breast cancer, gallbladder, stomach, liver, pancreas, kidney, ovaries, uterus, colon and rectal (colorectal) cancers. 30 percent of all cancers diagnosed in women  and 24 percent of those diagnosed in men are associated with overweight and obesity.  Recommendation: At this point it is urgent that you take a step back and concentrate in loosing weight. Dedicate 100% of your efforts on this task. Nothing else will improve your health more than bringing your weight down and your BMI to less than 30. If you are here, you probably have chronic pain. We know that most chronic pain patients have difficulty exercising secondary to their pain. For this reason, you must rely on proper nutrition and diet in order to lose the weight. If your BMI is above 40, you should seriously consider bariatric surgery. A realistic goal is to lose 10% of your body weight over a period of 12 months.  Be honest to yourself, if over time you have unsuccessfully tried to lose weight, then it is time for you to seek professional help and to enter a medically supervised weight management program, and/or undergo bariatric surgery. Stop procrastinating.   Pain management considerations:  1.    Pharmacological Problems: Be advised that the use of opioid analgesics (oxycodone; hydrocodone; morphine; methadone; codeine; and all of their derivatives) have been associated with decreased metabolism and weight gain.  For this reason, should we see that you are unable to lose weight while taking these medications, it may become necessary for [place] to taper down and indefinitely discontinue them.  2.    Technical Problems: The incidence of successful interventional therapies decreases as the patient's BMI increases. It is much more difficult to accomplish a safe and effective interventional therapy on a patient with a BMI above 35. 3.    Radiation Exposure Problems: The x-rays machine, used to accomplish injection therapies, will automatically increase their x-ray output in order to capture an appropriate bone image. This means that radiation exposure increases exponentially with the patient's BMI. (The higher the  BMI, the higher the radiation exposure.) Although the level of radiation used at a given time is still safe to the patient, it is not for the physician and/or assisting staff. Unfortunately, radiation exposure is accumulative. Because physicians and the staff have to do procedures and be exposed on a daily basis, this can result in health problems such as cancer and radiation burns. Radiation exposure to the staff is monitored by the radiation batches that they wear. The exposure levels are reported back to the staff on a quarterly basis. Depending on levels of exposure, physicians and staff may be obligated by law to decrease this exposure. This means that they have the right and obligation to refuse providing therapies where they may be overexposed to radiation. For this reason, physicians may decline to offer therapies such as radiofrequency ablation or implants to patients with a BMI above 40. 4.    Current Trends: Be advised that the current trend is to no longer offer certain therapies to patients with a BMI equal to, or above 35, due to increase perioperative risks, increased technical procedural difficulties, and excessive radiation exposure to healthcare personnel.  ______________________________________________________________________________________________  ____________________________________________________________________________________________  General Risks and Possible Complications  Patient Responsibilities: It is important that you read this as it is part of your informed consent. It is our duty to inform you of the risks and possible complications associated with treatments offered to you. It is your responsibility as a patient to read  this and to ask questions about anything that is not clear or that you believe was not covered in this document.  Patient's Rights: You have the right to refuse treatment. You also have the right to change your mind, even after initially having agreed  to have the treatment done. However, under this last option, if you wait until the last second to change your mind, you may be charged for the materials used up to that point.  Introduction: Medicine is not an Chief Strategy Officer. Everything in Medicine, including the lack of treatment(s), carries the potential for danger, harm, or loss (which is by definition: Risk). In Medicine, a complication is a secondary problem, condition, or disease that can aggravate an already existing one. All treatments carry the risk of possible complications. The fact that a side effects or complications occurs, does not imply that the treatment was conducted incorrectly. It must be clearly understood that these can happen even when everything is done following the highest safety standards.  No treatment: You can choose not to proceed with the proposed treatment alternative. The "PRO(s)" would include: avoiding the risk of complications associated with the therapy. The "CON(s)" would include: not getting any of the treatment benefits. These benefits fall under one of three categories: diagnostic; therapeutic; and/or palliative. Diagnostic benefits include: getting information which can ultimately lead to improvement of the disease or symptom(s). Therapeutic benefits are those associated with the successful treatment of the disease. Finally, palliative benefits are those related to the decrease of the primary symptoms, without necessarily curing the condition (example: decreasing the pain from a flare-up of a chronic condition, such as incurable terminal cancer).  General Risks and Complications: These are associated to most interventional treatments. They can occur alone, or in combination. They fall under one of the following six (6) categories: no benefit or worsening of symptoms; bleeding; infection; nerve damage; allergic reactions; and/or death. No benefits or worsening of symptoms: In Medicine there are no guarantees, only  probabilities. No healthcare provider can ever guarantee that a medical treatment will work, they can only state the probability that it may. Furthermore, there is always the possibility that the condition may worsen, either directly, or indirectly, as a consequence of the treatment. Bleeding: This is more common if the patient is taking a blood thinner, either prescription or over the counter (example: Goody Powders, Fish oil, Aspirin, Garlic, etc.), or if suffering a condition associated with impaired coagulation (example: Hemophilia, cirrhosis of the liver, low platelet counts, etc.). However, even if you do not have one on these, it can still happen. If you have any of these conditions, or take one of these drugs, make sure to notify your treating physician. Infection: This is more common in patients with a compromised immune system, either due to disease (example: diabetes, cancer, human immunodeficiency virus [HIV], etc.), or due to medications or treatments (example: therapies used to treat cancer and rheumatological diseases). However, even if you do not have one on these, it can still happen. If you have any of these conditions, or take one of these drugs, make sure to notify your treating physician. Nerve Damage: This is more common when the treatment is an invasive one, but it can also happen with the use of medications, such as those used in the treatment of cancer. The damage can occur to small secondary nerves, or to large primary ones, such as those in the spinal cord and brain. This damage may be temporary or permanent and it may  lead to impairments that can range from temporary numbness to permanent paralysis and/or brain death. Allergic Reactions: Any time a substance or material comes in contact with our body, there is the possibility of an allergic reaction. These can range from a mild skin rash (contact dermatitis) to a severe systemic reaction (anaphylactic reaction), which can result in  death. Death: In general, any medical intervention can result in death, most of the time due to an unforeseen complication. ____________________________________________________________________________________________    Obesity, Adult Obesity is the condition of having too much total body fat. Being overweight or obese means that your weight is greater than what is considered healthy for your body size. Obesity is determined by a measurement called BMI. BMI is an estimate of body fat and is calculated from height and weight. For adults, a BMI of 30 or higher is considered obese. Obesity can lead to other health concerns and major illnesses, including: Stroke. Coronary artery disease (CAD). Type 2 diabetes. Some types of cancer, including cancers of the colon, breast, uterus, and gallbladder. Osteoarthritis. High blood pressure (hypertension). High cholesterol. Sleep apnea. Gallbladder stones. Infertility problems. What are the causes? Common causes of this condition include: Eating daily meals that are high in calories, sugar, and fat. Being born with genes that may make you more likely to become obese. Having a medical condition that causes obesity, including: Hypothyroidism. Polycystic ovarian syndrome (PCOS). Binge-eating disorder. Cushing syndrome. Taking certain medicines, such as steroids, antidepressants, and seizure medicines. Not being physically active (sedentary lifestyle). Not getting enough sleep. Drinking high amounts of sugar-sweetened beverages, such as soft drinks. What increases the risk? The following factors may make you more likely to develop this condition: Having a family history of obesity. Being a woman of African American descent. Being a man of Hispanic descent. Living in an area with limited access to: Stamps, recreation centers, or sidewalks. Healthy food choices, such as grocery stores and farmers' markets. What are the signs or symptoms? The main  sign of this condition is having too much body fat. How is this diagnosed? This condition is diagnosed based on: Your BMI. If you are an adult with a BMI of 30 or higher, you are considered obese. Your waist circumference. This measures the distance around your waistline. Your skinfold thickness. Your health care provider may gently pinch a fold of your skin and measure it. You may have other tests to check for underlying conditions. How is this treated? Treatment for this condition often includes changing your lifestyle. Treatment may include some or all of the following: Dietary changes. This may include developing a healthy meal plan. Regular physical activity. This may include activity that causes your heart to beat faster (aerobic exercise) and strength training. Work with your health care provider to design an exercise program that works for you. Medicine to help you lose weight if you are unable to lose 1 pound a week after 6 weeks of healthy eating and more physical activity. Treating conditions that cause the obesity (underlying conditions). Surgery. Surgical options may include gastric banding and gastric bypass. Surgery may be done if: Other treatments have not helped to improve your condition. You have a BMI of 40 or higher. You have life-threatening health problems related to obesity. Follow these instructions at home: Eating and drinking  Follow recommendations from your health care provider about what you eat and drink. Your health care provider may advise you to: Limit fast food, sweets, and processed snack foods. Choose low-fat options, such as  low-fat milk instead of whole milk. Eat 5 or more servings of fruits or vegetables every day. Eat at home more often. This gives you more control over what you eat. Choose healthy foods when you eat out. Learn to read food labels. This will help you understand how much food is considered 1 serving. Learn what a healthy serving size  is. Keep low-fat snacks available. Limit sugary drinks, such as soda, fruit juice, sweetened iced tea, and flavored milk. Drink enough water to keep your urine pale yellow. Do not follow a fad diet. Fad diets can be unhealthy and even dangerous. Physical activity Exercise regularly, as told by your health care provider. Most adults should get up to 150 minutes of moderate-intensity exercise every week. Ask your health care provider what types of exercise are safe for you and how often you should exercise. Warm up and stretch before being active. Cool down and stretch after being active. Rest between periods of activity. Lifestyle Work with your health care provider and a dietitian to set a weight-loss goal that is healthy and reasonable for you. Limit your screen time. Find ways to reward yourself that do not involve food. Do not drink alcohol if: Your health care provider tells you not to drink. You are pregnant, may be pregnant, or are planning to become pregnant. If you drink alcohol: Limit how much you use to: 0-1 drink a day for women. 0-2 drinks a day for men. Be aware of how much alcohol is in your drink. In the [place] one drink equals one 12 oz bottle of beer (355 mL), one 5 oz glass of wine (148 mL), or one 1 oz glass of hard liquor (44 mL). General instructions Keep a weight-loss journal to keep track of the food you eat and how much exercise you get. Take over-the-counter and prescription medicines only as told by your health care provider. Take vitamins and supplements only as told by your health care provider. Consider joining a support group. Your health care provider may be able to recommend a support group. Keep all follow-up visits as told by your health care provider. This is important. Contact a health care provider if: You are unable to meet your weight loss goal after 6 weeks of dietary and lifestyle changes. Get help right away if you are having: Trouble  breathing. Suicidal thoughts or behaviors. Summary Obesity is the condition of having too much total body fat. Being overweight or obese means that your weight is greater than what is considered healthy for your body size. Work with your health care provider and a dietitian to set a weight-loss goal that is healthy and reasonable for you. Exercise regularly, as told by your health care provider. Ask your health care provider what types of exercise are safe for you and how often you should exercise. This information is not intended to replace advice given to you by your health care provider. Make sure you discuss any questions you have with your health care provider. Document Revised: 06/04/2018 Document Reviewed: 06/04/2018 Elsevier Patient Education  2022 [employer].  ____________________________________________________________________________________________  Medication Rules  Purpose: To inform patients, and their family members, of our rules and regulations.  Applies to: All patients receiving prescriptions (written or electronic).  Pharmacy of record: Pharmacy where electronic prescriptions will be sent. If written prescriptions are taken to a different pharmacy, please inform the nursing staff. The pharmacy listed in the electronic medical record should be the one where you would like electronic prescriptions  to be sent.  Electronic prescriptions: In compliance with the [place] (STOP) Act of 2017 (Session Lanny Cramp [phone]), effective October 14, 2018, all controlled substances must be electronically prescribed. Calling prescriptions to the pharmacy will cease to exist.  Prescription refills: Only during scheduled appointments. Applies to all prescriptions.  NOTE: The following applies primarily to controlled substances (Opioid* Pain Medications).   Type of encounter (visit): For patients receiving controlled substances, face-to-face visits  are required. (Not an option or up to the patient.)  Patient's responsibilities: Pain Pills: Bring all pain pills to every appointment (except for procedure appointments). Pill Bottles: Bring pills in original pharmacy bottle. Always bring the newest bottle. Bring bottle, even if empty. Medication refills: You are responsible for knowing and keeping track of what medications you take and those you need refilled. The day before your appointment: write a list of all prescriptions that need to be refilled. The day of the appointment: give the list to the admitting nurse. Prescriptions will be written only during appointments. No prescriptions will be written on procedure days. If you forget a medication: it will not be "Called in", "Faxed", or "electronically sent". You will need to get another appointment to get these prescribed. No early refills. Do not call asking to have your prescription filled early. Prescription Accuracy: You are responsible for carefully inspecting your prescriptions before leaving our office. Have the discharge nurse carefully go over each prescription with you, before taking them home. Make sure that your name is accurately spelled, that your address is correct. Check the name and dose of your medication to make sure it is accurate. Check the number of pills, and the written instructions to make sure they are clear and accurate. Make sure that you are given enough medication to last until your next medication refill appointment. Taking Medication: Take medication as prescribed. When it comes to controlled substances, taking less pills or less frequently than prescribed is permitted and encouraged. Never take more pills than instructed. Never take medication more frequently than prescribed.  Inform other Doctors: Always inform, all of your healthcare providers, of all the medications you take. Pain Medication from other Providers: You are not allowed to accept any additional  pain medication from any other Doctor or Healthcare provider. There are two exceptions to this rule. (see below) In the event that you require additional pain medication, you are responsible for notifying us, as stated below. Cough Medicine: Often these contain an opioid, such as codeine or hydrocodone. Never accept or take cough medicine containing these opioids if you are already taking an opioid* medication. The combination may cause respiratory failure and death. Medication Agreement: You are responsible for carefully reading and following our Medication Agreement. This must be signed before receiving any prescriptions from our practice. Safely store a copy of your signed Agreement. Violations to the Agreement will result in no further prescriptions. (Additional copies of our Medication Agreement are available upon request.) Laws, Rules, & Regulations: All patients are expected to follow all Federal and [employer], [employer], Rules, [employer] [facility]. Ignorance of the Laws does not constitute a valid excuse.  Illegal drugs and Controlled Substances: The use of illegal substances (including, but not limited to marijuana and its derivatives) and/or the illegal use of any controlled substances is strictly prohibited. Violation of this rule may result in the immediate and permanent discontinuation of any and all prescriptions being written by our practice. The use of any illegal substances is prohibited. Adopted CDC  guidelines & recommendations: Target dosing levels will be at or below 60 MME/day. Use of benzodiazepines** is not recommended.  Exceptions: There are only two exceptions to the rule of not receiving pain medications from other Healthcare Providers. Exception #1 (Emergencies): In the event of an emergency (i.e.: accident requiring emergency care), you are allowed to receive additional pain medication. However, you are responsible for: As soon as you are able, call our office [phone], at any  time of the day or night, and leave a message stating your name, the date and nature of the emergency, and the name and dose of the medication prescribed. In the event that your call is answered by a member of our staff, make sure to document and save the date, time, and the name of the person that took your information.  Exception #2 (Planned Surgery): In the event that you are scheduled by another doctor or dentist to have any type of surgery or procedure, you are allowed (for a period no longer than 30 days), to receive additional pain medication, for the acute post-op pain. However, in this case, you are responsible for picking up a copy of our "Post-op Pain Management for Surgeons" handout, and giving it to your surgeon or dentist. This document is available at our office, and does not require an appointment to obtain it. Simply go to our office during business hours (Monday-Thursday from 8:00 AM to 4:00 PM) (Friday 8:00 AM to 12:00 Noon) or if you have a scheduled appointment with [place], prior to your surgery, and ask for it by name. In addition, you are responsible for: calling our office [phone], at any time of the day or night, and leaving a message stating your name, name of your surgeon, type of surgery, and date of procedure or surgery. Failure to comply with your responsibilities may result in termination of therapy involving the controlled substances. Medication Agreement Violation. Following the above rules, including your responsibilities will help you in avoiding a Medication Agreement Violation ("Breaking your Pain Medication Contract").  *Opioid medications include: morphine, codeine, oxycodone, oxymorphone, hydrocodone, hydromorphone, meperidine, tramadol, tapentadol, buprenorphine, fentanyl, methadone. **Benzodiazepine medications include: diazepam (Valium), alprazolam (Xanax), clonazepam (Klonopine), lorazepam (Ativan), clorazepate (Tranxene), chlordiazepoxide (Librium), estazolam  (Prosom), oxazepam (Serax), temazepam (Restoril), triazolam (Halcion) (Last updated: 07/11/2021) ____________________________________________________________________________________________  ____________________________________________________________________________________________  Medication Recommendations and Reminders  Applies to: All patients receiving prescriptions (written and/or electronic).  Medication Rules & Regulations: These rules and regulations exist for your safety and that of others. They are not flexible and neither are we. Dismissing or ignoring them will be considered "non-compliance" with medication therapy, resulting in complete and irreversible termination of such therapy. (See document titled "Medication Rules" for more details.) In all conscience, because of safety reasons, we cannot continue providing a therapy where the patient does not follow instructions.  Pharmacy of record:  Definition: This is the pharmacy where your electronic prescriptions will be sent.  We do not endorse any particular pharmacy, however, we have experienced problems with [facility] not securing enough medication supply for the community. We do not restrict you in your choice of pharmacy. However, once we write for your prescriptions, we will NOT be re-sending more prescriptions to fix restricted supply problems created by your pharmacy, or your insurance.  The pharmacy listed in the electronic medical record should be the one where you want electronic prescriptions to be sent. If you choose to change pharmacy, simply notify our nursing staff.  Recommendations: Keep all of your pain medications  in a safe place, under lock and key, even if you live alone. We will NOT replace lost, stolen, or damaged medication. After you fill your prescription, take 1 week's worth of pills and put them away in a safe place. You should keep a separate, properly labeled bottle for this purpose. The remainder should  be kept in the original bottle. Use this as your primary supply, until it runs out. Once it's gone, then you know that you have 1 week's worth of medicine, and it is time to come in for a prescription refill. If you do this correctly, it is unlikely that you will ever run out of medicine. To make sure that the above recommendation works, it is very important that you make sure your medication refill appointments are scheduled at least 1 week before you run out of medicine. To do this in an effective manner, make sure that you do not leave the office without scheduling your next medication management appointment. Always ask the nursing staff to show you in your prescription , when your medication will be running out. Then arrange for the receptionist to get you a return appointment, at least 7 days before you run out of medicine. Do not wait until you have 1 or 2 pills left, to come in. This is very poor planning and does not take into consideration that we may need to cancel appointments due to bad weather, sickness, or emergencies affecting our staff. DO NOT ACCEPT A "Partial Fill": If for any reason your pharmacy does not have enough pills/tablets to completely fill or refill your prescription, do not allow for a "partial fill". The law allows the pharmacy to complete that prescription within 72 hours, without requiring a new prescription. If they do not fill the rest of your prescription within those 72 hours, you will need a separate prescription to fill the remaining amount, which we will NOT provide. If the reason for the partial fill is your insurance, you will need to talk to the pharmacist about payment alternatives for the remaining tablets, but again, DO NOT ACCEPT A PARTIAL FILL, unless you can trust your pharmacist to obtain the remainder of the pills within 72 hours.  Prescription refills and/or changes in medication(s):  Prescription refills, and/or changes in dose or medication, will be  conducted only during scheduled medication management appointments. (Applies to both, written and electronic prescriptions.) No refills on procedure days. No medication will be changed or started on procedure days. No changes, adjustments, and/or refills will be conducted on a procedure day. Doing so will interfere with the diagnostic portion of the procedure. No phone refills. No medications will be "called into the pharmacy". No Fax refills. No weekend refills. No Holliday refills. No after hours refills.  Remember:  Business hours are:  Monday to Thursday 8:00 AM to 4:00 PM Provider's Schedule: Milinda Pointer, MD - Appointments are:  Medication management: Monday and Wednesday 8:00 AM to 4:00 PM Procedure day: Tuesday and Thursday 7:30 AM to 4:00 PM Gillis Santa, MD - Appointments are:  Medication management: Tuesday and Thursday 8:00 AM to 4:00 PM Procedure day: Monday and Wednesday 7:30 AM to 4:00 PM (Last update: 05/03/2020) ____________________________________________________________________________________________  ____________________________________________________________________________________________  CBD (cannabidiol) & Delta-8 (Delta-8 tetrahydrocannabinol) WARNING  Intro: Cannabidiol (CBD) and tetrahydrocannabinol (THC), are two natural compounds found in plants of the Cannabis genus. They can both be extracted from hemp or cannabis. Hemp and cannabis come from the Cannabis sativa plant. Both compounds interact with your body's endocannabinoid  system, but they have very different effects. CBD does not produce the high sensation associated with cannabis. Delta-8 tetrahydrocannabinol, also known as delta-8 THC, is a psychoactive substance found in the Cannabis sativa plant, of which marijuana and hemp are two varieties. THC is responsible for the high associated with the illicit use of marijuana.  Applicable to: All individuals currently taking or considering taking CBD  (cannabidiol) and, more important, all patients taking opioid analgesic controlled substances (pain medication). (Example: oxycodone; oxymorphone; hydrocodone; hydromorphone; morphine; methadone; tramadol; tapentadol; fentanyl; buprenorphine; butorphanol; dextromethorphan; meperidine; codeine; etc.)  Legal status: CBD remains a Schedule I drug prohibited for any use. CBD is illegal with one exception. In the [place], CBD has a limited Transport planner (FDA) approval for the treatment of two specific types of epilepsy disorders. Only one CBD product has been approved by the FDA for this purpose: "Epidiolex". FDA is aware that some companies are marketing products containing cannabis and cannabis-derived compounds in ways that violate the [employer], Drug and Cosmetic Act [facility] Act) and that may put the health and safety of consumers at risk. The FDA, a Federal agency, has not enforced the CBD status since 2018.   Legality: Some manufacturers ship CBD products nationally, which is illegal. Often such products are sold online and are therefore available throughout the country. CBD is openly sold in head shops and health food stores in some states where such sales have not been explicitly legalized. Selling unapproved products with unsubstantiated therapeutic claims is not only a violation of the law, but also can put patients at risk, as these products have not been proven to be safe or effective. Federal illegality makes it difficult to conduct research on CBD.  Reference: "FDA Regulation of Cannabis and Cannabis-Derived Products, Including Cannabidiol (CBD)" - [employer]  Warning: CBD is not FDA approved and has not undergo the same manufacturing controls as prescription drugs.  This means that the purity and safety of available CBD may be questionable. Most of the time,  despite manufacturer's claims, it is contaminated with THC (delta-9-tetrahydrocannabinol - the chemical in marijuana responsible for the "HIGH").  When this is the case, the [facility] contaminant will trigger a positive urine drug screen (UDS) test for Marijuana (carboxy-THC). Because a positive UDS for any illicit substance is a violation of our medication agreement, your opioid analgesics (pain medicine) may be permanently discontinued. The FDA recently put out a warning about 5 things that everyone should be aware of regarding Delta-8 THC: Delta-8 THC products have not been evaluated or approved by the FDA for safe use and may be marketed in ways that put the public health at risk. The FDA has received adverse event reports involving delta-8 THC-containing products. Delta-8 THC has psychoactive and intoxicating effects. Delta-8 THC manufacturing often involve use of potentially harmful chemicals to create the concentrations of delta-8 THC claimed in the marketplace. The final delta-8 THC product may have potentially harmful by-products (contaminants) due to the chemicals used in the process. Manufacturing of delta-8 THC products may occur in uncontrolled or unsanitary settings, which may lead to the presence of unsafe contaminants or other potentially harmful substances. Delta-8 THC products should be kept out of the reach of children and pets.  MORE ABOUT CBD  General Information: CBD was discovered in 20 and it is a derivative of the cannabis sativa genus plants (Marijuana and Hemp). It is one of the 113 identified substances found in Marijuana. It accounts for up to 40% of the  plant's extract. As of 2018, preliminary clinical studies on CBD included research for the treatment of anxiety, movement disorders, and pain. CBD is available and consumed in multiple forms, including inhalation of smoke or vapor, as an aerosol spray, and by mouth. It may be supplied as an oil containing CBD, capsules, dried  cannabis, or as a liquid solution. CBD is thought not to be as psychoactive as THC (delta-9-tetrahydrocannabinol - the chemical in marijuana responsible for the "HIGH"). Studies suggest that CBD may interact with different biological target receptors in the body, including cannabinoid and other neurotransmitter receptors. As of 2018 the mechanism of action for its biological effects has not been determined.  Side-effects  Adverse reactions: Dry mouth, diarrhea, decreased appetite, fatigue, drowsiness, malaise, weakness, sleep disturbances, and others.  Drug interactions: CBC may interact with other medications such as blood-thinners. (Last update: 07/13/2021) ____________________________________________________________________________________________  ______________________________________________________________________  Preparing for Procedure with Oral Anxiolytics  Definition: Anxiolytics - Medications that provide muscle relaxation and decrease anxiety.  Procedure appointments are limited to planned procedures: No Prescription Refills. No disability issues will be discussed. No medication changes will be discussed.  Instructions: Oral Intake: Do not eat or drink anything for at least 6 hours prior to your procedure. (Exception: Blood Pressure Medication. See below.)  Anxiolytic Medication: This medication is meant to relax you during your procedure. DO NOT DRIVE OR OPERATE DANGEROUS MACHINERY WHILE UNDER THE INFLUENCE OF THIS MEDICATION. Take the oral medication (i.e.: Valium) as prescribed on the day of your procedure with just a sip of water. Prescription will be sent to your pharmacy, prior to the day of your procedure. Topical anesthetic: Your physician might have recommended a topical anesthetic/analgesic to apply over the area where the procedure will be done. If so, apply to area 1 hour prior to procedure. For exact location of application, ask your healthcare  provider.    Transportation: A driver is required. You may not drive yourself after the procedure. Blood Pressure Medicine: Do not forget to take your blood pressure medicine with a sip of water the morning of the procedure. If your Diastolic (lower reading) is above 100 mmHg, elective cases will be cancelled/rescheduled. Blood thinners: These will need to be stopped for procedures. Notify our staff if you are taking any blood thinners. Depending on which one you take, there will be specific instructions on how and when to stop it. Diabetics on insulin: Notify the staff so that you can be scheduled 1st case in the morning. If your diabetes requires high dose insulin, take only  of your normal insulin dose the morning of the procedure and notify the staff that you have done so. Preventing infections: Shower with an antibacterial soap the morning of your procedure. Build-up your immune system: Take 1000 mg of Vitamin C with every meal (3 times a day) the day prior to your procedure. Antibiotics: Inform the staff if you have a condition or reason that requires you to take antibiotics before dental procedures. Pregnancy: If you are pregnant, call and cancel the procedure. Sickness: If you have a cold, fever, or any active infections, call and cancel the procedure. Arrival: You must be in the facility at least 30 minutes prior to your scheduled procedure. Children: Do not bring children with you. Dress appropriately: Bring dark clothing that you would not mind if they get stained. Valuables: Do not bring any jewelry or valuables.  Reasons to call and reschedule or cancel your procedure:  NOTE: Following these recommendations will minimize the  risk of a serious complication. Surgeries: Avoid having procedures within 2 weeks of any surgery. (Avoid for 2 weeks before or after any surgery). Flu Shots: Avoid having procedures within 2 weeks of a flu shots. (Avoid for 2 weeks before or after  immunizations). Barium: Avoid having a procedure within 7-10 days after having had a radiological study involving the use of radiological contrast. (Myelograms, Barium swallow or enema study). Heart attacks: Avoid any elective procedures or surgeries for the initial 6 months after a "Myocardial Infarction" (Heart Attack). Blood thinners: It is imperative that you stop these medications before procedures. Let us know if you if you take any blood thinner.  Infection: Avoid procedures during or within two weeks of an infection (including chest colds or gastrointestinal problems). Symptoms associated with infections include: Localized redness, fever, chills, night sweats or profuse sweating, burning sensation when voiding, cough, congestion, stuffiness, runny nose, sore throat, diarrhea, nausea, vomiting, cold or Flu symptoms, recent or current infections. It is especially important if the infection is over the area that we intend to treat. Heart and lung problems: Symptoms that may suggest an active cardiopulmonary problem include: cough, chest pain, breathing difficulties or shortness of breath, dizziness, ankle swelling, uncontrolled high or unusually low blood pressure, and/or palpitations. If you are experiencing any of these symptoms, cancel your procedure and contact your primary care physician for an evaluation.  Remember:  Regular Business hours are:  Monday to Thursday 8:00 AM to 4:00 PM  Provider's Schedule: Milinda Pointer, MD:  Procedure days: Tuesday and Thursday 7:30 AM to 4:00 PM  Gillis Santa, MD:  Procedure days: Monday and Wednesday 7:30 AM to 4:00 PM ______________________________________________________________________

## 2021-08-01 NOTE — Progress Notes (Signed)
Safety precautions to be maintained throughout the outpatient stay will include: orient to surroundings, keep bed in low position, maintain call bell within reach at all times, provide assistance with transfer out of bed and ambulation.  

## 2021-08-02 ENCOUNTER — Telehealth: Payer: Self-pay

## 2021-08-02 LAB — URINALYSIS, COMPLETE
Bilirubin, UA: NEGATIVE
Glucose, UA: NEGATIVE
Ketones, UA: NEGATIVE
Leukocytes,UA: NEGATIVE
Nitrite, UA: NEGATIVE
Protein,UA: NEGATIVE
RBC, UA: NEGATIVE
Specific Gravity, UA: 1.025 (ref 1.005–1.030)
Urobilinogen, Ur: 1 mg/dL (ref 0.2–1.0)
pH, UA: 6 (ref 5.0–7.5)

## 2021-08-02 LAB — MICROSCOPIC EXAMINATION

## 2021-08-02 NOTE — Telephone Encounter (Signed)
Pt want clarification about her PT order she thought it was going to be on her knee and not her back

## 2021-08-02 NOTE — Telephone Encounter (Signed)
PT called and order discussed. Clarification discussed with PT. They are going to treat the left knee as discussed between patient and physician and per order.

## 2021-08-03 ENCOUNTER — Telehealth: Payer: Self-pay | Admitting: Family

## 2021-08-06 ENCOUNTER — Encounter: Payer: Self-pay | Admitting: Family

## 2021-08-06 DIAGNOSIS — R8281 Pyuria: Secondary | ICD-10-CM | POA: Insufficient documentation

## 2021-08-06 NOTE — Telephone Encounter (Signed)
close

## 2021-08-29 NOTE — Progress Notes (Signed)
PROVIDER NOTE: Information contained herein reflects review and annotations entered in association with encounter. Interpretation of such information and data should be left to medically-trained personnel. Information provided to patient can be located elsewhere in the medical record under "Patient Instructions". Document created using STT-dictation technology, any transcriptional errors that may result from process are unintentional.    Patient: Jacqueline Delgado  Service Category: Procedure Provider: Gaspar Cola, MD DOB: December 20, 1950 DOS: 08/30/2021 Location: [facility] MRN: [id number] Setting: Ambulatory - outpatient Referring Provider: Milinda Pointer, MD Type: Established Patient Specialty: Interventional Pain Management PCP: Burnard Hawthorne, FNP  Primary Reason for Visit: Interventional Pain Management Treatment. CC: Back Pain (Lumbar bilateral )   Procedure:          Type: Lumbar Facet, Medial Branch Block(s)          Primary Purpose: Diagnostic/Therapeutic Region: Posterolateral Lumbosacral Spine Level: L2, L3, L4, L5, & S1 Medial Branch Level(s). Injecting these levels blocks the L3-4, L4-5, and L5-S1 lumbar facet joints. Laterality: Bilateral Anesthesia: Local (1-2% Lidocaine)  Anxiolysis: Oral Valium (10 mg) Sedation: None  Guidance: Fluoroscopy          Indications: 1. Lumbosacral facet syndrome (Bilateral) (R>L)   2. Lumbar facet hypertrophy   3. Lumbar Grade 1 Anterolisthesis of L4/L5   4. Spondylosis without myelopathy or radiculopathy, lumbosacral region   5. Chronic low back pain (Bilateral) (R>L) w/o sciatica   6. DDD (degenerative disc disease), lumbosacral   7. BMI 40.0-44.9, adult (HCC)    Pain Score: Pre-procedure: 6 /10 Post-procedure: 0-No pain/10    Position: Prone  Pre-op H&P Assessment:  Jacqueline Delgado is a 70 y.o. (year old), female patient, seen today for interventional treatment. She  has a past surgical history that includes  Joint replacement (Right); Joint replacement; Cholecystectomy; Abdominal hysterectomy; Shoulder arthroscopy with rotator cuff repair and subacromial decompression (Right, 06/14/2015); Esophagogastroduodenoscopy (egd) with propofol (N/A, 10/11/2015); Savory dilation (N/A, 10/11/2015); Colonoscopy w/ polypectomy; Esophagogastroduodenoscopy (egd) with propofol (N/A, 12/10/2017); Colonoscopy with propofol (N/A, 12/10/2017); Total knee arthroplasty (Left, 03/06/2021); and Joint replacement (Left). Jacqueline Delgado has a current medication list which includes the following prescription(s): aciphex, diazepam, trulicity, hydrochlorothiazide, losartan, ondansetron, and tramadol. Her primarily concern today is the Back Pain (Lumbar bilateral )  Initial Vital Signs:  Pulse/HCG Rate: 96  Temp: (!) 97.2 F (36.2 C) Resp: 16 BP: 119/83 SpO2: 100 %  BMI: Estimated body mass index is 42.34 kg/m as calculated from the following:   Height as of this encounter: 5\' 3"  (1.6 m).   Weight as of this encounter: 239 lb (108.4 kg).  Risk Assessment: Allergies: Reviewed. She is allergic to mobic [meloxicam].  Allergy Precautions: None required Coagulopathies: Reviewed. None identified.  Blood-thinner therapy: None at this time Active Infection(s): Reviewed. None identified. Jacqueline Delgado is afebrile  Site Confirmation: Jacqueline Delgado was asked to confirm the procedure and laterality before marking the site Procedure checklist: Completed Consent: Before the procedure and under the influence of no sedative(s), amnesic(s), or anxiolytics, the patient was informed of the treatment options, risks and possible complications. To fulfill our ethical and legal obligations, as recommended by the American Medical Association's Code of Ethics, I have informed the patient of my clinical impression; the nature and purpose of the treatment or procedure; the risks, benefits, and possible complications of the intervention; the alternatives,  including doing nothing; the risk(s) and benefit(s) of the alternative treatment(s) or procedure(s); and the risk(s) and benefit(s) of doing nothing. The patient was provided  information about the general risks and possible complications associated with the procedure. These may include, but are not limited to: failure to achieve desired goals, infection, bleeding, organ or nerve damage, allergic reactions, paralysis, and death. In addition, the patient was informed of those risks and complications associated to Spine-related procedures, such as failure to decrease pain; infection (i.e.: Meningitis, epidural or intraspinal abscess); bleeding (i.e.: epidural hematoma, subarachnoid hemorrhage, or any other type of intraspinal or peri-dural bleeding); organ or nerve damage (i.e.: Any type of peripheral nerve, nerve root, or spinal cord injury) with subsequent damage to sensory, motor, and/or autonomic systems, resulting in permanent pain, numbness, and/or weakness of one or several areas of the body; allergic reactions; (i.e.: anaphylactic reaction); and/or death. Furthermore, the patient was informed of those risks and complications associated with the medications. These include, but are not limited to: allergic reactions (i.e.: anaphylactic or anaphylactoid reaction(s)); adrenal axis suppression; blood sugar elevation that in diabetics may result in ketoacidosis or comma; water retention that in patients with history of congestive heart failure may result in shortness of breath, pulmonary edema, and decompensation with resultant heart failure; weight gain; swelling or edema; medication-induced neural toxicity; particulate matter embolism and blood vessel occlusion with resultant organ, and/or nervous system infarction; and/or aseptic necrosis of one or more joints. Finally, the patient was informed that Medicine is not an exact science; therefore, there is also the possibility of unforeseen or unpredictable risks  and/or possible complications that may result in a catastrophic outcome. The patient indicated having understood very clearly. We have given the patient no guarantees and we have made no promises. Enough time was given to the patient to ask questions, all of which were answered to the patient's satisfaction. Ms. Mcelveen has indicated that she wanted to continue with the procedure. Attestation: I, the ordering provider, attest that I have discussed with the patient the benefits, risks, side-effects, alternatives, likelihood of achieving goals, and potential problems during recovery for the procedure that I have provided informed consent. Date  Time: 08/30/2021 11:34 AM  Pre-Procedure Preparation:  Monitoring: As per clinic protocol. Respiration, ETCO2, SpO2, BP, heart rate and rhythm monitor placed and checked for adequate function Safety Precautions: Patient was assessed for positional comfort and pressure points before starting the procedure. Time-out: I initiated and conducted the "Time-out" before starting the procedure, as per protocol. The patient was asked to participate by confirming the accuracy of the "Time Out" information. Verification of the correct person, site, and procedure were performed and confirmed by me, the nursing staff, and the patient. "Time-out" conducted as per Joint Commission's Universal Protocol (UP.01.01.01). Time: 1203  Description of Procedure:          Laterality: Bilateral. The procedure was performed in identical fashion on both sides. Levels:  L2, L3, L4, L5, & S1 Medial Branch Level(s) Area Prepped: Posterior Lumbosacral Region DuraPrep (Iodine Povacrylex [0.7% available iodine] and Isopropyl Alcohol, 74% w/w) Safety Precautions: Aspiration looking for blood return was conducted prior to all injections. At no point did we inject any substances, as a needle was being advanced. Before injecting, the patient was told to immediately notify me if she was experiencing  any new onset of "ringing in the ears, or metallic taste in the mouth". No attempts were made at seeking any paresthesias. Safe injection practices and needle disposal techniques used. Medications properly checked for expiration dates. SDV (single dose vial) medications used. After the completion of the procedure, all disposable equipment used was discarded in the  proper designated Insurance risk surveyor. Local Anesthesia: Protocol guidelines were followed. The patient was positioned over the fluoroscopy table. The area was prepped in the usual manner. The time-out was completed. The target area was identified using fluoroscopy. A 12-in long, straight, sterile hemostat was used with fluoroscopic guidance to locate the targets for each level blocked. Once located, the skin was marked with an approved surgical skin marker. Once all sites were marked, the skin (epidermis, dermis, and hypodermis), as well as deeper tissues (fat, connective tissue and muscle) were infiltrated with a small amount of a short-acting local anesthetic, loaded on a 10cc syringe with a 25G, 1.5-in  Needle. An appropriate amount of time was allowed for local anesthetics to take effect before proceeding to the next step. Local Anesthetic: Lidocaine 2.0% The unused portion of the local anesthetic was discarded in the proper designated containers. Technical explanation of process:  L2 Medial Branch Nerve Block (MBB): The target area for the L2 medial branch is at the junction of the postero-lateral aspect of the superior articular process and the superior, posterior, and medial edge of the transverse process of L3. Under fluoroscopic guidance, a Quincke needle was inserted until contact was made with os over the superior postero-lateral aspect of the pedicular shadow (target area). After negative aspiration for blood, 0.5 mL of the nerve block solution was injected without difficulty or complication. The needle was removed intact. L3 Medial  Branch Nerve Block (MBB): The target area for the L3 medial branch is at the junction of the postero-lateral aspect of the superior articular process and the superior, posterior, and medial edge of the transverse process of L4. Under fluoroscopic guidance, a Quincke needle was inserted until contact was made with os over the superior postero-lateral aspect of the pedicular shadow (target area). After negative aspiration for blood, 0.5 mL of the nerve block solution was injected without difficulty or complication. The needle was removed intact. L4 Medial Branch Nerve Block (MBB): The target area for the L4 medial branch is at the junction of the postero-lateral aspect of the superior articular process and the superior, posterior, and medial edge of the transverse process of L5. Under fluoroscopic guidance, a Quincke needle was inserted until contact was made with os over the superior postero-lateral aspect of the pedicular shadow (target area). After negative aspiration for blood, 0.5 mL of the nerve block solution was injected without difficulty or complication. The needle was removed intact. L5 Medial Branch Nerve Block (MBB): The target area for the L5 medial branch is at the junction of the postero-lateral aspect of the superior articular process and the superior, posterior, and medial edge of the sacral ala. Under fluoroscopic guidance, a Quincke needle was inserted until contact was made with os over the superior postero-lateral aspect of the pedicular shadow (target area). After negative aspiration for blood, 0.5 mL of the nerve block solution was injected without difficulty or complication. The needle was removed intact. S1 Medial Branch Nerve Block (MBB): The target area for the S1 medial branch is at the posterior and inferior 6 o'clock position of the L5-S1 facet joint. Under fluoroscopic guidance, the Quincke needle inserted for the L5 MBB was redirected until contact was made with os over the inferior  and postero aspect of the sacrum, at the 6 o' clock position under the L5-S1 facet joint (Target area). After negative aspiration for blood, 0.5 mL of the nerve block solution was injected without difficulty or complication. The needle was removed intact.  Note: At the S1 level on the right side the patient did experience a paresthesia as the needle was being placed.  Because of this, we did not inject until we move the needle to a different location.  Once we corrected placement more superior and lateral, we then completed the procedure without any further paresthesias or discomfort on injection.  The patient was reminded that this is the precise reason why all nerve blocks are done without putting the patient is to sleep.  Nerve block solution: 0.2% PF-Ropivacaine + Triamcinolone (40 mg/mL) diluted to a final concentration of 4 mg of Triamcinolone/mL of Ropivacaine The unused portion of the solution was discarded in the proper designated containers. Procedural Needles: 22-gauge, 5-inch, Quincke needles used for all levels.  Once the entire procedure was completed, the treated area was cleaned, making sure to leave some of the prepping solution back to take advantage of its long term bactericidal properties.      Illustration of the posterior view of the lumbar spine and the posterior neural structures. Laminae of L2 through S1 are labeled. DPRL5, dorsal primary ramus of L5; DPRS1, dorsal primary ramus of S1; DPR3, dorsal primary ramus of L3; FJ, facet (zygapophyseal) joint L3-L4; I, inferior articular process of L4; LB1, lateral branch of dorsal primary ramus of L1; IAB, inferior articular branches from L3 medial branch (supplies L4-L5 facet joint); IBP, intermediate branch plexus; MB3, medial branch of dorsal primary ramus of L3; NR3, third lumbar nerve root; S, superior articular process of L5; SAB, superior articular branches from L4 (supplies L4-5 facet joint also); TP3, transverse process of  L3.  Vitals:   08/30/21 1205 08/30/21 1210 08/30/21 1215 08/30/21 1220  BP: (!) 143/97 (!) 132/96 (!) 140/92 134/90  Pulse: 91 85 84 82  Resp: 17 14 16 17   Temp:      TempSrc:      SpO2: 99% 99% 100% 100%  Weight:      Height:         Start Time: 1203 hrs. End Time: 1216 hrs.  Imaging Guidance (Spinal):          Type of Imaging Technique: Fluoroscopy Guidance (Spinal) Indication(s): Assistance in needle guidance and placement for procedures requiring needle placement in or near specific anatomical locations not easily accessible without such assistance. Exposure Time: Please see nurses notes. Contrast: None used. Fluoroscopic Guidance: I was personally present during the use of fluoroscopy. "Tunnel Vision Technique" used to obtain the best possible view of the target area. Parallax error corrected before commencing the procedure. "Direction-depth-direction" technique used to introduce the needle under continuous pulsed fluoroscopy. Once target was reached, antero-posterior, oblique, and lateral fluoroscopic projection used confirm needle placement in all planes. Images permanently stored in EMR. Interpretation: No contrast injected. I personally interpreted the imaging intraoperatively. Adequate needle placement confirmed in multiple planes. Permanent images saved into the patient's record.  Antibiotic Prophylaxis:   Anti-infectives (From admission, onward)    None      Indication(s): None identified  Post-operative Assessment:  Post-procedure Vital Signs:  Pulse/HCG Rate: 82 (NSR)  Temp: (!) 97.2 F (36.2 C) Resp: 17 BP: 134/90 SpO2: 100 %  EBL: None  Complications: No immediate post-treatment complications observed by team, or reported by patient.  Note: The patient tolerated the entire procedure well. A repeat set of vitals were taken after the procedure and the patient was kept under observation following institutional policy, for this type of procedure.  Post-procedural neurological assessment was performed, showing return to  baseline, prior to discharge. The patient was provided with post-procedure discharge instructions, including a section on how to identify potential problems. Should any problems arise concerning this procedure, the patient was given instructions to immediately contact us, at any time, without hesitation. In any case, we plan to contact the patient by telephone for a follow-up status report regarding this interventional procedure.  Comments:  No additional relevant information.  Plan of Care  Orders:  Orders Placed This Encounter  Procedures   LUMBAR FACET(MEDIAL BRANCH NERVE BLOCK) MBNB    Scheduling Instructions:     Procedure: Lumbar facet block (AKA.: Lumbosacral medial branch nerve block)     Side: Bilateral     Level: L3-4, L4-5, & L5-S1 Facets (L2, L3, L4, L5, & S1 Medial Branch Nerves)     Sedation: Patient's choice.     Timeframe: Today    Order Specific Question:   Where will this procedure be performed?    Answer:   [facility]   DG PA[facility] C-ARM 1-60 MIN NO REPORT    Intraoperative interpretation by procedural physician at [facility].    Standing Status:   Standing    Number of Occurrences:   1    Order Specific Question:   Reason for exam:    Answer:   Assistance in needle guidance and placement for procedures requiring needle placement in or near specific anatomical locations not easily accessible without such assistance.   Informed Consent Details: Physician/Practitioner Attestation; Transcribe to consent form and obtain patient signature    Nursing Order: Transcribe to consent form and obtain patient signature. Note: Always confirm laterality of pain with Ms. Bougher, before procedure.    Order Specific Question:   Physician/Practitioner attestation of informed consent for procedure/surgical case    Answer:   I, the physician/practitioner, attest that I have discussed with the  patient the benefits, risks, side effects, alternatives, likelihood of achieving goals and potential problems during recovery for the procedure that I have provided informed consent.    Order Specific Question:   Procedure    Answer:   Lumbar Facet Block  under fluoroscopic guidance    Order Specific Question:   Physician/Practitioner performing the procedure    Answer:   Yetzali Weld A. Dossie Arbour MD    Order Specific Question:   Indication/Reason    Answer:   Low Back Pain, with our without leg pain, due to Facet Joint Arthralgia (Joint Pain) Spondylosis (Arthritis of the Spine), without myelopathy or radiculopathy (Nerve Damage).   Provide equipment / supplies at bedside    "Block Tray" (Disposable  single use) Needle type: SpinalSpinal Amount/quantity: 4 Size: Long (7-inch) Gauge: 22G    Standing Status:   Standing    Number of Occurrences:   1    Order Specific Question:   Specify    Answer:   Block Tray    Chronic Opioid Analgesic:  Hydrocodone/homatropine solution #120 mL, 5 mL 4 times daily (20 MME/day) MME/day: 20 mg/day   Medications ordered for procedure: Meds ordered this encounter  Medications   lidocaine (XYLOCAINE) 2 % (with pres) injection 400 mg   pentafluoroprop-tetrafluoroeth (GEBAUERS) aerosol   ropivacaine (PF) 2 mg/mL (0.2%) (NAROPIN) injection 18 mL   triamcinolone acetonide (KENALOG-40) injection 80 mg    Medications administered: We administered lidocaine, pentafluoroprop-tetrafluoroeth, ropivacaine (PF) 2 mg/mL (0.2%), and triamcinolone acetonide.  See the medical record for exact dosing, route, and time of administration.  Follow-up plan:   Return in about 2 weeks (  around 09/13/2021) for Proc-day (T,Th), (VV), (PPE).       Interventional Therapies  Risk  Complexity Considerations:   Estimated body mass index is 42.34 kg/m as calculated from the following:   Height as of this encounter: 5\' 3"  (1.6 m).   Weight as of this encounter: 239 lb (108.4  kg). WNL   Planned  Pending:      Under consideration:      Completed:   Therapeutic/palliative bilateral lumbar facet MBB x3 (08/30/2021)  Therapeutic right L4-5 LESI x1 (07/27/2020)    Therapeutic  Palliative (PRN) options:   Palliative bilateral lumbar facet MBB #4  Therapeutic right L4-5 LESI #2     Recent Visits Date Type Provider Dept  08/01/21 Office Visit Milinda Pointer, MD [facility]  Showing recent visits within past 90 days and meeting all other requirements Today's Visits Date Type Provider Dept  08/30/21 Procedure visit Milinda Pointer, MD [facility]  Showing today's visits and meeting all other requirements Future Appointments Date Type Provider Dept  09/13/21 Appointment Milinda Pointer, MD [facility]  Showing future appointments within next 90 days and meeting all other requirements Disposition: Discharge home  Discharge (Date  Time): 08/30/2021; 1223 hrs.   Primary Care Physician: Burnard Hawthorne, FNP Location: [facility] Note by: Gaspar Cola, MD Date: 08/30/2021; Time: 12:38 PM  Disclaimer:  Medicine is not an exact science. The only guarantee in medicine is that nothing is guaranteed. It is important to note that the decision to proceed with this intervention was based on the information collected from the patient. The Data and conclusions were drawn from the patient's questionnaire, the interview, and the physical examination. Because the information was provided in large part by the patient, it cannot be guaranteed that it has not been purposely or unconsciously manipulated. Every effort has been made to obtain as much relevant data as possible for this evaluation. It is important to note that the conclusions that lead to this procedure are derived in large part from the available data. Always take into account that the treatment will also be dependent on availability of  resources and existing treatment guidelines, considered by other Pain Management Practitioners as being common knowledge and practice, at the time of the intervention. For Medico-Legal purposes, it is also important to point out that variation in procedural techniques and pharmacological choices are the acceptable norm. The indications, contraindications, technique, and results of the above procedure should only be interpreted and judged by a Board-Certified Interventional Pain Specialist with extensive familiarity and expertise in the same exact procedure and technique.

## 2021-08-30 ENCOUNTER — Encounter: Payer: Self-pay | Admitting: Pain Medicine

## 2021-08-30 ENCOUNTER — Other Ambulatory Visit: Payer: Self-pay

## 2021-08-30 ENCOUNTER — Ambulatory Visit
Admission: RE | Admit: 2021-08-30 | Discharge: 2021-08-30 | Disposition: A | Discharge status: Home / Self Care | Payer: Medicare Other | Source: Ambulatory Visit | Attending: Pain Medicine | Admitting: Pain Medicine

## 2021-08-30 ENCOUNTER — Ambulatory Visit (HOSPITAL_BASED_OUTPATIENT_CLINIC_OR_DEPARTMENT_OTHER): Payer: Medicare Other | Admitting: Pain Medicine

## 2021-08-30 VITALS — BP 134/90 | HR 82 | Temp 97.2°F | Resp 17 | Ht 63.0 in | Wt 239.0 lb

## 2021-08-30 DIAGNOSIS — M47817 Spondylosis without myelopathy or radiculopathy, lumbosacral region: Secondary | ICD-10-CM | POA: Insufficient documentation

## 2021-08-30 DIAGNOSIS — M5136 Other intervertebral disc degeneration, lumbar region: Secondary | ICD-10-CM

## 2021-08-30 DIAGNOSIS — M545 Low back pain, unspecified: Secondary | ICD-10-CM | POA: Insufficient documentation

## 2021-08-30 DIAGNOSIS — M4310 Spondylolisthesis, site unspecified: Secondary | ICD-10-CM

## 2021-08-30 DIAGNOSIS — M47816 Spondylosis without myelopathy or radiculopathy, lumbar region: Secondary | ICD-10-CM | POA: Diagnosis not present

## 2021-08-30 DIAGNOSIS — G8929 Other chronic pain: Secondary | ICD-10-CM | POA: Insufficient documentation

## 2021-08-30 DIAGNOSIS — Z6841 Body Mass Index (BMI) 40.0 and over, adult: Secondary | ICD-10-CM | POA: Insufficient documentation

## 2021-08-30 DIAGNOSIS — M5137 Other intervertebral disc degeneration, lumbosacral region: Secondary | ICD-10-CM | POA: Insufficient documentation

## 2021-08-30 MED ORDER — LIDOCAINE HCL 2 % IJ SOLN
INTRAMUSCULAR | Status: AC
  Filled 2021-08-30: qty 80

## 2021-08-30 MED ORDER — LIDOCAINE HCL 2 % IJ SOLN
20.0000 mL | Freq: Once | INTRAMUSCULAR | Status: AC
  Administered 2021-08-30: 12:00:00 400 mg

## 2021-08-30 MED ORDER — TRIAMCINOLONE ACETONIDE 40 MG/ML IJ SUSP
80.0000 mg | Freq: Once | INTRAMUSCULAR | Status: AC
  Administered 2021-08-30: 12:00:00 80 mg

## 2021-08-30 MED ORDER — PENTAFLUOROPROP-TETRAFLUOROETH EX AERO
INHALATION_SPRAY | Freq: Once | CUTANEOUS | Status: AC
  Administered 2021-08-30: 30 via TOPICAL
  Filled 2021-08-30: qty 116

## 2021-08-30 MED ORDER — ROPIVACAINE HCL 2 MG/ML IJ SOLN
INTRAMUSCULAR | Status: AC
  Filled 2021-08-30: qty 20

## 2021-08-30 MED ORDER — TRIAMCINOLONE ACETONIDE 40 MG/ML IJ SUSP
INTRAMUSCULAR | Status: AC
  Filled 2021-08-30: qty 2

## 2021-08-30 MED ORDER — ROPIVACAINE HCL 2 MG/ML IJ SOLN
18.0000 mL | Freq: Once | INTRAMUSCULAR | Status: AC
  Administered 2021-08-30: 12:00:00 18 mL via PERINEURAL

## 2021-08-30 NOTE — Progress Notes (Signed)
Safety precautions to be maintained throughout the outpatient stay will include: orient to surroundings, keep bed in low position, maintain call bell within reach at all times, provide assistance with transfer out of bed and ambulation.  

## 2021-08-30 NOTE — Patient Instructions (Addendum)
____________________________________________________________________________________________  Virtual Visits   What is a "Virtual Visit"? It is a Metallurgist (medical visit) that takes place on real time (NOT TEXT or E-MAIL) over the telephone or computer device (desktop, laptop, tablet, smart phone, etc.). It allows for more location flexibility between the patient and the healthcare provider.  Who decides when these types of visits will be used? The physician.  Who is eligible for these types of visits? Only those patients that can be reliably reached over the telephone.  What do you mean by reliably? We do not have time to call everyone multiple times, therefore those that tend to screen calls and then call back later are not suitable candidates for this system. We understand how people are reluctant to pickup on "unknown" calls, therefore, we suggest adding our telephone numbers to your list of "CONTACT(s)". This way, you should be able to readily identify our calls when you receive one. All of our numbers are available below.   Who is not eligible? This option is not available for medication management encounters, specially for controlled substances. Patients on pain medications that fall under the category of controlled substances have to come in for "Face-to-Face" encounters. This is required for mandatory monitoring of these substances. You may be asked to provide a sample for an unannounced urine drug screening test (UDS), and we will need to count your pain pills. Not bringing your pills to be counted may result in no refill. Obviously, neither one of these can be done over the phone.  When will this type of visits be used? You can request a virtual visit whenever you are physically unable to attend a regular appointment. The decision will be made by the physician (or healthcare provider) on a case by case basis.   At what time will I be called? This is an  excellent question. The providers will try to call you whenever they have time available. Do not expect to be called at any specific time. The secretaries will assign you a time for your virtual visit appointment, but this is done simply to keep a list of those patients that need to be called, but not for the purpose of keeping a time schedule. Be advised that the call may come in anytime during the day, between the hours of 8:00 AM and 8::00 PM, depending on provider availability. We do understand that the system is not perfect. If you are unable to be available that day on a moments notice, then request an "in-person" appointment rather than a "virtual visit".  Can I request my medication visits to be "Virtual"? Yes you may request it, but the decision is entirely up to the healthcare provider. Control substances require specific monitoring that requires Face-to-Face encounters. The number of encounters  and the extent of the monitoring is determined on a case by case basis.  Add a new contact to your smart phone and label it "PAIN CLINIC" Under this contact add the following numbers: Main: [phone] (Official Contact Number) Nurses: [phone] (These are outgoing only calling systems. Do not call this number.) Dr. Dossie Arbour: [phone] or [phone] (Outgoing calls only. Do not call this number.)  ____________________________________________________________________________________________  ____________________________________________________________________________________________  Post-Procedure Discharge Instructions  Instructions: Apply ice:  Purpose: This will minimize any swelling and discomfort after procedure.  When: Day of procedure, as soon as you get home. How: Fill a plastic sandwich bag with crushed ice. Cover it with a small towel and apply to injection  site. How long: (15 min on, 15 min off) Apply for 15 minutes then remove x 15 minutes.  Repeat sequence on day of  procedure, until you go to bed. Apply heat:  Purpose: To treat any soreness and discomfort from the procedure. When: Starting the next day after the procedure. How: Apply heat to procedure site starting the day following the procedure. How long: May continue to repeat daily, until discomfort goes away. Food intake: Start with clear liquids (like water) and advance to regular food, as tolerated.  Physical activities: Keep activities to a minimum for the first 8 hours after the procedure. After that, then as tolerated. Driving: If you have received any sedation, be responsible and do not drive. You are not allowed to drive for 24 hours after having sedation. Blood thinner: (Applies only to those taking blood thinners) You may restart your blood thinner 6 hours after your procedure. Insulin: (Applies only to Diabetic patients taking insulin) As soon as you can eat, you may resume your normal dosing schedule. Infection prevention: Keep procedure site clean and dry. Shower daily and clean area with soap and water. Post-procedure Pain Diary: Extremely important that this be done correctly and accurately. Recorded information will be used to determine the next step in treatment. For the purpose of accuracy, follow these rules: Evaluate only the area treated. Do not report or include pain from an untreated area. For the purpose of this evaluation, ignore all other areas of pain, except for the treated area. After your procedure, avoid taking a long nap and attempting to complete the pain diary after you wake up. Instead, set your alarm clock to go off every hour, on the hour, for the initial 8 hours after the procedure. Document the duration of the numbing medicine, and the relief you are getting from it. Do not go to sleep and attempt to complete it later. It will not be accurate. If you received sedation, it is likely that you were given a medication that may cause amnesia. Because of this, completing the  diary at a later time may cause the information to be inaccurate. This information is needed to plan your care. Follow-up appointment: Keep your post-procedure follow-up evaluation appointment after the procedure (usually 2 weeks for most procedures, 6 weeks for radiofrequencies). DO NOT FORGET to bring you pain diary with you.   Expect: (What should I expect to see with my procedure?) From numbing medicine (AKA: Local Anesthetics): Numbness or decrease in pain. You may also experience some weakness, which if present, could last for the duration of the local anesthetic. Onset: Full effect within 15 minutes of injected. Duration: It will depend on the type of local anesthetic used. On the average, 1 to 8 hours.  From steroids (Applies only if steroids were used): Decrease in swelling or inflammation. Once inflammation is improved, relief of the pain will follow. Onset of benefits: Depends on the amount of swelling present. The more swelling, the longer it will take for the benefits to be seen. In some cases, up to 10 days. Duration: Steroids will stay in the system x 2 weeks. Duration of benefits will depend on multiple posibilities including persistent irritating factors. Side-effects: If present, they may typically last 2 weeks (the duration of the steroids). Frequent: Cramps (if they occur, drink Gatorade and take over-the-counter Magnesium 450-500 mg once to twice a day); water retention with temporary weight gain; increases in blood sugar; decreased immune system response; increased appetite. Occasional: Facial flushing (red, warm  cheeks); mood swings; menstrual changes. Uncommon: Long-term decrease or suppression of natural hormones; bone thinning. (These are more common with higher doses or more frequent use. This is why we prefer that our patients avoid having any injection therapies in other practices.)  Very Rare: Severe mood changes; psychosis; aseptic necrosis. From procedure: Some  discomfort is to be expected once the numbing medicine wears off. This should be minimal if ice and heat are applied as instructed.  Call if: (When should I call?) You experience numbness and weakness that gets worse with time, as opposed to wearing off. New onset bowel or bladder incontinence. (Applies only to procedures done in the spine)  Emergency Numbers: Durning business hours (Monday - Thursday, 8:00 AM - 4:00 PM) (Friday, 9:00 AM - 12:00 Noon): [phone] After hours: [phone] NOTE: If you are having a problem and are unable connect with, or to talk to a provider, then go to your nearest urgent care or emergency department. If the problem is serious and urgent, please call 911. ____________________________________________________________________________________________  ____________________________________________________________________________________________  Post-Procedure Discharge Instructions  Instructions: Apply ice:  Purpose: This will minimize any swelling and discomfort after procedure.  When: Day of procedure, as soon as you get home. How: Fill a plastic sandwich bag with crushed ice. Cover it with a small towel and apply to injection site. How long: (15 min on, 15 min off) Apply for 15 minutes then remove x 15 minutes.  Repeat sequence on day of procedure, until you go to bed. Apply heat:  Purpose: To treat any soreness and discomfort from the procedure. When: Starting the next day after the procedure. How: Apply heat to procedure site starting the day following the procedure. How long: May continue to repeat daily, until discomfort goes away. Food intake: Start with clear liquids (like water) and advance to regular food, as tolerated.  Physical activities: Keep activities to a minimum for the first 8 hours after the procedure. After that, then as tolerated. Driving: If you have received any sedation, be responsible and do not drive. You are not allowed to  drive for 24 hours after having sedation. Blood thinner: (Applies only to those taking blood thinners) You may restart your blood thinner 6 hours after your procedure. Insulin: (Applies only to Diabetic patients taking insulin) As soon as you can eat, you may resume your normal dosing schedule. Infection prevention: Keep procedure site clean and dry. Shower daily and clean area with soap and water. Post-procedure Pain Diary: Extremely important that this be done correctly and accurately. Recorded information will be used to determine the next step in treatment. For the purpose of accuracy, follow these rules: Evaluate only the area treated. Do not report or include pain from an untreated area. For the purpose of this evaluation, ignore all other areas of pain, except for the treated area. After your procedure, avoid taking a long nap and attempting to complete the pain diary after you wake up. Instead, set your alarm clock to go off every hour, on the hour, for the initial 8 hours after the procedure. Document the duration of the numbing medicine, and the relief you are getting from it. Do not go to sleep and attempt to complete it later. It will not be accurate. If you received sedation, it is likely that you were given a medication that may cause amnesia. Because of this, completing the diary at a later time may cause the information to be inaccurate. This information is needed to plan your  care. Follow-up appointment: Keep your post-procedure follow-up evaluation appointment after the procedure (usually 2 weeks for most procedures, 6 weeks for radiofrequencies). DO NOT FORGET to bring you pain diary with you.   Expect: (What should I expect to see with my procedure?) From numbing medicine (AKA: Local Anesthetics): Numbness or decrease in pain. You may also experience some weakness, which if present, could last for the duration of the local anesthetic. Onset: Full effect within 15 minutes of  injected. Duration: It will depend on the type of local anesthetic used. On the average, 1 to 8 hours.  From steroids (Applies only if steroids were used): Decrease in swelling or inflammation. Once inflammation is improved, relief of the pain will follow. Onset of benefits: Depends on the amount of swelling present. The more swelling, the longer it will take for the benefits to be seen. In some cases, up to 10 days. Duration: Steroids will stay in the system x 2 weeks. Duration of benefits will depend on multiple posibilities including persistent irritating factors. Side-effects: If present, they may typically last 2 weeks (the duration of the steroids). Frequent: Cramps (if they occur, drink Gatorade and take over-the-counter Magnesium 450-500 mg once to twice a day); water retention with temporary weight gain; increases in blood sugar; decreased immune system response; increased appetite. Occasional: Facial flushing (red, warm cheeks); mood swings; menstrual changes. Uncommon: Long-term decrease or suppression of natural hormones; bone thinning. (These are more common with higher doses or more frequent use. This is why we prefer that our patients avoid having any injection therapies in other practices.)  Very Rare: Severe mood changes; psychosis; aseptic necrosis. From procedure: Some discomfort is to be expected once the numbing medicine wears off. This should be minimal if ice and heat are applied as instructed.  Call if: (When should I call?) You experience numbness and weakness that gets worse with time, as opposed to wearing off. New onset bowel or bladder incontinence. (Applies only to procedures done in the spine)  Emergency Numbers: Durning business hours (Monday - Thursday, 8:00 AM - 4:00 PM) (Friday, 9:00 AM - 12:00 Noon): [phone] After hours: [phone] NOTE: If you are having a problem and are unable connect with, or to talk to a provider, then go to your nearest urgent  care or emergency department. If the problem is serious and urgent, please call 911. ____________________________________________________________________________________________

## 2021-08-31 ENCOUNTER — Telehealth: Payer: Self-pay | Admitting: *Deleted

## 2021-08-31 NOTE — Telephone Encounter (Signed)
Attempted to call for post procedure follow-up. Message left. 

## 2021-09-12 DIAGNOSIS — Z96652 Presence of left artificial knee joint: Secondary | ICD-10-CM | POA: Diagnosis not present

## 2021-09-12 DIAGNOSIS — M659 Synovitis and tenosynovitis, unspecified: Secondary | ICD-10-CM | POA: Diagnosis not present

## 2021-09-12 DIAGNOSIS — T8484XA Pain due to internal orthopedic prosthetic devices, implants and grafts, initial encounter: Secondary | ICD-10-CM | POA: Diagnosis not present

## 2021-09-13 ENCOUNTER — Other Ambulatory Visit: Payer: Self-pay | Admitting: Orthopedic Surgery

## 2021-09-13 ENCOUNTER — Ambulatory Visit: Payer: Medicare Other | Attending: Pain Medicine | Admitting: Pain Medicine

## 2021-09-13 ENCOUNTER — Other Ambulatory Visit: Payer: Self-pay

## 2021-09-13 DIAGNOSIS — Z6841 Body Mass Index (BMI) 40.0 and over, adult: Secondary | ICD-10-CM

## 2021-09-13 DIAGNOSIS — M545 Low back pain, unspecified: Secondary | ICD-10-CM

## 2021-09-13 DIAGNOSIS — M4310 Spondylolisthesis, site unspecified: Secondary | ICD-10-CM

## 2021-09-13 DIAGNOSIS — M47817 Spondylosis without myelopathy or radiculopathy, lumbosacral region: Secondary | ICD-10-CM

## 2021-09-13 DIAGNOSIS — M5136 Other intervertebral disc degeneration, lumbar region: Secondary | ICD-10-CM

## 2021-09-13 DIAGNOSIS — Z789 Other specified health status: Secondary | ICD-10-CM

## 2021-09-13 DIAGNOSIS — Z96652 Presence of left artificial knee joint: Secondary | ICD-10-CM

## 2021-09-13 DIAGNOSIS — M47816 Spondylosis without myelopathy or radiculopathy, lumbar region: Secondary | ICD-10-CM

## 2021-09-13 DIAGNOSIS — M159 Polyosteoarthritis, unspecified: Secondary | ICD-10-CM | POA: Insufficient documentation

## 2021-09-13 DIAGNOSIS — M659 Synovitis and tenosynovitis, unspecified: Secondary | ICD-10-CM

## 2021-09-13 DIAGNOSIS — G8929 Other chronic pain: Secondary | ICD-10-CM

## 2021-09-13 NOTE — Progress Notes (Signed)
Patient: Jacqueline Delgado  Service Category: E/M  Provider: Gaspar Cola, MD  DOB: [birth year]  DOS: 09/13/2021  Location: Office  MRN: [id number]  Setting: Ambulatory outpatient  Referring Provider: Burnard Hawthorne, FNP  Type: Established Patient  Specialty: Interventional Pain Management  PCP: Jacqueline Hawthorne, FNP  Location: Remote location  Delivery: TeleHealth     Virtual Encounter - Pain Management PROVIDER NOTE: Information contained herein reflects review and annotations entered in association with encounter. Interpretation of such information and data should be left to medically-trained personnel. Information provided to patient can be located elsewhere in the medical record under "Patient Instructions". Document created using STT-dictation technology, any transcriptional errors that may result from process are unintentional.    Contact & Pharmacy Preferred: [phone] Home: [phone] (home) Mobile: [phone] (mobile) E-mail: [email or web address]  [id number] [id number]: [phone]: [phone] [facility] ([facility] [place]Phone: [phone]: [phone]  Pre-screening  Jacqueline Delgado offered "in-person" vs "virtual" encounter. Jacqueline indicated preferring virtual for this encounter.   Reason COVID-19*  Social distancing based on CDC and AMA recommendations.   I contacted Jacqueline DUFFORDon 09/13/2021 via telephone.      I clearly identified myself as FGaspar Cola MD. I verified that I was speaking with the correct person using two identifiers (Name: Jacqueline Delgado and date of birth: 4Jul 12, 1952.  Consent I sought verbal advanced consent from [name]. I informed Jacqueline Delgado of possible security and privacy concerns, risks, and  limitations associated with providing "not-in-person" medical evaluation and management services. I also informed Jacqueline Delgado of the availability of "in-person" appointments. Finally, I informed her that there would be a charge for the virtual visit and that Jacqueline could be  personally, fully or partially, financially responsible for it. Ms. SGrebexpressed understanding and agreed to proceed.   Historic Elements   [name] a 70y.o. year old, female patient evaluated today after our last contact on 08/30/2021. Ms. SNarayanan has a past medical history of Anemia, Anginal pain (HH[place], Arthritis, Colon polyp, GERD (gastroesophageal reflux disease), Hiatal hernia, History of hiatal hernia, History of methicillin resistant staphylococcus aureus (MRSA), Hypertension, Obesity, Reflux, Sleep apnea, and Small vessel disease (HTwilight. Jacqueline also  has a past surgical history that includes Joint replacement (Right); Joint replacement; Cholecystectomy; Abdominal hysterectomy; Shoulder arthroscopy with rotator cuff repair and subacromial decompression (Right, 06/14/2015); Esophagogastroduodenoscopy (egd) with propofol (N/A, 10/11/2015); Savory dilation (N/A, 10/11/2015); Colonoscopy w/ polypectomy; Esophagogastroduodenoscopy (egd) with propofol (N/A, 12/10/2017); Colonoscopy with propofol (N/A, 12/10/2017); Total knee arthroplasty (Left, 03/06/2021); and Joint replacement (Left). Ms. SEspinolahas a current medication list which includes the following prescription(s): aciphex, trulicity, hydrochlorothiazide, losartan, ondansetron, and tramadol. Jacqueline  reports that Jacqueline has never smoked. Jacqueline has never used smokeless tobacco. Jacqueline reports that Jacqueline does not drink alcohol and does not use drugs. Ms. SMiersis allergic to mobic [meloxicam].   HPI  Today, Jacqueline is being contacted for a post-procedure assessment.  The patient indicates having attained 100% relief of the pain for the duration of the local anesthetic which apparently  lasted for 2 days before it wore off and Jacqueline began to experience more pain than usual.  The patient will last lumbar MRI was done on 08/11/2020 and  the last set of the x-rays on 05/22/2020.  At that time the patient was seen to have degenerative disc disease as well as an anterolisthesis of L4 over L5 that seem to be new compared to prior imaging.  This is very likely due to the fact that the patient is morbidly obese and the pressure on the spine is causing significant changes that in turn cause her pain.  On 07/18/2020 the patient was referred to medical weight management and bariatric surgery evaluation.  At that time the patient's weight was 282 pounds with a BMI of 50.12 kg/m.  A year later, Jacqueline has gone down to a weight of 239 pounds and a BMI of 42.34 kg/m.  This represents a drop of 43 pounds or approximately a drop of 3.5 pounds per month.  This is more than a 10% drop in her weight, which is a good trend, but Jacqueline needs to make sure to keep up the effort as Jacqueline still needs to bring down her BMI to 30 kg/m, as a pain management goal.  The patient indicates that 2 days after the procedure Jacqueline began to experience some more pain that was primarily in the area of the lower back with prolonged standing.  Over the last year Jacqueline has lost 40 pounds and should be improving instead of feeling worse.  For this reason we will be ordering an x-ray of the lumbar spine to assess her anterolisthesis and make sure that it has not gotten worse.  The plan was shared with the patient who understood and accepted.  Post-Procedure Evaluation  Procedure(s):    Procedure:          Type: Lumbar Facet, Medial Branch Block(s)          Primary Purpose: Diagnostic/Therapeutic Region: Posterolateral Lumbosacral Spine Level: L2, L3, L4, L5, & S1 Medial Branch Level(s). Injecting these levels blocks the L3-4, L4-5, and L5-S1 lumbar facet joints. Laterality: Bilateral Anesthesia: Local (1-2% Lidocaine)  Anxiolysis: Oral Valium  (10 mg) Sedation: None  Guidance: Fluoroscopy          Indications: 1. Lumbosacral facet syndrome (Bilateral) (R>L)   2. Lumbar facet hypertrophy   3. Lumbar Grade 1 Anterolisthesis of L4/L5   4. Spondylosis without myelopathy or radiculopathy, lumbosacral region   5. Chronic low back pain (Bilateral) (R>L) w/o sciatica   6. DDD (degenerative disc disease), lumbosacral   7. BMI 40.0-44.9, adult (HCC)    Pain Score: Pre-procedure: 6 /10 Post-procedure: 0-No pain/10      Anxiolysis: Please see nurses note.  Effectiveness during initial hour after procedure (Ultra-Short Term Relief): 100 %.  Local anesthetic used: Long-acting (4-6 hours) Effectiveness: Defined as any analgesic benefit obtained secondary to the administration of local anesthetics. This carries significant diagnostic value as to the etiological location, or anatomical origin, of the pain. Duration of benefit is expected to coincide with the duration of the local anesthetic used.  Effectiveness during initial 4-6 hours after procedure (Short-Term Relief): 100 %.  Long-term benefit: Defined as any relief past the pharmacologic duration of the local anesthetics.  Effectiveness past the initial 6 hours after procedure (Long-Term Relief): 100 % (more pain since injection than Jacqueline has ever had.  2 days PP Jacqueline began to experience some pain and it has gotten worse.  feels worse than before Jacqueline received injection.  has been using tylenol and heating pad and that does bring some relief.).  Benefits, current: Defined as benefit present at the time of this evaluation.  Analgesia: The patient indicates that 2 days after the procedure Jacqueline began to experience some more pain that was primarily in the area of the lower back with prolonged standing.  Over the last year Jacqueline has lost 40 pounds and should be improving instead of feeling worse.  For this reason we will be ordering an x-ray of the lumbar spine to assess her anterolisthesis and make  sure that it has not gotten worse. Function: Jacqueline Delgado reports improvement in function ROM: Jacqueline Delgado reports improvement in ROM   Pharmacotherapy Assessment   Analgesic: Hydrocodone/homatropine solution #120 mL, 5 mL 4 times daily (20 MME/day) MME/day: 20 mg/day   Monitoring: [place] PMP: PDMP reviewed during this encounter.       Pharmacotherapy: No side-effects or adverse reactions reported. Compliance: No problems identified. Effectiveness: Clinically acceptable. Plan: Refer to "POC". UDS:  Summary  Date Value Ref Range Status  05/22/2020 Note  Final    Comment:    ==================================================================== Compliance Drug Analysis, Ur ==================================================================== Test                             Result       Flag       Units  Drug Present and Declared for Prescription Verification   Tramadol                       >3268        EXPECTED   ng/mg creat   O-Desmethyltramadol            2372         EXPECTED   ng/mg creat   N-Desmethyltramadol            1509         EXPECTED   ng/mg creat    Source of tramadol is a prescription medication. O-desmethyltramadol    and N-desmethyltramadol are expected metabolites of tramadol.    Bupropion                      PRESENT      EXPECTED   Hydroxybupropion               PRESENT      EXPECTED    Hydroxybupropion is an expected metabolite of bupropion.  Drug Present not Declared for Prescription Verification   Dextromethorphan               PRESENT      UNEXPECTED   Dextrorphan/Levorphanol        PRESENT      UNEXPECTED    Dextrorphan is an expected metabolite of dextromethorphan, an over-    the-counter or prescription cough suppressant. Dextrorphan cannot be    distinguished from the scheduled prescription medication levorphanol    by the method used for analysis.  ==================================================================== Test                      Result     Flag   Units      Ref Range   Creatinine              153              mg/dL      >=20 ==================================================================== Declared Medications:  The flagging and interpretation on this report are based on the  following declared medications.  Unexpected results may arise from  inaccuracies  in the declared medications.   **Note: The testing scope of this panel includes these medications:   Bupropion (Wellbutrin)  Tramadol (Ultram)   **Note: The testing scope of this panel does not include the  following reported medications:   Famotidine (Pepcid)  Hydrochlorothiazide (Hydrodiuril)  Losartan (Cozaar)  Meloxicam (Mobic)  Mirabegron (Myrbetriq)  Rabeprazole (Aciphex) ==================================================================== For clinical consultation, please call [phone]. ====================================================================      Laboratory Chemistry Profile   Renal Lab Results  Component Value Date   BUN 18 06/28/2021   CREATININE 0.79 06/28/2021   BCR 20 01/19/2021   GFR 76.34 06/28/2021   GFRAA 81 09/13/2020   GFRNONAA >60 03/13/2021    Hepatic Lab Results  Component Value Date   AST 16 02/26/2021   ALT 11 02/26/2021   ALBUMIN 3.7 02/26/2021   ALKPHOS 79 02/26/2021   LIPASE 36 10/12/2017    Electrolytes Lab Results  Component Value Date   NA 142 06/28/2021   K 3.8 06/28/2021   CL 106 06/28/2021   CALCIUM 9.8 06/28/2021   MG 2.0 05/22/2020    Bone Lab Results  Component Value Date   VD25OH 14.17 (L) 09/21/2019   25OHVITD1 18 (L) 05/22/2020   25OHVITD2 13 05/22/2020   25OHVITD3 5.3 05/22/2020    Inflammation (CRP: Acute Phase) (ESR: Chronic Phase) Lab Results  Component Value Date   CRP 15 (H) 05/22/2020   ESRSEDRATE 73 (H) 05/22/2020         Note: Above Lab results reviewed.  Imaging  DG PAIN CLINIC C-ARM 1-60 MIN NO REPORT Fluoro was used, but no Radiologist interpretation will  be provided.  Please refer to "NOTES" tab for provider progress note.  Assessment  The primary encounter diagnosis was Chronic low back pain (Bilateral) (R>L) w/o sciatica. Diagnoses of Lumbosacral facet syndrome (Bilateral) (R>L), Lumbar Grade 1 Anterolisthesis of L4/L5, DDD (degenerative disc disease), lumbar, Osteoarthritis of lumbar spine, Osteoarthritis involving multiple joints, Problems influencing health status, and Morbid obesity with BMI of 40.0-44.9, adult ([place]) were also pertinent to this visit.  Plan of Care  Problem-specific:  No problem-specific Assessment & Plan notes found for this encounter.  Jacqueline Delgado has a current medication list which includes the following long-term medication(s): aciphex, hydrochlorothiazide, losartan, and tramadol.  Pharmacotherapy (Medications Ordered): No orders of the defined types were placed in this encounter.  Orders:  Orders Placed This Encounter  Procedures   DG Lumbar Spine Complete W/Bend    Patient presents with axial pain with possible radicular component. Please assist [place] in identifying specific level(s) and laterality of any additional findings such as: 1. Facet (Zygapophyseal) joint DJD (Hypertrophy, space narrowing, subchondral sclerosis, and/or osteophyte formation) 2. DDD and/or IVDD (Loss of disc height, desiccation, gas patterns, osteophytes, endplate sclerosis, or "Black disc disease") 3. Pars defects 4. Spondylolisthesis, spondylosis, and/or spondyloarthropathies (include Degree/Grade of displacement in mm) (stability) 5. Vertebral body Fractures (acute/chronic) (state percentage of collapse) 6. Demineralization (osteopenia/osteoporotic) 7. Bone pathology 8. Foraminal narrowing  9. Surgical changes    Standing Status:   Future    Standing Expiration Date:   10/14/2021    Scheduling Instructions:     Imaging must be done as soon as possible. Inform patient that order will expire within 30 days and I will not renew  it.    Order Specific Question:   Reason for Exam (SYMPTOM  OR DIAGNOSIS REQUIRED)    Answer:   Low back pain    Order Specific Question:   Preferred imaging  location?    Answer:    [facility]    Order Specific Question:   Call Results- Best Contact Number?    Answer:   [phone] ([facility])    Order Specific Question:   Radiology Contrast Protocol - do NOT remove file path    Answer:   [email or web address]    Order Specific Question:   Release to patient    Answer:   Immediate    Follow-up plan:   Return in about 5 months (around 01/28/2022) for Eval-day (M,W), (F2F), (MM), for review of ordered tests (lumbar x-rays).     Interventional Therapies  Risk  Complexity Considerations:   Estimated body mass index is 42.34 kg/m as calculated from the following:   Height as of this encounter: _0  (1.6 m).   Weight as of this encounter: 239 lb (108.4 kg). Note: No RFA until BMI is below 35 kg/m.   Planned  Pending:   Second referral to bariatric surgery and medical weight management   Under consideration:   None   Completed:   Therapeutic/palliative bilateral lumbar facet MBB x3 (08/30/2021) (100/100/100/0) pain worsened after injection. Therapeutic right L4-5 LESI x1 (07/27/2020)    Therapeutic  Palliative (PRN) options:   Palliative bilateral lumbar facet MBB #4  Therapeutic right L4-5 LESI #2     Recent Visits Date Type Provider Dept  08/30/21 Procedure visit Milinda Pointer, MD [facility]  08/01/21 Office Visit Milinda Pointer, MD [facility]  Showing recent visits within past 90 days and meeting all other requirements Today's Visits Date Type Provider Dept  09/13/21 Office Visit Milinda Pointer, MD [facility]  Showing today's visits and meeting all other requirements Future Appointments No visits were found meeting these conditions. Showing future appointments within  next 90 days and meeting all other requirements I discussed the assessment and treatment plan with the patient. The patient was provided an opportunity to ask questions and all were answered. The patient agreed with the plan and demonstrated an understanding of the instructions.  Patient advised to call back or seek an in-person evaluation if the symptoms or condition worsens.  Duration of encounter: 16 minutes.  Note by: Jacqueline Cola, MD Date: 09/13/2021; Time: 5:27 PM

## 2021-09-13 NOTE — Patient Instructions (Signed)
______________________________________________________________________________________________  Body mass index (BMI)  Body mass index (BMI) is a common tool for deciding whether a person has an appropriate body weight.  It measures a persons weight in relation to their height.   According to the [facility] of health (NIH): A BMI of less than 18.5 means that a person is underweight. A BMI of between 18.5 and 24.9 is ideal. A BMI of between 25 and 29.9 is overweight. A BMI over 30 indicates obesity.  Weight Management Required  URGENT: Your weight has been found to be adversely affecting your health.  Dear [name]:  Your current Estimated body mass index is 42.34 kg/m as calculated from the following:   Height as of 08/30/21: 5' 3" (1.6 m).   Weight as of 08/30/21: 239 lb (108.4 kg).  Please use the table below to identify your weight category and associated incidence of chronic pain, secondary to your weight.  Body Mass Index (BMI) Classification BMI level (kg/m2) Category Associated incidence of chronic pain  <18  Underweight   18.5-24.9 Ideal body weight   25-29.9 Overweight  20%  30-34.9 Obese (Class I)  68%  35-39.9 Severe obesity (Class II)  136%  >40 Extreme obesity (Class III)  254%   In addition: You will be considered "Morbidly Obese", if your BMI is above 30 and you have one or more of the following conditions which are known to be caused and/or directly associated with obesity: 1.    Type 2 Diabetes (Which in turn can lead to cardiovascular diseases (CVD), stroke, peripheral vascular diseases (PVD), retinopathy, nephropathy, and neuropathy) 2.    Cardiovascular Disease (High Blood Pressure; Congestive Heart Failure; High Cholesterol; Coronary Artery Disease; Angina; or History of Heart Attacks) 3.    Breathing problems (Asthma; obesity-hypoventilation syndrome; obstructive sleep apnea; chronic inflammatory airway disease; reactive airway disease; or  shortness of breath) 4.    Chronic kidney disease 5.    Liver disease (nonalcoholic fatty liver disease) 6.    High blood pressure 7.    Acid reflux (gastroesophageal reflux disease; heartburn) 8.    Osteoarthritis (OA) (with any of the following: hip pain; knee pain; and/or low back pain) 9.    Low back pain (Lumbar Facet Syndrome; and/or Degenerative Disc Disease) 10.  Hip pain (Osteoarthritis of hip) (For every 1 lbs of added body weight, there is a 2 lbs increase in pressure inside of each hip articulation. 1:2 mechanical relationship) 11.  Knee pain (Osteoarthritis of knee) (For every 1 lbs of added body weight, there is a 4 lbs increase in pressure inside of each knee articulation. 1:4 mechanical relationship) (patients with a BMI>30 kg/m2 were 6.8 times more likely to develop knee OA than normal-weight individuals) 12.  Cancer: Epidemiological studies have shown that obesity is a risk factor for: post-menopausal breast cancer; cancers of the endometrium, colon and kidney cancer; malignant adenomas of the oesophagus. Obese subjects have an approximately 1.5-3.5-fold increased risk of developing these cancers compared with normal-weight subjects, and it has been estimated that between 15 and 45% of these cancers can be attributed to overweight. More recent studies suggest that obesity may also increase the risk of other types of cancer, including pancreatic, hepatic and gallbladder cancer. (Ref: Obesity and cancer. Pischon T, Nthlings U, [employer] H. Proc Nutr Soc. 2008 May;67(2):128-45. doi: 27.2536/U4403474259563875.) The International Agency for Research on Cancer (IARC) has identified 13 cancers associated with overweight and obesity: meningioma, multiple myeloma, adenocarcinoma of the esophagus, and cancers of the thyroid,  postmenopausal breast cancer, gallbladder, stomach, liver, pancreas, kidney, ovaries, uterus, colon and rectal (colorectal) cancers. 78 percent of all cancers diagnosed in women  and 24 percent of those diagnosed in men are associated with overweight and obesity.  Recommendation: At this point it is urgent that you take a step back and concentrate in loosing weight. Dedicate 100% of your efforts on this task. Nothing else will improve your health more than bringing your weight down and your BMI to less than 30. If you are here, you probably have chronic pain. We know that most chronic pain patients have difficulty exercising secondary to their pain. For this reason, you must rely on proper nutrition and diet in order to lose the weight. If your BMI is above 40, you should seriously consider bariatric surgery. A realistic goal is to lose 10% of your body weight over a period of 12 months.  Be honest to yourself, if over time you have unsuccessfully tried to lose weight, then it is time for you to seek professional help and to enter a medically supervised weight management program, and/or undergo bariatric surgery. Stop procrastinating.   Pain management considerations:  1.    Pharmacological Problems: Be advised that the use of opioid analgesics (oxycodone; hydrocodone; morphine; methadone; codeine; and all of their derivatives) have been associated with decreased metabolism and weight gain.  For this reason, should we see that you are unable to lose weight while taking these medications, it may become necessary for [place] to taper down and indefinitely discontinue them.  2.    Technical Problems: The incidence of successful interventional therapies decreases as the patient's BMI increases. It is much more difficult to accomplish a safe and effective interventional therapy on a patient with a BMI above 35. 3.    Radiation Exposure Problems: The x-rays machine, used to accomplish injection therapies, will automatically increase their x-ray output in order to capture an appropriate bone image. This means that radiation exposure increases exponentially with the patient's BMI. (The higher the  BMI, the higher the radiation exposure.) Although the level of radiation used at a given time is still safe to the patient, it is not for the physician and/or assisting staff. Unfortunately, radiation exposure is accumulative. Because physicians and the staff have to do procedures and be exposed on a daily basis, this can result in health problems such as cancer and radiation burns. Radiation exposure to the staff is monitored by the radiation batches that they wear. The exposure levels are reported back to the staff on a quarterly basis. Depending on levels of exposure, physicians and staff may be obligated by law to decrease this exposure. This means that they have the right and obligation to refuse providing therapies where they may be overexposed to radiation. For this reason, physicians may decline to offer therapies such as radiofrequency ablation or implants to patients with a BMI above 40. 4.    Current Trends: Be advised that the current trend is to no longer offer certain therapies to patients with a BMI equal to, or above 35, due to increase perioperative risks, increased technical procedural difficulties, and excessive radiation exposure to healthcare personnel.  ______________________________________________________________________________________________

## 2021-09-17 DIAGNOSIS — H2513 Age-related nuclear cataract, bilateral: Secondary | ICD-10-CM | POA: Diagnosis not present

## 2021-09-19 ENCOUNTER — Telehealth (INDEPENDENT_AMBULATORY_CARE_PROVIDER_SITE_OTHER): Payer: Medicare Other | Admitting: Family

## 2021-09-19 ENCOUNTER — Encounter: Payer: Self-pay | Admitting: Family

## 2021-09-19 VITALS — BP 124/80 | HR 76 | Ht 63.0 in | Wt 242.6 lb

## 2021-09-19 DIAGNOSIS — F331 Major depressive disorder, recurrent, moderate: Secondary | ICD-10-CM | POA: Diagnosis not present

## 2021-09-19 DIAGNOSIS — I10 Essential (primary) hypertension: Secondary | ICD-10-CM | POA: Diagnosis not present

## 2021-09-19 DIAGNOSIS — M545 Low back pain, unspecified: Secondary | ICD-10-CM

## 2021-09-19 DIAGNOSIS — R7303 Prediabetes: Secondary | ICD-10-CM | POA: Diagnosis not present

## 2021-09-19 DIAGNOSIS — G8929 Other chronic pain: Secondary | ICD-10-CM | POA: Diagnosis not present

## 2021-09-19 DIAGNOSIS — Z6841 Body Mass Index (BMI) 40.0 and over, adult: Secondary | ICD-10-CM

## 2021-09-19 DIAGNOSIS — D649 Anemia, unspecified: Secondary | ICD-10-CM

## 2021-09-19 MED ORDER — TIRZEPATIDE 2.5 MG/0.5ML ~~LOC~~ SOAJ: 2.5000 mg | SUBCUTANEOUS | 1 refills | Status: DC

## 2021-09-19 MED ORDER — BUPROPION HCL ER (XL) 150 MG PO TB24: 150.0000 mg | ORAL_TABLET | Freq: Every day | ORAL | 1 refills | Status: DC

## 2021-09-19 NOTE — Progress Notes (Signed)
Virtual Visit via Video Note  I connected with@  on 09/23/21 at  3:00 PM EST by a video enabled telemedicine application and verified that I am speaking with the correct person using two identifiers.  Location patient: home Location provider:work  Persons participating in the virtual visit: patient, provider  I discussed the limitations of evaluation and management by telemedicine and the availability of in person appointments. The patient expressed understanding and agreed to proceed.  Interactive audio and video telecommunications were attempted between this provider and patient, however failed, due to patient having technical difficulties or patient did not have access to video capability.  We continued and completed visit with audio only.   HPI: She feels trulicity 4.5mg  not working for her. She no longer feels full and is not losing weight. She would like to trial another medication.   Depression has been worsening of late. She has been able to find a new job and thinks this is contributory.  She feels like Cymbalta has not been helping and affected her stomach. She has therefore stopped the medication. She was in wellbutrin in the past and not sure why she stopped. No h/o seizure, bulimia , anorexia,  heavy alcohol use.   Continues to follow with pain management, Dr Dossie Arbour, for chronic low back pain. Tramadol as prescribed by Dr Dossie Arbour is no longer helping. She has periodically used extra strength tylenol 500mg  with some relief.   HTN- compliant with HCTZ 25mg , losartan 50mg .   Status post total left knee replacement  Consult with Dr. Erlene Quan 08/01/21 for pyuria, OAB.  Advise no further evaluation for pyuria  ROS: See pertinent positives and negatives per HPI.    EXAM:  VITALS per patient if applicable: BP 756/43   Pulse 76   Ht 5\' 3"  (1.6 m)   Wt 242 lb 9.6 oz (110 kg)   BMI 42.97 kg/m  BP Readings from Last 3 Encounters:  09/19/21 124/80  08/30/21 134/90  08/01/21  138/82   Wt Readings from Last 3 Encounters:  09/19/21 242 lb 9.6 oz (110 kg)  08/30/21 239 lb (108.4 kg)  08/01/21 239 lb (108.4 kg)     ASSESSMENT AND PLAN:  Discussed the following assessment and plan:  Problem List Items Addressed This Visit       Cardiovascular and Mediastinum   Essential hypertension    Chronic, stable. Continue HCTZ 25mg , losartan 50mg .         Other   Chronic low back pain (Bilateral) (R>L) w/o sciatica (Chronic)    Chronic, suboptimal control. Cymbalta not helpful. She is taking tramadol as prescribed by Dr Dossie Arbour with little relief.  Discussed our effort as it relates to weight loss a treatment strategy. Advised to schedule tylenol arthritis. Will follow.       Relevant Medications   buPROPion (WELLBUTRIN XL) 150 MG 24 hr tablet   Anemia - Primary   Relevant Orders   CBC with Differential/Platelet   Depression, major, recurrent, moderate (HCC)    Uncontrolled. cymbalta not effective. Trial of wellbutrin for additional benefit of control of appetite. Close follow up.       Relevant Medications   buPROPion (WELLBUTRIN XL) 150 MG 24 hr tablet   Morbid obesity with BMI of 40.0-44.9, adult (HCC)   Relevant Medications   tirzepatide (MOUNJARO) 2.5 MG/0.5ML Pen   buPROPion (WELLBUTRIN XL) 150 MG 24 hr tablet   Other Relevant Orders   Comprehensive metabolic panel   Hemoglobin A1c   Lipid panel  TSH   VITAMIN D 25 Hydroxy (Vit-D Deficiency, Fractures)   Prediabetes    Pending a1c. Start mounjaro       -we discussed possible serious and likely etiologies, options for evaluation and workup, limitations of telemedicine visit vs in person visit, treatment, treatment risks and precautions. Pt prefers to treat via telemedicine empirically rather then risking or undertaking an in person visit at this moment.  .   I discussed the assessment and treatment plan with the patient. The patient was provided an opportunity to ask questions and all were  answered. The patient agreed with the plan and demonstrated an understanding of the instructions.   The patient was advised to call back or seek an in-person evaluation if the symptoms worsen or if the condition fails to improve as anticipated.   Mable Paris, FNP

## 2021-09-19 NOTE — Patient Instructions (Addendum)
Please call to schedule fasting lab in the next couple   start wellbutrin 150mg   As discussed, let's start by scheduling Tylenol Arthritis which is a 650mg  tablet .   You may take 1-2 tablets every 8 hours ( scheduled) with maximum of 6 tablets per day.   For example , you could take two tablets in the morning ( 8am) and then two tablets again at 4pm.   Stop Trulicity  If Jacqueline Delgado is not covered through your insurance, you may go to the [employer] at [email or web address] to complete information for savings card.   You may also use the link below.   [email or web address]  You may take this savings to [facility], [facility] or [facility]. If you are unable to get mounjaro, please call to schedule an appointment with me so we can discuss alternative weight loss medications.   start Mounjaro 2.5mg  once per week injected subcutaneously ( [place])  in stomach. Please clean with alcohol swab prior to injection and be sure to rotate site. You may schedule a nurse visit if you would like to first injection.   After 4 weeks, and if tolerated and weight loss has not reached 1-2 lbs per week, please increase to 5mg  once per week [place].    Please read information on medication below and remember black box warning that you may not take if you or a family member is diagnosed with thyroid cancer (medullary thyroid cancer), or multiple endocrine neoplasia.       Tirzepatide Injection Jacqueline Delgado) What is this medication? TIRZEPATIDE (tir ZEP a tide) treats type 2 diabetes. It works by increasing insulin levels in your body, which decreases your blood sugar (glucose). Changes to diet and exercise are often combined with this medication. This medicine may be used for other purposes; ask your health care provider or pharmacist if you have questions. COMMON BRAND NAME(S): MOUNJARO What should I tell my care team before I take this medication? They need to know if you have any of these  conditions: Endocrine tumors (MEN 2) or if someone in your family had these tumors Eye disease, vision problems Gallbladder disease History of pancreatitis Kidney disease Stomach or intestine problems Thyroid cancer or if someone in your family had thyroid cancer An unusual or allergic reaction to tirzepatide, other medications, foods, dyes, or preservatives Pregnant or trying to get pregnant Breast-feeding How should I use this medication? This medication is injected under the skin. You will be taught how to prepare and give it. It is given once every week (every 7 days). Keep taking it unless your health care provider tells you to stop. If you use this medication with insulin, you should inject this medication and the insulin separately. Do not mix them together. Do not give the injections right next to each other. Change (rotate) injection sites with each injection. This medication comes with INSTRUCTIONS FOR USE. Ask your pharmacist for directions on how to use this medication. Read the information carefully. Talk to your pharmacist or care team if you have questions. It is important that you put your used needles and syringes in a special sharps container. Do not put them in a trash can. If you do not have a sharps container, call your pharmacist or care team to get one. A special MedGuide will be given to you by the pharmacist with each prescription and refill. Be sure to read this information carefully each time. Talk to your care team about the use of this medication in children. Special  care may be needed. Overdosage: If you think you have taken too much of this medicine contact a poison control center or emergency room at once. NOTE: This medicine is only for you. Do not share this medicine with others. What if I miss a dose? If you miss a dose, take it as soon as you can unless it is more than 4 days (96 hours) late. If it is more than 4 days late, skip the missed dose. Take the next  dose at the normal time. Do not take 2 doses within 3 days of each other. What may interact with this medication? Alcohol containing beverages Antiviral medications for HIV or AIDS Aspirin and aspirin-like medications Beta-blockers like atenolol, metoprolol, propranolol Certain medications for blood pressure, heart disease, irregular heart beat Chromium Clonidine Diuretics Female hormones, such as estrogens or progestins, birth control pills Fenofibrate Gemfibrozil Guanethidine Isoniazid Lanreotide Female hormones or anabolic steroids MAOIs like Carbex, Eldepryl, Marplan, Nardil, and Parnate Medications for weight loss Medications for allergies, asthma, cold, or cough Medications for depression, anxiety, or psychotic disturbances Niacin Nicotine NSAIDs, medications for pain and inflammation, like ibuprofen or naproxen Octreotide Other medications for diabetes, like glyburide, glipizide, or glimepiride Pasireotide Pentamidine Phenytoin Probenecid Quinolone antibiotics such as ciprofloxacin, levofloxacin, ofloxacin Reserpine Some herbal dietary supplements Steroid medications such as prednisone or cortisone Sulfamethoxazole; trimethoprim Thyroid hormones Warfarin This list may not describe all possible interactions. Give your health care provider a list of all the medicines, herbs, non-prescription drugs, or dietary supplements you use. Also tell them if you smoke, drink alcohol, or use illegal drugs. Some items may interact with your medicine. What should I watch for while using this medication? Visit your care team for regular checks on your progress. Drink plenty of fluids while taking this medication. Check with your care team if you get an attack of severe diarrhea, nausea, and vomiting. The loss of too much body fluid can make it dangerous for you to take this medication. A test called the HbA1C (A1C) will be monitored. This is a simple blood test. It measures your blood  sugar control over the last 2 to 3 months. You will receive this test every 3 to 6 months. Learn how to check your blood sugar. Learn the symptoms of low and high blood sugar and how to manage them. Always carry a quick-source of sugar with you in case you have symptoms of low blood sugar. Examples include hard sugar candy or glucose tablets. Make sure others know that you can choke if you eat or drink when you develop serious symptoms of low blood sugar, such as seizures or unconsciousness. They must get medical help at once. Tell your care team if you have high blood sugar. You might need to change the dose of your medication. If you are sick or exercising more than usual, you might need to change the dose of your medication. Do not skip meals. Ask your care team if you should avoid alcohol. Many nonprescription cough and cold products contain sugar or alcohol. These can affect blood sugar. Pens should never be shared. Even if the needle is changed, sharing may result in passing of viruses like hepatitis or HIV. Wear a medical ID bracelet or chain, and carry a card that describes your disease and details of your medication and dosage times. Birth control may not work properly while you are taking this medication. If you take birth control pills by mouth, your care team may recommend another type of birth control  for 4 weeks after you start this medication and for 4 weeks after each increase in your dose of this medication. Ask your care team which birth control methods you should use. What side effects may I notice from receiving this medication? Side effects that you should report to your care team as soon as possible: Allergic reactions-skin rash, itching, hives, swelling of the face, lips, tongue, or throat Change in vision Dehydration-increased thirst, dry mouth, feeling faint or lightheaded, headache, dark yellow or brown urine Gallbladder problems-severe stomach pain, nausea, vomiting,  fever Kidney injury-decrease in the amount of urine, swelling of the ankles, hands, or feet Pancreatitis-severe stomach pain that spreads to your back or gets worse after eating or when touched, fever, nausea, vomiting Thyroid cancer-new mass or lump in the neck, pain or trouble swallowing, trouble breathing, hoarseness Side effects that usually do not require medical attention (report these to your care team if they continue or are bothersome): Constipation Diarrhea Loss of Appetite Nausea Stomach pain Upset stomach Vomiting This list may not describe all possible side effects. Call your doctor for medical advice about side effects. You may report side effects to FDA at 1-800-FDA-1088. Where should I keep my medication? Keep out of the reach of children and pets. Refrigeration (preferred): Store unopened pens in a refrigerator between 2 and 8 degrees C (36 and 46 degrees F). Keep it in the original carton until you are ready to take it. Do not freeze or use if the medication has been frozen. Protect from light. Get rid of any unused medication after the expiration date on the label. Room Temperature: The pen may be stored at room temperature below 30 degrees C (86 degrees F) for up to a total of 21 days if needed. Protect from light. Avoid exposure to extreme heat. If it is stored at room temperature, throw away any unused medication after 21 days or after it expires, whichever is first. The pen has glass parts. Handle it carefully. If you drop the pen on a hard surface, do not use it. Use a new pen for your injection. To get rid of medications that are no longer needed or have expired: Take the medication to a medication take-back program. Check with your pharmacy or law enforcement to find a location. If you cannot return the medication, ask your pharmacist or care team how to get rid of this medication safely. NOTE: This sheet is a summary. It may not cover all possible information. If you  have questions about this medicine, talk to your doctor, pharmacist, or health care provider.  2022 Elsevier/Gold Standard (2021-02-27 13:57:48)

## 2021-09-19 NOTE — Assessment & Plan Note (Signed)
Pending a1c. Start [employer]

## 2021-09-21 ENCOUNTER — Telehealth: Payer: Self-pay | Admitting: Family

## 2021-09-21 NOTE — Telephone Encounter (Signed)
I spoke with patient & she stated after her VV 12/7 that she started to feel poorly. Her granddaughter had been sick & she has caught what she has had. She said that she is coughing so much she really wanted something prescribed to help. I suggested that she do the mychart urgent care visit since we have no appointments in office today. Pt stated that she would do so.

## 2021-09-21 NOTE — Telephone Encounter (Signed)
Pt called in regards to not feeling well. Pt was seen VV 12/7 at 3pm. Pt started to not feel well that same day. Pt states she has a severe cough, hoarseness, headaches. Pt was wanting to know if she could have cough medicine sent in. Pt was advised we may need another appt for her to get her tested.

## 2021-09-23 NOTE — Assessment & Plan Note (Signed)
Chronic, stable. Continue HCTZ 25mg , losartan 50mg .

## 2021-09-23 NOTE — Assessment & Plan Note (Signed)
Chronic, suboptimal control. Cymbalta not helpful. She is taking tramadol as prescribed by Dr Dossie Arbour with little relief.  Discussed our effort as it relates to weight loss a treatment strategy. Advised to schedule tylenol arthritis. Will follow.

## 2021-09-23 NOTE — Assessment & Plan Note (Signed)
Uncontrolled. cymbalta not effective. Trial of wellbutrin for additional benefit of control of appetite. Close follow up.

## 2021-09-24 ENCOUNTER — Telehealth: Payer: Medicare Other | Admitting: Nurse Practitioner

## 2021-09-24 ENCOUNTER — Encounter: Payer: Self-pay | Admitting: Nurse Practitioner

## 2021-09-24 DIAGNOSIS — J40 Bronchitis, not specified as acute or chronic: Secondary | ICD-10-CM

## 2021-09-24 MED ORDER — BENZONATATE 200 MG PO CAPS
200.0000 mg | ORAL_CAPSULE | Freq: Three times a day (TID) | ORAL | 0 refills | Status: AC | PRN
Start: 2021-09-24 — End: 2021-10-04

## 2021-09-24 MED ORDER — AMOXICILLIN-POT CLAVULANATE 875-125 MG PO TABS: 1.0000 | ORAL_TABLET | Freq: Two times a day (BID) | ORAL | 0 refills | Status: AC

## 2021-09-24 NOTE — Progress Notes (Signed)
Virtual Visit Consent   Jacqueline Delgado, you are scheduled for a virtual visit with a [facility] provider today.     Just as with appointments in the office, your consent must be obtained to participate.  Your consent will be active for this visit and any virtual visit you may have with one of our providers in the next 365 days.     If you have a MyChart account, a copy of this consent can be sent to you electronically.  All virtual visits are billed to your insurance company just like a traditional visit in the office.    As this is a virtual visit, video technology does not allow for your provider to perform a traditional examination.  This may limit your provider's ability to fully assess your condition.  If your provider identifies any concerns that need to be evaluated in person or the need to arrange testing (such as labs, EKG, etc.), we will make arrangements to do so.     Although advances in technology are sophisticated, we cannot ensure that it will always work on either your end or our end.  If the connection with a video visit is poor, the visit may have to be switched to a telephone visit.  With either a video or telephone visit, we are not always able to ensure that we have a secure connection.     I need to obtain your verbal consent now.   Are you willing to proceed with your visit today?    Jacqueline Delgado has provided verbal consent on 09/24/2021 for a virtual visit (video or telephone).   Jacqueline Schneiders, Delgado   Date: 09/24/2021 10:36 AM   Virtual Visit via Video Note   I, Jacqueline Delgado, connected with  Jacqueline Delgado  ([id number], [birth year]) on 09/24/21 at 10:55 AM EST by a video-enabled telemedicine application and verified that I am speaking with the correct person using two identifiers.  Location: Patient: Virtual Visit Location Patient: Home Provider: Virtual Visit Location Provider: Home Office   I discussed the limitations of evaluation and management by telemedicine  and the availability of in person appointments. The patient expressed understanding and agreed to proceed.    History of Present Illness: Jacqueline Delgado is a 70 y.o. who identifies as a female who was assigned female at birth, and is being seen today with complaints of a cough that has been progressively getting worse over the past week.   The cough started dry but is now productive.   She denies a fever.   When her symptoms started she did not have any testing done for flu or COVID.   Denies any shortness of breath or chest pain or tightness.   She has not been taking anything over the counter.   She has a history of HTN.  Denies a history of asthma.    Problems:  Patient Active Problem List   Diagnosis Date Noted   Prediabetes 09/19/2021   Osteoarthritis of lumbar spine 09/13/2021   Osteoarthritis involving multiple joints 09/13/2021   Sterile pyuria 08/06/2021   Chronic knee pain after total replacement of knee joint (Left) 08/01/2021   Trouble in sleeping 06/20/2021   Urinary frequency 05/23/2021   S/P TKR (total knee replacement) using cement, left 03/06/2021   Nasal congestion 02/21/2021   OAB (overactive bladder) 33/29/5188   Uncomplicated opioid dependence ([place]) 10/04/2020   Chronic low back pain (Bilateral) (R>L) w/o sciatica 09/18/2020   Spondylosis without myelopathy or  radiculopathy, lumbosacral region 09/18/2020   Lumbosacral facet syndrome (Bilateral) (R>L) 08/23/2020   Loose stools 08/21/2020   Morbid obesity with BMI of 40.0-44.9, adult ([place]) 08/21/2020   Hypokalemia 07/18/2020   Hypocalcemia 07/18/2020   Hypoalbuminemia 07/18/2020   Chronic lower extremity pain (2ry area of Pain) (Bilateral) (R>L) 05/23/2020   Morbid obesity ([place]) 05/23/2020   Elevated C-reactive protein (CRP) 05/23/2020   Elevated sed rate 05/23/2020   Calcaneal spur of foot (Right) 05/23/2020   Lumbar Grade 1 Anterolisthesis of L4/L5 05/23/2020   Chronic pain syndrome 05/22/2020    Pharmacologic therapy 05/22/2020   Disorder of skeletal system 05/22/2020   Problems influencing health status 05/22/2020   DDD (degenerative disc disease), lumbosacral 05/22/2020   Lumbar facet hypertrophy 05/22/2020   Abnormal MRI, lumbar spine (04/12/2013) 05/22/2020   Lumbosacral central spinal stenosis (9 mm) (L4-5 and L5-S1) 05/22/2020   Chronic lower extremity pain (Right) 05/22/2020   Lumbosacral radiculopathy at L5 (Right) 05/22/2020   Chronic knee pain (3ry area of Pain) (Left) 05/22/2020   Swelling of foot (4th area of Pain) (Right) 05/22/2020   Pneumonia due to COVID-19 virus 04/09/2020   Ankle swelling, right 04/03/2020   Hiatal hernia 02/18/2020   Small vessel vasculitis ([place]) 02/18/2020   Non-cardiac chest pain 02/18/2020   Vaginal discharge 10/01/2019   SOB (shortness of breath) on exertion 09/23/2019   Anemia 09/23/2019   B12 deficiency 09/23/2019   Vitamin D deficiency 09/23/2019   Hematuria 05/14/2019   Hot flashes 05/14/2019   Screening for breast cancer 05/14/2019   Skin fissures 04/22/2019   Fatigue 12/09/2018   Localized swelling of finger of left hand 11/18/2018   Cough 11/11/2018   Genital herpes simplex 11/11/2018   Essential hypertension 11/11/2018   Plantar fasciitis 05/03/2016   OSA (obstructive sleep apnea) 04/23/2016   Acquired trigger finger 11/23/2015   Depression, major, recurrent, moderate ([place]) 07/06/2015   Closed nondisplaced fracture of base of fifth metacarpal bone of right hand 06/22/2015   S/P rotator cuff repair 06/22/2015   Complete tear of right rotator cuff 05/23/2015   Osteoarthritis of knee (Left) 02/22/2015   DDD (degenerative disc disease), cervical 08/03/2014   DDD (degenerative disc disease), lumbar 08/03/2014   Leukocytoclastic vasculitis ([place]) 08/03/2014   Neck pain 08/03/2014   Cerebral vascular malformation 08/02/2014   Unspecified osteoarthritis, unspecified site 07/05/2014   GERD (gastroesophageal reflux disease)  09/20/2013   Chronic low back pain (1ry area of Pain) (Bilateral) (R>L) w/ sciatica (Right) 09/20/2013   Osteoarthritis, knee 09/20/2013   Essential (primary) hypertension 09/20/2013   Adenomatous colon polyp 03/21/2006    Allergies:  Allergies  Allergen Reactions   Mobic [Meloxicam]     'stomach upset'    Medications:  Current Outpatient Medications:    ACIPHEX 20 MG tablet, TAKE 1 TABLET (20 MG TOTAL) BY MOUTH IN THE MORNING AND AT BEDTIME.(DAW 1-BRAND NAME), Disp: 180 tablet, Rfl: 1   buPROPion (WELLBUTRIN XL) 150 MG 24 hr tablet, Take 1 tablet (150 mg total) by mouth daily., Disp: 90 tablet, Rfl: 1   hydrochlorothiazide (HYDRODIURIL) 25 MG tablet, Take 1 tablet (25 mg total) by mouth daily., Disp: 90 tablet, Rfl: 3   losartan (COZAAR) 50 MG tablet, TAKE 1 TABLET BY MOUTH EVERY DAY, Disp: 90 tablet, Rfl: 1   ondansetron (ZOFRAN-ODT) 4 MG disintegrating tablet, TAKE 1 TABLET BY MOUTH EVERY 8 HOURS AS NEEDED FOR NAUSEA AND VOMITING, Disp: 30 tablet, Rfl: 1   tirzepatide (MOUNJARO) 2.5 MG/0.5ML Pen, Inject 2.5 mg into  the skin once a week., Disp: 2 mL, Rfl: 1   traMADol (ULTRAM) 50 MG tablet, Take 1 tablet (50 mg total) by mouth 2 (two) times daily as needed for severe pain. Each refill must last 30 days., Disp: 60 tablet, Rfl: 5  Observations/Objective: Patient is well-developed, well-nourished in no acute distress.  Resting comfortably at home.  Head is normocephalic, atraumatic.  No labored breathing.  Speech is clear and coherent with logical content.  Patient is alert and oriented at baseline.    Assessment and Plan:  1. Bronchitis Take antibiotic with food   - amoxicillin-clavulanate (AUGMENTIN) 875-125 MG tablet; Take 1 tablet by mouth 2 (two) times daily for 10 days.  Dispense: 20 tablet; Refill: 0 - benzonatate (TESSALON) 200 MG capsule; Take 1 capsule (200 mg total) by mouth 3 (three) times daily as needed for up to 10 days for cough.  Dispense: 30 capsule; Refill: 0     Follow Up Instructions: I discussed the assessment and treatment plan with the patient. The patient was provided an opportunity to ask questions and all were answered. The patient agreed with the plan and demonstrated an understanding of the instructions.  A copy of instructions were sent to the patient via MyChart unless otherwise noted below.    The patient was advised to call back or seek an in-person evaluation if the symptoms worsen or if the condition fails to improve as anticipated.  Time:  I spent 15 minutes with the patient via telehealth technology discussing the above problems/concerns.    Jacqueline Schneiders, Delgado

## 2021-09-24 NOTE — Progress Notes (Signed)
Patient sent mychart message in regards to potassium she has already been scheduled for fasting labs.

## 2021-09-25 ENCOUNTER — Telehealth: Payer: Self-pay | Admitting: Family

## 2021-09-25 DIAGNOSIS — R059 Cough, unspecified: Secondary | ICD-10-CM

## 2021-09-25 NOTE — Telephone Encounter (Signed)
Patient did a virtual through her MyChart yesterday. She is calling because she is coughing so much and would like some cough mediation. Virtual doctor would not prescribe the cough medication that she is requesting.

## 2021-09-26 NOTE — Telephone Encounter (Signed)
Brock  Which cough med does she prefer?  She was sent in [place] from virtual visit

## 2021-09-26 NOTE — Telephone Encounter (Signed)
Patient wants something that can calm her coughing down so she can sleep. She has not be able to have a good nights sleep in a week. The tessalon pearls are not working per patient.

## 2021-09-28 MED ORDER — HYDROCOD POLST-CPM POLST ER 10-8 MG/5ML PO SUER: 5.0000 mL | Freq: Every evening | ORAL | 0 refills | Status: DC | PRN

## 2021-09-28 NOTE — Telephone Encounter (Signed)
Call patient I have sent in Tussionex which is a cough syrup with codeine in it which is very helpful for coughing at bedtime.  Please advise that she may not take this medication with another pain medication including Percocet or oxycodone.  She may not take this medication with anything sedating.  She may not take this medication with alcohol.  As I did  not evaluate her cough, if cough were to worsen anyway, please advise that she goes to urgent care over the weekend  I looked up patient on [place] Controlled Substances Reporting System PMP AWARE and saw no activity that raised concern of inappropriate use.

## 2021-09-28 NOTE — Addendum Note (Signed)
Addended by: Burnard Hawthorne on: 09/28/2021 11:08 AM   Modules accepted: Orders

## 2021-10-05 ENCOUNTER — Other Ambulatory Visit (INDEPENDENT_AMBULATORY_CARE_PROVIDER_SITE_OTHER): Payer: Medicare Other

## 2021-10-05 ENCOUNTER — Other Ambulatory Visit: Payer: Self-pay | Admitting: Family

## 2021-10-05 ENCOUNTER — Other Ambulatory Visit: Payer: Self-pay

## 2021-10-05 ENCOUNTER — Telehealth: Payer: Self-pay | Admitting: Family

## 2021-10-05 DIAGNOSIS — Z6841 Body Mass Index (BMI) 40.0 and over, adult: Secondary | ICD-10-CM | POA: Diagnosis not present

## 2021-10-05 DIAGNOSIS — D649 Anemia, unspecified: Secondary | ICD-10-CM | POA: Diagnosis not present

## 2021-10-05 DIAGNOSIS — I10 Essential (primary) hypertension: Secondary | ICD-10-CM

## 2021-10-05 LAB — CBC WITH DIFFERENTIAL/PLATELET
Basophils Absolute: 0 10*3/uL (ref 0.0–0.1)
Basophils Relative: 0.4 % (ref 0.0–3.0)
Eosinophils Absolute: 0.1 10*3/uL (ref 0.0–0.7)
Eosinophils Relative: 1.7 % (ref 0.0–5.0)
HCT: 36.7 % (ref 36.0–46.0)
Hemoglobin: 11.9 g/dL — ABNORMAL LOW (ref 12.0–15.0)
Lymphocytes Relative: 33.5 % (ref 12.0–46.0)
Lymphs Abs: 1.9 10*3/uL (ref 0.7–4.0)
MCHC: 32.4 g/dL (ref 30.0–36.0)
MCV: 82.1 fl (ref 78.0–100.0)
Monocytes Absolute: 0.4 10*3/uL (ref 0.1–1.0)
Monocytes Relative: 7.3 % (ref 3.0–12.0)
Neutro Abs: 3.3 10*3/uL (ref 1.4–7.7)
Neutrophils Relative %: 57.1 % (ref 43.0–77.0)
Platelets: 273 10*3/uL (ref 150.0–400.0)
RBC: 4.47 Mil/uL (ref 3.87–5.11)
RDW: 15.8 % — ABNORMAL HIGH (ref 11.5–15.5)
WBC: 5.8 10*3/uL (ref 4.0–10.5)

## 2021-10-05 LAB — LIPID PANEL
Cholesterol: 146 mg/dL (ref 0–200)
HDL: 42 mg/dL (ref >39.00)
LDL Cholesterol: 92 mg/dL (ref 0–99)
NonHDL: 104.33
Total CHOL/HDL Ratio: 3
Triglycerides: 60 mg/dL (ref 0.0–149.0)
VLDL: 12 mg/dL (ref 0.0–40.0)

## 2021-10-05 LAB — COMPREHENSIVE METABOLIC PANEL
ALT: 9 U/L (ref 0–35)
AST: 13 U/L (ref 0–37)
Albumin: 3.9 g/dL (ref 3.5–5.2)
Alkaline Phosphatase: 89 U/L (ref 39–117)
BUN: 16 mg/dL (ref 6–23)
CO2: 33 mEq/L — ABNORMAL HIGH (ref 19–32)
Calcium: 9.9 mg/dL (ref 8.4–10.5)
Chloride: 102 mEq/L (ref 96–112)
Creatinine, Ser: 1.04 mg/dL (ref 0.40–1.20)
GFR: 54.78 mL/min — ABNORMAL LOW (ref >60.00)
Glucose, Bld: 76 mg/dL (ref 70–99)
Potassium: 2.9 mEq/L — ABNORMAL LOW (ref 3.5–5.1)
Sodium: 142 mEq/L (ref 135–145)
Total Bilirubin: 0.4 mg/dL (ref 0.2–1.2)
Total Protein: 7.6 g/dL (ref 6.0–8.3)

## 2021-10-05 LAB — HEMOGLOBIN A1C: Hgb A1c MFr Bld: 5.7 % (ref 4.6–6.5)

## 2021-10-05 LAB — VITAMIN D 25 HYDROXY (VIT D DEFICIENCY, FRACTURES): VITD: 27.17 ng/mL — ABNORMAL LOW (ref 30.00–100.00)

## 2021-10-05 LAB — TSH: TSH: 1.45 u[IU]/mL (ref 0.35–5.50)

## 2021-10-05 MED ORDER — POTASSIUM CHLORIDE CRYS ER 10 MEQ PO TBCR: 10.0000 meq | EXTENDED_RELEASE_TABLET | Freq: Two times a day (BID) | ORAL | 1 refills | Status: DC

## 2021-10-05 NOTE — Telephone Encounter (Signed)
Called pt Informed her of low potassium and importance of taking Kcl regularly. She reports missing doses of kcl 10 meq QD. She feels well Increase kcl 10 meq BID and labs in one week She verbalized understanding

## 2021-10-09 ENCOUNTER — Other Ambulatory Visit: Payer: Self-pay

## 2021-10-09 DIAGNOSIS — I10 Essential (primary) hypertension: Secondary | ICD-10-CM

## 2021-10-10 ENCOUNTER — Ambulatory Visit
Admission: RE | Admit: 2021-10-10 | Discharge: 2021-10-10 | Disposition: A | Discharge status: Home / Self Care | Payer: Medicare Other | Source: Ambulatory Visit | Attending: Pain Medicine | Admitting: Pain Medicine

## 2021-10-10 DIAGNOSIS — M47817 Spondylosis without myelopathy or radiculopathy, lumbosacral region: Secondary | ICD-10-CM | POA: Insufficient documentation

## 2021-10-10 DIAGNOSIS — M4316 Spondylolisthesis, lumbar region: Secondary | ICD-10-CM | POA: Diagnosis not present

## 2021-10-10 DIAGNOSIS — M5136 Other intervertebral disc degeneration, lumbar region: Secondary | ICD-10-CM | POA: Insufficient documentation

## 2021-10-10 DIAGNOSIS — M545 Low back pain, unspecified: Secondary | ICD-10-CM | POA: Insufficient documentation

## 2021-10-10 DIAGNOSIS — M47816 Spondylosis without myelopathy or radiculopathy, lumbar region: Secondary | ICD-10-CM | POA: Insufficient documentation

## 2021-10-10 DIAGNOSIS — M159 Polyosteoarthritis, unspecified: Secondary | ICD-10-CM | POA: Insufficient documentation

## 2021-10-10 DIAGNOSIS — M4310 Spondylolisthesis, site unspecified: Secondary | ICD-10-CM | POA: Diagnosis not present

## 2021-10-10 DIAGNOSIS — M2578 Osteophyte, vertebrae: Secondary | ICD-10-CM | POA: Diagnosis not present

## 2021-10-10 DIAGNOSIS — G8929 Other chronic pain: Secondary | ICD-10-CM | POA: Diagnosis not present

## 2021-10-11 ENCOUNTER — Other Ambulatory Visit: Payer: Self-pay

## 2021-10-11 ENCOUNTER — Other Ambulatory Visit (INDEPENDENT_AMBULATORY_CARE_PROVIDER_SITE_OTHER): Payer: Medicare Other

## 2021-10-11 DIAGNOSIS — I10 Essential (primary) hypertension: Secondary | ICD-10-CM

## 2021-10-12 LAB — BASIC METABOLIC PANEL
BUN: 15 mg/dL (ref 6–23)
CO2: 30 mEq/L (ref 19–32)
Calcium: 9.1 mg/dL (ref 8.4–10.5)
Chloride: 104 mEq/L (ref 96–112)
Creatinine, Ser: 0.96 mg/dL (ref 0.40–1.20)
GFR: 60.3 mL/min (ref >60.00)
Glucose, Bld: 88 mg/dL (ref 70–99)
Potassium: 3.5 mEq/L (ref 3.5–5.1)
Sodium: 141 mEq/L (ref 135–145)

## 2021-10-17 ENCOUNTER — Encounter: Payer: Self-pay | Admitting: Family

## 2021-10-17 ENCOUNTER — Telehealth (INDEPENDENT_AMBULATORY_CARE_PROVIDER_SITE_OTHER): Payer: Commercial Managed Care - HMO | Admitting: Family

## 2021-10-17 VITALS — Ht 63.0 in

## 2021-10-17 DIAGNOSIS — U071 COVID-19: Secondary | ICD-10-CM

## 2021-10-17 MED ORDER — PREDNISONE 10 MG PO TABS: ORAL_TABLET | ORAL | 0 refills | Status: DC

## 2021-10-17 NOTE — Patient Instructions (Addendum)
We recommend you get the updated bivalent COVID-19 booster, at least 2 months after any prior doses. You may consider delaying a booster dose by 3 months from a prior episode of COVID-19 per the CDC.   You can find pharmacies that have this formulation in stock at [email or web address]   Start prednisone  Please let me know how you are doing

## 2021-10-17 NOTE — Progress Notes (Signed)
Virtual Visit via Video Note  I connected with@  on 10/17/21 at 10:30 AM EST by a video enabled telemedicine application and verified that I am speaking with the correct person using two identifiers.  Location patient: home Location provider:work  Persons participating in the virtual visit: patient, provider  I discussed the limitations of evaluation and management by telemedicine and the availability of in person appointments. The patient expressed understanding and agreed to proceed.   HPI:  Complains of cough x 5 weeks, slight better. Coughing is less.  Started dry , hacking mostly at night. She started to develop productive cough. She has been using nasal spray, cloricidin HBP without relief. Relief from cough with tussionex.   Endorses loss of taste and smell which she lost 1 week ago and reports at that time she has more virulent, frequent cough.  She had HA and since completely resolved.  she tested positive for COVID 4 days ago.   She had covid in the past and this course was 'no way near' as severe.   No fever, sob, wheezing, sinus pain, ear pain.   Sao2 95%.  She was seen video 09/24/2021 for bronchitis and prescribed Augmentin, benzonatate. No improvement from augmentin.   She hasnt start Wellbutrin but plans too once better.  She is compliant with mounjaro 2.5mg .   No h/o smoking or lung disease.   She has done well on prednisone in the past.   ROS: See pertinent positives and negatives per HPI.    EXAM:  VITALS per patient if applicable: Ht 5\' 3"  (1.6 m)    BMI 42.97 kg/m  BP Readings from Last 3 Encounters:  09/19/21 124/80  08/30/21 134/90  08/01/21 138/82   Wt Readings from Last 3 Encounters:  09/19/21 242 lb 9.6 oz (110 kg)  08/30/21 239 lb (108.4 kg)  08/01/21 239 lb (108.4 kg)    GENERAL: alert, oriented, appears well and in no acute distress  HEENT: atraumatic, conjunttiva clear, no obvious abnormalities on inspection of external nose and  ears  NECK: normal movements of the head and neck  LUNGS: on inspection no signs of respiratory distress, breathing rate appears normal, no obvious gross SOB, gasping or wheezing  CV: no obvious cyanosis  MS: moves all visible extremities without noticeable abnormality  PSYCH/NEURO: pleasant and cooperative, no obvious depression or anxiety, speech and thought processing grossly intact  ASSESSMENT AND PLAN:  Discussed the following assessment and plan:  Problem List Items Addressed This Visit       Other   COVID-19 - Primary    Cough x 5 weeks. Tested positive 4 days ago. She had no new symptoms and symptoms had actually been worse 2 weeks ago. No new symptoms on the day of testing and she tested for knowledge. Cough has improved. Afebrile. She declines antiviral for covid which we discussed at length and agreed that symptoms started well before the 5 day mark where we would normally initiate antiviral for covid. We agreed prednisone taper to see if lingering bronchitis continues to improve.  Counseled on increased appetite, irritability, insomnia on prednisone.  she declines CXR. Advised to have bivalent covid booster when symptoms resolve. She will let me know how she is doing. Consider trial of symbicort if no improvement with CXR.       Relevant Medications   predniSONE (DELTASONE) 10 MG tablet    -we discussed possible serious and likely etiologies, options for evaluation and workup, limitations of telemedicine visit vs in person  visit, treatment, treatment risks and precautions. Pt prefers to treat via telemedicine empirically rather then risking or undertaking an in person visit at this moment.  .   I discussed the assessment and treatment plan with the patient. The patient was provided an opportunity to ask questions and all were answered. The patient agreed with the plan and demonstrated an understanding of the instructions.   The patient was advised to call back or seek an  in-person evaluation if the symptoms worsen or if the condition fails to improve as anticipated.   Mable Paris, FNP

## 2021-10-17 NOTE — Assessment & Plan Note (Addendum)
Cough x 5 weeks. Tested positive 4 days ago. She had no new symptoms and symptoms had actually been worse 2 weeks ago. No new symptoms on the day of testing and she tested for knowledge. Cough has improved. Afebrile. She declines antiviral for covid which we discussed at length and agreed that symptoms started well before the 5 day mark where we would normally initiate antiviral for covid. We agreed prednisone taper to see if lingering bronchitis continues to improve.  Counseled on increased appetite, irritability, insomnia on prednisone.  she declines CXR. Advised to have bivalent covid booster when symptoms resolve. She will let me know how she is doing. Consider trial of symbicort if no improvement with CXR.

## 2021-10-18 ENCOUNTER — Encounter
Admission: RE | Admit: 2021-10-18 | Discharge: 2021-10-18 | Disposition: A | Discharge status: Home / Self Care | Payer: Medicare Other | Source: Ambulatory Visit | Attending: Orthopedic Surgery | Admitting: Orthopedic Surgery

## 2021-10-18 DIAGNOSIS — Z96642 Presence of left artificial hip joint: Secondary | ICD-10-CM | POA: Diagnosis not present

## 2021-10-18 DIAGNOSIS — T8484XA Pain due to internal orthopedic prosthetic devices, implants and grafts, initial encounter: Secondary | ICD-10-CM | POA: Insufficient documentation

## 2021-10-18 DIAGNOSIS — M659 Synovitis and tenosynovitis, unspecified: Secondary | ICD-10-CM | POA: Insufficient documentation

## 2021-10-18 DIAGNOSIS — Z96652 Presence of left artificial knee joint: Secondary | ICD-10-CM | POA: Diagnosis not present

## 2021-10-18 DIAGNOSIS — Z471 Aftercare following joint replacement surgery: Secondary | ICD-10-CM | POA: Diagnosis not present

## 2021-10-18 DIAGNOSIS — M25562 Pain in left knee: Secondary | ICD-10-CM | POA: Diagnosis not present

## 2021-10-18 MED ORDER — TECHNETIUM TC 99M MEDRONATE IV KIT
20.0000 | PACK | Freq: Once | INTRAVENOUS | Status: AC | PRN
  Administered 2021-10-18: 21.58 via INTRAVENOUS

## 2021-10-30 DIAGNOSIS — Z20822 Contact with and (suspected) exposure to covid-19: Secondary | ICD-10-CM | POA: Diagnosis not present

## 2021-10-30 DIAGNOSIS — Z03818 Encounter for observation for suspected exposure to other biological agents ruled out: Secondary | ICD-10-CM | POA: Diagnosis not present

## 2021-10-31 DIAGNOSIS — R6889 Other general symptoms and signs: Secondary | ICD-10-CM | POA: Diagnosis not present

## 2021-10-31 DIAGNOSIS — J019 Acute sinusitis, unspecified: Secondary | ICD-10-CM | POA: Diagnosis not present

## 2021-10-31 DIAGNOSIS — R051 Acute cough: Secondary | ICD-10-CM | POA: Diagnosis not present

## 2021-11-02 ENCOUNTER — Telehealth: Payer: Self-pay | Admitting: Family

## 2021-11-02 ENCOUNTER — Other Ambulatory Visit: Payer: Self-pay

## 2021-11-02 MED ORDER — VALACYCLOVIR HCL 500 MG PO TABS: ORAL_TABLET | ORAL | 1 refills | Status: DC

## 2021-11-02 NOTE — Telephone Encounter (Signed)
Patient is requesting a refill on her valacyclovir. Patient states she took it a long time ago.

## 2021-11-02 NOTE — Telephone Encounter (Signed)
I spoke to patient to let her know that I have sent in Valtrex for her since prescription was expired. Pt also so saw [facility] walk-in two days ago & due to cough as well as unable to clear infections with other antibiotics he gave her clindamycin. She was a little nervous to take. I advised that I did not think that the provider there would have prescribed if he did not think appropriate but I was willing to send to Margaret's covering physician for advice is she would like. Pt declined at this time.

## 2021-11-04 ENCOUNTER — Other Ambulatory Visit: Payer: Self-pay | Admitting: Family

## 2021-11-06 DIAGNOSIS — K222 Esophageal obstruction: Secondary | ICD-10-CM | POA: Diagnosis not present

## 2021-11-06 DIAGNOSIS — K224 Dyskinesia of esophagus: Secondary | ICD-10-CM | POA: Diagnosis not present

## 2021-11-06 DIAGNOSIS — K449 Diaphragmatic hernia without obstruction or gangrene: Secondary | ICD-10-CM | POA: Diagnosis not present

## 2021-11-06 DIAGNOSIS — R1314 Dysphagia, pharyngoesophageal phase: Secondary | ICD-10-CM | POA: Diagnosis not present

## 2021-11-06 DIAGNOSIS — K219 Gastro-esophageal reflux disease without esophagitis: Secondary | ICD-10-CM | POA: Diagnosis not present

## 2021-11-06 DIAGNOSIS — K5903 Drug induced constipation: Secondary | ICD-10-CM | POA: Diagnosis not present

## 2021-11-06 DIAGNOSIS — R053 Chronic cough: Secondary | ICD-10-CM | POA: Diagnosis not present

## 2021-11-07 ENCOUNTER — Encounter: Payer: Self-pay | Admitting: Family

## 2021-11-07 ENCOUNTER — Ambulatory Visit (INDEPENDENT_AMBULATORY_CARE_PROVIDER_SITE_OTHER): Payer: Medicare Other | Admitting: Family

## 2021-11-07 VITALS — Ht 63.0 in | Wt 237.0 lb

## 2021-11-07 DIAGNOSIS — K219 Gastro-esophageal reflux disease without esophagitis: Secondary | ICD-10-CM | POA: Diagnosis not present

## 2021-11-07 DIAGNOSIS — R053 Chronic cough: Secondary | ICD-10-CM | POA: Diagnosis not present

## 2021-11-07 DIAGNOSIS — I10 Essential (primary) hypertension: Secondary | ICD-10-CM

## 2021-11-07 MED ORDER — AMLODIPINE BESYLATE 5 MG PO TABS
5.0000 mg | ORAL_TABLET | Freq: Every day | ORAL | 1 refills | Status: DC
Start: 2021-11-07 — End: 2021-12-20

## 2021-11-07 NOTE — Progress Notes (Signed)
Virtual Visit via Video Note  I connected with@  on 11/07/21 at 12:00 PM EST by a video enabled telemedicine application and verified that I am speaking with the correct person using two identifiers.  Location patient: home Location provider:work  Persons participating in the virtual visit: patient, provider  I discussed the limitations of evaluation and management by telemedicine and the availability of in person appointments. The patient expressed understanding and agreed to proceed.   HPI: Chronic sporadic cough with onset 5 months.  Describes predominantly as dry and at night she has productive cough when she lays down. Associated with hoarseness. She has tenderness epigastric area when rubbing the area. Tenderness only there if 'pressing' and concerned there is a 'knot'.  She no longer has epigastric regurgitation and 'gas in my throat' and feels better managed on acidphex, pepcid.   She has resumed one cup of coffee and not sure if contributory.    No trouble swallowing, choking, vomiting, fever, sob,  leg swelling, exertional chest pain or pressure, numbness or tingling radiating to left arm or jaw, palpitations, dizziness, frequent headaches, changes in vision.   She is non smoker. No h/o asthma or CHF. Echo 12/20/2019  She is concerned this is from losartan. She is compliant with hctz 25mg .    consult with GI yesterday who recommended changing from losartan.  He advised to continue Aciphex 20 mg twice daily and famotidine  Obesity- started trulicity with weight loss. She feels she is tolerating medication.   She stopped wellbutrin due to cough and may return to medication at a later time     ROS: See pertinent positives and negatives per HPI.    EXAM:  VITALS per patient if applicable: Ht 5\' 3"  (1.6 m)    Wt 237 lb (107.5 kg)    BMI 41.98 kg/m  BP Readings from Last 3 Encounters:  09/19/21 124/80  08/30/21 134/90  08/01/21 138/82   Wt Readings from Last 3  Encounters:  11/07/21 237 lb (107.5 kg)  09/19/21 242 lb 9.6 oz (110 kg)  08/30/21 239 lb (108.4 kg)    GENERAL: alert, oriented, appears well and in no acute distress  HEENT: atraumatic, conjunttiva clear, no obvious abnormalities on inspection of external nose and ears  NECK: normal movements of the head and neck  LUNGS: on inspection no signs of respiratory distress, breathing rate appears normal, no obvious gross SOB, gasping or wheezing  CV: no obvious cyanosis  GI: No obvious mass appreciated epigastric area on video. ( Advised in person exam for this to be evaluated)  MS: moves all visible extremities without noticeable abnormality.  PSYCH/NEURO: pleasant and cooperative, no obvious depression or anxiety, speech and thought processing grossly intact  ASSESSMENT AND PLAN:  Discussed the following assessment and plan:  Problem List Items Addressed This Visit       Cardiovascular and Mediastinum   Essential hypertension    Losartan can cause cough in  3%. Trial stop losartan and start amlodipine.  Continue HCTZ 25 mg.  Patient will monitor blood pressure      Relevant Medications   amLODipine (NORVASC) 5 MG tablet     Digestive   GERD (gastroesophageal reflux disease)    Appears stable although question if GERD remains contributory. Continue to follow GI and regimen advised. Will follow.         Other   Cough - Primary    Chronic. No SOB. Trial stop losartan and if no resolve, again will recommend pulmonary  consult.Pending CXR and close follow up ( advised in person).       Relevant Orders   DG Chest 2 View    -we discussed possible serious and likely etiologies, options for evaluation and workup, limitations of telemedicine visit vs in person visit, treatment, treatment risks and precautions. Pt prefers to treat via telemedicine empirically rather then risking or undertaking an in person visit at this moment.  .   I discussed the assessment and treatment  plan with the patient. The patient was provided an opportunity to ask questions and all were answered. The patient agreed with the plan and demonstrated an understanding of the instructions.   The patient was advised to call back or seek an in-person evaluation if the symptoms worsen or if the condition fails to improve as anticipated.   Mable Paris, FNP

## 2021-11-07 NOTE — Assessment & Plan Note (Signed)
Losartan can cause cough in  3%. Trial stop losartan and start amlodipine.  Continue HCTZ 25 mg.  Patient will monitor blood pressure

## 2021-11-07 NOTE — Assessment & Plan Note (Addendum)
Chronic. No SOB. Trial stop losartan and if no resolve, again will recommend pulmonary consult.Pending CXR and close follow up ( advised in person).

## 2021-11-07 NOTE — Patient Instructions (Signed)
Trial stop losartan Start amlodipine Monitor blood pressure Continue HCTZ.  Please come in person for chest xray and for in person follow up; you may call to schedule this.

## 2021-11-07 NOTE — Assessment & Plan Note (Signed)
Appears stable although question if GERD remains contributory. Continue to follow GI and regimen advised. Will follow.

## 2021-11-08 ENCOUNTER — Other Ambulatory Visit: Payer: Medicare Other

## 2021-11-08 ENCOUNTER — Ambulatory Visit (INDEPENDENT_AMBULATORY_CARE_PROVIDER_SITE_OTHER): Payer: Medicare Other

## 2021-11-08 ENCOUNTER — Other Ambulatory Visit: Payer: Self-pay

## 2021-11-08 DIAGNOSIS — R053 Chronic cough: Secondary | ICD-10-CM | POA: Diagnosis not present

## 2021-11-08 DIAGNOSIS — R059 Cough, unspecified: Secondary | ICD-10-CM | POA: Diagnosis not present

## 2021-11-12 ENCOUNTER — Other Ambulatory Visit: Payer: Self-pay | Admitting: Family

## 2021-11-26 ENCOUNTER — Ambulatory Visit (INDEPENDENT_AMBULATORY_CARE_PROVIDER_SITE_OTHER): Payer: Medicare Other | Admitting: Family

## 2021-11-26 ENCOUNTER — Other Ambulatory Visit: Payer: Self-pay

## 2021-11-26 ENCOUNTER — Encounter: Payer: Self-pay | Admitting: Family

## 2021-11-26 VITALS — BP 120/72 | HR 72 | Temp 98.2°F | Ht 63.0 in | Wt 239.8 lb

## 2021-11-26 DIAGNOSIS — M25562 Pain in left knee: Secondary | ICD-10-CM | POA: Diagnosis not present

## 2021-11-26 DIAGNOSIS — R0981 Nasal congestion: Secondary | ICD-10-CM

## 2021-11-26 DIAGNOSIS — G8929 Other chronic pain: Secondary | ICD-10-CM | POA: Diagnosis not present

## 2021-11-26 DIAGNOSIS — R1013 Epigastric pain: Secondary | ICD-10-CM | POA: Diagnosis not present

## 2021-11-26 DIAGNOSIS — I10 Essential (primary) hypertension: Secondary | ICD-10-CM | POA: Diagnosis not present

## 2021-11-26 LAB — MICROALBUMIN / CREATININE URINE RATIO
Creatinine,U: 131 mg/dL
Microalb Creat Ratio: 0.6 mg/g (ref 0.0–30.0)
Microalb, Ur: 0.8 mg/dL (ref 0.0–1.9)

## 2021-11-26 MED ORDER — POTASSIUM CHLORIDE CRYS ER 10 MEQ PO TBCR: 10.0000 meq | EXTENDED_RELEASE_TABLET | Freq: Two times a day (BID) | ORAL | 1 refills | Status: DC

## 2021-11-26 MED ORDER — POTASSIUM CHLORIDE CRYS ER 10 MEQ PO TBCR: 10.0000 meq | EXTENDED_RELEASE_TABLET | Freq: Every day | ORAL | 1 refills | Status: DC

## 2021-11-26 NOTE — Progress Notes (Signed)
Subjective:    Patient ID: Jacqueline Delgado, female    DOB: August 03, 1951, 71 y.o.   MRN: [id number]  CC: Jacqueline Delgado is a 71 y.o. female who presents today for follow up.   HPI: Complains of knot in between breasts x 3 weeks. Unchanged in size.  Feels superficial. Non draining.  No changes to breasts.  No injury or heavy lifting  No trouble swallowing, unusual weight loss.    Cough has improved. Cough is rare now.  She has chronic thick congestion, PND. No fever, facial pain, head trauma, sob, ha, vision changes. Sputum is not sweet. Pillow case is not wet after sleep.  Chronic left knee pain- she reports Tramadol is not working for her. She is taking 800mg  ibuprofen prn with relief.   H/o left knee arthroplasty TKR 2021, right TKR 2012.  10/18/21 Bone scan with asymmetric radiotracer.   Trial stop losartan at last visit and started amlodipine 5mg . She remains on hctz 25mg , potassium chloride 10 meq qd.  Due for PCV20; she declines  Upper endoscopy 2019 . No gastritis HISTORY:  Past Medical History:  Diagnosis Date   Anemia    H/O   Anginal pain ([place])     3/8-12/21/10   Arthritis    Osteoarthritis in BLE knee   Colon polyp    GERD (gastroesophageal reflux disease)    Hiatal hernia    History of hiatal hernia    History of methicillin resistant staphylococcus aureus (MRSA)    Hypertension    controlled   Obesity    Reflux    Sleep apnea    NO CPAP-SLEEP STUDY FROM 2021 WAS NEGATIVE PER PT   Small vessel disease ([place])    Past Surgical History:  Procedure Laterality Date   ABDOMINAL HYSTERECTOMY     partial; per patient has left ovary   CHOLECYSTECTOMY     COLONOSCOPY W/ POLYPECTOMY     adenomatous colon polyp   COLONOSCOPY WITH PROPOFOL N/A 12/10/2017   Procedure: COLONOSCOPY WITH PROPOFOL;  Surgeon: Manya Silvas, MD;  Location: [facility] ENDOSCOPY;  Service: Endoscopy;  Laterality: N/A;   ESOPHAGOGASTRODUODENOSCOPY (EGD) WITH PROPOFOL N/A  10/11/2015   Procedure: ESOPHAGOGASTRODUODENOSCOPY (EGD) WITH PROPOFOL;  Surgeon: Manya Silvas, MD;  Location: [facility] ENDOSCOPY;  Service: Endoscopy;  Laterality: N/A;   ESOPHAGOGASTRODUODENOSCOPY (EGD) WITH PROPOFOL N/A 12/10/2017   Procedure: ESOPHAGOGASTRODUODENOSCOPY (EGD) WITH PROPOFOL;  Surgeon: Manya Silvas, MD;  Location: [facility] ENDOSCOPY;  Service: Endoscopy;  Laterality: N/A;   JOINT REPLACEMENT Right    knee, Oct 2012   JOINT REPLACEMENT     hopefully getting left partial knee replacement in July 2016   JOINT REPLACEMENT Left    03/06/21   SAVORY DILATION N/A 10/11/2015   Procedure: SAVORY DILATION;  Surgeon: Manya Silvas, MD;  Location: [facility] ENDOSCOPY;  Service: Endoscopy;  Laterality: N/A;   SHOULDER ARTHROSCOPY WITH ROTATOR CUFF REPAIR AND SUBACROMIAL DECOMPRESSION Right 06/14/2015   Procedure: SHOULDER ARTHROSCOPY WITH mini open ROTATOR CUFF REPAIR AND SUBACROMIAL DECOMPRESSION, release long head biceps tendon.;  Surgeon: Leanor Kail, MD;  Location: [facility] ORS;  Service: Orthopedics;  Laterality: Right;   TOTAL KNEE ARTHROPLASTY Left 03/06/2021   Procedure: TOTAL KNEE ARTHROPLASTY - Rachelle Hora to Assist;  Surgeon: Hessie Knows, MD;  Location: [facility] ORS;  Service: Orthopedics;  Laterality: Left;   Family History  Problem Relation Age of Onset   Breast cancer Mother 24   Hyperlipidemia Father    Heart disease Father  Stroke Father    Hypertension Father    Breast cancer Maternal Grandmother        young   Alcoholism Other        brother   Arthritis Other        parent   Diabetes Other        parent and other family member   Thyroid cancer Neg Hx     Allergies: Mobic [meloxicam] Current Outpatient Medications on File Prior to Visit  Medication Sig Dispense Refill   ACIPHEX 20 MG tablet TAKE 1 TABLET (20 MG TOTAL) BY MOUTH IN THE MORNING AND AT BEDTIME.(DAW 1-BRAND NAME) 180 tablet 1   amLODipine (NORVASC) 5 MG tablet Take 1 tablet  (5 mg total) by mouth daily. 90 tablet 1   ondansetron (ZOFRAN-ODT) 4 MG disintegrating tablet TAKE 1 TABLET BY MOUTH EVERY 8 HOURS AS NEEDED FOR NAUSEA AND VOMITING 30 tablet 1   traZODone (DESYREL) 50 MG tablet Take 1 tablet by mouth at bedtime as needed.     TRULICITY 4.5 IO/0.3TD SOPN Inject 4.5 mg into the skin once a week.     valACYclovir (VALTREX) 500 MG tablet Take 2 tablets (1,000 mg total) by mouth 2 (two) times daily for 3 days. asap w/in 24h sx onset. 30 tablet 1   buPROPion (WELLBUTRIN XL) 150 MG 24 hr tablet Take 1 tablet (150 mg total) by mouth daily. (Patient not taking: Reported on 11/26/2021) 90 tablet 1   hydrochlorothiazide (HYDRODIURIL) 25 MG tablet Take 1 tablet (25 mg total) by mouth daily. 90 tablet 3   No current facility-administered medications on file prior to visit.    Social History   Tobacco Use   Smoking status: Never   Smokeless tobacco: Never  Vaping Use   Vaping Use: Never used  Substance Use Topics   Alcohol use: No   Drug use: No    Review of Systems  Constitutional:  Negative for chills, fever and unexpected weight change.  HENT:  Positive for postnasal drip. Negative for congestion and trouble swallowing.   Respiratory:  Negative for cough.   Cardiovascular:  Negative for chest pain and palpitations.  Gastrointestinal:  Negative for nausea and vomiting.  Musculoskeletal:  Positive for arthralgias (left knee).     Objective:    BP 120/72 (BP Location: Left Arm, Patient Position: Sitting, Cuff Size: Large)    Pulse 72    Temp 98.2 F (36.8 C) (Oral)    Ht 5\' 3"  (1.6 m)    Wt 239 lb 12.8 oz (108.8 kg)    SpO2 97%    BMI 42.48 kg/m  BP Readings from Last 3 Encounters:  11/26/21 120/72  09/19/21 124/80  08/30/21 134/90   Wt Readings from Last 3 Encounters:  11/26/21 239 lb 12.8 oz (108.8 kg)  11/07/21 237 lb (107.5 kg)  09/19/21 242 lb 9.6 oz (110 kg)    Physical Exam Vitals reviewed.  Constitutional:      Appearance: She  is well-developed.  HENT:     Head: Normocephalic and atraumatic.     Right Ear: Hearing, tympanic membrane, ear canal and external ear normal. No decreased hearing noted. No drainage, swelling or tenderness. No middle ear effusion. No foreign body. Tympanic membrane is not erythematous or bulging.     Left Ear: Hearing, tympanic membrane, ear canal and external ear normal. No decreased hearing noted. No drainage, swelling or tenderness.  No middle ear effusion. No foreign body. Tympanic membrane is not erythematous or  bulging.     Nose: Nose normal. No rhinorrhea.     Right Sinus: No maxillary sinus tenderness or frontal sinus tenderness.     Left Sinus: No maxillary sinus tenderness or frontal sinus tenderness.     Mouth/Throat:     Pharynx: Uvula midline. No oropharyngeal exudate or posterior oropharyngeal erythema.     Tonsils: No tonsillar abscesses.  Eyes:     Conjunctiva/sclera: Conjunctivae normal.  Cardiovascular:     Rate and Rhythm: Regular rhythm.     Pulses: Normal pulses.     Heart sounds: Normal heart sounds.  Pulmonary:     Effort: Pulmonary effort is normal.     Breath sounds: Normal breath sounds. No wheezing, rhonchi or rales.  Chest:       Comments: Diffuse tenderness over xiphoid process.  Area is nonfluctuant, nonerythematous.  Area is not boggy.  No increased heat or discharge. No bulging. Lymphadenopathy:     Head:     Right side of head: No submental, submandibular, tonsillar, preauricular, posterior auricular or occipital adenopathy.     Left side of head: No submental, submandibular, tonsillar, preauricular, posterior auricular or occipital adenopathy.     Cervical: No cervical adenopathy.  Skin:    General: Skin is warm and dry.  Neurological:     Mental Status: She is alert.  Psychiatric:        Speech: Speech normal.        Behavior: Behavior normal.        Thought Content: Thought content normal.       Assessment & Plan:   Problem List Items  Addressed This Visit       Cardiovascular and Mediastinum   Essential hypertension - Primary    Chronic, stable.  Continue amlodipine 5mg , hctz 25mg , potassium chloride 10 meq qd.      Relevant Medications   potassium chloride (KLOR-CON M) 10 MEQ tablet   Other Relevant Orders   Urine Microalbumin w/creat. ratio     Other   Chronic knee pain (3ry area of Pain) (Left) (Chronic)    Chronic, worsening. Concerned by recent Bone scan and asymmetry noted which could be loosening of hardware or infection per report.  Advised patient the importance of prompt follow-up with orthopedics discern  the above.  Advised against ibuprofen 800mg  in the setting of previously poorly controlled GERD.  Encouraged scheduling Tylenol arthritis.  She reports understanding of this.      Epigastric pain    Appears superficial and reproducible on exam.  Exam does not support infection.  pending [place] misc to evaluate for mass,  abscess. Consider CT chest high resolution if [place] unrevealing, symptom continues.       Relevant Orders   [place] CHEST SOFT TISSUE   Nasal congestion    Chronic, improved since covid overall. Benign exam. Agreed ENT consult for further evaluation, consideration for allergy testing.  Advised Mucinex ( plain).       Relevant Orders   Ambulatory referral to ENT     I have discontinued Artina Minella. Hashemi's traMADol. I have also changed her potassium chloride. Additionally, I am having her maintain her hydrochlorothiazide, ondansetron, buPROPion, valACYclovir, Trulicity, traZODone, amLODipine, and Aciphex.   Meds ordered this encounter  Medications   potassium chloride (KLOR-CON M) 10 MEQ tablet    Sig: Take 1 tablet (10 mEq total) by mouth daily.    Dispense:  120 tablet    Refill:  1    Discontinue potassium chloride 10  meq BID    Order Specific Question:   Supervising Provider    Answer:   Crecencio Mc [[id number]]    Return precautions given.   Risks, benefits, and alternatives of the  medications and treatment plan prescribed today were discussed, and patient expressed understanding.   Education regarding symptom management and diagnosis given to patient on AVS.  Continue to follow with Burnard Hawthorne, FNP for routine health maintenance.   [name] and I agreed with plan.   Mable Paris, FNP

## 2021-11-26 NOTE — Assessment & Plan Note (Addendum)
Chronic, improved since covid overall. Benign exam. Agreed ENT consult for further evaluation, consideration for allergy testing.  Advised Mucinex ( plain).

## 2021-11-26 NOTE — Assessment & Plan Note (Signed)
Chronic, stable.  Continue amlodipine 5mg , hctz 25mg , potassium chloride 10 meq qd.

## 2021-11-26 NOTE — Assessment & Plan Note (Addendum)
Appears superficial and reproducible on exam.  Exam does not support infection.  pending [place] misc to evaluate for mass,  abscess. Consider CT chest high resolution if [place] unrevealing, symptom continues.

## 2021-11-26 NOTE — Assessment & Plan Note (Signed)
Chronic, worsening. Concerned by recent Bone scan and asymmetry noted which could be loosening of hardware or infection per report.  Advised patient the importance of prompt follow-up with orthopedics discern  the above.  Advised against ibuprofen 800mg  in the setting of previously poorly controlled GERD.  Encouraged scheduling Tylenol arthritis.  She reports understanding of this.

## 2021-11-26 NOTE — Patient Instructions (Addendum)
Please call [facility] right away to schedule follow up to specifically discuss bone scan. Please let me know if any problems in scheduling  prompt visit  Use ibuprofen VERY sparingly.  As discussed, let's start by scheduling Tylenol Arthritis which is a 650mg  tablet .   You may take 1-2 tablets every 8 hours ( scheduled) with maximum of 6 tablets per day.   For example , you could take two tablets in the morning ( 8am) and then two tablets again at 4pm.   Maximum daily dose of acetaminophen 4 g per day from all sources.  If you are taking another medication which includes acetaminophen (Tylenol) which may be in cough and cold preparations or pain medication such as Percocet, you will need to factor that into your total daily dose to be safe.  Please let me know if any questions   For congestion, as discussed I placed a referral to ENT.  You may also try over-the-counter Mucinex (plain).   Let us know if you dont hear back within a week in regards to an appointment being scheduled.

## 2021-11-28 ENCOUNTER — Other Ambulatory Visit: Payer: Self-pay | Admitting: Family

## 2021-11-28 DIAGNOSIS — R222 Localized swelling, mass and lump, trunk: Secondary | ICD-10-CM

## 2021-12-11 ENCOUNTER — Ambulatory Visit: Payer: Medicare Other

## 2021-12-17 ENCOUNTER — Other Ambulatory Visit: Payer: Self-pay | Admitting: Family

## 2021-12-20 ENCOUNTER — Other Ambulatory Visit: Payer: Self-pay

## 2021-12-20 ENCOUNTER — Ambulatory Visit
Admission: RE | Admit: 2021-12-20 | Discharge: 2021-12-20 | Disposition: A | Discharge status: Home / Self Care | Payer: Medicare Other | Source: Ambulatory Visit | Attending: Family | Admitting: Family

## 2021-12-20 ENCOUNTER — Telehealth: Payer: Self-pay | Admitting: Family

## 2021-12-20 DIAGNOSIS — R079 Chest pain, unspecified: Secondary | ICD-10-CM | POA: Diagnosis not present

## 2021-12-20 DIAGNOSIS — I10 Essential (primary) hypertension: Secondary | ICD-10-CM

## 2021-12-20 DIAGNOSIS — R222 Localized swelling, mass and lump, trunk: Secondary | ICD-10-CM | POA: Diagnosis not present

## 2021-12-20 MED ORDER — TRULICITY 4.5 MG/0.5ML ~~LOC~~ SOAJ: 4.5000 mg | SUBCUTANEOUS | 1 refills | Status: DC

## 2021-12-20 MED ORDER — LOSARTAN POTASSIUM 50 MG PO TABS: 50.0000 mg | ORAL_TABLET | Freq: Every evening | ORAL | 3 refills | Status: DC

## 2021-12-20 NOTE — Telephone Encounter (Signed)
Pt called in stating that she received a call from her insurance company [facility]). Pt stated that they advise her that she will need to get her medication (TRULICITY 4.5 RC/1.6LA SOPN) at a 90 supply instead of a month to month supply. Pt stated that the pharmacy advise her that she would need a new script for the 90 day supply for medication (TRULICITY 4.5 GT/3.6IW SOPN)the patient requesting callback ? ? ? ? ?

## 2021-12-20 NOTE — Telephone Encounter (Signed)
I spoke with patient & I have sent 90 supply of Trulicity to [facility] for her.  ?

## 2022-01-09 ENCOUNTER — Ambulatory Visit (INDEPENDENT_AMBULATORY_CARE_PROVIDER_SITE_OTHER): Payer: Medicare Other

## 2022-01-09 VITALS — BP 126/83 | HR 68 | Ht 63.0 in | Wt 247.0 lb

## 2022-01-09 DIAGNOSIS — Z0000 Encounter for general adult medical examination without abnormal findings: Secondary | ICD-10-CM

## 2022-01-09 NOTE — Progress Notes (Signed)
Subjective:   Jacqueline Delgado is a 71 y.o. female who presents for Medicare Annual (Subsequent) preventive examination.  Review of Systems    No ROS.  Medicare Wellness Virtual Visit.  Visual/audio telehealth visit, UTA vital signs.   See social history for additional risk factors.   Cardiac Risk Factors include: advanced age (>62men, >4 women)     Objective:    Today's Vitals   01/09/22 0933  BP: 126/83  Pulse: 68  Weight: 247 lb (112 kg)  Height: 5\' 3"  (1.6 m)   Body mass index is 43.75 kg/m.     01/09/2022    9:42 AM 03/06/2021    6:43 AM 09/19/2020    9:24 AM 07/27/2020    9:35 AM 07/18/2020    1:59 PM 06/09/2020   11:14 AM 04/16/2020   10:59 PM  Advanced Directives  Does Patient Have a Medical Advance Directive? No No No No No No No  Would patient like information on creating a medical advance directive? No - Patient declined No - Patient declined    No - Patient declined     Current Medications (verified) Outpatient Encounter Medications as of 01/09/2022  Medication Sig   ACIPHEX 20 MG tablet TAKE 1 TABLET (20 MG TOTAL) BY MOUTH IN THE MORNING AND AT BEDTIME.(DAW 1-BRAND NAME)   Dulaglutide (TRULICITY) 4.5 MG/0.5ML SOPN Inject 4.5 mg into the skin once a week.   losartan (COZAAR) 50 MG tablet Take 1 tablet (50 mg total) by mouth every evening.   ondansetron (ZOFRAN-ODT) 4 MG disintegrating tablet TAKE 1 TABLET BY MOUTH EVERY 8 HOURS AS NEEDED FOR NAUSEA AND VOMITING   potassium chloride (KLOR-CON M) 10 MEQ tablet Take 1 tablet (10 mEq total) by mouth daily.   traZODone (DESYREL) 50 MG tablet Take 1 tablet by mouth at bedtime as needed.   valACYclovir (VALTREX) 500 MG tablet TAKE 2 TABLETS BY MOUTH 2 TIMES DAILY FOR 3 DAYS. ASAP WITHIN 24 HOURS OF ONSET SYMTOMS.   buPROPion (WELLBUTRIN XL) 150 MG 24 hr tablet Take 1 tablet (150 mg total) by mouth daily. (Patient not taking: Reported on 11/26/2021)   hydrochlorothiazide (HYDRODIURIL) 25 MG tablet Take 1 tablet (25 mg  total) by mouth daily.   No facility-administered encounter medications on file as of 01/09/2022.    Allergies (verified) Mobic [meloxicam]   History: Past Medical History:  Diagnosis Date   Anemia    H/O   Anginal pain (HCC)     3/8-12/21/10   Arthritis    Osteoarthritis in BLE knee   Colon polyp    GERD (gastroesophageal reflux disease)    Hiatal hernia    History of hiatal hernia    History of methicillin resistant staphylococcus aureus (MRSA)    Hypertension    controlled   Obesity    Reflux    Sleep apnea    NO CPAP-SLEEP STUDY FROM 2021 WAS NEGATIVE PER PT   Small vessel disease (HCC)    Past Surgical History:  Procedure Laterality Date   ABDOMINAL HYSTERECTOMY     partial; per patient has left ovary   CHOLECYSTECTOMY     COLONOSCOPY W/ POLYPECTOMY     adenomatous colon polyp   COLONOSCOPY WITH PROPOFOL N/A 12/10/2017   Procedure: COLONOSCOPY WITH PROPOFOL;  Surgeon: Scot Jun, MD;  Location: [facility] ENDOSCOPY;  Service: Endoscopy;  Laterality: N/A;   ESOPHAGOGASTRODUODENOSCOPY (EGD) WITH PROPOFOL N/A 10/11/2015   Procedure: ESOPHAGOGASTRODUODENOSCOPY (EGD) WITH PROPOFOL;  Surgeon: Scot Jun, MD;  Location: [facility] ENDOSCOPY;  Service: Endoscopy;  Laterality: N/A;   ESOPHAGOGASTRODUODENOSCOPY (EGD) WITH PROPOFOL N/A 12/10/2017   Procedure: ESOPHAGOGASTRODUODENOSCOPY (EGD) WITH PROPOFOL;  Surgeon: Scot Jun, MD;  Location: [facility] ENDOSCOPY;  Service: Endoscopy;  Laterality: N/A;   JOINT REPLACEMENT Right    knee, Oct 2012   JOINT REPLACEMENT     hopefully getting left partial knee replacement in July 2016   JOINT REPLACEMENT Left    03/06/21   SAVORY DILATION N/A 10/11/2015   Procedure: SAVORY DILATION;  Surgeon: Scot Jun, MD;  Location: [facility] ENDOSCOPY;  Service: Endoscopy;  Laterality: N/A;   SHOULDER ARTHROSCOPY WITH ROTATOR CUFF REPAIR AND SUBACROMIAL DECOMPRESSION Right 06/14/2015   Procedure: SHOULDER ARTHROSCOPY WITH mini open ROTATOR  CUFF REPAIR AND SUBACROMIAL DECOMPRESSION, release long head biceps tendon.;  Surgeon: Erin Sons, MD;  Location: [facility] ORS;  Service: Orthopedics;  Laterality: Right;   TOTAL KNEE ARTHROPLASTY Left 03/06/2021   Procedure: TOTAL KNEE ARTHROPLASTY - [place] Neighbor to Assist;  Surgeon: Kennedy Bucker, MD;  Location: [facility] ORS;  Service: Orthopedics;  Laterality: Left;   Family History  Problem Relation Age of Onset   Breast cancer Mother 49   Hyperlipidemia Father    Heart disease Father    Stroke Father    Hypertension Father    Breast cancer Maternal Grandmother        young   Alcoholism Other        brother   Arthritis Other        parent   Diabetes Other        parent and other family member   Thyroid cancer Neg Hx    Social History   Socioeconomic History   Marital status: Single    Spouse name: Not on file   Number of children: Not on file   Years of education: Not on file   Highest education level: Not on file  Occupational History   Not on file  Tobacco Use   Smoking status: Never   Smokeless tobacco: Never  Vaping Use   Vaping Use: Never used  Substance and Sexual Activity   Alcohol use: No   Drug use: No   Sexual activity: Not on file  Other Topics Concern   Not on file  Social History Narrative   Lost a son on march 8th 20+ years.    March through Mother's Day is a very hard time every year.    Works with [employer] until 09/2020   Social Determinants of Health   Financial Resource Strain: Low Risk    Difficulty of Paying Living Expenses: Not hard at all  Food Insecurity: No Food Insecurity   Worried About Programme researcher, broadcasting/film/video in the Last Year: Never true   Barista in the Last Year: Never true  Transportation Needs: No Transportation Needs   Lack of Transportation (Medical): No   Lack of Transportation (Non-Medical): No  Physical Activity: Unknown   Days of Exercise per Week: 0 days   Minutes of Exercise per Session: Not on file  Stress: Stress  Concern Present   Feeling of Stress : To some extent  Social Connections: Unknown   Frequency of Communication with Friends and Family: More than three times a week   Frequency of Social Gatherings with Friends and Family: More than three times a week   Attends Religious Services: More than 4 times per year   Active Member of Clubs or Organizations: Not on file   Attends  Engineer, structural: Not on file   Marital Status: Not on file    Tobacco Counseling Counseling given: Not Answered   Clinical Intake:  Pre-visit preparation completed: Yes        Diabetes: Yes (Followed by PCP)  How often do you need to have someone help you when you read instructions, pamphlets, or other written materials from your doctor or pharmacy?: 1 - Never  Interpreter Needed?: No      Activities of Daily Living    01/09/2022    9:58 AM 03/06/2021    1:00 PM  In your present state of health, do you have any difficulty performing the following activities:  Hearing? 0 0  Vision? 0 0  Difficulty concentrating or making decisions? 0 0  Walking or climbing stairs? 0 1  Dressing or bathing? 0 0  Doing errands, shopping? 0 0  Preparing Food and eating ? N   Using the Toilet? N   In the past six months, have you accidently leaked urine? N   Do you have problems with loss of bowel control? N   Managing your Medications? N   Managing your Finances? N   Housekeeping or managing your Housekeeping? N     Patient Care Team: Allegra Grana, FNP as PCP - General (Family Medicine) Debbe Odea, MD as PCP - Cardiology (Cardiology)  Indicate any recent Medical Services you may have received from other than [facility] providers in the past year (date may be approximate).     Assessment:   This is a routine wellness examination for Shadimon.  Virtual Visit via Telephone Note  I connected with  TALIBAH MALLINSON on 01/09/22 at  9:30 AM EDT by telephone and verified that I am speaking with the  correct person using two identifiers.  Persons participating in the virtual visit: patient/Nurse Health Advisor   I discussed the limitations of performing an evaluation and management service by telehealth. The patient expressed understanding and agreed to proceed. have access to video capability.  We continued and completed visit with audio only.  Some vital signs may be absent or patient reported.   Hearing/Vision screen Hearing Screening - Comments:: Patient is able to hear conversational tones without difficulty. No issues reported. Tinnitus R ear Vision Screening - Comments:: Wears corrective lenses  They have seen their ophthalmologist, [facility].   Dietary issues and exercise activities discussed: Current Exercise Habits: The patient does not participate in regular exercise at presentHealthy diet Good water intake    Goals Addressed               This Visit's Progress     Patient Stated     Increase physical activity (pt-stated)        Use the treadmill for exercise       Depression Screen    01/09/2022    9:35 AM 09/19/2021    3:06 PM 06/20/2021    2:42 PM 04/23/2021    3:12 PM 04/23/2021    2:04 PM 02/21/2021    1:34 PM 12/13/2020    3:39 PM  PHQ 2/9 Scores  PHQ - 2 Score 6 3 0 1 1 0 6  PHQ- 9 Score 16 12  7   14     Fall Risk    01/09/2022    9:57 AM 08/30/2021   11:39 AM 08/01/2021    9:51 AM 06/20/2021    2:42 PM 05/23/2021    1:09 PM  Fall Risk  Falls in the past year? 0 0 0 1 1  Number falls in past yr: 0 0  0 0  Injury with Fall?    0 0  Risk for fall due to :  No Fall Risks  Orthopedic patient;History of fall(s)   Follow up Falls evaluation completed Falls evaluation completed  Falls evaluation completed Falls evaluation completed    FALL RISK PREVENTION PERTAINING TO THE HOME:  Home free of loose throw rugs in walkways, pet beds, electrical cords, etc? Yes  Adequate lighting in your home to reduce risk of falls? Yes   ASSISTIVE  DEVICES UTILIZED TO PREVENT FALLS: Life alert? No  Use of a cane, walker or w/c? No   TIMED UP AND GO: Was the test performed? No .   Cognitive Function:        05/13/2019    3:36 PM  6CIT Screen  What Year? 0 points  What month? 0 points  What time? 0 points  Count back from 20 0 points  Months in reverse 0 points    Immunizations Immunization History  Administered Date(s) Administered   Fluad Quad(high Dose 65+) 06/20/2021   Influenza Whole 05/23/2018   PFIZER Comirnaty(Gray Top)Covid-19 Tri-Sucrose Vaccine 03/07/2021, 11/13/2021   Pneumococcal Conjugate-13 12/28/2019   Tdap 11/02/2021   Unspecified SARS-COV-2 Vaccination 03/07/2021   TDAP status: Due, Education has been provided regarding the importance of this vaccine. Advised may receive this vaccine at local pharmacy or Health Dept. Aware to provide a copy of the vaccination record if obtained from local pharmacy or Health Dept. Verbalized acceptance and understanding. Deferred.   Screening Tests Health Maintenance  Topic Date Due   Zoster Vaccines- Shingrix (1 of 2) 04/11/2022 (Originally 02/10/1970)   DEXA SCAN  05/02/2022 (Originally 02/09/2017)   MAMMOGRAM  05/12/2023   COLONOSCOPY (Pts 45-23yrs Insurance coverage will need to be confirmed)  12/11/2027   TETANUS/TDAP  11/03/2031   INFLUENZA VACCINE  Completed   Hepatitis C Screening  Completed   HPV VACCINES  Aged Out   Pneumonia Vaccine 28+ Years old  Discontinued   COVID-19 Vaccine  Discontinued   Health Maintenance There are no preventive care reminders to display for this patient.  Deca Scan- deferred to complete with next Mammogram.   Lung Cancer Screening: (Low Dose CT Chest recommended if Age 63-80 years, 30 pack-year currently smoking OR have quit w/in 15years.) does not qualify.   Vision Screening: Recommended annual ophthalmology exams for early detection of glaucoma and other disorders of the eye.  Dental Screening: Recommended annual dental  exams for proper oral hygiene  Community Resource Referral / Chronic Care Management: CRR required this visit?  No   CCM required this visit?  No      Plan:   Keep all routine maintenance appointments.   Follow up with PCP 01/14/22 for depression follow up. Offered appointment today, declined. Patient notes presents no harm to self or others. Depression stems from not being able to secure employment. She has not been taking antidepressant medication due to it upsets her stomach. Plans to continue to hold until visit with PCP.   I have personally reviewed and noted the following in the patient's chart:   Medical and social history Use of alcohol, tobacco or illicit drugs  Current medications and supplements including opioid prescriptions.  Functional ability and status Nutritional status Physical activity Advanced directives List of other physicians Hospitalizations, surgeries, and ER visits in previous 12 months Vitals Screenings to include cognitive, depression, and  falls Referrals and appointments  In addition, I have reviewed and discussed with patient certain preventive protocols, quality metrics, and best practice recommendations. A written personalized care plan for preventive services as well as general preventive health recommendations were provided to patient via mychart.     Ashok Pall, LPN   1/61/0960

## 2022-01-09 NOTE — Patient Instructions (Addendum)
?  Ms. Trawick , ?Thank you for taking time to come for your Medicare Wellness Visit. I appreciate your ongoing commitment to your health goals. Please review the following plan we discussed and let me know if I can assist you in the future.  ? ?These are the goals we discussed: ? Goals   ? ?  ? Patient Stated  ?   Increase physical activity (pt-stated)   ?   Use the treadmill for exercise ?  ? ?  ?  ?This is a list of the screening recommended for you and due dates:  ?Health Maintenance  ?Topic Date Due  ? Zoster (Shingles) Vaccine (1 of 2) 04/11/2022*  ? DEXA scan (bone density measurement)  05/02/2022*  ? Mammogram  05/12/2023  ? Colon Cancer Screening  12/11/2027  ? Tetanus Vaccine  11/03/2031  ? Flu Shot  Completed  ? Hepatitis C Screening: USPSTF Recommendation to screen - Ages 13-79 yo.  Completed  ? HPV Vaccine  Aged Out  ? Pneumonia Vaccine  Discontinued  ? COVID-19 Vaccine  Discontinued  ?*Topic was postponed. The date shown is not the original due date.  ?  ?

## 2022-01-10 ENCOUNTER — Telehealth: Payer: Self-pay

## 2022-01-10 ENCOUNTER — Other Ambulatory Visit: Payer: Self-pay | Admitting: Family

## 2022-01-10 DIAGNOSIS — I10 Essential (primary) hypertension: Secondary | ICD-10-CM

## 2022-01-10 DIAGNOSIS — E662 Morbid (severe) obesity with alveolar hypoventilation: Secondary | ICD-10-CM

## 2022-01-10 NOTE — Telephone Encounter (Signed)
REFILL SENT TO PHARMACY  

## 2022-01-14 ENCOUNTER — Encounter: Payer: Self-pay | Admitting: Family

## 2022-01-14 ENCOUNTER — Telehealth (INDEPENDENT_AMBULATORY_CARE_PROVIDER_SITE_OTHER): Payer: Medicare Other | Admitting: Family

## 2022-01-14 VITALS — BP 127/74 | HR 66

## 2022-01-14 DIAGNOSIS — K219 Gastro-esophageal reflux disease without esophagitis: Secondary | ICD-10-CM | POA: Diagnosis not present

## 2022-01-14 DIAGNOSIS — R7303 Prediabetes: Secondary | ICD-10-CM

## 2022-01-14 DIAGNOSIS — M159 Polyosteoarthritis, unspecified: Secondary | ICD-10-CM | POA: Diagnosis not present

## 2022-01-14 DIAGNOSIS — I10 Essential (primary) hypertension: Secondary | ICD-10-CM

## 2022-01-14 DIAGNOSIS — F331 Major depressive disorder, recurrent, moderate: Secondary | ICD-10-CM

## 2022-01-14 DIAGNOSIS — Z1231 Encounter for screening mammogram for malignant neoplasm of breast: Secondary | ICD-10-CM | POA: Diagnosis not present

## 2022-01-14 DIAGNOSIS — J302 Other seasonal allergic rhinitis: Secondary | ICD-10-CM | POA: Diagnosis not present

## 2022-01-14 MED ORDER — CETIRIZINE HCL 10 MG PO TABS: 10.0000 mg | ORAL_TABLET | Freq: Every day | ORAL | 3 refills | Status: DC

## 2022-01-14 NOTE — Assessment & Plan Note (Signed)
Uncontrolled. She is currently looking for employment. Overeating. She will retrial wellbutrin '150mg'$  to see if nausea were to recur. Start trazodone '25mg'$  as well. Close follow up.  ?

## 2022-01-14 NOTE — Assessment & Plan Note (Signed)
Chronic, stable.  Continue Aciphex as managed by gastroenterology, Octavia Bruckner.  ?

## 2022-01-14 NOTE — Patient Instructions (Addendum)
You may start wellbutrin '150mg'$  daily for a couple of weeks prior to increasing to two tablets in the morning ( '300mg'$ ) ? ?Start trazodone '25mg'$  every night as helps with sleep and depression. You may increase to whole tablet after a couple of weeks.  ? ?We discussed today starting medication called Wellbutrin.  As also discussed, you must limit alcohol on this medication as alcohol and Wellbutrin together may increase your risk for seizure.  You may drink no more than 1 alcoholic beverage on this medication. ? A standard drink is 12 ounces of regular beer, which is usually about 5% alcohol ?OR ?5 ounces of wine, which is typically about 12% alcohol ?OR  ? 1.5 ounces of distilled spirits, which is about 40% alcohol ? ?Please call to schedule follow-up in 3 weeks time ? ? ? ?

## 2022-01-14 NOTE — Progress Notes (Signed)
Virtual Visit via Video Note ? ?I connected with@ ? on 01/14/22 at  8:30 AM EDT by a video enabled telemedicine application and verified that I am speaking with the correct person using two identifiers. ? Location patient: home ?Location provider:work  ?Persons participating in the virtual visit: patient, provider ? ?I discussed the limitations of evaluation and management by telemedicine and the availability of in person appointments. The patient expressed understanding and agreed to proceed. ? ? ?HPI: ? ?Worsening depression. She is looking for a job and unable to find a job which has been stressful. She is eating more as she is under stress. No Si/Hi ?She is not on wellbutrin. She takes trazodone '25mg'$  twice per week. She has struggle staying asleep.  ?She doesn't drink alcohol.  ? ?HTN- compliant with  losartan '50mg'$ , hctz '25mg'$ , potassium chloride 10 meq qd. No cp.  ? ?GERD- well controlled on aciphex. No epigastric pain.  ? ?Nasal congestion has nearly resolved on claritin. She would however like to try zyrtec and have a prescription for this.  ? ?Appointment with [facility] is scheduled for April per patient.  ? ? ?ROS: See pertinent positives and negatives per HPI. ? ? ? ?EXAM: ? ?VITALS per patient if applicable: ?BP 165/53   Pulse 66   SpO2 96%  ?BP Readings from Last 3 Encounters:  ?01/14/22 127/74  ?01/09/22 126/83  ?11/26/21 120/72  ? ?Wt Readings from Last 3 Encounters:  ?01/09/22 247 lb (112 kg)  ?11/26/21 239 lb 12.8 oz (108.8 kg)  ?11/07/21 237 lb (107.5 kg)  ? ? ?  01/14/2022  ?  8:37 AM 01/09/2022  ?  9:35 AM 09/19/2021  ?  3:06 PM  ?Depression screen PHQ 2/9  ?Decreased Interest '2 3 2  '$ ?Down, Depressed, Hopeless '2 3 1  '$ ?PHQ - 2 Score '4 6 3  '$ ?Altered sleeping '3 3 3  '$ ?Tired, decreased energy '1 3 2  '$ ?Change in appetite '3 3 1  '$ ?Feeling bad or failure about yourself  '1 1 2  '$ ?Trouble concentrating 0 0 1  ?Moving slowly or fidgety/restless 0 0 0  ?Suicidal thoughts 0 0 0  ?PHQ-9 Score '12 16 12  '$ ?Difficult  doing work/chores  Somewhat difficult Somewhat difficult  ? ? ? ?GENERAL: alert, oriented, appears well and in no acute distress ? ?HEENT: atraumatic, conjunttiva clear, no obvious abnormalities on inspection of external nose and ears ? ?NECK: normal movements of the head and neck ? ?LUNGS: on inspection no signs of respiratory distress, breathing rate appears normal, no obvious gross SOB, gasping or wheezing ? ?CV: no obvious cyanosis ? ?MS: moves all visible extremities without noticeable abnormality ? ?PSYCH/NEURO: pleasant and cooperative, no obvious depression or anxiety, speech and thought processing grossly intact ? ? ?ASSESSMENT AND PLAN: ? ?Discussed the following assessment and plan: ? ?Problem List Items Addressed This Visit   ? ?  ? Cardiovascular and Mediastinum  ? Essential hypertension - Primary  ?  Chronic stable. Continue losartan '50mg'$ , hctz '25mg'$ , potassium chloride 10 meq qd ?  ?  ? Relevant Orders  ? Comprehensive metabolic panel  ? Hemoglobin A1c  ? CBC with Differential/Platelet  ?  ? Digestive  ? GERD (gastroesophageal reflux disease)  ?  Chronic, stable.  Continue Aciphex as managed by gastroenterology, Octavia Bruckner.  ?  ?  ?  ? Musculoskeletal and Integument  ? Osteoarthritis involving multiple joints (Chronic)  ? Relevant Orders  ? DG Bone Density  ?  ? Other  ?  Depression, major, recurrent, moderate ([place])  ?  Uncontrolled. She is currently looking for employment. Overeating. She will retrial wellbutrin '150mg'$  to see if nausea were to recur. Start trazodone '25mg'$  as well. Close follow up.  ?  ?  ? Prediabetes  ? Relevant Orders  ? Hemoglobin A1c  ? ?Other Visit Diagnoses   ? ? Seasonal allergies      ? Relevant Medications  ? cetirizine (ZYRTEC) 10 MG tablet  ? Screening mammogram for breast cancer      ? Relevant Orders  ? MM 3D SCREEN BREAST BILATERAL  ? ?  ? ? ?-we discussed possible serious and likely etiologies, options for evaluation and workup, limitations of telemedicine visit vs in  person visit, treatment, treatment risks and precautions. Pt prefers to treat via telemedicine empirically rather then risking or undertaking an in person visit at this moment.  ?. ?  ?I discussed the assessment and treatment plan with the patient. The patient was provided an opportunity to ask questions and all were answered. The patient agreed with the plan and demonstrated an understanding of the instructions. ?  ?The patient was advised to call back or seek an in-person evaluation if the symptoms worsen or if the condition fails to improve as anticipated. ? ? ?Mable Paris, FNP  ?

## 2022-01-14 NOTE — Assessment & Plan Note (Signed)
Chronic stable. Continue losartan '50mg'$ , hctz '25mg'$ , potassium chloride 10 meq qd ?

## 2022-01-22 NOTE — Progress Notes (Signed)
PROVIDER NOTE: Information contained herein reflects review and annotations entered in association with encounter. Interpretation of such information and data should be left to medically-trained personnel. Information provided to patient can be located elsewhere in the medical record under "Patient Instructions". Document created using STT-dictation technology, any transcriptional errors that may result from process are unintentional.  ?  ?Patient: FARIHA GOTO  Service Category: E/M  Provider: Gaspar Cola, MD  ?DOB: 03/29/1951  DOS: 01/23/2022  Specialty: Interventional Pain Management  ?MRN: [id number]  Setting: Ambulatory outpatient  PCP: Burnard Hawthorne, FNP  ?Type: Established Patient    Referring Provider: Burnard Hawthorne, FNP  ?Location: Office  Delivery: Face-to-face    ? ?HPI  ?[name], a 71 y.o. year old female, is here today because of her Chronic pain syndrome [G89.4]. Ms. Wadley primary complain today is Back Pain (low) ?Last encounter: My last encounter with her was on 09/13/2021. ?Pertinent problems: Ms. Jared has Plantar fasciitis; Localized swelling of finger of left hand; Acquired trigger finger; Closed nondisplaced fracture of base of fifth metacarpal bone of right hand; Complete tear of right rotator cuff; DDD (degenerative disc disease), cervical; DDD (degenerative disc disease), lumbar; Chronic low back pain (1ry area of Pain) (Bilateral) (R>L) w/ sciatica (Right); Neck pain; Non-cardiac chest pain; S/P rotator cuff repair; Osteoarthritis, knee; Unspecified osteoarthritis, unspecified site; Osteoarthritis of knee (Left); Ankle swelling, right; Chronic pain syndrome; DDD (degenerative disc disease), lumbosacral; Lumbar facet hypertrophy; Abnormal MRI, lumbar spine (04/12/2013); Lumbosacral central spinal stenosis (9 mm) (L4-5 and L5-S1); Chronic lower extremity pain (Right); Lumbosacral radiculopathy at L5 (Right); Chronic knee pain (3ry area of Pain) (Left); Swelling of  foot (4th area of Pain) (Right); Chronic lower extremity pain (2ry area of Pain) (Bilateral) (R>L); Calcaneal spur of foot (Right); Lumbar Grade 1 Anterolisthesis of L4/L5; Lumbosacral facet syndrome (Bilateral) (R>L); Chronic low back pain (Bilateral) (R>L) w/o sciatica; Spondylosis without myelopathy or radiculopathy, lumbosacral region; Osteoarthritis of lumbar spine; and Osteoarthritis involving multiple joints on their pertinent problem list. ?Pain Assessment: Severity of   is reported as a 4 /10. Location: Back Lower/denies. Onset: More than a month ago. Quality: Aching. Timing: Intermittent. Modifying factor(s): sitting, medication, ES Tylenol, heat. ?Vitals:  height is '5\' 3"'$  (1.6 m) and weight is 238 lb (108 kg). Her temporal temperature is 97.7 ?F (36.5 ?C). Her blood pressure is 125/80 and her pulse is 82. Her respiration is 18 and oxygen saturation is 98%.  ? ?Reason for encounter: medication management.  The patient refers that she is currently not really using the tramadol.  She has lost weight and has been able to control her pain.  She indicates that when she stands for a prolonged period of time she gets pain that is across her lower back around the level of the PSIS, bilaterally.  This is likely to be due to the facet joints, but at this point she has it under control and refers that the only thing that she has to take is extra strength Tylenol to get the pain down.  Because of this she has not had to use her tramadol and refers having enough so that she does not any refills at this time. ? ?RTCB: PRN ? ?Pharmacotherapy Assessment  ?Analgesic: Tramadol 50 mg tablet, 1 tab p.o. twice daily (PRN) ?MME/day: 0-10 mg/day  ? ?Monitoring: ?[place] PMP: PDMP reviewed during this encounter.       ?Pharmacotherapy: No side-effects or adverse reactions reported. ?Compliance: No problems identified. ?Effectiveness: Clinically  acceptable. ? ?Hart Rochester RN  01/23/2022 12:34 PM  Signed ?Safety precautions to  be maintained throughout the outpatient stay will include: orient to surroundings, keep bed in low position, maintain call bell within reach at all times, provide assistance with transfer out of bed and ambulation.  ?   UDS:  ?Summary  ?Date Value Ref Range Status  ?05/22/2020 Note  Final  ?  Comment:  ?  ==================================================================== ?Compliance Drug Analysis, Ur ?==================================================================== ?Test                             Result       Flag       Units ? ?Drug Present and Declared for Prescription Verification ?  Tramadol                       >3268        EXPECTED   ng/mg creat ?  O-Desmethyltramadol            2372         EXPECTED   ng/mg creat ?  N-Desmethyltramadol            1509         EXPECTED   ng/mg creat ?   Source of tramadol is a prescription medication. O-desmethyltramadol ?   and N-desmethyltramadol are expected metabolites of tramadol. ? ?  Bupropion                      PRESENT      EXPECTED ?  Hydroxybupropion               PRESENT      EXPECTED ?   Hydroxybupropion is an expected metabolite of bupropion. ? ?Drug Present not Declared for Prescription Verification ?  Dextromethorphan               PRESENT      UNEXPECTED ?  Dextrorphan/Levorphanol        PRESENT      UNEXPECTED ?   Dextrorphan is an expected metabolite of dextromethorphan, an over- ?   the-counter or prescription cough suppressant. Dextrorphan cannot be ?   distinguished from the scheduled prescription medication levorphanol ?   by the method used for analysis. ? ?==================================================================== ?Test                      Result    Flag   Units      Ref Range ?  Creatinine              153              mg/dL      >=20 ?==================================================================== ?Declared Medications: ? The flagging and interpretation on this report are based on the ? following declared medications.   Unexpected results may arise from ? inaccuracies in the declared medications. ? ? **Note: The testing scope of this panel includes these medications: ? ? Bupropion (Wellbutrin) ? Tramadol (Ultram) ? ? **Note: The testing scope of this panel does not include the ? following reported medications: ? ? Famotidine (Pepcid) ? Hydrochlorothiazide (Hydrodiuril) ? Losartan (Cozaar) ? Meloxicam (Mobic) ? Mirabegron (Myrbetriq) ? Rabeprazole (Aciphex) ?==================================================================== ?For clinical consultation, please call [phone]. ?==================================================================== ?  ?  ? ?ROS  ?Constitutional: Denies any fever or chills ?Gastrointestinal: No reported hemesis, hematochezia, vomiting, or acute GI distress ?Musculoskeletal:  Denies any acute onset joint swelling, redness, loss of ROM, or weakness ?Neurological: No reported episodes of acute onset apraxia, aphasia, dysarthria, agnosia, amnesia, paralysis, loss of coordination, or loss of consciousness ? ?Medication Review  ?Dulaglutide, RABEprazole, acetaminophen, buPROPion, cetirizine, hydrochlorothiazide, losartan, ondansetron, potassium chloride, traZODone, and valACYclovir ? ?History Review  ?Allergy: Ms. Marzano is allergic to mobic [meloxicam]. ?Drug: Ms. Schumm  reports no history of drug use. ?Alcohol:  reports no history of alcohol use. ?Tobacco:  reports that she has never smoked. She has never used smokeless tobacco. ?Social: Ms. Witter  reports that she has never smoked. She has never used smokeless tobacco. She reports that she does not drink alcohol and does not use drugs. ?Medical:  has a past medical history of Anemia, Anginal pain ([place]), Arthritis, Colon polyp, GERD (gastroesophageal reflux disease), Hiatal hernia, History of hiatal hernia, History of methicillin resistant staphylococcus aureus (MRSA), Hypertension, Obesity, Reflux, Sleep apnea, and Small vessel disease  ([place]). ?Surgical: Ms. Warf  has a past surgical history that includes Joint replacement (Right); Joint replacement; Cholecystectomy; Abdominal hysterectomy; Shoulder arthroscopy with rotator cuff repair and subacromial

## 2022-01-23 ENCOUNTER — Encounter: Payer: Self-pay | Admitting: Pain Medicine

## 2022-01-23 ENCOUNTER — Other Ambulatory Visit: Payer: Self-pay

## 2022-01-23 ENCOUNTER — Ambulatory Visit: Payer: Medicare Other | Attending: Pain Medicine | Admitting: Pain Medicine

## 2022-01-23 VITALS — BP 125/80 | HR 82 | Temp 97.7°F | Resp 18 | Ht 63.0 in | Wt 238.0 lb

## 2022-01-23 DIAGNOSIS — G8929 Other chronic pain: Secondary | ICD-10-CM | POA: Diagnosis not present

## 2022-01-23 DIAGNOSIS — Z79891 Long term (current) use of opiate analgesic: Secondary | ICD-10-CM | POA: Diagnosis not present

## 2022-01-23 DIAGNOSIS — M47817 Spondylosis without myelopathy or radiculopathy, lumbosacral region: Secondary | ICD-10-CM

## 2022-01-23 DIAGNOSIS — M5441 Lumbago with sciatica, right side: Secondary | ICD-10-CM | POA: Diagnosis not present

## 2022-01-23 DIAGNOSIS — M79604 Pain in right leg: Secondary | ICD-10-CM | POA: Insufficient documentation

## 2022-01-23 DIAGNOSIS — M25562 Pain in left knee: Secondary | ICD-10-CM | POA: Insufficient documentation

## 2022-01-23 DIAGNOSIS — G894 Chronic pain syndrome: Secondary | ICD-10-CM

## 2022-01-23 DIAGNOSIS — M7989 Other specified soft tissue disorders: Secondary | ICD-10-CM

## 2022-01-23 DIAGNOSIS — M79605 Pain in left leg: Secondary | ICD-10-CM | POA: Insufficient documentation

## 2022-01-23 DIAGNOSIS — Z79899 Other long term (current) drug therapy: Secondary | ICD-10-CM | POA: Diagnosis not present

## 2022-01-23 NOTE — Progress Notes (Signed)
Safety precautions to be maintained throughout the outpatient stay will include: orient to surroundings, keep bed in low position, maintain call bell within reach at all times, provide assistance with transfer out of bed and ambulation.  

## 2022-01-23 NOTE — Patient Instructions (Signed)
____________________________________________________________________________________________ ? ?Medication Rules ? ?Purpose: To inform patients, and their family members, of our rules and regulations. ? ?Applies to: All patients receiving prescriptions (written or electronic). ? ?Pharmacy of record: Pharmacy where electronic prescriptions will be sent. If written prescriptions are taken to a different pharmacy, please inform the nursing staff. The pharmacy listed in the electronic medical record should be the one where you would like electronic prescriptions to be sent. ? ?Electronic prescriptions: In compliance with the [place] Strengthen Opioid Misuse Prevention (STOP) Act of 2017 (Session Law 2017-74/H243), effective October 14, 2018, all controlled substances must be electronically prescribed. Calling prescriptions to the pharmacy will cease to exist. ? ?Prescription refills: Only during scheduled appointments. Applies to all prescriptions. ? ?NOTE: The following applies primarily to controlled substances (Opioid* Pain Medications).  ? ?Type of encounter (visit): For patients receiving controlled substances, face-to-face visits are required. (Not an option or up to the patient.) ? ?Patient's responsibilities: ?Pain Pills: Bring all pain pills to every appointment (except for procedure appointments). ?Pill Bottles: Bring pills in original pharmacy bottle. Always bring the newest bottle. Bring bottle, even if empty. ?Medication refills: You are responsible for knowing and keeping track of what medications you take and those you need refilled. ?The day before your appointment: write a list of all prescriptions that need to be refilled. ?The day of the appointment: give the list to the admitting nurse. Prescriptions will be written only during appointments. No prescriptions will be written on procedure days. ?If you forget a medication: it will not be "Called in", "Faxed", or "electronically sent". You will  need to get another appointment to get these prescribed. ?No early refills. Do not call asking to have your prescription filled early. ?Prescription Accuracy: You are responsible for carefully inspecting your prescriptions before leaving our office. Have the discharge nurse carefully go over each prescription with you, before taking them home. Make sure that your name is accurately spelled, that your address is correct. Check the name and dose of your medication to make sure it is accurate. Check the number of pills, and the written instructions to make sure they are clear and accurate. Make sure that you are given enough medication to last until your next medication refill appointment. ?Taking Medication: Take medication as prescribed. When it comes to controlled substances, taking less pills or less frequently than prescribed is permitted and encouraged. ?Never take more pills than instructed. ?Never take medication more frequently than prescribed.  ?Inform other Doctors: Always inform, all of your healthcare providers, of all the medications you take. ?Pain Medication from other Providers: You are not allowed to accept any additional pain medication from any other Doctor or Healthcare provider. There are two exceptions to this rule. (see below) In the event that you require additional pain medication, you are responsible for notifying us, as stated below. ?Cough Medicine: Often these contain an opioid, such as codeine or hydrocodone. Never accept or take cough medicine containing these opioids if you are already taking an opioid* medication. The combination may cause respiratory failure and death. ?Medication Agreement: You are responsible for carefully reading and following our Medication Agreement. This must be signed before receiving any prescriptions from our practice. Safely store a copy of your signed Agreement. Violations to the Agreement will result in no further prescriptions. (Additional copies of our  Medication Agreement are available upon request.) ?Laws, Rules, & Regulations: All patients are expected to follow all Federal and State Laws, Statutes, Rules, & Regulations. Ignorance of   the Laws does not constitute a valid excuse.  ?Illegal drugs and Controlled Substances: The use of illegal substances (including, but not limited to marijuana and its derivatives) and/or the illegal use of any controlled substances is strictly prohibited. Violation of this rule may result in the immediate and permanent discontinuation of any and all prescriptions being written by our practice. The use of any illegal substances is prohibited. ?Adopted CDC guidelines & recommendations: Target dosing levels will be at or below 60 MME/day. Use of benzodiazepines** is not recommended. ? ?Exceptions: There are only two exceptions to the rule of not receiving pain medications from other Healthcare Providers. ?Exception #1 (Emergencies): In the event of an emergency (i.e.: accident requiring emergency care), you are allowed to receive additional pain medication. However, you are responsible for: As soon as you are able, call our office [phone], at any time of the day or night, and leave a message stating your name, the date and nature of the emergency, and the name and dose of the medication prescribed. In the event that your call is answered by a member of our staff, make sure to document and save the date, time, and the name of the person that took your information.  ?Exception #2 (Planned Surgery): In the event that you are scheduled by another doctor or dentist to have any type of surgery or procedure, you are allowed (for a period no longer than 30 days), to receive additional pain medication, for the acute post-op pain. However, in this case, you are responsible for picking up a copy of our "Post-op Pain Management for Surgeons" handout, and giving it to your surgeon or dentist. This document is available at our office, and  does not require an appointment to obtain it. Simply go to our office during business hours (Monday-Thursday from 8:00 AM to 4:00 PM) (Friday 8:00 AM to 12:00 Noon) or if you have a scheduled appointment with us, prior to your surgery, and ask for it by name. In addition, you are responsible for: calling our office [phone], at any time of the day or night, and leaving a message stating your name, name of your surgeon, type of surgery, and date of procedure or surgery. Failure to comply with your responsibilities may result in termination of therapy involving the controlled substances. ?Medication Agreement Violation. Following the above rules, including your responsibilities will help you in avoiding a Medication Agreement Violation (?Breaking your Pain Medication Contract?). ? ?*Opioid medications include: morphine, codeine, oxycodone, oxymorphone, hydrocodone, hydromorphone, meperidine, tramadol, tapentadol, buprenorphine, fentanyl, methadone. ?**Benzodiazepine medications include: diazepam (Valium), alprazolam (Xanax), clonazepam (Klonopine), lorazepam (Ativan), clorazepate (Tranxene), chlordiazepoxide (Librium), estazolam (Prosom), oxazepam (Serax), temazepam (Restoril), triazolam (Halcion) ?(Last updated: 07/11/2021) ?____________________________________________________________________________________________ ? ____________________________________________________________________________________________ ? ?Medication Recommendations and Reminders ? ?Applies to: All patients receiving prescriptions (written and/or electronic). ? ?Medication Rules & Regulations: These rules and regulations exist for your safety and that of others. They are not flexible and neither are we. Dismissing or ignoring them will be considered "non-compliance" with medication therapy, resulting in complete and irreversible termination of such therapy. (See document titled "Medication Rules" for more details.) In all conscience,  because of safety reasons, we cannot continue providing a therapy where the patient does not follow instructions. ? ?Pharmacy of record:  ?Definition: This is the pharmacy where your electronic prescriptions w

## 2022-03-08 ENCOUNTER — Ambulatory Visit (INDEPENDENT_AMBULATORY_CARE_PROVIDER_SITE_OTHER): Payer: Medicare Other | Admitting: Family

## 2022-03-08 ENCOUNTER — Encounter: Payer: Self-pay | Admitting: Family

## 2022-03-08 DIAGNOSIS — F331 Major depressive disorder, recurrent, moderate: Secondary | ICD-10-CM

## 2022-03-08 DIAGNOSIS — Z6841 Body Mass Index (BMI) 40.0 and over, adult: Secondary | ICD-10-CM | POA: Diagnosis not present

## 2022-03-08 MED ORDER — FLUOXETINE HCL 20 MG PO CAPS: 20.0000 mg | ORAL_CAPSULE | Freq: Every morning | ORAL | 3 refills | Status: DC

## 2022-03-08 MED ORDER — METFORMIN HCL ER 500 MG PO TB24: 500.0000 mg | ORAL_TABLET | Freq: Every evening | ORAL | 2 refills | Status: DC

## 2022-03-08 NOTE — Progress Notes (Signed)
Verbal consent for services obtained from patient prior to services given to TELEPHONE visit:   Location of call:  provider at work patient at home  Names of all persons present for services: Mable Paris, NP and patient Chief complaint:   Depression-compliant with Wellbutrin 300 mg, trazodone 25 mg. She feels more irritable on wellbutrin '300mg'$ . Endorses binge eating when more depressed. She has gained weight when depression. She was on prozac years ago when son passed away. No si/hi  Compliant with trulicity 4.'5mg'$   however weight has plateaued.   H/o prediabetes  Wt Readings from Last 3 Encounters:  03/08/22 251 lb 12.8 oz (114.2 kg)  01/23/22 238 lb (108 kg)  01/09/22 247 lb (112 kg)    A/P/next steps:  Problem List Items Addressed This Visit       Other   Depression, major, recurrent, moderate ([place])    Uncontrolled. Wean off wellbutrin. Continue trazodone '25mg'$ . Start prozac '20mg'$ .  Referral to counseling. Close follow up       Relevant Medications   FLUoxetine (PROZAC) 20 MG capsule   Other Relevant Orders   Ambulatory referral to Psychology   Morbid obesity ([place]) - Primary   Relevant Medications   metFORMIN (GLUCOPHAGE-XR) 500 MG 24 hr tablet   Morbid obesity with BMI of 40.0-44.9, adult [facility])    Lab Results  Component Value Date   HGBA1C 5.7 10/05/2021  Weight gain. H/o prediabetes.  Continue trulicity 4.'5mg'$ . Start metformin and titrate. Counseled on side effects. Close follow up.        Relevant Medications   metFORMIN (GLUCOPHAGE-XR) 500 MG 24 hr tablet     I spent 15 min  discussing plan of care over the phone.

## 2022-03-08 NOTE — Assessment & Plan Note (Signed)
Uncontrolled. Wean off wellbutrin. Continue trazodone '25mg'$ . Start prozac '20mg'$ .  Referral to counseling. Close follow up

## 2022-03-08 NOTE — Assessment & Plan Note (Signed)
Lab Results  Component Value Date   HGBA1C 5.7 10/05/2021   Weight gain. H/o prediabetes.  Continue trulicity 4.'5mg'$ . Start metformin and titrate. Counseled on side effects. Close follow up.

## 2022-03-08 NOTE — Patient Instructions (Addendum)
Wean off wellbutrin by taking '150mg'$  for 4-5 days, then you may stop medication all together. Start prozac while weaning off of wellbutrin.   After you are on prozac for a period of time, then you may start metformin  Referral for counseling Let us know if you dont hear back within a week in regards to an appointment being scheduled.   Lets trial metformin  Start metformin XR with one '500mg'$  tablet at night. After one week, you may increase to two tablets at night ( total of '1000mg'$ ) . The third week, you may take take two tablets at night and one tablet in the morning.  The fourth week, you may take two tablets in the morning ( '1000mg'$  total) and two tablets at night ('1000mg'$  total). This will bring you to a maximum daily dose of '2000mg'$ /day which is maximum dose. Along the way, if you want to increase more slowly, please do as this medication can cause GI discomfort and loose stools which usually get better with time , however some patients find that they can only tolerate a certain dose and cannot increase to maximum dose.   Please let me know how you are doing

## 2022-03-13 ENCOUNTER — Other Ambulatory Visit (INDEPENDENT_AMBULATORY_CARE_PROVIDER_SITE_OTHER): Payer: Medicare Other

## 2022-03-13 DIAGNOSIS — I10 Essential (primary) hypertension: Secondary | ICD-10-CM

## 2022-03-13 DIAGNOSIS — R7303 Prediabetes: Secondary | ICD-10-CM

## 2022-03-13 LAB — CBC WITH DIFFERENTIAL/PLATELET
Basophils Absolute: 0 10*3/uL (ref 0.0–0.1)
Basophils Relative: 0.6 % (ref 0.0–3.0)
Eosinophils Absolute: 0.1 10*3/uL (ref 0.0–0.7)
Eosinophils Relative: 2.1 % (ref 0.0–5.0)
HCT: 36.5 % (ref 36.0–46.0)
Hemoglobin: 11.8 g/dL — ABNORMAL LOW (ref 12.0–15.0)
Lymphocytes Relative: 36 % (ref 12.0–46.0)
Lymphs Abs: 1.6 10*3/uL (ref 0.7–4.0)
MCHC: 32.5 g/dL (ref 30.0–36.0)
MCV: 82.8 fl (ref 78.0–100.0)
Monocytes Absolute: 0.4 10*3/uL (ref 0.1–1.0)
Monocytes Relative: 9.3 % (ref 3.0–12.0)
Neutro Abs: 2.4 10*3/uL (ref 1.4–7.7)
Neutrophils Relative %: 52 % (ref 43.0–77.0)
Platelets: 208 10*3/uL (ref 150.0–400.0)
RBC: 4.41 Mil/uL (ref 3.87–5.11)
RDW: 14.8 % (ref 11.5–15.5)
WBC: 4.6 10*3/uL (ref 4.0–10.5)

## 2022-03-13 LAB — COMPREHENSIVE METABOLIC PANEL
ALT: 7 U/L (ref 0–35)
AST: 11 U/L (ref 0–37)
Albumin: 4 g/dL (ref 3.5–5.2)
Alkaline Phosphatase: 97 U/L (ref 39–117)
BUN: 18 mg/dL (ref 6–23)
CO2: 29 mEq/L (ref 19–32)
Calcium: 9.8 mg/dL (ref 8.4–10.5)
Chloride: 103 mEq/L (ref 96–112)
Creatinine, Ser: 0.99 mg/dL (ref 0.40–1.20)
GFR: 57.94 mL/min — ABNORMAL LOW (ref >60.00)
Glucose, Bld: 75 mg/dL (ref 70–99)
Potassium: 3.7 mEq/L (ref 3.5–5.1)
Sodium: 141 mEq/L (ref 135–145)
Total Bilirubin: 0.6 mg/dL (ref 0.2–1.2)
Total Protein: 6.9 g/dL (ref 6.0–8.3)

## 2022-03-13 LAB — HEMOGLOBIN A1C: Hgb A1c MFr Bld: 5.5 % (ref 4.6–6.5)

## 2022-03-15 ENCOUNTER — Other Ambulatory Visit: Payer: Medicare Other

## 2022-03-17 ENCOUNTER — Other Ambulatory Visit: Payer: Self-pay | Admitting: Family

## 2022-03-17 DIAGNOSIS — D649 Anemia, unspecified: Secondary | ICD-10-CM

## 2022-03-25 ENCOUNTER — Other Ambulatory Visit: Payer: Self-pay | Admitting: Family

## 2022-03-25 DIAGNOSIS — K219 Gastro-esophageal reflux disease without esophagitis: Secondary | ICD-10-CM

## 2022-03-26 ENCOUNTER — Telehealth: Payer: Self-pay | Admitting: Family

## 2022-03-26 NOTE — Telephone Encounter (Signed)
Continue note, she is not sure if Medformin or another medication is making her feel sick.

## 2022-03-26 NOTE — Telephone Encounter (Signed)
Spoke to patient about her note. Patient stated that she believes that  the metformin is making her sick. Patient stated that she is nauseated . She has stomach pain and blotting? Wants to know what she should do. Patient stated that she is not going to take it anymore until she hears back from you?

## 2022-03-26 NOTE — Telephone Encounter (Signed)
Patient says that one of her medications, either metFORMIN (GLUCOPHAGE-XR) 500 MG 24 hr tablet  or   is making her sick. She is nausea , blotting , stomach pain.

## 2022-03-27 DIAGNOSIS — M25512 Pain in left shoulder: Secondary | ICD-10-CM | POA: Diagnosis not present

## 2022-03-27 DIAGNOSIS — I776 Arteritis, unspecified: Secondary | ICD-10-CM | POA: Diagnosis not present

## 2022-03-27 DIAGNOSIS — T8484XA Pain due to internal orthopedic prosthetic devices, implants and grafts, initial encounter: Secondary | ICD-10-CM | POA: Diagnosis not present

## 2022-03-27 DIAGNOSIS — M7652 Patellar tendinitis, left knee: Secondary | ICD-10-CM | POA: Diagnosis not present

## 2022-03-27 DIAGNOSIS — Z96652 Presence of left artificial knee joint: Secondary | ICD-10-CM | POA: Diagnosis not present

## 2022-03-27 DIAGNOSIS — M19012 Primary osteoarthritis, left shoulder: Secondary | ICD-10-CM | POA: Diagnosis not present

## 2022-03-27 DIAGNOSIS — M75102 Unspecified rotator cuff tear or rupture of left shoulder, not specified as traumatic: Secondary | ICD-10-CM | POA: Diagnosis not present

## 2022-03-27 NOTE — Telephone Encounter (Signed)
Call pt  She may stop metformin She will need to sch f/u to discuss next steps

## 2022-03-27 NOTE — Addendum Note (Signed)
Addended by: Burnard Hawthorne on: 03/27/2022 12:10 PM   Modules accepted: Orders

## 2022-03-28 NOTE — Telephone Encounter (Signed)
Spoke to patient and informed her that she could stop the metformin and scheduled her a follow up mychart vv for 04/10/22

## 2022-04-10 ENCOUNTER — Telehealth (INDEPENDENT_AMBULATORY_CARE_PROVIDER_SITE_OTHER): Payer: Medicare Other | Admitting: Family

## 2022-04-10 ENCOUNTER — Encounter: Payer: Self-pay | Admitting: Family

## 2022-04-10 DIAGNOSIS — F331 Major depressive disorder, recurrent, moderate: Secondary | ICD-10-CM

## 2022-04-10 DIAGNOSIS — Z6841 Body Mass Index (BMI) 40.0 and over, adult: Secondary | ICD-10-CM

## 2022-04-10 MED ORDER — OZEMPIC (0.25 OR 0.5 MG/DOSE) 2 MG/1.5ML ~~LOC~~ SOPN: 0.2500 mg | PEN_INJECTOR | SUBCUTANEOUS | 3 refills | Status: DC

## 2022-04-10 NOTE — Patient Instructions (Signed)
Start ozempic 0.67m once per week once per week injected subcutaneously ( [place])  in stomach. Please clean with alcohol swab prior to injection and be sure to rotate site. You may schedule a nurse visit if you would like to first injection.   After 4 weeks, and if tolerated and weight loss has not reached 1-2 lbs per week, please increase to 0.574monce per week .    Please read information on medication below and remember black box warning that you may not take if you or a family member is diagnosed with thyroid cancer (medullary thyroid cancer), or multiple endocrine neoplasia ( MEN).       Semaglutide injection solution What is this medicine? SEMAGLUTIDE (Sem a GLOO tide) is used to improve blood sugar control in adults with type 2 diabetes. This medicine may be used with other diabetes medicines. This drug may also reduce the risk of heart attack or stroke if you have type 2 diabetes and risk factors for heart disease. This medicine may be used for other purposes; ask your health care provider or pharmacist if you have questions. COMMON BRAND NAME(S): OZEMPIC What should I tell my health care provider before I take this medicine? They need to know if you have any of these conditions: endocrine tumors (MEN 2) or if someone in your family had these tumors eye disease, vision problems history of pancreatitis kidney disease stomach problems thyroid cancer or if someone in your family had thyroid cancer an unusual or allergic reaction to semaglutide, other medicines, foods, dyes, or preservatives pregnant or trying to get pregnant breast-feeding How should I use this medicine? This medicine is for injection under the skin of your upper leg (thigh), stomach area, or upper arm. It is given once every week (every 7 days). You will be taught how to prepare and give this medicine. Use exactly as directed. Take your medicine at regular intervals. Do not take it more often than directed. If you use  this medicine with insulin, you should inject this medicine and the insulin separately. Do not mix them together. Do not give the injections right next to each other. Change (rotate) injection sites with each injection. It is important that you put your used needles and syringes in a special sharps container. Do not put them in a trash can. If you do not have a sharps container, call your pharmacist or healthcare provider to get one. A special MedGuide will be given to you by the pharmacist with each prescription and refill. Be sure to read this information carefully each time. This drug comes with INSTRUCTIONS FOR USE. Ask your pharmacist for directions on how to use this drug. Read the information carefully. Talk to your pharmacist or health care provider if you have questions. Talk to your pediatrician regarding the use of this medicine in children. Special care may be needed. Overdosage: If you think you have taken too much of this medicine contact a poison control center or emergency room at once. NOTE: This medicine is only for you. Do not share this medicine with others. What if I miss a dose? If you miss a dose, take it as soon as you can within 5 days after the missed dose. Then take your next dose at your regular weekly time. If it has been longer than 5 days after the missed dose, do not take the missed dose. Take the next dose at your regular time. Do not take double or extra doses. If you have questions  about a missed dose, contact your health care provider for advice. What may interact with this medicine? other medicines for diabetes Many medications may cause changes in blood sugar, these include: alcohol containing beverages antiviral medicines for HIV or AIDS aspirin and aspirin-like drugs certain medicines for blood pressure, heart disease, irregular heart beat chromium diuretics female hormones, such as estrogens or progestins, birth control  pills fenofibrate gemfibrozil isoniazid lanreotide female hormones or anabolic steroids MAOIs like Carbex, Eldepryl, Marplan, Nardil, and Parnate medicines for weight loss medicines for allergies, asthma, cold, or cough medicines for depression, anxiety, or psychotic disturbances niacin nicotine NSAIDs, medicines for pain and inflammation, like ibuprofen or naproxen octreotide pasireotide pentamidine phenytoin probenecid quinolone antibiotics such as ciprofloxacin, levofloxacin, ofloxacin some herbal dietary supplements steroid medicines such as prednisone or cortisone sulfamethoxazole; trimethoprim thyroid hormones Some medications can hide the warning symptoms of low blood sugar (hypoglycemia). You may need to monitor your blood sugar more closely if you are taking one of these medications. These include: beta-blockers, often used for high blood pressure or heart problems (examples include atenolol, metoprolol, propranolol) clonidine guanethidine reserpine This list may not describe all possible interactions. Give your health care provider a list of all the medicines, herbs, non-prescription drugs, or dietary supplements you use. Also tell them if you smoke, drink alcohol, or use illegal drugs. Some items may interact with your medicine. What should I watch for while using this medicine? Visit your doctor or health care professional for regular checks on your progress. Drink plenty of fluids while taking this medicine. Check with your doctor or health care professional if you get an attack of severe diarrhea, nausea, and vomiting. The loss of too much body fluid can make it dangerous for you to take this medicine. A test called the HbA1C (A1C) will be monitored. This is a simple blood test. It measures your blood sugar control over the last 2 to 3 months. You will receive this test every 3 to 6 months. Learn how to check your blood sugar. Learn the symptoms of low and high blood  sugar and how to manage them. Always carry a quick-source of sugar with you in case you have symptoms of low blood sugar. Examples include hard sugar candy or glucose tablets. Make sure others know that you can choke if you eat or drink when you develop serious symptoms of low blood sugar, such as seizures or unconsciousness. They must get medical help at once. Tell your doctor or health care professional if you have high blood sugar. You might need to change the dose of your medicine. If you are sick or exercising more than usual, you might need to change the dose of your medicine. Do not skip meals. Ask your doctor or health care professional if you should avoid alcohol. Many nonprescription cough and cold products contain sugar or alcohol. These can affect blood sugar. Pens should never be shared. Even if the needle is changed, sharing may result in passing of viruses like hepatitis or HIV. Wear a medical ID bracelet or chain, and carry a card that describes your disease and details of your medicine and dosage times. Do not become pregnant while taking this medicine. Women should inform their doctor if they wish to become pregnant or think they might be pregnant. There is a potential for serious side effects to an unborn child. Talk to your health care professional or pharmacist for more information. What side effects may I notice from receiving this medicine? Side effects  that you should report to your doctor or health care professional as soon as possible: allergic reactions like skin rash, itching or hives, swelling of the face, lips, or tongue breathing problems changes in vision diarrhea that continues or is severe lump or swelling on the neck severe nausea signs and symptoms of infection like fever or chills; cough; sore throat; pain or trouble passing urine signs and symptoms of low blood sugar such as feeling anxious, confusion, dizziness, increased hunger, unusually weak or tired,  sweating, shakiness, cold, irritable, headache, blurred vision, fast heartbeat, loss of consciousness signs and symptoms of kidney injury like trouble passing urine or change in the amount of urine trouble swallowing unusual stomach upset or pain vomiting Side effects that usually do not require medical attention (report to your doctor or health care professional if they continue or are bothersome): constipation diarrhea nausea pain, redness, or irritation at site where injected stomach upset This list may not describe all possible side effects. Call your doctor for medical advice about side effects. You may report side effects to FDA at 1-800-FDA-1088. Where should I keep my medicine? Keep out of the reach of children. Store unopened pens in a refrigerator between 2 and 8 degrees C (36 and 46 degrees F). Do not freeze. Protect from light and heat. After you first use the pen, it can be stored for 56 days at room temperature between 15 and 30 degrees C (59 and 86 degrees F) or in a refrigerator. Throw away your used pen after 56 days or after the expiration date, whichever comes first. Do not store your pen with the needle attached. If the needle is left on, medicine may leak from the pen. NOTE: This sheet is a summary. It may not cover all possible information. If you have questions about this medicine, talk to your doctor, pharmacist, or health care provider.  2021 [employer] Standard (2019-06-15 09:41:51)

## 2022-04-10 NOTE — Progress Notes (Signed)
Virtual Visit via Video Note  I connected with@  on 04/10/22 at 11:30 AM EDT by a video enabled telemedicine application and verified that I am speaking with the correct person using two identifiers.  Location patient: home Location provider:work  Persons participating in the virtual visit: patient, provider  I discussed the limitations of evaluation and management by telemedicine and the availability of in person appointments. The patient expressed understanding and agreed to proceed.   HPI: Discuss weight loss. She has been eating healthier and has lost 10 lbs . She feels happier and had a recent trip to [place]. She tried contrave years ago.   Metformin '500mg'$  qd caused loose stools. Ozempic and wegovy not covered by insurance. Mounjaro ineffective. H/o prediabetes TSH 1.45 six months ago  Depression - compliant with prozac '20mg'$ . Feels well on this dose. Denies si/hi.    ROS: See pertinent positives and negatives per HPI.    EXAM:  VITALS per patient if applicable: Ht '5\' 3"'$  (1.6 m)   Wt 241 lb (109.3 kg)   BMI 42.69 kg/m  BP Readings from Last 3 Encounters:  03/08/22 127/77  01/23/22 125/80  01/14/22 127/74   Wt Readings from Last 3 Encounters:  04/10/22 241 lb (109.3 kg)  03/08/22 251 lb 12.8 oz (114.2 kg)  01/23/22 238 lb (108 kg)    GENERAL: alert, oriented, appears well and in no acute distress  HEENT: atraumatic, conjunttiva clear, no obvious abnormalities on inspection of external nose and ears  NECK: normal movements of the head and neck  LUNGS: on inspection no signs of respiratory distress, breathing rate appears normal, no obvious gross SOB, gasping or wheezing  CV: no obvious cyanosis  MS: moves all visible extremities without noticeable abnormality  PSYCH/NEURO: pleasant and cooperative, no obvious depression or anxiety, speech and thought processing grossly intact  ASSESSMENT AND PLAN:  Discussed the following assessment and  plan:  Problem List Items Addressed This Visit       Other   Depression, major, recurrent, moderate (HCC)    Chronic, stable.  Continue Prozac 20 mg      Morbid obesity with BMI of 40.0-44.9, adult ([place]) - Primary    H/o prediabetes. Metformin '500mg'$  qd caused loose stools. Ozempic and wegovy not covered by insurance in the past. Mounjaro ineffective. Stop Trulicity. Trial of Ozempic 0.'25mg'$  with current insurance.       Relevant Medications   Semaglutide,0.25 or 0.'5MG'$ /DOS, (OZEMPIC, 0.25 OR 0.5 MG/DOSE,) 2 MG/1.5ML SOPN    -we discussed possible serious and likely etiologies, options for evaluation and workup, limitations of telemedicine visit vs in person visit, treatment, treatment risks and precautions. Pt prefers to treat via telemedicine empirically rather then risking or undertaking an in person visit at this moment.  .   I discussed the assessment and treatment plan with the patient. The patient was provided an opportunity to ask questions and all were answered. The patient agreed with the plan and demonstrated an understanding of the instructions.   The patient was advised to call back or seek an in-person evaluation if the symptoms worsen or if the condition fails to improve as anticipated.   Mable Paris, FNP

## 2022-04-10 NOTE — Assessment & Plan Note (Addendum)
H/o prediabetes. Metformin '500mg'$  qd caused loose stools. Ozempic and wegovy not covered by insurance in the past. Mounjaro ineffective. Stop Trulicity. Trial of Ozempic 0.'25mg'$  with current insurance.

## 2022-04-10 NOTE — Assessment & Plan Note (Signed)
Chronic, stable.  Continue Prozac 20 mg. 

## 2022-04-15 ENCOUNTER — Telehealth: Payer: Self-pay | Admitting: Family

## 2022-04-15 ENCOUNTER — Telehealth: Payer: Self-pay | Admitting: Pain Medicine

## 2022-04-15 NOTE — Telephone Encounter (Signed)
Perfect

## 2022-04-15 NOTE — Telephone Encounter (Signed)
Patient states she is having increased pain in the middle of her back. I gave her appt for Mon July 10 at 2:40

## 2022-04-15 NOTE — Telephone Encounter (Signed)
Pt would like to be called regarding medication that she receive from the ER. Pt stated that they only gave her a few and told her if she needed more she would have to contact her provider.-ibuprofen '800mg'$ 

## 2022-04-19 NOTE — Telephone Encounter (Signed)
Called patient but was unable to leave a vm

## 2022-04-21 NOTE — Progress Notes (Unsigned)
PROVIDER NOTE: Information contained herein reflects review and annotations entered in association with encounter. Interpretation of such information and data should be left to medically-trained personnel. Information provided to patient can be located elsewhere in the medical record under "Patient Instructions". Document created using STT-dictation technology, any transcriptional errors that may result from process are unintentional.    Patient: Jacqueline Delgado  Service Category: E/M  Provider: Gaspar Cola, MD  DOB: 1951/04/12  DOS: 04/22/2022  Specialty: Interventional Pain Management  MRN: [id number]  Setting: Ambulatory outpatient  PCP: Burnard Hawthorne, FNP  Type: Established Patient    Referring Provider: Burnard Hawthorne, FNP  Location: Office  Delivery: Face-to-face     HPI  [name], a 71 y.o. year old female, is here today because of her No primary diagnosis found.. [name] primary complain today is No chief complaint on file. Last encounter: My last encounter with her was on 04/15/2022. Pertinent problems: Ms. Balthazar has Plantar fasciitis; Localized swelling of finger of left hand; Acquired trigger finger; Closed nondisplaced fracture of base of fifth metacarpal bone of right hand; Complete tear of right rotator cuff; DDD (degenerative disc disease), cervical; DDD (degenerative disc disease), lumbar; Chronic low back pain (1ry area of Pain) (Bilateral) (R>L) w/ sciatica (Right); Neck pain; Non-cardiac chest pain; S/P rotator cuff repair; Osteoarthritis, knee; Unspecified osteoarthritis, unspecified site; Osteoarthritis of knee (Left); Ankle swelling, right; Chronic pain syndrome; DDD (degenerative disc disease), lumbosacral; Lumbar facet hypertrophy; Abnormal MRI, lumbar spine (04/12/2013); Lumbosacral central spinal stenosis (9 mm) (L4-5 and L5-S1); Chronic lower extremity pain (Right); Lumbosacral radiculopathy at L5 (Right); Chronic knee pain (3ry area of Pain) (Left);  Swelling of foot (4th area of Pain) (Right); Chronic lower extremity pain (2ry area of Pain) (Bilateral) (R>L); Calcaneal spur of foot (Right); Lumbar Grade 1 Anterolisthesis of L4/L5; Lumbosacral facet syndrome (Bilateral) (R>L); Chronic low back pain (Bilateral) (R>L) w/o sciatica; Spondylosis without myelopathy or radiculopathy, lumbosacral region; Osteoarthritis of lumbar spine; and Osteoarthritis involving multiple joints on their pertinent problem list. Pain Assessment: Severity of   is reported as a  /10. Location:    / . Onset:  . Quality:  . Timing:  . Modifying factor(s):  Marland Kitchen Vitals:  vitals were not taken for this visit.   Reason for encounter:  *** . ***  Pharmacotherapy Assessment  Analgesic: Tramadol 50 mg tablet, 1 tab p.o. twice daily (PRN) MME/day: 0-10 mg/day   Monitoring: [place] PMP: PDMP reviewed during this encounter.       Pharmacotherapy: No side-effects or adverse reactions reported. Compliance: No problems identified. Effectiveness: Clinically acceptable.  No notes on file  UDS:  Summary  Date Value Ref Range Status  05/22/2020 Note  Final    Comment:    ==================================================================== Compliance Drug Analysis, Ur ==================================================================== Test                             Result       Flag       Units  Drug Present and Declared for Prescription Verification   Tramadol                       >3268        EXPECTED   ng/mg creat   O-Desmethyltramadol            2372         EXPECTED   ng/mg creat  N-Desmethyltramadol            1509         EXPECTED   ng/mg creat    Source of tramadol is a prescription medication. O-desmethyltramadol    and N-desmethyltramadol are expected metabolites of tramadol.    Bupropion                      PRESENT      EXPECTED   Hydroxybupropion               PRESENT      EXPECTED    Hydroxybupropion is an expected metabolite of bupropion.  Drug Present not  Declared for Prescription Verification   Dextromethorphan               PRESENT      UNEXPECTED   Dextrorphan/Levorphanol        PRESENT      UNEXPECTED    Dextrorphan is an expected metabolite of dextromethorphan, an over-    the-counter or prescription cough suppressant. Dextrorphan cannot be    distinguished from the scheduled prescription medication levorphanol    by the method used for analysis.  ==================================================================== Test                      Result    Flag   Units      Ref Range   Creatinine              153              mg/dL      >=20 ==================================================================== Declared Medications:  The flagging and interpretation on this report are based on the  following declared medications.  Unexpected results may arise from  inaccuracies in the declared medications.   **Note: The testing scope of this panel includes these medications:   Bupropion (Wellbutrin)  Tramadol (Ultram)   **Note: The testing scope of this panel does not include the  following reported medications:   Famotidine (Pepcid)  Hydrochlorothiazide (Hydrodiuril)  Losartan (Cozaar)  Meloxicam (Mobic)  Mirabegron (Myrbetriq)  Rabeprazole (Aciphex) ==================================================================== For clinical consultation, please call [phone]. ====================================================================      ROS  Constitutional: Denies any fever or chills Gastrointestinal: No reported hemesis, hematochezia, vomiting, or acute GI distress Musculoskeletal: Denies any acute onset joint swelling, redness, loss of ROM, or weakness Neurological: No reported episodes of acute onset apraxia, aphasia, dysarthria, agnosia, amnesia, paralysis, loss of coordination, or loss of consciousness  Medication Review  FLUoxetine, Semaglutide(0.25 or 0.5MG/DOS), cetirizine, dexlansoprazole, hydrochlorothiazide,  losartan, meloxicam, ondansetron, potassium chloride, traZODone, and valACYclovir  History Review  Allergy: [name] is allergic to mobic [meloxicam]. Drug: [name]  reports no history of drug use. Alcohol:  reports no history of alcohol use. Tobacco:  reports that she has never smoked. She has never used smokeless tobacco. Social: [name]  reports that she has never smoked. She has never used smokeless tobacco. She reports that she does not drink alcohol and does not use drugs. Medical:  has a past medical history of Anemia, Anginal pain ([place]), Arthritis, Colon polyp, GERD (gastroesophageal reflux disease), Hiatal hernia, History of hiatal hernia, History of methicillin resistant staphylococcus aureus (MRSA), Hypertension, Obesity, Reflux, Sleep apnea, and Small vessel disease ([place]). Surgical: [name]  has a past surgical history that includes Joint replacement (Right); Joint replacement; Cholecystectomy; Abdominal hysterectomy; Shoulder arthroscopy with rotator cuff repair and subacromial decompression (Right, 06/14/2015); Esophagogastroduodenoscopy (egd) with  propofol (N/A, 10/11/2015); Savory dilation (N/A, 10/11/2015); Colonoscopy w/ polypectomy; Esophagogastroduodenoscopy (egd) with propofol (N/A, 12/10/2017); Colonoscopy with propofol (N/A, 12/10/2017); Total knee arthroplasty (Left, 03/06/2021); and Joint replacement (Left). Family: family history includes Alcoholism in an other family member; Arthritis in an other family member; Breast cancer in her maternal grandmother; Breast cancer (age of onset: 72) in her mother; Diabetes in an other family member; Heart disease in her father; Hyperlipidemia in her father; Hypertension in her father; Stroke in her father.  Laboratory Chemistry Profile   Renal Lab Results  Component Value Date   BUN 18 03/13/2022   CREATININE 0.99 03/13/2022   BCR 20 01/19/2021   GFR 57.94 (L) 03/13/2022   GFRAA 81 09/13/2020   GFRNONAA >60 03/13/2021     Hepatic Lab Results  Component Value Date   AST 11 03/13/2022   ALT 7 03/13/2022   ALBUMIN 4.0 03/13/2022   ALKPHOS 97 03/13/2022   LIPASE 36 10/12/2017    Electrolytes Lab Results  Component Value Date   NA 141 03/13/2022   K 3.7 03/13/2022   CL 103 03/13/2022   CALCIUM 9.8 03/13/2022   MG 2.0 05/22/2020    Bone Lab Results  Component Value Date   VD25OH 27.17 (L) 10/05/2021   25OHVITD1 18 (L) 05/22/2020   25OHVITD2 13 05/22/2020   25OHVITD3 5.3 05/22/2020    Inflammation (CRP: Acute Phase) (ESR: Chronic Phase) Lab Results  Component Value Date   CRP 15 (H) 05/22/2020   ESRSEDRATE 73 (H) 05/22/2020         Note: Above Lab results reviewed.  Recent Imaging Review  [place] CHEST SOFT TISSUE CLINICAL DATA:  Palpable mass chest mass with pain and tenderness at xiphoid region  EXAM: ULTRASOUND OF CHEST SOFT TISSUES  TECHNIQUE: Ultrasound examination of the chest wall soft tissues was performed in the area of clinical concern.  COMPARISON:  None  FINDINGS: Sonography was performed at the site of clinical symptoms and palpable concern.  At this point, subcutaneous soft tissues are normal in appearance.  No evidence of subcutaneous mass, fluid collection or fluid.  A prominent xiphoid which projects anteriorly and indents the posterior subcutaneous fat is identified, corresponding to palpable concern.  IMPRESSION: Prominent xiphoid which deflects anteriorly and impinges upon the subcutaneous fat, likely accounts for palpable finding.  No additional masses identified.  Electronically Signed   By: Lavonia Dana M.D.   On: 12/21/2021 08:32 Note: Reviewed        Physical Exam  General appearance: Well nourished, well developed, and well hydrated. In no apparent acute distress Mental status: Alert, oriented x 3 (person, place, & time)       Respiratory: No evidence of acute respiratory distress Eyes: PERLA Vitals: There were no vitals taken for this  visit. BMI: Estimated body mass index is 42.69 kg/m as calculated from the following:   Height as of 04/10/22: '5\' 3"'  (1.6 m).   Weight as of 04/10/22: 241 lb (109.3 kg). Ideal: Ideal body weight: 52.4 kg (115 lb 8.3 oz) Adjusted ideal body weight: 75.2 kg (165 lb 11.4 oz)  Assessment   Diagnosis Status  No diagnosis found. Controlled Controlled Controlled   Updated Problems: No problems updated.  Plan of Care  Problem-specific:  No problem-specific Assessment & Plan notes found for this encounter.  [name] has a current medication list which includes the following long-term medication(s): cetirizine, dexlansoprazole, fluoxetine, hydrochlorothiazide, losartan, and potassium chloride.  Pharmacotherapy (Medications Ordered): No orders of the defined  types were placed in this encounter.  Orders:  No orders of the defined types were placed in this encounter.  Follow-up plan:   No follow-ups on file.     Interventional Therapies  Risk  Complexity Considerations:   Estimated body mass index is 42.34 kg/m as calculated from the following:   Height as of this encounter: '5\' 3"'  (1.6 m).   Weight as of this encounter: 239 lb (108.4 kg). Note: No RFA until BMI is below 35 kg/m.   Planned  Pending:   Second referral to bariatric surgery and medical weight management   Under consideration:   None   Completed:   Therapeutic/palliative bilateral lumbar facet MBB x3 (08/30/2021) (100/100/100/0) pain worsened after injection. Therapeutic right L4-5 LESI x1 (07/27/2020)    Therapeutic  Palliative (PRN) options:   Palliative bilateral lumbar facet MBB #4  Therapeutic right L4-5 LESI #2      Recent Visits Date Type Provider Dept  01/23/22 Office Visit Milinda Pointer, MD [facility]  Showing recent visits within past 90 days and meeting all other requirements Future Appointments Date Type Provider Dept  04/22/22 Appointment Milinda Pointer, MD  [facility]  Showing future appointments within next 90 days and meeting all other requirements  I discussed the assessment and treatment plan with the patient. The patient was provided an opportunity to ask questions and all were answered. The patient agreed with the plan and demonstrated an understanding of the instructions.  Patient advised to call back or seek an in-person evaluation if the symptoms or condition worsens.  Duration of encounter: *** minutes.  Total time on encounter, as per AMA guidelines included both the face-to-face and non-face-to-face time personally spent by the physician and/or other qualified health care professional(s) on the day of the encounter (includes time in activities that require the physician or other qualified health care professional and does not include time in activities normally performed by clinical staff). Physician's time may include the following activities when performed: preparing to see the patient (eg, review of tests, pre-charting review of records) obtaining and/or reviewing separately obtained history performing a medically appropriate examination and/or evaluation counseling and educating the patient/family/caregiver ordering medications, tests, or procedures referring and communicating with other health care professionals (when not separately reported) documenting clinical information in the electronic or other health record independently interpreting results (not separately reported) and communicating results to the patient/ family/caregiver care coordination (not separately reported)  Note by: Gaspar Cola, MD Date: 04/22/2022; Time: 2:55 PM

## 2022-04-22 ENCOUNTER — Ambulatory Visit: Payer: Medicare Other | Attending: Pain Medicine | Admitting: Pain Medicine

## 2022-04-22 ENCOUNTER — Encounter: Payer: Self-pay | Admitting: Pain Medicine

## 2022-04-22 VITALS — BP 138/80 | HR 78 | Temp 97.5°F | Resp 16 | Ht 63.0 in | Wt 241.0 lb

## 2022-04-22 DIAGNOSIS — M47817 Spondylosis without myelopathy or radiculopathy, lumbosacral region: Secondary | ICD-10-CM | POA: Diagnosis not present

## 2022-04-22 DIAGNOSIS — M47816 Spondylosis without myelopathy or radiculopathy, lumbar region: Secondary | ICD-10-CM | POA: Insufficient documentation

## 2022-04-22 DIAGNOSIS — M545 Low back pain, unspecified: Secondary | ICD-10-CM | POA: Insufficient documentation

## 2022-04-22 DIAGNOSIS — M4310 Spondylolisthesis, site unspecified: Secondary | ICD-10-CM | POA: Diagnosis not present

## 2022-04-22 NOTE — Patient Instructions (Signed)
______________________________________________________________________  Preparing for Procedure with Sedation  NOTICE: Due to recent regulatory changes, starting on May 14, 2021, procedures requiring intravenous (IV) sedation will no longer be performed at the [facility].  These types of procedures are required to be performed at [facility] ambulatory surgery facility.  We are very sorry for the inconvenience.  Procedure appointments are limited to planned procedures: No Prescription Refills. No disability issues will be discussed. No medication changes will be discussed.  Instructions: Oral Intake: Do not eat or drink anything for at least 8 hours prior to your procedure. (Exception: Blood Pressure Medication. See below.) Transportation: A driver is required. You may not drive yourself after the procedure. Blood Pressure Medicine: Do not forget to take your blood pressure medicine with a sip of water the morning of the procedure. If your Diastolic (lower reading) is above 100 mmHg, elective cases will be cancelled/rescheduled. Blood thinners: These will need to be stopped for procedures. Notify our staff if you are taking any blood thinners. Depending on which one you take, there will be specific instructions on how and when to stop it. Diabetics on insulin: Notify the staff so that you can be scheduled 1st case in the morning. If your diabetes requires high dose insulin, take only  of your normal insulin dose the morning of the procedure and notify the staff that you have done so. Preventing infections: Shower with an antibacterial soap the morning of your procedure. Build-up your immune system: Take 1000 mg of Vitamin C with every meal (3 times a day) the day prior to your procedure. Antibiotics: Inform the staff if you have a condition or reason that requires you to take antibiotics before dental procedures. Pregnancy: If you are pregnant, call and cancel the procedure. Sickness: If  you have a cold, fever, or any active infections, call and cancel the procedure. Arrival: You must be in the facility at least 30 minutes prior to your scheduled procedure. Children: Do not bring children with you. Dress appropriately: There is always the possibility that your clothing may get soiled. Valuables: Do not bring any jewelry or valuables.  Reasons to call and reschedule or cancel your procedure: (Following these recommendations will minimize the risk of a serious complication.) Surgeries: Avoid having procedures within 2 weeks of any surgery. (Avoid for 2 weeks before or after any surgery). Flu Shots: Avoid having procedures within 2 weeks of a flu shots. (Avoid for 2 weeks before or after immunizations). Barium: Avoid having a procedure within 7-10 days after having had a radiological study involving the use of radiological contrast. (Myelograms, Barium swallow or enema study). Heart attacks: Avoid any elective procedures or surgeries for the initial 6 months after a "Myocardial Infarction" (Heart Attack). Blood thinners: It is imperative that you stop these medications before procedures. Let us know if you if you take any blood thinner.  Infection: Avoid procedures during or within two weeks of an infection (including chest colds or gastrointestinal problems). Symptoms associated with infections include: Localized redness, fever, chills, night sweats or profuse sweating, burning sensation when voiding, cough, congestion, stuffiness, runny nose, sore throat, diarrhea, nausea, vomiting, cold or Flu symptoms, recent or current infections. It is specially important if the infection is over the area that we intend to treat. Heart and lung problems: Symptoms that may suggest an active cardiopulmonary problem include: cough, chest pain, breathing difficulties or shortness of breath, dizziness, ankle swelling, uncontrolled high or unusually low blood pressure, and/or palpitations. If you are    experiencing any of these symptoms, cancel your procedure and contact your primary care physician for an evaluation.  Remember:  Regular Business hours are:  Monday to Thursday 8:00 AM to 4:00 PM  Provider's Schedule: Marylan Glore, MD:  Procedure days: Tuesday and Thursday 7:30 AM to 4:00 PM  Bilal Lateef, MD:  Procedure days: Monday and Wednesday 7:30 AM to 4:00 PM ______________________________________________________________________  ____________________________________________________________________________________________  General Risks and Possible Complications  Patient Responsibilities: It is important that you read this as it is part of your informed consent. It is our duty to inform you of the risks and possible complications associated with treatments offered to you. It is your responsibility as a patient to read this and to ask questions about anything that is not clear or that you believe was not covered in this document.  Patient's Rights: You have the right to refuse treatment. You also have the right to change your mind, even after initially having agreed to have the treatment done. However, under this last option, if you wait until the last second to change your mind, you may be charged for the materials used up to that point.  Introduction: Medicine is not an exact science. Everything in Medicine, including the lack of treatment(s), carries the potential for danger, harm, or loss (which is by definition: Risk). In Medicine, a complication is a secondary problem, condition, or disease that can aggravate an already existing one. All treatments carry the risk of possible complications. The fact that a side effects or complications occurs, does not imply that the treatment was conducted incorrectly. It must be clearly understood that these can happen even when everything is done following the highest safety standards.  No treatment: You can choose not to proceed with the  proposed treatment alternative. The "PRO(s)" would include: avoiding the risk of complications associated with the therapy. The "CON(s)" would include: not getting any of the treatment benefits. These benefits fall under one of three categories: diagnostic; therapeutic; and/or palliative. Diagnostic benefits include: getting information which can ultimately lead to improvement of the disease or symptom(s). Therapeutic benefits are those associated with the successful treatment of the disease. Finally, palliative benefits are those related to the decrease of the primary symptoms, without necessarily curing the condition (example: decreasing the pain from a flare-up of a chronic condition, such as incurable terminal cancer).  General Risks and Complications: These are associated to most interventional treatments. They can occur alone, or in combination. They fall under one of the following six (6) categories: no benefit or worsening of symptoms; bleeding; infection; nerve damage; allergic reactions; and/or death. No benefits or worsening of symptoms: In Medicine there are no guarantees, only probabilities. No healthcare provider can ever guarantee that a medical treatment will work, they can only state the probability that it may. Furthermore, there is always the possibility that the condition may worsen, either directly, or indirectly, as a consequence of the treatment. Bleeding: This is more common if the patient is taking a blood thinner, either prescription or over the counter (example: Goody Powders, Fish oil, Aspirin, Garlic, etc.), or if suffering a condition associated with impaired coagulation (example: Hemophilia, cirrhosis of the liver, low platelet counts, etc.). However, even if you do not have one on these, it can still happen. If you have any of these conditions, or take one of these drugs, make sure to notify your treating physician. Infection: This is more common in patients with a compromised  immune system, either due to disease (example:   diabetes, cancer, human immunodeficiency virus [HIV], etc.), or due to medications or treatments (example: therapies used to treat cancer and rheumatological diseases). However, even if you do not have one on these, it can still happen. If you have any of these conditions, or take one of these drugs, make sure to notify your treating physician. Nerve Damage: This is more common when the treatment is an invasive one, but it can also happen with the use of medications, such as those used in the treatment of cancer. The damage can occur to small secondary nerves, or to large primary ones, such as those in the spinal cord and brain. This damage may be temporary or permanent and it may lead to impairments that can range from temporary numbness to permanent paralysis and/or brain death. Allergic Reactions: Any time a substance or material comes in contact with our body, there is the possibility of an allergic reaction. These can range from a mild skin rash (contact dermatitis) to a severe systemic reaction (anaphylactic reaction), which can result in death. Death: In general, any medical intervention can result in death, most of the time due to an unforeseen complication. ____________________________________________________________________________________________  

## 2022-04-23 ENCOUNTER — Telehealth: Payer: Self-pay | Admitting: Family

## 2022-04-23 MED ORDER — OZEMPIC (0.25 OR 0.5 MG/DOSE) 2 MG/1.5ML ~~LOC~~ SOPN: 0.5000 mg | PEN_INJECTOR | SUBCUTANEOUS | 3 refills | Status: DC

## 2022-04-23 NOTE — Addendum Note (Signed)
Addended by: [place], Angello Chien on: 04/23/2022 10:20 AM   Modules accepted: Orders

## 2022-04-23 NOTE — Telephone Encounter (Signed)
Spoke to patient and she is doing much better therefore she feels that she does not need to come into the office. Patient stated that she will call and schedule appt if necessary

## 2022-04-23 NOTE — Telephone Encounter (Signed)
Patient called and said she has not been able to get her Semaglutide,0.25 or 0.'5MG'$ /DOS, (OZEMPIC, 0.25 OR 0.5 MG/DOSE,) 2 MG/1.5ML SOPN refilled. She called the pharmacy and they told her the problem is how the dosage was written. Prescription needs to be re written.

## 2022-04-25 ENCOUNTER — Ambulatory Visit: Payer: Medicare Other | Admitting: Psychologist

## 2022-05-02 ENCOUNTER — Encounter: Payer: Self-pay | Admitting: *Deleted

## 2022-05-03 ENCOUNTER — Encounter: Payer: Self-pay | Admitting: *Deleted

## 2022-05-03 ENCOUNTER — Ambulatory Visit: Payer: Medicare Other | Admitting: Certified Registered Nurse Anesthetist

## 2022-05-03 ENCOUNTER — Encounter
Admission: RE | Disposition: A | Discharge status: Home / Self Care | Payer: Self-pay | Source: Home / Self Care | Attending: Gastroenterology

## 2022-05-03 ENCOUNTER — Ambulatory Visit
Admission: RE | Admit: 2022-05-03 | Discharge: 2022-05-03 | Disposition: A | Discharge status: Home / Self Care | Payer: Medicare Other | Attending: Gastroenterology | Admitting: Gastroenterology

## 2022-05-03 DIAGNOSIS — I10 Essential (primary) hypertension: Secondary | ICD-10-CM | POA: Diagnosis not present

## 2022-05-03 DIAGNOSIS — R1314 Dysphagia, pharyngoesophageal phase: Secondary | ICD-10-CM | POA: Diagnosis not present

## 2022-05-03 DIAGNOSIS — E669 Obesity, unspecified: Secondary | ICD-10-CM | POA: Diagnosis not present

## 2022-05-03 DIAGNOSIS — K449 Diaphragmatic hernia without obstruction or gangrene: Secondary | ICD-10-CM | POA: Diagnosis not present

## 2022-05-03 DIAGNOSIS — Z96653 Presence of artificial knee joint, bilateral: Secondary | ICD-10-CM | POA: Diagnosis not present

## 2022-05-03 DIAGNOSIS — R1310 Dysphagia, unspecified: Secondary | ICD-10-CM | POA: Diagnosis not present

## 2022-05-03 DIAGNOSIS — M1990 Unspecified osteoarthritis, unspecified site: Secondary | ICD-10-CM | POA: Diagnosis not present

## 2022-05-03 DIAGNOSIS — Z6841 Body Mass Index (BMI) 40.0 and over, adult: Secondary | ICD-10-CM | POA: Diagnosis not present

## 2022-05-03 DIAGNOSIS — F32A Depression, unspecified: Secondary | ICD-10-CM | POA: Diagnosis not present

## 2022-05-03 DIAGNOSIS — Z7985 Long-term (current) use of injectable non-insulin antidiabetic drugs: Secondary | ICD-10-CM | POA: Diagnosis not present

## 2022-05-03 DIAGNOSIS — T182XXA Foreign body in stomach, initial encounter: Secondary | ICD-10-CM | POA: Diagnosis not present

## 2022-05-03 DIAGNOSIS — G4730 Sleep apnea, unspecified: Secondary | ICD-10-CM | POA: Insufficient documentation

## 2022-05-03 DIAGNOSIS — Z9049 Acquired absence of other specified parts of digestive tract: Secondary | ICD-10-CM | POA: Insufficient documentation

## 2022-05-03 DIAGNOSIS — K219 Gastro-esophageal reflux disease without esophagitis: Secondary | ICD-10-CM | POA: Diagnosis not present

## 2022-05-03 HISTORY — PX: ESOPHAGOGASTRODUODENOSCOPY (EGD) WITH PROPOFOL: SHX5813

## 2022-05-03 HISTORY — DX: Prediabetes: R73.03

## 2022-05-03 SURGERY — ESOPHAGOGASTRODUODENOSCOPY (EGD) WITH PROPOFOL
Anesthesia: General

## 2022-05-03 MED ORDER — PROPOFOL 10 MG/ML IV BOLUS
INTRAVENOUS | Status: DC | PRN
  Administered 2022-05-03: 80 mg via INTRAVENOUS

## 2022-05-03 MED ORDER — SODIUM CHLORIDE 0.9 % IV SOLN
INTRAVENOUS | Status: DC
  Administered 2022-05-03: 500 mL via INTRAVENOUS

## 2022-05-03 MED ORDER — PROPOFOL 500 MG/50ML IV EMUL
INTRAVENOUS | Status: DC | PRN
  Administered 2022-05-03: 150 ug/kg/min via INTRAVENOUS

## 2022-05-03 NOTE — H&P (Signed)
Outpatient short stay form Pre-procedure 05/03/2022  Jacqueline Rubenstein, MD  Primary Physician: Burnard Hawthorne, FNP  Reason for visit:  Dysphagia/GERD  History of present illness:    71 y/o lady with history of hypertension, obesity, and GERD here for EGD for GERD and mild solid food dysphagia. Her dysphagia is infrequent and her major complaint is GERD. GERD started getting really bad last year. She started trulicity last year and is now on ozempic. No blood thinners. No family history of GI malignancies. No neck surgeries. History of hysterectomy.    Current Facility-Administered Medications:    0.9 %  sodium chloride infusion, , Intravenous, Continuous, Tashia Leiterman, Hilton Cork, MD  Medications Prior to Admission  Medication Sig Dispense Refill Last Dose   FLUoxetine (PROZAC) 20 MG capsule Take 1 capsule (20 mg total) by mouth every morning. 90 capsule 3 05/02/2022   losartan (COZAAR) 50 MG tablet TAKE 1 TABLET BY MOUTH EVERY DAY 90 tablet 1 05/02/2022   meloxicam (MOBIC) 7.5 MG tablet Take 7.5 mg by mouth daily.   05/02/2022   potassium chloride (KLOR-CON M) 10 MEQ tablet Take 1 tablet (10 mEq total) by mouth daily. 120 tablet 1 05/02/2022   Semaglutide,0.25 or 0.'5MG'$ /DOS, (OZEMPIC, 0.25 OR 0.5 MG/DOSE,) 2 MG/1.5ML SOPN Inject 0.5 mg into the skin once a week. 3 mL 3 05/02/2022   traZODone (DESYREL) 50 MG tablet Take 0.5 tablets by mouth at bedtime as needed.   Past Week   cetirizine (ZYRTEC) 10 MG tablet Take 1 tablet (10 mg total) by mouth daily. 90 tablet 3  at prn   dexlansoprazole (DEXILANT) 60 MG capsule Take 1 capsule by mouth daily. (Patient not taking: Reported on 05/03/2022)   Not Taking   hydrochlorothiazide (HYDRODIURIL) 25 MG tablet Take 1 tablet (25 mg total) by mouth daily. 90 tablet 3    ondansetron (ZOFRAN-ODT) 4 MG disintegrating tablet TAKE 1 TABLET BY MOUTH EVERY 8 HOURS AS NEEDED FOR NAUSEA AND VOMITING 30 tablet 1  at prn   valACYclovir (VALTREX) 500 MG tablet TAKE 2  TABLETS BY MOUTH 2 TIMES DAILY FOR 3 DAYS. ASAP WITHIN 24 HOURS OF ONSET SYMTOMS. 30 tablet 1  at prn     No Known Allergies   Past Medical History:  Diagnosis Date   Anemia    H/O   Anginal pain ([place])     3/8-12/21/10   Arthritis    Osteoarthritis in BLE knee   Colon polyp    GERD (gastroesophageal reflux disease)    Hiatal hernia    History of hiatal hernia    History of methicillin resistant staphylococcus aureus (MRSA)    Hypertension    controlled   Obesity    Pre-diabetes    Reflux    Sleep apnea    NO CPAP-SLEEP STUDY FROM 2021 WAS NEGATIVE PER PT   Small vessel disease ([place])     Review of systems:  Otherwise negative.    Physical Exam  Gen: Alert, oriented. Appears stated age.  HEENT: PERRLA. Lungs: No respiratory distress CV: RRR Abd: soft, benign, no masses Ext: No edema    Planned procedures: Proceed with colonoscopy. The patient understands the nature of the planned procedure, indications, risks, alternatives and potential complications including but not limited to bleeding, infection, perforation, damage to internal organs and possible oversedation/side effects from anesthesia. The patient agrees and gives consent to proceed.  Please refer to procedure notes for findings, recommendations and patient disposition/instructions.     Jacqueline Rubenstein,  MD [facility]

## 2022-05-03 NOTE — Interval H&P Note (Signed)
History and Physical Interval Note:  05/03/2022 9:48 AM  Jacqueline Delgado  has presented today for surgery, with the diagnosis of GERD.  The various methods of treatment have been discussed with the patient and family. After consideration of risks, benefits and other options for treatment, the patient has consented to  Procedure(s): ESOPHAGOGASTRODUODENOSCOPY (EGD) WITH PROPOFOL (N/A) as a surgical intervention.  The patient's history has been reviewed, patient examined, no change in status, stable for surgery.  I have reviewed the patient's chart and labs.  Questions were answered to the patient's satisfaction.     [name]  Ok to proceed with colonsocopy

## 2022-05-03 NOTE — Anesthesia Procedure Notes (Signed)
Date/Time: 05/03/2022 9:54 AM  Performed by: Johnna Acosta, CRNAPre-anesthesia Checklist: Patient identified, Emergency Drugs available, Suction available, Patient being monitored and Timeout performed Patient Re-evaluated:Patient Re-evaluated prior to induction Oxygen Delivery Method: Nasal cannula Preoxygenation: Pre-oxygenation with 100% oxygen Induction Type: IV induction

## 2022-05-03 NOTE — Anesthesia Preprocedure Evaluation (Addendum)
Anesthesia Evaluation  Patient identified by MRN, date of birth, ID band Patient awake    Reviewed: Allergy & Precautions, NPO status , Patient's Chart, lab work & pertinent test results  History of Anesthesia Complications Negative for: history of anesthetic complications  Airway Mallampati: III  TM Distance: >3 FB Neck ROM: full    Dental  (+) Chipped   Pulmonary neg shortness of breath, sleep apnea ,    Pulmonary exam normal        Cardiovascular Exercise Tolerance: Good hypertension, (-) anginaNormal cardiovascular exam     Neuro/Psych Depression  Neuromuscular disease    GI/Hepatic Neg liver ROS, hiatal hernia, GERD  Controlled,  Endo/Other  negative endocrine ROS  Renal/GU negative Renal ROS  negative genitourinary   Musculoskeletal  (+) Arthritis ,   Abdominal   Peds  Hematology negative hematology ROS (+)   Anesthesia Other Findings Patient is NPO appropriate and reports no nausea or vomiting today.  Past Medical History: No date: Anemia     Comment:  H/O No date: Anginal pain ([place])     Comment:   3/8-12/21/10 No date: Arthritis     Comment:  Osteoarthritis in BLE knee No date: Colon polyp No date: GERD (gastroesophageal reflux disease) No date: Hiatal hernia No date: History of hiatal hernia No date: History of methicillin resistant staphylococcus aureus (MRSA) No date: Hypertension     Comment:  controlled No date: Obesity No date: Pre-diabetes No date: Reflux No date: Sleep apnea     Comment:  NO CPAP-SLEEP STUDY FROM 2021 WAS NEGATIVE PER PT No date: Small vessel disease ([place])  Past Surgical History: No date: ABDOMINAL HYSTERECTOMY     Comment:  partial; per patient has left ovary No date: CHOLECYSTECTOMY No date: COLONOSCOPY W/ POLYPECTOMY     Comment:  adenomatous colon polyp 12/10/2017: COLONOSCOPY WITH PROPOFOL; N/A     Comment:  Procedure: COLONOSCOPY WITH PROPOFOL;  Surgeon:  Manya Silvas, MD;  Location: [facility] ENDOSCOPY;  Service:               Endoscopy;  Laterality: N/A; 10/11/2015: ESOPHAGOGASTRODUODENOSCOPY (EGD) WITH PROPOFOL; N/A     Comment:  Procedure: ESOPHAGOGASTRODUODENOSCOPY (EGD) WITH               PROPOFOL;  Surgeon: Manya Silvas, MD;  Location: [facility]              ENDOSCOPY;  Service: Endoscopy;  Laterality: N/A; 12/10/2017: ESOPHAGOGASTRODUODENOSCOPY (EGD) WITH PROPOFOL; N/A     Comment:  Procedure: ESOPHAGOGASTRODUODENOSCOPY (EGD) WITH               PROPOFOL;  Surgeon: Manya Silvas, MD;  Location:               [facility] ENDOSCOPY;  Service: Endoscopy;  Laterality: N/A; No date: JOINT REPLACEMENT; Right     Comment:  knee, Oct 2012 No date: JOINT REPLACEMENT     Comment:  hopefully getting left partial knee replacement in July               2016 No date: JOINT REPLACEMENT; Left     Comment:  03/06/21 10/11/2015: SAVORY DILATION; N/A     Comment:  Procedure: SAVORY DILATION;  Surgeon: Manya Silvas,               MD;  Location: [facility] ENDOSCOPY;  Service: Endoscopy;  Laterality: N/A; 06/14/2015: SHOULDER ARTHROSCOPY WITH ROTATOR CUFF REPAIR AND  SUBACROMIAL DECOMPRESSION; Right     Comment:  Procedure: SHOULDER ARTHROSCOPY WITH mini open ROTATOR               CUFF REPAIR AND SUBACROMIAL DECOMPRESSION, release long               head biceps tendon.;  Surgeon: Leanor Kail, MD;                Location: [facility] ORS;  Service: Orthopedics;  Laterality:               Right; 03/06/2021: TOTAL KNEE ARTHROPLASTY; Left     Comment:  Procedure: TOTAL KNEE ARTHROPLASTY - Rachelle Hora to               Assist;  Surgeon: Hessie Knows, MD;  Location: [facility] ORS;              Service: Orthopedics;  Laterality: Left;  BMI    Body Mass Index: 42.96 kg/m      Reproductive/Obstetrics negative OB ROS                            Anesthesia Physical Anesthesia Plan  ASA: 3  Anesthesia Plan:  General   Post-op Pain Management:    Induction: Intravenous  PONV Risk Score and Plan: Propofol infusion and TIVA  Airway Management Planned: Natural Airway and Nasal Cannula  Additional Equipment:   Intra-op Plan:   Post-operative Plan:   Informed Consent: I have reviewed the patients History and Physical, chart, labs and discussed the procedure including the risks, benefits and alternatives for the proposed anesthesia with the patient or authorized representative who has indicated his/her understanding and acceptance.     Dental Advisory Given  Plan Discussed with: Anesthesiologist, CRNA and Surgeon  Anesthesia Plan Comments: (Patient consented for risks of anesthesia including but not limited to:  - adverse reactions to medications - risk of airway placement if required - damage to eyes, teeth, lips or other oral mucosa - nerve damage due to positioning  - sore throat or hoarseness - Damage to heart, brain, nerves, lungs, other parts of body or loss of life  Patient voiced understanding.)        Anesthesia Quick Evaluation

## 2022-05-03 NOTE — Interval H&P Note (Signed)
History and Physical Interval Note:  05/03/2022 9:52 AM  Jacqueline Delgado  has presented today for surgery, with the diagnosis of GERD.  The various methods of treatment have been discussed with the patient and family. After consideration of risks, benefits and other options for treatment, the patient has consented to  Procedure(s): ESOPHAGOGASTRODUODENOSCOPY (EGD) WITH PROPOFOL (N/A) as a surgical intervention.  The patient's history has been reviewed, patient examined, no change in status, stable for surgery.  I have reviewed the patient's chart and labs.  Questions were answered to the patient's satisfaction.     Ricke Kimoto T Burdette Gergely  Correction, ok to proceed with EGD. No colonoscopy.

## 2022-05-03 NOTE — Anesthesia Postprocedure Evaluation (Signed)
Anesthesia Post Note  Patient: Jacqueline Delgado  Procedure(s) Performed: ESOPHAGOGASTRODUODENOSCOPY (EGD) WITH PROPOFOL  Patient location during evaluation: Endoscopy Anesthesia Type: General Level of consciousness: awake and alert Pain management: pain level controlled Vital Signs Assessment: post-procedure vital signs reviewed and stable Respiratory status: spontaneous breathing, nonlabored ventilation, respiratory function stable and patient connected to nasal cannula oxygen Cardiovascular status: blood pressure returned to baseline and stable Postop Assessment: no apparent nausea or vomiting Anesthetic complications: no   No notable events documented.   Last Vitals:  Vitals:   05/03/22 1015 05/03/22 1025  BP: 128/86 137/81  Pulse: 62 65  Resp: 17 11  Temp:    SpO2: 100% 100%    Last Pain:  Vitals:   05/03/22 1025  TempSrc:   PainSc: 0-No pain                 Precious Haws Gearold Wainer

## 2022-05-03 NOTE — Transfer of Care (Signed)
Immediate Anesthesia Transfer of Care Note  Patient: Jacqueline Delgado  Procedure(s) Performed: ESOPHAGOGASTRODUODENOSCOPY (EGD) WITH PROPOFOL  Patient Location: PACU  Anesthesia Type:General  Level of Consciousness: awake and alert   Airway & Oxygen Therapy: Patient Spontanous Breathing  Post-op Assessment: Report given to RN and Post -op Vital signs reviewed and stable  Post vital signs: Reviewed and stable  Last Vitals:  Vitals Value Taken Time  BP 106/77 05/03/22 1005  Temp 36 C 05/03/22 1005  Pulse 66 05/03/22 1005  Resp 17 05/03/22 1005  SpO2 99 % 05/03/22 1005    Last Pain:  Vitals:   05/03/22 1005  TempSrc: Temporal  PainSc: 0-No pain         Complications: No notable events documented.

## 2022-05-03 NOTE — Op Note (Signed)
[facility] Patient Name: Jacqueline Delgado Procedure Date: 05/03/2022 9:26 AM MRN: [id number] Account #: [id number] Date of Birth: December 12, 1950 Admit Type: Outpatient Age: 71 Room: [facility] ENDO ROOM 3 Gender: Female Note Status: Finalized Instrument Name: Altamese Cabal Endoscope [id number] Procedure:             Upper GI endoscopy Indications:           Dysphagia, Gastro-esophageal reflux disease Providers:             Andrey Farmer MD, MD Referring MD:          Yvetta Coder. Arnett (Referring MD) Medicines:             Monitored Anesthesia Care Complications:         No immediate complications. Procedure:             Pre-Anesthesia Assessment:                        - Prior to the procedure, a History and Physical was                         performed, and patient medications and allergies were                         reviewed. The patient is competent. The risks and                         benefits of the procedure and the sedation options and                         risks were discussed with the patient. All questions                         were answered and informed consent was obtained.                         Patient identification and proposed procedure were                         verified by the physician, the nurse, the                         anesthesiologist, the anesthetist and the technician                         in the endoscopy suite. Mental Status Examination:                         alert and oriented. Airway Examination: normal                         oropharyngeal airway and neck mobility. Respiratory                         Examination: clear to auscultation. CV Examination:                         normal. Prophylactic Antibiotics: The patient does not  require prophylactic antibiotics. Prior                         Anticoagulants: The patient has taken no previous                         anticoagulant or antiplatelet  agents. ASA Grade                         Assessment: III - A patient with severe systemic                         disease. After reviewing the risks and benefits, the                         patient was deemed in satisfactory condition to                         undergo the procedure. The anesthesia plan was to use                         monitored anesthesia care (MAC). Immediately prior to                         administration of medications, the patient was                         re-assessed for adequacy to receive sedatives. The                         heart rate, respiratory rate, oxygen saturations,                         blood pressure, adequacy of pulmonary ventilation, and                         response to care were monitored throughout the                         procedure. The physical status of the patient was                         re-assessed after the procedure.                        After obtaining informed consent, the endoscope was                         passed under direct vision. Throughout the procedure,                         the patient's blood pressure, pulse, and oxygen                         saturations were monitored continuously. The Endoscope                         was introduced through the mouth, and advanced to the  second part of duodenum. The upper GI endoscopy was                         accomplished without difficulty. The patient tolerated                         the procedure well. Findings:      A small hiatal hernia was present.      The exam of the esophagus was otherwise normal.      A small amount of food (residue) was found in the gastric antrum.      The exam of the stomach was otherwise normal.      The examined duodenum was normal. Impression:            - Small hiatal hernia.                        - A small amount of food (residue) in the stomach.                        - Normal examined duodenum.                         - No specimens collected. Recommendation:        - Discharge patient to home.                        - Resume previous diet.                        - Continue present medications.                        - Return to referring physician as previously                         scheduled.                        - Use Gaviscon PO as directed. Procedure Code(s):     --- Professional ---                        [phone], Esophagogastroduodenoscopy, flexible,                         transoral; diagnostic, including collection of                         specimen(s) by brushing or washing, when performed                         (separate procedure) Diagnosis Code(s):     --- Professional ---                        K44.9, Diaphragmatic hernia without obstruction or                         gangrene                        R13.10, Dysphagia, unspecified  K21.9, Gastro-esophageal reflux disease without                         esophagitis CPT copyright 2019 American Medical Association. All rights reserved. The codes documented in this report are preliminary and upon coder review may  be revised to meet current compliance requirements. Andrey Farmer MD, MD 05/03/2022 10:07:01 AM Number of Addenda: 0 Note Initiated On: 05/03/2022 9:26 AM Estimated Blood Loss:  Estimated blood loss: none.      [facility] Burdick

## 2022-05-06 ENCOUNTER — Encounter: Payer: Self-pay | Admitting: Gastroenterology

## 2022-05-13 ENCOUNTER — Other Ambulatory Visit: Payer: Self-pay

## 2022-05-13 ENCOUNTER — Telehealth: Payer: Self-pay | Admitting: Cardiology

## 2022-05-13 MED ORDER — HYDROCHLOROTHIAZIDE 25 MG PO TABS: 25.0000 mg | ORAL_TABLET | Freq: Every day | ORAL | 0 refills | Status: DC

## 2022-05-13 NOTE — Telephone Encounter (Signed)
Patient declined to schedule now Patient informed to contact PCP for refills

## 2022-05-13 NOTE — Telephone Encounter (Signed)
-----   Message from Anselm Pancoast, [place] sent at 05/13/2022 12:58 PM EDT ----- Sorry, I mean to ask you to contact the patient for a past due follow up appointment. The patient is requesting refills. Thanks, Ivin Booty

## 2022-05-15 ENCOUNTER — Telehealth: Payer: Self-pay

## 2022-05-15 ENCOUNTER — Other Ambulatory Visit: Payer: Self-pay

## 2022-05-15 MED ORDER — HYDROCHLOROTHIAZIDE 25 MG PO TABS: 25.0000 mg | ORAL_TABLET | Freq: Every day | ORAL | 0 refills | Status: DC

## 2022-05-15 NOTE — Telephone Encounter (Signed)
Refill sent and patient notified

## 2022-05-15 NOTE — Telephone Encounter (Signed)
Patient states she needs a refill for her hydrochlorothiazide (HYDRODIURIL) 25 MG tablet.  Patient states she ran out of this medication four days ago.    *Patient states her preferred pharmacy is [facility] [address] in [place].

## 2022-05-22 NOTE — Progress Notes (Unsigned)
PROVIDER NOTE: Interpretation of information contained herein should be left to medically-trained personnel. Specific patient instructions are provided elsewhere under "Patient Instructions" section of medical record. This document was created in part using STT-dictation technology, any transcriptional errors that may result from this process are unintentional.  Patient: Jacqueline Delgado Type: Established DOB: 1951/01/25 MRN: [id number] PCP: Burnard Hawthorne, FNP  Service: Procedure DOS: 05/23/2022 Setting: Ambulatory Location: Ambulatory outpatient facility Delivery: Face-to-face Provider: Gaspar Cola, MD Specialty: Interventional Pain Management Specialty designation: 09 Location: Outpatient facility Ref. Prov.: Burnard Hawthorne, FNP    Procedure:           Type: Lumbar Facet, Medial Branch Block(s) #4  Laterality: Bilateral  Level: L2, L3, L4, L5, & S1 Medial Branch Level(s). Injecting these levels blocks the L3-4, L4-5 and L5-S1 lumbar facet joints. Imaging: Fluoroscopic guidance         Anesthesia: Local anesthesia (1-2% Lidocaine) Anxiolysis: IV Versed         Sedation: None. DOS: 05/23/2022 Performed by: Gaspar Cola, MD  Primary Purpose: Diagnostic/Therapeutic Indications: Low back pain severe enough to impact quality of life or function. No diagnosis found. NAS-11 Pain score:   Pre-procedure:  /10   Post-procedure:  /10     Position / Prep / Materials:  Position: Prone  Prep solution: DuraPrep (Iodine Povacrylex [0.7% available iodine] and Isopropyl Alcohol, 74% w/w) Area Prepped: Posterolateral Lumbosacral Spine (Wide prep: From the lower border of the scapula down to the end of the tailbone and from flank to flank.)  Materials:  Tray: Block Needle(s):  Type: Spinal  Gauge (G): 22  Length: 5-in Qty: 4  Pre-op H&P Assessment:  Jacqueline Delgado is a 71 y.o. (year old), female patient, seen today for interventional treatment. She  has a past surgical  history that includes Joint replacement (Right); Joint replacement; Cholecystectomy; Abdominal hysterectomy; Shoulder arthroscopy with rotator cuff repair and subacromial decompression (Right, 06/14/2015); Esophagogastroduodenoscopy (egd) with propofol (N/A, 10/11/2015); Savory dilation (N/A, 10/11/2015); Colonoscopy w/ polypectomy; Esophagogastroduodenoscopy (egd) with propofol (N/A, 12/10/2017); Colonoscopy with propofol (N/A, 12/10/2017); Total knee arthroplasty (Left, 03/06/2021); Joint replacement (Left); and Esophagogastroduodenoscopy (egd) with propofol (N/A, 05/03/2022). Jacqueline Delgado has a current medication list which includes the following prescription(s): cetirizine, dexlansoprazole, fluoxetine, hydrochlorothiazide, losartan, meloxicam, ondansetron, potassium chloride, ozempic (0.25 or 0.5 mg/dose), trazodone, and valacyclovir. Her primarily concern today is the No chief complaint on file.  Initial Vital Signs:  Pulse/HCG Rate:    Temp:   Resp:   BP:   SpO2:    BMI: Estimated body mass index is 42.96 kg/m as calculated from the following:   Height as of 05/03/22: '5\' 3"'$  (1.6 m).   Weight as of 05/03/22: 242 lb 8.1 oz (110 kg).  Risk Assessment: Allergies: Reviewed. She has No Known Allergies.  Allergy Precautions: None required Coagulopathies: Reviewed. None identified.  Blood-thinner therapy: None at this time Active Infection(s): Reviewed. None identified. Jacqueline Delgado is afebrile  Site Confirmation: Jacqueline Delgado was asked to confirm the procedure and laterality before marking the site Procedure checklist: Completed Consent: Before the procedure and under the influence of no sedative(s), amnesic(s), or anxiolytics, the patient was informed of the treatment options, risks and possible complications. To fulfill our ethical and legal obligations, as recommended by the American Medical Association's Code of Ethics, I have informed the patient of my clinical impression; the nature and purpose of  the treatment or procedure; the risks, benefits, and possible complications of the intervention; the alternatives, including doing nothing;  the risk(s) and benefit(s) of the alternative treatment(s) or procedure(s); and the risk(s) and benefit(s) of doing nothing. The patient was provided information about the general risks and possible complications associated with the procedure. These may include, but are not limited to: failure to achieve desired goals, infection, bleeding, organ or nerve damage, allergic reactions, paralysis, and death. In addition, the patient was informed of those risks and complications associated to Spine-related procedures, such as failure to decrease pain; infection (i.e.: Meningitis, epidural or intraspinal abscess); bleeding (i.e.: epidural hematoma, subarachnoid hemorrhage, or any other type of intraspinal or peri-dural bleeding); organ or nerve damage (i.e.: Any type of peripheral nerve, nerve root, or spinal cord injury) with subsequent damage to sensory, motor, and/or autonomic systems, resulting in permanent pain, numbness, and/or weakness of one or several areas of the body; allergic reactions; (i.e.: anaphylactic reaction); and/or death. Furthermore, the patient was informed of those risks and complications associated with the medications. These include, but are not limited to: allergic reactions (i.e.: anaphylactic or anaphylactoid reaction(s)); adrenal axis suppression; blood sugar elevation that in diabetics may result in ketoacidosis or comma; water retention that in patients with history of congestive heart failure may result in shortness of breath, pulmonary edema, and decompensation with resultant heart failure; weight gain; swelling or edema; medication-induced neural toxicity; particulate matter embolism and blood vessel occlusion with resultant organ, and/or nervous system infarction; and/or aseptic necrosis of one or more joints. Finally, the patient was informed  that Medicine is not an exact science; therefore, there is also the possibility of unforeseen or unpredictable risks and/or possible complications that may result in a catastrophic outcome. The patient indicated having understood very clearly. We have given the patient no guarantees and we have made no promises. Enough time was given to the patient to ask questions, all of which were answered to the patient's satisfaction. Jacqueline Delgado has indicated that she wanted to continue with the procedure. Attestation: I, the ordering provider, attest that I have discussed with the patient the benefits, risks, side-effects, alternatives, likelihood of achieving goals, and potential problems during recovery for the procedure that I have provided informed consent. Date  Time: {CHL [facility]-PAIN TIME CHOICES:[id number]}  Pre-Procedure Preparation:  Monitoring: As per clinic protocol. Respiration, ETCO2, SpO2, BP, heart rate and rhythm monitor placed and checked for adequate function Safety Precautions: Patient was assessed for positional comfort and pressure points before starting the procedure. Time-out: I initiated and conducted the "Time-out" before starting the procedure, as per protocol. The patient was asked to participate by confirming the accuracy of the "Time Out" information. Verification of the correct person, site, and procedure were performed and confirmed by me, the nursing staff, and the patient. "Time-out" conducted as per Joint Commission's Universal Protocol (UP.01.01.01). Time:    Description of Procedure:          Laterality: Bilateral. The procedure was performed in identical fashion on both sides. Targeted Levels:  L2, L3, L4, L5, & S1 Medial Branch Level(s)  Safety Precautions: Aspiration looking for blood return was conducted prior to all injections. At no point did we inject any substances, as a needle was being advanced. Before injecting, the patient was told to immediately notify me if she was  experiencing any new onset of "ringing in the ears, or metallic taste in the mouth". No attempts were made at seeking any paresthesias. Safe injection practices and needle disposal techniques used. Medications properly checked for expiration dates. SDV (single dose vial) medications used. After the completion of  the procedure, all disposable equipment used was discarded in the proper designated medical waste containers. Local Anesthesia: Protocol guidelines were followed. The patient was positioned over the fluoroscopy table. The area was prepped in the usual manner. The time-out was completed. The target area was identified using fluoroscopy. A 12-in long, straight, sterile hemostat was used with fluoroscopic guidance to locate the targets for each level blocked. Once located, the skin was marked with an approved surgical skin marker. Once all sites were marked, the skin (epidermis, dermis, and hypodermis), as well as deeper tissues (fat, connective tissue and muscle) were infiltrated with a small amount of a short-acting local anesthetic, loaded on a 10cc syringe with a 25G, 1.5-in  Needle. An appropriate amount of time was allowed for local anesthetics to take effect before proceeding to the next step. Local Anesthetic: Lidocaine 2.0% The unused portion of the local anesthetic was discarded in the proper designated containers. Technical description of process:  L2 Medial Branch Nerve Block (MBB): The target area for the L2 medial branch is at the junction of the postero-lateral aspect of the superior articular process and the superior, posterior, and medial edge of the transverse process of L3. Under fluoroscopic guidance, a Quincke needle was inserted until contact was made with os over the superior postero-lateral aspect of the pedicular shadow (target area). After negative aspiration for blood, 0.5 mL of the nerve block solution was injected without difficulty or complication. The needle was removed  intact. L3 Medial Branch Nerve Block (MBB): The target area for the L3 medial branch is at the junction of the postero-lateral aspect of the superior articular process and the superior, posterior, and medial edge of the transverse process of L4. Under fluoroscopic guidance, a Quincke needle was inserted until contact was made with os over the superior postero-lateral aspect of the pedicular shadow (target area). After negative aspiration for blood, 0.5 mL of the nerve block solution was injected without difficulty or complication. The needle was removed intact. L4 Medial Branch Nerve Block (MBB): The target area for the L4 medial branch is at the junction of the postero-lateral aspect of the superior articular process and the superior, posterior, and medial edge of the transverse process of L5. Under fluoroscopic guidance, a Quincke needle was inserted until contact was made with os over the superior postero-lateral aspect of the pedicular shadow (target area). After negative aspiration for blood, 0.5 mL of the nerve block solution was injected without difficulty or complication. The needle was removed intact. L5 Medial Branch Nerve Block (MBB): The target area for the L5 medial branch is at the junction of the postero-lateral aspect of the superior articular process and the superior, posterior, and medial edge of the sacral ala. Under fluoroscopic guidance, a Quincke needle was inserted until contact was made with os over the superior postero-lateral aspect of the pedicular shadow (target area). After negative aspiration for blood, 0.5 mL of the nerve block solution was injected without difficulty or complication. The needle was removed intact. S1 Medial Branch Nerve Block (MBB): The target area for the S1 medial branch is at the posterior and inferior 6 o'clock position of the L5-S1 facet joint. Under fluoroscopic guidance, the Quincke needle inserted for the L5 MBB was redirected until contact was made with  os over the inferior and postero aspect of the sacrum, at the 6 o' clock position under the L5-S1 facet joint (Target area). After negative aspiration for blood, 0.5 mL of the nerve block solution was injected without  difficulty or complication. The needle was removed intact.  Once the entire procedure was completed, the treated area was cleaned, making sure to leave some of the prepping solution back to take advantage of its long term bactericidal properties.         Illustration of the posterior view of the lumbar spine and the posterior neural structures. Laminae of L2 through S1 are labeled. DPRL5, dorsal primary ramus of L5; DPRS1, dorsal primary ramus of S1; DPR3, dorsal primary ramus of L3; FJ, facet (zygapophyseal) joint L3-L4; I, inferior articular process of L4; LB1, lateral branch of dorsal primary ramus of L1; IAB, inferior articular branches from L3 medial branch (supplies L4-L5 facet joint); IBP, intermediate branch plexus; MB3, medial branch of dorsal primary ramus of L3; NR3, third lumbar nerve root; S, superior articular process of L5; SAB, superior articular branches from L4 (supplies L4-5 facet joint also); TP3, transverse process of L3.  There were no vitals filed for this visit.   Start Time:   hrs. End Time:   hrs.  Imaging Guidance (Spinal):          Type of Imaging Technique: Fluoroscopy Guidance (Spinal) Indication(s): Assistance in needle guidance and placement for procedures requiring needle placement in or near specific anatomical locations not easily accessible without such assistance. Exposure Time: Please see nurses notes. Contrast: None used. Fluoroscopic Guidance: I was personally present during the use of fluoroscopy. "Tunnel Vision Technique" used to obtain the best possible view of the target area. Parallax error corrected before commencing the procedure. "Direction-depth-direction" technique used to introduce the needle under continuous pulsed fluoroscopy.  Once target was reached, antero-posterior, oblique, and lateral fluoroscopic projection used confirm needle placement in all planes. Images permanently stored in EMR. Interpretation: No contrast injected. I personally interpreted the imaging intraoperatively. Adequate needle placement confirmed in multiple planes. Permanent images saved into the patient's record.  Antibiotic Prophylaxis:   Anti-infectives (From admission, onward)    None      Indication(s): None identified  Post-operative Assessment:  Post-procedure Vital Signs:  Pulse/HCG Rate:    Temp:   Resp:   BP:   SpO2:    EBL: None  Complications: No immediate post-treatment complications observed by team, or reported by patient.  Note: The patient tolerated the entire procedure well. A repeat set of vitals were taken after the procedure and the patient was kept under observation following institutional policy, for this type of procedure. Post-procedural neurological assessment was performed, showing return to baseline, prior to discharge. The patient was provided with post-procedure discharge instructions, including a section on how to identify potential problems. Should any problems arise concerning this procedure, the patient was given instructions to immediately contact us, at any time, without hesitation. In any case, we plan to contact the patient by telephone for a follow-up status report regarding this interventional procedure.  Comments:  No additional relevant information.  Plan of Care  Orders:  No orders of the defined types were placed in this encounter.  Chronic Opioid Analgesic:  Tramadol 50 mg tablet, 1 tab p.o. twice daily (PRN) MME/day: 0-10 mg/day   Medications ordered for procedure: No orders of the defined types were placed in this encounter.  Medications administered: Donnielle Addison. Vickrey had no medications administered during this visit.  See the medical record for exact dosing, route, and time of  administration.  Follow-up plan:   No follow-ups on file.       Interventional Therapies  Risk  Complexity Considerations:   Estimated  body mass index is 42.34 kg/m as calculated from the following:   Height as of this encounter: '5\' 3"'$  (1.6 m).   Weight as of this encounter: 239 lb (108.4 kg). Note: No RFA until BMI is below 35 kg/m.   Planned  Pending:   Second referral to bariatric surgery and medical weight management   Under consideration:   None   Completed:   Therapeutic/palliative bilateral lumbar facet MBB x3 (08/30/2021) (100/100/100/0) pain worsened after injection. Therapeutic right L4-5 LESI x1 (07/27/2020)    Therapeutic  Palliative (PRN) options:   Palliative bilateral lumbar facet MBB #4  Therapeutic right L4-5 LESI #2       Recent Visits Date Type Provider Dept  04/22/22 Office Visit Milinda Pointer, MD [facility]  Showing recent visits within past 90 days and meeting all other requirements Future Appointments Date Type Provider Dept  05/23/22 Appointment Milinda Pointer, MD [facility]  Showing future appointments within next 90 days and meeting all other requirements  Disposition: Discharge home  Discharge (Date  Time): 05/23/2022;   hrs.   Primary Care Physician: Burnard Hawthorne, FNP Location: [facility] Note by: Gaspar Cola, MD Date: 05/23/2022; Time: 5:47 AM  Disclaimer:  Medicine is not an Chief Strategy Officer. The only guarantee in medicine is that nothing is guaranteed. It is important to note that the decision to proceed with this intervention was based on the information collected from the patient. The Data and conclusions were drawn from the patient's questionnaire, the interview, and the physical examination. Because the information was provided in large part by the patient, it cannot be guaranteed that it has not been purposely or unconsciously manipulated. Every effort has been  made to obtain as much relevant data as possible for this evaluation. It is important to note that the conclusions that lead to this procedure are derived in large part from the available data. Always take into account that the treatment will also be dependent on availability of resources and existing treatment guidelines, considered by other Pain Management Practitioners as being common knowledge and practice, at the time of the intervention. For Medico-Legal purposes, it is also important to point out that variation in procedural techniques and pharmacological choices are the acceptable norm. The indications, contraindications, technique, and results of the above procedure should only be interpreted and judged by a Board-Certified Interventional Pain Specialist with extensive familiarity and expertise in the same exact procedure and technique.

## 2022-05-23 ENCOUNTER — Encounter: Payer: Self-pay | Admitting: Pain Medicine

## 2022-05-23 ENCOUNTER — Ambulatory Visit: Payer: Medicare Other | Attending: Pain Medicine | Admitting: Pain Medicine

## 2022-05-23 ENCOUNTER — Ambulatory Visit
Admission: RE | Admit: 2022-05-23 | Discharge: 2022-05-23 | Disposition: A | Discharge status: Home / Self Care | Payer: Medicare Other | Source: Ambulatory Visit | Attending: Pain Medicine | Admitting: Pain Medicine

## 2022-05-23 VITALS — BP 132/94 | HR 71 | Temp 97.5°F | Resp 15 | Ht 63.0 in | Wt 243.0 lb

## 2022-05-23 DIAGNOSIS — Z6841 Body Mass Index (BMI) 40.0 and over, adult: Secondary | ICD-10-CM | POA: Diagnosis present

## 2022-05-23 DIAGNOSIS — M47816 Spondylosis without myelopathy or radiculopathy, lumbar region: Secondary | ICD-10-CM | POA: Diagnosis not present

## 2022-05-23 DIAGNOSIS — M545 Low back pain, unspecified: Secondary | ICD-10-CM | POA: Diagnosis not present

## 2022-05-23 DIAGNOSIS — M5137 Other intervertebral disc degeneration, lumbosacral region: Secondary | ICD-10-CM | POA: Diagnosis not present

## 2022-05-23 DIAGNOSIS — M47817 Spondylosis without myelopathy or radiculopathy, lumbosacral region: Secondary | ICD-10-CM | POA: Diagnosis not present

## 2022-05-23 DIAGNOSIS — M4310 Spondylolisthesis, site unspecified: Secondary | ICD-10-CM

## 2022-05-23 DIAGNOSIS — G8929 Other chronic pain: Secondary | ICD-10-CM | POA: Diagnosis not present

## 2022-05-23 DIAGNOSIS — M51379 Other intervertebral disc degeneration, lumbosacral region without mention of lumbar back pain or lower extremity pain: Secondary | ICD-10-CM

## 2022-05-23 MED ORDER — LIDOCAINE HCL 2 % IJ SOLN
20.0000 mL | Freq: Once | INTRAMUSCULAR | Status: AC
  Administered 2022-05-23: 400 mg

## 2022-05-23 MED ORDER — ROPIVACAINE HCL 2 MG/ML IJ SOLN
18.0000 mL | Freq: Once | INTRAMUSCULAR | Status: AC
  Administered 2022-05-23: 18 mL via PERINEURAL

## 2022-05-23 MED ORDER — MIDAZOLAM HCL 5 MG/5ML IJ SOLN
0.5000 mg | Freq: Once | INTRAMUSCULAR | Status: AC
  Administered 2022-05-23: 2 mg via INTRAVENOUS

## 2022-05-23 MED ORDER — LACTATED RINGERS IV SOLN: Freq: Once | INTRAVENOUS | Status: AC

## 2022-05-23 MED ORDER — MIDAZOLAM HCL 5 MG/5ML IJ SOLN
INTRAMUSCULAR | Status: AC
  Filled 2022-05-23: qty 5

## 2022-05-23 MED ORDER — LIDOCAINE HCL 2 % IJ SOLN
INTRAMUSCULAR | Status: AC
  Filled 2022-05-23: qty 20

## 2022-05-23 MED ORDER — TRIAMCINOLONE ACETONIDE 40 MG/ML IJ SUSP
80.0000 mg | Freq: Once | INTRAMUSCULAR | Status: AC
  Administered 2022-05-23: 80 mg

## 2022-05-23 MED ORDER — TRIAMCINOLONE ACETONIDE 40 MG/ML IJ SUSP
INTRAMUSCULAR | Status: AC
  Filled 2022-05-23: qty 2

## 2022-05-23 MED ORDER — FENTANYL CITRATE (PF) 100 MCG/2ML IJ SOLN: 25.0000 ug | INTRAMUSCULAR | Status: DC | PRN

## 2022-05-23 MED ORDER — FENTANYL CITRATE (PF) 100 MCG/2ML IJ SOLN
INTRAMUSCULAR | Status: AC
  Filled 2022-05-23: qty 2

## 2022-05-23 MED ORDER — ROPIVACAINE HCL 2 MG/ML IJ SOLN
INTRAMUSCULAR | Status: AC
  Filled 2022-05-23: qty 20

## 2022-05-23 NOTE — Patient Instructions (Addendum)
____________________________________________________________________________________________  Virtual Visits   What is a "Virtual Visit"? It is a healthcare communication encounter (medical visit) that takes place on real time (NOT TEXT or E-MAIL) over the telephone or computer device (desktop, laptop, tablet, smart phone, etc.). It allows for more location flexibility between the patient and the healthcare provider.  Who decides when these types of visits will be used? The physician.  Who is eligible for these types of visits? Only those patients that can be reliably reached over the telephone.  What do you mean by reliably? We do not have time to call everyone multiple times, therefore those that tend to screen calls and then call back later are not suitable candidates for this system. We understand how people are reluctant to pickup on "unknown" calls, therefore, we suggest adding our telephone numbers to your list of "CONTACT(s)". This way, you should be able to readily identify our calls when you receive one. All of our numbers are available below.   Who is not eligible? This option is not available for medication management encounters, specially for controlled substances. Patients on pain medications that fall under the category of controlled substances have to come in for "Face-to-Face" encounters. This is required for mandatory monitoring of these substances. You may be asked to provide a sample for an unannounced urine drug screening test (UDS), and we will need to count your pain pills. Not bringing your pills to be counted may result in no refill. Obviously, neither one of these can be done over the phone.  When will this type of visits be used? You can request a virtual visit whenever you are physically unable to attend a regular appointment. The decision will be made by the physician (or healthcare provider) on a case by case basis.   At what time will I be called? This is an  excellent question. The providers will try to call you whenever they have time available. Do not expect to be called at any specific time. The secretaries will assign you a time for your virtual visit appointment, but this is done simply to keep a list of those patients that need to be called, but not for the purpose of keeping a time schedule. Be advised that the call may come in anytime during the day, between the hours of 8:00 AM and 8::00 PM, depending on provider availability. We do understand that the system is not perfect. If you are unable to be available that day on a moments notice, then request an "in-person" appointment rather than a "virtual visit".  Can I request my medication visits to be "Virtual"? Yes you may request it, but the decision is entirely up to the healthcare provider. Control substances require specific monitoring that requires Face-to-Face encounters. The number of encounters  and the extent of the monitoring is determined on a case by case basis.  Add a new contact to your smart phone and label it "PAIN CLINIC" Under this contact add the following numbers: Main: [phone] (Official Contact Number) Nurses: [phone] (These are outgoing only calling systems. Do not call this number.) Dr. Naveira: [phone] or [phone] (Outgoing calls only. Do not call this number.)  ____________________________________________________________________________________________    ____________________________________________________________________________________________  Post-Procedure Discharge Instructions  Instructions: Apply ice:  Purpose: This will minimize any swelling and discomfort after procedure.  When: Day of procedure, as soon as you get home. How: Fill a plastic sandwich bag with crushed ice. Cover it with a small towel and apply   to injection site. How long: (15 min on, 15 min off) Apply for 15 minutes then remove x 15 minutes.  Repeat sequence on  day of procedure, until you go to bed. Apply heat:  Purpose: To treat any soreness and discomfort from the procedure. When: Starting the next day after the procedure. How: Apply heat to procedure site starting the day following the procedure. How long: May continue to repeat daily, until discomfort goes away. Food intake: Start with clear liquids (like water) and advance to regular food, as tolerated.  Physical activities: Keep activities to a minimum for the first 8 hours after the procedure. After that, then as tolerated. Driving: If you have received any sedation, be responsible and do not drive. You are not allowed to drive for 24 hours after having sedation. Blood thinner: (Applies only to those taking blood thinners) You may restart your blood thinner 6 hours after your procedure. Insulin: (Applies only to Diabetic patients taking insulin) As soon as you can eat, you may resume your normal dosing schedule. Infection prevention: Keep procedure site clean and dry. Shower daily and clean area with soap and water. Post-procedure Pain Diary: Extremely important that this be done correctly and accurately. Recorded information will be used to determine the next step in treatment. For the purpose of accuracy, follow these rules: Evaluate only the area treated. Do not report or include pain from an untreated area. For the purpose of this evaluation, ignore all other areas of pain, except for the treated area. After your procedure, avoid taking a long nap and attempting to complete the pain diary after you wake up. Instead, set your alarm clock to go off every hour, on the hour, for the initial 8 hours after the procedure. Document the duration of the numbing medicine, and the relief you are getting from it. Do not go to sleep and attempt to complete it later. It will not be accurate. If you received sedation, it is likely that you were given a medication that may cause amnesia. Because of this,  completing the diary at a later time may cause the information to be inaccurate. This information is needed to plan your care. Follow-up appointment: Keep your post-procedure follow-up evaluation appointment after the procedure (usually 2 weeks for most procedures, 6 weeks for radiofrequencies). DO NOT FORGET to bring you pain diary with you.   Expect: (What should I expect to see with my procedure?) From numbing medicine (AKA: Local Anesthetics): Numbness or decrease in pain. You may also experience some weakness, which if present, could last for the duration of the local anesthetic. Onset: Full effect within 15 minutes of injected. Duration: It will depend on the type of local anesthetic used. On the average, 1 to 8 hours.  From steroids (Applies only if steroids were used): Decrease in swelling or inflammation. Once inflammation is improved, relief of the pain will follow. Onset of benefits: Depends on the amount of swelling present. The more swelling, the longer it will take for the benefits to be seen. In some cases, up to 10 days. Duration: Steroids will stay in the system x 2 weeks. Duration of benefits will depend on multiple posibilities including persistent irritating factors. Side-effects: If present, they may typically last 2 weeks (the duration of the steroids). Frequent: Cramps (if they occur, drink Gatorade and take over-the-counter Magnesium 450-500 mg once to twice a day); water retention with temporary weight gain; increases in blood sugar; decreased immune system response; increased appetite. Occasional: Facial flushing (  red, warm cheeks); mood swings; menstrual changes. Uncommon: Long-term decrease or suppression of natural hormones; bone thinning. (These are more common with higher doses or more frequent use. This is why we prefer that our patients avoid having any injection therapies in other practices.)  Very Rare: Severe mood changes; psychosis; aseptic necrosis. From  procedure: Some discomfort is to be expected once the numbing medicine wears off. This should be minimal if ice and heat are applied as instructed.  Call if: (When should I call?) You experience numbness and weakness that gets worse with time, as opposed to wearing off. New onset bowel or bladder incontinence. (Applies only to procedures done in the spine)  Emergency Numbers: Durning business hours (Monday - Thursday, 8:00 AM - 4:00 PM) (Friday, 9:00 AM - 12:00 Noon): [phone] After hours: [phone] NOTE: If you are having a problem and are unable connect with, or to talk to a provider, then go to your nearest urgent care or emergency department. If the problem is serious and urgent, please call 911. ____________________________________________________________________________________________    

## 2022-05-24 ENCOUNTER — Telehealth: Payer: Self-pay

## 2022-05-24 NOTE — Telephone Encounter (Signed)
Post procedure phone call.  Patient states she is doing great 

## 2022-06-06 ENCOUNTER — Encounter: Payer: Self-pay | Admitting: Family

## 2022-06-06 ENCOUNTER — Telehealth (INDEPENDENT_AMBULATORY_CARE_PROVIDER_SITE_OTHER): Payer: Medicare Other | Admitting: Family

## 2022-06-06 VITALS — BP 129/71 | Ht 64.0 in | Wt 243.3 lb

## 2022-06-06 DIAGNOSIS — N951 Menopausal and female climacteric states: Secondary | ICD-10-CM | POA: Diagnosis not present

## 2022-06-06 DIAGNOSIS — I10 Essential (primary) hypertension: Secondary | ICD-10-CM

## 2022-06-06 MED ORDER — PAROXETINE HCL 10 MG PO TABS: 10.0000 mg | ORAL_TABLET | Freq: Every day | ORAL | 3 refills | Status: DC

## 2022-06-06 NOTE — Patient Instructions (Addendum)
Start paxil '10mg'$  for hot flashes  Call to schedule chest xray and non fasting labs  Please call  and schedule your 3D mammogram and /or bone density scan as we discussed.   [facility]  ( new location in 2023)  [address], [place], [place] [address]  [place], [place]

## 2022-06-06 NOTE — Progress Notes (Signed)
Verbal consent for services obtained from patient prior to services given to TELEPHONE visit:   Location of call:  provider at work patient at home  Names of all persons present for services: Mable Paris, NP and patient CC: she has started to have hot flashes as night for the past couple of weeks. She is fanning herself with some relief. Episodes last for 2 seconds to 1 minute. Sheets are drenched.    No fever, weight loss, unusual bone pain, cough.   TSH normal 8 months ago.   She is no longer on Prozac 20 mg  as she has felt she no longer needed. Depression has improved.   HTN-compliant with losartan 50 mg, hydrochlorothiazide 25 mg  Following with pain management  EGD dr Haig Prophet 05/03/22 with small hiatal hernia, normal eosphagus.   Wt Readings from Last 3 Encounters:  06/06/22 243 lb 4.8 oz (110.4 kg)  05/23/22 243 lb (110.2 kg)  05/03/22 242 lb 8.1 oz (110 kg)     A/P/next steps:  Problem List Items Addressed This Visit       Cardiovascular and Mediastinum   Essential hypertension    Chronic, stable. Continue losartan 50 mg, hydrochlorothiazide 25 mg      Relevant Orders   Basic metabolic panel     Other   Vasomotor symptoms due to menopause - Primary    Symptoms most consistent with menopause.  Pending labs, chest x-ray for thoroughness.  Patient willing to trial Paxil 10 mg.  Close follow-up      Relevant Medications   PARoxetine (PAXIL) 10 MG tablet   Other Relevant Orders   CBC with Differential/Platelet   TSH   FSH   DG Chest 2 View   Prolactin     I spent 15 min  discussing plan of care over the phone.

## 2022-06-09 NOTE — Progress Notes (Unsigned)
Patient: Jacqueline Delgado  Service Category: E/M  Provider: Gaspar Cola, MD  DOB: Feb 13, 1951  DOS: 06/11/2022  Location: Office  MRN: [id number]  Setting: Ambulatory outpatient  Referring Provider: Burnard Hawthorne, FNP  Type: Established Patient  Specialty: Interventional Pain Management  PCP: Burnard Hawthorne, FNP  Location: Remote location  Delivery: TeleHealth     Virtual Encounter - Pain Management PROVIDER NOTE: Information contained herein reflects review and annotations entered in association with encounter. Interpretation of such information and data should be left to medically-trained personnel. Information provided to patient can be located elsewhere in the medical record under "Patient Instructions". Document created using STT-dictation technology, any transcriptional errors that may result from process are unintentional.    Contact & Pharmacy Preferred: [phone] Home: [phone] (home) Mobile: [phone] (mobile) E-mail: [email or web address]  [id number] [place]Phone: [phone]: [phone] [facility] ([place] [facility] [place]Phone: [phone]: [phone]  Pre-screening  Ms. Herford offered "in-person" vs "virtual" encounter. She indicated preferring virtual for this encounter.   Reason COVID-19*  Social distancing based on CDC and AMA recommendations.   I contacted SVANNIE HILGERTon 06/11/2022 via telephone.      I clearly identified myself as FGaspar Cola MD. I verified that I was speaking with the correct person using two identifiers (Name: SLIZBETH FEIJOO and date of birth: [birth year].  Consent I sought verbal advanced consent from [name]. I informed Ms. Lewin of possible security and privacy concerns, risks, and  limitations associated with providing "not-in-person" medical evaluation and management services. I also informed Ms. Bazzle of the availability of "in-person" appointments. Finally, I informed her that there would be a charge for the virtual visit and that she could be  personally, fully or partially, financially responsible for it. Ms. SRabenexpressed understanding and agreed to proceed.   Historic Elements   Ms. SNEKEYA BRISKIis a 71y.o. year old, female patient evaluated today after our last contact on 05/23/2022. Ms. SFaust has a past medical history of Anemia, Anginal pain ([place], Arthritis, Colon polyp, GERD (gastroesophageal reflux disease), Hiatal hernia, History of hiatal hernia, History of methicillin resistant staphylococcus aureus (MRSA), Hypertension, Obesity, Pre-diabetes, Reflux, Sleep apnea, and Small vessel disease ([place]. She also  has a past surgical history that includes Joint replacement (Right); Joint replacement; Cholecystectomy; Abdominal hysterectomy; Shoulder arthroscopy with rotator cuff repair and subacromial decompression (Right, 06/14/2015); Esophagogastroduodenoscopy (egd) with propofol (N/A, 10/11/2015); Savory dilation (N/A, 10/11/2015); Colonoscopy w/ polypectomy; Esophagogastroduodenoscopy (egd) with propofol (N/A, 12/10/2017); Colonoscopy with propofol (N/A, 12/10/2017); Total knee arthroplasty (Left, 03/06/2021); Joint replacement (Left); and Esophagogastroduodenoscopy (egd) with propofol (N/A, 05/03/2022). Ms. SBureauhas a current medication list which includes the following prescription(s): hydrochlorothiazide, losartan, meloxicam, ondansetron, paroxetine, potassium chloride, rabeprazole, trulicity, and valacyclovir. She  reports that she has never smoked. She has never used smokeless tobacco. She reports that she does not drink alcohol and does not use drugs. Ms. SMaiorinohas No Known Allergies.   HPI  Today, she is being contacted for a post-procedure assessment.  The  patient refers to again have attained 100% relief of the pain for the duration of the local anesthetic, followed by a drop to a 98% improvement which was subsequently followed by an increase improvement to an ongoing  100% relief at the time of this call.  Today I have provided the patient with information regarding Preemptive Analgesia and I have also encouraged the patient to continue losing weight.  Since she is 5 feet 3 inches tall, I have informed her that her goal should be to bring her weight down to no more than 170 pounds.  Post-procedure evaluation   Type: Lumbar Facet, Medial Branch Block(s) #4  Laterality: Bilateral  Level: L2, L3, L4, L5, & S1 Medial Branch Level(s). Injecting these levels blocks the L3-4, L4-5 and L5-S1 lumbar facet joints. Imaging: Fluoroscopic guidance         Anesthesia: Local anesthesia (1-2% Lidocaine) Anxiolysis: IV Versed         Sedation: Moderate conscious sedation. DOS: 05/23/2022 Performed by: Gaspar Cola, MD  Primary Purpose: Diagnostic/Therapeutic Indications: Low back pain severe enough to impact quality of life or function. 1. Lumbosacral facet syndrome (Bilateral) (R>L)   2. Spondylosis without myelopathy or radiculopathy, lumbosacral region   3. Lumbar Grade 1 Anterolisthesis of L4/L5   4. Lumbar facet hypertrophy   5. DDD (degenerative disc disease), lumbosacral   6. Chronic low back pain (Bilateral) (R>L) w/o sciatica   7. Morbid obesity with BMI of 40.0-44.9, adult (HCC)    NAS-11 Pain score:   Pre-procedure: 5 /10   Post-procedure: 0-No pain/10      Effectiveness:  Initial hour after procedure: 100 %. Subsequent 4-6 hours post-procedure: 100 %. Analgesia past initial 6 hours: 98 % (pain relief was very good up until about 3  days ago.  increased acitivity levels cause the pain or bending too much.). Ongoing improvement:  Analgesic: Currently the patient indicates that she is doing much better than when the nurses called her.   She states currently having an ongoing 100% relief of her low back pain.  However, she has identified that mopping, doing the dishes, and several other home chores tend to bring the pain back.  Today I have provided the patient with some information regarding Preemptive Analgesia which I have added to her after visit summary in the form of "pain prevention techniques". Function: Ms. Fassnacht reports improvement in function ROM: Ms. Mudrick reports improvement in ROM  Pharmacotherapy Assessment   Opioid Analgesic: Tramadol 50 mg tablet, 1 tab p.o. twice daily (PRN) MME/day: 0-10 mg/day   Monitoring: [place] PMP: PDMP reviewed during this encounter.       Pharmacotherapy: No side-effects or adverse reactions reported. Compliance: No problems identified. Effectiveness: Clinically acceptable. Plan: Refer to "POC". UDS:  Summary  Date Value Ref Range Status  05/22/2020 Note  Final    Comment:    ==================================================================== Compliance Drug Analysis, Ur ==================================================================== Test                             Result       Flag       Units  Drug Present and Declared for Prescription Verification   Tramadol                       >3268        EXPECTED   ng/mg creat   O-Desmethyltramadol            2372         EXPECTED   ng/mg creat   N-Desmethyltramadol            1509  EXPECTED   ng/mg creat    Source of tramadol is a prescription medication. O-desmethyltramadol    and N-desmethyltramadol are expected metabolites of tramadol.    Bupropion                      PRESENT      EXPECTED   Hydroxybupropion               PRESENT      EXPECTED    Hydroxybupropion is an expected metabolite of bupropion.  Drug Present not Declared for Prescription Verification   Dextromethorphan               PRESENT      UNEXPECTED   Dextrorphan/Levorphanol        PRESENT      UNEXPECTED    Dextrorphan is an expected metabolite  of dextromethorphan, an over-    the-counter or prescription cough suppressant. Dextrorphan cannot be    distinguished from the scheduled prescription medication levorphanol    by the method used for analysis.  ==================================================================== Test                      Result    Flag   Units      Ref Range   Creatinine              153              mg/dL      >=20 ==================================================================== Declared Medications:  The flagging and interpretation on this report are based on the  following declared medications.  Unexpected results may arise from  inaccuracies in the declared medications.   **Note: The testing scope of this panel includes these medications:   Bupropion (Wellbutrin)  Tramadol (Ultram)   **Note: The testing scope of this panel does not include the  following reported medications:   Famotidine (Pepcid)  Hydrochlorothiazide (Hydrodiuril)  Losartan (Cozaar)  Meloxicam (Mobic)  Mirabegron (Myrbetriq)  Rabeprazole (Aciphex) ==================================================================== For clinical consultation, please call [phone]. ====================================================================    No results found for: "CBDTHCR", "D8THCCBX", "D9THCCBX"   Laboratory Chemistry Profile   Renal Lab Results  Component Value Date   BUN 18 03/13/2022   CREATININE 0.99 03/13/2022   BCR 20 01/19/2021   GFR 57.94 (L) 03/13/2022   GFRAA 81 09/13/2020   GFRNONAA >60 03/13/2021    Hepatic Lab Results  Component Value Date   AST 11 03/13/2022   ALT 7 03/13/2022   ALBUMIN 4.0 03/13/2022   ALKPHOS 97 03/13/2022   LIPASE 36 10/12/2017    Electrolytes Lab Results  Component Value Date   NA 141 03/13/2022   K 3.7 03/13/2022   CL 103 03/13/2022   CALCIUM 9.8 03/13/2022   MG 2.0 05/22/2020    Bone Lab Results  Component Value Date   VD25OH 27.17 (L) 10/05/2021    25OHVITD1 18 (L) 05/22/2020   25OHVITD2 13 05/22/2020   25OHVITD3 5.3 05/22/2020    Inflammation (CRP: Acute Phase) (ESR: Chronic Phase) Lab Results  Component Value Date   CRP 15 (H) 05/22/2020   ESRSEDRATE 73 (H) 05/22/2020         Note: Above Lab results reviewed.  Imaging  DG PAIN CLINIC C-ARM 1-60 MIN NO REPORT Fluoro was used, but no Radiologist interpretation will be provided.  Please refer to "NOTES" tab for provider progress note.  Assessment  The primary encounter diagnosis was Lumbosacral facet syndrome (Bilateral) (R>L). Diagnoses of  Chronic low back pain (1ry area of Pain) (Bilateral) (R>L) w/ sciatica (Right) and Chronic lower extremity pain (2ry area of Pain) (Bilateral) (R>L) were also pertinent to this visit.  Plan of Care  Problem-specific:  No problem-specific Assessment & Plan notes found for this encounter.  Ms. GENEVRA ORNE has a current medication list which includes the following long-term medication(s): hydrochlorothiazide, losartan, paroxetine, and potassium chloride.  Pharmacotherapy (Medications Ordered): No orders of the defined types were placed in this encounter.  Orders:  No orders of the defined types were placed in this encounter.  Follow-up plan:   No follow-ups on file.     Interventional Therapies  Risk  Complexity Considerations:   Estimated body mass index is 42.34 kg/m as calculated from the following:   Height as of this encounter: 5' 3" (1.6 m).   Weight as of this encounter: 239 lb (108.4 kg). Note: No RFA until BMI is below 35 kg/m.   Planned  Pending:   Second referral to bariatric surgery and medical weight management   Under consideration:   None   Completed:   Therapeutic/palliative bilateral lumbar facet MBB x3 (08/30/2021) (100/100/100/0) pain worsened after injection. Therapeutic right L4-5 LESI x1 (07/27/2020)    Therapeutic  Palliative (PRN) options:   Palliative bilateral lumbar facet MBB #4   Therapeutic right L4-5 LESI #2        Recent Visits Date Type Provider Dept  05/23/22 Procedure visit Milinda Pointer, MD [facility]  04/22/22 Office Visit Milinda Pointer, MD [facility]  Showing recent visits within past 90 days and meeting all other requirements Today's Visits Date Type Provider Dept  06/11/22 Office Visit Milinda Pointer, MD [facility]  Showing today's visits and meeting all other requirements Future Appointments No visits were found meeting these conditions. Showing future appointments within next 90 days and meeting all other requirements  I discussed the assessment and treatment plan with the patient. The patient was provided an opportunity to ask questions and all were answered. The patient agreed with the plan and demonstrated an understanding of the instructions.  Patient advised to call back or seek an in-person evaluation if the symptoms or condition worsens.  Duration of encounter: 18 minutes.  Note by: Gaspar Cola, MD Date: 06/11/2022; Time: 12:29 PM

## 2022-06-10 ENCOUNTER — Telehealth: Payer: Self-pay | Admitting: *Deleted

## 2022-06-10 NOTE — Assessment & Plan Note (Signed)
Chronic, stable. Continue losartan 50 mg, hydrochlorothiazide 25 mg

## 2022-06-10 NOTE — Assessment & Plan Note (Signed)
Symptoms most consistent with menopause.  Pending labs, chest x-ray for thoroughness.  Patient willing to trial Paxil 10 mg.  Close follow-up

## 2022-06-10 NOTE — Telephone Encounter (Signed)
Attempted to call for pre appointment review of allergies/meds. Message left. 

## 2022-06-11 ENCOUNTER — Ambulatory Visit: Payer: Medicare Other | Attending: Pain Medicine | Admitting: Pain Medicine

## 2022-06-11 DIAGNOSIS — G8929 Other chronic pain: Secondary | ICD-10-CM

## 2022-06-11 DIAGNOSIS — M5441 Lumbago with sciatica, right side: Secondary | ICD-10-CM | POA: Diagnosis not present

## 2022-06-11 DIAGNOSIS — M79605 Pain in left leg: Secondary | ICD-10-CM

## 2022-06-11 DIAGNOSIS — M79604 Pain in right leg: Secondary | ICD-10-CM | POA: Diagnosis not present

## 2022-06-11 DIAGNOSIS — M47817 Spondylosis without myelopathy or radiculopathy, lumbosacral region: Secondary | ICD-10-CM

## 2022-06-11 NOTE — Patient Instructions (Signed)
____________________________________________________________________________________________  Pain Prevention Technique  Definition:   A technique used to minimize the effects of an activity known to cause inflammation or swelling, which in turn leads to an increase in pain.  Purpose: To prevent swelling from occurring. It is based on the fact that it is easier to prevent swelling from happening than it is to get rid of it, once it occurs.  Contraindications: Anyone with allergy or hypersensitivity to the recommended medications. Anyone taking anticoagulants (Blood Thinners) (e.g., Coumadin, Warfarin, Plavix, etc.). Patients in Renal Failure.  Technique: Before you undertake an activity known to cause pain, or a flare-up of your chronic pain, and before you experience any pain, do the following:  On a full stomach, take 4 (four) over the counter Ibuprofens '200mg'$  tablets (Motrin), for a total of 800 mg. In addition, take over the counter Magnesium 400 to 500 mg, before doing the activity.  Six (6) hours later, again on a full stomach, repeat the Ibuprofen. That night, take a warm shower and stretch under the running warm water.  This technique may be sufficient to abort the pain and discomfort before it happens. Keep in mind that it takes a lot less medication to prevent swelling than it takes to eliminate it once it occurs.  ____________________________________________________________________________________________

## 2022-07-10 ENCOUNTER — Other Ambulatory Visit (INDEPENDENT_AMBULATORY_CARE_PROVIDER_SITE_OTHER): Payer: Medicare Other

## 2022-07-10 DIAGNOSIS — I10 Essential (primary) hypertension: Secondary | ICD-10-CM | POA: Diagnosis not present

## 2022-07-10 DIAGNOSIS — N951 Menopausal and female climacteric states: Secondary | ICD-10-CM | POA: Diagnosis not present

## 2022-07-10 DIAGNOSIS — R112 Nausea with vomiting, unspecified: Secondary | ICD-10-CM | POA: Diagnosis not present

## 2022-07-10 DIAGNOSIS — S46812A Strain of other muscles, fascia and tendons at shoulder and upper arm level, left arm, initial encounter: Secondary | ICD-10-CM | POA: Diagnosis not present

## 2022-07-10 LAB — CBC WITH DIFFERENTIAL/PLATELET
Basophils Absolute: 0 10*3/uL (ref 0.0–0.1)
Basophils Relative: 0.5 % (ref 0.0–3.0)
Eosinophils Absolute: 0 10*3/uL (ref 0.0–0.7)
Eosinophils Relative: 0.6 % (ref 0.0–5.0)
HCT: 35.3 % — ABNORMAL LOW (ref 36.0–46.0)
Hemoglobin: 11.7 g/dL — ABNORMAL LOW (ref 12.0–15.0)
Lymphocytes Relative: 19.6 % (ref 12.0–46.0)
Lymphs Abs: 1.3 10*3/uL (ref 0.7–4.0)
MCHC: 33 g/dL (ref 30.0–36.0)
MCV: 84.6 fl (ref 78.0–100.0)
Monocytes Absolute: 0.6 10*3/uL (ref 0.1–1.0)
Monocytes Relative: 8.6 % (ref 3.0–12.0)
Neutro Abs: 4.8 10*3/uL (ref 1.4–7.7)
Neutrophils Relative %: 70.7 % (ref 43.0–77.0)
Platelets: 221 10*3/uL (ref 150.0–400.0)
RBC: 4.18 Mil/uL (ref 3.87–5.11)
RDW: 15.4 % (ref 11.5–15.5)
WBC: 6.8 10*3/uL (ref 4.0–10.5)

## 2022-07-10 LAB — BASIC METABOLIC PANEL
BUN: 16 mg/dL (ref 6–23)
CO2: 32 mEq/L (ref 19–32)
Calcium: 9.6 mg/dL (ref 8.4–10.5)
Chloride: 102 mEq/L (ref 96–112)
Creatinine, Ser: 1 mg/dL (ref 0.40–1.20)
GFR: 57.12 mL/min — ABNORMAL LOW (ref >60.00)
Glucose, Bld: 87 mg/dL (ref 70–99)
Potassium: 3.5 mEq/L (ref 3.5–5.1)
Sodium: 142 mEq/L (ref 135–145)

## 2022-07-10 LAB — FOLLICLE STIMULATING HORMONE: FSH: 25.6 m[IU]/mL

## 2022-07-10 LAB — TSH: TSH: 2.36 u[IU]/mL (ref 0.35–5.50)

## 2022-07-11 LAB — PROLACTIN: Prolactin: 7.8 ng/mL

## 2022-07-12 ENCOUNTER — Ambulatory Visit: Payer: Medicare Other | Admitting: Family

## 2022-07-15 ENCOUNTER — Encounter: Payer: Self-pay | Admitting: Family

## 2022-07-15 ENCOUNTER — Other Ambulatory Visit: Payer: Self-pay | Admitting: Family

## 2022-07-15 ENCOUNTER — Telehealth: Payer: Self-pay | Admitting: Family

## 2022-07-15 ENCOUNTER — Ambulatory Visit (INDEPENDENT_AMBULATORY_CARE_PROVIDER_SITE_OTHER): Payer: Medicare Other | Admitting: Family

## 2022-07-15 VITALS — BP 122/82 | HR 112 | Temp 98.6°F | Ht 63.0 in | Wt 253.2 lb

## 2022-07-15 DIAGNOSIS — D649 Anemia, unspecified: Secondary | ICD-10-CM

## 2022-07-15 DIAGNOSIS — R7303 Prediabetes: Secondary | ICD-10-CM

## 2022-07-15 DIAGNOSIS — F331 Major depressive disorder, recurrent, moderate: Secondary | ICD-10-CM

## 2022-07-15 DIAGNOSIS — M25512 Pain in left shoulder: Secondary | ICD-10-CM

## 2022-07-15 DIAGNOSIS — N951 Menopausal and female climacteric states: Secondary | ICD-10-CM | POA: Diagnosis not present

## 2022-07-15 DIAGNOSIS — M542 Cervicalgia: Secondary | ICD-10-CM

## 2022-07-15 DIAGNOSIS — G8929 Other chronic pain: Secondary | ICD-10-CM | POA: Insufficient documentation

## 2022-07-15 DIAGNOSIS — Z6841 Body Mass Index (BMI) 40.0 and over, adult: Secondary | ICD-10-CM

## 2022-07-15 MED ORDER — PAROXETINE HCL 10 MG PO TABS: 10.0000 mg | ORAL_TABLET | Freq: Every day | ORAL | 3 refills | Status: DC

## 2022-07-15 MED ORDER — BUPROPION HCL ER (XL) 150 MG PO TB24: 150.0000 mg | ORAL_TABLET | Freq: Every day | ORAL | 1 refills | Status: DC

## 2022-07-15 NOTE — Assessment & Plan Note (Addendum)
Trulicity ineffective and causing nausea, change in stools.  Have advised patient to stop Trulicity and start Metamucil.  If stools do not normalize, she will let me know.  Calling [facility] to check with patient regarding colonoscopy is due.  She does have follow-up with [facility] gastroenterology in January 2024.  She will resume wellbutrin 150mg  for depression, appetite suppression.

## 2022-07-15 NOTE — Assessment & Plan Note (Signed)
Uncontrolled.  We agreed to restart Wellbutrin 150 mg and after a few weeks advised patient to then resume Paxil 10 mg.  It may be that we have to increase one of the other at follow-up.  She will let me know how she is

## 2022-07-15 NOTE — Patient Instructions (Addendum)
Stop trulicity   To bulk stool, you may try Metamucil 3.4 g daily up to 3 times daily as needed.   Paxil is for anxiety and you can take with wellbutrin but I would start wellbutrin '150mg'$  first and take for 4 weeks and then start paxil '10mg'$ . We can even increase paxil to '20mg'$  after several weeks

## 2022-07-15 NOTE — Progress Notes (Unsigned)
Subjective:    Patient ID: Jacqueline Delgado, female    DOB: [birth year], 71 y.o.   MRN: [id number]  CC: Jacqueline Delgado is a 71 y.o. female who presents today for follow up.   HPI: Complains of neck pain and left arm for 2 months, worsened last week.  Endorses neck stiffness.  Temporary relief after toradol. IM at urgent care last week.    Pain with lifting left arm and washing her back.   No injury. She has been lifting 5lb weights a couple of times but does not think this caused an injury Right handed. She is not taking mobic due to h/o gerd.   No numbness, fever.  No improvement with tramadol,robaxin, or prednisone.   Nausea has resolved.     She reports Arvella Nigh PA with [facility] had given steriod in left shoulder and had xray of left shoulder and xray cervical spine. ( Unable to see these notes in Epic).    Tramadol made her nauseated. She was vomiting at urgent care, since resolved.  07/06/22 seen at [facility] for suspected trapezius strain.  Given Zanaflex, Phenergan and prednisone. She had IM toradol and phenergan.   MRI  lumbar mild multilevel degenerative changes seen on 10/10/22  12/10/2017 dr Vira Agar, polyp. She has followed up with Dr Alice Reichert 10/22/2022  She follows with pain management, Dr. Dossie Arbour for lumbosacral facet syndrome  She has noted bowels have changed and she has noted after cholestectomy , she would have soft brown BM every 3-4 days. Now she noticed she is empyting stool completely. No blood in stool.   Compliant with trulicity.   She stopped Paxil when she felt so nauseous.  She was not sure if it was helping.  Hot flashes have improved.  She does feel more depression lately is unable to find work.  She endorses overeating.  She would like to retrial Wellbutrin.  No h/o seizure, anorexia, bulimia.  No history of alcohol use   HISTORY:  Past Medical History:  Diagnosis Date   Anemia    H/O   Anginal pain ([place])     3/8-12/21/10   Arthritis     Osteoarthritis in BLE knee   Colon polyp    GERD (gastroesophageal reflux disease)    Hiatal hernia    History of hiatal hernia    History of methicillin resistant staphylococcus aureus (MRSA)    Hypertension    controlled   Obesity    Pre-diabetes    Reflux    Sleep apnea    NO CPAP-SLEEP STUDY FROM 2021 WAS NEGATIVE PER PT   Small vessel disease ([place])    Past Surgical History:  Procedure Laterality Date   ABDOMINAL HYSTERECTOMY     partial; per patient has left ovary   CHOLECYSTECTOMY     COLONOSCOPY W/ POLYPECTOMY     adenomatous colon polyp   COLONOSCOPY WITH PROPOFOL N/A 12/10/2017   Procedure: COLONOSCOPY WITH PROPOFOL;  Surgeon: Manya Silvas, MD;  Location: [facility] ENDOSCOPY;  Service: Endoscopy;  Laterality: N/A;   ESOPHAGOGASTRODUODENOSCOPY (EGD) WITH PROPOFOL N/A 10/11/2015   Procedure: ESOPHAGOGASTRODUODENOSCOPY (EGD) WITH PROPOFOL;  Surgeon: Manya Silvas, MD;  Location: [facility] ENDOSCOPY;  Service: Endoscopy;  Laterality: N/A;   ESOPHAGOGASTRODUODENOSCOPY (EGD) WITH PROPOFOL N/A 12/10/2017   Procedure: ESOPHAGOGASTRODUODENOSCOPY (EGD) WITH PROPOFOL;  Surgeon: Manya Silvas, MD;  Location: [facility] ENDOSCOPY;  Service: Endoscopy;  Laterality: N/A;   ESOPHAGOGASTRODUODENOSCOPY (EGD) WITH PROPOFOL N/A 05/03/2022   Procedure: ESOPHAGOGASTRODUODENOSCOPY (EGD) WITH PROPOFOL;  Surgeon:  [name], MD;  Location: [facility] ENDOSCOPY;  Service: Endoscopy;  Laterality: N/A;   JOINT REPLACEMENT Right    knee, Oct 2012   JOINT REPLACEMENT     hopefully getting left partial knee replacement in July 2016   JOINT REPLACEMENT Left    03/06/21   SAVORY DILATION N/A 10/11/2015   Procedure: SAVORY DILATION;  Surgeon: Manya Silvas, MD;  Location: [facility] ENDOSCOPY;  Service: Endoscopy;  Laterality: N/A;   SHOULDER ARTHROSCOPY WITH ROTATOR CUFF REPAIR AND SUBACROMIAL DECOMPRESSION Right 06/14/2015   Procedure: SHOULDER ARTHROSCOPY WITH mini open ROTATOR CUFF REPAIR AND  SUBACROMIAL DECOMPRESSION, release long head biceps tendon.;  Surgeon: Leanor Kail, MD;  Location: [facility] ORS;  Service: Orthopedics;  Laterality: Right;   TOTAL KNEE ARTHROPLASTY Left 03/06/2021   Procedure: TOTAL KNEE ARTHROPLASTY - Rachelle Hora to Assist;  Surgeon: Hessie Knows, MD;  Location: [facility] ORS;  Service: Orthopedics;  Laterality: Left;   Family History  Problem Relation Age of Onset   Breast cancer Mother 79   Hyperlipidemia Father    Heart disease Father    Stroke Father    Hypertension Father    Breast cancer Maternal Grandmother        young   Alcoholism Other        brother   Arthritis Other        parent   Diabetes Other        parent and other family member   Thyroid cancer Neg Hx     Allergies: Patient has no known allergies. Current Outpatient Medications on File Prior to Visit  Medication Sig Dispense Refill   hydrochlorothiazide (HYDRODIURIL) 25 MG tablet Take 1 tablet (25 mg total) by mouth daily. Please contact the office for a follow up appointment with Dr. Garen Lah. 90 tablet 0   losartan (COZAAR) 50 MG tablet TAKE 1 TABLET BY MOUTH EVERY DAY 90 tablet 1   ondansetron (ZOFRAN-ODT) 4 MG disintegrating tablet TAKE 1 TABLET BY MOUTH EVERY 8 HOURS AS NEEDED FOR NAUSEA AND VOMITING 30 tablet 1   potassium chloride (KLOR-CON M) 10 MEQ tablet Take 1 tablet (10 mEq total) by mouth daily. 120 tablet 1   RABEprazole (ACIPHEX) 20 MG tablet Take 20 mg by mouth in the morning and at bedtime.     TRULICITY 4.5 WV/3.7TG SOPN Inject into the skin.     No current facility-administered medications on file prior to visit.    Social History   Tobacco Use   Smoking status: Never   Smokeless tobacco: Never  Vaping Use   Vaping Use: Never used  Substance Use Topics   Alcohol use: No   Drug use: No    Review of Systems  Constitutional:  Negative for chills and fever.  Respiratory:  Negative for cough.   Cardiovascular:  Negative for chest pain and  palpitations.  Gastrointestinal:  Negative for nausea and vomiting.  Musculoskeletal:  Positive for arthralgias.  Neurological:  Negative for numbness.      Objective:    BP 122/82 (BP Location: Left Arm, Patient Position: Sitting, Cuff Size: Normal)   Pulse (!) 112   Temp 98.6 F (37 C) (Oral)   Ht '5\' 3"'$  (1.6 m)   Wt 253 lb 3.2 oz (114.9 kg)   SpO2 96%   BMI 44.85 kg/m  BP Readings from Last 3 Encounters:  07/15/22 122/82  06/06/22 129/71  05/23/22 (!) 132/94   Wt Readings from Last 3 Encounters:  07/15/22 253 lb 3.2 oz (114.9  kg)  06/06/22 243 lb 4.8 oz (110.4 kg)  05/23/22 243 lb (110.2 kg)    Physical Exam Vitals reviewed.  Constitutional:      Appearance: She is well-developed.  Eyes:     Conjunctiva/sclera: Conjunctivae normal.  Cardiovascular:     Rate and Rhythm: Normal rate and regular rhythm.     Pulses: Normal pulses.     Heart sounds: Normal heart sounds.  Pulmonary:     Effort: Pulmonary effort is normal.     Breath sounds: Normal breath sounds. No wheezing, rhonchi or rales.  Musculoskeletal:     Left shoulder: Bony tenderness present. No swelling. Decreased range of motion.       Arms:     Cervical back: No rigidity or bony tenderness. No pain with movement, spinous process tenderness or muscular tenderness. Normal range of motion.     Comments: Left Shoulder:  Tenderness over trapezius muscle.    No asymmetry of shoulders when comparing right and left. No pain with palpation over glenohumeral joint lines, T[place] joint, AC joint, or bicipital groove. Pain with internal and external rotation. Pain with resisted lateral extension .   Strength and sensation normal BUE's.   Skin:    General: Skin is warm and dry.  Neurological:     Mental Status: She is alert.  Psychiatric:        Speech: Speech normal.        Behavior: Behavior normal.        Thought Content: Thought content normal.        Assessment & Plan:   Problem List Items Addressed  This Visit       Other   Depression, major, recurrent, moderate (HCC)     Uncontrolled.  We agreed to restart Wellbutrin 150 mg and after a few weeks advised patient to then resume Paxil 10 mg.  It may be that we have to increase one of the other at follow-up.  She will let me know how she is      Relevant Medications   buPROPion (WELLBUTRIN XL) 150 MG 24 hr tablet   PARoxetine (PAXIL) 10 MG tablet   Left shoulder pain - Primary   Relevant Medications   buPROPion (WELLBUTRIN XL) 150 MG 24 hr tablet   Other Relevant Orders   MR Cervical Spine Wo Contrast   MR Shoulder Left Wo Contrast   Morbid obesity with BMI of 40.0-44.9, adult (HCC)    Trulicity ineffective and causing nausea, change in stools.  Have advised patient to stop Trulicity and start Metamucil.  If stools do not normalize, she will let me know.  [facility]ing [facility] to check with patient regarding colonoscopy is due.  She does have follow-up with [facility] gastroenterology in January 2024      Vasomotor symptoms due to menopause   Relevant Medications   PARoxetine (PAXIL) 10 MG tablet   Other Visit Diagnoses     Cervicalgia            I have discontinued Jacqueline Delgado's valACYclovir and meloxicam. I am also having her start on buPROPion. Additionally, I am having her maintain her potassium chloride, losartan, ondansetron, hydrochlorothiazide, Trulicity, RABEprazole, and PARoxetine.   Meds ordered this encounter  Medications   buPROPion (WELLBUTRIN XL) 150 MG 24 hr tablet    Sig: Take 1 tablet (150 mg total) by mouth daily.    Dispense:  90 tablet    Refill:  1    Order Specific Question:  Supervising Provider    Answer:   Crecencio Mc [[id number]]   PARoxetine (PAXIL) 10 MG tablet    Sig: Take 1 tablet (10 mg total) by mouth daily.    Dispense:  90 tablet    Refill:  3    Order Specific Question:   Supervising Provider    Answer:   Crecencio Mc [[id number]]    Return precautions given.   Risks, benefits,  and alternatives of the medications and treatment plan prescribed today were discussed, and patient expressed understanding.   Education regarding symptom management and diagnosis given to patient on AVS.  Continue to follow with Burnard Hawthorne, FNP for routine health maintenance.   [name] and I agreed with plan.   Mable Paris, FNP

## 2022-07-15 NOTE — Telephone Encounter (Signed)
Call [facility] and see when pt colonoscopy is due  Please inform pt as well

## 2022-07-15 NOTE — Telephone Encounter (Signed)
I called pt to sch her MRI's pt states she is claustrophobic and would like something called into her pharmacy to relax her. Please advise and Thank you!  Call pt @ [phone]  [facility]  [id number] [place]  [phone] Fax:  [phone]

## 2022-07-16 LAB — IBC + FERRITIN
Ferritin: 23.4 ng/mL (ref 10.0–291.0)
Iron: 33 ug/dL — ABNORMAL LOW (ref 42–145)
Saturation Ratios: 9.7 % — ABNORMAL LOW (ref 20.0–50.0)
TIBC: 340.2 ug/dL (ref 250.0–450.0)
Transferrin: 243 mg/dL (ref 212.0–360.0)

## 2022-07-16 LAB — B12 AND FOLATE PANEL
Folate: 16.9 ng/mL (ref >5.9)
Vitamin B-12: 272 pg/mL (ref 211–911)

## 2022-07-16 LAB — HEMOGLOBIN A1C: Hgb A1c MFr Bld: 5.8 % (ref 4.6–6.5)

## 2022-07-16 NOTE — Telephone Encounter (Signed)
Patient is scheduled for 07/25/22 for MRI @ 4pm at [facility] pt is aware of appt

## 2022-07-16 NOTE — Telephone Encounter (Addendum)
Called [facility] and pt has appt for Jan 9th 2024? And her Colonoscopy is due in Feb 2024. Patient is aware

## 2022-07-17 ENCOUNTER — Other Ambulatory Visit: Payer: Self-pay | Admitting: Family

## 2022-07-17 DIAGNOSIS — E538 Deficiency of other specified B group vitamins: Secondary | ICD-10-CM

## 2022-07-17 NOTE — Assessment & Plan Note (Signed)
Unable to see xray from [facility]. Pending MRI cervical spine, shoulder as concerned for rotator cuff pathology. She will have image burned to CD to share with orthopedic.

## 2022-07-19 ENCOUNTER — Ambulatory Visit (INDEPENDENT_AMBULATORY_CARE_PROVIDER_SITE_OTHER): Payer: Medicare Other

## 2022-07-19 ENCOUNTER — Encounter (INDEPENDENT_AMBULATORY_CARE_PROVIDER_SITE_OTHER): Payer: Medicare Other

## 2022-07-19 DIAGNOSIS — E538 Deficiency of other specified B group vitamins: Secondary | ICD-10-CM

## 2022-07-19 MED ORDER — CYANOCOBALAMIN 1000 MCG/ML IJ SOLN
1000.0000 ug | Freq: Once | INTRAMUSCULAR | Status: AC
  Administered 2022-07-19: 1000 ug via INTRAMUSCULAR

## 2022-07-19 NOTE — Progress Notes (Addendum)
Patient arrived for B12 injection given B12 injection in right arm Patient tolerated injection well Patient did not show any signs of distress or voice any concerns today

## 2022-07-19 NOTE — Progress Notes (Signed)
Patient arrived for vitamin B12 injection Patient given B12 injection in right deltoid Patient tolerated injection well Patient did not show any signs of distress or voice any concerns today

## 2022-07-22 ENCOUNTER — Ambulatory Visit
Admission: RE | Admit: 2022-07-22 | Discharge: 2022-07-22 | Disposition: A | Discharge status: Home / Self Care | Payer: Medicare Other | Source: Ambulatory Visit | Attending: Family | Admitting: Family

## 2022-07-22 DIAGNOSIS — M85852 Other specified disorders of bone density and structure, left thigh: Secondary | ICD-10-CM | POA: Insufficient documentation

## 2022-07-22 DIAGNOSIS — Z13820 Encounter for screening for osteoporosis: Secondary | ICD-10-CM | POA: Insufficient documentation

## 2022-07-22 DIAGNOSIS — M159 Polyosteoarthritis, unspecified: Secondary | ICD-10-CM

## 2022-07-22 DIAGNOSIS — Z1231 Encounter for screening mammogram for malignant neoplasm of breast: Secondary | ICD-10-CM | POA: Diagnosis not present

## 2022-07-25 ENCOUNTER — Ambulatory Visit
Admission: RE | Admit: 2022-07-25 | Discharge: 2022-07-25 | Disposition: A | Discharge status: Home / Self Care | Payer: Medicare Other | Source: Ambulatory Visit | Attending: Family | Admitting: Family

## 2022-07-25 ENCOUNTER — Telehealth: Payer: Self-pay

## 2022-07-25 DIAGNOSIS — M25512 Pain in left shoulder: Secondary | ICD-10-CM

## 2022-07-25 DIAGNOSIS — M4802 Spinal stenosis, cervical region: Secondary | ICD-10-CM | POA: Diagnosis not present

## 2022-07-25 DIAGNOSIS — M47812 Spondylosis without myelopathy or radiculopathy, cervical region: Secondary | ICD-10-CM | POA: Diagnosis not present

## 2022-07-25 DIAGNOSIS — M25412 Effusion, left shoulder: Secondary | ICD-10-CM | POA: Diagnosis not present

## 2022-07-25 DIAGNOSIS — M19012 Primary osteoarthritis, left shoulder: Secondary | ICD-10-CM | POA: Diagnosis not present

## 2022-07-25 DIAGNOSIS — R600 Localized edema: Secondary | ICD-10-CM | POA: Diagnosis not present

## 2022-07-25 NOTE — Telephone Encounter (Signed)
Patient states she has MRIs scheduled for this afternoon and she has to be there at 2:45pm.  Patient states Mable Paris, FNP, was going to call in something for patient to take before having her MRI since she is claustrophobic.  Please call when this has been sent to pharmacy.  *[facility] states [facility] [address].

## 2022-07-26 ENCOUNTER — Ambulatory Visit (INDEPENDENT_AMBULATORY_CARE_PROVIDER_SITE_OTHER): Payer: Medicare Other

## 2022-07-26 DIAGNOSIS — E538 Deficiency of other specified B group vitamins: Secondary | ICD-10-CM | POA: Diagnosis not present

## 2022-07-26 MED ORDER — CYANOCOBALAMIN 1000 MCG/ML IJ SOLN
1000.0000 ug | Freq: Once | INTRAMUSCULAR | Status: AC
  Administered 2022-07-26: 1000 ug via INTRAMUSCULAR

## 2022-07-26 NOTE — Progress Notes (Addendum)
Patient arrived for B12 injection. Given B12 injection in Left arm. Patient tolerated injection well. Patient showed no signs of distress or voiced any concerns.

## 2022-07-31 ENCOUNTER — Other Ambulatory Visit (INDEPENDENT_AMBULATORY_CARE_PROVIDER_SITE_OTHER): Payer: Medicare Other

## 2022-07-31 ENCOUNTER — Other Ambulatory Visit: Payer: Medicare Other

## 2022-07-31 DIAGNOSIS — E538 Deficiency of other specified B group vitamins: Secondary | ICD-10-CM | POA: Diagnosis not present

## 2022-08-01 LAB — B12 AND FOLATE PANEL
Folate: 14.1 ng/mL (ref >5.9)
Vitamin B-12: 690 pg/mL (ref 211–911)

## 2022-08-02 ENCOUNTER — Ambulatory Visit (INDEPENDENT_AMBULATORY_CARE_PROVIDER_SITE_OTHER): Payer: Medicare Other

## 2022-08-02 DIAGNOSIS — E538 Deficiency of other specified B group vitamins: Secondary | ICD-10-CM | POA: Diagnosis not present

## 2022-08-02 MED ORDER — CYANOCOBALAMIN 1000 MCG/ML IJ SOLN
1000.0000 ug | Freq: Once | INTRAMUSCULAR | Status: AC
  Administered 2022-08-02: 1000 ug via INTRAMUSCULAR

## 2022-08-02 NOTE — Progress Notes (Signed)
Patient arrived for B12 injection. Given in Right arm. Patient tolerated B12 injection well. Patient did not show any signs of distress or voice any concerns.

## 2022-08-03 LAB — CELIAC DISEASE AB SCREEN W/RFX
Antigliadin Abs, IgA: 9 units (ref 0–19)
IgA/Immunoglobulin A, Serum: 386 mg/dL — ABNORMAL HIGH (ref 87–352)
Transglutaminase IgA: <2 U/mL (ref 0–3)

## 2022-08-04 LAB — HOMOCYSTEINE: Homocysteine: 12.7 umol/L — ABNORMAL HIGH (ref <10.4)

## 2022-08-04 LAB — INTRINSIC FACTOR ANTIBODIES: Intrinsic Factor: NEGATIVE

## 2022-08-09 ENCOUNTER — Ambulatory Visit (INDEPENDENT_AMBULATORY_CARE_PROVIDER_SITE_OTHER): Payer: Medicare Other

## 2022-08-09 ENCOUNTER — Other Ambulatory Visit: Payer: Self-pay | Admitting: Family

## 2022-08-09 DIAGNOSIS — I10 Essential (primary) hypertension: Secondary | ICD-10-CM

## 2022-08-09 DIAGNOSIS — E538 Deficiency of other specified B group vitamins: Secondary | ICD-10-CM | POA: Diagnosis not present

## 2022-08-09 MED ORDER — CYANOCOBALAMIN 1000 MCG/ML IJ SOLN
1000.0000 ug | Freq: Once | INTRAMUSCULAR | Status: AC
  Administered 2022-08-09: 1000 ug via INTRAMUSCULAR

## 2022-08-09 NOTE — Progress Notes (Signed)
Pt arrived for B12 injection, given in L deltoid. Pt tolerated injection well, showed no signs of distress nor voiced any concerns.  ?

## 2022-08-14 ENCOUNTER — Other Ambulatory Visit: Payer: Self-pay | Admitting: Family

## 2022-08-14 DIAGNOSIS — E538 Deficiency of other specified B group vitamins: Secondary | ICD-10-CM

## 2022-08-19 ENCOUNTER — Other Ambulatory Visit: Payer: Self-pay | Admitting: Family

## 2022-09-13 ENCOUNTER — Emergency Department: Payer: Medicare Other

## 2022-09-13 ENCOUNTER — Emergency Department
Admission: EM | Admit: 2022-09-13 | Discharge: 2022-09-13 | Disposition: A | Discharge status: Home / Self Care | Payer: Medicare Other | Attending: Emergency Medicine | Admitting: Emergency Medicine

## 2022-09-13 ENCOUNTER — Encounter: Payer: Self-pay | Admitting: Emergency Medicine

## 2022-09-13 ENCOUNTER — Other Ambulatory Visit: Payer: Self-pay

## 2022-09-13 DIAGNOSIS — Z8616 Personal history of COVID-19: Secondary | ICD-10-CM | POA: Diagnosis not present

## 2022-09-13 DIAGNOSIS — I10 Essential (primary) hypertension: Secondary | ICD-10-CM | POA: Insufficient documentation

## 2022-09-13 DIAGNOSIS — R519 Headache, unspecified: Secondary | ICD-10-CM | POA: Insufficient documentation

## 2022-09-13 DIAGNOSIS — Z743 Need for continuous supervision: Secondary | ICD-10-CM | POA: Diagnosis not present

## 2022-09-13 DIAGNOSIS — R079 Chest pain, unspecified: Secondary | ICD-10-CM | POA: Insufficient documentation

## 2022-09-13 DIAGNOSIS — R0789 Other chest pain: Secondary | ICD-10-CM | POA: Diagnosis not present

## 2022-09-13 DIAGNOSIS — M25512 Pain in left shoulder: Secondary | ICD-10-CM | POA: Insufficient documentation

## 2022-09-13 DIAGNOSIS — M546 Pain in thoracic spine: Secondary | ICD-10-CM | POA: Insufficient documentation

## 2022-09-13 DIAGNOSIS — M549 Dorsalgia, unspecified: Secondary | ICD-10-CM

## 2022-09-13 DIAGNOSIS — M545 Low back pain, unspecified: Secondary | ICD-10-CM | POA: Diagnosis not present

## 2022-09-13 LAB — TROPONIN I (HIGH SENSITIVITY)
Troponin I (High Sensitivity): 3 ng/L (ref <18)
Troponin I (High Sensitivity): 3 ng/L (ref <18)

## 2022-09-13 LAB — CBC
HCT: 34.5 % — ABNORMAL LOW (ref 36.0–46.0)
Hemoglobin: 10.7 g/dL — ABNORMAL LOW (ref 12.0–15.0)
MCH: 26.7 pg (ref 26.0–34.0)
MCHC: 31 g/dL (ref 30.0–36.0)
MCV: 86 fL (ref 80.0–100.0)
Platelets: 212 10*3/uL (ref 150–400)
RBC: 4.01 MIL/uL (ref 3.87–5.11)
RDW: 14 % (ref 11.5–15.5)
WBC: 7 10*3/uL (ref 4.0–10.5)
nRBC: 0 % (ref 0.0–0.2)

## 2022-09-13 LAB — BASIC METABOLIC PANEL
Anion gap: 8 (ref 5–15)
BUN: 16 mg/dL (ref 8–23)
CO2: 27 mmol/L (ref 22–32)
Calcium: 9.5 mg/dL (ref 8.9–10.3)
Chloride: 108 mmol/L (ref 98–111)
Creatinine, Ser: 1.07 mg/dL — ABNORMAL HIGH (ref 0.44–1.00)
GFR, Estimated: 56 mL/min — ABNORMAL LOW (ref >60)
Glucose, Bld: 99 mg/dL (ref 70–99)
Potassium: 3.5 mmol/L (ref 3.5–5.1)
Sodium: 143 mmol/L (ref 135–145)

## 2022-09-13 MED ORDER — LIDOCAINE 5 % EX PTCH
1.0000 | MEDICATED_PATCH | CUTANEOUS | Status: DC
  Administered 2022-09-13: 1 via TRANSDERMAL
  Filled 2022-09-13: qty 1

## 2022-09-13 MED ORDER — LIDOCAINE 5 % EX PTCH: 1.0000 | MEDICATED_PATCH | CUTANEOUS | 0 refills | Status: DC

## 2022-09-13 MED ORDER — ACETAMINOPHEN 500 MG PO TABS
1000.0000 mg | ORAL_TABLET | Freq: Once | ORAL | Status: AC
  Administered 2022-09-13: 1000 mg via ORAL
  Filled 2022-09-13: qty 2

## 2022-09-13 NOTE — Discharge Instructions (Signed)
Your cardiac workup including your EKG chest x-ray and blood work were all reassuring.  I suspect that your pain is related to a musculoskeletal cause.  You can take Tylenol and use a Lidoderm patch for pain.  If you develop shortness of breath worsening pain or coughing up blood or developing fevers please return to the emergency department.

## 2022-09-13 NOTE — ED Notes (Signed)
Pt sts placement of IV was painful. IV removed per pt's request.

## 2022-09-13 NOTE — ED Provider Notes (Signed)
[facility] Provider Note    Event Date/Time   First MD Initiated Contact with Patient 09/13/22 [phone]     (approximate)   History   Chest Pain   HPI  Jacqueline Delgado is a 71 y.o. female past known history of arthritis hiatal hernia GERD hypertension who presents with left upper back pain shoulder pain and chest pain.  Patient started with some bilateral neck pain several days ago.  Has been using heating pad with some relief.  Denies any preceding injury.  Pain is located in the bilateral upper neck causing mild headache.  Denies numbness tingling weakness.  Denies visual change.  Pain is now radiating to the left upper back and around to the left breast.  Denies shortness of breath nausea or diaphoresis.  Pain is nonexertional nonpleuritic feels worse with movement.     Past Medical History:  Diagnosis Date   Anemia    H/O   Anginal pain ([place])     3/8-12/21/10   Arthritis    Osteoarthritis in BLE knee   Colon polyp    GERD (gastroesophageal reflux disease)    Hiatal hernia    History of hiatal hernia    History of methicillin resistant staphylococcus aureus (MRSA)    Hypertension    controlled   Obesity    Pre-diabetes    Reflux    Sleep apnea    NO CPAP-SLEEP STUDY FROM 2021 WAS NEGATIVE PER PT   Small vessel disease ([place])     Patient Active Problem List   Diagnosis Date Noted   Left shoulder pain 07/15/2022   Vasomotor symptoms due to menopause 06/06/2022   Acute midline low back pain without sciatica 04/22/2022   Epigastric pain 11/26/2021   Prediabetes 09/19/2021   Osteoarthritis of lumbar spine 09/13/2021   Osteoarthritis involving multiple joints 09/13/2021   Sterile pyuria 08/06/2021   Chronic knee pain after total replacement of knee joint (Left) 08/01/2021   Trouble in sleeping 06/20/2021   Urinary frequency 05/23/2021   S/P TKR (total knee replacement) using cement, left 03/06/2021   Nasal congestion 02/21/2021   OAB  (overactive bladder) 38/18/2993   Uncomplicated opioid dependence ([place]) 10/04/2020   Chronic low back pain (Bilateral) (R>L) w/o sciatica 09/18/2020   Spondylosis without myelopathy or radiculopathy, lumbosacral region 09/18/2020   Lumbosacral facet syndrome (Bilateral) (R>L) 08/23/2020   Loose stools 08/21/2020   Morbid obesity with BMI of 40.0-44.9, adult ([place]) 08/21/2020   Hypokalemia 07/18/2020   Hypocalcemia 07/18/2020   Hypoalbuminemia 07/18/2020   Chronic lower extremity pain (2ry area of Pain) (Bilateral) (R>L) 05/23/2020   Morbid obesity ([place]) 05/23/2020   Elevated C-reactive protein (CRP) 05/23/2020   Elevated sed rate 05/23/2020   Calcaneal spur of foot (Right) 05/23/2020   Lumbar Grade 1 Anterolisthesis of L4/L5 05/23/2020   Chronic pain syndrome 05/22/2020   Pharmacologic therapy 05/22/2020   Disorder of skeletal system 05/22/2020   Problems influencing health status 05/22/2020   DDD (degenerative disc disease), lumbosacral 05/22/2020   Lumbar facet hypertrophy 05/22/2020   Abnormal MRI, lumbar spine (04/12/2013) 05/22/2020   Lumbosacral central spinal stenosis (9 mm) (L4-5 and L5-S1) 05/22/2020   Chronic lower extremity pain (Right) 05/22/2020   Lumbosacral radiculopathy at L5 (Right) 05/22/2020   Chronic knee pain (3ry area of Pain) (Left) 05/22/2020   Swelling of foot (4th area of Pain) (Right) 05/22/2020   COVID-19 04/09/2020   Ankle swelling, right 04/03/2020   Hiatal hernia 02/18/2020   Small vessel vasculitis (  [place]) 02/18/2020   Non-cardiac chest pain 02/18/2020   Vaginal discharge 10/01/2019   SOB (shortness of breath) on exertion 09/23/2019   Anemia 09/23/2019   B12 deficiency 09/23/2019   Vitamin D deficiency 09/23/2019   Hematuria 05/14/2019   Hot flashes 05/14/2019   Screening for breast cancer 05/14/2019   Skin fissures 04/22/2019   Fatigue 12/09/2018   Localized swelling of finger of left hand 11/18/2018   Cough 11/11/2018   Genital herpes simplex  11/11/2018   Essential hypertension 11/11/2018   Plantar fasciitis 05/03/2016   OSA (obstructive sleep apnea) 04/23/2016   Acquired trigger finger 11/23/2015   Depression, major, recurrent, moderate ([place]) 07/06/2015   Closed nondisplaced fracture of base of fifth metacarpal bone of right hand 06/22/2015   S/P rotator cuff repair 06/22/2015   Complete tear of right rotator cuff 05/23/2015   Osteoarthritis of knee (Left) 02/22/2015   DDD (degenerative disc disease), cervical 08/03/2014   DDD (degenerative disc disease), lumbar 08/03/2014   Leukocytoclastic vasculitis ([place]) 08/03/2014   Neck pain 08/03/2014   Cerebral vascular malformation 08/02/2014   Unspecified osteoarthritis, unspecified site 07/05/2014   GERD (gastroesophageal reflux disease) 09/20/2013   Chronic low back pain (1ry area of Pain) (Bilateral) (R>L) w/ sciatica (Right) 09/20/2013   Osteoarthritis, knee 09/20/2013   Essential (primary) hypertension 09/20/2013   Adenomatous colon polyp 03/21/2006     Physical Exam  Triage Vital Signs: ED Triage Vitals  Enc Vitals Group     BP 09/13/22 0146 (!) 145/85     Pulse Rate 09/13/22 0146 100     Resp 09/13/22 0146 18     Temp 09/13/22 0146 98.9 F (37.2 C)     Temp Source 09/13/22 0146 Oral     SpO2 09/13/22 0137 98 %     Weight 09/13/22 0144 253 lb (114.8 kg)     Height 09/13/22 0144 '5\' 3"'$  (1.6 m)     Head Circumference --      Peak Flow --      Pain Score 09/13/22 0144 10     Pain Loc --      Pain Edu? --      Excl. in [place]? --     Most recent vital signs: Vitals:   09/13/22 0146 09/13/22 0516  BP: (!) 145/85 115/69  Pulse: 100 82  Resp: 18 15  Temp: 98.9 F (37.2 C)   SpO2: 95% 97%     General: Awake, no distress.  CV:  Good peripheral perfusion.  Resp:  Normal effort.  Abd:  No distention.  Neuro:             Awake, Alert, Oriented x 3  Other:  Tenderness to palpation along the paraspinal cervical musculature on both right and left sides but worse  on the left, tenderness to palpation of the left trap There is some erythema in the mid scapular region and the left back no vesicular changes No significant tenderness to palpation along the chest wall, intertrigo or under the left breast   ED Results / Procedures / Treatments  Labs (all labs ordered are listed, but only abnormal results are displayed) Labs Reviewed  CBC - Abnormal; Notable for the following components:      Result Value   Hemoglobin 10.7 (*)    HCT 34.5 (*)    All other components within normal limits  BASIC METABOLIC PANEL - Abnormal; Notable for the following components:   Creatinine, Ser 1.07 (*)    GFR, Estimated 56 (*)  All other components within normal limits  TROPONIN I (HIGH SENSITIVITY)  TROPONIN I (HIGH SENSITIVITY)     EKG  EKG interpretation performed by myself: NSR, nml axis, nml intervals, no acute ischemic changes    RADIOLOGY I reviewed and interpreted the CXR which does not show any acute cardiopulmonary process    PROCEDURES:  Critical Care performed: No  .1-3 Lead EKG Interpretation  Performed by: Rada Hay, MD Authorized by: Rada Hay, MD     Interpretation: normal     ECG rate assessment: normal     Rhythm: sinus rhythm     Ectopy: none     Conduction: normal     The patient is on the cardiac monitor to evaluate for evidence of arrhythmia and/or significant heart rate changes.   MEDICATIONS ORDERED IN ED: Medications  lidocaine (LIDODERM) 5 % 1 patch (1 patch Transdermal Patch Applied 09/13/22 0519)  acetaminophen (TYLENOL) tablet 1,000 mg (1,000 mg Oral Given 09/13/22 0518)     IMPRESSION / MDM / ASSESSMENT AND PLAN / ED COURSE  I reviewed the triage vital signs and the nursing notes.                              Patient's presentation is most consistent with acute presentation with potential threat to life or bodily function.  Differential diagnosis includes, but is not limited to, muscle  strain, cervical radiculopathy, referred pain from shoulder, less likely ACS pulmonary embolism, dissection   Patient is a 71 year old female presenting with neck upper back and chest pain.  Symptoms been going on for several days.  Started with neck pain since radiating to the left upper back starting to radiate to the left chest last night.  She is not having shortness of breath nausea or diaphoresis.  Pain is clearly worse with movement.  On exam overall she looks well she does have tenderness along the left trap and paraspinal musculature in the cervical spine as well as left upper back and left chest.  Interestingly she does have some erythematous rash on the left scapula but does not vesicular does not look like zoster.  She also has some intertrigo under the only the left breast.  Chest x-ray is clear EKG reassuring troponin x 2 negative.  Low suspicion for ACS based on symptomatology.  Did consider pulmonary embolism given the location of the pain but it is not pleuritic she is not short of breath and has no risk factors with normal vital signs feels is less likely.  I think is primarily musculoskeletal.  Patient given Tylenol and Lidoderm patch.  Discussed pain control and return precautions.  She is appropriate for discharge.     FINAL CLINICAL IMPRESSION(S) / ED DIAGNOSES   Final diagnoses:  Left-sided back pain, unspecified back location, unspecified chronicity  Chest pain, unspecified type     Rx / DC Orders   ED Discharge Orders          Ordered    lidocaine (LIDODERM) 5 %  Every 24 hours        09/13/22 0538             Note:  This document was prepared using Dragon voice recognition software and may include unintentional dictation errors.   Rada Hay, MD 09/13/22 [phone]

## 2022-09-13 NOTE — ED Triage Notes (Addendum)
EMS brings pt in from home for c/o CP; to triage via w/c with no distress noted; st on Saturday noted neck pain which progressed to upper back pain; now having left sided CP radiating into left arm with no accomp symptoms; st pain increases with movement

## 2022-09-13 NOTE — ED Notes (Signed)
E-signature pad unavailable - Pt verbalized understanding of D/C information - no additional concerns at this time.  

## 2022-09-17 ENCOUNTER — Telehealth: Payer: Self-pay

## 2022-09-17 NOTE — Telephone Encounter (Signed)
Transition Care Management Follow-up Telephone Call Date of discharge and from where: 09/13/22 [facility] ED How have you been since you were released from the hospital? Pain has improved but Left arm and shoulder still experiencing pain.  Any questions or concerns? No  Items Reviewed: Did the pt receive and understand the discharge instructions provided? Yes  Medications obtained and verified? Yes  Pt has picked up Liocaine patches and is using them.  Other?  NA Any new allergies since your discharge? No  Dietary orders reviewed? Yes Do you have support at home? Yes   Home Care and Equipment/Supplies: Were home health services ordered? not applicable If so, what is the name of the agency? NA  Has the agency set up a time to come to the patient's home? not applicable Were any new equipment or medical supplies ordered?  NA What is the name of the medical supply agency? NA Were you able to get the supplies/equipment? not applicable Do you have any questions related to the use of the equipment or supplies? NA  Functional Questionnaire: (I = Independent and D = Dependent) ADLs: I  Bathing/Dressing- I  Meal Prep- I  Eating- I  Maintaining continence- I  Transferring/Ambulation- I  Managing Meds- I  Follow up appointments reviewed:  PCP Hospital f/u appt confirmed? Yes  Scheduled to see Mable Paris on 09/25/22 @ 2:15 p. [facility] f/u appt confirmed?  NA   Are transportation arrangements needed? No  If their condition worsens, is the pt aware to call PCP or go to the Emergency Dept.? Yes Was the patient provided with contact information for the PCP's office or ED? Yes Was to pt encouraged to call back with questions or concerns? Yes

## 2022-09-25 ENCOUNTER — Ambulatory Visit: Payer: Medicare Other | Admitting: Family

## 2022-09-25 NOTE — Progress Notes (Signed)
This encounter was created in error - please disregard.

## 2022-10-06 ENCOUNTER — Other Ambulatory Visit: Payer: Self-pay | Admitting: Family

## 2022-10-06 DIAGNOSIS — G479 Sleep disorder, unspecified: Secondary | ICD-10-CM

## 2022-10-20 ENCOUNTER — Other Ambulatory Visit: Payer: Self-pay | Admitting: Family

## 2022-10-21 ENCOUNTER — Other Ambulatory Visit: Payer: Self-pay | Admitting: Family

## 2022-10-21 ENCOUNTER — Ambulatory Visit (INDEPENDENT_AMBULATORY_CARE_PROVIDER_SITE_OTHER): Payer: Medicare Other | Admitting: Family

## 2022-10-21 ENCOUNTER — Encounter: Payer: Self-pay | Admitting: Family

## 2022-10-21 DIAGNOSIS — Z6841 Body Mass Index (BMI) 40.0 and over, adult: Secondary | ICD-10-CM

## 2022-10-21 DIAGNOSIS — M25512 Pain in left shoulder: Secondary | ICD-10-CM

## 2022-10-21 DIAGNOSIS — Z8639 Personal history of other endocrine, nutritional and metabolic disease: Secondary | ICD-10-CM

## 2022-10-21 DIAGNOSIS — D649 Anemia, unspecified: Secondary | ICD-10-CM | POA: Diagnosis not present

## 2022-10-21 DIAGNOSIS — G479 Sleep disorder, unspecified: Secondary | ICD-10-CM

## 2022-10-21 LAB — CBC WITH DIFFERENTIAL/PLATELET
Basophils Absolute: 0 10*3/uL (ref 0.0–0.1)
Basophils Relative: 0.8 % (ref 0.0–3.0)
Eosinophils Absolute: 0.2 10*3/uL (ref 0.0–0.7)
Eosinophils Relative: 3.1 % (ref 0.0–5.0)
HCT: 36.3 % (ref 36.0–46.0)
Hemoglobin: 11.8 g/dL — ABNORMAL LOW (ref 12.0–15.0)
Lymphocytes Relative: 41.4 % (ref 12.0–46.0)
Lymphs Abs: 2 10*3/uL (ref 0.7–4.0)
MCHC: 32.4 g/dL (ref 30.0–36.0)
MCV: 82.6 fl (ref 78.0–100.0)
Monocytes Absolute: 0.4 10*3/uL (ref 0.1–1.0)
Monocytes Relative: 9.2 % (ref 3.0–12.0)
Neutro Abs: 2.2 10*3/uL (ref 1.4–7.7)
Neutrophils Relative %: 45.5 % (ref 43.0–77.0)
Platelets: 245 10*3/uL (ref 150.0–400.0)
RBC: 4.39 Mil/uL (ref 3.87–5.11)
RDW: 15.4 % (ref 11.5–15.5)
WBC: 4.8 10*3/uL (ref 4.0–10.5)

## 2022-10-21 LAB — IBC + FERRITIN
Ferritin: 13.4 ng/mL (ref 10.0–291.0)
Iron: 46 ug/dL (ref 42–145)
Saturation Ratios: 11 % — ABNORMAL LOW (ref 20.0–50.0)
TIBC: 417.2 ug/dL (ref 250.0–450.0)
Transferrin: 298 mg/dL (ref 212.0–360.0)

## 2022-10-21 LAB — B12 AND FOLATE PANEL
Folate: 16.3 ng/mL (ref >5.9)
Vitamin B-12: 343 pg/mL (ref 211–911)

## 2022-10-21 LAB — VITAMIN D 25 HYDROXY (VIT D DEFICIENCY, FRACTURES): VITD: 16.47 ng/mL — ABNORMAL LOW (ref 30.00–100.00)

## 2022-10-21 LAB — HEMOGLOBIN A1C: Hgb A1c MFr Bld: 5.5 % (ref 4.6–6.5)

## 2022-10-21 MED ORDER — ZEPBOUND 2.5 MG/0.5ML ~~LOC~~ SOAJ: 2.5000 mg | SUBCUTANEOUS | 1 refills | Status: DC

## 2022-10-21 NOTE — Patient Instructions (Addendum)
Please call Dr Lowella Dandy  to schedule follow up  Increase trazodone to '100mg'$    As discussed, let's start by scheduling Tylenol Arthritis which is a '650mg'$  tablet .   You may take 1-2 tablets every 8 hours ( scheduled) with maximum of 6 tablets per day.   For example , you could take two tablets in the morning ( 8am) and then two tablets again at 4pm.   Maximum daily dose of acetaminophen 4 g per day from all sources.  If you are taking another medication which includes acetaminophen (Tylenol) which may be in cough and cold preparations or pain medication such as Percocet, you will need to factor that into your total daily dose to be safe.  Please let me know if any questions  Limit anti inflammatories as can worsen reflux.   For post menopausal women, guidelines recommend a diet with 1200 mg of Calcium per day. If you are eating calcium rich foods, you do not need a calcium supplement. The body better absorbs the calcium that you eat over supplementation. If you do supplement, I recommend not supplementing the full 1200 mg/ day as this can lead to increased risk of cardiovascular disease. I recommend Calcium Citrate over the counter, and you may take a total of 600 to 800 mg per day in divided doses with meals for best absorption.   For bone health, you need adequate vitamin D, and I recommend you supplement as it is harder to do so with diet alone. I recommend cholecalciferol 800 units daily.  Also, please ensure you are following a diet high in calcium -- research shows better outcomes with dietary sources including kale, yogurt, broccolii, cheese, okra, almonds- to name a few.     Also remember that exercise is a great medicine for maintain and preserve bone health. Advise moderate exercise for 30 minutes , 3 times per week.    start Zepbound 2.'5mg'$  once per week injected subcutaneously ( [place])  in stomach. Please clean with alcohol swab prior to injection and be sure to rotate site. You may  schedule a nurse visit if you would like to first injection.   After 4 weeks, and if tolerated and weight loss has not reached 1-2 lbs per week, please increase to '5mg'$  once per week [place].  Once you are actively losing weight, you do not need to further increase medication.  Dose increments are below.   7.5 mg/0.5 mL (0.5 mL) 10 mg/0.5 mL (0.5 mL) 12.5 mg/0.5 mL (0.5 mL) 15 mg/0.5 mL (0.5 mL)   Please read information on medication below and remember black box warning that you may not take if you or a family member is diagnosed with thyroid cancer (medullary thyroid cancer), or multiple endocrine neoplasia.       Brand Names: [place] Mounjaro; Zepbound Brand Names: [place] Mounjaro  Warning  This drug has been shown to cause thyroid cancer in some animals. It is not known if this happens in humans. If thyroid cancer happens, it may be deadly if not found and treated early. Call your doctor right away if you have a neck mass, trouble breathing, trouble swallowing, or have hoarseness that will not go away.  Do not use this drug if you have a health problem called Multiple Endocrine Neoplasia syndrome type 2 (MEN 2), or if you or a family member have had thyroid cancer.  Have your blood work checked and thyroid ultrasounds as you have been told by your doctor. What is this  drug used for?  It is used to lower blood sugar in people with type 2 diabetes.  It is used to help with weight loss in certain people. What do I need to tell my doctor BEFORE I take this drug? All products:  If you are allergic to this drug; any part of this drug; or any other drugs, foods, or substances. Tell your doctor about the allergy and what signs you had.  If you have ever had pancreatitis.  If you have stomach or bowel problems.  If you are using another drug that has the same drug in it.  If you are using another drug like this one. If you are not sure, ask your doctor or pharmacist. If you are using this drug for  diabetes:  If you have type 1 diabetes. Do not use this drug to treat type 1 diabetes. Zepbound:  If you have or have ever had depression or thoughts of suicide. This is not a list of all drugs or health problems that interact with this drug. Tell your doctor and pharmacist about all of your drugs (prescription or OTC, natural products, vitamins) and health problems. You must check to make sure that it is safe for you to take this drug with all of your drugs and health problems. Do not start, stop, or change the dose of any drug without checking with your doctor. What are some things I need to know or do while I take this drug? All products:  Tell all of your health care providers that you take this drug. This includes your doctors, nurses, pharmacists, and dentists.  Follow the diet and workout plan that your doctor told you about.  Talk with your doctor before you drink alcohol.  Birth control pills may not work as well to prevent pregnancy. If you take birth control pills, you may need to switch to another type of hormone-based birth control like a vaginal ring if your doctor tells you to. If another type of hormone-based birth control is not an option, use some other kind of birth control also, like a condom. Do this for 4 weeks after starting this drug and for 4 weeks each time the dose is raised.  This drug may prevent other drugs taken by mouth from getting into the body. If you take other drugs by mouth, you may need to take them at some other time than this drug. Talk with your doctor.  Do not share with another person even if the needle has been changed. Sharing your tray or pen may pass infections from one person to another. This includes infections you may not know you have.  If you cannot drink liquids by mouth or if you have upset stomach, throwing up, or diarrhea that does not go away; you need to avoid getting dehydrated. Contact your doctor to find out what to do. Dehydration may lead  to low blood pressure or to new or worse kidney problems.  A severe and sometimes deadly pancreas problem (pancreatitis) has happened with other drugs like this one. If you are using this drug for diabetes:  It may be harder to control blood sugar during times of stress such as fever, infection, injury, or surgery. A change in physical activity, exercise, or diet may also affect blood sugar.  Check your blood sugar as you have been told by your doctor.  Do not drive if your blood sugar has been low. There is a greater chance of you having a crash.  Wear disease medical alert ID (identification).  Tell your doctor if you are pregnant, plan on getting pregnant, or are breast-feeding. You will need to talk about the benefits and risks to you and the baby. Zepbound:  If you have high blood sugar (diabetes), you will need to watch your blood sugar closely.  Weight loss during pregnancy may cause harm to the unborn baby. If you get pregnant while taking this drug or if you want to get pregnant, call your doctor right away.  Tell your doctor if you are breast-feeding. You will need to talk about any risks to your baby. What are some side effects that I need to call my doctor about right away? WARNING/CAUTION: Even though it may be rare, some people may have very bad and sometimes deadly side effects when taking a drug. Tell your doctor or get medical help right away if you have any of the following signs or symptoms that may be related to a very bad side effect: All products:  Signs of an allergic reaction, like rash; hives; itching; red, swollen, blistered, or peeling skin with or without fever; wheezing; tightness in the chest or throat; trouble breathing, swallowing, or talking; unusual hoarseness; or swelling of the mouth, face, lips, tongue, or throat.  Signs of kidney problems like unable to pass urine, change in how much urine is passed, blood in the urine, or a big weight gain.  Signs of  gallbladder problems like pain in the upper right belly area, right shoulder area, or between the shoulder blades; yellow skin or eyes; fever with chills; bloating; or very upset stomach or throwing up.  Signs of a pancreas problem (pancreatitis) like very bad stomach pain, very bad back pain, or very bad upset stomach or throwing up.  Dizziness or passing out.  A fast heartbeat.  Change in eyesight.  Low blood sugar can happen. The chance may be raised when this drug is used with other drugs for diabetes. Signs may be dizziness, headache, feeling sleepy or weak, shaking, fast heartbeat, confusion, hunger, or sweating. Call your doctor right away if you have any of these signs. Follow what you have been told to do for low blood sugar. This may include taking glucose tablets, liquid glucose, or some fruit juices. Zepbound:  New or worse behavior or mood changes like depression or thoughts of suicide. What are some other side effects of this drug? All drugs may cause side effects. However, many people have no side effects or only have minor side effects. Call your doctor or get medical help if any of these side effects or any other side effects bother you or do not go away: All products:  Constipation, diarrhea, stomach pain, upset stomach, throwing up, or decreased appetite.  Heartburn.  Pain, itching, or other irritation where the injection was given. Zepbound:  Feeling tired or weak. These are not all of the side effects that may occur. If you have questions about side effects, call your doctor. Call your doctor for medical advice about side effects. You may report side effects to your national health agency. How is this drug best taken? Use this drug as ordered by your doctor. Read all information given to you. Follow all instructions closely. All products:  It is given as a shot into the fatty part of the skin on the top of the thigh, belly area, or upper arm.  If you will be giving yourself  the shot, your doctor or nurse will teach you how  to give the shot.  Keep taking this drug as you have been told by your doctor or other health care provider, even if you feel well.  Take the same day each week.  Move site where you give the shot each time.  Take with or without food.  Wash your hands before and after use.  Do not use if the solution is leaking or has particles.  This drug is colorless to a faint yellow. Do not use if the solution changes color.  Do not move this drug from the pen to a syringe.  Each pen or vial is for 1 use only. Throw away any part of the used pen after the dose is given.  Throw away needles in a needle/sharp disposal box. Do not reuse needles or other items. When the box is full, follow all local rules for getting rid of it. Talk with a doctor or pharmacist if you have any questions. If you are using this drug for diabetes:  If you are also using insulin, you may inject this drug and the insulin in the same area of the body but not right next to each other.  Do not mix this drug in the same syringe with insulin. What do I do if I miss a dose?  If it is within 4 days after the missed dose, take the missed dose and go back to your normal day.  If it has been more than 4 days since the missed dose, skip the missed dose and go back to your normal day.  Do not take 2 doses at the same time or extra doses. How do I store and/or throw out this drug?  Store in a refrigerator. Do not freeze.  Do not use if it has been frozen.  If needed, each pen or vial may be stored at room temperature for up to 21 days. If you store at room temperature, throw away any part not used after 21 days.  Protect from heat.  Store in the original container to protect from light.  Keep all drugs in a safe place. Keep all drugs out of the reach of children and pets.  Throw away unused or expired drugs. Do not flush down a toilet or pour down a drain unless you are told to do so. Check  with your pharmacist if you have questions about the best way to throw out drugs. There may be drug take-back programs in your area. General drug facts  If your symptoms or health problems do not get better or if they become worse, call your doctor.  Do not share your drugs with others and do not take anyone else's drugs.  Some drugs may have another patient information leaflet. If you have any questions about this drug, please talk with your doctor, nurse, pharmacist, or other health care provider.  If you think there has been an overdose, call your poison control center or get medical care right away. Be ready to tell or show what was taken, how much, and when it happened. Last Reviewed Date 2022-08-30 Consumer Information Use and Disclaimer This generalized information is a limited summary of diagnosis, treatment, and/or medication information. It is not meant to be comprehensive and should be used as a tool to help the user understand and/or assess potential diagnostic and treatment options. It does NOT include all information about conditions, treatments, medications, side effects, or risks that may apply to a specific patient. It is not intended to be medical  advice or a substitute for the medical advice, diagnosis, or treatment of a health care provider based on the health care provider's examination and assessment of a patient's specific and unique circumstances. Patients must speak with a health care provider for complete information about their health, medical questions, and treatment options, including any risks or benefits regarding use of medications. This information does not endorse any treatments or medications as safe, effective, or approved for treating a specific patient. UpToDate, Inc. and its affiliates disclaim any warranty or liability relating to this information or the use thereof. The use of this information is governed by the Terms of Use, available at  https://www.wolterskluwer.com/en/know/clinical-effectiveness-terms.  [place]. and its affiliates and/or [place]. All rights reserved. Use of UpToDate is subject to the Terms of Use. Topic T7610027 Version 16.0

## 2022-10-21 NOTE — Assessment & Plan Note (Signed)
Reviewed previous left shoulder MRI, cervical spine MRI.  Advised patient the importance of further follow-up with pain management patient may benefit from [facility]. She will call Dr Rosalie Doctor.  Discussed how to schedule Tylenol arthritis.Advised to avoid NSAIDs due to her h/o GERD.

## 2022-10-21 NOTE — Assessment & Plan Note (Signed)
Suboptimal control.  Increase trazodone to 100 mg nightly.

## 2022-10-21 NOTE — Progress Notes (Signed)
Assessment & Plan:  Morbid obesity with BMI of 40.0-44.9, adult ([place]) -     Zepbound; Inject 2.5 mg into the skin once a week.  Dispense: 2 mL; Refill: 1 -     Hemoglobin A1c  Anemia, unspecified type -     B12 and Folate Panel -     CBC with Differential/Platelet -     IBC + Ferritin  History of vitamin D deficiency -     VITAMIN D 25 Hydroxy (Vit-D Deficiency, Fractures)  Acute pain of left shoulder Assessment & Plan: Reviewed previous left shoulder MRI, cervical spine MRI.  Advised patient the importance of further follow-up with pain management patient may benefit from [facility] She will call Dr Rosalie Doctor.  Discussed how to schedule Tylenol arthritis.Advised to avoid NSAIDs due to her h/o GERD.    Morbid obesity ([place]) Assessment & Plan: BMI 46. H/o preDm.  We agreed to stop Trulicity and trial Zepbound to see if more effective.  Close follow-up   Trouble in sleeping Assessment & Plan: Suboptimal control.  Increase trazodone to 100 mg nightly.        Return precautions given.   Risks, benefits, and alternatives of the medications and treatment plan prescribed today were discussed, and patient expressed understanding.   Education regarding symptom management and diagnosis given to patient on AVS either electronically or printed.  Return in about 3 months (around 01/20/2023).  Mable Paris, FNP  Subjective:    Patient ID: [name], female    DOB: 03/06/1951, 72 y.o.   MRN: [id number]  CC: JANEENE SAND is a 72 y.o. female who presents today for follow up.   HPI: Left shoulder pain is unchanged. She describes spending most of day sitting in chair , holding her phone in her chair. She suspects may have made worse. She sleep on left side.   Left neck pain would worsen with movement which would radiate to chest. Chest pain has since resolved.   No numbness in left arm.  She tried lidocaine patch, tylenol '500mg'$  with minimal relief.    She has had PCV13 She is due  for PCV20  She has been compliant with trulicity. She would like to trial another weight loss medication.    Appointment tomorrow to discuss GERD, colonoscopy due.    ED follow-up 09/13/2022.  Presented with left upper back shoulder and chest pain. Pain exacerbated with movement. Hemoglobin 10, creatinine 1.07 Troponin negative x 2. Felt to be primarily musculoskeletal and provided Tylenol and lidocaine patch   Trazodone is not helping fall asleep.   Chest x-ray 12/ 10/2021 no active cardiopulmonary disease  MRI left shoulder 10/ 09/2022 shows intact rotator cuff, mild rotator cuff tendinosis, moderate AC osteoarthritis MRI cervical spine 07/25/2022 shows progressive multilevel cervical spondylosis, now severe left neural foraminal stenosis at C3-C4, new mild spinal canal and moderate right neural foraminal stenosis at C5-C6. She had last visit with pain management, 05/2022 Allergies: Patient has no known allergies. Current Outpatient Medications on File Prior to Visit  Medication Sig Dispense Refill   ACIPHEX 20 MG tablet TAKE 1 TABLET (20 MG TOTAL) BY MOUTH IN THE MORNING AND AT BEDTIME.(DAW 1-BRAND NAME) 180 tablet 1   lidocaine (LIDODERM) 5 % Place 1 patch onto the skin daily. Remove & Discard patch within 12 hours or as directed by MD 30 patch 0   losartan (COZAAR) 50 MG tablet TAKE 1 TABLET BY MOUTH EVERY DAY 90 tablet 1   ondansetron (ZOFRAN-ODT) 4  MG disintegrating tablet TAKE 1 TABLET BY MOUTH EVERY 8 HOURS AS NEEDED FOR NAUSEA AND VOMITING 30 tablet 1   PARoxetine (PAXIL) 10 MG tablet Take 1 tablet (10 mg total) by mouth daily. 90 tablet 3   potassium chloride (KLOR-CON M) 10 MEQ tablet Take 1 tablet (10 mEq total) by mouth daily. 120 tablet 1   traZODone (DESYREL) 50 MG tablet TAKE 1/2 TO 1 TABLET BY MOUTH AT BEDTIME AS NEEDED FOR SLEEP 90 tablet 2   hydrochlorothiazide (HYDRODIURIL) 25 MG tablet Take 1 tablet (25 mg total) by mouth daily. Please contact the office for a follow  up appointment with Dr. Garen Lah. 90 tablet 0   No current facility-administered medications on file prior to visit.    Review of Systems  Constitutional:  Negative for chills and fever.  Respiratory:  Negative for cough.   Cardiovascular:  Negative for chest pain and palpitations.  Gastrointestinal:  Negative for nausea and vomiting.  Musculoskeletal:  Positive for arthralgias and back pain.  Neurological:  Negative for numbness.  Psychiatric/Behavioral:  Positive for sleep disturbance.       Objective:    BP 118/72   Pulse 84   Temp 98.1 F (36.7 C)   Ht '5\' 3"'$  (1.6 m)   Wt 262 lb (118.8 kg)   SpO2 96%   BMI 46.41 kg/m  BP Readings from Last 3 Encounters:  10/21/22 118/72  09/13/22 115/69  07/15/22 122/82   Wt Readings from Last 3 Encounters:  10/21/22 262 lb (118.8 kg)  09/13/22 253 lb (114.8 kg)  07/15/22 253 lb 3.2 oz (114.9 kg)    Physical Exam Vitals reviewed.  Constitutional:      Appearance: She is well-developed.  Eyes:     Conjunctiva/sclera: Conjunctivae normal.  Cardiovascular:     Rate and Rhythm: Normal rate and regular rhythm.     Pulses: Normal pulses.     Heart sounds: Normal heart sounds.  Pulmonary:     Effort: Pulmonary effort is normal.     Breath sounds: Normal breath sounds. No wheezing, rhonchi or rales.  Skin:    General: Skin is warm and dry.  Neurological:     Mental Status: She is alert.  Psychiatric:        Speech: Speech normal.        Behavior: Behavior normal.        Thought Content: Thought content normal.

## 2022-10-21 NOTE — Assessment & Plan Note (Signed)
BMI 46. H/o preDm.  We agreed to stop Trulicity and trial Zepbound to see if more effective.  Close follow-up

## 2022-10-22 NOTE — Telephone Encounter (Signed)
Sending to Pharm d to do PA for Zepbound and then if it is not approved, Pt stated that she is willing to try the [facility].

## 2022-10-23 ENCOUNTER — Other Ambulatory Visit (HOSPITAL_COMMUNITY): Payer: Self-pay

## 2022-10-23 NOTE — Telephone Encounter (Signed)
Per test claim, insurance paid for medication 10/20/2022. Is PA still needed?

## 2022-10-25 ENCOUNTER — Telehealth: Payer: Self-pay | Admitting: Family

## 2022-10-25 NOTE — Telephone Encounter (Signed)
Spoke to pt to inform her that I received a message stating that rx Zepbound had been paid for on 10/20/2022. Pt was unaware but she stated that she would find out and call me back

## 2022-10-25 NOTE — Telephone Encounter (Signed)
Pt called stating her insurance need another PA form sent to them for tirzepatide

## 2022-10-28 ENCOUNTER — Telehealth: Payer: Self-pay | Admitting: Pharmacy Technician

## 2022-10-28 ENCOUNTER — Other Ambulatory Visit (HOSPITAL_COMMUNITY): Payer: Self-pay

## 2022-10-28 NOTE — Telephone Encounter (Addendum)
Pharmacy Patient Advocate Encounter   Received notification from RX request msgs that prior authorization for Zepbound 2.'5mg'$  is required/requested.  Per Test Claim: Part D Exclusion Plan Exclusion  Since it says Part D exclusion, it may not be covered, but I submitted the PA anyway. PA submitted on 10/28/22 to (ins) [employer] via CoverMyMeds [facility] PA Case ID: [id number] Status is pending

## 2022-10-29 NOTE — Telephone Encounter (Signed)
Pt called in today asking for Jenate to call her back because she needs help with a PA.she's available at [phone].

## 2022-10-30 NOTE — Telephone Encounter (Signed)
Spoke to pt and she stated that she had spoke to [employer] and they will not approve the Zepbound, but they will approved the [employer]

## 2022-10-30 NOTE — Telephone Encounter (Signed)
noted 

## 2022-10-31 NOTE — Telephone Encounter (Signed)
Pt notified of PA being submitted.

## 2022-11-01 MED ORDER — TIRZEPATIDE 2.5 MG/0.5ML ~~LOC~~ SOAJ: 2.5000 mg | SUBCUTANEOUS | 1 refills | Status: DC

## 2022-11-01 NOTE — Telephone Encounter (Signed)
Patient was notified that Zepbound was denied.

## 2022-11-01 NOTE — Telephone Encounter (Signed)
Spoke to pt and informed her that Mounjaro 2.5 has been sent in and she needs to call back in 4 weeks to increase if tolerating well and schedule f/up in couple mnths. Pt verbalized understanding

## 2022-11-01 NOTE — Telephone Encounter (Signed)
Call pt I sent in mounjaro 2.'5mg'$  Advised her to call the office in 4 weeks so I can increase the dose if she is tolerating.  Please ensure she has follow-up with me in the next couple of months

## 2022-11-01 NOTE — Telephone Encounter (Signed)
Pharmacy Patient Advocate Encounter  Received notification from [employer] that the request for prior authorization for Zepbound 2.'5MG'$ /0.5ML pen-injectors has been denied due to not meeting the prior authorization requirements.      Key: [id number]

## 2022-11-01 NOTE — Addendum Note (Signed)
Addended by: Burnard Hawthorne on: 11/01/2022 02:10 PM   Modules accepted: Orders

## 2022-11-01 NOTE — Telephone Encounter (Signed)
Spoke to pt and informed her that the PA for Zepbound was denied. Pt would like to know is there something else you can recommend. Pt stated that the only thing they approved was the [facility]

## 2022-11-01 NOTE — Telephone Encounter (Signed)
Zepbound (Tirzepatide) was denied.   Per [employer]:   Key: [id number]

## 2022-11-05 DIAGNOSIS — R937 Abnormal findings on diagnostic imaging of other parts of musculoskeletal system: Secondary | ICD-10-CM | POA: Insufficient documentation

## 2022-11-05 NOTE — Progress Notes (Unsigned)
PROVIDER NOTE: Information contained herein reflects review and annotations entered in association with encounter. Interpretation of such information and data should be left to medically-trained personnel. Information provided to patient can be located elsewhere in the medical record under "Patient Instructions". Document created using STT-dictation technology, any transcriptional errors that may result from process are unintentional.    Patient: Jacqueline Delgado  Service Category: E/M  Provider: Gaspar Cola, MD  DOB: 1951/05/18  DOS: 11/06/2022  Referring Provider: Burnard Hawthorne, FNP  MRN: [id number]  Specialty: Interventional Pain Management  PCP: Burnard Hawthorne, FNP  Type: Established Patient  Setting: Ambulatory outpatient    Location: Office  Delivery: Face-to-face     HPI  [name], a 72 y.o. year old female, is here today because of her Left cervical radiculopathy [M54.12]. Ms. Petralia primary complain today is Arm Pain and Shoulder Pain Last encounter: My last encounter with her was on 06/11/2022. Pertinent problems: Ms. Peoples has Plantar fasciitis; Localized swelling of finger of left hand; Acquired trigger finger; Closed nondisplaced fracture of base of fifth metacarpal bone of right hand; Complete tear of right rotator cuff; DDD (degenerative disc disease), cervical; DDD (degenerative disc disease), lumbar; Chronic low back pain (1ry area of Pain) (Bilateral) (R>L) w/ sciatica (Right); Neck pain; Non-cardiac chest pain; S/P rotator cuff repair; Osteoarthritis, knee; Unspecified osteoarthritis, unspecified site; Osteoarthritis of knee (Left); Ankle swelling, right; Chronic pain syndrome; DDD (degenerative disc disease), lumbosacral; Lumbar facet hypertrophy; Abnormal MRI, lumbar spine (08/11/20); Lumbosacral central spinal stenosis (9 mm) (L4-5 and L5-S1); Chronic lower extremity pain (Right); Lumbosacral radiculopathy at L5 (Right); Chronic knee pain (3ry area of Pain)  (Left); Swelling of foot (4th area of Pain) (Right); Chronic lower extremity pain (2ry area of Pain) (Bilateral) (R>L); Calcaneal spur of foot (Right); Lumbar Grade 1 Anterolisthesis of L4/L5; Lumbosacral facet syndrome (Bilateral) (R>L); Chronic low back pain (Bilateral) (R>L) w/o sciatica; Spondylosis without myelopathy or radiculopathy, lumbosacral region; S/P TKR (total knee replacement) using cement, left; Chronic knee pain after total replacement of knee joint (Left); Osteoarthritis of lumbar spine; Osteoarthritis involving multiple joints; Acute midline low back pain without sciatica; Left shoulder pain; Abnormal MRI, cervical spine (07/25/2022); Cervicalgia; Cervical foraminal stenosis (Severe, Left: C3-4, C4-5) (Bilateral: C5-6, C6-7); Radicular pain of shoulder (Left); and Cervical radiculopathy (Left) (C4, C5) on their pertinent problem list. Pain Assessment: Severity of Chronic pain is reported as a 7 /10. Location: Shoulder Right, Left/Radaites from bilateral shoulders into arms bilateral into ankles. Onset: More than a month ago. Quality: Constant, Aching, Shooting, Numbness (Deep). Timing: Constant. Modifying factor(s): Denies. Vitals:  height is '5\' 3"'$  (1.6 m) and weight is 265 lb (120.2 kg). Her temporal temperature is 98.2 F (36.8 C). Her blood pressure is 137/89 and her pulse is 79. Her respiration is 18 and oxygen saturation is 99%.  BMI: Estimated body mass index is 46.94 kg/m as calculated from the following:   Height as of this encounter: '5\' 3"'$  (1.6 m).   Weight as of this encounter: 265 lb (120.2 kg).  Reason for encounter: new problems.  The patient indicates having upper extremity pain, numbness, and weakness, shoulder pain, and refers that everything started with the neck where she is still having some pain on the left side.  Specifically the patient indicates her primary area of pain out to be that of the upper extremities (Bilateral) (L>R).  In the case of the left upper  extremity the pain goes down through the lateral aspect of  the upper arm down to the elbow area with numbness that it is intermittent and tends to affect the entire hand.  She also refers having some intermittent weakness.  In the case of the right upper extremity she indicates that the painful area to also be the lateral aspect of the upper arm down to the elbow with numbness that its only when she as well as weakness where she has noticed difficulty opening jars.  She refers being right-handed.  The patient describes her secondary area pain is that of the shoulders (Bilateral) (L>R).  She describes restrictive range of motion in both shoulders but the left is worse than the right.  She indicates decrease and uncomfortable range of motion on abduction, shoulder extension, and internal rotation.  The third area pain is described to be that of the neck.  She indicates that currently she is not having any pain in the neck except for the left side where she has pain that appears to go from the lateral and posterior aspect of the neck down onto the left shoulder over the suprascapular muscle area.  She also complains of left upper back pain around the rhomboid muscle (C5).  The patient recently had an MRI of the left shoulder done on 07/25/2022 that reads as follows: IMPRESSION: 1. Intact rotator cuff. Mild rotator cuff tendinosis (supraspinatus, infraspinatus, and subscapularis tendinosis). 2. Mild glenohumeral and moderate acromioclavicular osteoarthritis.  In addition she also had an MRI of the cervical spine done on 07/25/2022 which reads: IMPRESSION: 1. Progressive multilevel cervical spondylosis as described above. Progressive now severe left neuroforaminal stenosis at C3-4 (C4-NR). Unchanged severe left neuroforaminal stenosis at C4-5 (C5-NR). 2. New mild spinal canal and moderate right neuroforaminal stenosis at C5-6 (C6-NR). 3. Unchanged mild spinal canal and moderate bilateral neuroforaminal  stenosis at C6-7 (C7-NR).  Based on the above information, it would seem that the patient is having a left-sided cervical radiculopathy primarily affecting the C4 and C5 nerve roots, with intermittent involvement of lower levels.  She also has some evidence of tendinopathy affecting the left shoulder, but this is likely to improve with physical therapy of the left shoulder.  However in the case of the cervical spine, I have offered her a cervical epidural steroid injection trial, which if ineffective then we will need to move onto a referral to neurosurgery for decompressive foraminotomies.  Today I took quite a bit of time to explain the patient the mechanics of the pathology.  She was rather concerned about the cervical epidural steroid injection since she experienced some pain after her last set of lumbar facet blocks.  She requested to have it done under anesthesia but I have explained to the patient that that is contraindicated in the case of any interventional therapies.  I did offer her some sedation and anxiolysis, but I also explained to her the reasons why we would not be doing around her anesthesia.  She understood and accepted.  Pharmacotherapy Assessment  Analgesic: No chronic opioid analgesics therapy prescribed by our practice. Tramadol 50 mg tablet, 1 tab p.o. twice daily (PRN) MME/day: 0-10 mg/day   Monitoring: [place] PMP: PDMP reviewed during this encounter.       Pharmacotherapy: No side-effects or adverse reactions reported. Compliance: No problems identified. Effectiveness: Clinically acceptable.  Al Decant, RN  11/06/2022  8:11 AM  Sign when Signing Visit Safety precautions to be maintained throughout the outpatient stay will include: orient to surroundings, keep bed in low position, maintain call bell within reach  at all times, provide assistance with transfer out of bed and ambulation.     No results found for: "CBDTHCR" No results found for: "D8THCCBX" No results found  for: "D9THCCBX"  UDS:  Summary  Date Value Ref Range Status  05/22/2020 Note  Final    Comment:    ==================================================================== Compliance Drug Analysis, Ur ==================================================================== Test                             Result       Flag       Units  Drug Present and Declared for Prescription Verification   Tramadol                       >3268        EXPECTED   ng/mg creat   O-Desmethyltramadol            2372         EXPECTED   ng/mg creat   N-Desmethyltramadol            1509         EXPECTED   ng/mg creat    Source of tramadol is a prescription medication. O-desmethyltramadol    and N-desmethyltramadol are expected metabolites of tramadol.    Bupropion                      PRESENT      EXPECTED   Hydroxybupropion               PRESENT      EXPECTED    Hydroxybupropion is an expected metabolite of bupropion.  Drug Present not Declared for Prescription Verification   Dextromethorphan               PRESENT      UNEXPECTED   Dextrorphan/Levorphanol        PRESENT      UNEXPECTED    Dextrorphan is an expected metabolite of dextromethorphan, an over-    the-counter or prescription cough suppressant. Dextrorphan cannot be    distinguished from the scheduled prescription medication levorphanol    by the method used for analysis.  ==================================================================== Test                      Result    Flag   Units      Ref Range   Creatinine              153              mg/dL      >=20 ==================================================================== Declared Medications:  The flagging and interpretation on this report are based on the  following declared medications.  Unexpected results may arise from  inaccuracies in the declared medications.   **Note: The testing scope of this panel includes these medications:   Bupropion (Wellbutrin)  Tramadol (Ultram)   **Note:  The testing scope of this panel does not include the  following reported medications:   Famotidine (Pepcid)  Hydrochlorothiazide (Hydrodiuril)  Losartan (Cozaar)  Meloxicam (Mobic)  Mirabegron (Myrbetriq)  Rabeprazole (Aciphex) ==================================================================== For clinical consultation, please call [phone]. ====================================================================       ROS  Constitutional: Denies any fever or chills Gastrointestinal: No reported hemesis, hematochezia, vomiting, or acute GI distress Musculoskeletal: Denies any acute onset joint swelling, redness, loss of ROM, or weakness Neurological: No reported episodes of acute onset  apraxia, aphasia, dysarthria, agnosia, amnesia, paralysis, loss of coordination, or loss of consciousness  Medication Review  ALPRAZolam, PARoxetine, RABEprazole, hydrochlorothiazide, lidocaine, losartan, meloxicam, ondansetron, potassium chloride, predniSONE, promethazine, tiZANidine, tirzepatide, and traZODone  History Review  Allergy: Ms. Zakarian has No Known Allergies. Drug: Ms. Cowher  reports no history of drug use. Alcohol:  reports no history of alcohol use. Tobacco:  reports that she has never smoked. She has never used smokeless tobacco. Social: Ms. Fofana  reports that she has never smoked. She has never used smokeless tobacco. She reports that she does not drink alcohol and does not use drugs. Medical:  has a past medical history of Anemia, Anginal pain ([place]), Arthritis, Colon polyp, GERD (gastroesophageal reflux disease), Hiatal hernia, History of hiatal hernia, History of methicillin resistant staphylococcus aureus (MRSA), Hypertension, Obesity, Pre-diabetes, Reflux, Sleep apnea, and Small vessel disease ([place]). Surgical: Ms. Silverthorne  has a past surgical history that includes Joint replacement (Right); Joint replacement; Cholecystectomy; Abdominal hysterectomy; Shoulder arthroscopy with  rotator cuff repair and subacromial decompression (Right, 06/14/2015); Esophagogastroduodenoscopy (egd) with propofol (N/A, 10/11/2015); Savory dilation (N/A, 10/11/2015); Colonoscopy w/ polypectomy; Esophagogastroduodenoscopy (egd) with propofol (N/A, 12/10/2017); Colonoscopy with propofol (N/A, 12/10/2017); Total knee arthroplasty (Left, 03/06/2021); Joint replacement (Left); Esophagogastroduodenoscopy (egd) with propofol (N/A, 05/03/2022); and Breast surgery. Family: family history includes Alcoholism in an other family member; Arthritis in an other family member; Breast cancer in her maternal grandmother; Breast cancer (age of onset: 73) in her mother; Diabetes in an other family member; Heart disease in her father; Hyperlipidemia in her father; Hypertension in her father; Stroke in her father.  Laboratory Chemistry Profile   Renal Lab Results  Component Value Date   BUN 16 09/13/2022   CREATININE 1.07 (H) 09/13/2022   BCR 20 01/19/2021   GFR 57.12 (L) 07/10/2022   GFRAA 81 09/13/2020   GFRNONAA 56 (L) 09/13/2022    Hepatic Lab Results  Component Value Date   AST 11 03/13/2022   ALT 7 03/13/2022   ALBUMIN 4.0 03/13/2022   ALKPHOS 97 03/13/2022   LIPASE 36 10/12/2017    Electrolytes Lab Results  Component Value Date   NA 143 09/13/2022   K 3.5 09/13/2022   CL 108 09/13/2022   CALCIUM 9.5 09/13/2022   MG 2.0 05/22/2020    Bone Lab Results  Component Value Date   VD25OH 16.47 (L) 10/21/2022   25OHVITD1 18 (L) 05/22/2020   25OHVITD2 13 05/22/2020   25OHVITD3 5.3 05/22/2020    Inflammation (CRP: Acute Phase) (ESR: Chronic Phase) Lab Results  Component Value Date   CRP 15 (H) 05/22/2020   ESRSEDRATE 73 (H) 05/22/2020         Note: Above Lab results reviewed.  Recent Imaging Review  DG Chest 2 View CLINICAL DATA:  Chest pain  EXAM: CHEST - 2 VIEW  COMPARISON:  11/08/2021  FINDINGS: The heart size and mediastinal contours are within normal limits. Both lungs  are clear. The visualized skeletal structures are unremarkable.  IMPRESSION: No active cardiopulmonary disease.  Electronically Signed   By: Inez Catalina M.D.   On: 09/13/2022 02:13 Note: Reviewed        Physical Exam  General appearance: Well nourished, well developed, and well hydrated. In no apparent acute distress Mental status: Alert, oriented x 3 (person, place, & time)       Respiratory: No evidence of acute respiratory distress Eyes: PERLA Vitals: BP 137/89   Pulse 79   Temp 98.2 F (36.8 C) (Temporal)   Resp  18   Ht '5\' 3"'$  (1.6 m)   Wt 265 lb (120.2 kg)   SpO2 99%   BMI 46.94 kg/m  BMI: Estimated body mass index is 46.94 kg/m as calculated from the following:   Height as of this encounter: '5\' 3"'$  (1.6 m).   Weight as of this encounter: 265 lb (120.2 kg). Ideal: Ideal body weight: 52.4 kg (115 lb 8.3 oz) Adjusted ideal body weight: 79.5 kg (175 lb 5 oz)  Assessment   Diagnosis Status  1. Cervical radiculopathy (Left) (C4, C5)   2. Radicular pain of shoulder (Left)   3. Cervicalgia   4. Cervical foraminal stenosis (Severe, Left: C3-4, C4-5) (Bilateral: C5-6, C6-7)   5. DDD (degenerative disc disease), cervical   6. Abnormal MRI, cervical spine (07/25/2022)    Controlled Controlled Controlled   Updated Problems: Problem  Cervicalgia  Cervical foraminal stenosis (Severe, Left: C3-4, C4-5) (Bilateral: C5-6, C6-7)   (07/25/2022) CERVICAL MRI FINDINGS: Alignment: New mild anterolisthesis at C5-C6. LEVELS: C3-4: severe left neuroforaminal stenosis.  C4-5: severe left neuroforaminal stenosis. C5-6: moderate right and mild left neuroforaminal stenosis. C6-7: moderate bilateral neuroforaminal stenosis.   Radicular pain of shoulder (Left)   (07/25/2022) CERVICAL MRI FINDINGS: LEVELS: C3-4: severe left neuroforaminal stenosis. (C4 NR) C4-5: severe left neuroforaminal stenosis. (C5 NR)   Cervical radiculopathy (Left) (C4, C5)   (07/25/2022) CERVICAL MRI  FINDINGS: LEVELS: C3-4: severe left neuroforaminal stenosis. (C4 NR) C4-5: severe left neuroforaminal stenosis. (C5 NR) C5-6: moderate right and mild left neuroforaminal stenosis. C6-7: moderate bilateral neuroforaminal stenosis.   Abnormal MRI, cervical spine (07/25/2022)   (07/25/2022) CERVICAL MRI FINDINGS: Alignment: Chronic straightening of the normal cervical lordosis. New mild anterolisthesis at C5-C6.  DISC LEVELS: C3-C4: Negative disc. Progressive moderate to severe left facet uncovertebral hypertrophy. Progressive now severe left neuroforaminal stenosis.  C4-C5: Unchanged severe left and mild right facet uncovertebral hypertrophy. Unchanged severe left neuroforaminal stenosis. C5-C6: Progressive now circumferential disc osteophyte complex and bilateral uncovertebral hypertrophy. New mild spinal canal and moderate right and mild left neuroforaminal stenosis. C6-C7: Progressive disc height loss with unchanged small circumferential disc osteophyte complex. Unchanged mild spinal canal stenosis and moderate bilateral neuroforaminal stenosis. C7-T1: Unchanged moderate bilateral facet arthropathy.  IMPRESSION: 1. Progressive multilevel cervical spondylosis as described above.Progressive now severe left neuroforaminal stenosis at C3-C4. Unchanged severe left neuroforaminal stenosis at C4-C5. 2. New mild spinal canal and moderate right neuroforaminal stenosis at C5-C6. 3. Unchanged mild spinal canal and moderate bilateral neuroforaminal stenosis at C6-C7.   Abnormal MRI, lumbar spine (08/11/20)   (08/11/2020) LUMBAR MRI FINDINGS: Paraspinal and other soft tissues: Bilateral renal cysts DISC LEVELS: L3-4: Shallow disc bulge, facet degenerative changes ligamentum flavum redundancy resulting in narrowing of the bilateral subarticular zones. L4-5: Shallow disc bulge, moderate facet degenerative changes and ligamentum flavum redundancy  L5-S1: Disc bulge and moderate facet degenerative  changes resulting in mild bilateral neural foraminal narrowing.   IMPRESSION: Mild multilevel degenerative changes of the lumbar spine with mild bilateral neural foraminal narrowing at L5-S1 and mild narrowing of the bilateral subarticular zones at L3-4.   Uncomplicated Opioid Dependence ([facility])    Plan of Care  Problem-specific:  No problem-specific Assessment & Plan notes found for this encounter.  Ms. NASTASIA KAGE has a current medication list which includes the following long-term medication(s): aciphex, hydrochlorothiazide, losartan, paroxetine, potassium chloride, and trazodone.  Pharmacotherapy (Medications Ordered): No orders of the defined types were placed in this encounter.  Orders:  Orders Placed This Encounter  Procedures  Cervical Epidural Injection    Sedation: Patient's choice. Purpose: Diagnostic/Therapeutic Indication(s): Radiculitis and cervicalgia associater with cervical degenerative disc disease.    Standing Status:   Future    Standing Expiration Date:   02/05/2023    Scheduling Instructions:     Procedure: Cervical Epidural Steroid Injection/Block     Level(s): C7-T1     Laterality: Left-sided     Timeframe: As soon as schedule allows    Order Specific Question:   Where will this procedure be performed?    Answer:   [facility]    Comments:   by Dr. Dossie Arbour   Follow-up plan:   Return for (ECT): (L) CESI #1.      Interventional Therapies  Risk Factors  Considerations:  Cerebrovascular malformation  GERD  HTN MO  OSA  SOB No RFA until BMI is below 35 kg/m.   Planned  Pending:   Left CESI   Under consideration:      Completed:   Therapeutic/palliative bilateral lumbar facet MBB x3 (08/30/2021) (100/100/100/0) pain worsened after injection. Therapeutic right L4-5 LESI x1 (07/27/2020)  Referral to bariatric surgery and medical weight management entered on 07/18/2020.    Completed by other providers:   EEG x2 by Dr. Jennings Books  (07/03/14, 07/29/14) - WNL  Diagnostic left occipital nerve block x1 (08/10/2014) by Dr. Jennings Books [facility] neurology)  Therapeutic right rotator cuff repair x1 (06/14/2015) by Franchot Mimes MD [facility] orthopedics)  Therapeutic left IA Synvisc knee injection x2 (10/02/2020) by Janus Molder, PA [facility] orthopedics and PMR) Therapeutic left total knee replacement x1 (03/06/2021) by Dr. Hessie Knows [facility] orthopedics)   Therapeutic  Palliative (PRN) options:   Palliative bilateral lumbar facet MBB #4  Therapeutic right L4-5 LESI #2      Recent Visits No visits were found meeting these conditions. Showing recent visits within past 90 days and meeting all other requirements Today's Visits Date Type Provider Dept  11/06/22 Office Visit Milinda Pointer, MD [facility]  Showing today's visits and meeting all other requirements Future Appointments No visits were found meeting these conditions. Showing future appointments within next 90 days and meeting all other requirements  I discussed the assessment and treatment plan with the patient. The patient was provided an opportunity to ask questions and all were answered. The patient agreed with the plan and demonstrated an understanding of the instructions.  Patient advised to call back or seek an in-person evaluation if the symptoms or condition worsens.  Duration of encounter: 80 minutes.  Total time on encounter, as per AMA guidelines included both the face-to-face and non-face-to-face time personally spent by the physician and/or other qualified health care professional(s) on the day of the encounter (includes time in activities that require the physician or other qualified health care professional and does not include time in activities normally performed by clinical staff). Physician's time may include the following activities when performed: Preparing to see the patient (e.g., pre-charting review of records, searching for previously  ordered imaging, lab work, and nerve conduction tests) Review of prior analgesic pharmacotherapies. Reviewing PMP Interpreting ordered tests (e.g., lab work, imaging, nerve conduction tests) Performing post-procedure evaluations, including interpretation of diagnostic procedures Obtaining and/or reviewing separately obtained history Performing a medically appropriate examination and/or evaluation Counseling and educating the patient/family/caregiver Ordering medications, tests, or procedures Referring and communicating with other health care professionals (when not separately reported) Documenting clinical information in the electronic or other health record Independently interpreting results (not separately reported)  and communicating results to the patient/ family/caregiver Care coordination (not separately reported)  Note by: Gaspar Cola, MD Date: 11/06/2022; Time: 8:56 AM

## 2022-11-06 ENCOUNTER — Ambulatory Visit: Payer: 59 | Attending: Pain Medicine | Admitting: Pain Medicine

## 2022-11-06 ENCOUNTER — Encounter: Payer: Self-pay | Admitting: Pain Medicine

## 2022-11-06 VITALS — BP 137/89 | HR 79 | Temp 98.2°F | Resp 18 | Ht 63.0 in | Wt 265.0 lb

## 2022-11-06 DIAGNOSIS — M5030 Other cervical disc degeneration, unspecified cervical region: Secondary | ICD-10-CM | POA: Diagnosis not present

## 2022-11-06 DIAGNOSIS — M542 Cervicalgia: Secondary | ICD-10-CM | POA: Diagnosis not present

## 2022-11-06 DIAGNOSIS — M4802 Spinal stenosis, cervical region: Secondary | ICD-10-CM | POA: Insufficient documentation

## 2022-11-06 DIAGNOSIS — M5412 Radiculopathy, cervical region: Secondary | ICD-10-CM | POA: Insufficient documentation

## 2022-11-06 DIAGNOSIS — G8929 Other chronic pain: Secondary | ICD-10-CM

## 2022-11-06 DIAGNOSIS — R937 Abnormal findings on diagnostic imaging of other parts of musculoskeletal system: Secondary | ICD-10-CM | POA: Insufficient documentation

## 2022-11-06 NOTE — Progress Notes (Signed)
Safety precautions to be maintained throughout the outpatient stay will include: orient to surroundings, keep bed in low position, maintain call bell within reach at all times, provide assistance with transfer out of bed and ambulation.  

## 2022-11-06 NOTE — Patient Instructions (Signed)
______________________________________________________________________  Procedure instructions  Do not eat or drink fluids (other than water) for 6 hours before your procedure  No water for 2 hours before your procedure  Take your blood pressure medicine with a sip of water  Arrive 30 minutes before your appointment  Carefully read the "Preparing for your procedure" detailed instructions  If you have questions call us at [phone]  _____________________________________________________________________    ______________________________________________________________________  Preparing for your procedure  During your procedure appointment there will be: No Prescription Refills. No disability issues to discussed. No medication changes or discussions.  Instructions: Food intake: Avoid eating anything solid for at least 8 hours prior to your procedure. Clear liquid intake: You may take clear liquids such as water up to 2 hours prior to your procedure. (No carbonated drinks. No soda.) Transportation: Unless otherwise stated by your physician, bring a driver. Morning Medicines: Except for blood thinners, take all of your other morning medications with a sip of water. Make sure to take your heart and blood pressure medicines. If your blood pressure's lower number is above 100, the case will be rescheduled. Blood thinners: Make sure to stop your blood thinners as instructed.  If you take a blood thinner, but were not instructed to stop it, call our office [phone] and ask to talk to a nurse. Not stopping a blood thinner prior to certain procedures could lead to serious complications. Diabetics on insulin: Notify the staff so that you can be scheduled 1st case in the morning. If your diabetes requires high dose insulin, take only  of your normal insulin dose the morning of the procedure and notify the staff that you have done so. Preventing infections: Shower with an  antibacterial soap the morning of your procedure.  Build-up your immune system: Take 1000 mg of Vitamin C with every meal (3 times a day) the day prior to your procedure. Antibiotics: Inform the nursing staff if you are taking any antibiotics or if you have any conditions that may require antibiotics prior to procedures. (Example: recent joint implants)   Pregnancy: If you are pregnant make sure to notify the nursing staff. Not doing so may result in injury to the fetus, including death.  Sickness: If you have a cold, fever, or any active infections, call and cancel or reschedule your procedure. Receiving steroids while having an infection may result in complications. Arrival: You must be in the facility at least 30 minutes prior to your scheduled procedure. Tardiness: Your scheduled time is also the cutoff time. If you do not arrive at least 15 minutes prior to your procedure, you will be rescheduled.  Children: Do not bring any children with you. Make arrangements to keep them home. Dress appropriately: There is always a possibility that your clothing may get soiled. Avoid long dresses. Valuables: Do not bring any jewelry or valuables.  Reasons to call and reschedule or cancel your procedure: (Following these recommendations will minimize the risk of a serious complication.) Surgeries: Avoid having procedures within 2 weeks of any surgery. (Avoid for 2 weeks before or after any surgery). Flu Shots: Avoid having procedures within 2 weeks of a flu shots or . (Avoid for 2 weeks before or after immunizations). Barium: Avoid having a procedure within 7-10 days after having had a radiological study involving the use of radiological contrast. (Myelograms, Barium swallow or enema study). Heart attacks: Avoid any elective procedures or surgeries for the initial 6 months after a "Myocardial Infarction" (Heart Attack). Blood thinners: It  is imperative that you stop these medications before procedures. Let us  know if you if you take any blood thinner.  Infection: Avoid procedures during or within two weeks of an infection (including chest colds or gastrointestinal problems). Symptoms associated with infections include: Localized redness, fever, chills, night sweats or profuse sweating, burning sensation when voiding, cough, congestion, stuffiness, runny nose, sore throat, diarrhea, nausea, vomiting, cold or Flu symptoms, recent or current infections. It is specially important if the infection is over the area that we intend to treat. Heart and lung problems: Symptoms that may suggest an active cardiopulmonary problem include: cough, chest pain, breathing difficulties or shortness of breath, dizziness, ankle swelling, uncontrolled high or unusually low blood pressure, and/or palpitations. If you are experiencing any of these symptoms, cancel your procedure and contact your primary care physician for an evaluation.  Remember:  Regular Business hours are:  Monday to Thursday 8:00 AM to 4:00 PM  Provider's Schedule: Milinda Pointer, MD:  Procedure days: Tuesday and Thursday 7:30 AM to 4:00 PM  Gillis Santa, MD:  Procedure days: Monday and Wednesday 7:30 AM to 4:00 PM  ______________________________________________________________________    ____________________________________________________________________________________________  General Risks and Possible Complications  Patient Responsibilities: It is important that you read this as it is part of your informed consent. It is our duty to inform you of the risks and possible complications associated with treatments offered to you. It is your responsibility as a patient to read this and to ask questions about anything that is not clear or that you believe was not covered in this document.  Patient's Rights: You have the right to refuse treatment. You also have the right to change your mind, even after initially having agreed to have the treatment  done. However, under this last option, if you wait until the last second to change your mind, you may be charged for the materials used up to that point.  Introduction: Medicine is not an Chief Strategy Officer. Everything in Medicine, including the lack of treatment(s), carries the potential for danger, harm, or loss (which is by definition: Risk). In Medicine, a complication is a secondary problem, condition, or disease that can aggravate an already existing one. All treatments carry the risk of possible complications. The fact that a side effects or complications occurs, does not imply that the treatment was conducted incorrectly. It must be clearly understood that these can happen even when everything is done following the highest safety standards.  No treatment: You can choose not to proceed with the proposed treatment alternative. The "PRO(s)" would include: avoiding the risk of complications associated with the therapy. The "CON(s)" would include: not getting any of the treatment benefits. These benefits fall under one of three categories: diagnostic; therapeutic; and/or palliative. Diagnostic benefits include: getting information which can ultimately lead to improvement of the disease or symptom(s). Therapeutic benefits are those associated with the successful treatment of the disease. Finally, palliative benefits are those related to the decrease of the primary symptoms, without necessarily curing the condition (example: decreasing the pain from a flare-up of a chronic condition, such as incurable terminal cancer).  General Risks and Complications: These are associated to most interventional treatments. They can occur alone, or in combination. They fall under one of the following six (6) categories: no benefit or worsening of symptoms; bleeding; infection; nerve damage; allergic reactions; and/or death. No benefits or worsening of symptoms: In Medicine there are no guarantees, only probabilities. No  healthcare provider can ever guarantee that a  medical treatment will work, they can only state the probability that it may. Furthermore, there is always the possibility that the condition may worsen, either directly, or indirectly, as a consequence of the treatment. Bleeding: This is more common if the patient is taking a blood thinner, either prescription or over the counter (example: Goody Powders, Fish oil, Aspirin, Garlic, etc.), or if suffering a condition associated with impaired coagulation (example: Hemophilia, cirrhosis of the liver, low platelet counts, etc.). However, even if you do not have one on these, it can still happen. If you have any of these conditions, or take one of these drugs, make sure to notify your treating physician. Infection: This is more common in patients with a compromised immune system, either due to disease (example: diabetes, cancer, human immunodeficiency virus [HIV], etc.), or due to medications or treatments (example: therapies used to treat cancer and rheumatological diseases). However, even if you do not have one on these, it can still happen. If you have any of these conditions, or take one of these drugs, make sure to notify your treating physician. Nerve Damage: This is more common when the treatment is an invasive one, but it can also happen with the use of medications, such as those used in the treatment of cancer. The damage can occur to small secondary nerves, or to large primary ones, such as those in the spinal cord and brain. This damage may be temporary or permanent and it may lead to impairments that can range from temporary numbness to permanent paralysis and/or brain death. Allergic Reactions: Any time a substance or material comes in contact with our body, there is the possibility of an allergic reaction. These can range from a mild skin rash (contact dermatitis) to a severe systemic reaction (anaphylactic reaction), which can result in death. Death: In  general, any medical intervention can result in death, most of the time due to an unforeseen complication. ____________________________________________________________________________________________    _________________________________________________________________________________________  Body mass index (BMI)  Body mass index (BMI) is a common tool for deciding whether a person has an appropriate body weight.  It measures a persons weight in relation to their height.   According to the [facility] of health (NIH): A BMI of less than 18.5 means that a person is underweight. A BMI of between 18.5 and 24.9 is ideal. A BMI of between 25 and 29.9 is overweight. A BMI over 30 indicates obesity.  Weight Management Required  URGENT: Your weight has been found to be adversely affecting your health.  Dear Ms. [place]ague:  Your current Estimated body mass index is 46.41 kg/m as calculated from the following:   Height as of 10/21/22: '5\' 3"'$  (1.6 m).   Weight as of 10/21/22: 262 lb (118.8 kg).  Please use the table below to identify your weight category and associated incidence of chronic pain, secondary to your weight.  Body Mass Index (BMI) Classification BMI level (kg/m2) Category Associated incidence of chronic pain  <18  Underweight   18.5-24.9 Ideal body weight   25-29.9 Overweight  20%  30-34.9 Obese (Class I)  68%  35-39.9 Severe obesity (Class II)  136%  >40 Extreme obesity (Class III)  254%   In addition: You will be considered "Morbidly Obese", if your BMI is above 30 and you have one or more of the following conditions which are known to be caused and/or directly associated with obesity: 1.    Type 2 Diabetes (Which in turn can lead to cardiovascular diseases (CVD),  stroke, peripheral vascular diseases (PVD), retinopathy, nephropathy, and neuropathy) 2.    Cardiovascular Disease (High Blood Pressure; Congestive Heart Failure; High Cholesterol; Coronary Artery Disease; Angina;  or History of Heart Attacks) 3.    Breathing problems (Asthma; obesity-hypoventilation syndrome; obstructive sleep apnea; chronic inflammatory airway disease; reactive airway disease; or shortness of breath) 4.    Chronic kidney disease 5.    Liver disease (nonalcoholic fatty liver disease) 6.    High blood pressure 7.    Acid reflux (gastroesophageal reflux disease; heartburn) 8.    Osteoarthritis (OA) (with any of the following: hip pain; knee pain; and/or low back pain) 9.    Low back pain (Lumbar Facet Syndrome; and/or Degenerative Disc Disease) 10.  Hip pain (Osteoarthritis of hip) (For every 1 lbs of added body weight, there is a 2 lbs increase in pressure inside of each hip articulation. 1:2 mechanical relationship) 11.  Knee pain (Osteoarthritis of knee) (For every 1 lbs of added body weight, there is a 4 lbs increase in pressure inside of each knee articulation. 1:4 mechanical relationship) (patients with a BMI>30 kg/m2 were 6.8 times more likely to develop knee OA than normal-weight individuals) 12.  Cancer: Epidemiological studies have shown that obesity is a risk factor for: post-menopausal breast cancer; cancers of the endometrium, colon and kidney cancer; malignant adenomas of the oesophagus. Obese subjects have an approximately 1.5-3.5-fold increased risk of developing these cancers compared with normal-weight subjects, and it has been estimated that between 15 and 45% of these cancers can be attributed to overweight. More recent studies suggest that obesity may also increase the risk of other types of cancer, including pancreatic, hepatic and gallbladder cancer. (Ref: Obesity and cancer. Pischon T, Nthlings U, [employer] H. Proc Nutr Soc. 2008 May;67(2):128-45. doi: 03.2122/[id number].) The International Agency for Research on Cancer (IARC) has identified 13 cancers associated with overweight and obesity: meningioma, multiple myeloma, adenocarcinoma of the esophagus, and cancers of the  thyroid, postmenopausal breast cancer, gallbladder, stomach, liver, pancreas, kidney, ovaries, uterus, colon and rectal (colorectal) cancers. 33 percent of all cancers diagnosed in women and 24 percent of those diagnosed in men are associated with overweight and obesity.  Recommendation: At this point it is urgent that you take a step back and concentrate in loosing weight. Dedicate 100% of your efforts on this task. Nothing else will improve your health more than bringing your weight down and your BMI to less than 30. If you are here, you probably have chronic pain. We know that most chronic pain patients have difficulty exercising secondary to their pain. For this reason, you must rely on proper nutrition and diet in order to lose the weight. If your BMI is above 40, you should seriously consider bariatric surgery. A realistic goal is to lose 10% of your body weight over a period of 12 months.  Be honest to yourself, if over time you have unsuccessfully tried to lose weight, then it is time for you to seek professional help and to enter a medically supervised weight management program, and/or undergo bariatric surgery. Stop procrastinating.   Pain management considerations:  1.    Pharmacological Problems: Be advised that the use of opioid analgesics (oxycodone; hydrocodone; morphine; methadone; codeine; and all of their derivatives) have been associated with decreased metabolism and weight gain.  For this reason, should we see that you are unable to lose weight while taking these medications, it may become necessary for [place] to taper down and indefinitely discontinue them.  2.  Technical Problems: The incidence of successful interventional therapies decreases as the patient's BMI increases. It is much more difficult to accomplish a safe and effective interventional therapy on a patient with a BMI above 35. 3.    Radiation Exposure Problems: The x-rays machine, used to accomplish injection therapies, will  automatically increase their x-ray output in order to capture an appropriate bone image. This means that radiation exposure increases exponentially with the patient's BMI. (The higher the BMI, the higher the radiation exposure.) Although the level of radiation used at a given time is still safe to the patient, it is not for the physician and/or assisting staff. Unfortunately, radiation exposure is accumulative. Because physicians and the staff have to do procedures and be exposed on a daily basis, this can result in health problems such as cancer and radiation burns. Radiation exposure to the staff is monitored by the radiation batches that they wear. The exposure levels are reported back to the staff on a quarterly basis. Depending on levels of exposure, physicians and staff may be obligated by law to decrease this exposure. This means that they have the right and obligation to refuse providing therapies where they may be overexposed to radiation. For this reason, physicians may decline to offer therapies such as radiofrequency ablation or implants to patients with a BMI above 40. 4.    Current Trends: Be advised that the current trend is to no longer offer certain therapies to patients with a BMI equal to, or above 35, due to increase perioperative risks, increased technical procedural difficulties, and excessive radiation exposure to healthcare personnel.  _________________________________________________________________________________________   ____________________________________________________________________________________________  Patient Information update  To: All of our patients.  Re: Name change.  It has been made official that our current name, "[facility]"   will soon be changed to "[facility]".   The purpose of this change is to eliminate any confusion created by the  concept of our practice being a "Medication Management Pain Clinic". In the past this has led to the misconception that we treat pain primarily by the use of prescription medications.  Nothing can be farther from the truth.   Understanding PAIN MANAGEMENT: To further understand what our practice does, you first have to understand that "Pain Management" is a subspecialty that requires additional training once a physician has completed their specialty training, which can be in either Anesthesia, Neurology, Psychiatry, or Physical Medicine and Rehabilitation (PMR). Each one of these contributes to the final approach taken by each physician to the management of their patient's pain. To be a "Pain Management Specialist" you must have first completed one of the specialty trainings below.  Anesthesiologists - trained in clinical pharmacology and interventional techniques such as nerve blockade and regional as well as central neuroanatomy. They are trained to block pain before, during, and after surgical interventions.  Neurologists - trained in the diagnosis and pharmacological treatment of complex neurological conditions, such as Multiple Sclerosis, Parkinson's, spinal cord injuries, and other systemic conditions that may be associated with symptoms that may include but are not limited to pain. They tend to rely primarily on the treatment of chronic pain using prescription medications.  Psychiatrist - trained in conditions affecting the psychosocial wellbeing of patients including but not limited to depression, anxiety, schizophrenia, personality disorders, addiction, and other substance use disorders that may be associated with chronic pain. They tend to rely primarily on the treatment of chronic pain  using prescription medications.   Physical Medicine and Rehabilitation (PMR) physicians, also known as physiatrists - trained to treat a wide variety of medical conditions affecting the brain, spinal cord, nerves,  bones, joints, ligaments, muscles, and tendons. Their training is primarily aimed at treating patients that have suffered injuries that have caused severe physical impairment. Their training is primarily aimed at the physical therapy and rehabilitation of those patients. They may also work alongside orthopedic surgeons or neurosurgeons using their expertise in assisting surgical patients to recover after their surgeries.  INTERVENTIONAL PAIN MANAGEMENT is sub-subspecialty of Pain Management.  Our physicians are Board-certified in Anesthesia, Pain Management, and Interventional Pain Management.  This meaning that not only have they been trained and Board-certified in their specialty of Anesthesia, and subspecialty of Pain Management, but they have also received further training in the sub-subspecialty of Interventional Pain Management, in order to become Board-certified as INTERVENTIONAL PAIN MANAGEMENT SPECIALIST.    Mission: Our goal is to use our skills in  [place] as alternatives to the chronic use of prescription opioid medications for the treatment of pain. To make this more clear, we have changed our name to reflect what we do and offer. We will continue to offer medication management assessment and recommendations, but we will not be taking over any patient's medication management.  ____________________________________________________________________________________________

## 2022-11-18 ENCOUNTER — Other Ambulatory Visit: Payer: Self-pay | Admitting: Family

## 2022-11-22 NOTE — Progress Notes (Unsigned)
PROVIDER NOTE: Interpretation of information contained herein should be left to medically-trained personnel. Specific patient instructions are provided elsewhere under "Patient Instructions" section of medical record. This document was created in part using STT-dictation technology, any transcriptional errors that may result from this process are unintentional.  Patient: Jacqueline Delgado Type: Established DOB: April 22, 1951 MRN: [id number] PCP: Burnard Hawthorne, FNP  Service: Procedure DOS: 11/26/2022 Setting: Ambulatory Location: Ambulatory outpatient facility Delivery: Face-to-face Provider: Gaspar Cola, MD Specialty: Interventional Pain Management Specialty designation: 09 Location: Outpatient facility Ref. Prov.: Milinda Pointer, MD       Interventional Therapy   Procedure: Cervical Epidural Steroid injection (CESI) (Interlaminar) #1  Laterality: Left  Level: C7-T1 Imaging: Fluoroscopy-assisted DOS: 11/26/2022  Performed by: Milinda Pointer, MD Anesthesia: Local anesthesia (1-2% Lidocaine) Anxiolysis: None                 Sedation:                           Purpose: Diagnostic/Therapeutic Indications: Cervicalgia, cervical radicular pain, degenerative disc disease, severe enough to impact quality of life or function. No diagnosis found. NAS-11 score:   Pre-procedure:  /10   Post-procedure:  /10      Position  Prep  Materials:  Location setting: Procedure suite Position: Prone, on modified reverse trendelenburg to facilitate breathing, with head in head-cradle. Pillows positioned under chest (below chin-level) with cervical spine flexed. Safety Precautions: Patient was assessed for positional comfort and pressure points before starting the procedure. Prepping solution: DuraPrep (Iodine Povacrylex [0.7% available iodine] and Isopropyl Alcohol, 74% w/w) Prep Area: Entire  cervicothoracic region Approach: percutaneous, paramedial Intended target: Posterior cervical  epidural space Materials Procedure:  Tray: Epidural Needle(s): Epidural (Tuohy) Qty: 1 Length: (16m) 3.5-inch Gauge: 17G   Pre-op H&P Assessment:  [name] a 72y.o. (year old), female patient, seen today for interventional treatment. She  has a past surgical history that includes Joint replacement (Right); Joint replacement; Cholecystectomy; Abdominal hysterectomy; Shoulder arthroscopy with rotator cuff repair and subacromial decompression (Right, 06/14/2015); Esophagogastroduodenoscopy (egd) with propofol (N/A, 10/11/2015); Savory dilation (N/A, 10/11/2015); Colonoscopy w/ polypectomy; Esophagogastroduodenoscopy (egd) with propofol (N/A, 12/10/2017); Colonoscopy with propofol (N/A, 12/10/2017); Total knee arthroplasty (Left, 03/06/2021); Joint replacement (Left); Esophagogastroduodenoscopy (egd) with propofol (N/A, 05/03/2022); and Breast surgery. [name] a current medication list which includes the following prescription(s): aciphex, alprazolam, fluticasone, hydrochlorothiazide, lidocaine, losartan, meloxicam, ondansetron, paroxetine, potassium chloride, prednisone, promethazine, tirzepatide, tizanidine, and trazodone. Her primarily concern today is the No chief complaint on file.  Initial Vital Signs:  Pulse/HCG Rate:    Temp:   Resp:   BP:   SpO2:    BMI: Estimated body mass index is 46.94 kg/m as calculated from the following:   Height as of 11/06/22: 5' 3"$  (1.6 m).   Weight as of 11/06/22: 265 lb (120.2 kg).  Risk Assessment: Allergies: Reviewed. She has No Known Allergies.  Allergy Precautions: None required Coagulopathies: Reviewed. None identified.  Blood-thinner therapy: None at this time Active Infection(s): Reviewed. None identified. [name] afebrile  Site Confirmation: [name] asked to confirm the procedure and laterality before marking the site Procedure checklist: Completed Consent: Before the procedure and under the influence of no  sedative(s), amnesic(s), or anxiolytics, the patient was informed of the treatment options, risks and possible complications. To fulfill our ethical and legal obligations, as recommended by the American Medical Association's Code of Ethics, I have informed the patient  of my clinical impression; the nature and purpose of the treatment or procedure; the risks, benefits, and possible complications of the intervention; the alternatives, including doing nothing; the risk(s) and benefit(s) of the alternative treatment(s) or procedure(s); and the risk(s) and benefit(s) of doing nothing. The patient was provided information about the general risks and possible complications associated with the procedure. These may include, but are not limited to: failure to achieve desired goals, infection, bleeding, organ or nerve damage, allergic reactions, paralysis, and death. In addition, the patient was informed of those risks and complications associated to Spine-related procedures, such as failure to decrease pain; infection (i.e.: Meningitis, epidural or intraspinal abscess); bleeding (i.e.: epidural hematoma, subarachnoid hemorrhage, or any other type of intraspinal or peri-dural bleeding); organ or nerve damage (i.e.: Any type of peripheral nerve, nerve root, or spinal cord injury) with subsequent damage to sensory, motor, and/or autonomic systems, resulting in permanent pain, numbness, and/or weakness of one or several areas of the body; allergic reactions; (i.e.: anaphylactic reaction); and/or death. Furthermore, the patient was informed of those risks and complications associated with the medications. These include, but are not limited to: allergic reactions (i.e.: anaphylactic or anaphylactoid reaction(s)); adrenal axis suppression; blood sugar elevation that in diabetics may result in ketoacidosis or comma; water retention that in patients with history of congestive heart failure may result in shortness of breath,  pulmonary edema, and decompensation with resultant heart failure; weight gain; swelling or edema; medication-induced neural toxicity; particulate matter embolism and blood vessel occlusion with resultant organ, and/or nervous system infarction; and/or aseptic necrosis of one or more joints. Finally, the patient was informed that Medicine is not an exact science; therefore, there is also the possibility of unforeseen or unpredictable risks and/or possible complications that may result in a catastrophic outcome. The patient indicated having understood very clearly. We have given the patient no guarantees and we have made no promises. Enough time was given to the patient to ask questions, all of which were answered to the patient's satisfaction. Ms. Jantz has indicated that she wanted to continue with the procedure. Attestation: I, the ordering provider, attest that I have discussed with the patient the benefits, risks, side-effects, alternatives, likelihood of achieving goals, and potential problems during recovery for the procedure that I have provided informed consent. Date  Time: {CHL [facility]-PAIN TIME CHOICES:[id number]}   Pre-Procedure Preparation:  Monitoring: As per clinic protocol. Respiration, ETCO2, SpO2, BP, heart rate and rhythm monitor placed and checked for adequate function Safety Precautions: Patient was assessed for positional comfort and pressure points before starting the procedure. Time-out: I initiated and conducted the "Time-out" before starting the procedure, as per protocol. The patient was asked to participate by confirming the accuracy of the "Time Out" information. Verification of the correct person, site, and procedure were performed and confirmed by me, the nursing staff, and the patient. "Time-out" conducted as per Joint Commission's Universal Protocol (UP.01.01.01). Time:    Description  Narrative of Procedure:          Start Time:   hrs. Rationale (medical necessity):  procedure needed and proper for the diagnosis and/or treatment of the patient's medical symptoms and needs. Safety Precautions: Aspiration looking for blood return was conducted prior to all injections. At no point did we inject any substances, as a needle was being advanced. No attempts were made at seeking any paresthesias. Safe injection practices and needle disposal techniques used. Medications properly checked for expiration dates. SDV (single dose vial) medications used. Description of procedure:  Protocol guidelines were followed. The patient was assisted into a comfortable position. The target area was identified and the area prepped in the usual manner. Skin & deeper tissues infiltrated with local anesthetic. Appropriate amount of time allowed to pass for local anesthetics to take effect. Using fluoroscopic guidance, the epidural needle was introduced through the skin, ipsilateral to the reported pain, and advanced to the target area. Posterior laminar os was contacted and the needle walked caudad, until the lamina was cleared. The ligamentum flavum was engaged and the epidural space identified using "loss-of-resistance technique" with 2-3 ml of PF-NaCl (0.9% NSS), in a 5cc dedicated LOR syringe. (See "Imaging guidance" below for use of contrast details.) Once proper needle placement was secured, and negative aspiration confirmed, the solution was injected in intermittent fashion, asking for systemic symptoms every 0.5cc. The needles were then removed and the area cleansed, making sure to leave some of the prepping solution back to take advantage of its long term bactericidal properties.  There were no vitals filed for this visit.   End Time:   hrs.  Imaging Guidance (Spinal):          Type of Imaging Technique: Fluoroscopy Guidance (Spinal) Indication(s): Assistance in needle guidance and placement for procedures requiring needle placement in or near specific anatomical locations not easily  accessible without such assistance. Exposure Time: Please see nurses notes. Contrast: Before injecting any contrast, we confirmed that the patient did not have an allergy to iodine, shellfish, or radiological contrast. Once satisfactory needle placement was completed at the desired level, radiological contrast was injected. Contrast injected under live fluoroscopy. No contrast complications. See chart for type and volume of contrast used. Fluoroscopic Guidance: I was personally present during the use of fluoroscopy. "Tunnel Vision Technique" used to obtain the best possible view of the target area. Parallax error corrected before commencing the procedure. "Direction-depth-direction" technique used to introduce the needle under continuous pulsed fluoroscopy. Once target was reached, antero-posterior, oblique, and lateral fluoroscopic projection used confirm needle placement in all planes. Images permanently stored in EMR. Interpretation: I personally interpreted the imaging intraoperatively. Adequate needle placement confirmed in multiple planes. Appropriate spread of contrast into desired area was observed. No evidence of afferent or efferent intravascular uptake. No intrathecal or subarachnoid spread observed. Permanent images saved into the patient's record.  Post-operative Assessment:  Post-procedure Vital Signs:  Pulse/HCG Rate:    Temp:   Resp:   BP:   SpO2:    EBL: None  Complications: No immediate post-treatment complications observed by team, or reported by patient.  Note: The patient tolerated the entire procedure well. A repeat set of vitals were taken after the procedure and the patient was kept under observation following institutional policy, for this type of procedure. Post-procedural neurological assessment was performed, showing return to baseline, prior to discharge. The patient was provided with post-procedure discharge instructions, including a section on how to identify  potential problems. Should any problems arise concerning this procedure, the patient was given instructions to immediately contact us, at any time, without hesitation. In any case, we plan to contact the patient by telephone for a follow-up status report regarding this interventional procedure.  Comments:  No additional relevant information.  Plan of Care (POC)  Orders:  No orders of the defined types were placed in this encounter.  Chronic Opioid Analgesic:  No chronic opioid analgesics therapy prescribed by our practice. Tramadol 50 mg tablet, 1 tab p.o. twice daily (PRN) MME/day: 0-10 mg/day   Medications ordered  for procedure: No orders of the defined types were placed in this encounter.  Medications administered: Neil Benevides. Ferris had no medications administered during this visit.  See the medical record for exact dosing, route, and time of administration.  Follow-up plan:   No follow-ups on file.       Interventional Therapies  Risk Factors  Considerations:  Cerebrovascular malformation  GERD  HTN MO  OSA  SOB No RFA until BMI is below 35 kg/m.   Planned  Pending:   Left CESI   Under consideration:      Completed:   Therapeutic/palliative bilateral lumbar facet MBB x3 (08/30/2021) (100/100/100/0) pain worsened after injection. Therapeutic right L4-5 LESI x1 (07/27/2020)  Referral to bariatric surgery and medical weight management entered on 07/18/2020.    Completed by other providers:   EEG x2 by Dr. Jennings Books (07/03/14, 07/29/14) - WNL  Diagnostic left occipital nerve block x1 (08/10/2014) by Dr. Jennings Books [facility] neurology)  Therapeutic right rotator cuff repair x1 (06/14/2015) by Franchot Mimes MD [facility] orthopedics)  Therapeutic left IA Synvisc knee injection x2 (10/02/2020) by Janus Molder, PA [facility] orthopedics and PMR) Therapeutic left total knee replacement x1 (03/06/2021) by Dr. Hessie Knows [facility] orthopedics)   Therapeutic  Palliative (PRN) options:    Palliative bilateral lumbar facet MBB #4  Therapeutic right L4-5 LESI #2        Recent Visits Date Type Provider Dept  11/06/22 Office Visit Milinda Pointer, MD [facility]  Showing recent visits within past 90 days and meeting all other requirements Future Appointments Date Type Provider Dept  11/26/22 Appointment Milinda Pointer, MD [facility]  Showing future appointments within next 90 days and meeting all other requirements  Disposition: Discharge home  Discharge (Date  Time): 11/26/2022;   hrs.   Primary Care Physician: Burnard Hawthorne, FNP Location: [facility] Note by: Gaspar Cola, MD Date: 11/26/2022; Time: 11:28 AM  Disclaimer:  Medicine is not an Chief Strategy Officer. The only guarantee in medicine is that nothing is guaranteed. It is important to note that the decision to proceed with this intervention was based on the information collected from the patient. The Data and conclusions were drawn from the patient's questionnaire, the interview, and the physical examination. Because the information was provided in large part by the patient, it cannot be guaranteed that it has not been purposely or unconsciously manipulated. Every effort has been made to obtain as much relevant data as possible for this evaluation. It is important to note that the conclusions that lead to this procedure are derived in large part from the available data. Always take into account that the treatment will also be dependent on availability of resources and existing treatment guidelines, considered by other Pain Management Practitioners as being common knowledge and practice, at the time of the intervention. For Medico-Legal purposes, it is also important to point out that variation in procedural techniques and pharmacological choices are the acceptable norm. The indications, contraindications, technique, and results of the above procedure should  only be interpreted and judged by a Board-Certified Interventional Pain Specialist with extensive familiarity and expertise in the same exact procedure and technique.

## 2022-11-26 ENCOUNTER — Ambulatory Visit
Admission: RE | Admit: 2022-11-26 | Discharge: 2022-11-26 | Disposition: A | Discharge status: Home / Self Care | Payer: 59 | Source: Ambulatory Visit | Attending: Pain Medicine | Admitting: Pain Medicine

## 2022-11-26 ENCOUNTER — Encounter: Payer: Self-pay | Admitting: Pain Medicine

## 2022-11-26 ENCOUNTER — Ambulatory Visit: Payer: 59 | Attending: Pain Medicine | Admitting: Pain Medicine

## 2022-11-26 VITALS — BP 112/72 | HR 103 | Temp 98.7°F | Resp 17 | Ht 63.0 in | Wt 260.0 lb

## 2022-11-26 DIAGNOSIS — R937 Abnormal findings on diagnostic imaging of other parts of musculoskeletal system: Secondary | ICD-10-CM | POA: Insufficient documentation

## 2022-11-26 DIAGNOSIS — M25512 Pain in left shoulder: Secondary | ICD-10-CM | POA: Insufficient documentation

## 2022-11-26 DIAGNOSIS — M4802 Spinal stenosis, cervical region: Secondary | ICD-10-CM | POA: Insufficient documentation

## 2022-11-26 DIAGNOSIS — M5030 Other cervical disc degeneration, unspecified cervical region: Secondary | ICD-10-CM | POA: Diagnosis not present

## 2022-11-26 DIAGNOSIS — M542 Cervicalgia: Secondary | ICD-10-CM | POA: Diagnosis not present

## 2022-11-26 DIAGNOSIS — M5412 Radiculopathy, cervical region: Secondary | ICD-10-CM | POA: Diagnosis not present

## 2022-11-26 MED ORDER — ROPIVACAINE HCL 2 MG/ML IJ SOLN
INTRAMUSCULAR | Status: AC
  Filled 2022-11-26: qty 20

## 2022-11-26 MED ORDER — DEXAMETHASONE SODIUM PHOSPHATE 10 MG/ML IJ SOLN
10.0000 mg | Freq: Once | INTRAMUSCULAR | Status: AC
  Administered 2022-11-26: 10 mg

## 2022-11-26 MED ORDER — IOHEXOL 180 MG/ML  SOLN
10.0000 mL | Freq: Once | INTRAMUSCULAR | Status: AC
  Administered 2022-11-26: 10 mL via EPIDURAL

## 2022-11-26 MED ORDER — MIDAZOLAM HCL 5 MG/5ML IJ SOLN
INTRAMUSCULAR | Status: AC
  Filled 2022-11-26: qty 5

## 2022-11-26 MED ORDER — LIDOCAINE HCL 2 % IJ SOLN
20.0000 mL | Freq: Once | INTRAMUSCULAR | Status: AC
  Administered 2022-11-26: 100 mg

## 2022-11-26 MED ORDER — SODIUM CHLORIDE (PF) 0.9 % IJ SOLN
INTRAMUSCULAR | Status: AC
  Filled 2022-11-26: qty 10

## 2022-11-26 MED ORDER — PENTAFLUOROPROP-TETRAFLUOROETH EX AERO
INHALATION_SPRAY | Freq: Once | CUTANEOUS | Status: AC
  Administered 2022-11-26: 30 via TOPICAL

## 2022-11-26 MED ORDER — IOHEXOL 180 MG/ML  SOLN
INTRAMUSCULAR | Status: AC
  Filled 2022-11-26: qty 10

## 2022-11-26 MED ORDER — MIDAZOLAM HCL 5 MG/5ML IJ SOLN
0.5000 mg | Freq: Once | INTRAMUSCULAR | Status: AC
  Administered 2022-11-26: 2 mg via INTRAVENOUS

## 2022-11-26 MED ORDER — LACTATED RINGERS IV SOLN: Freq: Once | INTRAVENOUS | Status: AC

## 2022-11-26 MED ORDER — FENTANYL CITRATE (PF) 100 MCG/2ML IJ SOLN
25.0000 ug | INTRAMUSCULAR | Status: DC | PRN
  Administered 2022-11-26: 50 ug via INTRAVENOUS

## 2022-11-26 MED ORDER — LIDOCAINE HCL (PF) 2 % IJ SOLN
INTRAMUSCULAR | Status: AC
  Filled 2022-11-26: qty 10

## 2022-11-26 MED ORDER — SODIUM CHLORIDE 0.9% FLUSH
1.0000 mL | Freq: Once | INTRAVENOUS | Status: AC
  Administered 2022-11-26: 1 mL

## 2022-11-26 MED ORDER — FENTANYL CITRATE (PF) 100 MCG/2ML IJ SOLN
INTRAMUSCULAR | Status: AC
  Filled 2022-11-26: qty 2

## 2022-11-26 MED ORDER — DEXAMETHASONE SODIUM PHOSPHATE 10 MG/ML IJ SOLN
INTRAMUSCULAR | Status: AC
  Filled 2022-11-26: qty 1

## 2022-11-26 MED ORDER — ROPIVACAINE HCL 2 MG/ML IJ SOLN
1.0000 mL | Freq: Once | INTRAMUSCULAR | Status: AC
  Administered 2022-11-26: 1 mL via EPIDURAL

## 2022-11-26 NOTE — Patient Instructions (Signed)
____________________________________________________________________________________________  Post-Procedure Discharge Instructions  Instructions: Apply ice:  Purpose: This will minimize any swelling and discomfort after procedure.  When: Day of procedure, as soon as you get home. How: Fill a plastic sandwich bag with crushed ice. Cover it with a small towel and apply to injection site. How long: (15 min on, 15 min off) Apply for 15 minutes then remove x 15 minutes.  Repeat sequence on day of procedure, until you go to bed. Apply heat:  Purpose: To treat any soreness and discomfort from the procedure. When: Starting the next day after the procedure. How: Apply heat to procedure site starting the day following the procedure. How long: May continue to repeat daily, until discomfort goes away. Food intake: Start with clear liquids (like water) and advance to regular food, as tolerated.  Physical activities: Keep activities to a minimum for the first 8 hours after the procedure. After that, then as tolerated. Driving: If you have received any sedation, be responsible and do not drive. You are not allowed to drive for 24 hours after having sedation. Blood thinner: (Applies only to those taking blood thinners) You may restart your blood thinner 6 hours after your procedure. Insulin: (Applies only to Diabetic patients taking insulin) As soon as you can eat, you may resume your normal dosing schedule. Infection prevention: Keep procedure site clean and dry. Shower daily and clean area with soap and water. Post-procedure Pain Diary: Extremely important that this be done correctly and accurately. Recorded information will be used to determine the next step in treatment. For the purpose of accuracy, follow these rules: Evaluate only the area treated. Do not report or include pain from an untreated area. For the purpose of this evaluation, ignore all other areas of pain, except for the treated area. After  your procedure, avoid taking a long nap and attempting to complete the pain diary after you wake up. Instead, set your alarm clock to go off every hour, on the hour, for the initial 8 hours after the procedure. Document the duration of the numbing medicine, and the relief you are getting from it. Do not go to sleep and attempt to complete it later. It will not be accurate. If you received sedation, it is likely that you were given a medication that may cause amnesia. Because of this, completing the diary at a later time may cause the information to be inaccurate. This information is needed to plan your care. Follow-up appointment: Keep your post-procedure follow-up evaluation appointment after the procedure (usually 2 weeks for most procedures, 6 weeks for radiofrequencies). DO NOT FORGET to bring you pain diary with you.   Expect: (What should I expect to see with my procedure?) From numbing medicine (AKA: Local Anesthetics): Numbness or decrease in pain. You may also experience some weakness, which if present, could last for the duration of the local anesthetic. Onset: Full effect within 15 minutes of injected. Duration: It will depend on the type of local anesthetic used. On the average, 1 to 8 hours.  From steroids (Applies only if steroids were used): Decrease in swelling or inflammation. Once inflammation is improved, relief of the pain will follow. Onset of benefits: Depends on the amount of swelling present. The more swelling, the longer it will take for the benefits to be seen. In some cases, up to 10 days. Duration: Steroids will stay in the system x 2 weeks. Duration of benefits will depend on multiple posibilities including persistent irritating factors. Side-effects: If present, they   may typically last 2 weeks (the duration of the steroids). Frequent: Cramps (if they occur, drink Gatorade and take over-the-counter Magnesium 450-500 mg once to twice a day); water retention with temporary  weight gain; increases in blood sugar; decreased immune system response; increased appetite. Occasional: Facial flushing (red, warm cheeks); mood swings; menstrual changes. Uncommon: Long-term decrease or suppression of natural hormones; bone thinning. (These are more common with higher doses or more frequent use. This is why we prefer that our patients avoid having any injection therapies in other practices.)  Very Rare: Severe mood changes; psychosis; aseptic necrosis. From procedure: Some discomfort is to be expected once the numbing medicine wears off. This should be minimal if ice and heat are applied as instructed.  Call if: (When should I call?) You experience numbness and weakness that gets worse with time, as opposed to wearing off. New onset bowel or bladder incontinence. (Applies only to procedures done in the spine)  Emergency Numbers: Durning business hours (Monday - Thursday, 8:00 AM - 4:00 PM) (Friday, 9:00 AM - 12:00 Noon): [phone] After hours: [phone] NOTE: If you are having a problem and are unable connect with, or to talk to a provider, then go to your nearest urgent care or emergency department. If the problem is serious and urgent, please call 911. ____________________________________________________________________________________________  ____________________________________________________________________________________________  Patient Information update  To: All of our patients.  Re: Name change.  It has been made official that our current name, "[facility]"   will soon be changed to "[facility]".   The purpose of this change is to eliminate any confusion created by the concept of our practice being a "Medication Management Pain Clinic". In the past this has led to the misconception that we treat pain primarily by the use of prescription  medications.  Nothing can be farther from the truth.   Understanding PAIN MANAGEMENT: To further understand what our practice does, you first have to understand that "Pain Management" is a subspecialty that requires additional training once a physician has completed their specialty training, which can be in either Anesthesia, Neurology, Psychiatry, or Physical Medicine and Rehabilitation (PMR). Each one of these contributes to the final approach taken by each physician to the management of their patient's pain. To be a "Pain Management Specialist" you must have first completed one of the specialty trainings below.  Anesthesiologists - trained in clinical pharmacology and interventional techniques such as nerve blockade and regional as well as central neuroanatomy. They are trained to block pain before, during, and after surgical interventions.  Neurologists - trained in the diagnosis and pharmacological treatment of complex neurological conditions, such as Multiple Sclerosis, Parkinson's, spinal cord injuries, and other systemic conditions that may be associated with symptoms that may include but are not limited to pain. They tend to rely primarily on the treatment of chronic pain using prescription medications.  Psychiatrist - trained in conditions affecting the psychosocial wellbeing of patients including but not limited to depression, anxiety, schizophrenia, personality disorders, addiction, and other substance use disorders that may be associated with chronic pain. They tend to rely primarily on the treatment of chronic pain using prescription medications.   Physical Medicine and Rehabilitation (PMR) physicians, also known as physiatrists - trained to treat a wide variety of medical conditions affecting the brain, spinal cord, nerves, bones, joints, ligaments, muscles, and tendons. Their training is primarily aimed at treating patients that have suffered injuries   that have caused severe physical  impairment. Their training is primarily aimed at the physical therapy and rehabilitation of those patients. They may also work alongside orthopedic surgeons or neurosurgeons using their expertise in assisting surgical patients to recover after their surgeries.  INTERVENTIONAL PAIN MANAGEMENT is sub-subspecialty of Pain Management.  Our physicians are Board-certified in Anesthesia, Pain Management, and Interventional Pain Management.  This meaning that not only have they been trained and Board-certified in their specialty of Anesthesia, and subspecialty of Pain Management, but they have also received further training in the sub-subspecialty of Interventional Pain Management, in order to become Board-certified as INTERVENTIONAL PAIN MANAGEMENT SPECIALIST.    Mission: Our goal is to use our skills in  INTERVENTIONAL PAIN MANAGEMENT as alternatives to the chronic use of prescription opioid medications for the treatment of pain. To make this more clear, we have changed our name to reflect what we do and offer. We will continue to offer medication management assessment and recommendations, but we will not be taking over any patient's medication management.  ____________________________________________________________________________________________   

## 2022-11-27 ENCOUNTER — Telehealth: Payer: Self-pay

## 2022-11-27 NOTE — Telephone Encounter (Signed)
Post procedure follow up.  LM 

## 2022-12-12 ENCOUNTER — Encounter: Payer: Self-pay | Admitting: Pain Medicine

## 2022-12-12 ENCOUNTER — Ambulatory Visit: Payer: 59 | Attending: Pain Medicine | Admitting: Pain Medicine

## 2022-12-12 VITALS — BP 118/77 | HR 99 | Temp 96.9°F | Resp 16 | Ht 63.0 in | Wt 260.0 lb

## 2022-12-12 DIAGNOSIS — M4802 Spinal stenosis, cervical region: Secondary | ICD-10-CM | POA: Insufficient documentation

## 2022-12-12 DIAGNOSIS — R937 Abnormal findings on diagnostic imaging of other parts of musculoskeletal system: Secondary | ICD-10-CM | POA: Diagnosis not present

## 2022-12-12 DIAGNOSIS — G8929 Other chronic pain: Secondary | ICD-10-CM | POA: Diagnosis not present

## 2022-12-12 DIAGNOSIS — M5412 Radiculopathy, cervical region: Secondary | ICD-10-CM | POA: Diagnosis not present

## 2022-12-12 DIAGNOSIS — M5030 Other cervical disc degeneration, unspecified cervical region: Secondary | ICD-10-CM | POA: Insufficient documentation

## 2022-12-12 DIAGNOSIS — M25512 Pain in left shoulder: Secondary | ICD-10-CM | POA: Diagnosis not present

## 2022-12-12 DIAGNOSIS — M542 Cervicalgia: Secondary | ICD-10-CM | POA: Insufficient documentation

## 2022-12-12 DIAGNOSIS — M19012 Primary osteoarthritis, left shoulder: Secondary | ICD-10-CM | POA: Insufficient documentation

## 2022-12-12 NOTE — Progress Notes (Signed)
PROVIDER NOTE: Information contained herein reflects review and annotations entered in association with encounter. Interpretation of such information and data should be left to medically-trained personnel. Information provided to patient can be located elsewhere in the medical record under "Patient Instructions". Document created using STT-dictation technology, any transcriptional errors that may result from process are unintentional.    Patient: Jacqueline Delgado  Service Category: E/M  Provider: Gaspar Cola, MD  DOB: 02-Jun-1951  DOS: 12/12/2022  Referring Provider: Burnard Hawthorne, FNP  MRN: [id number]  Specialty: Interventional Pain Management  PCP: Burnard Hawthorne, FNP  Type: Established Patient  Setting: Ambulatory outpatient    Location: Office  Delivery: Face-to-face     HPI  [name], a 72 y.o. year old female, is here today because of her Left cervical radiculopathy [M54.12]. [name] primary complain today is Shoulder Pain (left), Arm Pain (left), and Neck Pain (left) Last encounter: My last encounter with her was on 11/26/2022. Pertinent problems: [name] has Plantar fasciitis; Localized swelling of finger of left hand; Acquired trigger finger; Closed nondisplaced fracture of base of fifth metacarpal bone of right hand; Complete tear of right rotator cuff; DDD (degenerative disc disease), cervical; DDD (degenerative disc disease), lumbar; Chronic low back pain (1ry area of Pain) (Bilateral) (R>L) w/ sciatica (Right); Neck pain; Non-cardiac chest pain; S/P rotator cuff repair; Osteoarthritis, knee; Unspecified osteoarthritis, unspecified site; Osteoarthritis of knee (Left); Ankle swelling, right; Chronic pain syndrome; DDD (degenerative disc disease), lumbosacral; Lumbar facet hypertrophy; Abnormal MRI, lumbar spine (08/11/20); Lumbosacral central spinal stenosis (9 mm) (L4-5 and L5-S1); Chronic lower extremity pain (Right); Lumbosacral radiculopathy at L5 (Right);  Chronic knee pain (3ry area of Pain) (Left); Swelling of foot (4th area of Pain) (Right); Chronic lower extremity pain (2ry area of Pain) (Bilateral) (R>L); Calcaneal spur of foot (Right); Lumbar Grade 1 Anterolisthesis of L4/L5; Lumbosacral facet syndrome (Bilateral) (R>L); Chronic low back pain (Bilateral) (R>L) w/o sciatica; Spondylosis without myelopathy or radiculopathy, lumbosacral region; S/P TKR (total knee replacement) using cement, left; Chronic knee pain after total replacement of knee joint (Left); Osteoarthritis of lumbar spine; Osteoarthritis involving multiple joints; Acute midline low back pain without sciatica; Left shoulder pain; Abnormal MRI, cervical spine (07/25/2022); Cervicalgia; Cervical foraminal stenosis (Severe, Left: C3-4, C4-5) (Bilateral: C5-6, C6-7); Radicular pain of shoulder (Left); Cervical radiculopathy (Left) (C4, C5); Osteoarthritis of shoulder (Left); and Osteoarthritis of AC (acromioclavicular) joint (Left) on their pertinent problem list. Pain Assessment: Severity of Chronic pain is reported as a 4 /10. Location: Shoulder Left/radiates down left arm, hands go numb. Onset: More than a month ago. Quality: Aching, Throbbing. Timing: Constant. Modifying factor(s): procedures. Vitals:  height is '5\' 3"'$  (1.6 m) and weight is 260 lb (117.9 kg). Her temperature is 96.9 F (36.1 C) (abnormal). Her blood pressure is 118/77 and her pulse is 99. Her respiration is 16 and oxygen saturation is 97%.  BMI: Estimated body mass index is 46.06 kg/m as calculated from the following:   Height as of this encounter: '5\' 3"'$  (1.6 m).   Weight as of this encounter: 260 lb (117.9 kg).  Reason for encounter: post-procedure evaluation and assessment.  The patient returns to the clinic today after having had a left cervical epidural steroid injection under fluoroscopic guidance.  This has significantly improved the patient's neck pain, range of motion, and some of the pain in the left shoulder  region.  However, the left shoulder pain seems to be multifactorial.  MRIs of the cervical spine and  the left shoulder do show the patient to have problems with the shoulder itself.  Today the patient indicates having attained some improvement in her left shoulder range of motion, which is still limited, but this limitation seems to be due to guarding as opposed to an actual problem with the rotator cuff.  The patient has left shoulder MRI done on 07/25/2022 indicates that the rotator cuff seems to be intact.  There is some mild rotator cuff tendinosis with mild glenohumeral and moderate acromioclavicular osteoarthritis.  In fact, today the patient indicates the worst pain to be right over the acromioclavicular joint.  In view of these findings, we have proposed to the patient to do a left intra-articular shoulder injection into the area of the glenohumeral and acromioclavicular joints.  The plan was shared with the patient who understood and accepted.  Post-procedure evaluation   Procedure: Cervical Epidural Steroid injection (CESI) (Interlaminar) #1  Laterality: Left  Level: C7-T1 Imaging: Fluoroscopy-assisted DOS: 11/26/2022  Performed by: Milinda Pointer, MD Anesthesia: Local anesthesia (1-2% Lidocaine) Anxiolysis: IV Versed 2.0 mg Sedation: Moderate Sedation Fentanyl 1 mL (50 mcg)   Purpose: Diagnostic/Therapeutic Indications: Cervicalgia, cervical radicular pain, degenerative disc disease, severe enough to impact quality of life or function. 1. Cervical radiculopathy (Left) (C4, C5)   2. Cervical foraminal stenosis (Severe, Left: C3-4, C4-5) (Bilateral: C5-6, C6-7)   3. Cervicalgia   4. DDD (degenerative disc disease), cervical   5. Radicular pain of shoulder (Left)   6. Left shoulder pain, unspecified chronicity    NAS-11 score:   Pre-procedure: 9 /10   Post-procedure: 1 /10      Effectiveness:  Initial hour after procedure: 75 %. Subsequent 4-6 hours post-procedure: 75  %. Analgesia past initial 6 hours: 50 % (ongoing). Ongoing improvement:  Analgesic: The patient indicates having attained an ongoing 100% relief of the pain in the left cervical (neck) area but only a 50% ongoing relief of the left shoulder pain. Function: Somewhat improved (more the neck than the shoulder) ROM: Somewhat improved (more the neck than the shoulder)  Pharmacotherapy Assessment  Analgesic: No chronic opioid analgesics therapy prescribed by our practice. Tramadol 50 mg tablet, 1 tab p.o. twice daily (PRN) MME/day: 0-10 mg/day   Monitoring: [place] PMP: PDMP not reviewed this encounter.       Pharmacotherapy: No side-effects or adverse reactions reported. Compliance: No problems identified. Effectiveness: Clinically acceptable.  Dewayne Shorter, RN  12/12/2022  1:35 PM  Sign when Signing Visit Safety precautions to be maintained throughout the outpatient stay will include: orient to surroundings, keep bed in low position, maintain call bell within reach at all times, provide assistance with transfer out of bed and ambulation.    No results found for: "CBDTHCR" No results found for: "D8THCCBX" No results found for: "D9THCCBX"  UDS:  Summary  Date Value Ref Range Status  05/22/2020 Note  Final    Comment:    ==================================================================== Compliance Drug Analysis, Ur ==================================================================== Test                             Result       Flag       Units  Drug Present and Declared for Prescription Verification   Tramadol                       >3268        EXPECTED   ng/mg creat   O-Desmethyltramadol  2372         EXPECTED   ng/mg creat   N-Desmethyltramadol            1509         EXPECTED   ng/mg creat    Source of tramadol is a prescription medication. O-desmethyltramadol    and N-desmethyltramadol are expected metabolites of tramadol.    Bupropion                      PRESENT       EXPECTED   Hydroxybupropion               PRESENT      EXPECTED    Hydroxybupropion is an expected metabolite of bupropion.  Drug Present not Declared for Prescription Verification   Dextromethorphan               PRESENT      UNEXPECTED   Dextrorphan/Levorphanol        PRESENT      UNEXPECTED    Dextrorphan is an expected metabolite of dextromethorphan, an over-    the-counter or prescription cough suppressant. Dextrorphan cannot be    distinguished from the scheduled prescription medication levorphanol    by the method used for analysis.  ==================================================================== Test                      Result    Flag   Units      Ref Range   Creatinine              153              mg/dL      >=20 ==================================================================== Declared Medications:  The flagging and interpretation on this report are based on the  following declared medications.  Unexpected results may arise from  inaccuracies in the declared medications.   **Note: The testing scope of this panel includes these medications:   Bupropion (Wellbutrin)  Tramadol (Ultram)   **Note: The testing scope of this panel does not include the  following reported medications:   Famotidine (Pepcid)  Hydrochlorothiazide (Hydrodiuril)  Losartan (Cozaar)  Meloxicam (Mobic)  Mirabegron (Myrbetriq)  Rabeprazole (Aciphex) ==================================================================== For clinical consultation, please call [phone]. ====================================================================       ROS  Constitutional: Denies any fever or chills Gastrointestinal: No reported hemesis, hematochezia, vomiting, or acute GI distress Musculoskeletal: Denies any acute onset joint swelling, redness, loss of ROM, or weakness Neurological: No reported episodes of acute onset apraxia, aphasia, dysarthria, agnosia, amnesia, paralysis, loss of  coordination, or loss of consciousness  Medication Review  ALPRAZolam, PARoxetine, RABEprazole, famotidine, fluticasone, hydrochlorothiazide, lidocaine, losartan, meloxicam, ondansetron, potassium chloride, predniSONE, promethazine, tiZANidine, tirzepatide, and traZODone  History Review  Allergy: Ms. Ransier has No Known Allergies. Drug: Ms. Vanatta  reports no history of drug use. Alcohol:  reports no history of alcohol use. Tobacco:  reports that she has never smoked. She has never used smokeless tobacco. Social: Ms. Anstett  reports that she has never smoked. She has never used smokeless tobacco. She reports that she does not drink alcohol and does not use drugs. Medical:  has a past medical history of Anemia, Anginal pain ([place]), Arthritis, Colon polyp, GERD (gastroesophageal reflux disease), Hiatal hernia, History of hiatal hernia, History of methicillin resistant staphylococcus aureus (MRSA), Hypertension, Obesity, Pre-diabetes, Reflux, Sleep apnea, and Small vessel disease ([place]). Surgical: Ms. Pipkins  has a past surgical history that includes Joint replacement (  Right); Joint replacement; Cholecystectomy; Abdominal hysterectomy; Shoulder arthroscopy with rotator cuff repair and subacromial decompression (Right, 06/14/2015); Esophagogastroduodenoscopy (egd) with propofol (N/A, 10/11/2015); Savory dilation (N/A, 10/11/2015); Colonoscopy w/ polypectomy; Esophagogastroduodenoscopy (egd) with propofol (N/A, 12/10/2017); Colonoscopy with propofol (N/A, 12/10/2017); Total knee arthroplasty (Left, 03/06/2021); Joint replacement (Left); Esophagogastroduodenoscopy (egd) with propofol (N/A, 05/03/2022); and Breast surgery. Family: family history includes Alcoholism in an other family member; Arthritis in an other family member; Breast cancer in her maternal grandmother; Breast cancer (age of onset: 37) in her mother; Diabetes in an other family member; Heart disease in her father; Hyperlipidemia in her father;  Hypertension in her father; Stroke in her father.  Laboratory Chemistry Profile   Renal Lab Results  Component Value Date   BUN 16 09/13/2022   CREATININE 1.07 (H) 09/13/2022   BCR 20 01/19/2021   GFR 57.12 (L) 07/10/2022   GFRAA 81 09/13/2020   GFRNONAA 56 (L) 09/13/2022    Hepatic Lab Results  Component Value Date   AST 11 03/13/2022   ALT 7 03/13/2022   ALBUMIN 4.0 03/13/2022   ALKPHOS 97 03/13/2022   LIPASE 36 10/12/2017    Electrolytes Lab Results  Component Value Date   NA 143 09/13/2022   K 3.5 09/13/2022   CL 108 09/13/2022   CALCIUM 9.5 09/13/2022   MG 2.0 05/22/2020    Bone Lab Results  Component Value Date   VD25OH 16.47 (L) 10/21/2022   25OHVITD1 18 (L) 05/22/2020   25OHVITD2 13 05/22/2020   25OHVITD3 5.3 05/22/2020    Inflammation (CRP: Acute Phase) (ESR: Chronic Phase) Lab Results  Component Value Date   CRP 15 (H) 05/22/2020   ESRSEDRATE 73 (H) 05/22/2020         Note: Above Lab results reviewed.  Recent Imaging Review  DG PAIN CLINIC C-ARM 1-60 MIN NO REPORT Fluoro was used, but no Radiologist interpretation will be provided.  Please refer to "NOTES" tab for provider progress note. Note: Reviewed        Physical Exam  General appearance: Well nourished, well developed, and well hydrated. In no apparent acute distress Mental status: Alert, oriented x 3 (person, place, & time)       Respiratory: No evidence of acute respiratory distress Eyes: PERLA Vitals: BP 118/77   Pulse 99   Temp (!) 96.9 F (36.1 C)   Resp 16   Ht '5\' 3"'$  (1.6 m)   Wt 260 lb (117.9 kg)   SpO2 97%   BMI 46.06 kg/m  BMI: Estimated body mass index is 46.06 kg/m as calculated from the following:   Height as of this encounter: '5\' 3"'$  (1.6 m).   Weight as of this encounter: 260 lb (117.9 kg). Ideal: Ideal body weight: 52.4 kg (115 lb 8.3 oz) Adjusted ideal body weight: 78.6 kg (173 lb 5 oz)  Assessment   Diagnosis Status  1. Cervical radiculopathy (Left) (C4,  C5)   2. Cervicalgia   3. Radicular pain of shoulder (Left)   4. Abnormal MRI, cervical spine (07/25/2022)   5. Cervical foraminal stenosis (Severe, Left: C3-4, C4-5) (Bilateral: C5-6, C6-7)   6. DDD (degenerative disc disease), cervical   7. Chronic left shoulder pain   8. Osteoarthritis of shoulder (Left)   9. Osteoarthritis of AC (acromioclavicular) joint (Left)    Controlled Controlled Controlled   Updated Problems: Problem  Osteoarthritis of shoulder (Left)  Osteoarthritis of AC (acromioclavicular) joint (Left)    Plan of Care  Problem-specific:  No problem-specific Assessment & Plan notes  found for this encounter.  Ms. LAFAYE DENNINGER has a current medication list which includes the following long-term medication(s): aciphex, fluticasone, hydrochlorothiazide, losartan, paroxetine, potassium chloride, and trazodone.  Pharmacotherapy (Medications Ordered): No orders of the defined types were placed in this encounter.  Orders:  Orders Placed This Encounter  Procedures   SHOULDER INJECTION    Standing Status:   Future    Standing Expiration Date:   03/12/2023    Scheduling Instructions:     Procedure: Intra-articular shoulder (Glenohumeral) joint and (AC) Acromioclavicular joint injection     Side: Left-sided     Level: Glenohumeral joint and (AC) Acromioclavicular joint     Sedation: Patient's choice.     Timeframe: As permitted by the schedule    Order Specific Question:   Where will this procedure be performed?    Answer:   [facility]   Follow-up plan:   Return for (ECT): (L) IA Shoulder & AC #1.      Interventional Therapies  Risk Factors  Considerations:  Cerebrovascular malformation  GERD  HTN MO  OSA  SOB No RFA until BMI is below 35 kg/m.   Planned  Pending:   Diagnostic/therapeutic left IA glenohumeral + AC joint inj. #1    Under consideration:   Diagnostic/therapeutic left IA glenohumeral + AC joint inj. #1  Therapeutic left CESI #2     Completed:   Therapeutic left cervical ESI x1 (11/26/2022) (75/75/50/50) (Neck: 100  (L) shoulder: 50)  Therapeutic/palliative bilateral lumbar facet MBB x3 (08/30/2021) (100/100/100/0) pain worsened after injection. Therapeutic right L4-5 LESI x1 (07/27/2020)  Referral to bariatric surgery and medical weight management entered on 07/18/2020.    Completed by other providers:   EEG x2 by Dr. Jennings Books (07/03/14, 07/29/14) - WNL  Diagnostic left occipital nerve block x1 (08/10/2014) by Dr. Jennings Books [facility] neurology)  Therapeutic right rotator cuff repair x1 (06/14/2015) by Franchot Mimes MD [facility] orthopedics)  Therapeutic left IA Synvisc knee injection x2 (10/02/2020) by Janus Molder, PA [facility] orthopedics and PMR) Therapeutic left total knee replacement x1 (03/06/2021) by Dr. Hessie Knows [facility] orthopedics)   Therapeutic  Palliative (PRN) options:   Palliative bilateral lumbar facet MBB #4  Therapeutic right L4-5 LESI #2         Recent Visits Date Type Provider Dept  11/26/22 Procedure visit Milinda Pointer, MD [facility]  11/06/22 Office Visit Milinda Pointer, MD [facility]  Showing recent visits within past 90 days and meeting all other requirements Today's Visits Date Type Provider Dept  12/12/22 Office Visit Milinda Pointer, MD [facility]  Showing today's visits and meeting all other requirements Future Appointments No visits were found meeting these conditions. Showing future appointments within next 90 days and meeting all other requirements  I discussed the assessment and treatment plan with the patient. The patient was provided an opportunity to ask questions and all were answered. The patient agreed with the plan and demonstrated an understanding of the instructions.  Patient advised to call back or seek an in-person evaluation if the symptoms or condition worsens.  Duration of encounter: 38 minutes.  Total time on  encounter, as per AMA guidelines included both the face-to-face and non-face-to-face time personally spent by the physician and/or other qualified health care professional(s) on the day of the encounter (includes time in activities that require the physician or other qualified health care professional and does not include time in activities normally performed by clinical staff). Physician's time  may include the following activities when performed: Preparing to see the patient (e.g., pre-charting review of records, searching for previously ordered imaging, lab work, and nerve conduction tests) Review of prior analgesic pharmacotherapies. Reviewing PMP Interpreting ordered tests (e.g., lab work, imaging, nerve conduction tests) Performing post-procedure evaluations, including interpretation of diagnostic procedures Obtaining and/or reviewing separately obtained history Performing a medically appropriate examination and/or evaluation Counseling and educating the patient/family/caregiver Ordering medications, tests, or procedures Referring and communicating with other health care professionals (when not separately reported) Documenting clinical information in the electronic or other health record Independently interpreting results (not separately reported) and communicating results to the patient/ family/caregiver Care coordination (not separately reported)  Note by: Gaspar Cola, MD Date: 12/12/2022; Time: 3:05 PM

## 2022-12-12 NOTE — Progress Notes (Signed)
Safety precautions to be maintained throughout the outpatient stay will include: orient to surroundings, keep bed in low position, maintain call bell within reach at all times, provide assistance with transfer out of bed and ambulation.  

## 2022-12-12 NOTE — Patient Instructions (Signed)

## 2022-12-13 ENCOUNTER — Other Ambulatory Visit: Payer: Self-pay | Admitting: Family

## 2022-12-13 ENCOUNTER — Telehealth: Payer: Self-pay | Admitting: Family

## 2022-12-13 MED ORDER — TIRZEPATIDE 5 MG/0.5ML ~~LOC~~ SOAJ: 5.0000 mg | SUBCUTANEOUS | 2 refills | Status: DC

## 2022-12-13 NOTE — Telephone Encounter (Signed)
Patient called and would like an increase in her tirzepatide [facility]) 2.5 MG/0.5ML Pen, current dosage is not working.

## 2022-12-13 NOTE — Telephone Encounter (Signed)
S/w pt - completed 2.'5mg'$  dose

## 2023-01-01 ENCOUNTER — Encounter: Payer: Self-pay | Admitting: Family

## 2023-01-01 ENCOUNTER — Telehealth (INDEPENDENT_AMBULATORY_CARE_PROVIDER_SITE_OTHER): Payer: 59 | Admitting: Family

## 2023-01-01 DIAGNOSIS — Z8639 Personal history of other endocrine, nutritional and metabolic disease: Secondary | ICD-10-CM

## 2023-01-01 DIAGNOSIS — R053 Chronic cough: Secondary | ICD-10-CM

## 2023-01-01 DIAGNOSIS — E538 Deficiency of other specified B group vitamins: Secondary | ICD-10-CM

## 2023-01-01 DIAGNOSIS — N3281 Overactive bladder: Secondary | ICD-10-CM | POA: Diagnosis not present

## 2023-01-01 MED ORDER — AZITHROMYCIN 250 MG PO TABS: ORAL_TABLET | ORAL | 0 refills | Status: AC

## 2023-01-01 NOTE — Assessment & Plan Note (Signed)
Chronic.  Previously tried Symbicort.  Reflux seems to be well-controlled.  Pending chest x-ray, azithromycin.  If symptoms do not resolve will recommend referral to pulmonology.  Consider trial stop ARB.

## 2023-01-01 NOTE — Patient Instructions (Signed)
Please call the office to schedule labs, urine and chest x-ray  Please start azithromycin antibiotic.  Ensure to take probiotics while on antibiotics and also for 2 weeks after completion. This can either be by eating yogurt daily or taking a probiotic supplement over the counter such as Culturelle.It is important to re-colonize the gut with good bacteria and also to prevent any diarrheal infections associated with antibiotic use.

## 2023-01-01 NOTE — Assessment & Plan Note (Signed)
Symptoms controlled with oxybutynin 5 mg qpm. Symptom of dark urine.  Pending urinalysis, urine culture.

## 2023-01-01 NOTE — Progress Notes (Signed)
Virtual Visit via Video Note  I connected with Jacqueline Delgado on 01/01/23 at  3:30 PM EDT by a video enabled telemedicine application and verified that I am speaking with the correct person using two identifiers. Location patient: home Location provider: work  Persons participating in the virtual visit: patient, provider  I discussed the limitations of evaluation and management by telemedicine and the availability of in person appointments. The patient expressed understanding and agreed to proceed.  HPI: Complains of thick productive cough  x 3 months, comes and goes.  Worse at night.   Endorses sinus congestion.   She is compliant with flonase, antihistamine.   She feels GERD is well controlled.   No cp, sob, fever  She will start Mounjaro 5mg  this week.  She has not lost weight on mounjaro.   She has follow up with Dr Dossie Arbour for left shoulder pain.   Echocardiogram LVEF 60-65%  She has h/o stress incontinence. She is worried infection. No dysuria, hematuria. Urine is dark.    ROS: See pertinent positives and negatives per HPI.  EXAM:  VITALS per patient if applicable: There were no vitals taken for this visit. BP Readings from Last 3 Encounters:  12/12/22 118/77  11/26/22 112/72  11/06/22 137/89   Wt Readings from Last 3 Encounters:  12/12/22 260 lb (117.9 kg)  11/26/22 260 lb (117.9 kg)  11/06/22 265 lb (120.2 kg)    GENERAL: alert, oriented, appears well and in no acute distress  HEENT: atraumatic, conjunttiva clear, no obvious abnormalities on inspection of external nose and ears  NECK: normal movements of the head and neck  LUNGS: on inspection no signs of respiratory distress, breathing rate appears normal, no obvious gross SOB, gasping or wheezing  CV: no obvious cyanosis  MS: moves all visible extremities without noticeable abnormality  PSYCH/NEURO: pleasant and cooperative, no obvious depression or anxiety, speech and thought processing grossly  intact  ASSESSMENT AND PLAN: Chronic cough Assessment & Plan: Chronic.  Previously tried Symbicort.  Reflux seems to be well-controlled.  Pending chest x-ray, azithromycin.  If symptoms do not resolve will recommend referral to pulmonology.  Consider trial stop ARB.   Orders: -     B12 and Folate Panel; Future -     DG Chest 2 View; Future -     Azithromycin; Take 2 tablets on day 1, then 1 tablet daily on days 2 through 5  Dispense: 6 tablet; Refill: 0  History of vitamin D deficiency -     VITAMIN D 25 Hydroxy (Vit-D Deficiency, Fractures); Future  B12 deficiency -     B12 and Folate Panel; Future  OAB (overactive bladder) Assessment & Plan: Symptoms controlled with oxybutynin 5 mg qpm. Symptom of dark urine.  Pending urinalysis, urine culture.  Orders: -     Urinalysis, Routine w reflex microscopic; Future -     Urine Culture; Future     -we discussed possible serious and likely etiologies, options for evaluation and workup, limitations of telemedicine visit vs in person visit, treatment, treatment risks and precautions. Pt prefers to treat via telemedicine empirically rather then risking or undertaking an in person visit at this moment.    I discussed the assessment and treatment plan with the patient. The patient was provided an opportunity to ask questions and all were answered. The patient agreed with the plan and demonstrated an understanding of the instructions.   The patient was advised to call back or seek an in-person evaluation if the  symptoms worsen or if the condition fails to improve as anticipated.  Advised if desired AVS can be mailed or viewed via [place] if [place] user.   Mable Paris, FNP

## 2023-01-08 NOTE — Progress Notes (Unsigned)
PROVIDER NOTE: Interpretation of information contained herein should be left to medically-trained personnel. Specific patient instructions are provided elsewhere under "Patient Instructions" section of medical record. This document was created in part using STT-dictation technology, any transcriptional errors that may result from this process are unintentional.  Patient: Jacqueline Delgado Type: Established DOB: [birth year] MRN: [id number] PCP: Burnard Hawthorne, FNP  Service: Procedure DOS: 01/09/2023 Setting: Ambulatory Location: Ambulatory outpatient facility Delivery: Face-to-face Provider: Gaspar Cola, MD Specialty: Interventional Pain Management Specialty designation: 09 Location: Outpatient facility Ref. Prov.: Burnard Hawthorne, FNP       Interventional Therapy   Procedure: Glenohumeral and acromioclavicular joint Injection #1  Laterality: Left (-LT)  Level: Shoulder   Imaging: Fluoroscopy-guided         Anesthesia: Local anesthesia (1-2% Lidocaine) Anxiolysis: IV Versed 2.0 mg Sedation: Moderate Sedation Fentanyl 1 mL (50 mcg) DOS: 01/09/2023  Performed by: Gaspar Cola, MD  Purpose: Diagnostic/Therapeutic Indications: Shoulder pain severe enough to impact quality of life or function. Rationale (medical necessity): procedure needed and proper for the diagnosis and/or treatment of [name]'s medical symptoms and needs. 1. Chronic shoulder pain (Multifactorial) (Left)   2. Arthralgia of acromioclavicular joint (Left)   3. Osteoarthritis of AC (acromioclavicular) joint (Left)   4. Osteoarthritis of shoulder (Left)    NAS-11 Pain score:   Pre-procedure: 4 /10   Post-procedure: 4 /10      Target: Glenohumeral, acromioclavicular joint, and subacromial bursa Location: Intra-articular  Region: Entire Shoulder Area Approach: Anterior approach. Type of procedure: Percutaneous joint injection   Position  Prep  Materials:  Position: Supine Prep solution:  DuraPrep (Iodine Povacrylex [0.7% available iodine] and Isopropyl Alcohol, 74% w/w) Prep Area: Entire shoulder Area Materials:  Tray: Block Needle(s):  Type: Spinal  Gauge (G): 22  Length: 3.5-in  Qty: 1  Pre-op H&P Assessment:  [name] is a 72 y.o. (year old), female patient, seen today for interventional treatment. She  has a past surgical history that includes Joint replacement (Right); Joint replacement; Cholecystectomy; Abdominal hysterectomy; Shoulder arthroscopy with rotator cuff repair and subacromial decompression (Right, 06/14/2015); Esophagogastroduodenoscopy (egd) with propofol (N/A, 10/11/2015); Savory dilation (N/A, 10/11/2015); Colonoscopy w/ polypectomy; Esophagogastroduodenoscopy (egd) with propofol (N/A, 12/10/2017); Colonoscopy with propofol (N/A, 12/10/2017); Total knee arthroplasty (Left, 03/06/2021); Joint replacement (Left); Esophagogastroduodenoscopy (egd) with propofol (N/A, 05/03/2022); and Breast surgery. [name] has a current medication list which includes the following prescription(s): aciphex, famotidine, fluticasone, hydrochlorothiazide, lidocaine, losartan, ondansetron, paroxetine, potassium chloride, promethazine, tirzepatide, tizanidine, and trazodone, and the following Facility-Administered Medications: fentanyl and pentafluoroprop-tetrafluoroeth. Her primarily concern today is the Shoulder Pain (Left)  Initial Vital Signs:  Pulse/HCG Rate: 77ECG Heart Rate: 69 (NSR) Temp: (!) 97.3 F (36.3 C) Resp: 18 BP: 116/76 SpO2: 96 %  BMI: Estimated body mass index is 46.59 kg/m as calculated from the following:   Height as of this encounter: 5\' 3"  (1.6 m).   Weight as of this encounter: 263 lb (119.3 kg).  Risk Assessment: Allergies: Reviewed. She has No Known Allergies.  Allergy Precautions: None required Coagulopathies: Reviewed. None identified.  Blood-thinner therapy: None at this time Active Infection(s): Reviewed. None identified. [name] is  afebrile  Site Confirmation: [name] was asked to confirm the procedure and laterality before marking the site Procedure checklist: Completed Consent: Before the procedure and under the influence of no sedative(s), amnesic(s), or anxiolytics, the patient was informed of the treatment options, risks and possible complications. To fulfill our ethical and legal obligations, as recommended by  the American Medical Association's Code of Ethics, I have informed the patient of my clinical impression; the nature and purpose of the treatment or procedure; the risks, benefits, and possible complications of the intervention; the alternatives, including doing nothing; the risk(s) and benefit(s) of the alternative treatment(s) or procedure(s); and the risk(s) and benefit(s) of doing nothing. The patient was provided information about the general risks and possible complications associated with the procedure. These may include, but are not limited to: failure to achieve desired goals, infection, bleeding, organ or nerve damage, allergic reactions, paralysis, and death. In addition, the patient was informed of those risks and complications associated to the procedure, such as failure to decrease pain; infection; bleeding; organ or nerve damage with subsequent damage to sensory, motor, and/or autonomic systems, resulting in permanent pain, numbness, and/or weakness of one or several areas of the body; allergic reactions; (i.e.: anaphylactic reaction); and/or death. Furthermore, the patient was informed of those risks and complications associated with the medications. These include, but are not limited to: allergic reactions (i.e.: anaphylactic or anaphylactoid reaction(s)); adrenal axis suppression; blood sugar elevation that in diabetics may result in ketoacidosis or comma; water retention that in patients with history of congestive heart failure may result in shortness of breath, pulmonary edema, and decompensation with  resultant heart failure; weight gain; swelling or edema; medication-induced neural toxicity; particulate matter embolism and blood vessel occlusion with resultant organ, and/or nervous system infarction; and/or aseptic necrosis of one or more joints. Finally, the patient was informed that Medicine is not an exact science; therefore, there is also the possibility of unforeseen or unpredictable risks and/or possible complications that may result in a catastrophic outcome. The patient indicated having understood very clearly. We have given the patient no guarantees and we have made no promises. Enough time was given to the patient to ask questions, all of which were answered to the patient's satisfaction. Ms. Hofmeyer has indicated that she wanted to continue with the procedure. Attestation: I, the ordering provider, attest that I have discussed with the patient the benefits, risks, side-effects, alternatives, likelihood of achieving goals, and potential problems during recovery for the procedure that I have provided informed consent. Date  Time: 01/09/2023  8:37 AM   Imaging Guidance (Non-Spinal):          Type of Imaging Technique: Fluoroscopy Guidance (Non-Spinal) Indication(s): Assistance in needle guidance and placement for procedures requiring needle placement in or near specific anatomical locations not easily accessible without such assistance. Exposure Time: Please see nurses notes. Contrast: None used. Fluoroscopic Guidance: I was personally present during the use of fluoroscopy. "Tunnel Vision Technique" used to obtain the best possible view of the target area. Parallax error corrected before commencing the procedure. "Direction-depth-direction" technique used to introduce the needle under continuous pulsed fluoroscopy. Once target was reached, antero-posterior, oblique, and lateral fluoroscopic projection used confirm needle placement in all planes. Images permanently stored in EMR. Interpretation:  No contrast injected. I personally interpreted the imaging intraoperatively. Adequate needle placement confirmed in multiple planes. Permanent images saved into the patient's record.  Pre-Procedure Preparation:  Monitoring: As per clinic protocol. Respiration, ETCO2, SpO2, BP, heart rate and rhythm monitor placed and checked for adequate function Safety Precautions: Patient was assessed for positional comfort and pressure points before starting the procedure. Time-out: I initiated and conducted the "Time-out" before starting the procedure, as per protocol. The patient was asked to participate by confirming the accuracy of the "Time Out" information. Verification of the correct person, site, and  procedure were performed and confirmed by me, the nursing staff, and the patient. "Time-out" conducted as per Joint Commission's Universal Protocol (UP.01.01.01). Time: 1009 Start Time: 1009 hrs.  Description  Narrative of Procedure:          Rationale (medical necessity): procedure needed and proper for the diagnosis and/or treatment of the patient's medical symptoms and needs. Procedural Technique Safety Precautions: Aspiration looking for blood return was conducted prior to all injections. At no point did we inject any substances, as a needle was being advanced. No attempts were made at seeking any paresthesias. Safe injection practices and needle disposal techniques used. Medications properly checked for expiration dates. SDV (single dose vial) medications used. Description of the Procedure: Protocol guidelines were followed. The patient was placed in position over the procedure table. The target area was identified and the area prepped in the usual manner. Skin & deeper tissues infiltrated with local anesthetic. Appropriate amount of time allowed to pass for local anesthetics to take effect. The procedure needles were then advanced to the target area. Proper needle placement secured. Negative aspiration  confirmed. Solution injected in intermittent fashion, asking for systemic symptoms every 0.5cc of injectate. The needles were then removed and the area cleansed, making sure to leave some of the prepping solution back to take advantage of its long term bactericidal properties.             Vitals:   01/09/23 1015 01/09/23 1025 01/09/23 1036 01/09/23 1047  BP: 126/87 112/66 109/60 130/75  Pulse:      Resp: 16 16 16 16   Temp:      TempSrc:      SpO2: 98% 94% 97% 96%  Weight:      Height:         Start Time: 1009 hrs. End Time: 1015 hrs.  Antibiotic Prophylaxis:   Anti-infectives (From admission, onward)    None      Indication(s): None identified  Post-operative Assessment:  Post-procedure Vital Signs:  Pulse/HCG Rate: 7766 Temp: (!) 97.3 F (36.3 C) Resp: 16 BP: 130/75 SpO2: 96 %  EBL: None  Complications: No immediate post-treatment complications observed by team, or reported by patient.  Note: The patient tolerated the entire procedure well. A repeat set of vitals were taken after the procedure and the patient was kept under observation following institutional policy, for this type of procedure. Post-procedural neurological assessment was performed, showing return to baseline, prior to discharge. The patient was provided with post-procedure discharge instructions, including a section on how to identify potential problems. Should any problems arise concerning this procedure, the patient was given instructions to immediately contact us, at any time, without hesitation. In any case, we plan to contact the patient by telephone for a follow-up status report regarding this interventional procedure.  Comments:  No additional relevant information.  Plan of Care (POC)  Orders:  Orders Placed This Encounter  Procedures   SHOULDER INJECTION    Scheduling Instructions:     Procedure: Intra-articular shoulder (Glenohumeral) joint and (AC) Acromioclavicular joint injection      Side: Left-sided     Level: Glenohumeral joint and (AC) Acromioclavicular joint     Sedation: Patient's choice.     Timeframe: Today    Order Specific Question:   Where will this procedure be performed?    Answer:   [facility]   DG PAIN [facility] C-ARM 1-60 MIN NO REPORT    Intraoperative interpretation by procedural physician at [place].  Standing Status:   Standing    Number of Occurrences:   1    Order Specific Question:   Reason for exam:    Answer:   Assistance in needle guidance and placement for procedures requiring needle placement in or near specific anatomical locations not easily accessible without such assistance.   Informed Consent Details: Physician/Practitioner Attestation; Transcribe to consent form and obtain patient signature    Note: Always confirm laterality of pain with Ms. Mckeel, before procedure.    Order Specific Question:   Physician/Practitioner attestation of informed consent for procedure/surgical case    Answer:   I, the physician/practitioner, attest that I have discussed with the patient the benefits, risks, side effects, alternatives, likelihood of achieving goals and potential problems during recovery for the procedure that I have provided informed consent.    Order Specific Question:   Procedure    Answer:   Intra-articular shoulder joint injection under fluoroscopic guidance    Order Specific Question:   Physician/Practitioner performing the procedure    Answer:   Deloy Archey A. Dossie Arbour, MD    Order Specific Question:   Indication/Reason    Answer:   Chronic shoulder pain secondary to shoulder arthropathy   Provide equipment / supplies at bedside    Procedure tray: "Block Tray" (Disposable  single use) Skin infiltration needle: Regular 1.5-in, 25-G, (x1) Block Needle type: Spinal Amount/quantity: 1 Size: Regular (3.5-inch) Gauge: 22G    Standing Status:   Standing    Number of Occurrences:   1    Order Specific Question:    Specify    Answer:   Block Tray   Chronic Opioid Analgesic:  No chronic opioid analgesics therapy prescribed by our practice. Tramadol 50 mg tablet, 1 tab p.o. twice daily (PRN) MME/day: 0-10 mg/day   Medications ordered for procedure: Meds ordered this encounter  Medications   lidocaine (XYLOCAINE) 2 % (with pres) injection 400 mg   pentafluoroprop-tetrafluoroeth (GEBAUERS) aerosol   lactated ringers infusion   midazolam (VERSED) 5 MG/5ML injection 0.5-2 mg    Make sure Flumazenil is available in the pyxis when using this medication. If oversedation occurs, administer 0.2 mg IV over 15 sec. If after 45 sec no response, administer 0.2 mg again over 1 min; may repeat at 1 min intervals; not to exceed 4 doses (1 mg)   fentaNYL (SUBLIMAZE) injection 25-50 mcg    Make sure Narcan is available in the pyxis when using this medication. In the event of respiratory depression (RR< 8/min): Titrate NARCAN (naloxone) in increments of 0.1 to 0.2 mg IV at 2-3 minute intervals, until desired degree of reversal.   ropivacaine (PF) 2 mg/mL (0.2%) (NAROPIN) injection 9 mL   methylPREDNISolone acetate (DEPO-MEDROL) injection 80 mg   Medications administered: We administered lidocaine, lactated ringers, midazolam, fentaNYL, ropivacaine (PF) 2 mg/mL (0.2%), and methylPREDNISolone acetate.  See the medical record for exact dosing, route, and time of administration.  Follow-up plan:   Return in about 2 weeks (around 01/23/2023) for Proc-day (T,Th), (Face2F), (PPE).       Interventional Therapies  Risk Factors  Considerations:  Cerebrovascular malformation  GERD  HTN MO  OSA  SOB No RFA until BMI is below 35 kg/m.   Planned  Pending:   Diagnostic/therapeutic left IA glenohumeral + AC joint inj. + subacromial bursa inj. #1 (01/09/2023)    Under consideration:   Therapeutic left CESI #2    Completed:   Therapeutic left cervical ESI x1 (11/26/2022) (75/75/50/50) (Neck: 100  (L) shoulder:  50)   Therapeutic/palliative bilateral lumbar facet MBB x3 (08/30/2021) (100/100/100/0) pain worsened after injection. Therapeutic right L4-5 LESI x1 (07/27/2020)  Referral to bariatric surgery and medical weight management entered on 07/18/2020.    Completed by other providers:   EEG x2 by Dr. Jennings Books (07/03/14, 07/29/14) - WNL  Diagnostic left occipital nerve block x1 (08/10/2014) by Dr. Jennings Books [facility] neurology)  Therapeutic right rotator cuff repair x1 (06/14/2015) by Franchot Mimes MD [facility]opedics)  Therapeutic left IA Synvisc knee injection x2 (10/02/2020) by Janus Molder, PA [facility]oped[facility] and [facility]) Therapeutic left total knee replacement x1 (03/06/2021) by Dr. Hessie Knows [facility] orthopedics)   Therapeutic  Palliative (PRN) options:   Palliative bilateral lumbar facet MBB #4  Therapeutic right L4-5 LESI #2       Recent Visits Date Type Provider Dept  12/12/22 Office Visit Milinda Pointer, [facility]  11/26/22 Procedure visit Milinda Pointer, MD [facility]  11/06/22 Office Visit Milinda Pointer, MD [facility]  Showing recent visits within past 90 days and meeting all other requirements Today's Visits Date Type Provider Dept  01/09/23 Procedure visit Milinda Pointer, MD [facility]  Showing today's visits and meeting all other requirements Future Appointments Date Type Provider Dept  01/23/23 Appointment Milinda Pointer, MD [facility]  Showing future appointments within next 90 days and meeting all other requirements  Disposition: Discharge home  Discharge (Date  Time): 01/09/2023; 1047 hrs.   Primary Care Physician: Burnard Hawthorne, FNP Location: [facility] Note by: Gaspar Cola, MD (TTS technology used. I apologize for any typographical errors that were not detected and corrected.) Date: 01/09/2023; Time: 11:31 AM  Disclaimer:  Medicine is not an  Chief Strategy Officer. The only guarantee in medicine is that nothing is guaranteed. It is important to note that the decision to proceed with this intervention was based on the information collected from the patient. The Data and conclusions were drawn from the patient's questionnaire, the interview, and the physical examination. Because the information was provided in large part by the patient, it cannot be guaranteed that it has not been purposely or unconsciously manipulated. Every effort has been made to obtain as much relevant data as possible for this evaluation. It is important to note that the conclusions that lead to this procedure are derived in large part from the available data. Always take into account that the treatment will also be dependent on availability of resources and existing treatment guidelines, considered by other Pain Management Practitioners as being common knowledge and practice, at the time of the intervention. For Medico-Legal purposes, it is also important to point out that variation in procedural techniques and pharmacological choices are the acceptable norm. The indications, contraindications, technique, and results of the above procedure should only be interpreted and judged by a Board-Certified Interventional Pain Specialist with extensive familiarity and expertise in the same exact procedure and technique.

## 2023-01-09 ENCOUNTER — Encounter: Payer: Self-pay | Admitting: Pain Medicine

## 2023-01-09 ENCOUNTER — Ambulatory Visit
Admission: RE | Admit: 2023-01-09 | Discharge: 2023-01-09 | Disposition: A | Discharge status: Home / Self Care | Payer: 59 | Source: Ambulatory Visit | Attending: Pain Medicine | Admitting: Pain Medicine

## 2023-01-09 ENCOUNTER — Ambulatory Visit: Payer: 59 | Attending: Pain Medicine | Admitting: Pain Medicine

## 2023-01-09 VITALS — BP 130/75 | HR 77 | Temp 97.3°F | Resp 16 | Ht 63.0 in | Wt 263.0 lb

## 2023-01-09 DIAGNOSIS — M19012 Primary osteoarthritis, left shoulder: Secondary | ICD-10-CM | POA: Insufficient documentation

## 2023-01-09 DIAGNOSIS — G8929 Other chronic pain: Secondary | ICD-10-CM | POA: Diagnosis not present

## 2023-01-09 DIAGNOSIS — Z86010 Personal history of colonic polyps: Secondary | ICD-10-CM | POA: Diagnosis not present

## 2023-01-09 DIAGNOSIS — M7592 Shoulder lesion, unspecified, left shoulder: Secondary | ICD-10-CM | POA: Diagnosis not present

## 2023-01-09 DIAGNOSIS — M25512 Pain in left shoulder: Secondary | ICD-10-CM | POA: Insufficient documentation

## 2023-01-09 DIAGNOSIS — K224 Dyskinesia of esophagus: Secondary | ICD-10-CM | POA: Diagnosis not present

## 2023-01-09 DIAGNOSIS — K219 Gastro-esophageal reflux disease without esophagitis: Secondary | ICD-10-CM | POA: Diagnosis not present

## 2023-01-09 DIAGNOSIS — Z800 Family history of malignant neoplasm of digestive organs: Secondary | ICD-10-CM | POA: Diagnosis not present

## 2023-01-09 DIAGNOSIS — M79622 Pain in left upper arm: Secondary | ICD-10-CM | POA: Insufficient documentation

## 2023-01-09 DIAGNOSIS — M778 Other enthesopathies, not elsewhere classified: Secondary | ICD-10-CM | POA: Insufficient documentation

## 2023-01-09 DIAGNOSIS — M7552 Bursitis of left shoulder: Secondary | ICD-10-CM | POA: Insufficient documentation

## 2023-01-09 DIAGNOSIS — R194 Change in bowel habit: Secondary | ICD-10-CM | POA: Diagnosis not present

## 2023-01-09 DIAGNOSIS — K449 Diaphragmatic hernia without obstruction or gangrene: Secondary | ICD-10-CM | POA: Diagnosis not present

## 2023-01-09 MED ORDER — MIDAZOLAM HCL 5 MG/5ML IJ SOLN
INTRAMUSCULAR | Status: AC
  Filled 2023-01-09: qty 5

## 2023-01-09 MED ORDER — MIDAZOLAM HCL 5 MG/5ML IJ SOLN
0.5000 mg | Freq: Once | INTRAMUSCULAR | Status: AC
  Administered 2023-01-09: 2 mg via INTRAVENOUS

## 2023-01-09 MED ORDER — METHYLPREDNISOLONE ACETATE 80 MG/ML IJ SUSP
80.0000 mg | Freq: Once | INTRAMUSCULAR | Status: AC
  Administered 2023-01-09: 80 mg via INTRA_ARTICULAR

## 2023-01-09 MED ORDER — LACTATED RINGERS IV SOLN: Freq: Once | INTRAVENOUS | Status: AC

## 2023-01-09 MED ORDER — FENTANYL CITRATE (PF) 100 MCG/2ML IJ SOLN
25.0000 ug | INTRAMUSCULAR | Status: DC | PRN
  Administered 2023-01-09: 50 ug via INTRAVENOUS

## 2023-01-09 MED ORDER — METHYLPREDNISOLONE ACETATE 80 MG/ML IJ SUSP
INTRAMUSCULAR | Status: AC
  Filled 2023-01-09: qty 1

## 2023-01-09 MED ORDER — ROPIVACAINE HCL 2 MG/ML IJ SOLN
9.0000 mL | Freq: Once | INTRAMUSCULAR | Status: AC
  Administered 2023-01-09: 9 mL via INTRA_ARTICULAR

## 2023-01-09 MED ORDER — ROPIVACAINE HCL 2 MG/ML IJ SOLN
INTRAMUSCULAR | Status: AC
  Filled 2023-01-09: qty 20

## 2023-01-09 MED ORDER — PENTAFLUOROPROP-TETRAFLUOROETH EX AERO: INHALATION_SPRAY | Freq: Once | CUTANEOUS | Status: DC

## 2023-01-09 MED ORDER — LIDOCAINE HCL 2 % IJ SOLN
20.0000 mL | Freq: Once | INTRAMUSCULAR | Status: AC
  Administered 2023-01-09: 100 mg

## 2023-01-09 MED ORDER — LIDOCAINE HCL (PF) 2 % IJ SOLN
INTRAMUSCULAR | Status: AC
  Filled 2023-01-09: qty 15

## 2023-01-09 MED ORDER — FENTANYL CITRATE (PF) 100 MCG/2ML IJ SOLN
INTRAMUSCULAR | Status: AC
  Filled 2023-01-09: qty 2

## 2023-01-09 NOTE — Progress Notes (Signed)
Safety precautions to be maintained throughout the outpatient stay will include: orient to surroundings, keep bed in low position, maintain call bell within reach at all times, provide assistance with transfer out of bed and ambulation.  

## 2023-01-09 NOTE — Patient Instructions (Signed)

## 2023-01-10 ENCOUNTER — Telehealth: Payer: Self-pay | Admitting: *Deleted

## 2023-01-10 NOTE — Telephone Encounter (Signed)
Post procedure call;  states that she experienced a lot of pain yesterday after she left that seemed to have gotten better through the course of the evening.  No c/o today.

## 2023-01-15 ENCOUNTER — Other Ambulatory Visit: Payer: 59

## 2023-01-22 NOTE — Progress Notes (Unsigned)
PROVIDER NOTE: Information contained herein reflects review and annotations entered in association with encounter. Interpretation of such information and data should be left to medically-trained personnel. Information provided to patient can be located elsewhere in the medical record under "Patient Instructions". Document created using STT-dictation technology, any transcriptional errors that may result from process are unintentional.    Patient: Jacqueline Delgado  Service Category: E/M  Provider: Oswaldo Done, MD  DOB: Nov 30, 1950  DOS: 01/23/2023  Referring Provider: Allegra Grana, FNP  MRN: [id number]  Specialty: Interventional Pain Management  PCP: Allegra Grana, FNP  Type: Established Patient  Setting: Ambulatory outpatient    Location: Office  Delivery: Face-to-face     HPI  [name], a 72 y.o. year old female, is here today because of her No primary diagnosis found.. [name] primary complain today is No chief complaint on file.  Pertinent problems: [name] has Plantar fasciitis; Localized swelling of finger of left hand; Acquired trigger finger; Closed nondisplaced fracture of base of fifth metacarpal bone of right hand; Complete tear of right rotator cuff; DDD (degenerative disc disease), cervical; DDD (degenerative disc disease), lumbar; Chronic low back pain (1ry area of Pain) (Bilateral) (R>L) w/ sciatica (Right); Neck pain; Non-cardiac chest pain; S/P rotator cuff repair; Osteoarthritis, knee; Unspecified osteoarthritis, unspecified site; Osteoarthritis of knee (Left); Ankle swelling, right; Chronic pain syndrome; DDD (degenerative disc disease), lumbosacral; Lumbar facet hypertrophy; Abnormal MRI, lumbar spine (08/11/20); Lumbosacral central spinal stenosis (9 mm) (L4-5 and L5-S1); Chronic lower extremity pain (Right); Lumbosacral radiculopathy at L5 (Right); Chronic knee pain (3ry area of Pain) (Left); Swelling of foot (4th area of Pain) (Right); Chronic lower  extremity pain (2ry area of Pain) (Bilateral) (R>L); Calcaneal spur of foot (Right); Lumbar Grade 1 Anterolisthesis of L4/L5; Lumbosacral facet syndrome (Bilateral) (R>L); Chronic low back pain (Bilateral) (R>L) w/o sciatica; Spondylosis without myelopathy or radiculopathy, lumbosacral region; S/P TKR (total knee replacement) using cement, left; Chronic knee pain after total replacement of knee joint (Left); Osteoarthritis of lumbar spine; Osteoarthritis involving multiple joints; Acute midline low back pain without sciatica; Chronic shoulder pain (Multifactorial) (Left); Abnormal MRI, cervical spine (07/25/2022); Cervicalgia; Cervical foraminal stenosis (Severe, Left: C3-4, C4-5) (Bilateral: C5-6, C6-7); Radicular pain of shoulder (Left); Cervical radiculopathy (Left) (C4, C5); Osteoarthritis of shoulder (Left); Osteoarthritis of AC (acromioclavicular) joint (Left); Arthralgia of acromioclavicular joint (Left); Upper arm pain (Left); Subacromial bursitis of shoulder joint (Left); Subdeltoid bursitis of shoulder joint (Left); Subscapularis tendinosis (Left); and Supraspinatus tendinosis (Left) on their pertinent problem list. Pain Assessment: Severity of   is reported as a  /10. Location:    / . Onset:  . Quality:  . Timing:  . Modifying factor(s):  Marland Kitchen Vitals:  vitals were not taken for this visit.  BMI: Estimated body mass index is 46.59 kg/m as calculated from the following:   Height as of 01/09/23: 5\' 3"  (1.6 m).   Weight as of 01/09/23: 263 lb (119.3 kg). Last encounter: 12/12/2022. Last procedure: 01/09/2023.  Reason for encounter: post-procedure evaluation and assessment. ***  Post-procedure evaluation   Procedure: Glenohumeral and acromioclavicular joint Injection #1  Laterality: Left (-LT)  Level: Shoulder   Imaging: Fluoroscopy-guided         Anesthesia: Local anesthesia (1-2% Lidocaine) Anxiolysis: IV Versed 2.0 mg Sedation: Moderate Sedation Fentanyl 1 mL (50 mcg) DOS: 01/09/2023   Performed by: Oswaldo Done, MD  Purpose: Diagnostic/Therapeutic Indications: Shoulder pain severe enough to impact quality of life or function. Rationale (medical necessity):  procedure needed and proper for the diagnosis and/or treatment of [name] medical symptoms and needs. 1. Chronic shoulder pain (Multifactorial) (Left)   2. Arthralgia of acromioclavicular joint (Left)   3. Osteoarthritis of AC (acromioclavicular) joint (Left)   4. Osteoarthritis of shoulder (Left)    NAS-11 Pain score:   Pre-procedure: 4 /10   Post-procedure: 4 /10       Effectiveness:  Initial hour after procedure:   ***. Subsequent 4-6 hours post-procedure:   ***. Analgesia past initial 6 hours:   ***. Ongoing improvement:  Analgesic:  *** Function:    ***    ROM:    ***     Pharmacotherapy Assessment  Analgesic: No chronic opioid analgesics therapy prescribed by our practice. Tramadol 50 mg tablet, 1 tab p.o. twice daily (PRN) MME/day: 0-10 mg/day   Monitoring: [place] PMP: PDMP reviewed during this encounter.       Pharmacotherapy: No side-effects or adverse reactions reported. Compliance: No problems identified. Effectiveness: Clinically acceptable.  No notes on file  No results found for: "CBDTHCR" No results found for: "D8THCCBX" No results found for: "D9THCCBX"  UDS:  Summary  Date Value Ref Range Status  05/22/2020 Note  Final    Comment:    ==================================================================== Compliance Drug Analysis, Ur ==================================================================== Test                             Result       Flag       Units  Drug Present and Declared for Prescription Verification   Tramadol                       >3268        EXPECTED   ng/mg creat   O-Desmethyltramadol            2372         EXPECTED   ng/mg creat   N-Desmethyltramadol            1509         EXPECTED   ng/mg creat    Source of tramadol is a prescription  medication. O-desmethyltramadol    and N-desmethyltramadol are expected metabolites of tramadol.    Bupropion                      PRESENT      EXPECTED   Hydroxybupropion               PRESENT      EXPECTED    Hydroxybupropion is an expected metabolite of bupropion.  Drug Present not Declared for Prescription Verification   Dextromethorphan               PRESENT      UNEXPECTED   Dextrorphan/Levorphanol        PRESENT      UNEXPECTED    Dextrorphan is an expected metabolite of dextromethorphan, an over-    the-counter or prescription cough suppressant. Dextrorphan cannot be    distinguished from the scheduled prescription medication levorphanol    by the method used for analysis.  ==================================================================== Test                      Result    Flag   Units      Ref Range   Creatinine  153              mg/dL      >=67 ==================================================================== Declared Medications:  The flagging and interpretation on this report are based on the  following declared medications.  Unexpected results may arise from  inaccuracies in the declared medications.   **Note: The testing scope of this panel includes these medications:   Bupropion (Wellbutrin)  Tramadol (Ultram)   **Note: The testing scope of this panel does not include the  following reported medications:   Famotidine (Pepcid)  Hydrochlorothiazide (Hydrodiuril)  Losartan (Cozaar)  Meloxicam (Mobic)  Mirabegron (Myrbetriq)  Rabeprazole (Aciphex) ==================================================================== For clinical consultation, please call [phone]. ====================================================================       ROS  Constitutional: Denies any fever or chills Gastrointestinal: No reported hemesis, hematochezia, vomiting, or acute GI distress Musculoskeletal: Denies any acute onset joint swelling, redness,  loss of ROM, or weakness Neurological: No reported episodes of acute onset apraxia, aphasia, dysarthria, agnosia, amnesia, paralysis, loss of coordination, or loss of consciousness  Medication Review  PARoxetine, RABEprazole, famotidine, fluticasone, hydrochlorothiazide, lidocaine, losartan, ondansetron, potassium chloride, promethazine, tiZANidine, tirzepatide, and traZODone  History Review  Allergy: [name] has No Known Allergies. Drug: [name]  reports no history of drug use. Alcohol:  reports no history of alcohol use. Tobacco:  reports that she has never smoked. She has never used smokeless tobacco. Social: [name]  reports that she has never smoked. She has never used smokeless tobacco. She reports that she does not drink alcohol and does not use drugs. Medical:  has a past medical history of Anemia, Anginal pain (HCC), Arthritis, Colon polyp, GERD (gastroesophageal reflux disease), Hiatal hernia, History of hiatal hernia, History of methicillin resistant staphylococcus aureus (MRSA), Hypertension, Obesity, Pre-diabetes, Reflux, Sleep apnea, and Small vessel disease (HCC). Surgical: Ms. Silverio  has a past surgical history that includes Joint replacement (Right); Joint replacement; Cholecystectomy; Abdominal hysterectomy; Shoulder arthroscopy with rotator cuff repair and subacromial decompression (Right, 06/14/2015); Esophagogastroduodenoscopy (egd) with propofol (N/A, 10/11/2015); Savory dilation (N/A, 10/11/2015); Colonoscopy w/ polypectomy; Esophagogastroduodenoscopy (egd) with propofol (N/A, 12/10/2017); Colonoscopy with propofol (N/A, 12/10/2017); Total knee arthroplasty (Left, 03/06/2021); Joint replacement (Left); Esophagogastroduodenoscopy (egd) with propofol (N/A, 05/03/2022); and Breast surgery. Family: family history includes Alcoholism in an other family member; Arthritis in an other family member; Breast cancer in her maternal grandmother; Breast cancer (age of onset: 3) in  her mother; Diabetes in an other family member; Heart disease in her father; Hyperlipidemia in her father; Hypertension in her father; Stroke in her father.  Laboratory Chemistry Profile   Renal Lab Results  Component Value Date   BUN 16 09/13/2022   CREATININE 1.07 (H) 09/13/2022   BCR 20 01/19/2021   GFR 57.12 (L) 07/10/2022   GFRAA 81 09/13/2020   GFRNONAA 56 (L) 09/13/2022    Hepatic Lab Results  Component Value Date   AST 11 03/13/2022   ALT 7 03/13/2022   ALBUMIN 4.0 03/13/2022   ALKPHOS 97 03/13/2022   LIPASE 36 10/12/2017    Electrolytes Lab Results  Component Value Date   NA 143 09/13/2022   K 3.5 09/13/2022   CL 108 09/13/2022   CALCIUM 9.5 09/13/2022   MG 2.0 05/22/2020    Bone Lab Results  Component Value Date   VD25OH 16.47 (L) 10/21/2022   25OHVITD1 18 (L) 05/22/2020   25OHVITD2 13 05/22/2020   25OHVITD3 5.3 05/22/2020    Inflammation (CRP: Acute Phase) (ESR: Chronic Phase) Lab Results  Component Value Date  CRP 15 (H) 05/22/2020   ESRSEDRATE 73 (H) 05/22/2020         Note: Above Lab results reviewed.  Recent Imaging Review  DG PAIN CLINIC C-ARM 1-60 MIN NO REPORT Fluoro was used, but no Radiologist interpretation will be provided.  Please refer to "NOTES" tab for provider progress note. Note: Reviewed        Physical Exam  General appearance: Well nourished, well developed, and well hydrated. In no apparent acute distress Mental status: Alert, oriented x 3 (person, place, & time)       Respiratory: No evidence of acute respiratory distress Eyes: PERLA Vitals: There were no vitals taken for this visit. BMI: Estimated body mass index is 46.59 kg/m as calculated from the following:   Height as of 01/09/23: 5\' 3"  (1.6 m).   Weight as of 01/09/23: 263 lb (119.3 kg). Ideal: Ideal body weight: 52.4 kg (115 lb 8.3 oz) Adjusted ideal body weight: 79.2 kg (174 lb 8.2 oz)  Assessment   Diagnosis Status  No diagnosis found.  Controlled Controlled Controlled   Updated Problems: No problems updated.  Plan of Care  Problem-specific:  No problem-specific Assessment & Plan notes found for this encounter.  [name] has a current medication list which includes the following long-term medication(s): aciphex, fluticasone, hydrochlorothiazide, losartan, paroxetine, potassium chloride, and trazodone.  Pharmacotherapy (Medications Ordered): No orders of the defined types were placed in this encounter.  Orders:  No orders of the defined types were placed in this encounter.  Follow-up plan:   No follow-ups on file.      Interventional Therapies  Risk Factors  Considerations:  Cerebrovascular malformation  GERD  HTN MO  OSA  SOB No RFA until BMI is below 35 kg/m.   Planned  Pending:   Diagnostic/therapeutic left IA glenohumeral + AC joint inj. + subacromial bursa inj. #1 (01/09/2023)    Under consideration:   Therapeutic left CESI #2    Completed:   Therapeutic left cervical ESI x1 (11/26/2022) (75/75/50/50) (Neck: 100  (L) shoulder: 50)  Therapeutic/palliative bilateral lumbar facet MBB x3 (08/30/2021) (100/100/100/0) pain worsened after injection. Therapeutic right L4-5 LESI x1 (07/27/2020)  Referral to bariatric surgery and medical weight management entered on 07/18/2020.    Completed by other providers:   EEG x2 by Dr. Cristopher Peru (07/03/14, 07/29/14) - WNL  Diagnostic left occipital nerve block x1 (08/10/2014) by Dr. Cristopher Peru [facility] neurology)  Therapeutic right rotator cuff repair x1 (06/14/2015) by Neva Seat MD [facility] orthopedics)  Therapeutic left IA Synvisc knee injection x2 (10/02/2020) by Nolon Rod, PA [facility] orthopedics and PMR) Therapeutic left total knee replacement x1 (03/06/2021) by Dr. Kennedy Bucker [facility] orthopedics)   Therapeutic  Palliative (PRN) options:   Palliative bilateral lumbar facet MBB #4  Therapeutic right L4-5 LESI #2        Recent Visits Date  Type Provider Dept  01/09/23 Procedure visit Delano Metz, MD [facility]  12/12/22 Office Visit Delano Metz, MD [facility]  11/26/22 Procedure visit Delano Metz, MD [facility]  11/06/22 Office Visit Delano Metz, MD [facility]  Showing recent visits within past 90 days and meeting all other requirements Future Appointments Date Type Provider Dept  01/23/23 Appointment Delano Metz, MD [facility]  Showing future appointments within next 90 days and meeting all other requirements  I discussed the assessment and treatment plan with the patient. The patient was provided an opportunity to ask questions and  all were answered. The patient agreed with the plan and demonstrated an understanding of the instructions.  Patient advised to call back or seek an in-person evaluation if the symptoms or condition worsens.  Duration of encounter: *** minutes.  Total time on encounter, as per AMA guidelines included both the face-to-face and non-face-to-face time personally spent by the physician and/or other qualified health care professional(s) on the day of the encounter (includes time in activities that require the physician or other qualified health care professional and does not include time in activities normally performed by clinical staff). Physician's time may include the following activities when performed: Preparing to see the patient (e.g., pre-charting review of records, searching for previously ordered imaging, lab work, and nerve conduction tests) Review of prior analgesic pharmacotherapies. Reviewing PMP Interpreting ordered tests (e.g., lab work, imaging, nerve conduction tests) Performing post-procedure evaluations, including interpretation of diagnostic procedures Obtaining and/or reviewing separately obtained history Performing a medically appropriate examination and/or evaluation Counseling and educating  the patient/family/caregiver Ordering medications, tests, or procedures Referring and communicating with other health care professionals (when not separately reported) Documenting clinical information in the electronic or other health record Independently interpreting results (not separately reported) and communicating results to the patient/ family/caregiver Care coordination (not separately reported)  Note by: Oswaldo Done, MD Date: 01/23/2023; Time: 6:40 AM

## 2023-01-23 ENCOUNTER — Ambulatory Visit (HOSPITAL_BASED_OUTPATIENT_CLINIC_OR_DEPARTMENT_OTHER): Payer: 59 | Admitting: Pain Medicine

## 2023-01-23 DIAGNOSIS — Z91199 Patient's noncompliance with other medical treatment and regimen due to unspecified reason: Secondary | ICD-10-CM

## 2023-01-24 ENCOUNTER — Other Ambulatory Visit: Payer: 59

## 2023-01-25 ENCOUNTER — Other Ambulatory Visit: Payer: Self-pay | Admitting: Family

## 2023-01-25 DIAGNOSIS — F331 Major depressive disorder, recurrent, moderate: Secondary | ICD-10-CM

## 2023-01-25 DIAGNOSIS — M25512 Pain in left shoulder: Secondary | ICD-10-CM

## 2023-01-28 NOTE — Telephone Encounter (Signed)
Patient returned call from Jenate Swaz[place], CMA.  I read message from Rennie Plowman, FNP, to patient.  Patient states she is still taking the hydrochlorothiazide (HYDRODIURIL) 25 MG tablet (Expired) but she has not taken the Wellbutrin in months.

## 2023-01-28 NOTE — Telephone Encounter (Signed)
LVM to call back to office  

## 2023-01-29 ENCOUNTER — Other Ambulatory Visit (INDEPENDENT_AMBULATORY_CARE_PROVIDER_SITE_OTHER): Payer: 59

## 2023-01-29 ENCOUNTER — Ambulatory Visit (INDEPENDENT_AMBULATORY_CARE_PROVIDER_SITE_OTHER): Payer: 59

## 2023-01-29 DIAGNOSIS — N3281 Overactive bladder: Secondary | ICD-10-CM

## 2023-01-29 DIAGNOSIS — Z8639 Personal history of other endocrine, nutritional and metabolic disease: Secondary | ICD-10-CM

## 2023-01-29 DIAGNOSIS — E538 Deficiency of other specified B group vitamins: Secondary | ICD-10-CM | POA: Diagnosis not present

## 2023-01-29 DIAGNOSIS — R053 Chronic cough: Secondary | ICD-10-CM

## 2023-01-29 DIAGNOSIS — R059 Cough, unspecified: Secondary | ICD-10-CM | POA: Diagnosis not present

## 2023-01-29 LAB — URINALYSIS, ROUTINE W REFLEX MICROSCOPIC
Bilirubin Urine: NEGATIVE
Hgb urine dipstick: NEGATIVE
Ketones, ur: NEGATIVE
Leukocytes,Ua: NEGATIVE
Nitrite: NEGATIVE
Specific Gravity, Urine: 1.02 (ref 1.000–1.030)
Urine Glucose: NEGATIVE
Urobilinogen, UA: 1 (ref 0.0–1.0)
pH: 7.5 (ref 5.0–8.0)

## 2023-01-29 LAB — VITAMIN D 25 HYDROXY (VIT D DEFICIENCY, FRACTURES): VITD: 21.08 ng/mL — ABNORMAL LOW (ref 30.00–100.00)

## 2023-01-29 LAB — B12 AND FOLATE PANEL
Folate: 16.7 ng/mL (ref >5.9)
Vitamin B-12: 736 pg/mL (ref 211–911)

## 2023-01-30 LAB — URINE CULTURE
MICRO NUMBER:: 14837336
Result:: NO GROWTH
SPECIMEN QUALITY:: ADEQUATE

## 2023-02-10 ENCOUNTER — Encounter: Payer: Self-pay | Admitting: Pharmacist

## 2023-02-10 DIAGNOSIS — R7303 Prediabetes: Secondary | ICD-10-CM

## 2023-02-10 DIAGNOSIS — Z9189 Other specified personal risk factors, not elsewhere classified: Secondary | ICD-10-CM

## 2023-02-10 NOTE — Progress Notes (Addendum)
[facility]) [facility] Quality Pharmacy Team Statin Quality Measure Assessment  02/10/2023  Jacqueline Delgado Nov 03, 1950 [id number]  Per review of chart and payor information, patient is taking a medication used to treat diabetes but is not currently filling a statin prescription.  This places patient into the Statin Use In Patients with Diabetes (SUPD) measure for CMS.    I could not find any documentation of previous trial of a statin or a history of statin intolerance.  However, patient could be excluded from the measure since she has Prediabetes.  She has an upcoming appointment 02/12/23.  If deemed therapeutically appropriate, statin assessment could be completed at the upcoming visit.  The 10-year ASCVD risk score (Arnett DK, et al., 2019) is: 21.1%   Values used to calculate the score:     Age: 71 years     Sex: Female     Is Non-Hispanic African American: Yes     Diabetic: Yes     Tobacco smoker: No     Systolic Blood Pressure: 130 mmHg     Is BP treated: Yes     HDL Cholesterol: 42 mg/dL     Total Cholesterol: 146 mg/dL 78/29/5621     Component Value Date/Time   CHOL 146 10/05/2021 1034   CHOL 134 09/13/2020 0912   CHOL 135 06/10/2014 0411   TRIG 60.0 10/05/2021 1034   TRIG 63 06/10/2014 0411   HDL 42.00 10/05/2021 1034   HDL 38 (L) 09/13/2020 0912   HDL 36 (L) 06/10/2014 0411   CHOLHDL 3 10/05/2021 1034   VLDL 12.0 10/05/2021 1034   VLDL 13 06/10/2014 0411   LDLCALC 92 10/05/2021 1034   LDLCALC 83 09/13/2020 0912   LDLCALC 86 06/10/2014 0411    Please consider ONE of the following recommendations:  Initiate high intensity statin Atorvastatin 40 mg once daily, #90, 3 refills   Rosuvastatin 20 mg once daily, #90, 3 refills    Initiate moderate intensity          statin with reduced frequency if prior          statin intolerance 1x weekly, #13, 3 refills   2x weekly, #26, 3 refills   3x weekly, #39, 3 refills    Code for past statin intolerance or   other exclusions (required annually)  Provider Requirements: Associate code during an office visit or telehealth encounter  Drug Induced Myopathy G72.0   Myopathy, unspecified G72.9   Myositis, unspecified M60.9   Rhabdomyolysis M62.82   Cirrhosis of liver K74.69   Prediabetes R73.03   PCOS E28.2    Plan: Route note to Provider prior to upcoming appointment.  Beecher Mcardle, PharmD, BCACP [facility] Clinical Pharmacist [phone]

## 2023-02-11 ENCOUNTER — Telehealth: Payer: Self-pay | Admitting: Family

## 2023-02-11 NOTE — Telephone Encounter (Signed)
Contacted Jacqueline Delgado to schedule their annual wellness visit. Appointment made for 02/19/2023.  Thank you,  [facility] Support [facility] Direct dial  [phone]

## 2023-02-12 ENCOUNTER — Encounter: Payer: Self-pay | Admitting: Family

## 2023-02-12 ENCOUNTER — Telehealth (INDEPENDENT_AMBULATORY_CARE_PROVIDER_SITE_OTHER): Payer: 59 | Admitting: Family

## 2023-02-12 VITALS — BP 109/74

## 2023-02-12 DIAGNOSIS — F331 Major depressive disorder, recurrent, moderate: Secondary | ICD-10-CM | POA: Diagnosis not present

## 2023-02-12 DIAGNOSIS — Z13220 Encounter for screening for lipoid disorders: Secondary | ICD-10-CM | POA: Diagnosis not present

## 2023-02-12 DIAGNOSIS — Z136 Encounter for screening for cardiovascular disorders: Secondary | ICD-10-CM | POA: Diagnosis not present

## 2023-02-12 DIAGNOSIS — R7303 Prediabetes: Secondary | ICD-10-CM

## 2023-02-12 DIAGNOSIS — Z6841 Body Mass Index (BMI) 40.0 and over, adult: Secondary | ICD-10-CM

## 2023-02-12 MED ORDER — TIRZEPATIDE 7.5 MG/0.5ML ~~LOC~~ SOAJ
7.5000 mg | SUBCUTANEOUS | 2 refills | Status: DC
Start: 2023-02-12 — End: 2023-03-26

## 2023-02-12 MED ORDER — FLUOXETINE HCL 20 MG PO CAPS
20.0000 mg | ORAL_CAPSULE | Freq: Every morning | ORAL | 3 refills | Status: DC
Start: 2023-02-12 — End: 2023-03-04

## 2023-02-12 NOTE — Assessment & Plan Note (Signed)
Lab Results  Component Value Date   HGBA1C 5.5 10/21/2022   Patient lost 10 lbs on mounjaro 2.5mg , since plateaued.

## 2023-02-12 NOTE — Progress Notes (Signed)
Virtual Visit via Video Note  I connected with Jacqueline Delgado on 02/14/23 at  2:30 PM EDT by a video enabled telemedicine application and verified that I am speaking with the correct person using two identifiers. Location patient: home Location provider: work  Persons participating in the virtual visit: patient, provider  I discussed the limitations of evaluation and management by telemedicine and the availability of in person appointments. The patient expressed understanding and agreed to proceed.  HPI: She has lost 10 lbs on mounjaro 5mg  for 4 weeks  and now she has plateaued for the past 3 weeks. She has loss of appetite. She is working out and feeling better about her health.    H/o prediabetes  She is interested in resuming prozac.  Although she feels better, she does feel increased anxiety  and depression at times as she has had difficulty finding work.  Her family has mentioned to her resuming Prozac .  She is no longer paxil, trazodone.   Hot flashes have completely resolved.        ROS: See pertinent positives and negatives per HPI.  EXAM:  VITALS per patient if applicable: BP 109/74  BP Readings from Last 3 Encounters:  02/12/23 109/74  01/09/23 130/75  12/12/22 118/77   Wt Readings from Last 3 Encounters:  01/09/23 263 lb (119.3 kg)  12/12/22 260 lb (117.9 kg)  11/26/22 260 lb (117.9 kg)    GENERAL: alert, oriented, appears well and in no acute distress  HEENT: atraumatic, conjunttiva clear, no obvious abnormalities on inspection of external nose and ears  NECK: normal movements of the head and neck  LUNGS: on inspection no signs of respiratory distress, breathing rate appears normal, no obvious gross SOB, gasping or wheezing  CV: no obvious cyanosis  MS: moves all visible extremities without noticeable abnormality  PSYCH/NEURO: pleasant and cooperative, no obvious depression or anxiety, speech and thought processing grossly intact  ASSESSMENT AND  PLAN: Prediabetes Assessment & Plan: Lab Results  Component Value Date   HGBA1C 5.5 10/21/2022   Patient lost 10 lbs on mounjaro 2.5mg , since plateaued.   Orders: -     Tirzepatide; Inject 7.5 mg into the skin once a week.  Dispense: 6 mL; Refill: 2 -     Hemoglobin A1c; Future  Encounter for lipid screening for cardiovascular disease -     Lipid panel; Future  Depression, major, recurrent, moderate (HCC) Assessment & Plan: Hot flashes have resolved.  She is no longer on Paxil.  Start Prozac 20 mg and titrate.  Patient will let me know how she is doing.  Orders: -     FLUoxetine HCl; Take 1 capsule (20 mg total) by mouth every morning.  Dispense: 90 capsule; Refill: 3  Morbid obesity with BMI of 40.0-44.9, adult [facility]) Assessment & Plan: Congratulated patient on weight loss.  She is plateaued on current dose of Mounjaro.  Increase Mounjaro to 7.5 mg.      -we discussed possible serious and likely etiologies, options for evaluation and workup, limitations of telemedicine visit vs in person visit, treatment, treatment risks and precautions. Pt prefers to treat via telemedicine empirically rather then risking or undertaking an in person visit at this moment.    I discussed the assessment and treatment plan with the patient. The patient was provided an opportunity to ask questions and all were answered. The patient agreed with the plan and demonstrated an understanding of the instructions.   The patient was advised to call back  or seek an in-person evaluation if the symptoms worsen or if the condition fails to improve as anticipated.  Advised if desired AVS can be mailed or viewed via MyChart if Mychart user.   Rennie Plowman, FNP

## 2023-02-12 NOTE — Patient Instructions (Addendum)
Start prozac  You are no longer on paxil  Increase mounjaro 7.5mg  once weekly   Nice seeing you today!

## 2023-02-13 ENCOUNTER — Telehealth: Payer: Self-pay | Admitting: Family

## 2023-02-13 NOTE — Telephone Encounter (Signed)
Call pt to schedule her 4 months f/u with provider and fasting labs in 2-3 weeks. As per pt, she will call back to make appts. As per pt, she does not have transportation to come in for her appts.

## 2023-02-14 NOTE — Assessment & Plan Note (Signed)
Hot flashes have resolved.  She is no longer on Paxil.  Start Prozac 20 mg and titrate.  Patient will let me know how she is doing.

## 2023-02-14 NOTE — Assessment & Plan Note (Signed)
Congratulated patient on weight loss.  She is plateaued on current dose of Mounjaro.  Increase Mounjaro to 7.5 mg.

## 2023-02-19 ENCOUNTER — Ambulatory Visit: Payer: 59

## 2023-02-19 NOTE — Progress Notes (Deleted)
Subjective:   Jacqueline Delgado is a 72 y.o. female who presents for Medicare Annual (Subsequent) preventive examination.  Review of Systems    ***       Objective:    There were no vitals filed for this visit. There is no height or weight on file to calculate BMI.     01/09/2023    8:43 AM 11/26/2022    8:45 AM 11/06/2022    8:11 AM 09/13/2022    1:44 AM 05/23/2022    8:43 AM 05/03/2022    9:24 AM 04/22/2022    3:05 PM  Advanced Directives  Does Patient Have a Medical Advance Directive? No No No No No No No  Would patient like information on creating a medical advance directive? No - Patient declined  No - Patient declined No - Patient declined       Current Medications (verified) Outpatient Encounter Medications as of 02/19/2023  Medication Sig  . ACIPHEX 20 MG tablet TAKE 1 TABLET (20 MG TOTAL) BY MOUTH IN THE MORNING AND AT BEDTIME.(DAW 1-BRAND NAME)  . famotidine (PEPCID) 40 MG tablet Take 40 mg by mouth at bedtime.  Marland Kitchen FLUoxetine (PROZAC) 20 MG capsule Take 1 capsule (20 mg total) by mouth every morning.  . fluticasone (FLONASE) 50 MCG/ACT nasal spray PLACE 2 SPRAYS INTO BOTH NOSTRILS DAILY AS NEEDED FOR ALLERGIES OR RHINITIS.  . hydrochlorothiazide (HYDRODIURIL) 25 MG tablet TAKE 1 TAB DAILY. PLEASE CONTACT THE OFFICE FOR A FOLLOW UP APPOINTMENT WITH DR. AGBOR-ETANG.  . lidocaine (LIDODERM) 5 % Place 1 patch onto the skin daily. Remove & Discard patch within 12 hours or as directed by MD  . losartan (COZAAR) 50 MG tablet TAKE 1 TABLET BY MOUTH EVERY DAY  . ondansetron (ZOFRAN-ODT) 4 MG disintegrating tablet TAKE 1 TABLET BY MOUTH EVERY 8 HOURS AS NEEDED FOR NAUSEA AND VOMITING  . potassium chloride (KLOR-CON M) 10 MEQ tablet Take 1 tablet (10 mEq total) by mouth daily.  . promethazine (PHENERGAN) 25 MG tablet Take 25 mg by mouth every 8 (eight) hours as needed.  . tirzepatide (MOUNJARO) 7.5 MG/0.5ML Pen Inject 7.5 mg into the skin once a week.   No facility-administered  encounter medications on file as of 02/19/2023.    Allergies (verified) Patient has no known allergies.   History: Past Medical History:  Diagnosis Date  . Anemia    H/O  . Anginal pain (HCC)     3/8-12/21/10  . Arthritis    Osteoarthritis in BLE knee  . Colon polyp   . GERD (gastroesophageal reflux disease)   . Hiatal hernia   . History of hiatal hernia   . History of methicillin resistant staphylococcus aureus (MRSA)   . Hypertension    controlled  . Obesity   . Pre-diabetes   . Reflux   . Sleep apnea    NO CPAP-SLEEP STUDY FROM 2021 WAS NEGATIVE PER PT  . Small vessel disease [facility])    Past Surgical History:  Procedure Laterality Date  . ABDOMINAL HYSTERECTOMY     partial; per patient has left ovary  . BREAST SURGERY    . CHOLECYSTECTOMY    . COLONOSCOPY W/ POLYPECTOMY     adenomatous colon polyp  . COLONOSCOPY WITH PROPOFOL N/A 12/10/2017   Procedure: COLONOSCOPY WITH PROPOFOL;  Surgeon: Scot Jun, MD;  Location: [facility] ENDOSCOPY;  Service: Endoscopy;  Laterality: N/A;  . ESOPHAGOGASTRODUODENOSCOPY (EGD) WITH PROPOFOL N/A 10/11/2015   Procedure: ESOPHAGOGASTRODUODENOSCOPY (EGD) WITH PROPOFOL;  Surgeon: Molly Maduro  Daron Offer, MD;  Location: [facility] ENDOSCOPY;  Service: Endoscopy;  Laterality: N/A;  . ESOPHAGOGASTRODUODENOSCOPY (EGD) WITH PROPOFOL N/A 12/10/2017   Procedure: ESOPHAGOGASTRODUODENOSCOPY (EGD) WITH PROPOFOL;  Surgeon: Scot Jun, MD;  Location: [facility] ENDOSCOPY;  Service: Endoscopy;  Laterality: N/A;  . ESOPHAGOGASTRODUODENOSCOPY (EGD) WITH PROPOFOL N/A 05/03/2022   Procedure: ESOPHAGOGASTRODUODENOSCOPY (EGD) WITH PROPOFOL;  Surgeon: Regis Bill, MD;  Location: [facility] ENDOSCOPY;  Service: Endoscopy;  Laterality: N/A;  . JOINT REPLACEMENT Right    knee, Oct 2012  . JOINT REPLACEMENT     hopefully getting left partial knee replacement in July 2016  . JOINT REPLACEMENT Left    03/06/21  . SAVORY DILATION N/A 10/11/2015   Procedure: SAVORY DILATION;   Surgeon: Scot Jun, MD;  Location: [facility] ENDOSCOPY;  Service: Endoscopy;  Laterality: N/A;  . SHOULDER ARTHROSCOPY WITH ROTATOR CUFF REPAIR AND SUBACROMIAL DECOMPRESSION Right 06/14/2015   Procedure: SHOULDER ARTHROSCOPY WITH mini open ROTATOR CUFF REPAIR AND SUBACROMIAL DECOMPRESSION, release long head biceps tendon.;  Surgeon: Erin Sons, MD;  Location: [facility] ORS;  Service: Orthopedics;  Laterality: Right;  . TOTAL KNEE ARTHROPLASTY Left 03/06/2021   Procedure: TOTAL KNEE ARTHROPLASTY - [place] Neighbor to Assist;  Surgeon: Kennedy Bucker, MD;  Location: [facility] ORS;  Service: Orthopedics;  Laterality: Left;   Family History  Problem Relation Age of Onset  . Breast cancer Mother 49  . Hyperlipidemia Father   . Heart disease Father   . Stroke Father   . Hypertension Father   . Breast cancer Maternal Grandmother        young  . Alcoholism Other        brother  . Arthritis Other        parent  . Diabetes Other        parent and other family member  . Thyroid cancer Neg Hx    Social History   Socioeconomic History  . Marital status: Single    Spouse name: Not on file  . Number of children: Not on file  . Years of education: Not on file  . Highest education level: Not on file  Occupational History  . Not on file  Tobacco Use  . Smoking status: Never  . Smokeless tobacco: Never  Vaping Use  . Vaping Use: Never used  Substance and Sexual Activity  . Alcohol use: No  . Drug use: No  . Sexual activity: Not on file  Other Topics Concern  . Not on file  Social History Narrative   Lost a son on march 8th 20+ years ago.    March through Mother's Day is a very hard time every year.    Works with [employer] until 09/2020   Social Determinants of Health   Financial Resource Strain: Low Risk  (01/09/2022)   Overall Financial Resource Strain (CARDIA)   . Difficulty of Paying Living Expenses: Not hard at all  Food Insecurity: No Food Insecurity (01/09/2022)   Hunger Vital Sign    . Worried About Programme researcher, broadcasting/film/video in the Last Year: Never true   . Ran Out of Food in the Last Year: Never true  Transportation Needs: No Transportation Needs (01/09/2022)   PRAPARE - Transportation   . Lack of Transportation (Medical): No   . Lack of Transportation (Non-Medical): No  Physical Activity: Unknown (01/09/2022)   Exercise Vital Sign   . Days of Exercise per Week: 0 days   . Minutes of Exercise per Session: Not on file  Stress: Stress Concern Present (  01/09/2022)   [employer] of Occupational Health - Occupational Stress Questionnaire   . Feeling of Stress : To some extent  Social Connections: Unknown (01/09/2022)   Social Connection and Isolation Panel [NHANES]   . Frequency of Communication with Friends and Family: More than three times a week   . Frequency of Social Gatherings with Friends and Family: More than three times a week   . Attends Religious Services: More than 4 times per year   . Active Member of Clubs or Organizations: Not on file   . Attends Banker Meetings: Not on file   . Marital Status: Not on file    Tobacco Counseling Counseling given: Not Answered   Clinical Intake:                 Diabetic?***         Activities of Daily Living     No data to display           Patient Care Team: Allegra Grana, FNP as PCP - General (Family Medicine) Debbe Odea, MD as PCP - Cardiology (Cardiology)  Indicate any recent Medical Services you may have received from other than [facility] providers in the past year (date may be approximate).     Assessment:   This is a routine wellness examination for Faisa.  Hearing/Vision screen No results found.  Dietary issues and exercise activities discussed:     Goals Addressed   None   Depression Screen    02/12/2023    2:42 PM 01/09/2023    8:43 AM 01/01/2023    3:27 PM 12/12/2022    1:33 PM 11/26/2022    8:44 AM 11/06/2022    8:11 AM 10/21/2022   11:36 AM  PHQ 2/9  Scores  PHQ - 2 Score 1 0 0 0 0 1 4  PHQ- 9 Score 4      14    Fall Risk    02/12/2023    2:40 PM 01/09/2023    8:43 AM 01/01/2023    3:27 PM 12/12/2022    1:33 PM 11/26/2022    8:44 AM  Fall Risk   Falls in the past year? 0 0 0 0 0  Number falls in past yr: 0 0 0    Injury with Fall? 0 0 0    Risk for fall due to : No Fall Risks  No Fall Risks    Follow up Falls evaluation completed  Falls evaluation completed      FALL RISK PREVENTION PERTAINING TO THE HOME:  Any stairs in or around the home? {YES/NO:21197} If so, are there any without handrails? {YES/NO:21197} Home free of loose throw rugs in walkways, pet beds, electrical cords, etc? {YES/NO:21197} Adequate lighting in your home to reduce risk of falls? {YES/NO:21197}  ASSISTIVE DEVICES UTILIZED TO PREVENT FALLS:  Life alert? {YES/NO:21197} Use of a cane, walker or w/c? {YES/NO:21197} Grab bars in the bathroom? {YES/NO:21197} Shower chair or bench in shower? {YES/NO:21197} Elevated toilet seat or a handicapped toilet? {YES/NO:21197}  TIMED UP AND GO:  Was the test performed? {YES/NO:21197}.  Length of time to ambulate 10 feet: *** sec.   {Appearance of MVHQ:4696295}  Cognitive Function:        05/13/2019    3:36 PM  6CIT Screen  What Year? 0 points  What month? 0 points  What time? 0 points  Count back from 20 0 points  Months in reverse 0 points    Immunizations Immunization History  Administered Date(s) Administered  . Fluad Quad(high Dose 65+) 06/20/2021  . Influenza Whole 05/23/2018  . PFIZER Comirnaty(Gray Top)Covid-19 Tri-Sucrose Vaccine 03/07/2021, 11/13/2021  . Pneumococcal Conjugate-13 12/28/2019  . Tdap 11/02/2021  . Unspecified SARS-COV-2 Vaccination 03/07/2021    {TDAP status:2101805}  {Flu Vaccine status:2101806}  {Pneumococcal vaccine status:2101807}  {Covid-19 vaccine status:2101808}  Qualifies for Shingles Vaccine? {YES/NO:21197}  Zostavax completed {YES/NO:21197}  {Shingrix  Completed?:2101804}  Screening Tests Health Maintenance  Topic Date Due  . Zoster Vaccines- Shingrix (1 of 2) Never done  . INFLUENZA VACCINE  05/15/2023  . Medicare Annual Wellness (AWV)  02/19/2024  . MAMMOGRAM  07/22/2024  . COLONOSCOPY (Pts 45-35yrs Insurance coverage will need to be confirmed)  12/11/2027  . DTaP/Tdap/Td (2 - Td or Tdap) 11/03/2031  . DEXA SCAN  Completed  . Hepatitis C Screening  Completed  . HPV VACCINES  Aged Out  . Pneumonia Vaccine 110+ Years old  Discontinued  . COVID-19 Vaccine  Discontinued    Health Maintenance  Health Maintenance Due  Topic Date Due  . Zoster Vaccines- Shingrix (1 of 2) Never done    {Colorectal cancer screening:2101809}  {Mammogram status:21018020}  {Bone Density status:21018021}  Lung Cancer Screening: (Low Dose CT Chest recommended if Age 8-80 years, 30 pack-year currently smoking OR have quit w/in 15years.) {DOES NOT does:27190::"does not"} qualify.   Lung Cancer Screening Referral: ***  Additional Screening:  Hepatitis C Screening: {DOES NOT does:27190::"does not"} qualify; Completed ***  Vision Screening: Recommended annual ophthalmology exams for early detection of glaucoma and other disorders of the eye. Is the patient up to date with their annual eye exam?  {YES/NO:21197} Who is the provider or what is the name of the office in which the patient attends annual eye exams? *** If pt is not established with a provider, would they like to be referred to a provider to establish care? {YES/NO:21197}.   Dental Screening: Recommended annual dental exams for proper oral hygiene  Community Resource Referral / Chronic Care Management: CRR required this visit?  {YES/NO:21197}  CCM required this visit?  {YES/NO:21197}     Plan:     I have personally reviewed and noted the following in the patient's chart:   Medical and social history Use of alcohol, tobacco or illicit drugs  Current medications and supplements  including opioid prescriptions. {Opioid Prescriptions:[phone]} Functional ability and status Nutritional status Physical activity Advanced directives List of other physicians Hospitalizations, surgeries, and ER visits in previous 12 months Vitals Screenings to include cognitive, depression, and falls Referrals and appointments  In addition, I have reviewed and discussed with patient certain preventive protocols, quality metrics, and best practice recommendations. A written personalized care plan for preventive services as well as general preventive health recommendations were provided to patient.     Annabell Sabal, CMA   02/19/2023   Nurse Notes: ***

## 2023-02-21 ENCOUNTER — Other Ambulatory Visit: Payer: Self-pay | Admitting: Family

## 2023-02-21 MED ORDER — VITAMIN D3 25 MCG (1000 UT) PO CAPS: 1000.0000 [IU] | ORAL_CAPSULE | Freq: Every day | ORAL | 1 refills | Status: DC

## 2023-02-21 NOTE — Progress Notes (Signed)
close

## 2023-02-24 ENCOUNTER — Other Ambulatory Visit: Payer: Self-pay | Admitting: Family

## 2023-02-24 ENCOUNTER — Other Ambulatory Visit: Payer: 59 | Admitting: Pharmacist

## 2023-02-24 NOTE — Progress Notes (Signed)
Pharmacy Quality Measure Review  This patient is appearing on the insurance-provided list for being at risk of failing the adherence measure for diabetes medications this calendar year. She does not have a diagnosis of diabetes, but has been prescribed Mounjaro.   Medication: Mounjaro 7.5 mg weekly Last fill date: 02/12/23 - 3 month supply  Noted that patient has not filled losartan 50 mg daily since 2023. Discussed with patient. She notes she had a lot at home, and will continue to complete that supply before filling at the pharmacy this year.   Since she is filling Mounjaro (even though she does not have diabetes), she is in the SUPD measure. However, PCP used pre-diabetes ICD code with last visit which should exclude her from the measure. Moderate or high intensity statin therapy could be considered given below risk score:   PREVENT Risk Score: 10 year risk of CVD: 15% - 10 year risk of ASCVD: 7.5% - 10 year risk of HF: 10.8%   Encouraged patient to schedule follow up with PCP.   Catie Eppie Gibson, PharmD, BCACP, CPP [facility] [phone]

## 2023-03-04 ENCOUNTER — Ambulatory Visit (INDEPENDENT_AMBULATORY_CARE_PROVIDER_SITE_OTHER): Payer: 59

## 2023-03-04 DIAGNOSIS — Z0000 Encounter for general adult medical examination without abnormal findings: Secondary | ICD-10-CM | POA: Diagnosis not present

## 2023-03-04 DIAGNOSIS — Z599 Problem related to housing and economic circumstances, unspecified: Secondary | ICD-10-CM

## 2023-03-04 NOTE — Progress Notes (Signed)
I connected with  Jacqueline Delgado on 03/04/23 by a audio enabled telemedicine application and verified that I am speaking with the correct person using two identifiers.  Patient Location: Home  Provider Location: Home Office  I discussed the limitations of evaluation and management by telemedicine. The patient expressed understanding and agreed to proceed.   Subjective:   Jacqueline Delgado is a 72 y.o. female who presents for Medicare Annual (Subsequent) preventive examination.  Review of Systems    Per HPI unless specifically indicated below.  Cardiac Risk Factors include: advanced age (>39men, >3 women);female gender          Objective:    Today's Vitals   03/04/23 1507  PainSc: 7    There is no height or weight on file to calculate BMI.     01/09/2023    8:43 AM 11/26/2022    8:45 AM 11/06/2022    8:11 AM 09/13/2022    1:44 AM 05/23/2022    8:43 AM 05/03/2022    9:24 AM 04/22/2022    3:05 PM  Advanced Directives  Does Patient Have a Medical Advance Directive? No No No No No No No  Would patient like information on creating a medical advance directive? No - Patient declined  No - Patient declined No - Patient declined       Current Medications (verified) Outpatient Encounter Medications as of 03/04/2023  Medication Sig   ACIPHEX 20 MG tablet TAKE 1 TABLET (20 MG TOTAL) BY MOUTH IN THE MORNING AND AT BEDTIME.(DAW 1-BRAND NAME)   cetirizine (ZYRTEC) 10 MG tablet Take 10 mg by mouth daily.   Cholecalciferol (VITAMIN D3) 25 MCG (1000 UT) CAPS Take 1 capsule (1,000 Units total) by mouth daily.   famotidine (PEPCID) 40 MG tablet Take 40 mg by mouth at bedtime.   fluticasone (FLONASE) 50 MCG/ACT nasal spray PLACE 2 SPRAYS INTO BOTH NOSTRILS DAILY AS NEEDED FOR ALLERGIES OR RHINITIS.   hydrochlorothiazide (HYDRODIURIL) 25 MG tablet TAKE 1 TAB DAILY. PLEASE CONTACT THE OFFICE FOR A FOLLOW UP APPOINTMENT WITH DR. AGBOR-ETANG.   losartan (COZAAR) 50 MG tablet TAKE 1 TABLET BY  MOUTH EVERY DAY   ondansetron (ZOFRAN-ODT) 4 MG disintegrating tablet TAKE 1 TABLET BY MOUTH EVERY 8 HOURS AS NEEDED FOR NAUSEA AND VOMITING   potassium chloride (KLOR-CON M) 10 MEQ tablet Take 1 tablet (10 mEq total) by mouth daily.   tirzepatide (MOUNJARO) 7.5 MG/0.5ML Pen Inject 7.5 mg into the skin once a week.   [DISCONTINUED] FLUoxetine (PROZAC) 20 MG capsule Take 1 capsule (20 mg total) by mouth every morning. (Patient not taking: Reported on 03/04/2023)   [DISCONTINUED] lidocaine (LIDODERM) 5 % Place 1 patch onto the skin daily. Remove & Discard patch within 12 hours or as directed by MD (Patient not taking: Reported on 03/04/2023)   [DISCONTINUED] promethazine (PHENERGAN) 25 MG tablet Take 25 mg by mouth every 8 (eight) hours as needed. (Patient not taking: Reported on 03/04/2023)   No facility-administered encounter medications on file as of 03/04/2023.    Allergies (verified) Patient has no known allergies.   History: Past Medical History:  Diagnosis Date   Anemia    H/O   Anginal pain (HCC)     3/8-12/21/10   Arthritis    Osteoarthritis in BLE knee   Colon polyp    GERD (gastroesophageal reflux disease)    Hiatal hernia    History of hiatal hernia    History of methicillin resistant staphylococcus aureus (MRSA)  Hypertension    controlled   Obesity    Pre-diabetes    Reflux    Sleep apnea    NO CPAP-SLEEP STUDY FROM 2021 WAS NEGATIVE PER PT   Small vessel disease (HCC)    Past Surgical History:  Procedure Laterality Date   ABDOMINAL HYSTERECTOMY     partial; per patient has left ovary   BREAST SURGERY     CHOLECYSTECTOMY     COLONOSCOPY W/ POLYPECTOMY     adenomatous colon polyp   COLONOSCOPY WITH PROPOFOL N/A 12/10/2017   Procedure: COLONOSCOPY WITH PROPOFOL;  Surgeon: Scot Jun, MD;  Location: [facility] ENDOSCOPY;  Service: Endoscopy;  Laterality: N/A;   ESOPHAGOGASTRODUODENOSCOPY (EGD) WITH PROPOFOL N/A 10/11/2015   Procedure: ESOPHAGOGASTRODUODENOSCOPY  (EGD) WITH PROPOFOL;  Surgeon: Scot Jun, MD;  Location: [facility] ENDOSCOPY;  Service: Endoscopy;  Laterality: N/A;   ESOPHAGOGASTRODUODENOSCOPY (EGD) WITH PROPOFOL N/A 12/10/2017   Procedure: ESOPHAGOGASTRODUODENOSCOPY (EGD) WITH PROPOFOL;  Surgeon: Scot Jun, MD;  Location: [facility] ENDOSCOPY;  Service: Endoscopy;  Laterality: N/A;   ESOPHAGOGASTRODUODENOSCOPY (EGD) WITH PROPOFOL N/A 05/03/2022   Procedure: ESOPHAGOGASTRODUODENOSCOPY (EGD) WITH PROPOFOL;  Surgeon: Regis Bill, MD;  Location: [facility] ENDOSCOPY;  Service: Endoscopy;  Laterality: N/A;   JOINT REPLACEMENT Right    knee, Oct 2012   JOINT REPLACEMENT     hopefully getting left partial knee replacement in July 2016   JOINT REPLACEMENT Left    03/06/21   SAVORY DILATION N/A 10/11/2015   Procedure: SAVORY DILATION;  Surgeon: Scot Jun, MD;  Location: [facility] ENDOSCOPY;  Service: Endoscopy;  Laterality: N/A;   SHOULDER ARTHROSCOPY WITH ROTATOR CUFF REPAIR AND SUBACROMIAL DECOMPRESSION Right 06/14/2015   Procedure: SHOULDER ARTHROSCOPY WITH mini open ROTATOR CUFF REPAIR AND SUBACROMIAL DECOMPRESSION, release long head biceps tendon.;  Surgeon: Erin Sons, MD;  Location: [facility] ORS;  Service: Orthopedics;  Laterality: Right;   TOTAL KNEE ARTHROPLASTY Left 03/06/2021   Procedure: TOTAL KNEE ARTHROPLASTY - [place] Neighbor to Assist;  Surgeon: Kennedy Bucker, MD;  Location: [facility] ORS;  Service: Orthopedics;  Laterality: Left;   Family History  Problem Relation Age of Onset   Breast cancer Mother 21   Hyperlipidemia Father    Heart disease Father    Stroke Father    Hypertension Father    Breast cancer Maternal Grandmother        young   Alcoholism Other        brother   Arthritis Other        parent   Diabetes Other        parent and other family member   Thyroid cancer Neg Hx    Social History   Socioeconomic History   Marital status: Widowed    Spouse name: Not on file   Number of children: 3   Years of  education: Not on file   Highest education level: Not on file  Occupational History   Occupation: Retired  Tobacco Use   Smoking status: Never   Smokeless tobacco: Never  Vaping Use   Vaping Use: Never used  Substance and Sexual Activity   Alcohol use: No   Drug use: No   Sexual activity: Not on file  Other Topics Concern   Not on file  Social History Narrative   Lost a son on march 8th 20+ years ago.    March through Mother's Day is a very hard time every year.    Works with [employer] until 09/2020   Social Determinants of Health  Financial Resource Strain: Medium Risk (03/04/2023)   Overall Financial Resource Strain (CARDIA)    Difficulty of Paying Living Expenses: Somewhat hard  Food Insecurity: Food Insecurity Present (03/04/2023)   Hunger Vital Sign    Worried About Running Out of Food in the Last Year: Often true    Ran Out of Food in the Last Year: Often true  Transportation Needs: Unmet Transportation Needs (03/04/2023)   PRAPARE - Administrator, Civil Service (Medical): Yes    Lack of Transportation (Non-Medical): Yes  Physical Activity: Insufficiently Active (03/04/2023)   Exercise Vital Sign    Days of Exercise per Week: 3 days    Minutes of Exercise per Session: 10 min  Stress: Stress Concern Present (03/04/2023)   [employer] of Occupational Health - Occupational Stress Questionnaire    Feeling of Stress : To some extent  Social Connections: Socially Isolated (03/04/2023)   Social Connection and Isolation Panel [NHANES]    Frequency of Communication with Friends and Family: More than three times a week    Frequency of Social Gatherings with Friends and Family: Once a week    Attends Religious Services: Never    Database administrator or Organizations: No    Attends Banker Meetings: Never    Marital Status: Widowed    Tobacco Counseling Counseling given: No   Clinical Intake:  Pre-visit preparation completed: No  Pain :  0-10 Pain Score: 7  Pain Type: Chronic pain Pain Location: Knee Pain Orientation: Left Pain Descriptors / Indicators: Tender, Tightness Pain Onset: More than a month ago Pain Frequency: Intermittent     Nutritional Status: BMI > 30  Obese Nutritional Risks: None Diabetes: No  How often do you need to have someone help you when you read instructions, pamphlets, or other written materials from your doctor or pharmacy?: 1 - Never  Diabetic?No  Interpreter Needed?: No  Information entered by :: Laurel Dimmer, CMA   Activities of Daily Living    03/04/2023    3:04 PM  In your present state of health, do you have any difficulty performing the following activities:  Hearing? 1  Vision? 1  Difficulty concentrating or making decisions? 0  Walking or climbing stairs? 0  Dressing or bathing? 0  Doing errands, shopping? 0    Patient Care Team: Allegra Grana, FNP as PCP - General (Family Medicine) Debbe Odea, MD as PCP - Cardiology (Cardiology)  Indicate any recent Medical Services you may have received from other than [facility] providers in the past year (date may be approximate).     Assessment:   This is a routine wellness examination for [place].  Hearing/Vision screen Complains of tinnitus, which causes some hearing issue.  Vision changes report. The pt is due for Eye exam, but state she cannot afford it.   Dietary issues and exercise activities discussed: Current Exercise Habits: Structured exercise class, Type of exercise: treadmill, Time (Minutes): 10, Frequency (Times/Week): 3, Weekly Exercise (Minutes/Week): 30, Intensity: Mild, Exercise limited by: orthopedic condition(s)   Goals Addressed   None    Depression Screen    03/04/2023    3:00 PM 02/12/2023    2:42 PM 01/09/2023    8:43 AM 01/01/2023    3:27 PM 12/12/2022    1:33 PM 11/26/2022    8:44 AM 11/06/2022    8:11 AM  PHQ 2/9 Scores  PHQ - 2 Score 5 1 0 0 0 0 1  PHQ- 9 Score 17 4  Fall  Risk    03/04/2023    3:03 PM 02/12/2023    2:40 PM 01/09/2023    8:43 AM 01/01/2023    3:27 PM 12/12/2022    1:33 PM  Fall Risk   Falls in the past year? 1 0 0 0 0  Number falls in past yr: 0 0 0 0   Injury with Fall? 0 0 0 0   Risk for fall due to : No Fall Risks No Fall Risks  No Fall Risks   Follow up Falls evaluation completed Falls evaluation completed  Falls evaluation completed     FALL RISK PREVENTION PERTAINING TO THE HOME:  Any stairs in or around the home? No  If so, are there any without handrails? No  Home free of loose throw rugs in walkways, pet beds, electrical cords, etc? Yes  Adequate lighting in your home to reduce risk of falls? Yes   ASSISTIVE DEVICES UTILIZED TO PREVENT FALLS:  Life alert? No  Use of a cane, walker or w/c? No  Grab bars in the bathroom? No  Shower chair or bench in shower? Yes  Elevated toilet seat or a handicapped toilet? No   TIMED UP AND GO:  Was the test performed? Unable to perform, virtual appointment  Cognitive Function:        03/04/2023    3:09 PM 05/13/2019    3:36 PM  6CIT Screen  What Year? 0 points 0 points  What month? 0 points 0 points  What time? 0 points 0 points  Count back from 20 0 points 0 points  Months in reverse 0 points 0 points  Repeat phrase 0 points   Total Score 0 points     Immunizations Immunization History  Administered Date(s) Administered   Fluad Quad(high Dose 65+) 06/20/2021   Influenza Whole 05/23/2018   PFIZER Comirnaty(Gray Top)Covid-19 Tri-Sucrose Vaccine 03/07/2021, 11/13/2021   Pneumococcal Conjugate-13 12/28/2019   Tdap 11/02/2021   Unspecified SARS-COV-2 Vaccination 03/07/2021    TDAP status: Up to date  Flu Vaccine status: Up to date  Pneumococcal vaccine status: Up to date  Covid-19 vaccine status: Declined, Education has been provided regarding the importance of this vaccine but patient still declined. Advised may receive this vaccine at local pharmacy or Health Dept.or  vaccine clinic. Aware to provide a copy of the vaccination record if obtained from local pharmacy or Health Dept. Verbalized acceptance and understanding.  Qualifies for Shingles Vaccine? Yes   Zostavax completed No   Shingrix Completed?: No.    Education has been provided regarding the importance of this vaccine. Patient has been advised to call insurance company to determine out of pocket expense if they have not yet received this vaccine. Advised may also receive vaccine at local pharmacy or Health Dept. Verbalized acceptance and understanding.  Screening Tests Health Maintenance  Topic Date Due   Zoster Vaccines- Shingrix (1 of 2) Never done   INFLUENZA VACCINE  05/15/2023   Medicare Annual Wellness (AWV)  03/03/2024   MAMMOGRAM  07/22/2024   COLONOSCOPY (Pts 45-91yrs Insurance coverage will need to be confirmed)  12/11/2027   DTaP/Tdap/Td (2 - Td or Tdap) 11/03/2031   DEXA SCAN  Completed   Hepatitis C Screening  Completed   HPV VACCINES  Aged Out   Pneumonia Vaccine 65+ Years old  Discontinued   COVID-19 Vaccine  Discontinued    Health Maintenance  Health Maintenance Due  Topic Date Due   Zoster Vaccines- Shingrix (1 of 2) Never  done    Colorectal cancer screening: Type of screening: Colonoscopy. Completed 12/10/2017. Repeat every 10 years  Mammogram status: Completed 07/22/2022. Repeat every year  DEXA Scan: 07/22/2022  Lung Cancer Screening: (Low Dose CT Chest recommended if Age 67-80 years, 30 pack-year currently smoking OR have quit w/in 15years.) does not qualify.   Lung Cancer Screening Referral: not appicable   Additional Screening:  Hepatitis C Screening: does qualify; Completed 07/13/21  Vision Screening: Recommended annual ophthalmology exams for early detection of glaucoma and other disorders of the eye. Is the patient up to date with their annual eye exam?  No Who is the provider or what is the name of the office in which the patient attends annual eye  exams? Overdue for Annual Exam If pt is not established with a provider, would they like to be referred to a provider to establish care? No .   Dental Screening: Recommended annual dental exams for proper oral hygiene  Community Resource Referral / Chronic Care Management: CRR required this visit?  No   CCM required this visit?  No      Plan:     I have personally reviewed and noted the following in the patient's chart:   Medical and social history Use of alcohol, tobacco or illicit drugs  Current medications and supplements including opioid prescriptions. Patient is not currently taking opioid prescriptions. Functional ability and status Nutritional status Physical activity Advanced directives List of other physicians Hospitalizations, surgeries, and ER visits in previous 12 months Vitals Screenings to include cognitive, depression, and falls Referrals and appointments  In addition, I have reviewed and discussed with patient certain preventive protocols, quality metrics, and best practice recommendations. A written personalized care plan for preventive services as well as general preventive health recommendations were provided to patient.     Ms. Hower , Thank you for taking time to come for your Medicare Wellness Visit. I appreciate your ongoing commitment to your health goals. Please review the following plan we discussed and let me know if I can assist you in the future.   These are the goals we discussed:  Goals       Increase physical activity (pt-stated)      Use the treadmill for exercise        This is a list of the screening recommended for you and due dates:  Health Maintenance  Topic Date Due   Zoster (Shingles) Vaccine (1 of 2) Never done   Flu Shot  05/15/2023   Medicare Annual Wellness Visit  03/03/2024   Mammogram  07/22/2024   Colon Cancer Screening  12/11/2027   DTaP/Tdap/Td vaccine (2 - Td or Tdap) 11/03/2031   DEXA scan (bone density  measurement)  Completed   Hepatitis C Screening: USPSTF Recommendation to screen - Ages 92-79 yo.  Completed   HPV Vaccine  Aged Out   Pneumonia Vaccine  Discontinued   COVID-19 Vaccine  Discontinued    Lonna Cobb, [facility]   03/04/2023   Nurse Notes: Approximately 30 minute Non-Face -To-Face Medicare Wellness Visit

## 2023-03-04 NOTE — Patient Instructions (Signed)

## 2023-03-05 ENCOUNTER — Telehealth: Payer: Self-pay

## 2023-03-05 NOTE — Telephone Encounter (Signed)
   Telephone encounter was:  Successful.  03/05/2023 Name: [name] MRN: [id number] DOB: [birth year]  Jacqueline Delgado is a 72 y.o. year old female who is a primary care patient of Allegra Grana, FNP . The community resource team was consulted for assistance with Transportation Needs , Food Insecurity, and Financial Difficulties related to Financial Strain  Care guide performed the following interventions: Patient provided with information about care guide support team and interviewed to confirm resource needs.Patient has financial strain and cant afford food and to pay her bills. Patient has no transportation and cant get out to buy food I am mailing resources and have set her up with [facility] to see if they can assist.   Follow Up Plan:  No further follow up planned at this time. The patient has been provided with needed resources.    Lenard Forth [facility] Guide, [facility] [phone] [address] [place], [place], [place] [address] Phone: [phone] Email: [email or web address]

## 2023-03-25 ENCOUNTER — Other Ambulatory Visit (INDEPENDENT_AMBULATORY_CARE_PROVIDER_SITE_OTHER): Payer: 59

## 2023-03-25 DIAGNOSIS — Z13220 Encounter for screening for lipoid disorders: Secondary | ICD-10-CM | POA: Diagnosis not present

## 2023-03-25 DIAGNOSIS — R7303 Prediabetes: Secondary | ICD-10-CM | POA: Diagnosis not present

## 2023-03-25 LAB — LIPID PANEL
Cholesterol: 142 mg/dL (ref 0–200)
HDL: 43 mg/dL
LDL Cholesterol: 81 mg/dL (ref 0–99)
NonHDL: 99.35
Total CHOL/HDL Ratio: 3
Triglycerides: 91 mg/dL (ref 0.0–149.0)
VLDL: 18.2 mg/dL (ref 0.0–40.0)

## 2023-03-25 LAB — HEMOGLOBIN A1C: Hgb A1c MFr Bld: 5.4 % (ref 4.6–6.5)

## 2023-03-26 ENCOUNTER — Ambulatory Visit (INDEPENDENT_AMBULATORY_CARE_PROVIDER_SITE_OTHER): Payer: 59 | Admitting: Family

## 2023-03-26 ENCOUNTER — Encounter: Payer: Self-pay | Admitting: Family

## 2023-03-26 ENCOUNTER — Other Ambulatory Visit: Payer: Self-pay | Admitting: Family

## 2023-03-26 DIAGNOSIS — I10 Essential (primary) hypertension: Secondary | ICD-10-CM

## 2023-03-26 DIAGNOSIS — H9311 Tinnitus, right ear: Secondary | ICD-10-CM

## 2023-03-26 DIAGNOSIS — R42 Dizziness and giddiness: Secondary | ICD-10-CM | POA: Diagnosis not present

## 2023-03-26 DIAGNOSIS — Z6841 Body Mass Index (BMI) 40.0 and over, adult: Secondary | ICD-10-CM

## 2023-03-26 LAB — CBC WITH DIFFERENTIAL/PLATELET
Basophils Absolute: 0 10*3/uL (ref 0.0–0.1)
Basophils Relative: 0.3 % (ref 0.0–3.0)
Eosinophils Absolute: 0.2 10*3/uL (ref 0.0–0.7)
Eosinophils Relative: 4 % (ref 0.0–5.0)
HCT: 38 % (ref 36.0–46.0)
Hemoglobin: 12.3 g/dL (ref 12.0–15.0)
Lymphocytes Relative: 40.5 % (ref 12.0–46.0)
Lymphs Abs: 1.7 10*3/uL (ref 0.7–4.0)
MCHC: 32.4 g/dL (ref 30.0–36.0)
MCV: 81.9 fl (ref 78.0–100.0)
Monocytes Absolute: 0.4 10*3/uL (ref 0.1–1.0)
Monocytes Relative: 9.8 % (ref 3.0–12.0)
Neutro Abs: 1.9 10*3/uL (ref 1.4–7.7)
Neutrophils Relative %: 45.4 % (ref 43.0–77.0)
Platelets: 228 10*3/uL (ref 150.0–400.0)
RBC: 4.64 Mil/uL (ref 3.87–5.11)
RDW: 15.7 % — ABNORMAL HIGH (ref 11.5–15.5)
WBC: 4.1 10*3/uL (ref 4.0–10.5)

## 2023-03-26 LAB — COMPREHENSIVE METABOLIC PANEL
ALT: 14 U/L (ref 0–35)
AST: 18 U/L (ref 0–37)
Albumin: 4.2 g/dL (ref 3.5–5.2)
Alkaline Phosphatase: 86 U/L (ref 39–117)
BUN: 12 mg/dL (ref 6–23)
CO2: 29 mEq/L (ref 19–32)
Calcium: 10 mg/dL (ref 8.4–10.5)
Chloride: 103 mEq/L (ref 96–112)
Creatinine, Ser: 1.13 mg/dL (ref 0.40–1.20)
GFR: 49.08 mL/min — ABNORMAL LOW (ref >60.00)
Glucose, Bld: 80 mg/dL (ref 70–99)
Potassium: 3.1 mEq/L — ABNORMAL LOW (ref 3.5–5.1)
Sodium: 141 mEq/L (ref 135–145)
Total Bilirubin: 0.5 mg/dL (ref 0.2–1.2)
Total Protein: 7.6 g/dL (ref 6.0–8.3)

## 2023-03-26 LAB — TSH: TSH: 1.96 u[IU]/mL (ref 0.35–5.50)

## 2023-03-26 MED ORDER — HYDROCHLOROTHIAZIDE 25 MG PO TABS
12.5000 mg | ORAL_TABLET | Freq: Every day | ORAL | 3 refills | Status: DC
Start: 2023-03-26 — End: 2023-06-02

## 2023-03-26 MED ORDER — TIRZEPATIDE 10 MG/0.5ML ~~LOC~~ SOAJ
10.0000 mg | SUBCUTANEOUS | 3 refills | Status: DC
Start: 2023-03-26 — End: 2023-09-09

## 2023-03-26 NOTE — Progress Notes (Signed)
Assessment & Plan:  Morbid obesity with BMI of 40.0-44.9, adult Jacqueline Delgado) Assessment & Plan: Congratulated patient on weight loss thus far.  Weight loss has plateaued.  Increase Mounjaro to 10 mg.  Counseled on blackbox warning as relates to multiple endocrine neoplasia, thyroid cancer.  Reiterated again side effects including constipation and to stay vigilant for this with dose increase,  Orders: -     Tirzepatide; Inject 10 mg into the skin once a week.  Dispense: 6 mL; Refill: 3  Essential hypertension -     Comprehensive metabolic panel -     CBC with Differential/Platelet -     TSH  Dizziness Assessment & Plan: Reassuring neurologic exam.  Unable to elicit vertigo.  No dizziness during exam today.  Symptom is episodic.  Improved with hydration.  Discussed weight loss and in particular blood pressure medications including losartan 50 mg daily, hydrochlorothiazide 25 mg daily.  Discussed increased hydration and trial decrease hydrochlorothiazide 25 to 12.5 mg daily or every other day to see if symptom improves.  Close follow-up in 1 week.  Plan ultrasound carotid, MRI brain due to known prior h/o mild chronic microvascular ischemic changes seen prior MRI brain if dizziness persists.    Right-sided tinnitus Assessment & Plan: MR brain IAC Dr Andee Poles , right tinnitus, dizziness Dedicated posterior fossa study demonstrates no evidence for retrocochlear lesion.   Normal cerebral volume with mild chronic microvascular ischemic change.   Empty sella, with expansion of the bony confines; see discussion above. This appearance appears stable as far back as 2008.   Incidental 1.5 x 2 cm developmental venous anomaly in the RIGHT thalamus and surrounding white matter, stable from 2015.  Consult with Dr Sherryll Burger  06/07/2016 for right ear hissing, negative MRV head.   Chronic , stable. Reviewed prior evaluation. Will follow as long as stable.        Return precautions given.   Risks, benefits,  and alternatives of the medications and treatment plan prescribed today were discussed, and patient expressed understanding.   Education regarding symptom management and diagnosis given to patient on AVS either electronically or printed.  Return in about 1 week (around 04/02/2023).  Rennie Plowman, FNP  Subjective:    Patient ID: Jacqueline Delgado, female    DOB: Mar 28, 1951, 72 y.o.   MRN: [id number]  CC: Jacqueline Delgado is a 72 y.o. female who presents today for an acute visit.    HPI: Complains of dizziness x 3 weeks, somewhat better this week. Episodic.  Aggravated when lays flat in the bed and she feels 'swimmy headed'.   Worse when 'frustrated, angry' as when she is frustrated with granddaughter. Describes bending forward for a prolonged period of time which can aggravate.  She thinks dehydration was playing a role.  She is increased water intake this week and has felt better.  Previously described dark-colored urine.  Endorses sneezing, PND , right tinnitus which is chronic she is compliant with Zyrtec.Marland Kitchen   No associated CP, syncope,  fever, nasal congestion, SOB, vision loss, hearing loss, ear pain, nausea  She had had vertigo in the past however the room is not spinning at this time. This occurred 15+ years ago.   She has lost 16 lbs. Tolerating mounjaro. Weight loss has plateaued.         Sleep study 11/23/2019, dx hypersomnia; no treatment of sleep disordered breathing.  Per telephone note, no further follow-up needed 11/24/19   MR brain IAC Dr Andee Poles , right tinnitus,  dizziness Dedicated posterior fossa study demonstrates no evidence for retrocochlear lesion.   Normal cerebral volume with mild chronic microvascular ischemic change.   Empty sella, with expansion of the bony confines; see discussion above. This appearance appears stable as far back as 2008.   Incidental 1.5 x 2 cm developmental venous anomaly in the RIGHT thalamus and surrounding white matter, stable from  2015.  Consult with Dr Sherryll Burger  06/07/2016 for right ear hissing, negative MRV head.     Allergies: Patient has no known allergies. Current Outpatient Medications on File Prior to Visit  Medication Sig Dispense Refill   ACIPHEX 20 MG tablet TAKE 1 TABLET (20 MG TOTAL) BY MOUTH IN THE MORNING AND AT BEDTIME.(DAW 1-BRAND NAME) 180 tablet 3   cetirizine (ZYRTEC) 10 MG tablet Take 10 mg by mouth daily.     Cholecalciferol (VITAMIN D3) 25 MCG (1000 UT) CAPS Take 1 capsule (1,000 Units total) by mouth daily. 90 capsule 1   famotidine (PEPCID) 40 MG tablet Take 40 mg by mouth at bedtime.     fluticasone (FLONASE) 50 MCG/ACT nasal spray PLACE 2 SPRAYS INTO BOTH NOSTRILS DAILY AS NEEDED FOR ALLERGIES OR RHINITIS. 48 mL 1   hydrochlorothiazide (HYDRODIURIL) 25 MG tablet TAKE 1 TAB DAILY. PLEASE CONTACT THE OFFICE FOR A FOLLOW UP APPOINTMENT WITH DR. AGBOR-ETANG. 90 tablet 3   losartan (COZAAR) 50 MG tablet TAKE 1 TABLET BY MOUTH EVERY DAY 90 tablet 1   ondansetron (ZOFRAN-ODT) 4 MG disintegrating tablet TAKE 1 TABLET BY MOUTH EVERY 8 HOURS AS NEEDED FOR NAUSEA AND VOMITING 30 tablet 1   potassium chloride (KLOR-CON M) 10 MEQ tablet Take 1 tablet (10 mEq total) by mouth daily. 120 tablet 1   No current facility-administered medications on file prior to visit.    Review of Systems  Constitutional:  Negative for chills and fever.  HENT:  Positive for postnasal drip and sneezing. Negative for congestion.   Eyes:  Negative for visual disturbance.  Respiratory:  Negative for cough and shortness of breath.   Cardiovascular:  Negative for chest pain and palpitations.  Gastrointestinal:  Negative for nausea and vomiting.  Neurological:  Positive for dizziness. Negative for syncope and headaches.      Objective:    BP 128/84   Pulse 83   Temp 98 F (36.7 C) (Oral)   Ht 5\' 4"  (1.626 m)   Wt 250 lb 3.2 oz (113.5 kg)   SpO2 97%   BMI 42.95 kg/m   BP Readings from Last 3 Encounters:  03/26/23 128/84   02/12/23 109/74  01/09/23 130/75   Wt Readings from Last 3 Encounters:  03/26/23 250 lb 3.2 oz (113.5 kg)  01/09/23 263 lb (119.3 kg)  12/12/22 260 lb (117.9 kg)    Physical Exam Vitals reviewed.  Constitutional:      Appearance: She is well-developed.  HENT:     Mouth/Throat:     Pharynx: Uvula midline.  Eyes:     Conjunctiva/sclera: Conjunctivae normal.     Pupils: Pupils are equal, round, and reactive to light.     Comments: Fundus normal bilaterally.   Cardiovascular:     Rate and Rhythm: Normal rate and regular rhythm.     Pulses: Normal pulses.     Heart sounds: Normal heart sounds.  Pulmonary:     Effort: Pulmonary effort is normal.     Breath sounds: Normal breath sounds. No wheezing, rhonchi or rales.  Skin:    General: Skin is warm and  dry.  Neurological:     Mental Status: She is alert.     Cranial Nerves: No cranial nerve deficit.     Sensory: No sensory deficit.     Deep Tendon Reflexes:     Reflex Scores:      Bicep reflexes are 2+ on the right side and 2+ on the left side.      Patellar reflexes are 2+ on the right side and 2+ on the left side.    Comments: Grip equal and strong bilateral upper extremities. Gait strong and steady. Able to perform rapid alternating movement without difficulty.  Dix hall pike maneuver did not elicit vertigo. No nystagmus noted.    Psychiatric:        Speech: Speech normal.        Behavior: Behavior normal.        Thought Content: Thought content normal.

## 2023-03-26 NOTE — Patient Instructions (Addendum)
I question if in the setting of weight loss, you are feeling dizzy due to blood pressure medications.  I would like to do a trial decrease of hctz to a half tablet, 12.5mg  daily to see if dizziness improves.   Ensure you are drinking plenty of water.   Please let me know how you are doing.  If you experience chest pain, palpitations, changes in your vision, sudden headache, which would warrant calling 911.

## 2023-03-26 NOTE — Assessment & Plan Note (Signed)
Congratulated patient on weight loss thus far.  Weight loss has plateaued.  Increase Mounjaro to 10 mg.  Counseled on blackbox warning as relates to multiple endocrine neoplasia, thyroid cancer.  Reiterated again side effects including constipation and to stay vigilant for this with dose increase,

## 2023-03-26 NOTE — Assessment & Plan Note (Addendum)
Reassuring neurologic exam.  Unable to elicit vertigo.  No dizziness during exam today.  Symptom is episodic.  Improved with hydration.  Discussed weight loss and in particular blood pressure medications including losartan 50 mg daily, hydrochlorothiazide 25 mg daily.  Discussed increased hydration and trial decrease hydrochlorothiazide 25 to 12.5 mg daily or every other day to see if symptom improves.  Close follow-up in 1 week.  Plan ultrasound carotid, MRI brain due to known prior h/o mild chronic microvascular ischemic changes seen prior MRI brain if dizziness persists.

## 2023-03-26 NOTE — Assessment & Plan Note (Signed)
MR brain IAC Dr Andee Poles , right tinnitus, dizziness Dedicated posterior fossa study demonstrates no evidence for retrocochlear lesion.   Normal cerebral volume with mild chronic microvascular ischemic change.   Empty sella, with expansion of the bony confines; see discussion above. This appearance appears stable as far back as 2008.   Incidental 1.5 x 2 cm developmental venous anomaly in the RIGHT thalamus and surrounding white matter, stable from 2015.  Consult with Dr Sherryll Burger  06/07/2016 for right ear hissing, negative MRV head.   Chronic , stable. Reviewed prior evaluation. Will follow as long as stable.

## 2023-04-02 ENCOUNTER — Telehealth: Payer: Self-pay | Admitting: Family

## 2023-04-02 ENCOUNTER — Other Ambulatory Visit: Payer: Self-pay | Admitting: Family

## 2023-04-02 NOTE — Telephone Encounter (Signed)
Pt called in to let Arnett knows that her pharmacy does not have Mounjaro 15mg , and they said it will take over a month to get them back in stock. So she wants to know if she can double the dose of 7.5mg  for now?? Until the 15 mg comes back?? She will like a call back.

## 2023-04-04 ENCOUNTER — Other Ambulatory Visit (INDEPENDENT_AMBULATORY_CARE_PROVIDER_SITE_OTHER): Payer: 59

## 2023-04-04 ENCOUNTER — Telehealth: Payer: Self-pay

## 2023-04-04 DIAGNOSIS — E876 Hypokalemia: Secondary | ICD-10-CM

## 2023-04-04 NOTE — Addendum Note (Signed)
Addended by: Oneta Sigman S on: 04/04/2023 12:23 PM   Modules accepted: Orders  

## 2023-04-04 NOTE — Telephone Encounter (Signed)
Lab ordered for lab appt this afternoon.

## 2023-04-04 NOTE — Telephone Encounter (Signed)
-----   Message from Allegra Grana, FNP sent at 04/02/2023 12:21 PM EDT ----- Call patient Patient has not viewed MyChart result note.  Please review my chart note in detail with patient.   Please let me know if questions

## 2023-04-04 NOTE — Telephone Encounter (Signed)
Spoke to pt and she stated that you sent rx for 15 mg to pharmacy but they are out of stock just fyi did you mean to send in the  10 mg instead of 15 mg

## 2023-04-04 NOTE — Addendum Note (Signed)
Addended by: Warden Fillers on: 04/04/2023 12:23 PM   Modules accepted: Orders

## 2023-04-04 NOTE — Addendum Note (Signed)
Addended by: Warden Fillers on: 04/04/2023 12:22 PM   Modules accepted: Orders

## 2023-04-04 NOTE — Telephone Encounter (Signed)
Spoke with pt and she is aware to continue the 7.5 mg until the 10 mg dose is available.

## 2023-04-05 LAB — BASIC METABOLIC PANEL
BUN/Creatinine Ratio: 12 (ref 12–28)
BUN: 13 mg/dL (ref 8–27)
CO2: 24 mmol/L (ref 20–29)
Calcium: 9.9 mg/dL (ref 8.7–10.3)
Chloride: 105 mmol/L (ref 96–106)
Creatinine, Ser: 1.05 mg/dL — ABNORMAL HIGH (ref 0.57–1.00)
Glucose: 90 mg/dL (ref 70–99)
Potassium: 3.3 mmol/L — ABNORMAL LOW (ref 3.5–5.2)
Sodium: 143 mmol/L (ref 134–144)
eGFR: 57 mL/min/{1.73_m2} — ABNORMAL LOW (ref >59)

## 2023-04-07 ENCOUNTER — Other Ambulatory Visit: Payer: Self-pay

## 2023-04-07 DIAGNOSIS — E876 Hypokalemia: Secondary | ICD-10-CM

## 2023-04-07 DIAGNOSIS — I10 Essential (primary) hypertension: Secondary | ICD-10-CM

## 2023-04-07 MED ORDER — POTASSIUM CHLORIDE CRYS ER 10 MEQ PO TBCR
10.0000 meq | EXTENDED_RELEASE_TABLET | Freq: Two times a day (BID) | ORAL | 1 refills | Status: DC
Start: 2023-04-07 — End: 2023-06-02

## 2023-04-08 ENCOUNTER — Emergency Department: Payer: 59

## 2023-04-08 ENCOUNTER — Encounter: Payer: Self-pay | Admitting: Emergency Medicine

## 2023-04-08 DIAGNOSIS — R42 Dizziness and giddiness: Secondary | ICD-10-CM | POA: Diagnosis not present

## 2023-04-08 DIAGNOSIS — R55 Syncope and collapse: Secondary | ICD-10-CM | POA: Diagnosis not present

## 2023-04-08 DIAGNOSIS — R002 Palpitations: Secondary | ICD-10-CM | POA: Diagnosis not present

## 2023-04-08 DIAGNOSIS — E876 Hypokalemia: Secondary | ICD-10-CM | POA: Insufficient documentation

## 2023-04-08 LAB — CBC
HCT: 38 % (ref 36.0–46.0)
Hemoglobin: 12.1 g/dL (ref 12.0–15.0)
MCH: 26.5 pg (ref 26.0–34.0)
MCHC: 31.8 g/dL (ref 30.0–36.0)
MCV: 83.3 fL (ref 80.0–100.0)
Platelets: 233 10*3/uL (ref 150–400)
RBC: 4.56 MIL/uL (ref 3.87–5.11)
RDW: 14.5 % (ref 11.5–15.5)
WBC: 6.1 10*3/uL (ref 4.0–10.5)
nRBC: 0 % (ref 0.0–0.2)

## 2023-04-08 NOTE — ED Triage Notes (Signed)
Patient ambulatory to triage with steady gait, without difficulty or distress noted; pt reports intermittent dizziness x month accomp by frontal HA; st hx thalmus bleed several yrs ago

## 2023-04-09 ENCOUNTER — Emergency Department
Admission: EM | Admit: 2023-04-09 | Discharge: 2023-04-09 | Disposition: A | Discharge status: Home / Self Care | Payer: 59 | Attending: Emergency Medicine | Admitting: Emergency Medicine

## 2023-04-09 DIAGNOSIS — R55 Syncope and collapse: Secondary | ICD-10-CM

## 2023-04-09 DIAGNOSIS — E876 Hypokalemia: Secondary | ICD-10-CM

## 2023-04-09 DIAGNOSIS — R002 Palpitations: Secondary | ICD-10-CM

## 2023-04-09 LAB — BASIC METABOLIC PANEL
Anion gap: 10 (ref 5–15)
Anion gap: 8 (ref 5–15)
BUN: 17 mg/dL (ref 8–23)
BUN: 19 mg/dL (ref 8–23)
CO2: 24 mmol/L (ref 22–32)
CO2: 25 mmol/L (ref 22–32)
Calcium: 9.2 mg/dL (ref 8.9–10.3)
Calcium: 9.6 mg/dL (ref 8.9–10.3)
Chloride: 103 mmol/L (ref 98–111)
Chloride: 108 mmol/L (ref 98–111)
Creatinine, Ser: 1.04 mg/dL — ABNORMAL HIGH (ref 0.44–1.00)
Creatinine, Ser: 1.07 mg/dL — ABNORMAL HIGH (ref 0.44–1.00)
GFR, Estimated: 56 mL/min — ABNORMAL LOW (ref >60)
GFR, Estimated: 57 mL/min — ABNORMAL LOW (ref >60)
Glucose, Bld: 85 mg/dL (ref 70–99)
Glucose, Bld: 94 mg/dL (ref 70–99)
Potassium: 2.6 mmol/L — CL (ref 3.5–5.1)
Potassium: 3.5 mmol/L (ref 3.5–5.1)
Sodium: 138 mmol/L (ref 135–145)
Sodium: 140 mmol/L (ref 135–145)

## 2023-04-09 LAB — TROPONIN I (HIGH SENSITIVITY)
Troponin I (High Sensitivity): 4 ng/L (ref <18)
Troponin I (High Sensitivity): 4 ng/L (ref <18)

## 2023-04-09 LAB — MAGNESIUM: Magnesium: 2.1 mg/dL (ref 1.7–2.4)

## 2023-04-09 MED ORDER — POTASSIUM CHLORIDE 10 MEQ/100ML IV SOLN
10.0000 meq | INTRAVENOUS | Status: AC
  Administered 2023-04-09 (×2): 10 meq via INTRAVENOUS
  Filled 2023-04-09 (×2): qty 100

## 2023-04-09 MED ORDER — POTASSIUM CHLORIDE CRYS ER 20 MEQ PO TBCR
40.0000 meq | EXTENDED_RELEASE_TABLET | Freq: Once | ORAL | Status: AC
  Administered 2023-04-09: 40 meq via ORAL
  Filled 2023-04-09: qty 2

## 2023-04-09 MED ORDER — ONDANSETRON 4 MG PO TBDP
4.0000 mg | ORAL_TABLET | Freq: Once | ORAL | Status: AC
  Administered 2023-04-09: 4 mg via ORAL
  Filled 2023-04-09: qty 1

## 2023-04-09 MED ORDER — SODIUM CHLORIDE 0.9 % IV BOLUS
500.0000 mL | Freq: Once | INTRAVENOUS | Status: AC
  Administered 2023-04-09: 500 mL via INTRAVENOUS

## 2023-04-09 NOTE — ED Provider Notes (Signed)
Hypokalemia now normalized with treatment.  Patient has been ambulatory to the bathroom feels well.  Still feels tired.  Given that her potassium has normalized now, she will continue her supplementation and follow-up with her PMD.   Pilar Jarvis, MD 04/09/23 1224

## 2023-04-09 NOTE — ED Provider Notes (Signed)
[facility] Provider Note    Event Date/Time   First MD Initiated Contact with Patient 04/09/23 [phone]     (approximate)   History   Dizziness   HPI  Jacqueline Delgado is a 72 y.o. female who has been experiencing episodes of feeling lightheaded for about 1 month.  She reports that will come and go, she is not sure what causes them.  She has been seeing her primary care doctor and they have doubled her potassium and to have the dose of hydrochlorothiazide that she is typically taking.  She saw her doctor just recently, there was also some concern for possible vertigo.  She reports she has been doing well but states intermittently will suddenly feel lightheaded.  Today reports she was sitting on her porch when suddenly she started to feel lightheaded.  She will feel suddenly weak as though she might pass out, today she felt a little bit of a fluttering sensation in her chest, as well which lasted for less than a minute.  She did not pass out.  She had no chest pain no shortness of breath.  She otherwise feels well.  At the moment she has no ongoing symptoms or concerns.  No pain or discomfort    Patient has not noticed association with the dizziness with head movements or turning head side-to-side or rapid changes in position  Physical Exam   Triage Vital Signs: ED Triage Vitals  Enc Vitals Group     BP 04/08/23 2339 137/81     Pulse Rate 04/08/23 2339 81     Resp 04/08/23 2339 18     Temp 04/08/23 2339 98.1 F (36.7 C)     Temp Source 04/08/23 2339 Oral     SpO2 04/08/23 2339 97 %     Weight --      Height --      Head Circumference --      Peak Flow --      Pain Score 04/08/23 2330 7     Pain Loc --      Pain Edu? --      Excl. in GC? --     Most recent vital signs: Vitals:   04/09/23 0530 04/09/23 0552  BP: 137/72   Pulse: 62   Resp: 18   Temp:  98.2 F (36.8 C)  SpO2: 96%      General: Awake, no distress.  Normocephalic atraumatic.  Very  pleasant and conversant.  Moves extremities well.  Normal facial expressions. CV:  Good peripheral perfusion.  Normal tones and rate.  No murmurs. Resp:  Normal effort.  Clear bilateral Abd:  No distention.  Soft nontender throughout Other:  No lower extremity edema   ED Results / Procedures / Treatments   Labs (all labs ordered are listed, but only abnormal results are displayed) Labs Reviewed  BASIC METABOLIC PANEL - Abnormal; Notable for the following components:      Result Value   Potassium 2.6 (*)    Creatinine, Ser 1.07 (*)    GFR, Estimated 56 (*)    All other components within normal limits  CBC  MAGNESIUM  BASIC METABOLIC PANEL  TROPONIN I (HIGH SENSITIVITY)  TROPONIN I (HIGH SENSITIVITY)     EKG    Normal EKG is reviewed inter by me at approximately 3 AM.  Normal rate and rhythm.  Normal QRS normal PR intervals.  No evidence of acute ischemia or prolongation of QT interval.  Normal EKG  RADIOLOGY  CT head obtained to evaluate for acute gross abnormality that may be leading to potential vertigo-like symptoms  CT head interpreted by me as negative for acute gross intracranial hemorrhage    PROCEDURES:  Critical Care performed: No  Procedures   MEDICATIONS ORDERED IN ED: Medications  potassium chloride 10 mEq in 100 mL IVPB (10 mEq Intravenous New Bag/Given 04/09/23 0803)  potassium chloride SA (KLOR-CON M) CR tablet 40 mEq (40 mEq Oral Given 04/09/23 0040)  ondansetron (ZOFRAN-ODT) disintegrating tablet 4 mg (4 mg Oral Given 04/09/23 0043)  sodium chloride 0.9 % bolus 500 mL (500 mLs Intravenous New Bag/Given 04/09/23 0604)     IMPRESSION / MDM / ASSESSMENT AND PLAN / ED COURSE  I reviewed the triage vital signs and the nursing notes.                              Differential diagnosis includes, but is not limited to, mild dehydration, electrode abnormality, arrhythmia, hypokalemia in the setting of recent primary care evaluation and medication  changes, central cause though seemingly unlikely based on reassuring nonfocal neurologic exam, etc.  Differential diagnosis quite broad.  Based on labs potassium is quite low magnesium appropriate.  Plan to replete.  She is awake alert hemodynamically stable with reassuring ECG.   Do have some concern given the degree of hypokalemia that she may require observation overnight especially in the setting of associated palpitations with presyncopal feeling.  I consulted with our hospitalist, Dr. Para March, who recommends repeat potassium after repletion, if potassium level.   given Dr. Lianne Bushy recommendations, will plan to repeat potassium after completion of her infusion, if potassium level remains low would anticipate need for admission given the patient has already made outpatient adjustments to hydrochlorothiazide and increased her home potassium dosing.  If normalization, I did discuss with the patient, she reports good ability to follow-up with primary care for recheck of her potassium and follow-up with her PCP.  Patient's presentation is most consistent with acute complicated illness / injury requiring diagnostic workup.  I suspect based on patient's clinical history that the hypokalemia is likely tied to her use of hydrochlorothiazide.  She has reduced the dose of this and increased her home potassium dose  The patient is on the cardiac monitor to evaluate for evidence of arrhythmia and/or significant heart rate changes.  No ectopy noted on telemetry  Ongoing care assigned to Dr. Modesto Charon, with plan to recheck potassium at roughly 9 AM after completing her potassium infusions.  If potassium level has normalized and patient remains well anticipate discharge to follow-up closely with PCP.  If ongoing hypokalemia would anticipate admission given the patient's associated palpitations, presyncopal feeling if ongoing hypokalemia.      FINAL CLINICAL IMPRESSION(S) / ED DIAGNOSES   Final diagnoses:   Hypokalemia  Palpitations  Near syncope     Rx / DC Orders   ED Discharge Orders     None        Note:  This document was prepared using Dragon voice recognition software and may include unintentional dictation errors.   Sharyn Creamer, MD 04/09/23 [phone]

## 2023-04-11 ENCOUNTER — Telehealth: Payer: Self-pay

## 2023-04-11 NOTE — Telephone Encounter (Signed)
Patient called to cancel her lab visit on 04/14/2023 because she went to the ED recently and they ran the same test Rennie Plowman, FNP, had ordered.

## 2023-04-11 NOTE — Telephone Encounter (Signed)
Sch ed follow up with me in 7-10 days

## 2023-04-11 NOTE — Telephone Encounter (Signed)
Called and spoke with patient, she states she does not have transportation right now, but should within the next several days. She stated she will call back to scheduled ED f/u when she has her car back.

## 2023-04-14 ENCOUNTER — Other Ambulatory Visit: Payer: 59

## 2023-04-15 DIAGNOSIS — T8484XA Pain due to internal orthopedic prosthetic devices, implants and grafts, initial encounter: Secondary | ICD-10-CM | POA: Diagnosis not present

## 2023-04-15 DIAGNOSIS — I776 Arteritis, unspecified: Secondary | ICD-10-CM | POA: Diagnosis not present

## 2023-04-15 DIAGNOSIS — M7652 Patellar tendinitis, left knee: Secondary | ICD-10-CM | POA: Diagnosis not present

## 2023-04-15 DIAGNOSIS — Z96652 Presence of left artificial knee joint: Secondary | ICD-10-CM | POA: Diagnosis not present

## 2023-04-16 ENCOUNTER — Encounter: Payer: Self-pay | Admitting: *Deleted

## 2023-04-16 ENCOUNTER — Telehealth: Payer: Self-pay

## 2023-04-16 NOTE — Telephone Encounter (Signed)
Transition Care Management Unsuccessful Follow-up Telephone Call  Date of discharge and from where:  04/09/2023 [facility]  Attempts:  1st Attempt  Reason for unsuccessful TCM follow-up call:  Left voice message  Tayllor Breitenstein [facility]  [facility] Population Health Community Resource Care Guide   [email or web address]  ?? [phone]   Website: [email or web address]  [email or web address]

## 2023-04-16 NOTE — Telephone Encounter (Signed)
Transition Care Management Follow-up Telephone Call Date of discharge and from where: 04/09/2023 [facility] How have you been since you were released from the hospital? Patient is feeling much better Any questions or concerns? No  Items Reviewed: Did the pt receive and understand the discharge instructions provided? Yes  Medications obtained and verified? Yes  Other?  Patient was very pleased with the care she received from staff especially the exceptional bedside manner given by Pilar Jarvis, MD and Sharyn Creamer, MD. Any new allergies since your discharge? No  Dietary orders reviewed? Yes Do you have support at home? Yes   Follow up appointments reviewed:  PCP Hospital f/u appt confirmed?  Patient was told to f/u 7-10 days she wil call to schedule appointment.  Scheduled to see  on  @ . Specialist Hospital f/u appt confirmed? No  Scheduled to see  on  @ . Are transportation arrangements needed?  Patient is aware of transportation benefit with [employer]. If their condition worsens, is the pt aware to call PCP or go to the Emergency Dept.? Yes Was the patient provided with contact information for the PCP's office or ED? Yes Was to pt encouraged to call back with questions or concerns? Yes  Vasti Yagi [facility]  [facility] Population Health Community Resource Care Guide   [email or web address]  ?? [phone]   Website: [email or web address]  [email or web address]

## 2023-04-24 ENCOUNTER — Encounter: Payer: Self-pay | Admitting: *Deleted

## 2023-04-25 ENCOUNTER — Ambulatory Visit: Payer: 59 | Admitting: Anesthesiology

## 2023-04-25 ENCOUNTER — Encounter: Payer: Self-pay | Admitting: Gastroenterology

## 2023-04-25 ENCOUNTER — Encounter
Admission: RE | Disposition: A | Discharge status: Home / Self Care | Payer: Self-pay | Source: Ambulatory Visit | Attending: Gastroenterology

## 2023-04-25 ENCOUNTER — Ambulatory Visit
Admission: RE | Admit: 2023-04-25 | Discharge: 2023-04-25 | Disposition: A | Discharge status: Home / Self Care | Payer: 59 | Source: Ambulatory Visit | Attending: Gastroenterology | Admitting: Gastroenterology

## 2023-04-25 DIAGNOSIS — G4730 Sleep apnea, unspecified: Secondary | ICD-10-CM | POA: Insufficient documentation

## 2023-04-25 DIAGNOSIS — K5730 Diverticulosis of large intestine without perforation or abscess without bleeding: Secondary | ICD-10-CM | POA: Insufficient documentation

## 2023-04-25 DIAGNOSIS — Z6841 Body Mass Index (BMI) 40.0 and over, adult: Secondary | ICD-10-CM | POA: Diagnosis not present

## 2023-04-25 DIAGNOSIS — M1990 Unspecified osteoarthritis, unspecified site: Secondary | ICD-10-CM | POA: Insufficient documentation

## 2023-04-25 DIAGNOSIS — Z9049 Acquired absence of other specified parts of digestive tract: Secondary | ICD-10-CM | POA: Diagnosis not present

## 2023-04-25 DIAGNOSIS — D122 Benign neoplasm of ascending colon: Secondary | ICD-10-CM | POA: Diagnosis not present

## 2023-04-25 DIAGNOSIS — K219 Gastro-esophageal reflux disease without esophagitis: Secondary | ICD-10-CM | POA: Diagnosis not present

## 2023-04-25 DIAGNOSIS — I10 Essential (primary) hypertension: Secondary | ICD-10-CM | POA: Diagnosis not present

## 2023-04-25 DIAGNOSIS — K640 First degree hemorrhoids: Secondary | ICD-10-CM | POA: Diagnosis not present

## 2023-04-25 DIAGNOSIS — K635 Polyp of colon: Secondary | ICD-10-CM | POA: Diagnosis not present

## 2023-04-25 DIAGNOSIS — K649 Unspecified hemorrhoids: Secondary | ICD-10-CM | POA: Diagnosis not present

## 2023-04-25 DIAGNOSIS — Z79899 Other long term (current) drug therapy: Secondary | ICD-10-CM | POA: Diagnosis not present

## 2023-04-25 DIAGNOSIS — K449 Diaphragmatic hernia without obstruction or gangrene: Secondary | ICD-10-CM | POA: Diagnosis not present

## 2023-04-25 DIAGNOSIS — Z1211 Encounter for screening for malignant neoplasm of colon: Secondary | ICD-10-CM | POA: Diagnosis not present

## 2023-04-25 DIAGNOSIS — E669 Obesity, unspecified: Secondary | ICD-10-CM | POA: Insufficient documentation

## 2023-04-25 DIAGNOSIS — Z90710 Acquired absence of both cervix and uterus: Secondary | ICD-10-CM | POA: Diagnosis not present

## 2023-04-25 DIAGNOSIS — Z86010 Personal history of colonic polyps: Secondary | ICD-10-CM | POA: Diagnosis present

## 2023-04-25 DIAGNOSIS — Z800 Family history of malignant neoplasm of digestive organs: Secondary | ICD-10-CM | POA: Insufficient documentation

## 2023-04-25 HISTORY — PX: POLYPECTOMY: SHX5525

## 2023-04-25 HISTORY — PX: COLONOSCOPY WITH PROPOFOL: SHX5780

## 2023-04-25 HISTORY — DX: Depression, unspecified: F32.A

## 2023-04-25 HISTORY — DX: Chronic pain syndrome: G89.4

## 2023-04-25 SURGERY — COLONOSCOPY WITH PROPOFOL
Anesthesia: General

## 2023-04-25 MED ORDER — PHENYLEPHRINE HCL (PRESSORS) 10 MG/ML IV SOLN
INTRAVENOUS | Status: DC | PRN
  Administered 2023-04-25: 200 ug via SUBCUTANEOUS

## 2023-04-25 MED ORDER — PROPOFOL 10 MG/ML IV BOLUS
INTRAVENOUS | Status: DC | PRN
  Administered 2023-04-25: 150 ug/kg/min via INTRAVENOUS
  Administered 2023-04-25: 50 mg via INTRAVENOUS

## 2023-04-25 MED ORDER — SODIUM CHLORIDE 0.9 % IV SOLN: INTRAVENOUS | Status: DC

## 2023-04-25 NOTE — H&P (Signed)
Outpatient short stay form Pre-procedure 04/25/2023  Regis Bill, MD  Primary Physician: Allegra Grana, FNP  Reason for visit:  Surveillance  History of present illness:    72 y/o lady with history of hypertension, obesity, and arthritis here for colonoscopy. Had a adenomatous polyp many years ago. Last colonoscopy in 2019 was normal. Sister had colon cancer in her 82's. History of cholecystectomy and hysterectomy.    Current Facility-Administered Medications:    0.9 %  sodium chloride infusion, , Intravenous, Continuous, Candler Ginsberg, Rossie Muskrat, MD, Last Rate: 20 mL/hr at 04/25/23 1255, Continued from Pre-op at 04/25/23 1255  Medications Prior to Admission  Medication Sig Dispense Refill Last Dose   acetaminophen (TYLENOL) 500 MG tablet Take 500 mg by mouth every 6 (six) hours as needed.      ACIPHEX 20 MG tablet TAKE 1 TABLET (20 MG TOTAL) BY MOUTH IN THE MORNING AND AT BEDTIME.(DAW 1-BRAND NAME) 180 tablet 3 Past Week   hydrochlorothiazide (HYDRODIURIL) 25 MG tablet Take 0.5 tablets (12.5 mg total) by mouth daily. 90 tablet 3 04/24/2023   losartan (COZAAR) 50 MG tablet TAKE 1 TABLET BY MOUTH EVERY DAY 90 tablet 1 04/24/2023   ondansetron (ZOFRAN-ODT) 4 MG disintegrating tablet TAKE 1 TABLET BY MOUTH EVERY 8 HOURS AS NEEDED FOR NAUSEA AND VOMITING 30 tablet 1 04/24/2023   potassium chloride (KLOR-CON M) 10 MEQ tablet Take 1 tablet (10 mEq total) by mouth 2 (two) times daily. 180 tablet 1 04/24/2023   cetirizine (ZYRTEC) 10 MG tablet Take 10 mg by mouth daily.      Cholecalciferol (VITAMIN D3) 25 MCG (1000 UT) CAPS Take 1 capsule (1,000 Units total) by mouth daily. 90 capsule 1    famotidine (PEPCID) 40 MG tablet Take 40 mg by mouth at bedtime.      fluticasone (FLONASE) 50 MCG/ACT nasal spray PLACE 2 SPRAYS INTO BOTH NOSTRILS DAILY AS NEEDED FOR ALLERGIES OR RHINITIS. 48 mL 1    tirzepatide (MOUNJARO) 10 MG/0.5ML Pen Inject 10 mg into the skin once a week. 6 mL 3 04/13/2023      No Known Allergies   Past Medical History:  Diagnosis Date   Anemia    H/O   Anginal pain (HCC)     3/8-12/21/10   Arthritis    Osteoarthritis in BLE knee   Chronic pain syndrome    Colon polyp    Depression    GERD (gastroesophageal reflux disease)    Hiatal hernia    History of hiatal hernia    History of methicillin resistant staphylococcus aureus (MRSA)    Hypertension    controlled   Obesity    Pre-diabetes    Reflux    Sleep apnea    NO CPAP-SLEEP STUDY FROM 2021 WAS NEGATIVE PER PT   Small vessel disease (HCC)     Review of systems:  Otherwise negative.    Physical Exam  Gen: Alert, oriented. Appears stated age.  HEENT: PERRLA. Lungs: No respiratory distress CV: RRR Abd: soft, benign, no masses Ext: No edema    Planned procedures: Proceed with colonoscopy. The patient understands the nature of the planned procedure, indications, risks, alternatives and potential complications including but not limited to bleeding, infection, perforation, damage to internal organs and possible oversedation/side effects from anesthesia. The patient agrees and gives consent to proceed.  Please refer to procedure notes for findings, recommendations and patient disposition/instructions.     Regis Bill, MD [facility] Gastroenterology

## 2023-04-25 NOTE — Anesthesia Postprocedure Evaluation (Signed)
Anesthesia Post Note  Patient: Jacqueline Delgado  Procedure(s) Performed: COLONOSCOPY WITH PROPOFOL POLYPECTOMY  Patient location during evaluation: PACU Anesthesia Type: General Level of consciousness: awake and awake and alert Pain management: satisfactory to patient Vital Signs Assessment: post-procedure vital signs reviewed and stable Respiratory status: spontaneous breathing and nonlabored ventilation Cardiovascular status: blood pressure returned to baseline Anesthetic complications: no   There were no known notable events for this encounter.   Last Vitals:  Vitals:   04/25/23 1407 04/25/23 1417  BP: 106/65 114/69  Pulse: 70 72  Resp: 14 14  Temp:    SpO2: 98% 100%    Last Pain:  Vitals:   04/25/23 1222  TempSrc: Temporal  PainSc: 0-No pain                 VAN STAVEREN,Sora Vrooman

## 2023-04-25 NOTE — Op Note (Addendum)
[facility] Gastroenterology Patient Name: Jacqueline Delgado Procedure Date: 04/25/2023 1:38 PM MRN: [id number] Account #: [id number] Date of Birth: 1951-10-11 Admit Type: Outpatient Age: 72 Room: [facility] ENDO ROOM 3 Gender: Female Note Status: Supervisor Override Instrument Name: Prentice Docker [id number] Procedure:             Colonoscopy Indications:           Family history of colon cancer Providers:             Eather Colas MD, MD Referring MD:          Lyn Records. Arnett (Referring MD) Medicines:             Monitored Anesthesia Care Complications:         No immediate complications. Estimated blood loss:                         Minimal. Procedure:             Pre-Anesthesia Assessment:                        - Prior to the procedure, a History and Physical was                         performed, and patient medications and allergies were                         reviewed. The patient is competent. The risks and                         benefits of the procedure and the sedation options and                         risks were discussed with the patient. All questions                         were answered and informed consent was obtained.                         Patient identification and proposed procedure were                         verified by the physician, the nurse, the                         anesthesiologist, the anesthetist and the technician                         in the endoscopy suite. Mental Status Examination:                         alert and oriented. Airway Examination: normal                         oropharyngeal airway and neck mobility. Respiratory                         Examination: clear to auscultation. CV Examination:  normal. Prophylactic Antibiotics: The patient does not                         require prophylactic antibiotics. Prior                         Anticoagulants: The patient has taken no anticoagulant                          or antiplatelet agents. ASA Grade Assessment: III - A                         patient with severe systemic disease. After reviewing                         the risks and benefits, the patient was deemed in                         satisfactory condition to undergo the procedure. The                         anesthesia plan was to use monitored anesthesia care                         (MAC). Immediately prior to administration of                         medications, the patient was re-assessed for adequacy                         to receive sedatives. The heart rate, respiratory                         rate, oxygen saturations, blood pressure, adequacy of                         pulmonary ventilation, and response to care were                         monitored throughout the procedure. The physical                         status of the patient was re-assessed after the                         procedure.                        After obtaining informed consent, the colonoscope was                         passed under direct vision. Throughout the procedure,                         the patient's blood pressure, pulse, and oxygen                         saturations were monitored continuously. The  Colonoscope was introduced through the anus and                         advanced to the the cecum, identified by appendiceal                         orifice and ileocecal valve. The colonoscopy was                         somewhat difficult due to significant looping.                         Successful completion of the procedure was aided by                         applying abdominal pressure. The patient tolerated the                         procedure well. The quality of the bowel preparation                         was adequate to identify polyps. The ileocecal valve,                         appendiceal orifice, and rectum were photographed. Findings:      The  perianal and digital rectal examinations were normal.      A 2 mm polyp was found in the ascending colon. The polyp was sessile.       The polyp was removed with a jumbo cold forceps. Resection and retrieval       were complete. Estimated blood loss was minimal.      Multiple small-mouthed diverticula were found in the sigmoid colon.      Internal hemorrhoids were found during retroflexion. The hemorrhoids       were Grade I (internal hemorrhoids that do not prolapse).      The exam was otherwise without abnormality on direct and retroflexion       views. Impression:            - One 2 mm polyp in the ascending colon, removed with                         a jumbo cold forceps. Resected and retrieved.                        - Diverticulosis in the sigmoid colon.                        - Internal hemorrhoids.                        - The examination was otherwise normal on direct and                         retroflexion views. Recommendation:        - Discharge patient to home.                        - Resume previous diet.                        -  Continue present medications.                        - Await pathology results.                        - Repeat colonoscopy for surveillance based on                         pathology results.                        - Return to referring physician as previously                         scheduled. Procedure Code(s):     --- Professional ---                        [id number], Colonoscopy, flexible; with biopsy, single or                         multiple Diagnosis Code(s):     --- Professional ---                        Z86.010, Personal history of colonic polyps                        D12.2, Benign neoplasm of ascending colon                        K64.0, First degree hemorrhoids                        K57.30, Diverticulosis of large intestine without                         perforation or abscess without bleeding CPT copyright 2022 American Medical  Association. All rights reserved. The codes documented in this report are preliminary and upon coder review may  be revised to meet current compliance requirements. Eather Colas MD, MD 04/25/2023 2:03:45 PM Number of Addenda: 0 Note Initiated On: 04/25/2023 1:38 PM Scope Withdrawal Time: 0 hours 7 minutes 54 seconds  Total Procedure Duration: 0 hours 16 minutes 47 seconds  Estimated Blood Loss:  Estimated blood loss was minimal.      [facility]

## 2023-04-25 NOTE — Anesthesia Preprocedure Evaluation (Signed)
Anesthesia Evaluation  Patient identified by MRN, date of birth, ID band Patient awake    Reviewed: Allergy & Precautions, NPO status , Patient's Chart, lab work & pertinent test results  Airway Mallampati: II  TM Distance: >3 FB Neck ROM: full    Dental  (+) Teeth Intact   Pulmonary neg pulmonary ROS, sleep apnea    Pulmonary exam normal breath sounds clear to auscultation       Cardiovascular Exercise Tolerance: Good hypertension, Pt. on medications + angina  negative cardio ROS Normal cardiovascular exam Rhythm:Regular Rate:Normal     Neuro/Psych    Depression    negative neurological ROS  negative psych ROS   GI/Hepatic negative GI ROS, Neg liver ROS, hiatal hernia,GERD  Medicated,,  Endo/Other  negative endocrine ROS    Renal/GU negative Renal ROS  negative genitourinary   Musculoskeletal  (+) Arthritis ,    Abdominal  (+) + obese  Peds negative pediatric ROS (+)  Hematology negative hematology ROS (+) Blood dyscrasia, anemia   Anesthesia Other Findings Past Medical History: No date: Anemia     Comment:  H/O No date: Anginal pain (HCC)     Comment:   3/8-12/21/10 No date: Arthritis     Comment:  Osteoarthritis in BLE knee No date: Chronic pain syndrome No date: Colon polyp No date: Depression No date: GERD (gastroesophageal reflux disease) No date: Hiatal hernia No date: History of hiatal hernia No date: History of methicillin resistant staphylococcus aureus (MRSA) No date: Hypertension     Comment:  controlled No date: Obesity No date: Pre-diabetes No date: Reflux No date: Sleep apnea     Comment:  NO CPAP-SLEEP STUDY FROM 2021 WAS NEGATIVE PER PT No date: Small vessel disease (HCC)  Past Surgical History: No date: ABDOMINAL HYSTERECTOMY     Comment:  partial; per patient has left ovary No date: BREAST SURGERY No date: CARPAL TUNNEL RELEASE No date: CHOLECYSTECTOMY No date: COLONOSCOPY W/  POLYPECTOMY     Comment:  adenomatous colon polyp 12/10/2017: COLONOSCOPY WITH PROPOFOL; N/A     Comment:  Procedure: COLONOSCOPY WITH PROPOFOL;  Surgeon: Scot Jun, MD;  Location: [facility] ENDOSCOPY;  Service:               Endoscopy;  Laterality: N/A; 10/11/2015: ESOPHAGOGASTRODUODENOSCOPY (EGD) WITH PROPOFOL; N/A     Comment:  Procedure: ESOPHAGOGASTRODUODENOSCOPY (EGD) WITH               PROPOFOL;  Surgeon: Scot Jun, MD;  Location: [facility]              ENDOSCOPY;  Service: Endoscopy;  Laterality: N/A; 12/10/2017: ESOPHAGOGASTRODUODENOSCOPY (EGD) WITH PROPOFOL; N/A     Comment:  Procedure: ESOPHAGOGASTRODUODENOSCOPY (EGD) WITH               PROPOFOL;  Surgeon: Scot Jun, MD;  Location:               [facility] ENDOSCOPY;  Service: Endoscopy;  Laterality: N/A; 05/03/2022: ESOPHAGOGASTRODUODENOSCOPY (EGD) WITH PROPOFOL; N/A     Comment:  Procedure: ESOPHAGOGASTRODUODENOSCOPY (EGD) WITH               PROPOFOL;  Surgeon: Regis Bill, MD;  Location:               [facility] ENDOSCOPY;  Service: Endoscopy;  Laterality: N/A; No date: JOINT REPLACEMENT; Right     Comment:  knee, Oct 2012  No date: JOINT REPLACEMENT     Comment:  hopefully getting left partial knee replacement in July               2016 No date: JOINT REPLACEMENT; Left     Comment:  03/06/21 10/11/2015: SAVORY DILATION; N/A     Comment:  Procedure: SAVORY DILATION;  Surgeon: Scot Jun,               MD;  Location: [facility] ENDOSCOPY;  Service: Endoscopy;                Laterality: N/A; 06/14/2015: SHOULDER ARTHROSCOPY WITH ROTATOR CUFF REPAIR AND  SUBACROMIAL DECOMPRESSION; Right     Comment:  Procedure: SHOULDER ARTHROSCOPY WITH mini open ROTATOR               CUFF REPAIR AND SUBACROMIAL DECOMPRESSION, release long               head biceps tendon.;  Surgeon: Erin Sons, MD;                Location: [facility] ORS;  Service: Orthopedics;  Laterality:               Right; 03/06/2021: TOTAL  KNEE ARTHROPLASTY; Left     Comment:  Procedure: TOTAL KNEE ARTHROPLASTY - [place] to               Assist;  Surgeon: Kennedy Bucker, MD;  Location: [facility] ORS;              Service: Orthopedics;  Laterality: Left; No date: TRIGGER FINGER RELEASE  BMI    Body Mass Index: 43.08 kg/m      Reproductive/Obstetrics negative OB ROS                             Anesthesia Physical Anesthesia Plan  ASA: 3  Anesthesia Plan: General   Post-op Pain Management:    Induction: Intravenous  PONV Risk Score and Plan: Propofol infusion and TIVA  Airway Management Planned: Natural Airway  Additional Equipment:   Intra-op Plan:   Post-operative Plan:   Informed Consent: I have reviewed the patients History and Physical, chart, labs and discussed the procedure including the risks, benefits and alternatives for the proposed anesthesia with the patient or authorized representative who has indicated his/her understanding and acceptance.     Dental Advisory Given  Plan Discussed with: CRNA and Surgeon  Anesthesia Plan Comments:        Anesthesia Quick Evaluation

## 2023-04-25 NOTE — Interval H&P Note (Signed)
History and Physical Interval Note:  04/25/2023 1:36 PM  Jacqueline Delgado  has presented today for surgery, with the diagnosis of FH Colon Cancer.  The various methods of treatment have been discussed with the patient and family. After consideration of risks, benefits and other options for treatment, the patient has consented to  Procedure(s): COLONOSCOPY WITH PROPOFOL (N/A) as a surgical intervention.  The patient's history has been reviewed, patient examined, no change in status, stable for surgery.  I have reviewed the patient's chart and labs.  Questions were answered to the patient's satisfaction.     Regis Bill  Ok to proceed with colonoscopy

## 2023-04-25 NOTE — Transfer of Care (Signed)
Immediate Anesthesia Transfer of Care Note  Patient: Jacqueline Delgado  Procedure(s) Performed: COLONOSCOPY WITH PROPOFOL POLYPECTOMY  Patient Location: PACU  Anesthesia Type:General  Level of Consciousness: awake and alert   Airway & Oxygen Therapy: Patient Spontanous Breathing  Post-op Assessment: Report given to RN and Post -op Vital signs reviewed and stable  Post vital signs: stable  Last Vitals:  Vitals Value Taken Time  BP 106/65 04/25/23 1407  Temp    Pulse 70 04/25/23 1407  Resp 14 04/25/23 1407  SpO2 98 % 04/25/23 1407    Last Pain:  Vitals:   04/25/23 1222  TempSrc: Temporal  PainSc: 0-No pain         Complications: No notable events documented.

## 2023-04-26 NOTE — Progress Notes (Signed)
Pt complained of small amount of blood from nose when she blew her nose.  Pt informed that was a normal response. Pt instructed to monitor and contact MD if persists. She denies pain or discomfort.

## 2023-05-20 DIAGNOSIS — Z96652 Presence of left artificial knee joint: Secondary | ICD-10-CM | POA: Diagnosis not present

## 2023-05-20 DIAGNOSIS — M25562 Pain in left knee: Secondary | ICD-10-CM | POA: Diagnosis not present

## 2023-05-25 NOTE — Progress Notes (Unsigned)
PROVIDER NOTE: Information contained herein reflects review and annotations entered in association with encounter. Interpretation of such information and data should be left to medically-trained personnel. Information provided to patient can be located elsewhere in the medical record under "Patient Instructions". Document created using STT-dictation technology, any transcriptional errors that may result from process are unintentional.    Patient: Jacqueline Delgado  Service Category: E/M  Provider: Oswaldo Done, MD  DOB: [birth year]  DOS: 05/26/2023  Referring Provider: Allegra Grana, FNP  MRN: [id number]  Specialty: Interventional Pain Management  PCP: Allegra Grana, FNP  Type: Established Patient  Setting: Ambulatory outpatient    Location: Office  Delivery: Face-to-face     HPI  [name], a 72 y.o. year old female, is here today because of her No primary diagnosis found.. [name] primary complain today is No chief complaint on file.  Pertinent problems: [name] has Plantar fasciitis; Localized swelling of finger of left hand; Acquired trigger finger; Closed nondisplaced fracture of base of fifth metacarpal bone of right hand; Complete tear of right rotator cuff; DDD (degenerative disc disease), cervical; DDD (degenerative disc disease), lumbar; Chronic low back pain (1ry area of Pain) (Bilateral) (R>L) w/ sciatica (Right); Neck pain; Non-cardiac chest pain; S/P rotator cuff repair; Osteoarthritis, knee; Unspecified osteoarthritis, unspecified site; Osteoarthritis of knee (Left); Ankle swelling, right; Chronic pain syndrome; DDD (degenerative disc disease), lumbosacral; Lumbar facet hypertrophy; Abnormal MRI, lumbar spine (08/11/20); Lumbosacral central spinal stenosis (9 mm) (L4-5 and L5-S1); Chronic lower extremity pain (Right); Lumbosacral radiculopathy at L5 (Right); Chronic knee pain (3ry area of Pain) (Left); Swelling of foot (4th area of Pain) (Right); Chronic lower  extremity pain (2ry area of Pain) (Bilateral) (R>L); Calcaneal spur of foot (Right); Lumbar Grade 1 Anterolisthesis of L4/L5; Lumbosacral facet syndrome (Bilateral) (R>L); Chronic low back pain (Bilateral) (R>L) w/o sciatica; Spondylosis without myelopathy or radiculopathy, lumbosacral region; S/P TKR (total knee replacement) using cement, left; Chronic knee pain after total replacement of knee joint (Left); Osteoarthritis of lumbar spine; Osteoarthritis involving multiple joints; Acute midline low back pain without sciatica; Chronic shoulder pain (Multifactorial) (Left); Abnormal MRI, cervical spine (07/25/2022); Cervicalgia; Cervical foraminal stenosis (Severe, Left: C3-4, C4-5) (Bilateral: C5-6, C6-7); Radicular pain of shoulder (Left); Cervical radiculopathy (Left) (C4, C5); Osteoarthritis of shoulder (Left); Osteoarthritis of AC (acromioclavicular) joint (Left); Arthralgia of acromioclavicular joint (Left); Upper arm pain (Left); Subacromial bursitis of shoulder joint (Left); Subdeltoid bursitis of shoulder joint (Left); Subscapularis tendinosis (Left); and Supraspinatus tendinosis (Left) on their pertinent problem list. Pain Assessment: Severity of   is reported as a  /10. Location:    / . Onset:  . Quality:  . Timing:  . Modifying factor(s):  Marland Kitchen Vitals:  vitals were not taken for this visit.  BMI: Estimated body mass index is 43.08 kg/m as calculated from the following:   Height as of 04/25/23: 5\' 3"  (1.6 m).   Weight as of 04/25/23: 243 lb 3.2 oz (110.3 kg). Last encounter: 01/23/2023. Last procedure: 01/09/2023.  Reason for encounter: {Blank single:19197::"medication management","post-procedure evaluation and assessment","both, medication management and post-procedure evaluation and assessment","evaluation of worsening, or previously known (established) problem","patient-requested evaluation","follow-up evaluation","evaluation for possible interventional PM therapy/treatment","new problems"," *** "}.  ***  Pharmacotherapy Assessment  Analgesic: No chronic opioid analgesics therapy prescribed by our practice. Tramadol 50 mg tablet, 1 tab p.o. twice daily (PRN) MME/day: 0-10 mg/day   Monitoring: [place] PMP: PDMP reviewed during this encounter. {Blank single:19197::"Unable to conduct review of the controlled substance reporting system  due to technological failure.","     "} Pharmacotherapy: {Blank single:19197::"Opioid-induced constipation (OIC)(K59.03, T40.2X5A)","No side-effects or adverse reactions reported."} Compliance: {Blank single:19197::"Medication agreement violation - unsanctioned acquisition/use of additional opioid-containing medication","No problems identified."} Effectiveness: {Blank single:19197::"Clinically acceptable."}  No notes on file  No results found for: "CBDTHCR" No results found for: "D8THCCBX" No results found for: "D9THCCBX"  UDS:  Summary  Date Value Ref Range Status  05/22/2020 Note  Final    Comment:    ==================================================================== Compliance Drug Analysis, Ur ==================================================================== Test                             Result       Flag       Units  Drug Present and Declared for Prescription Verification   Tramadol                       >3268        EXPECTED   ng/mg creat   O-Desmethyltramadol            2372         EXPECTED   ng/mg creat   N-Desmethyltramadol            1509         EXPECTED   ng/mg creat    Source of tramadol is a prescription medication. O-desmethyltramadol    and N-desmethyltramadol are expected metabolites of tramadol.    Bupropion                      PRESENT      EXPECTED   Hydroxybupropion               PRESENT      EXPECTED    Hydroxybupropion is an expected metabolite of bupropion.  Drug Present not Declared for Prescription Verification   Dextromethorphan               PRESENT      UNEXPECTED   Dextrorphan/Levorphanol        PRESENT       UNEXPECTED    Dextrorphan is an expected metabolite of dextromethorphan, an over-    the-counter or prescription cough suppressant. Dextrorphan cannot be    distinguished from the scheduled prescription medication levorphanol    by the method used for analysis.  ==================================================================== Test                      Result    Flag   Units      Ref Range   Creatinine              153              mg/dL      >=16 ==================================================================== Declared Medications:  The flagging and interpretation on this report are based on the  following declared medications.  Unexpected results may arise from  inaccuracies in the declared medications.   **Note: The testing scope of this panel includes these medications:   Bupropion (Wellbutrin)  Tramadol (Ultram)   **Note: The testing scope of this panel does not include the  following reported medications:   Famotidine (Pepcid)  Hydrochlorothiazide (Hydrodiuril)  Losartan (Cozaar)  Meloxicam (Mobic)  Mirabegron (Myrbetriq)  Rabeprazole (Aciphex) ==================================================================== For clinical consultation, please call [phone]. ====================================================================       ROS  Constitutional: {Blank single:19197::"Denies any fever or chills"} Gastrointestinal: {Blank  single:19197::"No reported hemesis, hematochezia, vomiting, or acute GI distress"} Musculoskeletal: {Blank single:19197::"Denies any acute onset joint swelling, redness, loss of ROM, or weakness"} Neurological: {Blank single:19197::"No reported episodes of acute onset apraxia, aphasia, dysarthria, agnosia, amnesia, paralysis, loss of coordination, or loss of consciousness"}  Medication Review  RABEprazole, Vitamin D3, acetaminophen, cetirizine, famotidine, fluticasone, hydrochlorothiazide, losartan, ondansetron, potassium  chloride, and tirzepatide  History Review  Allergy: [name] has No Known Allergies. Drug: [name]  reports no history of drug use. Alcohol:  reports no history of alcohol use. Tobacco:  reports that she has never smoked. She has never used smokeless tobacco. Social: [name]  reports that she has never smoked. She has never used smokeless tobacco. She reports that she does not drink alcohol and does not use drugs. Medical:  has a past medical history of Anemia, Anginal pain (HCC), Arthritis, Chronic pain syndrome, Colon polyp, Depression, GERD (gastroesophageal reflux disease), Hiatal hernia, History of hiatal hernia, History of methicillin resistant staphylococcus aureus (MRSA), Hypertension, Obesity, Pre-diabetes, Reflux, Sleep apnea, and Small vessel disease (HCC). Surgical: Ms. Sieker  has a past surgical history that includes Joint replacement (Right); Joint replacement; Cholecystectomy; Abdominal hysterectomy; Shoulder arthroscopy with rotator cuff repair and subacromial decompression (Right, 06/14/2015); Esophagogastroduodenoscopy (egd) with propofol (N/A, 10/11/2015); Savory dilation (N/A, 10/11/2015); Colonoscopy w/ polypectomy; Esophagogastroduodenoscopy (egd) with propofol (N/A, 12/10/2017); Colonoscopy with propofol (N/A, 12/10/2017); Total knee arthroplasty (Left, 03/06/2021); Joint replacement (Left); Esophagogastroduodenoscopy (egd) with propofol (N/A, 05/03/2022); Breast surgery; Carpal tunnel release; Trigger finger release; Colonoscopy with propofol (N/A, 04/25/2023); and polypectomy (04/25/2023). Family: family history includes Alcoholism in an other family member; Arthritis in an other family member; Breast cancer in her maternal grandmother; Breast cancer (age of onset: 69) in her mother; Diabetes in an other family member; Heart disease in her father; Hyperlipidemia in her father; Hypertension in her father; Stroke in her father.  Laboratory Chemistry Profile   Renal Lab  Results  Component Value Date   BUN 19 04/09/2023   CREATININE 1.04 (H) 04/09/2023   BCR 12 04/04/2023   GFR 49.08 (L) 03/26/2023   GFRAA 81 09/13/2020   GFRNONAA 57 (L) 04/09/2023    Hepatic Lab Results  Component Value Date   AST 18 03/26/2023   ALT 14 03/26/2023   ALBUMIN 4.2 03/26/2023   ALKPHOS 86 03/26/2023   LIPASE 36 10/12/2017    Electrolytes Lab Results  Component Value Date   NA 140 04/09/2023   K 3.5 04/09/2023   CL 108 04/09/2023   CALCIUM 9.2 04/09/2023   MG 2.1 04/09/2023    Bone Lab Results  Component Value Date   VD25OH 21.08 (L) 01/29/2023   25OHVITD1 18 (L) 05/22/2020   25OHVITD2 13 05/22/2020   25OHVITD3 5.3 05/22/2020    Inflammation (CRP: Acute Phase) (ESR: Chronic Phase) Lab Results  Component Value Date   CRP 15 (H) 05/22/2020   ESRSEDRATE 73 (H) 05/22/2020         Note: {Blank single:19197::"[name] indicates labs done and monitored by primary care practitioner using a non-[facility] EMR system","No results found under the [facility] electronic medical record","Results made available to patient.","Lab results reviewed and made available to patient.","Lab results reviewed and explained to patient in Layman's terms.","Above Lab results reviewed."}  Recent Imaging Review  CT Head Wo Contrast CLINICAL DATA:  Vertigo.  EXAM: CT HEAD WITHOUT CONTRAST  TECHNIQUE: Contiguous axial images were obtained from the base of the skull through the vertex without intravenous contrast.  RADIATION DOSE REDUCTION: This exam was  performed according to the departmental dose-optimization program which includes automated exposure control, adjustment of the mA and/or kV according to patient size and/or use of iterative reconstruction technique.  COMPARISON:  CT head dated April 12, 2019  FINDINGS: Brain: No evidence of acute infarction, hemorrhage, hydrocephalus, extra-axial collection or mass lesion/mass effect.  Vascular: No hyperdense vessel or  unexpected calcification.  Skull: Normal. Negative for fracture or focal lesion.  Sinuses/Orbits: No acute finding.  Other: None.  IMPRESSION: No acute intracranial pathology.  Electronically Signed   By: Larose Hires D.O.   On: 04/09/2023 00:01 Note: {Blank single:19197::"No new results found.","No results found under the [facility] electronic medical record.","Imaging results reviewed and explained to patient in Layman's terms.","Results of ordered imaging test(s) reviewed and explained to patient in Layman's terms.","Imaging results reviewed.","Reviewed"} {Blank single:19197::"Results visible under [facility].","Results visible under [facility].","Results visible under [facility].","Results visible under [facility].","Results visible under "Care Everywhere".","Results made available to patient.","Copy of results provided to patient.","     "}  Physical Exam  General appearance: {general exam:210120802::"Well nourished, well developed, and well hydrated. In no apparent acute distress"} Mental status: {Blank single:19197::"Alert and oriented x 3. Exaggerated physical and/or psychosocial pain behavior perceived.","Alert, oriented x 3 (person, place, & time)"} {Blank single:19197::"[name]'s speech pattern and demeanor seems to suggest oversedation","     "} Respiratory: {Blank single:19197::"Oxygen-dependent COPD","No evidence of acute respiratory distress"} Eyes: {Blank single:19197::"Miotic (pupilary constriction) due to opiate use","Midriatic","Anisocoric","Evidence of ptosis","Pin-point pupils","PERLA"} Vitals: There were no vitals taken for this visit. BMI: Estimated body mass index is 43.08 kg/m as calculated from the following:   Height as of 04/25/23: 5\' 3"  (1.6 m).   Weight as of 04/25/23: 243 lb 3.2 oz (110.3 kg). Ideal: Patient weight not recorded  Assessment   Diagnosis Status  No diagnosis found. {Problem Stability:19197::"Deteriorating","Having a  Flare-up","Improved","Improving","Not improving","Not responding","Persistent","Recurring","Reoccurring","Resolved","Responding","Stable","Unchanged","Unimproved","Worsened","Worsening","Controlled"} {Problem Stability:19197::"Deteriorating","Having a Flare-up","Improved","Improving","Not improving","Not responding","Persistent","Recurring","Reoccurring","Resolved","Responding","Stable","Unchanged","Unimproved","Worsened","Worsening","Controlled"} {Problem Stability:19197::"Deteriorating","Having a Flare-up","Improved","Improving","Not improving","Not responding","Persistent","Recurring","Reoccurring","Resolved","Responding","Stable","Unchanged","Unimproved","Worsened","Worsening","Controlled"}   Updated Problems: No problems updated.  Plan of Care  Problem-specific:  No problem-specific Assessment & Plan notes found for this encounter.  [name] has a current medication list which includes the following long-term medication(s): aciphex, fluticasone, hydrochlorothiazide, losartan, and potassium chloride.  Pharmacotherapy (Medications Ordered): No orders of the defined types were placed in this encounter.  Orders:  No orders of the defined types were placed in this encounter.  Follow-up plan:   No follow-ups on file.      Interventional Therapies  Risk Factors  Considerations:  Cerebrovascular malformation  GERD  HTN MO  OSA  SOB No RFA until BMI is below 35 kg/m.   Planned  Pending:   Diagnostic/therapeutic left IA glenohumeral + AC joint inj. + subacromial bursa inj. #1 (01/09/2023)    Under consideration:   Therapeutic left CESI #2    Completed:   Therapeutic left cervical ESI x1 (11/26/2022) (75/75/50/50) (Neck: 100  (L) shoulder: 50)  Therapeutic/palliative bilateral lumbar facet MBB x3 (08/30/2021) (100/100/100/0) pain worsened after injection. Therapeutic right L4-5 LESI x1 (07/27/2020)  Referral to bariatric surgery and medical weight management entered  on 07/18/2020.    Completed by other providers:   EEG x2 by Dr. Cristopher Peru (07/03/14, 07/29/14) - WNL  Diagnostic left occipital nerve block x1 (08/10/2014) by Dr. Cristopher Peru [facility] neurology)  Therapeutic right rotator cuff repair x1 (06/14/2015) by Neva Seat MD [facility] orthopedics)  Therapeutic left IA Synvisc knee injection x2 (10/02/2020) by Nolon Rod, PA [facility] orthopedics and PMR) Therapeutic left total knee  replacement x1 (03/06/2021) by Dr. Kennedy Bucker [facility] orthopedics)   Therapeutic  Palliative (PRN) options:   Palliative bilateral lumbar facet MBB #4  Therapeutic right L4-5 LESI #2        Recent Visits No visits were found meeting these conditions. Showing recent visits within past 90 days and meeting all other requirements Future Appointments Date Type Provider Dept  05/26/23 Appointment Delano Metz, MD [facility]  Showing future appointments within next 90 days and meeting all other requirements  I discussed the assessment and treatment plan with the patient. The patient was provided an opportunity to ask questions and all were answered. The patient agreed with the plan and demonstrated an understanding of the instructions.  Patient advised to call back or seek an in-person evaluation if the symptoms or condition worsens.  Duration of encounter: *** minutes.  Total time on encounter, as per AMA guidelines included both the face-to-face and non-face-to-face time personally spent by the physician and/or other qualified health care professional(s) on the day of the encounter (includes time in activities that require the physician or other qualified health care professional and does not include time in activities normally performed by clinical staff). Physician's time may include the following activities when performed: Preparing to see the patient (e.g., pre-charting review of records, searching for previously ordered imaging, lab work, and nerve  conduction tests) Review of prior analgesic pharmacotherapies. Reviewing PMP Interpreting ordered tests (e.g., lab work, imaging, nerve conduction tests) Performing post-procedure evaluations, including interpretation of diagnostic procedures Obtaining and/or reviewing separately obtained history Performing a medically appropriate examination and/or evaluation Counseling and educating the patient/family/caregiver Ordering medications, tests, or procedures Referring and communicating with other health care professionals (when not separately reported) Documenting clinical information in the electronic or other health record Independently interpreting results (not separately reported) and communicating results to the patient/ family/caregiver Care coordination (not separately reported)  Note by: Oswaldo Done, MD Date: 05/26/2023; Time: 8:56 AM

## 2023-05-26 ENCOUNTER — Encounter: Payer: Self-pay | Admitting: Pain Medicine

## 2023-05-26 ENCOUNTER — Ambulatory Visit: Payer: 59 | Attending: Pain Medicine | Admitting: Pain Medicine

## 2023-05-26 VITALS — BP 110/77 | HR 84 | Temp 98.2°F | Resp 18 | Ht 63.0 in | Wt 237.0 lb

## 2023-05-26 DIAGNOSIS — M25512 Pain in left shoulder: Secondary | ICD-10-CM

## 2023-05-26 DIAGNOSIS — M545 Low back pain, unspecified: Secondary | ICD-10-CM | POA: Diagnosis not present

## 2023-05-26 DIAGNOSIS — M47817 Spondylosis without myelopathy or radiculopathy, lumbosacral region: Secondary | ICD-10-CM | POA: Diagnosis not present

## 2023-05-26 DIAGNOSIS — M47816 Spondylosis without myelopathy or radiculopathy, lumbar region: Secondary | ICD-10-CM

## 2023-05-26 DIAGNOSIS — M4310 Spondylolisthesis, site unspecified: Secondary | ICD-10-CM

## 2023-05-26 DIAGNOSIS — M5136 Other intervertebral disc degeneration, lumbar region: Secondary | ICD-10-CM

## 2023-05-26 DIAGNOSIS — M75121 Complete rotator cuff tear or rupture of right shoulder, not specified as traumatic: Secondary | ICD-10-CM

## 2023-05-26 DIAGNOSIS — M5459 Other low back pain: Secondary | ICD-10-CM | POA: Insufficient documentation

## 2023-05-26 DIAGNOSIS — M19012 Primary osteoarthritis, left shoulder: Secondary | ICD-10-CM | POA: Diagnosis not present

## 2023-05-26 DIAGNOSIS — G8929 Other chronic pain: Secondary | ICD-10-CM | POA: Diagnosis not present

## 2023-05-26 DIAGNOSIS — F411 Generalized anxiety disorder: Secondary | ICD-10-CM | POA: Insufficient documentation

## 2023-05-26 NOTE — Patient Instructions (Signed)

## 2023-05-27 DIAGNOSIS — Z96652 Presence of left artificial knee joint: Secondary | ICD-10-CM | POA: Diagnosis not present

## 2023-05-29 DIAGNOSIS — Z96652 Presence of left artificial knee joint: Secondary | ICD-10-CM | POA: Diagnosis not present

## 2023-05-29 DIAGNOSIS — M25562 Pain in left knee: Secondary | ICD-10-CM | POA: Diagnosis not present

## 2023-05-30 ENCOUNTER — Telehealth: Payer: Self-pay

## 2023-05-30 ENCOUNTER — Telehealth (INDEPENDENT_AMBULATORY_CARE_PROVIDER_SITE_OTHER): Payer: 59 | Admitting: Family

## 2023-05-30 ENCOUNTER — Encounter: Payer: Self-pay | Admitting: Family

## 2023-05-30 VITALS — Ht 63.0 in | Wt 238.0 lb

## 2023-05-30 DIAGNOSIS — Q283 Other malformations of cerebral vessels: Secondary | ICD-10-CM | POA: Diagnosis not present

## 2023-05-30 DIAGNOSIS — R42 Dizziness and giddiness: Secondary | ICD-10-CM

## 2023-05-30 DIAGNOSIS — I10 Essential (primary) hypertension: Secondary | ICD-10-CM | POA: Diagnosis not present

## 2023-05-30 NOTE — Patient Instructions (Addendum)
STOP hydrochlorothiazide completely. YOU ALSO STOP POTASSIUM.   Monitor blood pressure.   Please make a follow up and we will discuss returning to Dr Sherryll Burger or neurology.

## 2023-05-30 NOTE — Progress Notes (Unsigned)
Virtual Visit via Video Note  I connected with Jacqueline Delgado on 06/02/23 at  4:00 PM EDT by a video enabled telemedicine application and verified that I am speaking with the correct person using two identifiers. Location patient: home Location provider: work  Persons participating in the virtual visit: patient, provider  I discussed the limitations of evaluation and management by telemedicine and the availability of in person appointments. The patient expressed understanding and agreed to proceed.  HPI: She complains of persistent episodic dizziness  She has felt 'fine' after ED visit 04/09/23 and then the past couple of weeks , dizziness comes and goes.  Aggrevated by bending over, laughing 'real hard',  getting in a hot shower.   Symptom doesn't 'last long' and if she rests, resolves.    Room is not spinning.   Denies syncope, CP, SOB, palpitations.   She is compliant with potassium chloride 10 meq BID and she been taking hydrochlorothiazide alternating 12.5mg  and 25mg . She didn't decrease hydrochlorothiazide to 12.5mg  every day after last visit.   She continues to loose weight .  She is doing PT for bilateral knee pain, L > R.    Seen in ED 04/09/23 for lightheadedness x one month CT head obtained and negative for acute intracranial hemorrhage, stroke She was given IV fluids, potassium chloride Zofran; magnesium repleted. Impression indicated mild dehydration, electrolyte abnormality, arrhythmia, hypokalemia  Labs 04/08/23 K 2.6--> 3.5 Crt 1.07 Mag 2.1  Trop x 2 , negative  Chronic right-sided tinnitus  Decreased hydrochlorothiazide from 25 to 12.5 mg 03/26/2023  Last seen by Dr Sandie Ano 01/19/21 for edema, HTN   MRA Head 06/09/2014-No large vessel occlusion. There is probable mild to moderate  stenosis in the right A1 segment. Otherwise negative.   MRI head 06/09/2014 15 x 20 mm lesion in the right thalamus with evidence of hemorrhage  and mild enhancement. No other  lesions.   MR brain Stable exam. No progression of hemorrhage, enhancement, or overall  size in the RIGHT thalamic/deep white matter abnormality. Favor  occult cerebral vascular malformation/developmental venous anomaly.  Recommend short-term six-month follow-up.   Consult Dr Sherryll Burger 07/06/2014 1. Abnormal MRI brain with right thalamic abnormal signals - ? Hemorrhagic stroke vs congenital malformation - radiologist recommended follow up MRI brain with and without contrast (will order) - I feel like this radiological abnormality might not be cause of any of her current symptoms. Patient does not have any signs or symptoms suggestive of ischemic lesion on her brain I did not see any significant white matter disease a suggestive of any demyelinating disease. Patient does not have any cortical small ischemia. I have very low suspicion for a vasculitic lesion in the brain.  2. Recurrent blacking out spells without LOC - neg EEG done by [place] ordered by hospitalist physician -  Arrange in office attended prolonged video EEG (4 hr)    Family h/o cerebral aneurysm   ROS: See pertinent positives and negatives per HPI.  EXAM:  VITALS per patient if applicable: Ht 5\' 3"  (1.6 m)   Wt 238 lb (108 kg)   BMI 42.16 kg/m  BP Readings from Last 3 Encounters:  05/26/23 110/77  04/25/23 (!) 111/59  04/09/23 129/72   Wt Readings from Last 3 Encounters:  05/30/23 238 lb (108 kg)  05/26/23 237 lb (107.5 kg)  04/25/23 243 lb 3.2 oz (110.3 kg)    GENERAL: alert, oriented, appears well and in no acute distress  HEENT: atraumatic, conjunttiva clear, no obvious abnormalities  on inspection of external nose and ears  NECK: normal movements of the head and neck  LUNGS: on inspection no signs of respiratory distress, breathing rate appears normal, no obvious gross SOB, gasping or wheezing  CV: no obvious cyanosis  MS: moves all visible extremities without noticeable abnormality  PSYCH/NEURO: pleasant  and cooperative, no obvious depression or anxiety, speech and thought processing grossly intact  ASSESSMENT AND PLAN: Dizziness -     US Carotid Bilateral; Future  Essential hypertension Assessment & Plan: Question if dizziness r/t hypotension in the setting of intentional weight loss.  In the setting of hypokalemia, advised to stop hydrochlorothiazide completely Advised to stop potassium chloride. Added hydrochlorothiazide to allergy list and would be cautious about reuse. Continue losartan 50mg  every day.   Orders: -     Basic metabolic panel -     Ambulatory referral to Cardiology  Cerebral vascular malformation Assessment & Plan: Reviewed prior extensive work up for dizziness 2015 with Dr Sherryll Burger. MRA head and MRI brain obtained 05/2014. Advised follow up imaging in setting of symptoms.  Declines follow up dr Sherryll Burger, neuroimaging and prefers to trial stop hydrochlorothiazide first. Will discuss at follow up and strongly advise if symptoms are not resolved.       -we discussed possible serious and likely etiologies, options for evaluation and workup, limitations of telemedicine visit vs in person visit, treatment, treatment risks and precautions. Pt prefers to treat via telemedicine empirically rather then risking or undertaking an in person visit at this moment.    I discussed the assessment and treatment plan with the patient. The patient was provided an opportunity to ask questions and all were answered. The patient agreed with the plan and demonstrated an understanding of the instructions.   The patient was advised to call back or seek an in-person evaluation if the symptoms worsen or if the condition fails to improve as anticipated.  Advised if desired AVS can be mailed or viewed via MyChart if Mychart user.   Rennie Plowman, FNP

## 2023-05-30 NOTE — Telephone Encounter (Signed)
Called pt to inform her that we may be able to get her appt moved up to 330 if all goes well and she verbalized that she was okay with that and will be available at the time

## 2023-05-30 NOTE — Assessment & Plan Note (Signed)
Reviewed prior extensive work up for dizziness 2015 with Dr Sherryll Burger. MRA head and MRI brain obtained 05/2014. Advised follow up imaging in setting of symptoms.  Declines follow up dr Sherryll Burger, neuroimaging and prefers to trial stop hydrochlorothiazide first. Will discuss at follow up and strongly advise if symptoms are not resolved.

## 2023-06-02 NOTE — Assessment & Plan Note (Signed)
Question if dizziness r/t hypotension in the setting of intentional weight loss.  In the setting of hypokalemia, advised to stop hydrochlorothiazide completely Advised to stop potassium chloride. Added hydrochlorothiazide to allergy list and would be cautious about reuse. Continue losartan 50mg  every day.

## 2023-06-16 DIAGNOSIS — H524 Presbyopia: Secondary | ICD-10-CM | POA: Diagnosis not present

## 2023-06-19 ENCOUNTER — Ambulatory Visit
Admission: RE | Admit: 2023-06-19 | Discharge: 2023-06-19 | Disposition: A | Discharge status: Home / Self Care | Payer: 59 | Source: Ambulatory Visit | Attending: Pain Medicine | Admitting: Pain Medicine

## 2023-06-19 ENCOUNTER — Encounter: Payer: Self-pay | Admitting: Pain Medicine

## 2023-06-19 ENCOUNTER — Ambulatory Visit: Payer: 59 | Attending: Pain Medicine | Admitting: Pain Medicine

## 2023-06-19 VITALS — BP 144/86 | HR 75 | Temp 98.0°F | Resp 16 | Ht 63.0 in | Wt 238.0 lb

## 2023-06-19 DIAGNOSIS — M5136 Other intervertebral disc degeneration, lumbar region: Secondary | ICD-10-CM | POA: Insufficient documentation

## 2023-06-19 DIAGNOSIS — M5137 Other intervertebral disc degeneration, lumbosacral region: Secondary | ICD-10-CM | POA: Diagnosis not present

## 2023-06-19 DIAGNOSIS — M4310 Spondylolisthesis, site unspecified: Secondary | ICD-10-CM

## 2023-06-19 DIAGNOSIS — G8929 Other chronic pain: Secondary | ICD-10-CM

## 2023-06-19 DIAGNOSIS — M5459 Other low back pain: Secondary | ICD-10-CM | POA: Diagnosis not present

## 2023-06-19 DIAGNOSIS — M51379 Other intervertebral disc degeneration, lumbosacral region without mention of lumbar back pain or lower extremity pain: Secondary | ICD-10-CM

## 2023-06-19 DIAGNOSIS — M47816 Spondylosis without myelopathy or radiculopathy, lumbar region: Secondary | ICD-10-CM | POA: Diagnosis not present

## 2023-06-19 DIAGNOSIS — M51369 Other intervertebral disc degeneration, lumbar region without mention of lumbar back pain or lower extremity pain: Secondary | ICD-10-CM

## 2023-06-19 DIAGNOSIS — M47817 Spondylosis without myelopathy or radiculopathy, lumbosacral region: Secondary | ICD-10-CM | POA: Diagnosis not present

## 2023-06-19 DIAGNOSIS — M545 Low back pain, unspecified: Secondary | ICD-10-CM | POA: Insufficient documentation

## 2023-06-19 MED ORDER — TRIAMCINOLONE ACETONIDE 40 MG/ML IJ SUSP
80.0000 mg | Freq: Once | INTRAMUSCULAR | Status: AC
  Administered 2023-06-19: 80 mg

## 2023-06-19 MED ORDER — FENTANYL CITRATE (PF) 100 MCG/2ML IJ SOLN
INTRAMUSCULAR | Status: AC
  Filled 2023-06-19: qty 2

## 2023-06-19 MED ORDER — MIDAZOLAM HCL 5 MG/5ML IJ SOLN
0.5000 mg | Freq: Once | INTRAMUSCULAR | Status: AC
  Administered 2023-06-19: 4 mg via INTRAVENOUS

## 2023-06-19 MED ORDER — TRIAMCINOLONE ACETONIDE 40 MG/ML IJ SUSP
INTRAMUSCULAR | Status: AC
  Filled 2023-06-19: qty 2

## 2023-06-19 MED ORDER — ROPIVACAINE HCL 2 MG/ML IJ SOLN
18.0000 mL | Freq: Once | INTRAMUSCULAR | Status: AC
  Administered 2023-06-19: 18 mL via PERINEURAL

## 2023-06-19 MED ORDER — LIDOCAINE HCL 2 % IJ SOLN
INTRAMUSCULAR | Status: AC
  Filled 2023-06-19: qty 20

## 2023-06-19 MED ORDER — LACTATED RINGERS IV SOLN: Freq: Once | INTRAVENOUS | Status: DC

## 2023-06-19 MED ORDER — MIDAZOLAM HCL 5 MG/5ML IJ SOLN
INTRAMUSCULAR | Status: AC
  Filled 2023-06-19: qty 5

## 2023-06-19 MED ORDER — ROPIVACAINE HCL 2 MG/ML IJ SOLN
INTRAMUSCULAR | Status: AC
  Filled 2023-06-19: qty 20

## 2023-06-19 MED ORDER — FENTANYL CITRATE (PF) 100 MCG/2ML IJ SOLN
25.0000 ug | INTRAMUSCULAR | Status: DC | PRN
  Administered 2023-06-19: 50 ug via INTRAVENOUS

## 2023-06-19 MED ORDER — PENTAFLUOROPROP-TETRAFLUOROETH EX AERO
INHALATION_SPRAY | Freq: Once | CUTANEOUS | Status: AC
  Administered 2023-06-19: 30 via TOPICAL
  Filled 2023-06-19: qty 116

## 2023-06-19 MED ORDER — LIDOCAINE HCL 2 % IJ SOLN
20.0000 mL | Freq: Once | INTRAMUSCULAR | Status: AC
  Administered 2023-06-19: 400 mg

## 2023-06-19 NOTE — Patient Instructions (Signed)

## 2023-06-19 NOTE — Progress Notes (Signed)
PROVIDER NOTE: Interpretation of information contained herein should be left to medically-trained personnel. Specific patient instructions are provided elsewhere under "Patient Instructions" section of medical record. This document was created in part using STT-dictation technology, any transcriptional errors that may result from this process are unintentional.  Patient: Jacqueline Delgado Type: Established DOB: 26-Apr-1951 MRN: [id number] PCP: Allegra Grana, FNP  Service: Procedure DOS: 06/19/2023 Setting: Ambulatory Location: Ambulatory outpatient facility Delivery: Face-to-face Provider: Oswaldo Done, MD Specialty: Interventional Pain Management Specialty designation: 09 Location: Outpatient facility Ref. Prov.: Delano Metz, MD       Interventional Therapy   Procedure: Lumbar Facet, Medial Branch Block(s) #4 (first of 2024) w/ mapping Laterality: Bilateral  Level: L3, L4, L5, and S1 Medial Branch Level(s). Injecting these levels blocks the L4-5 and L5-S1 lumbar facet joints. Imaging: Fluoroscopic guidance Spinal ([id number]) Anesthesia: Local anesthesia (1-2% Lidocaine) Anxiolysis: IV Versed 4.0 mg Sedation: Moderate Sedation Fentanyl 1 mL (50 mcg) DOS: 06/19/2023 Performed by: Oswaldo Done, MD  Primary Purpose: Diagnostic/Therapeutic Indications: Low back pain severe enough to impact quality of life or function. 1. Chronic low back pain (Bilateral) (R>L) w/o sciatica   2. Lumbosacral facet syndrome (Bilateral) (R>L)   3. Lumbar facet joint pain   4. Spondylosis without myelopathy or radiculopathy, lumbosacral region   5. Lumbar Grade 1 Anterolisthesis of L4/L5   6. Lumbar facet hypertrophy   7. DDD (degenerative disc disease), lumbosacral    NAS-11 Pain score:   Pre-procedure: 6 /10   Post-procedure: 0-No pain/10     Position / Prep / Materials:  Position: Prone  Prep solution: DuraPrep (Iodine Povacrylex [0.7% available iodine] and Isopropyl Alcohol,  74% w/w) Area Prepped: Posterolateral Lumbosacral Spine (Wide prep: From the lower border of the scapula down to the end of the tailbone and from flank to flank.)  Materials:  Tray: Block Needle(s):  Type: Spinal  Gauge (G): 22  Length: 5-in Qty: 4      H&P (Pre-op Assessment):  Jacqueline Delgado is a 72 y.o. (year old), female patient, seen today for interventional treatment. She  has a past surgical history that includes Joint replacement (Right); Joint replacement; Cholecystectomy; Abdominal hysterectomy; Shoulder arthroscopy with rotator cuff repair and subacromial decompression (Right, 06/14/2015); Esophagogastroduodenoscopy (egd) with propofol (N/A, 10/11/2015); Savory dilation (N/A, 10/11/2015); Colonoscopy w/ polypectomy; Esophagogastroduodenoscopy (egd) with propofol (N/A, 12/10/2017); Colonoscopy with propofol (N/A, 12/10/2017); Total knee arthroplasty (Left, 03/06/2021); Joint replacement (Left); Esophagogastroduodenoscopy (egd) with propofol (N/A, 05/03/2022); Breast surgery; Carpal tunnel release; Trigger finger release; Colonoscopy with propofol (N/A, 04/25/2023); and polypectomy (04/25/2023). Jacqueline Delgado has a current medication list which includes the following prescription(s): aciphex, losartan, naproxen sodium, tirzepatide, acetaminophen, famotidine, fluticasone, and ondansetron, and the following Facility-Administered Medications: fentanyl and lactated ringers. Her primarily concern today is the Back Pain (lower)  Initial Vital Signs:  Pulse/HCG Rate: 75ECG Heart Rate: 66 Temp: 98.2 F (36.8 C) Resp: 16 BP: (!) 133/98 SpO2: 100 %  BMI: Estimated body mass index is 42.16 kg/m as calculated from the following:   Height as of this encounter: 5\' 3"  (1.6 m).   Weight as of this encounter: 238 lb (108 kg).  Risk Assessment: Allergies: Reviewed. She has no active allergies.  Allergy Precautions: None required Coagulopathies: Reviewed. None identified.  Blood-thinner therapy: None at  this time Active Infection(s): Reviewed. None identified. Jacqueline Delgado is afebrile  Site Confirmation: Jacqueline Delgado was asked to confirm the procedure and laterality before marking the site Procedure checklist: Completed Consent: Before the procedure and under  the influence of no sedative(s), amnesic(s), or anxiolytics, the patient was informed of the treatment options, risks and possible complications. To fulfill our ethical and legal obligations, as recommended by the American Medical Association's Code of Ethics, I have informed the patient of my clinical impression; the nature and purpose of the treatment or procedure; the risks, benefits, and possible complications of the intervention; the alternatives, including doing nothing; the risk(s) and benefit(s) of the alternative treatment(s) or procedure(s); and the risk(s) and benefit(s) of doing nothing. The patient was provided information about the general risks and possible complications associated with the procedure. These may include, but are not limited to: failure to achieve desired goals, infection, bleeding, organ or nerve damage, allergic reactions, paralysis, and death. In addition, the patient was informed of those risks and complications associated to Spine-related procedures, such as failure to decrease pain; infection (i.e.: Meningitis, epidural or intraspinal abscess); bleeding (i.e.: epidural hematoma, subarachnoid hemorrhage, or any other type of intraspinal or peri-dural bleeding); organ or nerve damage (i.e.: Any type of peripheral nerve, nerve root, or spinal cord injury) with subsequent damage to sensory, motor, and/or autonomic systems, resulting in permanent pain, numbness, and/or weakness of one or several areas of the body; allergic reactions; (i.e.: anaphylactic reaction); and/or death. Furthermore, the patient was informed of those risks and complications associated with the medications. These include, but are not limited to:  allergic reactions (i.e.: anaphylactic or anaphylactoid reaction(s)); adrenal axis suppression; blood sugar elevation that in diabetics may result in ketoacidosis or comma; water retention that in patients with history of congestive heart failure may result in shortness of breath, pulmonary edema, and decompensation with resultant heart failure; weight gain; swelling or edema; medication-induced neural toxicity; particulate matter embolism and blood vessel occlusion with resultant organ, and/or nervous system infarction; and/or aseptic necrosis of one or more joints. Finally, the patient was informed that Medicine is not an exact science; therefore, there is also the possibility of unforeseen or unpredictable risks and/or possible complications that may result in a catastrophic outcome. The patient indicated having understood very clearly. We have given the patient no guarantees and we have made no promises. Enough time was given to the patient to ask questions, all of which were answered to the patient's satisfaction. Ms. Gershman has indicated that she wanted to continue with the procedure. Attestation: I, the ordering provider, attest that I have discussed with the patient the benefits, risks, side-effects, alternatives, likelihood of achieving goals, and potential problems during recovery for the procedure that I have provided informed consent. Date  Time: 06/19/2023  8:45 AM   Pre-Procedure Preparation:  Monitoring: As per clinic protocol. Respiration, ETCO2, SpO2, BP, heart rate and rhythm monitor placed and checked for adequate function Safety Precautions: Patient was assessed for positional comfort and pressure points before starting the procedure. Time-out: I initiated and conducted the "Time-out" before starting the procedure, as per protocol. The patient was asked to participate by confirming the accuracy of the "Time Out" information. Verification of the correct person, site, and procedure were  performed and confirmed by me, the nursing staff, and the patient. "Time-out" conducted as per Joint Commission's Universal Protocol (UP.01.01.01). Time: 0952 Start Time: 0952 hrs.  Description of Procedure:          Laterality: (see above) Targeted Levels: (see above)  Safety Precautions: Aspiration looking for blood return was conducted prior to all injections. At no point did we inject any substances, as a needle was being advanced. Before injecting, the patient  was told to immediately notify me if she was experiencing any new onset of "ringing in the ears, or metallic taste in the mouth". No attempts were made at seeking any paresthesias. Safe injection practices and needle disposal techniques used. Medications properly checked for expiration dates. SDV (single dose vial) medications used. After the completion of the procedure, all disposable equipment used was discarded in the proper designated medical waste containers. Local Anesthesia: Protocol guidelines were followed. The patient was positioned over the fluoroscopy table. The area was prepped in the usual manner. The time-out was completed. The target area was identified using fluoroscopy. A 12-in long, straight, sterile hemostat was used with fluoroscopic guidance to locate the targets for each level blocked. Once located, the skin was marked with an approved surgical skin marker. Once all sites were marked, the skin (epidermis, dermis, and hypodermis), as well as deeper tissues (fat, connective tissue and muscle) were infiltrated with a small amount of a short-acting local anesthetic, loaded on a 10cc syringe with a 25G, 1.5-in  Needle. An appropriate amount of time was allowed for local anesthetics to take effect before proceeding to the next step. Local Anesthetic: Lidocaine 2.0% The unused portion of the local anesthetic was discarded in the proper designated containers. Technical description of process:   L3 Medial Branch Nerve Block  (MBB): The target area for the L3 medial branch is at the junction of the postero-lateral aspect of the superior articular process and the superior, posterior, and medial edge of the transverse process of L4. Under fluoroscopic guidance, a Quincke needle was inserted until contact was made with os over the superior postero-lateral aspect of the pedicular shadow (target area). After negative aspiration for blood, 0.5 mL of the nerve block solution was injected without difficulty or complication. The needle was removed intact. L4 Medial Branch Nerve Block (MBB): The target area for the L4 medial branch is at the junction of the postero-lateral aspect of the superior articular process and the superior, posterior, and medial edge of the transverse process of L5. Under fluoroscopic guidance, a Quincke needle was inserted until contact was made with os over the superior postero-lateral aspect of the pedicular shadow (target area). After negative aspiration for blood, 0.5 mL of the nerve block solution was injected without difficulty or complication. The needle was removed intact. L5 Medial Branch Nerve Block (MBB): The target area for the L5 medial branch is at the junction of the postero-lateral aspect of the superior articular process and the superior, posterior, and medial edge of the sacral ala. Under fluoroscopic guidance, a Quincke needle was inserted until contact was made with os over the superior postero-lateral aspect of the pedicular shadow (target area). After negative aspiration for blood, 0.5 mL of the nerve block solution was injected without difficulty or complication. The needle was removed intact. S1 Medial Branch Nerve Block (MBB): The target area for the S1 medial branch is at the posterior and inferior 6 o'clock position of the L5-S1 facet joint. Under fluoroscopic guidance, the Quincke needle inserted for the L5 MBB was redirected until contact was made with os over the inferior and postero aspect  of the sacrum, at the 6 o' clock position under the L5-S1 facet joint (Target area). After negative aspiration for blood, 0.5 mL of the nerve block solution was injected without difficulty or complication. The needle was removed intact.  Once the entire procedure was completed, the treated area was cleaned, making sure to leave some of the prepping solution back to  take advantage of its long term bactericidal properties.         Illustration of the posterior view of the lumbar spine and the posterior neural structures. Laminae of L2 through S1 are labeled. DPRL5, dorsal primary ramus of L5; DPRS1, dorsal primary ramus of S1; DPR3, dorsal primary ramus of L3; FJ, facet (zygapophyseal) joint L3-L4; I, inferior articular process of L4; LB1, lateral branch of dorsal primary ramus of L1; IAB, inferior articular branches from L3 medial branch (supplies L4-L5 facet joint); IBP, intermediate branch plexus; MB3, medial branch of dorsal primary ramus of L3; NR3, third lumbar nerve root; S, superior articular process of L5; SAB, superior articular branches from L4 (supplies L4-5 facet joint also); TP3, transverse process of L3.   Facet Joint Innervation (* possible contribution)  L1-2 T12, L1 (L2*)  Medial Branch  L2-3 L1, L2 (L3*)         "          "  L3-4 L2, L3 (L4*)         "          "  L4-5 L3, L4 (L5*)         "          "  L5-S1 L4, L5, S1          "          "    Vitals:   06/19/23 1005 06/19/23 1013 06/19/23 1023 06/19/23 1033  BP: (!) 150/83  (!) 140/82 (!) 144/86  Pulse:      Resp: 16 16 16 16   Temp:  98 F (36.7 C)    TempSrc:      SpO2: 100% 98% 98% 96%  Weight:      Height:         End Time: 1004 hrs.  Imaging Guidance (Spinal):          Type of Imaging Technique: Fluoroscopy Guidance (Spinal) Indication(s): Assistance in needle guidance and placement for procedures requiring needle placement in or near specific anatomical locations not easily accessible without such  assistance. Exposure Time: Please see nurses notes. Contrast: None used. Fluoroscopic Guidance: I was personally present during the use of fluoroscopy. "Tunnel Vision Technique" used to obtain the best possible view of the target area. Parallax error corrected before commencing the procedure. "Direction-depth-direction" technique used to introduce the needle under continuous pulsed fluoroscopy. Once target was reached, antero-posterior, oblique, and lateral fluoroscopic projection used confirm needle placement in all planes. Images permanently stored in EMR. Interpretation: No contrast injected. I personally interpreted the imaging intraoperatively. Adequate needle placement confirmed in multiple planes. Permanent images saved into the patient's record.  Post-operative Assessment:  Post-procedure Vital Signs:  Pulse/HCG Rate: 7563 Temp: 98 F (36.7 C) Resp: 16 BP: (!) 144/86 SpO2: 96 %  EBL: None  Complications: No immediate post-treatment complications observed by team, or reported by patient.  Note: The patient tolerated the entire procedure well. A repeat set of vitals were taken after the procedure and the patient was kept under observation following institutional policy, for this type of procedure. Post-procedural neurological assessment was performed, showing return to baseline, prior to discharge. The patient was provided with post-procedure discharge instructions, including a section on how to identify potential problems. Should any problems arise concerning this procedure, the patient was given instructions to immediately contact us, at any time, without hesitation. In any case, we plan to contact the patient by telephone for a follow-up status report regarding  this interventional procedure.  Comments:  No additional relevant information.  Plan of Care (POC)  Orders:  Orders Placed This Encounter  Procedures   LUMBAR FACET(MEDIAL BRANCH NERVE BLOCK) MBNB    Scheduling  Instructions:     Procedure: Lumbar facet block (AKA.: Lumbosacral medial branch nerve block)     Side: Bilateral     Level: L3-4, L4-5, L5-S1, and TBD Facets (L2, L3, L4, L5, S1, and TBD Medial Branch Nerves)     Sedation: Patient's choice.     Timeframe: Today    Order Specific Question:   Where will this procedure be performed?    Answer:   [facility]   DG PAIN [facility] C-ARM 1-60 MIN NO REPORT    Intraoperative interpretation by procedural physician at [facility].    Standing Status:   Standing    Number of Occurrences:   1    Order Specific Question:   Reason for exam:    Answer:   Assistance in needle guidance and placement for procedures requiring needle placement in or near specific anatomical locations not easily accessible without such assistance.   Informed Consent Details: Physician/Practitioner Attestation; Transcribe to consent form and obtain patient signature    Nursing Order: Transcribe to consent form and obtain patient signature. Note: Always confirm laterality of pain with Ms. Lamke, before procedure.    Order Specific Question:   Physician/Practitioner attestation of informed consent for procedure/surgical case    Answer:   I, the physician/practitioner, attest that I have discussed with the patient the benefits, risks, side effects, alternatives, likelihood of achieving goals and potential problems during recovery for the procedure that I have provided informed consent.    Order Specific Question:   Procedure    Answer:   Lumbar Facet Block  under fluoroscopic guidance    Order Specific Question:   Physician/Practitioner performing the procedure    Answer:   Izaiyah Kleinman A. Laban Emperor MD    Order Specific Question:   Indication/Reason    Answer:   Low Back Pain, with our without leg pain, due to Facet Joint Arthralgia (Joint Pain) Spondylosis (Arthritis of the Spine), without myelopathy or radiculopathy (Nerve Damage).   Provide equipment / supplies at  bedside    Procedure tray: "Block Tray" (Disposable  single use) Skin infiltration needle: Regular 1.5-in, 25-G, (x1) Block Needle type: Spinal Amount/quantity: 4 Size: Medium (5-inch) Gauge: 22G    Standing Status:   Standing    Number of Occurrences:   1    Order Specific Question:   Specify    Answer:   Block Tray   Chronic Opioid Analgesic:  No chronic opioid analgesics therapy prescribed by our practice. Tramadol 50 mg tablet, 1 tab p.o. twice daily (PRN) MME/day: 0-10 mg/day   Medications ordered for procedure: Meds ordered this encounter  Medications   lidocaine (XYLOCAINE) 2 % (with pres) injection 400 mg   pentafluoroprop-tetrafluoroeth (GEBAUERS) aerosol   lactated ringers infusion   midazolam (VERSED) 5 MG/5ML injection 0.5-2 mg    Make sure Flumazenil is available in the pyxis when using this medication. If oversedation occurs, administer 0.2 mg IV over 15 sec. If after 45 sec no response, administer 0.2 mg again over 1 min; may repeat at 1 min intervals; not to exceed 4 doses (1 mg)   fentaNYL (SUBLIMAZE) injection 25-50 mcg    Make sure Narcan is available in the pyxis when using this medication. In the event of respiratory depression (RR< 8/min): Titrate NARCAN (  naloxone) in increments of 0.1 to 0.2 mg IV at 2-3 minute intervals, until desired degree of reversal.   ropivacaine (PF) 2 mg/mL (0.2%) (NAROPIN) injection 18 mL   triamcinolone acetonide (KENALOG-40) injection 80 mg   Medications administered: We administered lidocaine, pentafluoroprop-tetrafluoroeth, midazolam, fentaNYL, ropivacaine (PF) 2 mg/mL (0.2%), and triamcinolone acetonide.  See the medical record for exact dosing, route, and time of administration.  Follow-up plan:   Return in about 2 weeks (around 07/03/2023) for (Face2F), (PPE).       Interventional Therapies  Risk Factors  Considerations:  Cerebrovascular malformation  GERD  HTN MO  OSA  SOB No RFA until BMI is below 35 kg/m.    Planned  Pending:   Diagnostic/therapeutic left IA glenohumeral + AC joint inj. + subacromial bursa inj. #1 (01/09/2023)    Under consideration:   Therapeutic left CESI #2    Completed:   Therapeutic left cervical ESI x1 (11/26/2022) (75/75/50/50) (Neck: 100  (L) shoulder: 50)  Therapeutic/palliative bilateral lumbar facet MBB x3 (08/30/2021) (100/100/100/0) pain worsened after injection. Therapeutic right L4-5 LESI x1 (07/27/2020)  Referral to bariatric surgery and medical weight management entered on 07/18/2020.    Completed by other providers:   EEG x2 by Dr. Cristopher Peru (07/03/14, 07/29/14) - WNL  Diagnostic left occipital nerve block x1 (08/10/2014) by Dr. Cristopher Peru [facility] neurolog[facility])  Therapeutic right rotator cuff repair x1 (06/14/2015) by Neva Seat MD [facility] orthopedics)  Therapeutic left IA Synvisc knee injection x2 (10/02/2020) by Nolon Rod, PA [facility] orthopedics and PMR) Therapeutic left total knee replacement x1 (03/06/2021) by Dr. Kennedy Bucker [facility] orthopedics)   Therapeutic  Palliative (PRN) options:   Palliative bilateral lumbar facet MBB #4  Therapeutic right L4-5 LESI #2        Recent Visits Date Type Provider Dept  05/26/23 Office Visit Delano Metz, MD [facility]  Showing recent visits within past 90 days and meeting all other requirements Today's Visits Date Type Provider Dept  06/19/23 Procedure visit Delano Metz, MD [facility]  Showing today's visits and meeting all other requirements Future Appointments Date Type Provider Dept  07/03/23 Appointment Delano Metz, MD [facility]  Showing future appointments within next 90 days and meeting all other requirements  Disposition: Discharge home  Discharge (Date  Time): 06/19/2023; 1034 hrs.   Primary Care Physician: Allegra Grana, FNP Location: [facility] Note by: Oswaldo Done, MD (TTS technology used. I  apologize for any typographical errors that were not detected and corrected.) Date: 06/19/2023; Time: 11:19 AM  Disclaimer:  Medicine is not an Visual merchandiser. The only guarantee in medicine is that nothing is guaranteed. It is important to note that the decision to proceed with this intervention was based on the information collected from the patient. The Data and conclusions were drawn from the patient's questionnaire, the interview, and the physical examination. Because the information was provided in large part by the patient, it cannot be guaranteed that it has not been purposely or unconsciously manipulated. Every effort has been made to obtain as much relevant data as possible for this evaluation. It is important to note that the conclusions that lead to this procedure are derived in large part from the available data. Always take into account that the treatment will also be dependent on availability of resources and existing treatment guidelines, considered by other Pain Management Practitioners as being common knowledge and practice, at the time of the intervention. For Medico-Legal  purposes, it is also important to point out that variation in procedural techniques and pharmacological choices are the acceptable norm. The indications, contraindications, technique, and results of the above procedure should only be interpreted and judged by a Board-Certified Interventional Pain Specialist with extensive familiarity and expertise in the same exact procedure and technique.

## 2023-06-20 ENCOUNTER — Telehealth: Payer: Self-pay

## 2023-06-20 NOTE — Telephone Encounter (Signed)
Post procedure follow up. Patient states she is doing fine.  

## 2023-06-23 ENCOUNTER — Telehealth: Payer: Self-pay

## 2023-06-23 NOTE — Telephone Encounter (Signed)
States as she bent over, right leg became numb, lasted about 1-2 minutes, then returned to normal. Denies any other symptoms. Advised patient to call if any paralysis of leg, or loss of control of bowel or bladder. Otherwise, apply ice pack and rest. Advised to call if any issues.

## 2023-06-23 NOTE — Telephone Encounter (Signed)
She had a procedure last week and this morning her let went numb. She said this has never happened before and she wants to know what to do. She said she bent over and her back had a pain then her leg went numb. It is not still numb now but she wanted to let you know.

## 2023-06-30 ENCOUNTER — Ambulatory Visit
Admission: RE | Admit: 2023-06-30 | Discharge: 2023-06-30 | Disposition: A | Discharge status: Home / Self Care | Payer: 59 | Source: Ambulatory Visit | Attending: Family | Admitting: Family

## 2023-06-30 DIAGNOSIS — R42 Dizziness and giddiness: Secondary | ICD-10-CM | POA: Insufficient documentation

## 2023-07-03 ENCOUNTER — Ambulatory Visit: Payer: 59 | Admitting: Pain Medicine

## 2023-07-08 ENCOUNTER — Ambulatory Visit (HOSPITAL_BASED_OUTPATIENT_CLINIC_OR_DEPARTMENT_OTHER): Payer: 59 | Admitting: Pain Medicine

## 2023-07-08 DIAGNOSIS — Z09 Encounter for follow-up examination after completed treatment for conditions other than malignant neoplasm: Secondary | ICD-10-CM

## 2023-07-08 DIAGNOSIS — G8929 Other chronic pain: Secondary | ICD-10-CM

## 2023-07-08 NOTE — Progress Notes (Signed)
(  07/08/2023) NO SHOW to post-procedure evaluation. Called and indicated that car broke down.

## 2023-07-10 ENCOUNTER — Encounter: Payer: Self-pay | Admitting: *Deleted

## 2023-07-18 ENCOUNTER — Encounter: Payer: Self-pay | Admitting: Family

## 2023-07-18 ENCOUNTER — Telehealth (INDEPENDENT_AMBULATORY_CARE_PROVIDER_SITE_OTHER): Payer: 59 | Admitting: Family

## 2023-07-18 VITALS — Ht 63.0 in | Wt 226.0 lb

## 2023-07-18 DIAGNOSIS — E041 Nontoxic single thyroid nodule: Secondary | ICD-10-CM | POA: Diagnosis not present

## 2023-07-18 DIAGNOSIS — R42 Dizziness and giddiness: Secondary | ICD-10-CM

## 2023-07-18 DIAGNOSIS — I10 Essential (primary) hypertension: Secondary | ICD-10-CM

## 2023-07-18 NOTE — Assessment & Plan Note (Signed)
Improved.  Reassuring ultrasound carotid arteries.  Patient and I jointly agreed to defer scheduling follow-up with neurology in the absence of symptoms.  Suspect weight loss and antihypertensive medications were playing a role.  Will monitor

## 2023-07-18 NOTE — Assessment & Plan Note (Signed)
No longer on hydrochlorothiazide.  Compliant with 50 mg losartan daily.  Patient will monitor blood pressure at home.

## 2023-07-18 NOTE — Progress Notes (Signed)
Virtual Visit via Video Note  I connected with Jacqueline Delgado on 07/18/23 at 11:00 AM EDT by a video enabled telemedicine application and verified that I am speaking with the correct person using two identifiers. Location patient: home Location provider: work  Persons participating in the virtual visit: patient, provider  I discussed the limitations of evaluation and management by telemedicine and the availability of in person appointments. The patient expressed understanding and agreed to proceed.  HPI: Follow-up dizziness. She  stopped potassium chloride, hydrochlorothiazide. Dizziness has resolved. No loc, confusion.   Compliant with losartan 50 mg daily.  Blood pressure machine is broken, she is purchasing a new one.   History of cerebral vascular malformation, previously followed with neurology  She is excited with weight loss. She is eating less , eating healthier foods, and less portion sizes.   No constipation.   Compliant with mounjaro 10mg    ROS: See pertinent positives and negatives per HPI.  EXAM:  VITALS per patient if applicable: Ht 5\' 3"  (1.6 m)   Wt 226 lb (102.5 kg)   BMI 40.03 kg/m  BP Readings from Last 3 Encounters:  06/19/23 (!) 144/86  05/26/23 110/77  04/25/23 (!) 111/59   Wt Readings from Last 3 Encounters:  07/18/23 226 lb (102.5 kg)  06/19/23 238 lb (108 kg)  05/30/23 238 lb (108 kg)    GENERAL: alert, oriented, appears well and in no acute distress  HEENT: atraumatic, conjunttiva clear, no obvious abnormalities on inspection of external nose and ears  NECK: normal movements of the head and neck  LUNGS: on inspection no signs of respiratory distress, breathing rate appears normal, no obvious gross SOB, gasping or wheezing  CV: no obvious cyanosis  MS: moves all visible extremities without noticeable abnormality  PSYCH/NEURO: pleasant and cooperative, no obvious depression or anxiety, speech and thought processing grossly  intact  ASSESSMENT AND PLAN: Thyroid nodule -     US THYROID; Future  Essential hypertension Assessment & Plan: No longer on hydrochlorothiazide.  Compliant with 50 mg losartan daily.  Patient will monitor blood pressure at home.   Orders: -     Basic metabolic panel; Future -     Hemoglobin A1c; Future  Morbid obesity (HCC) Assessment & Plan: Congratulated patient on weight loss.  She is tolerating Mounjaro 10mg  well.    Orders: -     Hemoglobin A1c; Future  Dizziness Assessment & Plan: Improved.  Reassuring ultrasound carotid arteries.  Patient and I jointly agreed to defer scheduling follow-up with neurology in the absence of symptoms.  Suspect weight loss and antihypertensive medications were playing a role.  Will monitor      -we discussed possible serious and likely etiologies, options for evaluation and workup, limitations of telemedicine visit vs in person visit, treatment, treatment risks and precautions. Pt prefers to treat via telemedicine empirically rather then risking or undertaking an in person visit at this moment.    I discussed the assessment and treatment plan with the patient. The patient was provided an opportunity to ask questions and all were answered. The patient agreed with the plan and demonstrated an understanding of the instructions.   The patient was advised to call back or seek an in-person evaluation if the symptoms worsen or if the condition fails to improve as anticipated.  Advised if desired AVS can be mailed or viewed via MyChart if Mychart user.   Rennie Plowman, FNP

## 2023-07-18 NOTE — Patient Instructions (Addendum)
If dizziness recurs, please let me know so we can arrange consult with neurology.   Ordered ultrasound of your thyroid  Let us know if you dont hear back within a week in regards to an appointment being scheduled.   So that you are aware, if you are [facility] MyChart user , please pay attention to your MyChart messages as you may receive a MyChart message with a phone number to call and schedule this test/appointment own your own from our referral coordinator. This is a new process so I do not want you to miss this message.  If you are not a MyChart user, you will receive a phone call.   So excited for your weight loss - congratulations!

## 2023-07-18 NOTE — Assessment & Plan Note (Signed)
Congratulated patient on weight loss.  She is tolerating Mounjaro 10mg  well.

## 2023-07-24 ENCOUNTER — Ambulatory Visit: Payer: 59

## 2023-07-25 ENCOUNTER — Other Ambulatory Visit: Payer: 59

## 2023-07-25 ENCOUNTER — Other Ambulatory Visit (INDEPENDENT_AMBULATORY_CARE_PROVIDER_SITE_OTHER): Payer: 59

## 2023-07-25 DIAGNOSIS — I10 Essential (primary) hypertension: Secondary | ICD-10-CM | POA: Diagnosis not present

## 2023-07-25 NOTE — Addendum Note (Signed)
Addended by: Warden Fillers on: 07/25/2023 09:19 AM   Modules accepted: Orders

## 2023-07-26 LAB — BASIC METABOLIC PANEL
BUN/Creatinine Ratio: 13 (ref 12–28)
BUN: 13 mg/dL (ref 8–27)
CO2: 21 mmol/L (ref 20–29)
Calcium: 9.9 mg/dL (ref 8.7–10.3)
Chloride: 106 mmol/L (ref 96–106)
Creatinine, Ser: 0.97 mg/dL (ref 0.57–1.00)
Glucose: 89 mg/dL (ref 70–99)
Potassium: 3.7 mmol/L (ref 3.5–5.2)
Sodium: 143 mmol/L (ref 134–144)
eGFR: 62 mL/min/{1.73_m2} (ref >59)

## 2023-07-26 LAB — HEMOGLOBIN A1C
Est. average glucose Bld gHb Est-mCnc: 105 mg/dL
Hgb A1c MFr Bld: 5.3 % (ref 4.8–5.6)

## 2023-07-28 ENCOUNTER — Encounter: Payer: Self-pay | Admitting: Cardiology

## 2023-07-28 ENCOUNTER — Ambulatory Visit: Payer: 59 | Attending: Cardiology | Admitting: Cardiology

## 2023-07-28 VITALS — BP 136/84 | HR 64 | Ht 64.0 in | Wt 225.2 lb

## 2023-07-28 DIAGNOSIS — R072 Precordial pain: Secondary | ICD-10-CM

## 2023-07-28 DIAGNOSIS — Z6838 Body mass index (BMI) 38.0-38.9, adult: Secondary | ICD-10-CM

## 2023-07-28 DIAGNOSIS — I10 Essential (primary) hypertension: Secondary | ICD-10-CM

## 2023-07-28 MED ORDER — METOPROLOL TARTRATE 100 MG PO TABS
100.0000 mg | ORAL_TABLET | Freq: Once | ORAL | 0 refills | Status: DC
Start: 2023-07-28 — End: 2023-09-09

## 2023-07-28 NOTE — Progress Notes (Signed)
Cardiology Office Note:    Date:  07/28/2023   ID:  Jacqueline Delgado, DOB September 20, 1951, MRN [id number]  PCP:  Allegra Grana, FNP  Cardiologist:  Debbe Odea, MD  Electrophysiologist:  None   Referring MD: Allegra Grana, FNP   Chief Complaint  Patient presents with   Follow-up    Patient last seen in clinic in 2022.  Patient reports constant left side chest pain that started yesterday.      History of Present Illness:    Jacqueline Delgado is a 72 y.o. female with a hx of hypertension, obesity, GERD who presents due to chest pain and dizziness.  Previously seen with symptoms of hypertension, was previously prescribed HCTZ and potassium.  Was having dizziness lately, HCTZ and potassium stopped with improvement in symptoms.  Potassium also normalized.  Yesterday and today, she developed left-sided chest pain, symptoms were nonexertional.  Supposed to take Aciphex twice daily for heartburn symptoms, currently takes only daily.  Prior notes Echocardiogram on 12/20/2019 showed normal systolic and diastolic function EF 60 to 65%.     Past Medical History:  Diagnosis Date   Anemia    H/O   Anginal pain (HCC)     3/8-12/21/10   Arthritis    Osteoarthritis in BLE knee   Chronic pain syndrome    Colon polyp    Depression    GERD (gastroesophageal reflux disease)    Hiatal hernia    History of hiatal hernia    History of methicillin resistant staphylococcus aureus (MRSA)    Hypertension    controlled   Obesity    Pre-diabetes    Reflux    Sleep apnea    NO CPAP-SLEEP STUDY FROM 2021 WAS NEGATIVE PER PT   Small vessel disease (HCC)     Past Surgical History:  Procedure Laterality Date   ABDOMINAL HYSTERECTOMY     partial; per patient has left ovary   BREAST SURGERY     CARPAL TUNNEL RELEASE     CHOLECYSTECTOMY     COLONOSCOPY W/ POLYPECTOMY     adenomatous colon polyp   COLONOSCOPY WITH PROPOFOL N/A 12/10/2017   Procedure: COLONOSCOPY WITH PROPOFOL;  Surgeon:  Scot Jun, MD;  Location: [facility] ENDOSCOPY;  Service: Endoscopy;  Laterality: N/A;   COLONOSCOPY WITH PROPOFOL N/A 04/25/2023   Procedure: COLONOSCOPY WITH PROPOFOL;  Surgeon: Regis Bill, MD;  Location: [facility] ENDOSCOPY;  Service: Endoscopy;  Laterality: N/A;   ESOPHAGOGASTRODUODENOSCOPY (EGD) WITH PROPOFOL N/A 10/11/2015   Procedure: ESOPHAGOGASTRODUODENOSCOPY (EGD) WITH PROPOFOL;  Surgeon: Scot Jun, MD;  Location[facility] ENDOSCOPY;  Service: Endoscopy;  Laterality: N/A;   ESOPHAGOGASTRODUODENOSCOPY (EGD) WITH PROPOFOL N/A 12/10/2017   Procedure: ESOPHAGOGASTRODUODENOSCOPY (EGD) WITH PROPOFOL;  Surgeon: Scot Jun, MD;  Location: [facility] ENDOSCOPY;  Service: Endoscopy;  Laterality: N/A;   ESOPHAGOGASTRODUODENOSCOPY (EGD) WITH PROPOFOL N/A 05/03/2022   Procedure: ESOPHAGOGASTRODUODENOSCOPY (EGD) WITH PROPOFOL;  Surgeon: Regis Bill, MD;  Location: [facility] ENDOSCOPY;  Service: Endoscopy;  Laterality: N/A;   JOINT REPLACEMENT Right    knee, Oct 2012   JOINT REPLACEMENT     hopefully getting left partial knee replacement in July 2016   JOINT REPLACEMENT Left    03/06/21   POLYPECTOMY  04/25/2023   Procedure: POLYPECTOMY;  Surgeon: Regis Bill, MD;  Location: [facility] ENDOSCOPY;  Service: Endoscopy;;   SAVORY DILATION N/A 10/11/2015   Procedure: Gaspar Bidding DILATION;  Surgeon: Scot Jun, MD;  Location: [facility] ENDOSCOPY;  Service: Endoscopy;  Laterality: N/A;  SHOULDER ARTHROSCOPY WITH ROTATOR CUFF REPAIR AND SUBACROMIAL DECOMPRESSION Right 06/14/2015   Procedure: SHOULDER ARTHROSCOPY WITH mini open ROTATOR CUFF REPAIR AND SUBACROMIAL DECOMPRESSION, release long head biceps tendon.;  Surgeon: Erin Sons, MD;  Location: [facility] ORS;  Service: Orthopedics;  Laterality: Right;   TOTAL KNEE ARTHROPLASTY Left 03/06/2021   Procedure: TOTAL KNEE ARTHROPLASTY - [place] Neighbor to Assist;  Surgeon: Kennedy Bucker, MD;  Location: [facility] ORS;  Service: Orthopedics;  Laterality: Left;    TRIGGER FINGER RELEASE       Current Medications: Current Meds  Medication Sig   acetaminophen (TYLENOL) 500 MG tablet Take 500 mg by mouth every 6 (six) hours as needed.   ACIPHEX 20 MG tablet TAKE 1 TABLET (20 MG TOTAL) BY MOUTH IN THE MORNING AND AT BEDTIME.(DAW 1-BRAND NAME)   famotidine (PEPCID) 40 MG tablet Take 40 mg by mouth at bedtime.   fluticasone (FLONASE) 50 MCG/ACT nasal spray PLACE 2 SPRAYS INTO BOTH NOSTRILS DAILY AS NEEDED FOR ALLERGIES OR RHINITIS.   losartan (COZAAR) 50 MG tablet TAKE 1 TABLET BY MOUTH EVERY DAY   metoprolol tartrate (LOPRESSOR) 100 MG tablet Take 1 tablet (100 mg total) by mouth once for 1 dose. TWO HOURS PRIOR TO CARDIAC CT   ondansetron (ZOFRAN-ODT) 4 MG disintegrating tablet TAKE 1 TABLET BY MOUTH EVERY 8 HOURS AS NEEDED FOR NAUSEA AND VOMITING   tirzepatide (MOUNJARO) 10 MG/0.5ML Pen Inject 10 mg into the skin once a week.     Allergies:   Patient has no active allergies.   Social History   Socioeconomic History   Marital status: Widowed    Spouse name: Not on file   Number of children: 3   Years of education: Not on file   Highest education level: Not on file  Occupational History   Occupation: Retired  Tobacco Use   Smoking status: Never   Smokeless tobacco: Never  Vaping Use   Vaping status: Never Used  Substance and Sexual Activity   Alcohol use: No   Drug use: No   Sexual activity: Not on file  Other Topics Concern   Not on file  Social History Narrative   Lost a son on march 8th 20+ years ago.    March through Mother's Day is a very hard time every year.    Works with [employer] until 09/2020   Social Determinants of Health   Financial Resource Strain: Medium Risk (03/04/2023)   Overall Financial Resource Strain (CARDIA)    Difficulty of Paying Living Expenses: Somewhat hard  Food Insecurity: Food Insecurity Present (03/04/2023)   Hunger Vital Sign    Worried About Running Out of Food in the Last Year: Often true    Ran  Out of Food in the Last Year: Often true  Transportation Needs: Unmet Transportation Needs (03/04/2023)   PRAPARE - Administrator, Civil Service (Medical): Yes    Lack of Transportation (Non-Medical): Yes  Physical Activity: Insufficiently Active (03/04/2023)   Exercise Vital Sign    Days of Exercise per Week: 3 days    Minutes of Exercise per Session: 10 min  Stress: Stress Concern Present (03/04/2023)   [employer] of Occupational Health - Occupational Stress Questionnaire    Feeling of Stress : To some extent  Social Connections: Socially Isolated (03/04/2023)   Social Connection and Isolation Panel [NHANES]    Frequency of Communication with Friends and Family: More than three times a week    Frequency of Social Gatherings with Friends and  Family: Once a week    Attends Religious Services: Never    Active Member of Clubs or Organizations: No    Attends Banker Meetings: Never    Marital Status: Widowed     Family History: The patient's family history includes Alcoholism in an other family member; Aneurysm (age of onset: 19) in her sister; Arthritis in an other family member; Breast cancer in her maternal grandmother; Breast cancer (age of onset: 42) in her mother; Colon cancer (age of onset: 67) in her sister; Diabetes in an other family member; Heart disease in her father; Hyperlipidemia in her father; Hypertension in her father; Stroke in her father. There is no history of Thyroid cancer.  ROS:   Please see the history of present illness.     All other systems reviewed and are negative.  EKGs/Labs/Other Studies Reviewed:    The following studies were reviewed today:  EKG Interpretation Date/Time:  Monday July 28 2023 14:57:36 EDT Ventricular Rate:  64 PR Interval:  202 QRS Duration:  86 QT Interval:  378 QTC Calculation: 389 R Axis:   -27  Text Interpretation: Normal sinus rhythm Minimal voltage criteria for LVH, may be normal variant ( R  in aVL ) Cannot rule out Anterior infarct (cited on or before 28-Sep-2018) Confirmed by Debbe Odea ([id number]) on 07/28/2023 3:08:32 PM    Recent Labs: 03/26/2023: ALT 14; TSH 1.96 04/08/2023: Hemoglobin 12.1; Platelets 233 04/09/2023: Magnesium 2.1 07/25/2023: BUN 13; Creatinine, Ser 0.97; Potassium 3.7; Sodium 143  Recent Lipid Panel    Component Value Date/Time   CHOL 142 03/25/2023 0812   CHOL 134 09/13/2020 0912   CHOL 135 06/10/2014 0411   TRIG 91.0 03/25/2023 0812   TRIG 63 06/10/2014 0411   HDL 43.00 03/25/2023 0812   HDL 38 (L) 09/13/2020 0912   HDL 36 (L) 06/10/2014 0411   CHOLHDL 3 03/25/2023 0812   VLDL 18.2 03/25/2023 0812   VLDL 13 06/10/2014 0411   LDLCALC 81 03/25/2023 0812   LDLCALC 83 09/13/2020 0912   LDLCALC 86 06/10/2014 0411    Physical Exam:    VS:  BP 136/84 (BP Location: Left Arm, Patient Position: Sitting, Cuff Size: Large)   Pulse 64   Ht 5\' 4"  (1.626 m)   Wt 225 lb 3.2 oz (102.2 kg)   SpO2 96%   BMI 38.66 kg/m     Wt Readings from Last 3 Encounters:  07/28/23 225 lb 3.2 oz (102.2 kg)  07/18/23 226 lb (102.5 kg)  06/19/23 238 lb (108 kg)     GEN:  Well nourished, well developed in no acute distress HEENT: Normal NECK: No JVD; No carotid bruits CARDIAC: RRR, no murmurs, rubs, gallops RESPIRATORY:  Clear to auscultation without rales, wheezing or rhonchi  ABDOMEN: Soft, non-tender, non-distended MUSCULOSKELETAL: 2+ edema at ankles; No deformity  SKIN: Warm and dry NEUROLOGIC:  Alert and oriented x 3 PSYCHIATRIC:  Normal affect   ASSESSMENT:    1. Precordial pain   2. Primary hypertension   3. BMI 38.0-38.9,adult     PLAN:    In order of problems listed above:  Chest pain, could be related with GERD.  Previous echo with normal EF.  Obtain coronary CTA to rule out CAD.  Advised to take Aciphex twice daily as prescribed.  Continue Pepcid. Hypertension, BP controlled.  Continue losartan 50 mg daily.  Obesity, lost over 50 pounds  since last visit.  Congratulated on weight loss.  Continue low-calorie diet,  On Mounjaro.  Follow-up after coronary CT   Medication Adjustments/Labs and Tests Ordered: Current medicines are reviewed at length with the patient today.  Concerns regarding medicines are outlined above.  Orders Placed This Encounter  Procedures   CT CORONARY MORPH W/CTA COR W/SCORE W/CA W/CM &/OR WO/CM   Basic Metabolic Panel (BMET)   EKG 12-Lead   Meds ordered this encounter  Medications   metoprolol tartrate (LOPRESSOR) 100 MG tablet    Sig: Take 1 tablet (100 mg total) by mouth once for 1 dose. TWO HOURS PRIOR TO CARDIAC CT    Dispense:  1 tablet    Refill:  0    Patient Instructions  Medication Instructions:   Your physician recommends that you continue on your current medications as directed. Please refer to the Current Medication list given to you today.  *If you need a refill on your cardiac medications before your next appointment, please call your pharmacy*   Lab Work:  Your physician recommends you have labs - BMP  If you have labs (blood work) drawn today and your tests are completely normal, you will receive your results only by: MyChart Message (if you have MyChart) OR A paper copy in the mail If you have any lab test that is abnormal or we need to change your treatment, we will call you to review the results.   Testing/Procedures:    Your cardiac CT will be scheduled at one of the below locations:   [facility] [address] [place], [place] [address] [phone]  OR  [facility] [address] [place], [place] [address] [phone]  OR   [facility] [address] [place], [place] [address] [phone]  If scheduled at [facility], please arrive at the [facility] and Children's Entrance (Entrance C2) of [facility] 30 minutes prior to test start time. You can  use the FREE valet parking offered at entrance C (encouraged to control the heart rate for the test)  Proceed to the [place] [facility] Department (first floor) to check-in and test prep.  All radiology patients and guests should use entrance C2 at [facility], accessed from [facility], even though the hospital's physical address listed is [address].    If scheduled at [facility] or [facility], please arrive 15 mins early for check-in and test prep.  There is spacious parking and easy access to the radiology department from the [facility] entrance. Please enter here and check-in with the desk attendant.   Please follow these instructions carefully (unless otherwise directed):  An IV will be required for this test and Nitroglycerin will be given.  Hold all erectile dysfunction medications at least 3 days (72 hrs) prior to test. (Ie viagra, cialis, sildenafil, tadalafil, etc)   On the Night Before the Test: Be sure to Drink plenty of water. Do not consume any caffeinated/decaffeinated beverages or chocolate 12 hours prior to your test. Do not take any antihistamines 12 hours prior to your test.   On the Day of the Test: Drink plenty of water until 1 hour prior to the test. Do not eat any food 1 hour prior to test. You may take your regular medications prior to the test.  Take metoprolol (Lopressor) two hours prior to test. FEMALES- please wear underwire-free bra if available, avoid dresses & tight clothing      After the Test: Drink  plenty of water. After receiving IV contrast, you may experience a mild flushed feeling. This is normal. On occasion, you may experience a mild rash up to 24 hours after the test. This is not dangerous. If this occurs, you can take Benadryl 25 mg and increase your fluid intake. If you experience trouble breathing, this can be serious. If it is severe call 911  IMMEDIATELY. If it is mild, please call our office. If you take any of these medications: Glipizide/Metformin, Avandament, Glucavance, please do not take 48 hours after completing test unless otherwise instructed.  We will call to schedule your test 2-4 weeks out understanding that some insurance companies will need an authorization prior to the service being performed.   For more information and frequently asked questions, please visit our website : [email or web address]  For non-scheduling related questions, please contact the cardiac imaging nurse navigator should you have any questions/concerns: Cardiac Imaging Nurse Navigators Direct Office Dial: [phone]   For scheduling needs, including cancellations and rescheduling, please call [place], [phone].    Follow-Up: At [facility], you and your health needs are our priority.  As part of our continuing mission to provide you with exceptional heart care, we have created designated Provider Care Teams.  These Care Teams include your primary Cardiologist (physician) and Advanced Practice Providers (APPs -  Physician Assistants and Nurse Practitioners) who all work together to provide you with the care you need, when you need it.  We recommend signing up for the patient portal called "MyChart".  Sign up information is provided on this After Visit Summary.  MyChart is used to connect with patients for Virtual Visits (Telemedicine).  Patients are able to view lab/test results, encounter notes, upcoming appointments, etc.  Non-urgent messages can be sent to your provider as well.   To learn more about what you can do with MyChart, go to [email or web address].    Your next appointment:   2 month(s)  Provider:   You may see Debbe Odea, MD or one of the following Advanced Practice Providers on your designated Care Team:   Nicolasa Ducking, NP Eula Listen, PA-C Cadence Fransico Michael, PA-C Charlsie Quest, NP    Signed, Debbe Odea, MD  07/28/2023 4:00 PM    [facility]

## 2023-07-28 NOTE — Patient Instructions (Signed)
Medication Instructions:   Your physician recommends that you continue on your current medications as directed. Please refer to the Current Medication list given to you today.  *If you need a refill on your cardiac medications before your next appointment, please call your pharmacy*   Lab Work:  Your physician recommends you have labs - BMP  If you have labs (blood work) drawn today and your tests are completely normal, you will receive your results only by: MyChart Message (if you have MyChart) OR A paper copy in the mail If you have any lab test that is abnormal or we need to change your treatment, we will call you to review the results.   Testing/Procedures:    Your cardiac CT will be scheduled at one of the below locations:   [facility] [address] [place], [place] [address] [phone]  OR  [facility] [address] [place], [place] [address] [phone]  OR   [facility] [address] [place], [place] [address] [phone]  If scheduled at [facility], please arrive at the [facility] Entrance (Entrance C2) of [facility] 30 minutes prior to test start time. You can use the FREE valet parking offered at entrance C (encouraged to control the heart rate for the test)  Proceed to the [facility] Radiology Department (first floor) to check-in and test prep.  All radiology patients and guests should use entrance C2 at [facility], accessed from [facility], even though the hospital's physical address listed is [address].    If scheduled at [facility] or [facility], please arrive 15 mins early for check-in and test prep.  There is spacious parking and easy access to the radiology department from the [facility] Heart and Vascular entrance. Please enter here and check-in with  the desk attendant.   Please follow these instructions carefully (unless otherwise directed):  An IV will be required for this test and Nitroglycerin will be given.  Hold all erectile dysfunction medications at least 3 days (72 hrs) prior to test. (Ie viagra, cialis, sildenafil, tadalafil, etc)   On the Night Before the Test: Be sure to Drink plenty of water. Do not consume any caffeinated/decaffeinated beverages or chocolate 12 hours prior to your test. Do not take any antihistamines 12 hours prior to your test.   On the Day of the Test: Drink plenty of water until 1 hour prior to the test. Do not eat any food 1 hour prior to test. You may take your regular medications prior to the test.  Take metoprolol (Lopressor) two hours prior to test. FEMALES- please wear underwire-free bra if available, avoid dresses & tight clothing      After the Test: Drink plenty of water. After receiving IV contrast, you may experience a mild flushed feeling. This is normal. On occasion, you may experience a mild rash up to 24 hours after the test. This is not dangerous. If this occurs, you can take Benadryl 25 mg and increase your fluid intake. If you experience trouble breathing, this can be serious. If it is severe call 911 IMMEDIATELY. If it is mild, please call our office. If you take any of these medications: Glipizide/Metformin, Avandament, Glucavance, please do not take 48 hours after completing test unless otherwise instructed.  We will call to schedule your test 2-4 weeks out understanding that some insurance companies will need  an authorization prior to the service being performed.   For more information and frequently asked questions, please visit our website : [email or web address]  For non-scheduling related questions, please contact the cardiac imaging nurse navigator should you have any questions/concerns: Cardiac Imaging Nurse Navigators Direct Office Dial: [phone]    For scheduling needs, including cancellations and rescheduling, please call [place], [phone].    Follow-Up: At [facility], you and your health needs are our priority.  As part of our continuing mission to provide you with exceptional heart care, we have created designated Provider Care Teams.  These Care Teams include your primary Cardiologist (physician) and Advanced Practice Providers (APPs -  Physician Assistants and Nurse Practitioners) who all work together to provide you with the care you need, when you need it.  We recommend signing up for the patient portal called "MyChart".  Sign up information is provided on this After Visit Summary.  MyChart is used to connect with patients for Virtual Visits (Telemedicine).  Patients are able to view lab/test results, encounter notes, upcoming appointments, etc.  Non-urgent messages can be sent to your provider as well.   To learn more about what you can do with MyChart, go to [email or web address].    Your next appointment:   2 month(s)  Provider:   You may see Debbe Odea, MD or one of the following Advanced Practice Providers on your designated Care Team:   Nicolasa Ducking, NP Eula Listen, PA-C Cadence Fransico Michael, PA-C Charlsie Quest, NP

## 2023-07-29 ENCOUNTER — Telehealth (HOSPITAL_COMMUNITY): Payer: Self-pay | Admitting: Emergency Medicine

## 2023-07-29 LAB — BASIC METABOLIC PANEL
BUN/Creatinine Ratio: 12 (ref 12–28)
BUN: 12 mg/dL (ref 8–27)
CO2: 24 mmol/L (ref 20–29)
Calcium: 9.8 mg/dL (ref 8.7–10.3)
Chloride: 108 mmol/L — ABNORMAL HIGH (ref 96–106)
Creatinine, Ser: 1.02 mg/dL — ABNORMAL HIGH (ref 0.57–1.00)
Glucose: 82 mg/dL (ref 70–99)
Potassium: 3.5 mmol/L (ref 3.5–5.2)
Sodium: 145 mmol/L — ABNORMAL HIGH (ref 134–144)
eGFR: 59 mL/min/{1.73_m2} — ABNORMAL LOW (ref >59)

## 2023-07-29 NOTE — Telephone Encounter (Signed)
Attempted to call patient regarding upcoming cardiac CT appointment. °Left message on voicemail with name and callback number °Dwan Fennel RN Navigator Cardiac Imaging °[facility] °[phone] Office °[phone] Cell ° °

## 2023-07-30 ENCOUNTER — Other Ambulatory Visit: Payer: Self-pay | Admitting: Family

## 2023-07-30 DIAGNOSIS — I10 Essential (primary) hypertension: Secondary | ICD-10-CM

## 2023-07-31 ENCOUNTER — Ambulatory Visit
Admission: RE | Admit: 2023-07-31 | Discharge: 2023-07-31 | Disposition: A | Discharge status: Home / Self Care | Payer: 59 | Source: Ambulatory Visit | Attending: Family | Admitting: Family

## 2023-07-31 ENCOUNTER — Ambulatory Visit (HOSPITAL_BASED_OUTPATIENT_CLINIC_OR_DEPARTMENT_OTHER)
Admission: RE | Admit: 2023-07-31 | Discharge: 2023-07-31 | Disposition: A | Discharge status: Home / Self Care | Payer: 59 | Source: Ambulatory Visit | Attending: Cardiology | Admitting: Cardiology

## 2023-07-31 DIAGNOSIS — Z6838 Body mass index (BMI) 38.0-38.9, adult: Secondary | ICD-10-CM | POA: Insufficient documentation

## 2023-07-31 DIAGNOSIS — R072 Precordial pain: Secondary | ICD-10-CM | POA: Insufficient documentation

## 2023-07-31 DIAGNOSIS — E041 Nontoxic single thyroid nodule: Secondary | ICD-10-CM | POA: Diagnosis not present

## 2023-07-31 DIAGNOSIS — E669 Obesity, unspecified: Secondary | ICD-10-CM | POA: Diagnosis not present

## 2023-07-31 DIAGNOSIS — I10 Essential (primary) hypertension: Secondary | ICD-10-CM | POA: Insufficient documentation

## 2023-07-31 DIAGNOSIS — E042 Nontoxic multinodular goiter: Secondary | ICD-10-CM | POA: Diagnosis not present

## 2023-07-31 MED ORDER — NITROGLYCERIN 0.4 MG SL SUBL
0.8000 mg | SUBLINGUAL_TABLET | Freq: Once | SUBLINGUAL | Status: AC
  Administered 2023-07-31: 0.8 mg via SUBLINGUAL

## 2023-07-31 MED ORDER — IOHEXOL 350 MG/ML SOLN
100.0000 mL | Freq: Once | INTRAVENOUS | Status: AC | PRN
  Administered 2023-07-31: 100 mL via INTRAVENOUS

## 2023-07-31 MED ORDER — METOPROLOL TARTRATE 5 MG/5ML IV SOLN
10.0000 mg | Freq: Once | INTRAVENOUS | Status: AC
  Administered 2023-07-31: 10 mg via INTRAVENOUS

## 2023-07-31 MED ORDER — SODIUM CHLORIDE 0.9 % IV SOLN: INTRAVENOUS | Status: DC

## 2023-07-31 NOTE — Progress Notes (Signed)
Pt tolerated procedure well with no issues. Pt ABCs intact. Pt denies any complaints. Pt encouraged to drink plenty of water throughout the day. Pt ambulatory with steady gait.

## 2023-08-04 ENCOUNTER — Inpatient Hospital Stay: Admission: RE | Admit: 2023-08-04 | Payer: 59 | Source: Ambulatory Visit

## 2023-08-22 ENCOUNTER — Telehealth: Payer: Self-pay | Admitting: Family

## 2023-08-22 DIAGNOSIS — E041 Nontoxic single thyroid nodule: Secondary | ICD-10-CM

## 2023-08-22 NOTE — Telephone Encounter (Signed)
Patient called and said that Arnett wanted her to have some kind of test on her thyroid. No one has called her.

## 2023-08-25 NOTE — Addendum Note (Signed)
Addended by: Allegra Grana on: 08/25/2023 10:26 AM   Modules accepted: Orders

## 2023-08-25 NOTE — Telephone Encounter (Signed)
Call patient Please let her know that I placed a referral to endocrinology.  It is not an urgent referral.  She may be seen in the next 3 to 4 months.  Please ask her to send a MyChart message if she does not get a phone call in the next couple weeks with an appointment scheduled however the appointment may not be for another few months and that is acceptable

## 2023-08-25 NOTE — Telephone Encounter (Signed)
Spoke to pt and let her know that I placed a referral to endocrinology.  It is not an urgent referral.  She may be seen in the next 3 to 4 months.  Please ask her to send a MyChart message if she does not get a phone call in the next couple weeks with an appointment scheduled however the appointment may not be for another few months and that is acceptable Pt verbalized understanding

## 2023-09-08 ENCOUNTER — Telehealth: Payer: Self-pay | Admitting: Family

## 2023-09-08 NOTE — Telephone Encounter (Signed)
Pt called stating she thought that the provider was going to increase her mounjaro so that she would be able to get a refill

## 2023-09-09 MED ORDER — TIRZEPATIDE 12.5 MG/0.5ML ~~LOC~~ SOAJ: 12.5000 mg | SUBCUTANEOUS | 2 refills | Status: DC

## 2023-09-09 NOTE — Telephone Encounter (Signed)
Patient states she is following-up on her previous call. I read Margaret's message to patient.  I scheduled a lab visit for patient on 10/31/2023.  Patient states she has been denied burial insurance because of incorrect medications listed on her file.  Patient states she would like to be sure we have a corrected medication list for her on file.

## 2023-09-09 NOTE — Addendum Note (Signed)
Addended by: Allegra Grana on: 09/09/2023 11:41 AM   Modules accepted: Orders

## 2023-09-09 NOTE — Telephone Encounter (Signed)
Jacqueline Delgado, what is status of endocrine referral?   [facility] team  Please call patient let her know that I sent in higher dose of Mounjaro 12.5 mg weekly to aid in weight loss  Please ensure she has a follow-up scheduled with me approx 90 days from last a1c

## 2023-09-25 NOTE — Telephone Encounter (Signed)
Spoke to pt and she stated that she needs ANY medication that she is NOT taking or has NEVER taken even though it was prescribed she needs it to come out her chart? For insurance purposes

## 2023-09-26 NOTE — Telephone Encounter (Signed)
Spoke to pt and gave her the number to Medical Records pt stated she would call back if she needed to

## 2023-10-02 ENCOUNTER — Ambulatory Visit: Payer: 59 | Admitting: Cardiology

## 2023-10-20 ENCOUNTER — Other Ambulatory Visit: Payer: Self-pay

## 2023-10-20 DIAGNOSIS — E041 Nontoxic single thyroid nodule: Secondary | ICD-10-CM

## 2023-10-21 ENCOUNTER — Telehealth: Payer: Self-pay | Admitting: Family

## 2023-10-21 DIAGNOSIS — I10 Essential (primary) hypertension: Secondary | ICD-10-CM

## 2023-10-21 DIAGNOSIS — R7303 Prediabetes: Secondary | ICD-10-CM

## 2023-10-21 DIAGNOSIS — R899 Unspecified abnormal finding in specimens from other organs, systems and tissues: Secondary | ICD-10-CM

## 2023-10-21 NOTE — Telephone Encounter (Signed)
 Spoke to pt and she would like to get her A1C,Potassium, and her urine checked due to (odor) . She stated that she has labs to get done on Thursday 10/23/23 in [place] [facility] and wants to know why she can not get them done here . Pt does not drive a lot and does not want to go to [facility] if she can avoid it

## 2023-10-21 NOTE — Telephone Encounter (Signed)
 Patient need lab orders.

## 2023-10-22 ENCOUNTER — Telehealth: Payer: Self-pay | Admitting: "Endocrinology

## 2023-10-22 NOTE — Telephone Encounter (Signed)
 Spoke to pt and informed her that she can get lab work done here as long as Endocrine changes orders to future and other lab orders that she requested are already in

## 2023-10-22 NOTE — Addendum Note (Signed)
 Addended by: [place], Arsenio Schnorr on: 10/22/2023 08:48 AM   Modules accepted: Orders

## 2023-10-22 NOTE — Telephone Encounter (Signed)
 Patient has requested that her labs be ordered so that she can have them done at her [place] PCP office (instead of coming to [place]).

## 2023-10-23 ENCOUNTER — Other Ambulatory Visit: Payer: 59

## 2023-10-29 ENCOUNTER — Ambulatory Visit: Payer: 59 | Admitting: "Endocrinology

## 2023-10-31 ENCOUNTER — Telehealth: Payer: Self-pay

## 2023-10-31 ENCOUNTER — Other Ambulatory Visit: Payer: 59

## 2023-10-31 NOTE — Telephone Encounter (Signed)
Rasheedah- what is status of endocrine referral? Can you update pt?  Call pt  Okay to cancel labs  Please ensure she has in person follow up in the next few months  Advise I am checking on status of endocrine referral due to thyroid nodule

## 2023-10-31 NOTE — Telephone Encounter (Signed)
See below telephone note re endo referral

## 2023-10-31 NOTE — Telephone Encounter (Signed)
Copied from CRM [id number]. Topic: Clinical - Medical Advice >> Oct 31, 2023 12:49 PM Elizebeth Brooking wrote: Reason for CRM: Patient called in regarding the labs, stated to cancel her labs for today. As she's not sure what labs she would need in order to be seen by ENDOCRINOLOGY she is asking for someone to give her a callback on all what labs she needs for that upcoming appointment and then she will schedule a lab visit appointment

## 2023-11-03 NOTE — Telephone Encounter (Signed)
noted 

## 2023-11-17 ENCOUNTER — Telehealth: Payer: Self-pay

## 2023-11-17 NOTE — Telephone Encounter (Signed)
Copied from CRM [id number]. Topic: Clinical - Medication Question >> Nov 17, 2023 11:12 AM Sonny Dandy B wrote: Reason for CRM: Pt called to speak with Janette, pt is requesting a call back regarding labs.

## 2023-11-17 NOTE — Telephone Encounter (Signed)
Spoke to pt and she is still waiting to hear back from Endocrine regarding referral and would like to get labs done at same time because she has to be fasting

## 2023-11-17 NOTE — Telephone Encounter (Signed)
Spoke to pt and she stated that she may just have to cancel labs  if they can not see them because she does not drive and does not want to go to two different places

## 2023-11-17 NOTE — Telephone Encounter (Signed)
Pt is scheduled for endocrine 12/09/23 Please make her aware  Labs need to be here, I dont know if endocrine can see my orders so I recommend having labs done here

## 2023-11-19 ENCOUNTER — Telehealth: Payer: Self-pay

## 2023-11-19 DIAGNOSIS — Z9660 Presence of unspecified orthopedic joint implant: Secondary | ICD-10-CM

## 2023-11-19 MED ORDER — AMOXICILLIN 500 MG PO TABS
2000.0000 mg | ORAL_TABLET | Freq: Once | ORAL | 1 refills | Status: AC
Start: 2023-11-19 — End: 2023-11-19

## 2023-11-19 NOTE — Telephone Encounter (Signed)
 Copied from CRM [id number]. Topic: General - Other >> Nov 19, 2023  8:14 AM Howard Macho wrote: Reason for CRM: patient called stating she has a dentist appointment today at 10:30 and she would like to see if she can get some amoxicillin  sent to her pharmacy

## 2023-11-19 NOTE — Addendum Note (Signed)
Addended by: Allegra Grana on: 11/19/2023 08:48 AM   Modules accepted: Orders

## 2023-11-19 NOTE — Telephone Encounter (Signed)
 Call pt  Amoxicillin  sent with one refill

## 2023-11-20 ENCOUNTER — Other Ambulatory Visit (INDEPENDENT_AMBULATORY_CARE_PROVIDER_SITE_OTHER): Payer: 59

## 2023-11-20 DIAGNOSIS — R7303 Prediabetes: Secondary | ICD-10-CM | POA: Diagnosis not present

## 2023-11-20 DIAGNOSIS — I10 Essential (primary) hypertension: Secondary | ICD-10-CM

## 2023-11-21 ENCOUNTER — Encounter: Payer: Self-pay | Admitting: Cardiology

## 2023-11-21 ENCOUNTER — Ambulatory Visit: Payer: 59 | Attending: Cardiology | Admitting: Cardiology

## 2023-11-21 VITALS — BP 126/86 | HR 80 | Ht 63.0 in | Wt 204.0 lb

## 2023-11-21 DIAGNOSIS — I10 Essential (primary) hypertension: Secondary | ICD-10-CM

## 2023-11-21 DIAGNOSIS — R072 Precordial pain: Secondary | ICD-10-CM

## 2023-11-21 DIAGNOSIS — Z6836 Body mass index (BMI) 36.0-36.9, adult: Secondary | ICD-10-CM

## 2023-11-21 LAB — URINALYSIS, ROUTINE W REFLEX MICROSCOPIC
Hgb urine dipstick: NEGATIVE
Nitrite: NEGATIVE
RBC / HPF: NONE SEEN (ref 0–?)
Specific Gravity, Urine: >=1.03 — AB (ref 1.000–1.030)
Total Protein, Urine: 30 — AB
Urine Glucose: NEGATIVE
Urobilinogen, UA: 2 — AB (ref 0.0–1.0)
pH: 6 (ref 5.0–8.0)

## 2023-11-21 LAB — BASIC METABOLIC PANEL
BUN: 15 mg/dL (ref 6–23)
CO2: 21 meq/L (ref 19–32)
Calcium: 9.2 mg/dL (ref 8.4–10.5)
Chloride: 109 meq/L (ref 96–112)
Creatinine, Ser: 0.96 mg/dL (ref 0.40–1.20)
GFR: 59.41 mL/min — ABNORMAL LOW (ref >60.00)
Glucose, Bld: 68 mg/dL — ABNORMAL LOW (ref 70–99)
Potassium: 3.6 meq/L (ref 3.5–5.1)
Sodium: 143 meq/L (ref 135–145)

## 2023-11-21 LAB — HEMOGLOBIN A1C: Hgb A1c MFr Bld: 5.1 % (ref 4.6–6.5)

## 2023-11-21 NOTE — Patient Instructions (Signed)
 Medication Instructions:   Your physician recommends that you continue on your current medications as directed. Please refer to the Current Medication list given to you today.  *If you need a refill on your cardiac medications before your next appointment, please call your pharmacy*   Lab Work:  None Ordered  If you have labs (blood work) drawn today and your tests are completely normal, you will receive your results only by: MyChart Message (if you have MyChart) OR A paper copy in the mail If you have any lab test that is abnormal or we need to change your treatment, we will call you to review the results.   Testing/Procedures:  None Ordered   Follow-Up: At [facility], you and your health needs are our priority.  As part of our continuing mission to provide you with exceptional heart care, we have created designated Provider Care Teams.  These Care Teams include your primary Cardiologist (physician) and Advanced Practice Providers (APPs -  Physician Assistants and Nurse Practitioners) who all work together to provide you with the care you need, when you need it.  We recommend signing up for the patient portal called "MyChart".  Sign up information is provided on this After Visit Summary.  MyChart is used to connect with patients for Virtual Visits (Telemedicine).  Patients are able to view lab/test results, encounter notes, upcoming appointments, etc.  Non-urgent messages can be sent to your provider as well.   To learn more about what you can do with MyChart, go to [email or web address].    Your next appointment:  AS NEEDED

## 2023-11-21 NOTE — Telephone Encounter (Signed)
 Noted. Pt is aware

## 2023-11-21 NOTE — Progress Notes (Signed)
 Cardiology Office Note:    Date:  11/21/2023   ID:  Jacqueline Delgado, DOB 1951-01-09, MRN [id number]  PCP:  Dineen Rollene MATSU, FNP  Cardiologist:  Redell Cave, MD  Electrophysiologist:  None   Referring MD: Dineen Rollene MATSU, FNP   No chief complaint on file.   History of Present Illness:    Jacqueline Delgado is a 73 y.o. female with a hx of hypertension, obesity, GERD who presents for follow-up.  Previously seen due to chest pain and dizziness.  Coronary CT was obtained to evaluate cardiac etiology.  Presents for testing results.  Has since followed up with gastroenterology due to history of GERD, started on Mylanta with improvement in chest discomfort.  Also takes Mounjaro  which has helped with weight loss.  Overall feels well, no concerns today.  Prior notes Echocardiogram on 12/20/2019 showed normal systolic and diastolic function EF 60 to 65%.     Past Medical History:  Diagnosis Date   Anemia    H/O   Anginal pain (HCC)     3/8-12/21/10   Arthritis    Osteoarthritis in BLE knee   Chronic pain syndrome    Colon polyp    Depression    GERD (gastroesophageal reflux disease)    Hiatal hernia    History of hiatal hernia    History of methicillin resistant staphylococcus aureus (MRSA)    Hypertension    controlled   Obesity    Pre-diabetes    Reflux    Sleep apnea    NO CPAP-SLEEP STUDY FROM 2021 WAS NEGATIVE PER PT   Small vessel disease (HCC)     Past Surgical History:  Procedure Laterality Date   ABDOMINAL HYSTERECTOMY     partial; per patient has left ovary   BREAST SURGERY     CARPAL TUNNEL RELEASE     CHOLECYSTECTOMY     COLONOSCOPY W/ POLYPECTOMY     adenomatous colon polyp   COLONOSCOPY WITH PROPOFOL  N/A 12/10/2017   Procedure: COLONOSCOPY WITH PROPOFOL ;  Surgeon: Viktoria Lamar DASEN, MD;  Location: [facility] ENDOSCOPY;  Service: Endoscopy;  Laterality: N/A;   COLONOSCOPY WITH PROPOFOL  N/A 04/25/2023   Procedure: COLONOSCOPY WITH PROPOFOL ;  Surgeon:  Maryruth Ole DASEN, MD;  Location: [facility] ENDOSCOPY;  Service: Endoscopy;  Laterality: N/A;   ESOPHAGOGASTRODUODENOSCOPY (EGD) WITH PROPOFOL  N/A 10/11/2015   Procedure: ESOPHAGOGASTRODUODENOSCOPY (EGD) WITH PROPOFOL ;  Surgeon: Lamar DASEN Viktoria, MD;  Location: [facility] ENDOSCOPY;  Service: Endoscopy;  Laterality: N/A;   ESOPHAGOGASTRODUODENOSCOPY (EGD) WITH PROPOFOL  N/A 12/10/2017   Procedure: ESOPHAGOGASTRODUODENOSCOPY (EGD) WITH PROPOFOL ;  Surgeon: Viktoria Lamar DASEN, MD;  Location: [facility] ENDOSCOPY;  Service: Endoscopy;  Laterality: N/A;   ESOPHAGOGASTRODUODENOSCOPY (EGD) WITH PROPOFOL  N/A 05/03/2022   Procedure: ESOPHAGOGASTRODUODENOSCOPY (EGD) WITH PROPOFOL ;  Surgeon: Maryruth Ole DASEN, MD;  Location: [facility] ENDOSCOPY;  Service: Endoscopy;  Laterality: N/A;   JOINT REPLACEMENT Right    knee, Oct 2012   JOINT REPLACEMENT     hopefully getting left partial knee replacement in July 2016   JOINT REPLACEMENT Left    03/06/21   POLYPECTOMY  04/25/2023   Procedure: POLYPECTOMY;  Surgeon: Maryruth Ole DASEN, MD;  Location: [facility] ENDOSCOPY;  Service: Endoscopy;;   SAVORY DILATION N/A 10/11/2015   Procedure: HARLEY DILATION;  Surgeon: Lamar DASEN Viktoria, MD;  Location: [facility] ENDOSCOPY;  Service: Endoscopy;  Laterality: N/A;   SHOULDER ARTHROSCOPY WITH ROTATOR CUFF REPAIR AND SUBACROMIAL DECOMPRESSION Right 06/14/2015   Procedure: SHOULDER ARTHROSCOPY WITH mini open ROTATOR CUFF REPAIR AND SUBACROMIAL DECOMPRESSION, release long  head biceps tendon.;  Surgeon: Helayne Glenn, MD;  Location: [facility] ORS;  Service: Orthopedics;  Laterality: Right;   TOTAL KNEE ARTHROPLASTY Left 03/06/2021   Procedure: TOTAL KNEE ARTHROPLASTY - Medford Amber to Assist;  Surgeon: Kathlynn Sharper, MD;  Location: [facility] ORS;  Service: Orthopedics;  Laterality: Left;   TRIGGER FINGER RELEASE       Current Medications: Current Meds  Medication Sig   ACIPHEX  20 MG tablet TAKE 1 TABLET (20 MG TOTAL) BY MOUTH IN THE MORNING AND AT BEDTIME.(DAW  1-BRAND NAME)   famotidine  (PEPCID ) 40 MG tablet Take 40 mg by mouth at bedtime.   fluticasone  (FLONASE ) 50 MCG/ACT nasal spray PLACE 2 SPRAYS INTO BOTH NOSTRILS DAILY AS NEEDED FOR ALLERGIES OR RHINITIS.   losartan  (COZAAR ) 50 MG tablet TAKE 1 TABLET BY MOUTH EVERY DAY   ondansetron  (ZOFRAN -ODT) 4 MG disintegrating tablet TAKE 1 TABLET BY MOUTH EVERY 8 HOURS AS NEEDED FOR NAUSEA AND VOMITING   tirzepatide  (MOUNJARO ) 12.5 MG/0.5ML Pen Inject 12.5 mg into the skin once a week.     Allergies:   Patient has no known allergies.   Social History   Socioeconomic History   Marital status: Widowed    Spouse name: Not on file   Number of children: 3   Years of education: Not on file   Highest education level: Not on file  Occupational History   Occupation: Retired  Tobacco Use   Smoking status: Never   Smokeless tobacco: Never  Vaping Use   Vaping status: Never Used  Substance and Sexual Activity   Alcohol use: No   Drug use: No   Sexual activity: Not on file  Other Topics Concern   Not on file  Social History Narrative   Lost a son on march 8th 20+ years ago.    March through Mother's Day is a very hard time every year.    Works with [employer] until 09/2020   Social Drivers of Health   Financial Resource Strain: Medium Risk (03/04/2023)   Overall Financial Resource Strain (CARDIA)    Difficulty of Paying Living Expenses: Somewhat hard  Food Insecurity: Food Insecurity Present (03/04/2023)   Hunger Vital Sign    Worried About Running Out of Food in the Last Year: Often true    Ran Out of Food in the Last Year: Often true  Transportation Needs: Unmet Transportation Needs (03/04/2023)   PRAPARE - Administrator, Civil Service (Medical): Yes    Lack of Transportation (Non-Medical): Yes  Physical Activity: Insufficiently Active (03/04/2023)   Exercise Vital Sign    Days of Exercise per Week: 3 days    Minutes of Exercise per Session: 10 min  Stress: Stress Concern Present  (03/04/2023)   [employer] of Occupational Health - Occupational Stress Questionnaire    Feeling of Stress : To some extent  Social Connections: Socially Isolated (03/04/2023)   Social Connection and Isolation Panel [NHANES]    Frequency of Communication with Friends and Family: More than three times a week    Frequency of Social Gatherings with Friends and Family: Once a week    Attends Religious Services: Never    Database Administrator or Organizations: No    Attends Banker Meetings: Never    Marital Status: Widowed     Family History: The patient's family history includes Alcoholism in an other family member; Aneurysm (age of onset: 77) in her sister; Arthritis in an other family member; Breast cancer in her  maternal grandmother; Breast cancer (age of onset: 75) in her mother; Colon cancer (age of onset: 45) in her sister; Diabetes in an other family member; Heart disease in her father; Hyperlipidemia in her father; Hypertension in her father; Stroke in her father. There is no history of Thyroid  cancer.  ROS:   Please see the history of present illness.     All other systems reviewed and are negative.  EKGs/Labs/Other Studies Reviewed:    The following studies were reviewed today:       Recent Labs: 03/26/2023: ALT 14; TSH 1.96 04/08/2023: Hemoglobin 12.1; Platelets 233 04/09/2023: Magnesium  2.1 11/20/2023: BUN 15; Creatinine, Ser 0.96; Potassium 3.6; Sodium 143  Recent Lipid Panel    Component Value Date/Time   CHOL 142 03/25/2023 0812   CHOL 134 09/13/2020 0912   CHOL 135 06/10/2014 0411   TRIG 91.0 03/25/2023 0812   TRIG 63 06/10/2014 0411   HDL 43.00 03/25/2023 0812   HDL 38 (L) 09/13/2020 0912   HDL 36 (L) 06/10/2014 0411   CHOLHDL 3 03/25/2023 0812   VLDL 18.2 03/25/2023 0812   VLDL 13 06/10/2014 0411   LDLCALC 81 03/25/2023 0812   LDLCALC 83 09/13/2020 0912   LDLCALC 86 06/10/2014 0411    Physical Exam:    VS:  BP 126/86   Pulse 80   Ht  5' 3 (1.6 m)   Wt 204 lb (92.5 kg)   SpO2 98%   BMI 36.14 kg/m     Wt Readings from Last 3 Encounters:  11/21/23 204 lb (92.5 kg)  07/28/23 225 lb 3.2 oz (102.2 kg)  07/18/23 226 lb (102.5 kg)     GEN:  Well nourished, well developed in no acute distress HEENT: Normal NECK: No JVD; No carotid bruits CARDIAC: RRR, no murmurs, rubs, gallops RESPIRATORY:  Clear to auscultation without rales, wheezing or rhonchi  ABDOMEN: Soft, non-tender, non-distended MUSCULOSKELETAL: 2+ edema at ankles; No deformity  SKIN: Warm and dry NEUROLOGIC:  Alert and oriented x 3 PSYCHIATRIC:  Normal affect   ASSESSMENT:    1. Precordial pain   2. Primary hypertension   3. BMI 36.0-36.9,adult    PLAN:    In order of problems listed above:  Chest pain.  Coronary CT 10/24 no CAD.  Prior echo with normal EF.  Etiology likely GI especially with resolution after taking reflux medications. Hypertension, BP controlled.  Continue losartan  50 mg daily.  Obesity, lost over 20 pounds since last visit.  Congratulated on weight loss.  Continue low-calorie diet,  On Mounjaro .  Follow-up as needed.   Medication Adjustments/Labs and Tests Ordered: Current medicines are reviewed at length with the patient today.  Concerns regarding medicines are outlined above.  No orders of the defined types were placed in this encounter.  No orders of the defined types were placed in this encounter.   Patient Instructions  Medication Instructions:   Your physician recommends that you continue on your current medications as directed. Please refer to the Current Medication list given to you today.  *If you need a refill on your cardiac medications before your next appointment, please call your pharmacy*   Lab Work:  None Ordered  If you have labs (blood work) drawn today and your tests are completely normal, you will receive your results only by: MyChart Message (if you have MyChart) OR A paper copy in the mail If  you have any lab test that is abnormal or we need to change your treatment, we will call  you to review the results.   Testing/Procedures:  None Ordered    Follow-Up: At [facility], you and your health needs are our priority.  As part of our continuing mission to provide you with exceptional heart care, we have created designated Provider Care Teams.  These Care Teams include your primary Cardiologist (physician) and Advanced Practice Providers (APPs -  Physician Assistants and Nurse Practitioners) who all work together to provide you with the care you need, when you need it.  We recommend signing up for the patient portal called MyChart.  Sign up information is provided on this After Visit Summary.  MyChart is used to connect with patients for Virtual Visits (Telemedicine).  Patients are able to view lab/test results, encounter notes, upcoming appointments, etc.  Non-urgent messages can be sent to your provider as well.   To learn more about what you can do with MyChart, go to [email or web address].    Your next appointment:    AS NEEDED         Signed, Redell Cave, MD  11/21/2023 3:50 PM    [facility]

## 2023-11-23 LAB — URINE CULTURE
MICRO NUMBER:: 16050852
SPECIMEN QUALITY:: ADEQUATE

## 2023-11-24 NOTE — Telephone Encounter (Signed)
 Patient called back and said that the fax number is [phone] for the [facility] lab. She asked if it can please be sent so she can get labs done next week

## 2023-11-25 ENCOUNTER — Other Ambulatory Visit: Payer: Self-pay

## 2023-11-25 ENCOUNTER — Encounter: Payer: Self-pay | Admitting: Family

## 2023-11-25 ENCOUNTER — Other Ambulatory Visit: Payer: Self-pay | Admitting: Family

## 2023-11-25 DIAGNOSIS — N3000 Acute cystitis without hematuria: Secondary | ICD-10-CM

## 2023-11-25 MED ORDER — AMOXICILLIN-POT CLAVULANATE 875-125 MG PO TABS
1.0000 | ORAL_TABLET | Freq: Two times a day (BID) | ORAL | 0 refills | Status: AC
Start: 2023-11-25 — End: 2023-12-02

## 2023-11-25 NOTE — Addendum Note (Signed)
Addended by: [place], Findley Vi on: 11/25/2023 11:34 AM   Modules accepted: Orders

## 2023-11-26 ENCOUNTER — Other Ambulatory Visit: Payer: 59

## 2023-12-01 ENCOUNTER — Other Ambulatory Visit: Payer: 59

## 2023-12-01 ENCOUNTER — Other Ambulatory Visit: Payer: Self-pay

## 2023-12-01 DIAGNOSIS — E041 Nontoxic single thyroid nodule: Secondary | ICD-10-CM

## 2023-12-05 ENCOUNTER — Other Ambulatory Visit: Payer: 59

## 2023-12-08 ENCOUNTER — Encounter: Payer: Self-pay | Admitting: Family

## 2023-12-09 ENCOUNTER — Telehealth: Payer: Self-pay | Admitting: Family

## 2023-12-09 ENCOUNTER — Ambulatory Visit: Payer: 59 | Admitting: "Endocrinology

## 2023-12-09 NOTE — Telephone Encounter (Signed)
 Received a call from [facility] [place] hygienist in regards to extraction of tooth #12. Question was if patient should take prophylactic penicillin prior to procedure.  She has a history of right total knee replacement.  No history of CKD.  Advised prophylactic penicillin is appropriate   Ongoing, patient will need to speak with her orthopedic if she will require prophylactic penicillin prior to dental procedure, routine cleanings.

## 2023-12-10 ENCOUNTER — Other Ambulatory Visit: Payer: Self-pay

## 2023-12-31 ENCOUNTER — Other Ambulatory Visit

## 2023-12-31 DIAGNOSIS — R899 Unspecified abnormal finding in specimens from other organs, systems and tissues: Secondary | ICD-10-CM

## 2023-12-31 NOTE — Addendum Note (Signed)
 Addended by: Jarvis Morgan D on: 12/31/2023 02:44 PM   Modules accepted: Orders

## 2024-01-01 ENCOUNTER — Ambulatory Visit (INDEPENDENT_AMBULATORY_CARE_PROVIDER_SITE_OTHER): Admitting: Family

## 2024-01-01 ENCOUNTER — Encounter: Payer: Self-pay | Admitting: Family

## 2024-01-01 VITALS — BP 128/82 | HR 76 | Temp 97.8°F | Ht 65.0 in | Wt 195.8 lb

## 2024-01-01 DIAGNOSIS — Z23 Encounter for immunization: Secondary | ICD-10-CM

## 2024-01-01 DIAGNOSIS — Z1231 Encounter for screening mammogram for malignant neoplasm of breast: Secondary | ICD-10-CM

## 2024-01-01 DIAGNOSIS — I10 Essential (primary) hypertension: Secondary | ICD-10-CM

## 2024-01-01 DIAGNOSIS — Z0000 Encounter for general adult medical examination without abnormal findings: Secondary | ICD-10-CM | POA: Diagnosis not present

## 2024-01-01 DIAGNOSIS — Z96652 Presence of left artificial knee joint: Secondary | ICD-10-CM | POA: Diagnosis not present

## 2024-01-01 LAB — MICROALBUMIN / CREATININE URINE RATIO
Creatinine, Urine: 211.2 mg/dL
Microalb/Creat Ratio: 7 mg/g{creat} (ref 0–29)
Microalbumin, Urine: 14 ug/mL

## 2024-01-01 MED ORDER — LOSARTAN POTASSIUM 50 MG PO TABS
50.0000 mg | ORAL_TABLET | Freq: Every day | ORAL | 3 refills | Status: DC
Start: 2024-01-01 — End: 2024-04-18

## 2024-01-01 MED ORDER — AMOXICILLIN 500 MG PO CAPS: ORAL_CAPSULE | ORAL | 2 refills | Status: DC

## 2024-01-01 NOTE — Patient Instructions (Signed)
 Call Dr Mart Piggs office and let me know when you are to return for colonoscopy.

## 2024-01-01 NOTE — Assessment & Plan Note (Signed)
 Clinical breast exam performed today.  Deferred pelvic exam in the absence of complaints and patient  preference. She states she does not have a cervix.  She is no longer screening for cervical cancer.  No history of gynecologic cancer.encouraged exercise. PCV20 given.

## 2024-01-01 NOTE — Assessment & Plan Note (Signed)
Chronic, stable. Continue losartan 50mg daily.  

## 2024-01-01 NOTE — Assessment & Plan Note (Signed)
 Congratulated patient on incredible weight loss journey ,77 lbs. Continue Mounjaro 12.5 mg for maintenance.

## 2024-01-01 NOTE — Progress Notes (Signed)
 Assessment & Plan:  Encounter for screening mammogram for malignant neoplasm of breast -     3D Screening Mammogram, Left and Right; Future  Essential hypertension Assessment & Plan: Chronic, stable.  Continue losartan 50 mg daily  Orders: -     Losartan Potassium; Take 1 tablet (50 mg total) by mouth daily.  Dispense: 90 tablet; Refill: 3  S/P TKR (total knee replacement) using cement, left -     Amoxicillin; Take 2 g PO ( 4 tablets)  30 to 60 minutes before procedure  Dispense: 30 capsule; Refill: 2  Need for pneumococcal 20-valent conjugate vaccination -     Pneumococcal conjugate vaccine 20-valent  Morbid obesity (HCC) Assessment & Plan: Congratulated patient on incredible weight loss journey ,77 lbs. Continue Mounjaro 12.5 mg for maintenance.   Annual physical exam Assessment & Plan: Clinical breast exam performed today.  Deferred pelvic exam in the absence of complaints and patient  preference. She states she does not have a cervix.  She is no longer screening for cervical cancer.  No history of gynecologic cancer.encouraged exercise. PCV20 given.       Return precautions given.   Risks, benefits, and alternatives of the medications and treatment plan prescribed today were discussed, and patient expressed understanding.   Education regarding symptom management and diagnosis given to patient on AVS either electronically or printed.  No follow-ups on file.  Rennie Plowman, FNP  Subjective:    Patient ID: Jacqueline Delgado, female    DOB: Nov 12, 1950, 73 y.o.   MRN: [id number]  CC: Jacqueline Delgado is a 73 y.o. female who presents today for physical exam and medication follow up.    HPI: Weight in 2022 272lbs. She is very pleased with zepbound. She is tolerating zepbound. No constipation.    H/o BL TKR, Requests refill of amoxicillin prior to dentist appointment next week  No further episodes of dizziness.  She feels well losartan 50 mg daily.  History of thyroid  nodule.  She has appointment with  endocrinology, Dr Roosevelt Locks next month,   Colorectal Cancer Screening: UTD , Dr Mia Creek 04/25/23; patient is unsure to return and states she will confirm by calling his office.  Breast Cancer Screening: Mammogram due Cervical Cancer Screening: No pelvic pain. No h/o GYN cancer.  History of hysterectomy.  Unable to find prior Pap smear in epic Bone Health screening/DEXA for 65+: due 07/2024  Lung Cancer Screening: Doesn't have 20 year pack year history and age > 51 years yo 80 year       Tetanus - UTD        Pneumococcal - Candidate for PCV20  Exercise: Plans to start walking on treadmill. Alcohol use:  none Smoking/tobacco use: Nonsmoker.    Health Maintenance  Topic Date Due   Flu Shot  01/12/2024*   Zoster (Shingles) Vaccine (1 of 2) 04/02/2024*   Medicare Annual Wellness Visit  03/03/2024   Mammogram  07/22/2024   Colon Cancer Screening  04/24/2026   DTaP/Tdap/Td vaccine (2 - Td or Tdap) 11/03/2031   DEXA scan (bone density measurement)  Completed   Hepatitis C Screening  Completed   HPV Vaccine  Aged Out   Pneumonia Vaccine  Discontinued   COVID-19 Vaccine  Discontinued  *Topic was postponed. The date shown is not the original due date.    ALLERGIES: Patient has no known allergies.  Current Outpatient Medications on File Prior to Visit  Medication Sig Dispense Refill   ACIPHEX 20 MG tablet TAKE  1 TABLET (20 MG TOTAL) BY MOUTH IN THE MORNING AND AT BEDTIME.(DAW 1-BRAND NAME) 180 tablet 3   famotidine (PEPCID) 40 MG tablet Take 40 mg by mouth at bedtime.     fluticasone (FLONASE) 50 MCG/ACT nasal spray PLACE 2 SPRAYS INTO BOTH NOSTRILS DAILY AS NEEDED FOR ALLERGIES OR RHINITIS. 48 mL 1   ondansetron (ZOFRAN-ODT) 4 MG disintegrating tablet TAKE 1 TABLET BY MOUTH EVERY 8 HOURS AS NEEDED FOR NAUSEA AND VOMITING 30 tablet 1   tirzepatide (MOUNJARO) 12.5 MG/0.5ML Pen Inject 12.5 mg into the skin once a week. 6 mL 2   No current  facility-administered medications on file prior to visit.    Review of Systems  Constitutional:  Negative for chills and fever.  Respiratory:  Negative for cough.   Cardiovascular:  Negative for chest pain and palpitations.  Gastrointestinal:  Negative for nausea and vomiting.      Objective:    BP 128/82   Pulse 76   Temp 97.8 F (36.6 C) (Oral)   Ht 5\' 5"  (1.651 m)   Wt 195 lb 12.8 oz (88.8 kg)   SpO2 97%   BMI 32.58 kg/m   BP Readings from Last 3 Encounters:  01/01/24 128/82  11/21/23 126/86  07/31/23 (!) 147/89   Wt Readings from Last 3 Encounters:  01/01/24 195 lb 12.8 oz (88.8 kg)  11/21/23 204 lb (92.5 kg)  07/28/23 225 lb 3.2 oz (102.2 kg)    Physical Exam Vitals reviewed.  Constitutional:      Appearance: Normal appearance. She is well-developed.  Eyes:     Conjunctiva/sclera: Conjunctivae normal.  Neck:     Thyroid: No thyroid mass or thyromegaly.  Cardiovascular:     Rate and Rhythm: Normal rate and regular rhythm.     Pulses: Normal pulses.     Heart sounds: Normal heart sounds.  Pulmonary:     Effort: Pulmonary effort is normal.     Breath sounds: Normal breath sounds. No wheezing, rhonchi or rales.  Chest:  Breasts:    Breasts are symmetrical.     Right: No inverted nipple, mass, nipple discharge, skin change or tenderness.     Left: No inverted nipple, mass, nipple discharge, skin change or tenderness.  Abdominal:     General: Bowel sounds are normal. There is no distension.     Palpations: Abdomen is soft. Abdomen is not rigid. There is no fluid wave or mass.     Tenderness: There is no abdominal tenderness. There is no guarding or rebound.  Lymphadenopathy:     Head:     Right side of head: No submental, submandibular, tonsillar, preauricular, posterior auricular or occipital adenopathy.     Left side of head: No submental, submandibular, tonsillar, preauricular, posterior auricular or occipital adenopathy.     Cervical: No cervical  adenopathy.     Right cervical: No superficial, deep or posterior cervical adenopathy.    Left cervical: No superficial, deep or posterior cervical adenopathy.  Skin:    General: Skin is warm and dry.  Neurological:     Mental Status: She is alert.  Psychiatric:        Speech: Speech normal.        Behavior: Behavior normal.        Thought Content: Thought content normal.

## 2024-01-06 ENCOUNTER — Other Ambulatory Visit: Payer: Self-pay

## 2024-01-06 ENCOUNTER — Telehealth: Payer: Self-pay

## 2024-01-06 DIAGNOSIS — R899 Unspecified abnormal finding in specimens from other organs, systems and tissues: Secondary | ICD-10-CM

## 2024-01-06 NOTE — Telephone Encounter (Signed)
 Spoke to pt lab appt is scheduled

## 2024-01-06 NOTE — Telephone Encounter (Signed)
 Copied from CRM [id number]. Topic: Clinical - Request for Lab/Test Order >> Jan 06, 2024 12:30 PM Jacqueline Delgado wrote: Reason for CRM: This patient is asking if Jacqueline Delgado can give her a call. She explained that she is supposed to get labs done for her Endo doctor's appointment on 01/14/24, but this is not where the labs should be done. I asked her where and she does not know but continued to tell me that the lab reqs was sent to Jacqueline Delgado and needs to be done in [place]. Please follow up and assist this patient.

## 2024-01-07 ENCOUNTER — Other Ambulatory Visit (INDEPENDENT_AMBULATORY_CARE_PROVIDER_SITE_OTHER)

## 2024-01-07 DIAGNOSIS — R899 Unspecified abnormal finding in specimens from other organs, systems and tissues: Secondary | ICD-10-CM | POA: Diagnosis not present

## 2024-01-09 ENCOUNTER — Encounter: Payer: Self-pay | Admitting: Family

## 2024-01-09 LAB — T4, FREE: Free T4: 0.85 ng/dL (ref 0.60–1.60)

## 2024-01-09 LAB — T3, FREE: T3, Free: 3.1 pg/mL (ref 2.3–4.2)

## 2024-01-09 LAB — TSH: TSH: 1.83 u[IU]/mL (ref 0.35–5.50)

## 2024-01-14 ENCOUNTER — Encounter: Payer: Self-pay | Admitting: "Endocrinology

## 2024-01-14 ENCOUNTER — Ambulatory Visit (INDEPENDENT_AMBULATORY_CARE_PROVIDER_SITE_OTHER): Payer: 59 | Admitting: "Endocrinology

## 2024-01-14 VITALS — BP 100/80 | HR 9 | Ht 64.0 in | Wt 193.0 lb

## 2024-01-14 DIAGNOSIS — E042 Nontoxic multinodular goiter: Secondary | ICD-10-CM | POA: Diagnosis not present

## 2024-01-14 NOTE — Progress Notes (Signed)
 Outpatient Endocrinology Note Jacqueline Frazee, MD  01/14/24   Jacqueline Delgado 10-30-1950 [id number]  Referring Provider: Allegra Grana, FNP Primary Care Provider: Allegra Grana, FNP Subjective  No chief complaint on file.   Assessment & Plan  Diagnoses and all orders for this visit:  Multiple thyroid nodules   Jacqueline Delgado is currently taking no thyroid medication.  No indication for medication. Patient is currently biochemically euthyroid.  Educated on thyroid axis.  Repeat lab before next visit or sooner if symptoms of hyperthyroidism or hypothyroidism develop.  Notify us immediately in case of significant weight gain or loss. Counseled on compliance and follow up needs.  07/2023 thyroid ultrasound images and report reviewed: reported multiple thyroid nodules, 1 of which was TR 4 at 1.2 cm, meeting the criteria for 1 year follow-up. Recommend follow-up thyroid ultrasound in 07/2024  I have reviewed current medications, nurse's notes, allergies, vital signs, past medical and surgical history, family medical history, and social history for this encounter. Counseled patient on symptoms, examination findings, lab findings, imaging results, treatment decisions and monitoring and prognosis. The patient understood the recommendations and agrees with the treatment plan. All questions regarding treatment plan were fully answered.   Return in about 6 months (around 07/15/2024).   Jacqueline [place], MD  01/14/24   I have reviewed current medications, nurse's notes, allergies, vital signs, past medical and surgical history, family medical history, and social history for this encounter. Counseled patient on symptoms, examination findings, lab findings, imaging results, treatment decisions and monitoring and prognosis. The patient understood the recommendations and agrees with the treatment plan. All questions regarding treatment plan were fully answered.   History of Present  Illness Jacqueline Delgado is a 73 y.o. year old female who presents to our clinic with thyroid nodule diagnosed in 2024.    Symptoms suggestive of HYPOTHYROIDISM:  fatigue Yes weight gain No cold intolerance  Yes constipation  No  Symptoms suggestive of HYPERTHYROIDISM:  weight loss  Yes, with mounjaro  heat intolerance No hyperdefecation  No palpitations  No  Compressive symptoms:  dysphagia  Yes dysphonia  Yes positional dyspnea (especially with simultaneous arms elevation)  No  Smokes  No On biotin  No Personal history of head/neck surgery/irradiation  No  07/2023 CLINICAL DATA:  Incidental on [place]. right-sided thyroid nodule incidentally visualized seen US carotid 06/30/23   EXAM: THYROID ULTRASOUND   TECHNIQUE: Ultrasound examination of the thyroid gland and adjacent soft tissues was performed.   COMPARISON:  None Available.   FINDINGS: Parenchymal Echotexture: Normal   Isthmus: 0.4 cm   Right lobe: 5.1 x 1.6 x 2.1 cm   Left lobe: 4.5 x 1.6 x 2.1 cm   _________________________________________________________   Estimated total number of nodules >/= 1 cm: 1   Number of spongiform nodules >/=  2 cm not described below (TR1): 0   Number of mixed cystic and solid nodules >/= 1.5 cm not described below (TR2): 0   _________________________________________________________   Nodule labeled 1 is a solid isoechoic nodule with peripheral calcification (TR 4) in the superior right thyroid lobe measuring 0.7 cm. Given size (<0.9 cm) and appearance, this nodule does NOT meet TI-RADS criteria for biopsy or dedicated follow-up.   Nodule labeled 2 is a solid hypoechoic nodule with irregular borders (TR 4) in the mid left thyroid lobe measuring 1.2 cm. *Given size (>/= 1 - 1.4 cm) and appearance, a follow-up ultrasound in 1 year should be considered based on TI-RADS criteria.  Nodule labeled 3 is a solid hypoechoic TR 4 nodule in the mid left thyroid lobe measuring 0.7  cm. Given size (<0.9 cm) and appearance, this nodule does NOT meet TI-RADS criteria for biopsy or dedicated follow-up.   IMPRESSION: 1. Multinodular thyroid gland. 2. Nodule labeled 2 in the mid left thyroid lobe (1.2 cm TR 4) meets criteria for follow-up ultrasound in 1 year. A total follow-up interval of 5 years is recommended, with this exam serving as a baseline. 3. Additional small subcentimeter nodules in the thyroid gland, including previously visualized nodule in the right thyroid lobe as queried in the provided history, do not meet criteria for further dedicated follow-up or biopsy.    Physical Exam  BP 100/80   Pulse (!) 9   Ht 5\' 4"  (1.626 m)   Wt 193 lb (87.5 kg)   SpO2 98%   BMI 33.13 kg/m  Constitutional: well developed, well nourished Head: normocephalic, atraumatic, no exophthalmos Eyes: sclera anicteric, no redness Neck: no thyromegaly, no thyroid tenderness; no nodules palpated Lungs: normal respiratory effort Neurology: alert and oriented, no fine hand tremor Skin: dry, no appreciable rashes Musculoskeletal: no appreciable defects Psychiatric: normal mood and affect  Allergies No Known Allergies  Current Medications Patient's Medications  New Prescriptions   No medications on file  Previous Medications   ACIPHEX 20 MG TABLET    TAKE 1 TABLET (20 MG TOTAL) BY MOUTH IN THE MORNING AND AT BEDTIME.(DAW 1-BRAND NAME)   AMOXICILLIN (AMOXIL) 500 MG CAPSULE    Take 2 g PO ( 4 tablets)  30 to 60 minutes before procedure   FAMOTIDINE (PEPCID) 40 MG TABLET    Take 40 mg by mouth at bedtime.   FLUTICASONE (FLONASE) 50 MCG/ACT NASAL SPRAY    PLACE 2 SPRAYS INTO BOTH NOSTRILS DAILY AS NEEDED FOR ALLERGIES OR RHINITIS.   LOSARTAN (COZAAR) 50 MG TABLET    Take 1 tablet (50 mg total) by mouth daily.   ONDANSETRON (ZOFRAN-ODT) 4 MG DISINTEGRATING TABLET    TAKE 1 TABLET BY MOUTH EVERY 8 HOURS AS NEEDED FOR NAUSEA AND VOMITING   TIRZEPATIDE (MOUNJARO) 12.5 MG/0.5ML  PEN    Inject 12.5 mg into the skin once a week.  Modified Medications   No medications on file  Discontinued Medications   No medications on file    Past Medical History Past Medical History:  Diagnosis Date   Anemia    H/O   Anginal pain (HCC)     3/8-12/21/10   Arthritis    Osteoarthritis in BLE knee   Chronic pain syndrome    Colon polyp    Depression    GERD (gastroesophageal reflux disease)    Hiatal hernia    History of hiatal hernia    History of methicillin resistant staphylococcus aureus (MRSA)    Hypertension    controlled   Obesity    Pre-diabetes    Reflux    Sleep apnea    NO CPAP-SLEEP STUDY FROM 2021 WAS NEGATIVE PER PT   Small vessel disease (HCC)     Past Surgical History Past Surgical History:  Procedure Laterality Date   ABDOMINAL HYSTERECTOMY     partial; per patient has left ovary. she states NO CERVIX. No h/o GYN cancer.   BREAST SURGERY     CARPAL TUNNEL RELEASE     CHOLECYSTECTOMY     COLONOSCOPY W/ POLYPECTOMY     adenomatous colon polyp   COLONOSCOPY WITH PROPOFOL N/A 12/10/2017   Procedure: COLONOSCOPY WITH  PROPOFOL;  Surgeon: Scot Jun, MD;  Location: [facility] ENDOSCOPY;  Service: Endoscopy;  Laterality: N/A;   COLONOSCOPY WITH PROPOFOL N/A 04/25/2023   Procedure: COLONOSCOPY WITH PROPOFOL;  Surgeon: Regis Bill, MD;  Location: [facility] ENDOSCOPY;  Service: Endoscopy;  Laterality: N/A;   ESOPHAGOGASTRODUODENOSCOPY (EGD) WITH PROPOFOL N/A 10/11/2015   Procedure: ESOPHAGOGASTRODUODENOSCOPY (EGD) WITH PROPOFOL;  Surgeon: Scot Jun, MD;  Location: [facility] ENDOSCOPY;  Service: Endoscopy;  Laterality: N/A;   ESOPHAGOGASTRODUODENOSCOPY (EGD) WITH PROPOFOL N/A 12/10/2017   Procedure: ESOPHAGOGASTRODUODENOSCOPY (EGD) WITH PROPOFOL;  Surgeon: Scot Jun, MD;  Location: [facility] ENDOSCOPY;  Service: Endoscopy;  Laterality: N/A;   ESOPHAGOGASTRODUODENOSCOPY (EGD) WITH PROPOFOL N/A 05/03/2022   Procedure: ESOPHAGOGASTRODUODENOSCOPY  (EGD) WITH PROPOFOL;  Surgeon: Regis Bill, MD;  Location: [facility] ENDOSCOPY;  Service: Endoscopy;  Laterality: N/A;   JOINT REPLACEMENT Right    knee, Oct 2012   JOINT REPLACEMENT     hopefully getting left partial knee replacement in July 2016   JOINT REPLACEMENT Left    03/06/21   POLYPECTOMY  04/25/2023   Procedure: POLYPECTOMY;  Surgeon: Regis Bill, MD;  Location: [facility] ENDOSCOPY;  Service: Endoscopy;;   SAVORY DILATION N/A 10/11/2015   Procedure: Gaspar Bidding DILATION;  Surgeon: Scot Jun, MD;  Location: [facility] ENDOSCOPY;  Service: Endoscopy;  Laterality: N/A;   SHOULDER ARTHROSCOPY WITH ROTATOR CUFF REPAIR AND SUBACROMIAL DECOMPRESSION Right 06/14/2015   Procedure: SHOULDER ARTHROSCOPY WITH mini open ROTATOR CUFF REPAIR AND SUBACROMIAL DECOMPRESSION, release long head biceps tendon.;  Surgeon: Erin Sons, MD;  Location: [facility] ORS;  Service: Orthopedics;  Laterality: Right;   TOTAL KNEE ARTHROPLASTY Left 03/06/2021   Procedure: TOTAL KNEE ARTHROPLASTY - [place] Neighbor to Assist;  Surgeon: Kennedy Bucker, MD;  Location: [facility] ORS;  Service: Orthopedics;  Laterality: Left;   TRIGGER FINGER RELEASE      Family History family history includes Alcoholism in an other family member; Aneurysm (age of onset: 68) in her sister; Arthritis in an other family member; Breast cancer in her maternal grandmother; Breast cancer (age of onset: 79) in her mother; Colon cancer (age of onset: 34) in her sister; Diabetes in an other family member; Heart disease in her father; Hyperlipidemia in her father; Hypertension in her father; Stroke in her father.  Social History Social History   Socioeconomic History   Marital status: Widowed    Spouse name: Not on file   Number of children: 3   Years of education: Not on file   Highest education level: Not on file  Occupational History   Occupation: Retired  Tobacco Use   Smoking status: Never   Smokeless tobacco: Never  Vaping Use   Vaping  status: Never Used  Substance and Sexual Activity   Alcohol use: No   Drug use: No   Sexual activity: Not on file  Other Topics Concern   Not on file  Social History Narrative   Lost a son on march 8th 20+ years ago.    March through Mother's Day is a very hard time every year.    Works with [employer] until 09/2020   Social Drivers of Health   Financial Resource Strain: Medium Risk (03/04/2023)   Overall Financial Resource Strain (CARDIA)    Difficulty of Paying Living Expenses: Somewhat hard  Food Insecurity: Food Insecurity Present (03/04/2023)   Hunger Vital Sign    Worried About Running Out of Food in the Last Year: Often true    Ran Out of Food in the Last Year: Often  true  Transportation Needs: Unmet Transportation Needs (03/04/2023)   PRAPARE - Transportation    Lack of Transportation (Medical): Yes    Lack of Transportation (Non-Medical): Yes  Physical Activity: Insufficiently Active (03/04/2023)   Exercise Vital Sign    Days of Exercise per Week: 3 days    Minutes of Exercise per Session: 10 min  Stress: Stress Concern Present (03/04/2023)   [employer] of Occupational Health - Occupational Stress Questionnaire    Feeling of Stress : To some extent  Social Connections: Socially Isolated (03/04/2023)   Social Connection and Isolation Panel [NHANES]    Frequency of Communication with Friends and Family: More than three times a week    Frequency of Social Gatherings with Friends and Family: Once a week    Attends Religious Services: Never    Database administrator or Organizations: No    Attends Banker Meetings: Never    Marital Status: Widowed  Intimate Partner Violence: Not At Risk (03/04/2023)   Humiliation, Afraid, Rape, and Kick questionnaire    Fear of Current or Ex-Partner: No    Emotionally Abused: No    Physically Abused: No    Sexually Abused: No    Laboratory Investigations Lab Results  Component Value Date   TSH 1.83 01/07/2024   TSH  1.96 03/26/2023   TSH 2.36 07/10/2022   FREET4 0.85 01/07/2024     No results found for: "TSI"   No components found for: "TRAB"   Lab Results  Component Value Date   CHOL 142 03/25/2023   Lab Results  Component Value Date   HDL 43.00 03/25/2023   Lab Results  Component Value Date   LDLCALC 81 03/25/2023   Lab Results  Component Value Date   TRIG 91.0 03/25/2023   Lab Results  Component Value Date   CHOLHDL 3 03/25/2023   Lab Results  Component Value Date   CREATININE 0.96 11/20/2023   Lab Results  Component Value Date   GFR 59.41 (L) 11/20/2023      Component Value Date/Time   NA 143 11/20/2023 1524   NA 145 (H) 07/28/2023 1540   NA 147 (H) 06/10/2014 0411   K 3.6 11/20/2023 1524   K 4.0 06/10/2014 0411   CL 109 11/20/2023 1524   CL 114 (H) 06/10/2014 0411   CO2 21 11/20/2023 1524   CO2 28 06/10/2014 0411   GLUCOSE 68 (L) 11/20/2023 1524   GLUCOSE 95 06/10/2014 0411   BUN 15 11/20/2023 1524   BUN 12 07/28/2023 1540   BUN 20 (H) 06/10/2014 0411   CREATININE 0.96 11/20/2023 1524   CREATININE 0.92 06/10/2014 0411   CALCIUM 9.2 11/20/2023 1524   CALCIUM 8.4 (L) 06/10/2014 0411   PROT 7.6 03/26/2023 0904   PROT 6.4 09/13/2020 0912   PROT 5.9 (L) 06/10/2014 0411   ALBUMIN 4.2 03/26/2023 0904   ALBUMIN 3.8 09/13/2020 0912   ALBUMIN 2.6 (L) 06/10/2014 0411   AST 18 03/26/2023 0904   AST 15 06/10/2014 0411   ALT 14 03/26/2023 0904   ALT 9 (L) 06/10/2014 0411   ALKPHOS 86 03/26/2023 0904   ALKPHOS 79 06/10/2014 0411   BILITOT 0.5 03/26/2023 0904   BILITOT 0.3 09/13/2020 0912   BILITOT 0.2 06/10/2014 0411   GFRNONAA 57 (L) 04/09/2023 1020   GFRNONAA >60 06/10/2014 0411   GFRAA 81 09/13/2020 0912   GFRAA >60 06/10/2014 0411      Latest Ref Rng & Units 11/20/2023  3:24 PM 07/28/2023    3:40 PM 07/25/2023    2:15 PM  BMP  Glucose 70 - 99 mg/dL 68  82  89   BUN 6 - 23 mg/dL 15  12  13    Creatinine 0.40 - 1.20 mg/dL 1.61  0.96  0.45   BUN/Creat  Ratio 12 - 28  12  13    Sodium 135 - 145 mEq/L 143  145  143   Potassium 3.5 - 5.1 mEq/L 3.6  3.5  3.7   Chloride 96 - 112 mEq/L 109  108  106   CO2 19 - 32 mEq/L 21  24  21    Calcium 8.4 - 10.5 mg/dL 9.2  9.8  9.9        Component Value Date/Time   WBC 6.1 04/08/2023 2340   RBC 4.56 04/08/2023 2340   HGB 12.1 04/08/2023 2340   HGB 11.1 (L) 06/10/2014 0411   HCT 38.0 04/08/2023 2340   HCT 33.5 (L) 06/10/2014 0411   PLT 233 04/08/2023 2340   PLT 219 06/10/2014 0411   MCV 83.3 04/08/2023 2340   MCV 86 06/10/2014 0411   MCH 26.5 04/08/2023 2340   MCHC 31.8 04/08/2023 2340   RDW 14.5 04/08/2023 2340   RDW 14.9 (H) 06/10/2014 0411   LYMPHSABS 1.7 03/26/2023 0904   LYMPHSABS 1.7 06/10/2014 0411   MONOABS 0.4 03/26/2023 0904   MONOABS 0.4 06/10/2014 0411   EOSABS 0.2 03/26/2023 0904   EOSABS 0.1 06/10/2014 0411   BASOSABS 0.0 03/26/2023 0904   BASOSABS 0.0 06/10/2014 0411      Parts of this note may have been dictated using voice recognition software. There may be variances in spelling and vocabulary which are unintentional. Not all errors are proofread. Please notify the Thereasa Parkin if any discrepancies are noted or if the meaning of any statement is not clear.

## 2024-01-26 ENCOUNTER — Ambulatory Visit: Payer: Self-pay

## 2024-01-26 NOTE — Telephone Encounter (Signed)
  Chief Complaint: area of growth or nodule to the front right side of neck Symptoms: rounded raised area to throat Frequency: patient unsure how long its been there Pertinent Negatives: Patient denies Sob, CP Disposition: [] ED /[] Urgent Care (no appt availability in office) / [x] Appointment(In office/virtual)/ []  [facility]/ [] Home Care/ [] Refused Recommended Disposition /[] [facility]/ []  Follow-up with PCP Additional Notes: patient called with concerns for area of growth or nodule to the front of throat on the right side. Patient describes a rounded raised area to the right side of throat. Patient is unsure of how long the area has been there but is concerned. Per protocol, patient is recommended to be seen within 24 hours. Appointment made with another provider in PCP office tomorrow at 9:00 AM. Patient verbalized understanding of plan and all questions answered.    Copied from CRM [id number]. Topic: Clinical - Red Word Triage >> Jan 26, 2024  4:25 PM Ivette P wrote: [facility] that prompted transfer to Nurse Triage: knot on front side of neck Reason for Disposition  Tenderness or swelling of front of neck over windpipe  Answer Assessment - Initial Assessment Questions 1. ONSET: "When did the pain begin?"      Unsure of when it started 2. LOCATION: "Where does it hurt?"      Right side of neck 3. PATTERN "Does the pain come and go, or has it been constant since it started?"      constant 4. SEVERITY: "How bad is the pain?"  (Scale 1-10; or mild, moderate, severe)   - NO PAIN (0): no pain or only slight stiffness    - MILD (1-3): doesn't interfere with normal activities    - MODERATE (4-7): interferes with normal activities or awakens from sleep    - SEVERE (8-10):  excruciating pain, unable to do any normal activities      Mild pain 5. RADIATION: "Does the pain go anywhere else, shoot into your arms?"     no 7. CAUSE: "What do you think is causing the neck  pain?"     unsure 8. NECK OVERUSE: "Any recent activities that involved turning or twisting the neck?"     no 9. OTHER SYMPTOMS: "Do you have any other symptoms?" (e.g., headache, fever, chest pain, difficulty breathing, neck swelling)     no  Protocols used: Neck Pain or Stiffness-A-AH

## 2024-01-27 ENCOUNTER — Ambulatory Visit: Admitting: Family Medicine

## 2024-01-27 NOTE — Telephone Encounter (Signed)
 Spoke to pt to make sur she did not need to be sen before Daivd Dub is back in the office, she is ok with waiting until she gets back next week

## 2024-02-04 ENCOUNTER — Encounter: Payer: Self-pay | Admitting: Family

## 2024-02-04 ENCOUNTER — Ambulatory Visit (INDEPENDENT_AMBULATORY_CARE_PROVIDER_SITE_OTHER): Admitting: Family

## 2024-02-04 VITALS — BP 126/76 | HR 73 | Temp 97.8°F | Ht 63.0 in | Wt 191.8 lb

## 2024-02-04 DIAGNOSIS — R221 Localized swelling, mass and lump, neck: Secondary | ICD-10-CM | POA: Insufficient documentation

## 2024-02-04 NOTE — Progress Notes (Signed)
 Assessment & Plan:  Neck mass Assessment & Plan: No discrete mass on exam. Patient describes as smaller from prior, mobile.  I suspect reactive lymphadenopathy and that she is feeling her right carotid artery to explain pulsation.  Pending CT neck.   Orders: -     CT SOFT TISSUE NECK W CONTRAST; Future     Return precautions given.   Risks, benefits, and alternatives of the medications and treatment plan prescribed today were discussed, and patient expressed understanding.   Education regarding symptom management and diagnosis given to patient on AVS either electronically or printed.  No follow-ups on file.  Bascom Bossier, FNP  Subjective:    Patient ID: Jacqueline Delgado, female    DOB: [birth year], 73 y.o.   MRN: [id number]  CC: Jacqueline Delgado is a 73 y.o. female who presents today for an acute visit.    HPI: Complains of 'knot' on the right side of neck x 7 days, some smaller.  She can feel pulsation.   Right arm more No cough, fever, unusual weight loss, chills   No history of cancer Never smoker or smokeless tobacco products Thyroid  ultrasound 07/31/2023  multinodular thyroid  gland.  Repeat  1 year Established care with endocrinology 01/14/2024, Dr Vertell Gory  Allergies: Patient has no known allergies. Current Outpatient Medications on File Prior to Visit  Medication Sig Dispense Refill   ACIPHEX  20 MG tablet TAKE 1 TABLET (20 MG TOTAL) BY MOUTH IN THE MORNING AND AT BEDTIME.(DAW 1-BRAND NAME) 180 tablet 3   famotidine  (PEPCID ) 40 MG tablet Take 40 mg by mouth at bedtime.     fluticasone  (FLONASE ) 50 MCG/ACT nasal spray PLACE 2 SPRAYS INTO BOTH NOSTRILS DAILY AS NEEDED FOR ALLERGIES OR RHINITIS. 48 mL 1   losartan  (COZAAR ) 50 MG tablet Take 1 tablet (50 mg total) by mouth daily. 90 tablet 3   ondansetron  (ZOFRAN -ODT) 4 MG disintegrating tablet TAKE 1 TABLET BY MOUTH EVERY 8 HOURS AS NEEDED FOR NAUSEA AND VOMITING 30 tablet 1   tirzepatide  (MOUNJARO ) 12.5 MG/0.5ML Pen  Inject 12.5 mg into the skin once a week. 6 mL 2   amoxicillin  (AMOXIL ) 500 MG capsule Take 2 g PO ( 4 tablets)  30 to 60 minutes before procedure (Patient not taking: Reported on 02/04/2024) 30 capsule 2   No current facility-administered medications on file prior to visit.    Review of Systems  Constitutional:  Negative for chills and fever.  HENT:  Negative for voice change.   Respiratory:  Negative for cough.   Cardiovascular:  Negative for chest pain and palpitations.  Gastrointestinal:  Negative for nausea and vomiting.  Hematological:  Positive for adenopathy.      Objective:    BP 126/76   Pulse 73   Temp 97.8 F (36.6 C) (Oral)   Ht 5\' 3"  (1.6 m)   Wt 191 lb 12.8 oz (87 kg)   SpO2 99%   BMI 33.98 kg/m   BP Readings from Last 3 Encounters:  02/04/24 126/76  01/14/24 100/80  01/01/24 128/82   Wt Readings from Last 3 Encounters:  02/04/24 191 lb 12.8 oz (87 kg)  01/14/24 193 lb (87.5 kg)  01/01/24 195 lb 12.8 oz (88.8 kg)    Physical Exam Vitals reviewed.  Constitutional:      Appearance: She is well-developed.  Eyes:     Conjunctiva/sclera: Conjunctivae normal.  Neck:     Thyroid : No thyroid  mass, thyromegaly or thyroid  tenderness.     Vascular: No  carotid bruit.  Cardiovascular:     Rate and Rhythm: Normal rate and regular rhythm.     Pulses: Normal pulses.     Heart sounds: Normal heart sounds.  Pulmonary:     Effort: Pulmonary effort is normal.     Breath sounds: Normal breath sounds. No wheezing, rhonchi or rales.  Lymphadenopathy:     Head:     Right side of head: No submental, submandibular, tonsillar, preauricular, posterior auricular or occipital adenopathy.     Left side of head: No submental, submandibular, tonsillar, preauricular, posterior auricular or occipital adenopathy.     Cervical: No cervical adenopathy.  Skin:    General: Skin is warm and dry.  Neurological:     Mental Status: She is alert.  Psychiatric:        Speech: Speech  normal.        Behavior: Behavior normal.        Thought Content: Thought content normal.

## 2024-02-04 NOTE — Assessment & Plan Note (Addendum)
 No discrete mass on exam. Patient describes as smaller from prior, mobile.  I suspect reactive lymphadenopathy and that she is feeling her right carotid artery to explain pulsation.  Pending CT neck.

## 2024-02-19 ENCOUNTER — Ambulatory Visit
Admission: RE | Admit: 2024-02-19 | Discharge: 2024-02-19 | Disposition: A | Discharge status: Home / Self Care | Source: Ambulatory Visit | Attending: Family | Admitting: Family

## 2024-02-19 DIAGNOSIS — R221 Localized swelling, mass and lump, neck: Secondary | ICD-10-CM | POA: Insufficient documentation

## 2024-02-19 MED ORDER — IOHEXOL 300 MG/ML  SOLN
75.0000 mL | Freq: Once | INTRAMUSCULAR | Status: AC | PRN
  Administered 2024-02-19: 75 mL via INTRAVENOUS

## 2024-02-19 MED ORDER — SODIUM CHLORIDE 0.9 % IV SOLN: INTRAVENOUS | Status: DC

## 2024-02-25 ENCOUNTER — Telehealth: Payer: Self-pay

## 2024-02-25 ENCOUNTER — Ambulatory Visit: Payer: Self-pay

## 2024-02-25 ENCOUNTER — Emergency Department

## 2024-02-25 ENCOUNTER — Encounter: Payer: Self-pay | Admitting: Emergency Medicine

## 2024-02-25 ENCOUNTER — Other Ambulatory Visit: Payer: Self-pay

## 2024-02-25 ENCOUNTER — Emergency Department
Admission: EM | Admit: 2024-02-25 | Discharge: 2024-02-25 | Disposition: A | Discharge status: Home / Self Care | Attending: Emergency Medicine | Admitting: Emergency Medicine

## 2024-02-25 DIAGNOSIS — M542 Cervicalgia: Secondary | ICD-10-CM | POA: Diagnosis not present

## 2024-02-25 DIAGNOSIS — I10 Essential (primary) hypertension: Secondary | ICD-10-CM | POA: Diagnosis not present

## 2024-02-25 DIAGNOSIS — R0789 Other chest pain: Secondary | ICD-10-CM

## 2024-02-25 DIAGNOSIS — H9201 Otalgia, right ear: Secondary | ICD-10-CM | POA: Diagnosis not present

## 2024-02-25 DIAGNOSIS — R519 Headache, unspecified: Secondary | ICD-10-CM | POA: Insufficient documentation

## 2024-02-25 DIAGNOSIS — R221 Localized swelling, mass and lump, neck: Secondary | ICD-10-CM | POA: Diagnosis not present

## 2024-02-25 LAB — CBC WITH DIFFERENTIAL/PLATELET
Abs Immature Granulocytes: 0.02 10*3/uL (ref 0.00–0.07)
Basophils Absolute: 0 10*3/uL (ref 0.0–0.1)
Basophils Relative: 1 %
Eosinophils Absolute: 0.3 10*3/uL (ref 0.0–0.5)
Eosinophils Relative: 7 %
HCT: 36 % (ref 36.0–46.0)
Hemoglobin: 11.7 g/dL — ABNORMAL LOW (ref 12.0–15.0)
Immature Granulocytes: 0 %
Lymphocytes Relative: 38 %
Lymphs Abs: 1.9 10*3/uL (ref 0.7–4.0)
MCH: 27.2 pg (ref 26.0–34.0)
MCHC: 32.5 g/dL (ref 30.0–36.0)
MCV: 83.7 fL (ref 80.0–100.0)
Monocytes Absolute: 0.4 10*3/uL (ref 0.1–1.0)
Monocytes Relative: 8 %
Neutro Abs: 2.3 10*3/uL (ref 1.7–7.7)
Neutrophils Relative %: 46 %
Platelets: 188 10*3/uL (ref 150–400)
RBC: 4.3 MIL/uL (ref 3.87–5.11)
RDW: 14.1 % (ref 11.5–15.5)
WBC: 4.9 10*3/uL (ref 4.0–10.5)
nRBC: 0 % (ref 0.0–0.2)

## 2024-02-25 LAB — COMPREHENSIVE METABOLIC PANEL WITH GFR
ALT: 8 U/L (ref 0–44)
AST: 14 U/L — ABNORMAL LOW (ref 15–41)
Albumin: 3.8 g/dL (ref 3.5–5.0)
Alkaline Phosphatase: 85 U/L (ref 38–126)
Anion gap: 8 (ref 5–15)
BUN: 17 mg/dL (ref 8–23)
CO2: 24 mmol/L (ref 22–32)
Calcium: 9.7 mg/dL (ref 8.9–10.3)
Chloride: 108 mmol/L (ref 98–111)
Creatinine, Ser: 0.96 mg/dL (ref 0.44–1.00)
GFR, Estimated: >60 mL/min (ref >60)
Glucose, Bld: 79 mg/dL (ref 70–99)
Potassium: 3.4 mmol/L — ABNORMAL LOW (ref 3.5–5.1)
Sodium: 140 mmol/L (ref 135–145)
Total Bilirubin: 0.7 mg/dL (ref 0.0–1.2)
Total Protein: 7 g/dL (ref 6.5–8.1)

## 2024-02-25 LAB — TROPONIN I (HIGH SENSITIVITY): Troponin I (High Sensitivity): 5 ng/L (ref <18)

## 2024-02-25 NOTE — ED Provider Notes (Cosign Needed Addendum)
 [facility] Provider Note    Event Date/Time   First MD Initiated Contact with Patient 02/25/24 1941     (approximate)   History   Mass    HPI  Jacqueline Delgado is a 73 y.o. female    with a past medical history of DDD, osteoarthritis, chronic pain syndrome, lumbar spinal stenosis, cervical foraminal stenosis, arthralgia of acromioclavicular joint, OSA, B12 deficiency, anemia, GERD, hypertension who presents to the ED complaining of mass. According to the patient, she started feeling a pulsatile mass in the anterior right of her neck.  States having daily left chest pain not related with activity, pain does not radiate to the left jaw or the left arm or onto her back. Patient is taking Mounjaro  she cut CT of the thyroid , they found a nodule that is benign.  Patient had a CT of her neck but she is waiting for results.  Patient states that she has hoarseness in the morning, right ear pain, right headache, right nasal congestion right neck pain.  The patient is taking only Tylenol .  Patient denies fever, chills, cough.      Physical Exam   Triage Vital Signs: ED Triage Vitals  Encounter Vitals Group     BP 02/25/24 1926 (!) 136/94     Systolic BP Percentile --      Diastolic BP Percentile --      Pulse Rate 02/25/24 1926 87     Resp 02/25/24 1926 18     Temp 02/25/24 1926 98.4 F (36.9 C)     Temp Source 02/25/24 1926 Oral     SpO2 02/25/24 1926 99 %     Weight 02/25/24 1924 188 lb (85.3 kg)     Height 02/25/24 1924 5\' 3"  (1.6 m)     Head Circumference --      Peak Flow --      Pain Score 02/25/24 1924 4     Pain Loc --      Pain Education --      Exclude from Growth Chart --     Most recent vital signs: Vitals:   02/25/24 1926  BP: (!) 136/94  Pulse: 87  Resp: 18  Temp: 98.4 F (36.9 C)  SpO2: 99%     Constitutional: Alert, NAD. Able to speak in complete sentences without cough or dyspnea  Eyes: Conjunctivae are normal.  Head:  Atraumatic.  No tenderness in maxillary or frontal sinuses Nose: No congestion/rhinnorhea. Mouth/Throat: Mucous membranes are moist.  No peritonsillar erythema, no hypertrophic tonsils Neck: Painless ROM. Supple. No JVD, nodes, thyromegaly,right  anterior pulsatile mass, no bruit.  Left carotid artery is not distended.  Cardiovascular:   Good peripheral circulation.RRR no murmurs, gallops, rubs  Respiratory: Normal respiratory effort.  No retractions. Clear to auscultation bilaterally without wheezing or crackles  Gastrointestinal: Soft and nontender.  Musculoskeletal:  no deformity Neurologic:  MAE spontaneously. No gross focal neurologic deficits are appreciated.  Skin:  Skin is warm, dry and intact. No rash noted. Psychiatric: Mood and affect are normal. Speech and behavior are normal.    ED Results / Procedures / Treatments   Labs (all labs ordered are listed, but only abnormal results are displayed) Labs Reviewed  COMPREHENSIVE METABOLIC PANEL WITH GFR - Abnormal; Notable for the following components:      Result Value   Potassium 3.4 (*)    AST 14 (*)    All other components within normal limits  CBC WITH DIFFERENTIAL/PLATELET -  Abnormal; Notable for the following components:   Hemoglobin 11.7 (*)    All other components within normal limits  TROPONIN I (HIGH SENSITIVITY)  TROPONIN I (HIGH SENSITIVITY)     EKG     RADIOLOGY I independently reviewed and interpreted imaging and agree with radiologists findings.      PROCEDURES:  Critical Care performed:   Procedures   MEDICATIONS ORDERED IN ED: Medications - No data to display Clinical Course as of 02/25/24 2308  Wed Feb 25, 2024  2029 I did not order CT scan of the neck.  I independently review the chart and patient had a CT of the neck with and without contrast on Feb 19, 2024.  I did consult CT office who recommended to call [facility] radiology.  They will read the previous CT. patient was updated [AE]   2057 CBC with Differential(!) Anemia, hemoglobin 11.7.  White blood cells platelets and differential within normal limits [AE]  2127 DG Chest 2 View No active cardiopulmonary disease. [AE]  2127 Hypokalemia, potassium 3.4 [AE]  2148 Troponin I (High Sensitivity) Within normal limits [AE]  2153 Troponin I (High Sensitivity) [AE]    Clinical Course User Index [AE] Awilda Lennox, PA-C    IMPRESSION / MDM / ASSESSMENT AND PLAN / ED COURSE  I reviewed the triage vital signs and the nursing notes.  Differential diagnosis includes, but is not limited to, carotid aneurysm, thyroid  mass, JVD, aortic stenosis, GERD, MI  Patient's presentation is most consistent with acute complicated illness / injury requiring diagnostic workup.   Patient's diagnosis is consistent with right side neck pain, chest pain.  Physical exam is reassuring I independently reviewed and interpreted imaging and agree with radiologists findings ruling out mass, aneurysms. Labs are  reassuring ruling out MI. I did review the patient's allergies and medications.The patient is in stable and satisfactory condition for discharge home  Patient will be discharged home without prescriptions.  Patient is to follow up with PCP and neurologist as needed or otherwise directed. Patient is given ED precautions to return to the ED for any worsening or new symptoms. Discussed plan of care with patient, answered all of patient's questions, Patient agreeable to plan of care. Advised patient to take medications according to the instructions on the label. Discussed possible side effects of new medications. Patient verbalized understanding.    FINAL CLINICAL IMPRESSION(S) / ED DIAGNOSES   Final diagnoses:  Neck pain on right side     Rx / DC Orders   ED Discharge Orders     None        Note:  This document was prepared using Dragon voice recognition software and may include unintentional dictation errors.   Awilda Lennox,  PA-C 02/25/24 2307    Awilda Lennox, PA-C 02/25/24 2308    Viviano Ground, MD 02/27/24 [phone]

## 2024-02-25 NOTE — Discharge Instructions (Signed)
 You have been diagnosed with right neck pain.  Please make an appointment with neurology for a follow-up of your right face pain.  Please come back to ED or go to your PCP if you have new symptoms or symptoms worsen

## 2024-02-25 NOTE — Telephone Encounter (Signed)
 Error

## 2024-02-25 NOTE — Telephone Encounter (Addendum)
 Spoke to  pt  she stated that her entire right side is hurting as a whole, and the mass in her neck hurts as well, pt stated that she is going to ED today to get  checked out to be on the safe side. Please see Previous triage note. I called radiology per request of pt to see if they would get the CT she had done on 02/19/2024 pushed through. They informed me that they would have someone read it today

## 2024-02-25 NOTE — ED Provider Notes (Signed)
 Shared visit   Patient stated that she was having some right sided neck pain, had a recent CT scan.  Today started having nasal congestion and ongoing headache.  Mild sore throat.  States that she has had a CTA in the past to evaluate for aneurysm.  States that she did not have any findings of aneurysm at that time.  Does not have the worst headache of her life.  Does state that she has a history of thyroid  nodule.  Patient without an obvious mass to her neck at this time.  Speaking in full sentences with no stridor and no fullness in her voice.  No thunderclap headache not the worst headache of her life.  I have a low suspicion for subarachnoid hemorrhage.  Discussed CT scan and offered a CTA however patient declined and states that she wants to follow-up with her primary care physician.  Discussed follow-up with primary care and neurology.  Discussed return for any worsening symptoms.   Viviano Ground, MD 02/26/24 [phone]

## 2024-02-25 NOTE — Telephone Encounter (Signed)
 Copied from CRM [id number]. Topic: Clinical - Lab/Test Results >> Feb 25, 2024  3:18 PM Jacqueline Delgado wrote: Reason for CRM: lab results read to her   Chief Complaint: potential cardiac event Symptoms: throat pain, unrelenting headache 9/10, mass to neck is throbbing, neck pain, ear pain, intermittent chest pain, jaw pain, mouth/teeth pain, fatigue, "feel really really lousy,"nausea, sweating Frequency: continual Pertinent Negatives: Patient denies SOB, trouble swallowing, vomiting, numbness, tingling, neck stiffness, dizziness Disposition: [] 911 / [] ED /[] Urgent Care (no appt availability in office) / [] Appointment(In office/virtual)/ []  [place] [facility]/ [] Home Care/ [x] Refused Recommended Disposition /[] [place] [facility]/ []  Follow-up with PCP Additional Notes: Pt called requesting results for 5/8 CT of neck mass after being examined by PCP on 4/23. Advised pt that no final results for CT yet, pt is upset about delay, stating that she is in "lot of pain, very uncomfortable." Pt reporting that the mass was not painful before, but woke up in middle of night last night with sudden onset severe pain to neck mass "throbbing," pain at neck on right side radiating up into ear, also unrelenting headache 9/10 all day, pain also radiating to mouth and teeth. Pt reporting that her run is running "uncontrollably" on right side as well, pt cannot confirm or deny weakness but feels overall fatigue, confirms "don't feel good, feel really, really lousy." Pt states she plans to go to ED this evening after picking up grandson. Pt confirms no chest pain right now then details that she's had intermittent "lot of chest pain" for 1.5 weeks under left breast but thinks from acid reflux, pt "don't know" if acid reflux meds are helping, chest pain lasts longer than 5 min at a time. Pt confirms she is also experiencing nausea today and sweating even when cold under covers. Advised pt call 911 right away. Pt refusing at  this time, states she will go to ED later. Advised strongly that pt call 911 immediately for symptoms. Pt verbalized understanding, states that she will call her son and they will "take it from there." Please advise.  Reason for Disposition  [1] Chest pain lasts > 5 minutes AND [2] age > 97  Answer Assessment - Initial Assessment Questions 1. REASON FOR CALL or QUESTION: "What is your reason for calling today?" or "How can I best help you?" or "What question do you have that I can help answer?"     5/8 had scan done on neck, trying to get results here 2. CALLER: Document the source of call. (e.g., laboratory, patient).     patient  Answer Assessment - Initial Assessment Questions 1. LOCATION: "Where does it hurt?"       Under left breast 2. RADIATION: "Does the pain go anywhere else?" (e.g., into neck, jaw, arms, back)     All right side ear pain mouth pain, teeth pain, headache, neck pain throbbing 3. ONSET: "When did the chest pain begin?" (Minutes, hours or days)      1.5 weeks ago 4. PATTERN: "Does the pain come and go, or has it been constant since it started?"  "Does it get worse with exertion?"      Comes and goes at random 5. DURATION: "How long does it last" (e.g., seconds, minutes, hours)     Oh yeah longer than 5 min 7. CARDIAC RISK FACTORS: "Do you have any history of heart problems or risk factors for heart disease?" (e.g., angina, prior heart attack; diabetes, high blood pressure, high cholesterol, smoker, or strong  family history of heart disease)     significant 8. PULMONARY RISK FACTORS: "Do you have any history of lung disease?"  (e.g., blood clots in lung, asthma, emphysema, birth control pills)     OSA 9. CAUSE: "What do you think is causing the chest pain?"     Acid reflux 10. OTHER SYMPTOMS: "Do you have any other symptoms?" (e.g., dizziness, nausea, vomiting, sweating, fever, difficulty breathing, cough)       Nausea, sweating, throbbing pain to knot in neck,  headache  My situation is I'm sick, throat is killing me, headache, spot is throbbing, lot of pain, very uncomfortable Neck on right side aching really bad, going up into ear Headache all day 9/10 Lozenges not helping, tylenol  not helping Seen originally on 4/23 Knot there Always been big size, unsure if grown Know it's throbbing Pain is worse, wasn't having pain when first saw her No trouble breathing or swallowing Nose running uncontrollably All happening on this right side, nose running , ear hurts, mouth hurts, teeth hurt, head, throbbing No numbness, tingling, stiffness Unsure if weak, whole body feels tired Don't feel good, feel really really lousy Last night went to bed and woke up in middle of night and all this night Going to go to ED later No chest pain right now, lot of chest pain under left breast, had been GI problem in past, went to ED in past thought having heart attack but was just acid reflux Comes and goes every evening lasts for long while Taking mylanta and other med for acid reflux, don't know if helping Comes on, lasts longer than 5 min at a time Nausea today Sweating yes Cold and sweating wet under covers No dizziness Chest pain 1.5 weeks Calling son real quick then will take it from there  Protocols used: PCP Call - No Triage-A-AH, Chest Pain-A-AH

## 2024-02-25 NOTE — ED Triage Notes (Signed)
 Pt presents to the ED via POV with complaints of Mass on the front of her neck x 1 month. Pt was seen by her PCP who ordered a CT but the results are pending. Pt is concerned due to increased aching in her neck for the last several days. Last took Tylenol  PTA and rates the pain 4/10. A&Ox4 at this time. Denies CP or SOB.

## 2024-02-26 ENCOUNTER — Ambulatory Visit: Payer: Self-pay | Admitting: Family

## 2024-02-26 NOTE — Telephone Encounter (Signed)
Seen in ed 

## 2024-03-01 ENCOUNTER — Ambulatory Visit (INDEPENDENT_AMBULATORY_CARE_PROVIDER_SITE_OTHER): Admitting: Family

## 2024-03-01 ENCOUNTER — Encounter: Payer: Self-pay | Admitting: Family

## 2024-03-01 VITALS — BP 132/80 | HR 81 | Temp 97.4°F | Ht 63.0 in | Wt 188.2 lb

## 2024-03-01 DIAGNOSIS — M542 Cervicalgia: Secondary | ICD-10-CM

## 2024-03-01 DIAGNOSIS — D649 Anemia, unspecified: Secondary | ICD-10-CM | POA: Diagnosis not present

## 2024-03-01 DIAGNOSIS — R899 Unspecified abnormal finding in specimens from other organs, systems and tissues: Secondary | ICD-10-CM

## 2024-03-01 DIAGNOSIS — R221 Localized swelling, mass and lump, neck: Secondary | ICD-10-CM | POA: Diagnosis not present

## 2024-03-01 DIAGNOSIS — R519 Headache, unspecified: Secondary | ICD-10-CM | POA: Insufficient documentation

## 2024-03-01 LAB — BASIC METABOLIC PANEL WITH GFR
BUN: 17 mg/dL (ref 6–23)
CO2: 26 meq/L (ref 19–32)
Calcium: 9.6 mg/dL (ref 8.4–10.5)
Chloride: 107 meq/L (ref 96–112)
Creatinine, Ser: 0.81 mg/dL (ref 0.40–1.20)
GFR: 72.71 mL/min (ref >60.00)
Glucose, Bld: 74 mg/dL (ref 70–99)
Potassium: 3.4 meq/L — ABNORMAL LOW (ref 3.5–5.1)
Sodium: 141 meq/L (ref 135–145)

## 2024-03-01 LAB — CBC WITH DIFFERENTIAL/PLATELET
Basophils Absolute: 0 10*3/uL (ref 0.0–0.1)
Basophils Relative: 0.8 % (ref 0.0–3.0)
Eosinophils Absolute: 0.1 10*3/uL (ref 0.0–0.7)
Eosinophils Relative: 2.5 % (ref 0.0–5.0)
HCT: 35.6 % — ABNORMAL LOW (ref 36.0–46.0)
Hemoglobin: 11.5 g/dL — ABNORMAL LOW (ref 12.0–15.0)
Lymphocytes Relative: 44.1 % (ref 12.0–46.0)
Lymphs Abs: 1.7 10*3/uL (ref 0.7–4.0)
MCHC: 32.4 g/dL (ref 30.0–36.0)
MCV: 82.4 fl (ref 78.0–100.0)
Monocytes Absolute: 0.3 10*3/uL (ref 0.1–1.0)
Monocytes Relative: 6.9 % (ref 3.0–12.0)
Neutro Abs: 1.8 10*3/uL (ref 1.4–7.7)
Neutrophils Relative %: 45.7 % (ref 43.0–77.0)
Platelets: 199 10*3/uL (ref 150.0–400.0)
RBC: 4.32 Mil/uL (ref 3.87–5.11)
RDW: 15 % (ref 11.5–15.5)
WBC: 3.9 10*3/uL — ABNORMAL LOW (ref 4.0–10.5)

## 2024-03-01 MED ORDER — NORTRIPTYLINE HCL 10 MG PO CAPS: 10.0000 mg | ORAL_CAPSULE | Freq: Every day | ORAL | 2 refills | Status: DC

## 2024-03-01 NOTE — Progress Notes (Signed)
 Assessment & Plan:  Nonintractable headache, unspecified chronicity pattern, unspecified headache type -     Nortriptyline  HCl; Take 1 capsule (10 mg total) by mouth at bedtime.  Dispense: 120 capsule; Refill: 2  Abnormal laboratory test -     Basic metabolic panel with GFR -     CBC with Differential/Platelet -     Iron, TIBC and Ferritin Panel  Cervicalgia Assessment & Plan: Acute on chronic neck pain. Question if cervicogenic HA.  Advised to make follow up with Dr Naverio , pain management.  H/o OSA  although  not identified on last sleep study; consider repeating sleep study.  Trial of nortriptyline . Close follow up.    Neck mass Assessment & Plan: Reassuring CT neck and reviewed with patient today. Reviewed ED visit.  Patient declines further evaluation of neck mass as not identifiable during today's visit. Offered second opinion and she politely declines. She will let me know how she is doing.      Return precautions given.   Risks, benefits, and alternatives of the medications and treatment plan prescribed today were discussed, and patient expressed understanding.   Education regarding symptom management and diagnosis given to patient on AVS either electronically or printed.  Return in about 6 weeks (around 04/12/2024).  Bascom Bossier, FNP  Subjective:    Patient ID: Jacqueline Delgado, female    DOB: 1951-08-13, 73 y.o.   MRN: [id number]  CC: Jacqueline Delgado is a 73 y.o. female who presents today for follow up.   HPI: Right sided neck mass is less noticeable. She isnt able to palpate anymore.  HA are overall improved in intensity and frequency. She thinks HA d/t to stress; improved with family moved out of her home.  She has chronic posterior neck pain and thinks contributes to HA She will take tylenol  1000mg  with resolution     H/o OSA Last sleep study dx with hypersomnia.    Presented to ED 02/25/24 for right sided mass and neck pain Associated with HA.   CXR without acute findings K 3.4 Hgb 11.7 Troponin negative  CT neck 02/19/24 negative for acute pathology, lymphadenopathy Allergies: Patient has no known allergies. Current Outpatient Medications on File Prior to Visit  Medication Sig Dispense Refill   ACIPHEX  20 MG tablet TAKE 1 TABLET (20 MG TOTAL) BY MOUTH IN THE MORNING AND AT BEDTIME.(DAW 1-BRAND NAME) 180 tablet 3   famotidine  (PEPCID ) 40 MG tablet Take 40 mg by mouth at bedtime.     fluticasone  (FLONASE ) 50 MCG/ACT nasal spray PLACE 2 SPRAYS INTO BOTH NOSTRILS DAILY AS NEEDED FOR ALLERGIES OR RHINITIS. 48 mL 1   losartan  (COZAAR ) 50 MG tablet Take 1 tablet (50 mg total) by mouth daily. 90 tablet 3   ondansetron  (ZOFRAN -ODT) 4 MG disintegrating tablet TAKE 1 TABLET BY MOUTH EVERY 8 HOURS AS NEEDED FOR NAUSEA AND VOMITING 30 tablet 1   tirzepatide  (MOUNJARO ) 12.5 MG/0.5ML Pen Inject 12.5 mg into the skin once a week. 6 mL 2   No current facility-administered medications on file prior to visit.    Review of Systems  Constitutional:  Negative for chills and fever.  Respiratory:  Negative for cough.   Cardiovascular:  Negative for chest pain and palpitations.  Gastrointestinal:  Negative for nausea and vomiting.  Musculoskeletal:  Positive for neck pain.  Neurological:  Positive for headaches. Negative for dizziness.      Objective:    BP 132/80   Pulse 81   Temp (!)  97.4 F (36.3 C) (Oral)   Ht 5\' 3"  (1.6 m)   Wt 188 lb 3.2 oz (85.4 kg)   SpO2 98%   BMI 33.34 kg/m  BP Readings from Last 3 Encounters:  03/01/24 132/80  02/25/24 (!) 135/90  02/04/24 126/76   Wt Readings from Last 3 Encounters:  03/01/24 188 lb 3.2 oz (85.4 kg)  02/25/24 188 lb (85.3 kg)  02/04/24 191 lb 12.8 oz (87 kg)    Physical Exam Vitals reviewed.  Constitutional:      Appearance: She is well-developed.  Eyes:     Conjunctiva/sclera: Conjunctivae normal.  Neck:     Thyroid : No thyroid  mass, thyromegaly or thyroid  tenderness.      Comments: No palpable neck mass Cardiovascular:     Rate and Rhythm: Normal rate and regular rhythm.     Pulses: Normal pulses.     Heart sounds: Normal heart sounds.  Pulmonary:     Effort: Pulmonary effort is normal.     Breath sounds: Normal breath sounds. No wheezing, rhonchi or rales.  Musculoskeletal:     Cervical back: Full passive range of motion without pain. No torticollis. Muscular tenderness present. No pain with movement or spinous process tenderness.  Lymphadenopathy:     Cervical: No cervical adenopathy.     Right cervical: No superficial, deep or posterior cervical adenopathy.    Left cervical: No superficial, deep or posterior cervical adenopathy.  Skin:    General: Skin is warm and dry.  Neurological:     Mental Status: She is alert.  Psychiatric:        Speech: Speech normal.        Behavior: Behavior normal.        Thought Content: Thought content normal.

## 2024-03-02 LAB — IRON,TIBC AND FERRITIN PANEL
%SAT: 13 % — ABNORMAL LOW (ref 16–45)
Ferritin: 50 ng/mL (ref 16–288)
Iron: 39 ug/dL — ABNORMAL LOW (ref 45–160)
TIBC: 289 ug/dL (ref 250–450)

## 2024-03-02 NOTE — Patient Instructions (Signed)
 Trial of nortriptyline  for neck pain, headache prevention  Please reach out to Dr Naviero to schedule a follow up.

## 2024-03-02 NOTE — Assessment & Plan Note (Signed)
 Acute on chronic neck pain. Question if cervicogenic HA.  Advised to make follow up with Dr Naverio , pain management.  H/o OSA  although  not identified on last sleep study; consider repeating sleep study.  Trial of nortriptyline . Close follow up.

## 2024-03-02 NOTE — Assessment & Plan Note (Signed)
 Reassuring CT neck and reviewed with patient today. Reviewed ED visit.  Patient declines further evaluation of neck mass as not identifiable during today's visit. Offered second opinion and she politely declines. She will let me know how she is doing.

## 2024-03-03 ENCOUNTER — Ambulatory Visit (INDEPENDENT_AMBULATORY_CARE_PROVIDER_SITE_OTHER): Payer: 59 | Admitting: *Deleted

## 2024-03-03 ENCOUNTER — Telehealth: Payer: Self-pay | Admitting: *Deleted

## 2024-03-03 VITALS — Ht 63.0 in | Wt 186.0 lb

## 2024-03-03 DIAGNOSIS — G894 Chronic pain syndrome: Secondary | ICD-10-CM | POA: Diagnosis not present

## 2024-03-03 DIAGNOSIS — Z0000 Encounter for general adult medical examination without abnormal findings: Secondary | ICD-10-CM | POA: Diagnosis not present

## 2024-03-03 DIAGNOSIS — G4733 Obstructive sleep apnea (adult) (pediatric): Secondary | ICD-10-CM

## 2024-03-03 DIAGNOSIS — M899 Disorder of bone, unspecified: Secondary | ICD-10-CM

## 2024-03-03 NOTE — Progress Notes (Signed)
 Subjective:   Jacqueline Delgado is a 73 y.o. who presents for a Medicare Wellness preventive visit.  As a reminder, Annual Wellness Visits don't include a physical exam, and some assessments may be limited, especially if this visit is performed virtually. We may recommend an in-person follow-up visit with your provider if needed.  Visit Complete: Virtual I connected with  SEVANA GRANDINETTI on 03/03/24 by a audio enabled telemedicine application and verified that I am speaking with the correct person using two identifiers.  Patient Location: Home  Provider Location: Home Office  I discussed the limitations of evaluation and management by telemedicine. The patient expressed understanding and agreed to proceed.  Vital Signs: Because this visit was a virtual/telehealth visit, some criteria may be missing or patient reported. Any vitals not documented were not able to be obtained and vitals that have been documented are patient reported.  VideoDeclined- This patient declined Librarian, academic. Therefore the visit was completed with audio only.  Persons Participating in Visit: Patient.  AWV Questionnaire: No: Patient Medicare AWV questionnaire was not completed prior to this visit.  Cardiac Risk Factors include: advanced age (>14men, >71 women);hypertension;obesity (BMI >30kg/m2)     Objective:     Today's Vitals   03/03/24 1540  Weight: 186 lb (84.4 kg)  Height: 5\' 3"  (1.6 m)  PainSc: 8    Body mass index is 32.95 kg/m.     03/03/2024    4:02 PM 06/19/2023    8:46 AM 05/26/2023    3:11 PM 04/25/2023   12:16 PM 04/08/2023   11:31 PM 01/09/2023    8:43 AM 11/26/2022    8:45 AM  Advanced Directives  Does Patient Have a Medical Advance Directive? No No No No No No No  Would patient like information on creating a medical advance directive? No - Patient declined    No - Patient declined No - Patient declined     Current Medications (verified) Outpatient  Encounter Medications as of 03/03/2024  Medication Sig   ACIPHEX  20 MG tablet TAKE 1 TABLET (20 MG TOTAL) BY MOUTH IN THE MORNING AND AT BEDTIME.(DAW 1-BRAND NAME)   famotidine  (PEPCID ) 40 MG tablet Take 40 mg by mouth at bedtime.   fluticasone  (FLONASE ) 50 MCG/ACT nasal spray PLACE 2 SPRAYS INTO BOTH NOSTRILS DAILY AS NEEDED FOR ALLERGIES OR RHINITIS.   losartan  (COZAAR ) 50 MG tablet Take 1 tablet (50 mg total) by mouth daily.   nortriptyline  (PAMELOR ) 10 MG capsule Take 1 capsule (10 mg total) by mouth at bedtime.   ondansetron  (ZOFRAN -ODT) 4 MG disintegrating tablet TAKE 1 TABLET BY MOUTH EVERY 8 HOURS AS NEEDED FOR NAUSEA AND VOMITING   tirzepatide  (MOUNJARO ) 12.5 MG/0.5ML Pen Inject 12.5 mg into the skin once a week.   No facility-administered encounter medications on file as of 03/03/2024.    Allergies (verified) Patient has no known allergies.   History: Past Medical History:  Diagnosis Date   Anemia    H/O   Anginal pain (HCC)     3/8-12/21/10   Arthritis    Osteoarthritis in BLE knee   Chronic pain syndrome    Colon polyp    Depression    GERD (gastroesophageal reflux disease)    Hiatal hernia    History of hiatal hernia    History of methicillin resistant staphylococcus aureus (MRSA)    Hypertension    controlled   Obesity    Pre-diabetes    Reflux    Sleep apnea  NO CPAP-SLEEP STUDY FROM 2021 WAS NEGATIVE PER PT   Small vessel disease (HCC)    Past Surgical History:  Procedure Laterality Date   ABDOMINAL HYSTERECTOMY     partial; per patient has left ovary. she states NO CERVIX. No h/o GYN cancer.   BREAST SURGERY     CARPAL TUNNEL RELEASE     CHOLECYSTECTOMY     COLONOSCOPY W/ POLYPECTOMY     adenomatous colon polyp   COLONOSCOPY WITH PROPOFOL  N/A 12/10/2017   Procedure: COLONOSCOPY WITH PROPOFOL ;  Surgeon: Cassie Click, MD;  Location: [facility] ENDOSCOPY;  Service: Endoscopy;  Laterality: N/A;   COLONOSCOPY WITH PROPOFOL  N/A 04/25/2023   Procedure:  COLONOSCOPY WITH PROPOFOL ;  Surgeon: Shane Darling, MD;  Location: [facility] ENDOSCOPY;  Service: Endoscopy;  Laterality: N/A;   ESOPHAGOGASTRODUODENOSCOPY (EGD) WITH PROPOFOL  N/A 10/11/2015   Procedure: ESOPHAGOGASTRODUODENOSCOPY (EGD) WITH PROPOFOL ;  Surgeon: Cassie Click, MD;  Location: [facility] ENDOSCOPY;  Service: Endoscopy;  Laterality: N/A;   ESOPHAGOGASTRODUODENOSCOPY (EGD) WITH PROPOFOL  N/A 12/10/2017   Procedure: ESOPHAGOGASTRODUODENOSCOPY (EGD) WITH PROPOFOL ;  Surgeon: Cassie Click, MD;  Location: [facility] ENDOSCOPY;  Service: Endoscopy;  Laterality: N/A;   ESOPHAGOGASTRODUODENOSCOPY (EGD) WITH PROPOFOL  N/A 05/03/2022   Procedure: ESOPHAGOGASTRODUODENOSCOPY (EGD) WITH PROPOFOL ;  Surgeon: Shane Darling, MD;  Location: [facility] ENDOSCOPY;  Service: Endoscopy;  Laterality: N/A;   JOINT REPLACEMENT Right    knee, Oct 2012   JOINT REPLACEMENT     hopefully getting left partial knee replacement in July 2016   JOINT REPLACEMENT Left    03/06/21   POLYPECTOMY  04/25/2023   Procedure: POLYPECTOMY;  Surgeon: Shane Darling, MD;  Location: [facility] ENDOSCOPY;  Service: Endoscopy;;   SAVORY DILATION N/A 10/11/2015   Procedure: Dixie Frederickson DILATION;  Surgeon: Cassie Click, MD;  Location: [facility] ENDOSCOPY;  Service: Endoscopy;  Laterality: N/A;   SHOULDER ARTHROSCOPY WITH ROTATOR CUFF REPAIR AND SUBACROMIAL DECOMPRESSION Right 06/14/2015   Procedure: SHOULDER ARTHROSCOPY WITH mini open ROTATOR CUFF REPAIR AND SUBACROMIAL DECOMPRESSION, release long head biceps tendon.;  Surgeon: Josephus Nida, MD;  Location: [facility] ORS;  Service: Orthopedics;  Laterality: Right;   TOTAL KNEE ARTHROPLASTY Left 03/06/2021   Procedure: TOTAL KNEE ARTHROPLASTY - Thomos Flies to Assist;  Surgeon: Molli Angelucci, MD;  Location: [facility] ORS;  Service: Orthopedics;  Laterality: Left;   TRIGGER FINGER RELEASE     Family History  Problem Relation Age of Onset   Breast cancer Mother 24   Hyperlipidemia Father    Heart  disease Father    Stroke Father    Hypertension Father    Aneurysm Sister 35       brain aneurysm   Colon cancer Sister 1   Breast cancer Maternal Grandmother        young   Alcoholism Other        brother   Arthritis Other        parent   Diabetes Other        parent and other family member   Thyroid  cancer Neg Hx    Social History   Socioeconomic History   Marital status: Widowed    Spouse name: Not on file   Number of children: 3   Years of education: Not on file   Highest education level: Not on file  Occupational History   Occupation: Retired  Tobacco Use   Smoking status: Never   Smokeless tobacco: Never  Vaping Use   Vaping status: Never Used  Substance and Sexual Activity   Alcohol  use: No   Drug use: No   Sexual activity: Not on file  Other Topics Concern   Not on file  Social History Narrative   Lost a son on march 8th 20+ years ago.    March through Mother's Day is a very hard time every year.    Works with [employer] until 09/2020   Social Drivers of Health   Financial Resource Strain: High Risk (03/03/2024)   Overall Financial Resource Strain (CARDIA)    Difficulty of Paying Living Expenses: Hard  Food Insecurity: Food Insecurity Present (03/03/2024)   Hunger Vital Sign    Worried About Running Out of Food in the Last Year: Often true    Ran Out of Food in the Last Year: Often true  Transportation Needs: No Transportation Needs (03/03/2024)   PRAPARE - Administrator, Civil Service (Medical): No    Lack of Transportation (Non-Medical): No  Physical Activity: Inactive (03/03/2024)   Exercise Vital Sign    Days of Exercise per Week: 0 days    Minutes of Exercise per Session: 0 min  Stress: No Stress Concern Present (03/03/2024)   [employer] of Occupational Health - Occupational Stress Questionnaire    Feeling of Stress : Only a little  Social Connections: Moderately Isolated (03/03/2024)   Social Connection and Isolation Panel  [NHANES]    Frequency of Communication with Friends and Family: More than three times a week    Frequency of Social Gatherings with Friends and Family: More than three times a week    Attends Religious Services: Never    Database administrator or Organizations: Yes    Attends Banker Meetings: Never    Marital Status: Widowed    Tobacco Counseling Counseling given: Not Answered    Clinical Intake:  Pre-visit preparation completed: Yes  Pain : 0-10 Pain Score: 8  Pain Location: Elbow Pain Orientation: Right Pain Descriptors / Indicators: Aching, Sharp, Nagging Pain Onset: Yesterday     BMI - recorded: 32.95 Nutritional Status: BMI > 30  Obese Nutritional Risks: None Diabetes: No  Lab Results  Component Value Date   HGBA1C 5.1 11/20/2023   HGBA1C 5.3 07/25/2023   HGBA1C 5.4 03/25/2023     How often do you need to have someone help you when you read instructions, pamphlets, or other written materials from your doctor or pharmacy?: 1 - Never  Interpreter Needed?: No  Information entered by :: R. Kenyonna Micek LPN   Activities of Daily Living     03/03/2024    3:44 PM  In your present state of health, do you have any difficulty performing the following activities:  Hearing? 0  Vision? 0  Comment glasses  Difficulty concentrating or making decisions? 0  Walking or climbing stairs? 0  Dressing or bathing? 0  Doing errands, shopping? 0  Preparing Food and eating ? N  Using the Toilet? N  In the past six months, have you accidently leaked urine? N  Do you have problems with loss of bowel control? N  Managing your Medications? N  Managing your Finances? N  Housekeeping or managing your Housekeeping? N    Patient Care Team: Calista Catching, FNP as PCP - General (Family Medicine) Constancia Delton, MD as PCP - Cardiology (Cardiology)  Indicate any recent Medical Services you may have received from other than [facility] providers in the past year (date may  be approximate).     Assessment:    This is a routine  wellness examination for [place].  Hearing/Vision screen Hearing Screening - Comments:: No issues Vision Screening - Comments:: glasses   Goals Addressed             This Visit's Progress    Patient Stated       Wants to exercise        Depression Screen     03/03/2024    3:49 PM 03/01/2024   12:33 PM 02/04/2024   12:54 PM 01/01/2024   11:15 AM 07/18/2023   11:07 AM 07/18/2023   11:06 AM 06/19/2023    8:46 AM  PHQ 2/9 Scores  PHQ - 2 Score 0 0 0 0 0 0 0  PHQ- 9 Score 0 0 0 0 0      Fall Risk     03/03/2024    3:47 PM 03/01/2024   12:32 PM 02/04/2024   12:54 PM 01/01/2024   11:14 AM 07/18/2023   11:06 AM  Fall Risk   Falls in the past year? 0 0 0 0 0  Number falls in past yr: 0 0 0 0 0  Injury with Fall? 0 0 0 0 0  Risk for fall due to : No Fall Risks No Fall Risks No Fall Risks No Fall Risks No Fall Risks  Follow up Falls evaluation completed;Falls prevention discussed Falls evaluation completed Falls evaluation completed Falls evaluation completed Falls evaluation completed    MEDICARE RISK AT HOME:  Medicare Risk at Home Any stairs in or around the home?: Yes If so, are there any without handrails?: Yes (on the porch) Home free of loose throw rugs in walkways, pet beds, electrical cords, etc?: Yes Adequate lighting in your home to reduce risk of falls?: Yes Life alert?: No Use of a cane, walker or w/c?: No Grab bars in the bathroom?: No Shower chair or bench in shower?: No Elevated toilet seat or a handicapped toilet?: No  TIMED UP AND GO:  Was the test performed?  No  Cognitive Function: 6CIT completed        03/03/2024    4:02 PM 03/04/2023    3:09 PM 05/13/2019    3:36 PM  6CIT Screen  What Year? 0 points 0 points 0 points  What month? 0 points 0 points 0 points  What time? 0 points 0 points 0 points  Count back from 20 0 points 0 points 0 points  Months in reverse 0 points 0 points 0 points   Repeat phrase 0 points 0 points   Total Score 0 points 0 points     Immunizations Immunization History  Administered Date(s) Administered   Fluad Quad(high Dose 65+) 06/20/2021   Influenza Whole 05/23/2018   PFIZER Comirnaty(Gray Top)Covid-19 Tri-Sucrose Vaccine 03/07/2021, 11/13/2021   PNEUMOCOCCAL CONJUGATE-20 01/01/2024   Pneumococcal Conjugate-13 12/28/2019   Tdap 11/02/2021   Unspecified SARS-COV-2 Vaccination 03/07/2021    Screening Tests Health Maintenance  Topic Date Due   Medicare Annual Wellness (AWV)  03/03/2024   Zoster Vaccines- Shingrix (1 of 2) 04/02/2024 (Originally 02/10/1970)   INFLUENZA VACCINE  05/14/2024   MAMMOGRAM  07/22/2024   Colonoscopy  04/24/2026   DTaP/Tdap/Td (2 - Td or Tdap) 11/03/2031   DEXA SCAN  Completed   Hepatitis C Screening  Completed   HPV VACCINES  Aged Out   Meningococcal B Vaccine  Aged Out   Pneumonia Vaccine 27+ Years old  Discontinued   COVID-19 Vaccine  Discontinued    Health Maintenance  Health Maintenance Due  Topic Date Due  Medicare Annual Wellness (AWV)  03/03/2024   Health Maintenance Items Addressed: Reminded patient to call and schedule her mammogram that was ordered 01/01/24. Patient declines shingles vaccines.   Additional Screening:  Vision Screening: Recommended annual ophthalmology exams for early detection of glaucoma and other disorders of the eye.Up to date [facility]  Dental Screening: Recommended annual dental exams for proper oral hygiene  Community Resource Referral / Chronic Care Management: CRR required this visit?  No   CCM required this visit?  No   Plan:    I have personally reviewed and noted the following in the patient's chart:   Medical and social history Use of alcohol, tobacco or illicit drugs  Current medications and supplements including opioid prescriptions. Patient is not currently taking opioid prescriptions. Functional ability and status Nutritional status Physical  activity Advanced directives List of other physicians Hospitalizations, surgeries, and ER visits in previous 12 months Vitals Screenings to include cognitive, depression, and falls Referrals and appointments  In addition, I have reviewed and discussed with patient certain preventive protocols, quality metrics, and best practice recommendations. A written personalized care plan for preventive services as well as general preventive health recommendations were provided to patient.   Felicitas Horse, LPN   [id number]   After Visit Summary: (MyChart) Due to this being a telephonic visit, the after visit summary with patients personalized plan was offered to patient via MyChart   Notes: Nothing significant to report at this time.  Phone note sent to PCP

## 2024-03-03 NOTE — Telephone Encounter (Signed)
 Spoke to pt she stated that she will try some ice tonight and if it stops hurting she will be ok and if not then she will got to [facility] to have them check it out

## 2024-03-03 NOTE — Telephone Encounter (Signed)
 Called patient and performed her AWV.  FYI Patient stated that her right elbow started hurting real bad yesterday. Patient stated that she used a heating pad last night and it seemed to feel better this morning but now it is hurting again.  Patient stated that she is not sure what she had done to it . Patient was offered an appointment which she declined stating that she is going to [facility] tonight or in the morning.

## 2024-03-03 NOTE — Patient Instructions (Addendum)
 Jacqueline Delgado , Thank you for taking time out of your busy schedule to complete your Annual Wellness Visit with me. I enjoyed our conversation and look forward to speaking with you again next year. I, as well as your care team,  appreciate your ongoing commitment to your health goals. Please review the following plan we discussed and let me know if I can assist you in the future. Your Game plan/ To Do List    Referrals: If you haven't heard from the office you've been referred to, please reach out to them at the phone provided.   A referral has been placed for you to check and see what additional resources are available to you.   If you haven't heard from anyone within the next 7 business days, please call them and let them know a referral has been placed for you Phone: [phone]  Remember to call and schedule your mammogram order was placed 01/01/24 Follow up Visits: Next Medicare AWV with our clinical staff: 03/14/25 @ 9:30   Have you seen your provider in the last 6 months (3 months if uncontrolled diabetes)? Yes Next Office Visit with your provider: Patient will call back later and schedule her follow-up.  Clinician Recommendations:  Aim for 30 minutes of exercise or brisk walking, 6-8 glasses of water, and 5 servings of fruits and vegetables each day.       This is a list of the screening recommended for you and due dates:  Health Maintenance  Topic Date Due   Zoster (Shingles) Vaccine (1 of 2) 04/02/2024*   Flu Shot  05/14/2024   Mammogram  07/22/2024   Medicare Annual Wellness Visit  03/03/2025   Colon Cancer Screening  04/24/2026   DTaP/Tdap/Td vaccine (2 - Td or Tdap) 11/03/2031   DEXA scan (bone density measurement)  Completed   Hepatitis C Screening  Completed   HPV Vaccine  Aged Out   Meningitis B Vaccine  Aged Out   Pneumonia Vaccine  Discontinued   COVID-19 Vaccine  Discontinued  *Topic was postponed. The date shown is not the original due date.    Advanced  directives: (Declined) Advance directive discussed with you today. Even though you declined this today, please call our office should you change your mind, and we can give you the proper paperwork for you to fill out. Advance Care Planning is important because it:  [x]  Makes sure you receive the medical care that is consistent with your values, goals, and preferences  [x]  It provides guidance to your family and loved ones and reduces their decisional burden about whether or not they are making the right decisions based on your wishes.

## 2024-03-04 NOTE — Telephone Encounter (Signed)
 Noted I agree with disposition to go to [facility]

## 2024-03-05 ENCOUNTER — Ambulatory Visit: Payer: Self-pay | Admitting: Family

## 2024-03-05 ENCOUNTER — Encounter: Payer: Self-pay | Admitting: Family

## 2024-03-05 ENCOUNTER — Telehealth: Payer: Self-pay

## 2024-03-05 DIAGNOSIS — I10 Essential (primary) hypertension: Secondary | ICD-10-CM

## 2024-03-05 DIAGNOSIS — E876 Hypokalemia: Secondary | ICD-10-CM

## 2024-03-05 NOTE — Telephone Encounter (Signed)
 Patient states she used ice, heat and Advil  and is a lot better so she did not go to [facility].

## 2024-03-05 NOTE — Progress Notes (Signed)
 Complex Care Management Note  Care Guide Note 03/05/2024 Name: Jacqueline Delgado MRN: [id number] DOB: 14-May-1951  Jacqueline Delgado is a 73 y.o. year old female who sees Arnett, Hanley Lew, FNP for primary care. I reached out to Jacqueline Delgado by phone today to offer complex care management services.  [name] was given information about Complex Care Management services today including:   The Complex Care Management services include support from the care team which includes your Nurse Care Manager, Clinical Social Worker, or Pharmacist.  The Complex Care Management team is here to help remove barriers to the health concerns and goals most important to you. Complex Care Management services are voluntary, and the patient may decline or stop services at any time by request to their care team member.   Complex Care Management Consent Status: Patient agreed to services and verbal consent obtained.   Follow up plan:  Telephone appointment with complex care management team member scheduled for:  [facility] 03/12/2024 and BSW 03/15/2024  Encounter Outcome:  Patient Scheduled  Lenton Rail , RMA     [place]  [facility] Guide  Direct Dial: [phone]  Website: [email or web address]

## 2024-03-12 ENCOUNTER — Other Ambulatory Visit: Payer: Self-pay | Admitting: *Deleted

## 2024-03-12 ENCOUNTER — Encounter: Payer: Self-pay | Admitting: *Deleted

## 2024-03-12 ENCOUNTER — Other Ambulatory Visit: Payer: Self-pay

## 2024-03-12 NOTE — Patient Outreach (Unsigned)
 Complex Care Management   Visit Note  03/12/2024  Name:  Jacqueline Delgado MRN: [id number] DOB: [birth year]  Situation: Referral received for Complex Care Management related to hypertension, anemia, financial strain. I obtained verbal consent from Patient.  Visit completed with Tess Fife on the phone  Background:   Past Medical History:  Diagnosis Date   Anemia    H/O   Anginal pain (HCC)     3/8-12/21/10   Arthritis    Osteoarthritis in BLE knee   Chronic pain syndrome    Colon polyp    Depression    GERD (gastroesophageal reflux disease)    Hiatal hernia    History of hiatal hernia    History of methicillin resistant staphylococcus aureus (MRSA)    Hypertension    controlled   Obesity    Pre-diabetes    Reflux    Sleep apnea    NO CPAP-SLEEP STUDY FROM 2021 WAS NEGATIVE PER PT   Small vessel disease (HCC)     Assessment: Patient Reported Symptoms:  Cognitive Cognitive Status: Alert and oriented to person, place, and time, Normal speech and language skills Cognitive/Intellectual Conditions Management [RPT]: None reported or documented in medical history or problem list   Health Maintenance Behaviors: Annual physical exam, Sleep adequate, Immunizations, Spiritual practice(s) Healing Pattern: Unsure Health Facilitated by: Rest, Prayer/meditation, Pain control  Neurological      HEENT        Cardiovascular      Respiratory      Endocrine      Gastrointestinal        Genitourinary      Integumentary      Musculoskeletal          Psychosocial       Quality of Family Relationships: supportive Do you feel physically threatened by others?: No      03/03/2024    3:49 PM  Depression screen PHQ 2/9  Decreased Interest 0  Down, Depressed, Hopeless 0  PHQ - 2 Score 0  Altered sleeping 0  Tired, decreased energy 0  Change in appetite 0  Feeling bad or failure about yourself  0  Trouble concentrating 0  Moving slowly or fidgety/restless 0  Suicidal  thoughts 0  PHQ-9 Score 0  Difficult doing work/chores Not difficult at all    There were no vitals filed for this visit.  Medications Reviewed Today     Reviewed by Ethelene Herald, RN (Registered Nurse) on 03/12/24 at 0945  Med List Status: <None>   Medication Order Taking? Sig Documenting Provider Last Dose Status Informant  ACIPHEX  20 MG tablet [id number] Yes TAKE 1 TABLET (20 MG TOTAL) BY MOUTH IN THE MORNING AND AT BEDTIME.(DAW 1-BRAND NAME) Calista Catching, FNP Taking Active   famotidine  (PEPCID ) 40 MG tablet [id number] Yes Take 40 mg by mouth at bedtime. [provider] Taking Active   fluticasone  (FLONASE ) 50 MCG/ACT nasal spray [id number] Yes PLACE 2 SPRAYS INTO BOTH NOSTRILS DAILY AS NEEDED FOR ALLERGIES OR RHINITIS. Calista Catching, FNP Taking Active   losartan  (COZAAR ) 50 MG tablet [id number] Yes Take 1 tablet (50 mg total) by mouth daily. Calista Catching, FNP Taking Active   nortriptyline  (PAMELOR ) 10 MG capsule [id number] Yes Take 1 capsule (10 mg total) by mouth at bedtime. Calista Catching, FNP Taking Active   ondansetron  (ZOFRAN -ODT) 4 MG disintegrating tablet [id number] Yes TAKE 1 TABLET BY MOUTH EVERY 8 HOURS AS NEEDED FOR NAUSEA AND VOMITING Calista Catching,  FNP Taking Active            Med Note [name], [name]   Fri Mar 12, 2024  9:43 AM) Takes PRN approximately once a month  tirzepatide  (MOUNJARO ) 12.5 MG/0.5ML Pen [id number] Yes Inject 12.5 mg into the skin once a week. Calista Catching, FNP Taking Active             Recommendation:   PCP Follow-up Follow-up with BSW regarding financial strain  Follow Up Plan:   Telephone follow up appointment date/time:  03/19/24 at 3:00  Michele Ahle, RN, BSN [place]  [facility] RN Care Manager Direct Dial: [phone]  Fax: [phone]

## 2024-03-15 ENCOUNTER — Other Ambulatory Visit: Payer: Self-pay

## 2024-03-15 NOTE — Patient Instructions (Signed)
 Visit Information  Thank you for taking time to visit with me today. Please don't hesitate to contact me if I can be of assistance to you before our next scheduled appointment.  Our next appointment is by telephone on 03/30/24 at 10:30am Please call the care guide team at [phone] if you need to cancel or reschedule your appointment.   Following is a copy of your care plan:   Goals Addressed             This Visit's Progress    BSW VBCI Social Work Care Plan       Problems:   Air traffic controller Insecurity   CSW Clinical Goal(s):   Over the next 30 days the Patient will will follow up with resources provided as directed by Social Work.  Interventions:  Social Determinants of Health in Patient with food and financial strain: SDOH assessments completed: Financial Strain  and Food Insecurity  Evaluation of current treatment plan related to unmet needs SW provided patient resources for food, rent and utilities.  Patient Goals/Self-Care Activities:  Communicate with resources SW provided.  Plan:   SW will follow up on 03/30/24 at 10:30am        Please call the Suicide and Crisis Lifeline: 988 call the [place]  National Suicide Prevention Lifeline: 618-279-4679 or TTY: 607-738-1826 TTY 425 458 9256) to talk to a trained counselor call 1-800-273-TALK (toll free, 24 hour hotline) call 911 if you are experiencing a Mental Health or Behavioral Health Crisis or need someone to talk to.  Patient verbalizes understanding of instructions and care plan provided today and agrees to view in MyChart. Active MyChart status and patient understanding of how to access instructions and care plan via MyChart confirmed with patient.     [name], [name], MHA [place]  [facility] Social Worker, Population Health [phone]

## 2024-03-15 NOTE — Patient Outreach (Signed)
 Complex Care Management   Visit Note  03/15/2024  Name:  Jacqueline Delgado MRN: [id number] DOB: 06/23/1951  Situation: Referral received for Complex Care Management related to SDOH Barriers:  Food insecurity Financial Resource Strain I obtained verbal consent from Patient.  Visit completed with patient  on the phone  Background:   Past Medical History:  Diagnosis Date   Anemia    H/O   Anginal pain (HCC)     3/8-12/21/10   Arthritis    Osteoarthritis in BLE knee   Chronic pain syndrome    Colon polyp    Depression    GERD (gastroesophageal reflux disease)    Hiatal hernia    History of hiatal hernia    History of methicillin resistant staphylococcus aureus (MRSA)    Hypertension    controlled   Obesity    Pre-diabetes    Reflux    Sleep apnea    NO CPAP-SLEEP STUDY FROM 2021 WAS NEGATIVE PER PT   Small vessel disease (HCC)     Assessment: SW completed a telephone outreach with patient. Patient is retired and receiving retirement. Patient does receive foodstamps and 300 in OTC benefits with [employer]. SW and patient agreed for resources for food, rent, and utilities to be emailed to address on file.    SDOH Interventions    Flowsheet Row Patient Outreach Telephone from 03/15/2024 in [place] POPULATION HEALTH DEPARTMENT Patient Outreach Telephone from 03/12/2024 in [place] POPULATION HEALTH DEPARTMENT Clinical Support from 03/03/2024 in [facility] [facility] Clinical Support from 03/04/2023 in [facility] [place] [facility] [employer] Visit from 07/03/2015 in [facility] [place] [facility] [employer]  SDOH Interventions       Food Insecurity Interventions Community Resources Provided Other (Comment)  [Scheduled with Care Management BSW next week] AMB Referral Intervention Not Indicated --  Housing Interventions -- Intervention Not Indicated -- Intervention Not Indicated --  Transportation Interventions -- -- Intervention Not  Indicated Intervention Not Indicated --  Utilities Interventions -- Intervention Not Indicated  [Uses U-Card to pay for utilities] -- Intervention Not Indicated --  Alcohol Usage Interventions -- -- Intervention Not Indicated (Score <7) Intervention Not Indicated (Score <7) --  Depression Interventions/Treatment  -- -- -- Counseling, Medication Medication, Counseling  Financial Strain Interventions Community Resources Provided Other (Comment)  [scheduled with Care Management BSW] Intervention Not Indicated, Other (Comment)  [CRR placed] Intervention Not Indicated --  Physical Activity Interventions -- Intervention Not Indicated  [Has a treadmill and increasing walking time and frequency as tolerated] Intervention Not Indicated Intervention Not Indicated --  Stress Interventions -- Other (Comment)  [due to financial strain. Scheduled with Care Management BSW] Intervention Not Indicated Intervention Not Indicated --  Social Connections Interventions -- Intervention Not Indicated Intervention Not Indicated Intervention Not Indicated --  Health Literacy Interventions -- Intervention Not Indicated Intervention Not Indicated -- --       Recommendation:   No recommendations at this time  Follow Up Plan:   Telephone follow-up on 03/30/24 at 10:30am  [name], BSW, MHA [place]  [facility] Social Worker, Population Health [phone]

## 2024-03-18 ENCOUNTER — Telehealth: Payer: Self-pay

## 2024-03-18 NOTE — Telephone Encounter (Signed)
 Copied from CRM [id number]. Topic: General - Other >> Mar 18, 2024 10:23 AM Allyne Areola wrote: Reason for CRM: [place] healthcare pharmacist [phone], is calling to verify if a fax was received by the office to initiate a statin medication.

## 2024-03-18 NOTE — Telephone Encounter (Signed)
 Spoke to [employer] [phone], to inform her that the fax was received by the office to initiate a statin medication . Placed on provider desk for review

## 2024-03-19 ENCOUNTER — Other Ambulatory Visit: Payer: Self-pay | Admitting: *Deleted

## 2024-03-19 ENCOUNTER — Other Ambulatory Visit: Payer: Self-pay

## 2024-03-19 ENCOUNTER — Encounter: Payer: Self-pay | Admitting: *Deleted

## 2024-03-19 NOTE — Patient Outreach (Signed)
 Complex Care Management   Visit Note  03/19/2024  Name:  Jacqueline Delgado MRN: [id number] DOB: Jul 13, 1951  Situation: Referral received for Complex Care Management related to hypertension, anemia, financial strain. I obtained verbal consent from Patient.  Visit completed with Tess Fife on the phone  Background:   Past Medical History:  Diagnosis Date   Anemia    H/O   Anginal pain (HCC)     3/8-12/21/10   Arthritis    Osteoarthritis in BLE knee   Chronic pain syndrome    Colon polyp    Depression    GERD (gastroesophageal reflux disease)    Hiatal hernia    History of hiatal hernia    History of methicillin resistant staphylococcus aureus (MRSA)    Hypertension    controlled   Obesity    Pre-diabetes    Reflux    Sleep apnea    NO CPAP-SLEEP STUDY FROM 2021 WAS NEGATIVE PER PT   Small vessel disease (HCC)     Assessment: Patient Reported Symptoms:  Cognitive Cognitive Status: Alert and oriented to person, place, and time, Normal speech and language skills Cognitive/Intellectual Conditions Management [RPT]: None reported or documented in medical history or problem list   Health Maintenance Behaviors: Annual physical exam, Healthy diet, Sleep adequate, Immunizations Healing Pattern: Unsure Health Facilitated by: Healthy diet, Rest, Prayer/meditation  Neurological Neurological Review of Symptoms: No symptoms reported    HEENT HEENT Symptoms Reported: Other: Other HEENT Symptoms/Conditions: post nasal drainage that is sometimes blood tinged HEENT Management Strategies: Medication therapy, Fluid modification HEENT Self-Management Outcome: 4 (good) HEENT Comment: continue flonase  as prescribed. Can use saline nasal sprays as often as needed for moisture. Increase fluid intake. Humidifier may be helpful. Discuss with PCP if symptoms persist or worsen.    Cardiovascular Cardiovascular Symptoms Reported: No symptoms reported Does patient have uncontrolled Hypertension?:  No Cardiovascular Conditions: Hypertension Cardiovascular Management Strategies: Weight management, Routine screening, Medication therapy Do You Have a Working Readable Scale?: Yes Weight: 186 lb (84.4 kg) Cardiovascular Self-Management Outcome: 4 (good) Cardiovascular Comment: repeat CBC and potassium level as directed by PCP  Respiratory Respiratory Symptoms Reported: No symptoms reported    Endocrine Patient reports the following symptoms related to hypoglycemia or hyperglycemia : No symptoms reported Is patient diabetic?: No Endocrine Conditions: Other Other Endocrine Conditions: Prediabetic Endocrine Management Strategies: Medication therapy (Mounjaro ) Endocrine Comment: 11/20/23 A1C 5.1%. injecting mounjaro  in stomach but unsure if she's getting the full dose since she has lost a lot of fat in her stomach. Discussed other injection areas and provided education via My Chart. Advised to hold the needle in place for 6 seconds after the medication fully injects. Patient stated understanding.  Gastrointestinal Gastrointestinal Symptoms Reported: No symptoms reported Gastrointestinal Conditions: Reflux/heartburn Gastrointestinal Management Strategies: Diet modification, Medication therapy Gastrointestinal Self-Management Outcome: 4 (good) Nutrition Risk Screen (CP): No indicators present  Genitourinary Genitourinary Symptoms Reported: No symptoms reported    Integumentary Integumentary Symptoms Reported: No symptoms reported    Musculoskeletal Musculoskelatal Symptoms Reviewed: Other Other Musculoskeletal Symptoms: chronic back pain Musculoskeletal Conditions: Back pain, Osteoarthritis Musculoskeletal Management Strategies: Medication therapy, Routine screening Musculoskeletal Self-Management Outcome: 4 (good) Musculoskeletal Comment: Managed by Pain management provider. Scheduled in 2 weeks. Last injection was approximately 1 year ago.      Psychosocial Psychosocial Symptoms Reported:  No symptoms reported   Major Change/Loss/Stressor/Fears (CP): Denies Quality of Family Relationships: supportive Do you feel physically threatened by others?: No      03/03/2024  3:49 PM  Depression screen PHQ 2/9  Decreased Interest 0  Down, Depressed, Hopeless 0  PHQ - 2 Score 0  Altered sleeping 0  Tired, decreased energy 0  Change in appetite 0  Feeling bad or failure about yourself  0  Trouble concentrating 0  Moving slowly or fidgety/restless 0  Suicidal thoughts 0  PHQ-9 Score 0  Difficult doing work/chores Not difficult at all    Vitals:   03/19/24 1534  BP: 123/82    Medications Reviewed Today     Reviewed by Ethelene Herald, RN (Registered Nurse) on 03/19/24 at 1523  Med List Status: <None>   Medication Order Taking? Sig Documenting Provider Last Dose Status Informant  ACIPHEX  20 MG tablet [id number] Yes TAKE 1 TABLET (20 MG TOTAL) BY MOUTH IN THE MORNING AND AT BEDTIME.(DAW 1-BRAND NAME) Calista Catching, FNP Taking Active   famotidine  (PEPCID ) 40 MG tablet [id number] Yes Take 40 mg by mouth at bedtime. [provider] Taking Active   fluticasone  (FLONASE ) 50 MCG/ACT nasal spray [id number] Yes PLACE 2 SPRAYS INTO BOTH NOSTRILS DAILY AS NEEDED FOR ALLERGIES OR RHINITIS. Calista Catching, FNP Taking Active            Med Note [name], [name]   Fri Mar 12, 2024  9:48 AM) PRN but rarely  losartan  (COZAAR ) 50 MG tablet [id number] Yes Take 1 tablet (50 mg total) by mouth daily. Calista Catching, FNP Taking Active   nortriptyline  (PAMELOR ) 10 MG capsule [id number] Yes Take 1 capsule (10 mg total) by mouth at bedtime. Calista Catching, FNP Taking Active   ondansetron  (ZOFRAN -ODT) 4 MG disintegrating tablet [id number] Yes TAKE 1 TABLET BY MOUTH EVERY 8 HOURS AS NEEDED FOR NAUSEA AND VOMITING Calista Catching, FNP Taking Active            Med Note [name], [name]   Fri Mar 12, 2024  9:43 AM) [place] Pear PRN approximately once a month  tirzepatide  (MOUNJARO ) 12.5  MG/0.5ML Pen [id number] Yes Inject 12.5 mg into the skin once a week. Calista Catching, FNP Taking Active             Recommendation:   Continue Current Plan of Care Keep follow-up telephone with BSW  Follow Up Plan:   Telephone follow up appointment date/time:  04/21/24 at 3:30  Michele Ahle, RN, BSN [place]  [facility] RN Care Manager Direct Dial: [phone]  Fax: [phone]

## 2024-03-19 NOTE — Patient Instructions (Signed)
 Visit Information  Thank you for taking time to visit with me today. Please don't hesitate to contact me if I can be of assistance to you before our next scheduled appointment.  Your next care management appointment is by telephone on 04/21/24 at 3:30  Please call the care guide team at [phone] if you need to cancel, schedule, or reschedule an appointment.   Please call the [place]  National Suicide Prevention Lifeline: 319-435-6682 or TTY: (587)254-9906 TTY 5878149789) to talk to a trained counselor call 911 if you are experiencing a Mental Health or Behavioral Health Crisis or need someone to talk to.  Michele Ahle, RN, BSN [place]  [facility] RN Care Manager Direct Dial: [phone]  Fax: [phone]

## 2024-03-22 ENCOUNTER — Telehealth: Payer: Self-pay | Admitting: Family

## 2024-03-22 NOTE — Telephone Encounter (Signed)
 The 10-year ASCVD risk score (Trevonte Ashkar DK, et al., 2019) is: 8.7% Ct calcium score 0  Chart review of ASCVD risk

## 2024-03-23 ENCOUNTER — Telehealth: Payer: Self-pay

## 2024-03-23 ENCOUNTER — Other Ambulatory Visit: Payer: Self-pay | Admitting: Family

## 2024-03-23 NOTE — Telephone Encounter (Signed)
 Copied from CRM [id number]. Topic: General - Other >> Mar 18, 2024 10:23 AM Allyne Areola wrote: Reason for CRM: [place] healthcare pharmacist [phone], is calling to verify if a fax was received by the office to initiate a statin medication. >> Mar 23, 2024 10:24 AM Danae Duncans wrote: [employer] to check status of the fax that was sent for Statin medication- advise it still showing pending review.

## 2024-03-23 NOTE — Telephone Encounter (Signed)
 [employer] healthcare pharmacist @ [phone], gave verbal otp instructions per [facility] of beginning statin due to pt Calcium CT score being zero

## 2024-03-23 NOTE — Telephone Encounter (Signed)
 Noted She is following with cardiology, CT calcium score10/17/25

## 2024-03-29 ENCOUNTER — Other Ambulatory Visit (INDEPENDENT_AMBULATORY_CARE_PROVIDER_SITE_OTHER)

## 2024-03-29 DIAGNOSIS — E876 Hypokalemia: Secondary | ICD-10-CM | POA: Diagnosis not present

## 2024-03-29 DIAGNOSIS — I10 Essential (primary) hypertension: Secondary | ICD-10-CM | POA: Diagnosis not present

## 2024-03-30 ENCOUNTER — Other Ambulatory Visit: Payer: Self-pay

## 2024-03-30 NOTE — Patient Outreach (Signed)
 Complex Care Management   Visit Note  03/30/2024  Name:  Jacqueline Delgado MRN: [id number] DOB: 18-May-1951  Situation: Referral received for Complex Care Management related to SDOH Barriers:  Food insecurity Financial Resource Strain I obtained verbal consent from Patient.  Visit completed with patient   on the phone  Background:   Past Medical History:  Diagnosis Date   Anemia    H/O   Anginal pain (HCC)     3/8-12/21/10   Arthritis    Osteoarthritis in BLE knee   Chronic pain syndrome    Colon polyp    Depression    GERD (gastroesophageal reflux disease)    Hiatal hernia    History of hiatal hernia    History of methicillin resistant staphylococcus aureus (MRSA)    Hypertension    controlled   Obesity    Pre-diabetes    Reflux    Sleep apnea    NO CPAP-SLEEP STUDY FROM 2021 WAS NEGATIVE PER PT   Small vessel disease (HCC)     Assessment: SW completed a telephone outreach with patient, she did receive the resources SW sent. Patient states no other resources are needed at this time. Patient continues to apply for jobs. SW provided contact information for any additional needs.  SDOH Interventions    Flowsheet Row Patient Outreach Telephone from 03/19/2024 in [place] POPULATION HEALTH DEPARTMENT Patient Outreach Telephone from 03/15/2024 in [place] POPULATION HEALTH DEPARTMENT Patient Outreach Telephone from 03/12/2024 in [place] POPULATION HEALTH DEPARTMENT Clinical Support from 03/03/2024 in [facility] Clinical Support from 03/04/2023 in [facility] [place] [facility] [employer] Visit from 07/03/2015 in [facility] [place] [facility] [employer]  SDOH Interventions        Food Insecurity Interventions -- [employer] Resources Provided Other (Comment)  [Scheduled with Care Management BSW next week] AMB Referral Intervention Not Indicated --  Housing Interventions Intervention Not Indicated -- Intervention Not  Indicated -- Intervention Not Indicated --  Transportation Interventions Intervention Not Indicated -- -- Intervention Not Indicated Intervention Not Indicated --  Utilities Interventions -- -- Intervention Not Indicated  [Uses U-Card to pay for utilities] -- Intervention Not Indicated --  Alcohol Usage Interventions -- -- -- Intervention Not Indicated (Score <7) Intervention Not Indicated (Score <7) --  Depression Interventions/Treatment  -- -- -- -- Counseling, Medication Medication, Counseling  Financial Strain Interventions -- [employer] Provided Other (Comment)  [scheduled with Care Management BSW] Intervention Not Indicated, Other (Comment)  [CRR placed] Intervention Not Indicated --  Physical Activity Interventions -- -- Intervention Not Indicated  [Has a treadmill and increasing walking time and frequency as tolerated] Intervention Not Indicated Intervention Not Indicated --  Stress Interventions -- -- Other (Comment)  [due to financial strain. Scheduled with Care Management BSW] Intervention Not Indicated Intervention Not Indicated --  Social Connections Interventions -- -- Intervention Not Indicated Intervention Not Indicated Intervention Not Indicated --  Health Literacy Interventions -- -- Intervention Not Indicated Intervention Not Indicated -- --    Recommendation:   No recommendations at this time  Follow Up Plan:   Patient has met all care management goals. Care Management case will be closed. Patient has been provided contact information should new needs arise.   [name], [name], MHA [place]  [facility] Social Worker, Population Health [phone]

## 2024-03-30 NOTE — Patient Instructions (Signed)

## 2024-04-03 LAB — ALDOSTERONE + RENIN ACTIVITY W/ RATIO
ALDO / PRA Ratio: 23.8 ratio (ref 0.9–28.9)
Aldosterone: 5 ng/dL
Renin Activity: 0.21 ng/mL/h — ABNORMAL LOW (ref 0.25–5.82)

## 2024-04-04 NOTE — Progress Notes (Unsigned)
 PROVIDER NOTE: Interpretation of information contained herein should be left to medically-trained personnel. Specific patient instructions are provided elsewhere under Patient Instructions section of medical record. This document was created in part using AI and STT-dictation technology, any transcriptional errors that may result from this process are unintentional.  Patient: Jacqueline Delgado  Service: E/M   PCP: Dineen Rollene MATSU, FNP  DOB: 08-May-1951  DOS: 04/07/2024  Provider: Eric DELENA Como, MD  MRN: [id number]  Delivery: Face-to-face  Specialty: Interventional Pain Management  Type: Established Patient  Setting: Ambulatory outpatient facility  Specialty designation: 09  Referring Prov.: Dineen Rollene MATSU, FNP  Location: Outpatient office facility       History of present illness (HPI) [name], a 73 y.o. year old female, is here today because of her No primary diagnosis found.. Jacqueline Delgado primary complain today is No chief complaint on file.  Pertinent problems: Jacqueline Delgado has Localized swelling of finger of left hand; Acquired trigger finger; Closed nondisplaced fracture of base of fifth metacarpal bone of right hand; DDD (degenerative disc disease), cervical; DDD (degenerative disc disease), lumbar; Chronic low back pain (1ry area of Pain) (Bilateral) (R>L) w/ sciatica (Right); Neck pain; Non-cardiac chest pain; S/P rotator cuff repair; Osteoarthritis, knee; Unspecified osteoarthritis, unspecified site; Osteoarthritis of knee (Left); Ankle swelling, right; Chronic pain syndrome; DDD (degenerative disc disease), lumbosacral; Lumbar facet hypertrophy; Abnormal MRI, lumbar spine (08/11/20); Lumbosacral central spinal stenosis (9 mm) (L4-5 and L5-S1); Chronic lower extremity pain (Right); Lumbosacral radiculopathy at L5 (Right); Chronic knee pain (3ry area of Pain) (Left); Swelling of foot (4th area of Pain) (Right); Chronic lower extremity pain (2ry area of Pain) (Bilateral) (R>L);  Calcaneal spur of foot (Right); Lumbar Grade 1 Anterolisthesis of L4/L5; Lumbosacral facet syndrome (Bilateral) (R>L); Chronic low back pain (Bilateral) (R>L) w/o sciatica; Spondylosis without myelopathy or radiculopathy, lumbosacral region; S/P TKR (total knee replacement) using cement, left; Chronic knee pain after total replacement of knee joint (Left); Osteoarthritis of lumbar spine; Osteoarthritis involving multiple joints; Acute midline low back pain without sciatica; Chronic Delgado pain (Multifactorial) (Left); Abnormal MRI, cervical spine (07/25/2022); Cervicalgia; Cervical foraminal stenosis (Severe, Left: C3-4, C4-5) (Bilateral: C5-6, C6-7); Radicular pain of Delgado (Left); Cervical radiculopathy (Left) (C4, C5); Osteoarthritis of Delgado (Left); Osteoarthritis of AC (acromioclavicular) joint (Left); Arthralgia of acromioclavicular joint (Left); Upper arm pain (Left); Subacromial bursitis of Delgado joint (Left); Subdeltoid bursitis of Delgado joint (Left); Subscapularis tendinosis (Left); Supraspinatus tendinosis (Left); Lumbar facet joint pain; Complete tear of rotator cuff (Right); and Generalized anxiety disorder on their pertinent problem list.  Pain Assessment: Severity of   is reported as a  /10. Location:    / . Onset:  . Quality:  . Timing:  . Modifying factor(s):  SABRA Vitals:  vitals were not taken for this visit.  BMI: Estimated body mass index is 32.95 kg/m as calculated from the following:   Height as of 03/03/24: 5' 3 (1.6 m).   Weight as of 03/19/24: 186 lb (84.4 kg).  Last encounter: 07/08/2023. Last procedure: 06/19/2023.  Reason for encounter: evaluation of worsening, or previously known (established) problem.   Discussed the use of AI scribe software for clinical note transcription with the patient, who gave verbal consent to proceed.  History of Present Illness           Pharmacotherapy Assessment   Analgesic: No chronic opioid analgesics therapy prescribed by our  practice. Tramadol  50 mg tablet, 1 tab p.o. twice daily (PRN) MME/day: 0-10 mg/day  Monitoring: [place] PMP: PDMP reviewed during this encounter.       Pharmacotherapy: No side-effects or adverse reactions reported. Compliance: No problems identified. Effectiveness: Clinically acceptable.  No notes on file  UDS:  Summary  Date Value Ref Range Status  05/22/2020 Note  Final    Comment:    ==================================================================== Compliance Drug Analysis, Ur ==================================================================== Test                             Result       Flag       Units  Drug Present and Declared for Prescription Verification   Tramadol                        >3268        EXPECTED   ng/mg creat   O-Desmethyltramadol            2372         EXPECTED   ng/mg creat   N-Desmethyltramadol            1509         EXPECTED   ng/mg creat    Source of tramadol  is a prescription medication. O-desmethyltramadol    and N-desmethyltramadol are expected metabolites of tramadol .    Bupropion                       PRESENT      EXPECTED   Hydroxybupropion               PRESENT      EXPECTED    Hydroxybupropion is an expected metabolite of bupropion .  Drug Present not Declared for Prescription Verification   Dextromethorphan                PRESENT      UNEXPECTED   Dextrorphan/Levorphanol        PRESENT      UNEXPECTED    Dextrorphan is an expected metabolite of dextromethorphan , an over-    the-counter or prescription cough suppressant. Dextrorphan cannot be    distinguished from the scheduled prescription medication levorphanol    by the method used for analysis.  ==================================================================== Test                      Result    Flag   Units      Ref Range   Creatinine              153              mg/dL      >=79 ==================================================================== Declared Medications:  The  flagging and interpretation on this report are based on the  following declared medications.  Unexpected results may arise from  inaccuracies in the declared medications.   **Note: The testing scope of this panel includes these medications:   Bupropion  (Wellbutrin )  Tramadol  (Ultram )   **Note: The testing scope of this panel does not include the  following reported medications:   Famotidine  (Pepcid )  Hydrochlorothiazide  (Hydrodiuril )  Losartan  (Cozaar )  Meloxicam (Mobic)  Mirabegron  (Myrbetriq )  Rabeprazole  (Aciphex ) ==================================================================== For clinical consultation, please call [phone]. ====================================================================     No results found for: CBDTHCR No results found for: D8THCCBX No results found for: D9THCCBX  ROS  Constitutional: Denies any fever or chills Gastrointestinal: No reported hemesis, hematochezia, vomiting, or acute GI distress Musculoskeletal: Denies any acute onset  joint swelling, redness, loss of ROM, or weakness Neurological: No reported episodes of acute onset apraxia, aphasia, dysarthria, agnosia, amnesia, paralysis, loss of coordination, or loss of consciousness  Medication Review  RABEprazole , famotidine , fluticasone , losartan , nortriptyline , ondansetron , and tirzepatide   History Review  Allergy: Jacqueline Delgado has no known allergies. Drug: Jacqueline Delgado  reports no history of drug use. Alcohol:  reports no history of alcohol use. Tobacco:  reports that she has never smoked. She has never used smokeless tobacco. Social: Jacqueline Delgado  reports that she has never smoked. She has never used smokeless tobacco. She reports that she does not drink alcohol and does not use drugs. Medical:  has a past medical history of Anemia, Anginal pain (HCC), Arthritis, Chronic pain syndrome, Colon polyp, Depression, GERD (gastroesophageal reflux disease), Hiatal hernia, History of  hiatal hernia, History of methicillin resistant staphylococcus aureus (MRSA), Hypertension, Obesity, Pre-diabetes, Reflux, Sleep apnea, and Small vessel disease (HCC). Surgical: Jacqueline Delgado  has a past surgical history that includes Joint replacement (Right); Joint replacement; Cholecystectomy; Abdominal hysterectomy; Delgado arthroscopy with rotator cuff repair and subacromial decompression (Right, 06/14/2015); Esophagogastroduodenoscopy (egd) with propofol  (N/A, 10/11/2015); Savory dilation (N/A, 10/11/2015); Colonoscopy w/ polypectomy; Esophagogastroduodenoscopy (egd) with propofol  (N/A, 12/10/2017); Colonoscopy with propofol  (N/A, 12/10/2017); Total knee arthroplasty (Left, 03/06/2021); Joint replacement (Left); Esophagogastroduodenoscopy (egd) with propofol  (N/A, 05/03/2022); Breast surgery; Carpal tunnel release; Trigger finger release; Colonoscopy with propofol  (N/A, 04/25/2023); and polypectomy (04/25/2023). Family: family history includes Alcoholism in an other family member; Aneurysm (age of onset: 3) in her sister; Arthritis in an other family member; Breast cancer in her maternal grandmother; Breast cancer (age of onset: 107) in her mother; Colon cancer (age of onset: 70) in her sister; Diabetes in an other family member; Heart disease in her father; Hyperlipidemia in her father; Hypertension in her father; Stroke in her father.  Laboratory Chemistry Profile   Renal Lab Results  Component Value Date   BUN 17 03/01/2024   CREATININE 0.81 03/01/2024   BCR 12 07/28/2023   GFR 72.71 03/01/2024   GFRAA 81 09/13/2020   GFRNONAA >60 02/25/2024    Hepatic Lab Results  Component Value Date   AST 14 (L) 02/25/2024   ALT 8 02/25/2024   ALBUMIN 3.8 02/25/2024   ALKPHOS 85 02/25/2024   LIPASE 36 10/12/2017    Electrolytes Lab Results  Component Value Date   NA 141 03/01/2024   K 3.4 (L) 03/01/2024   CL 107 03/01/2024   CALCIUM 9.6 03/01/2024   MG 2.1 04/09/2023    Bone Lab Results   Component Value Date   VD25OH 21.08 (L) 01/29/2023   25OHVITD1 18 (L) 05/22/2020   25OHVITD2 13 05/22/2020   25OHVITD3 5.3 05/22/2020    Inflammation (CRP: Acute Phase) (ESR: Chronic Phase) Lab Results  Component Value Date   CRP 15 (H) 05/22/2020   ESRSEDRATE 73 (H) 05/22/2020         Note: Above Lab results reviewed.  Recent Imaging Review  DG Chest 2 View CLINICAL DATA:  Mass on the front of her neck x1 month with increased aching in her neck for the last several days.  EXAM: CHEST - 2 VIEW  COMPARISON:  January 29, 2023  FINDINGS: The heart size and mediastinal contours are within normal limits. Both lungs are clear. Multilevel degenerative changes seen throughout the thoracic spine.  IMPRESSION: No active cardiopulmonary disease.  Electronically Signed   By: Suzen Dials M.D.   On: 02/25/2024 21:08 CT SOFT TISSUE NECK W CONTRAST CLINICAL  DATA:  Pulsatile right-sided neck mass  EXAM: CT NECK WITH CONTRAST  TECHNIQUE: Multidetector CT imaging of the neck was performed using the standard protocol following the bolus administration of intravenous contrast.  RADIATION DOSE REDUCTION: This exam was performed according to the departmental dose-optimization program which includes automated exposure control, adjustment of the mA and/or kV according to patient size and/or use of iterative reconstruction technique.  CONTRAST:  75mL OMNIPAQUE  IOHEXOL  300 MG/ML  SOLN  COMPARISON:  06/09/2014  FINDINGS: Pharynx and larynx: Normal. No mass or swelling.  Salivary glands: No inflammation, mass, or stone.  Thyroid : Normal.  Lymph nodes: None enlarged or abnormal density.  Vascular: Negative.  Limited intracranial: Negative.  Visualized orbits: Negative.  Mastoids and visualized paranasal sinuses: Clear.  Skeleton: No acute or aggressive process.  Upper chest: Negative.  Other: None.  IMPRESSION: No acute abnormality of the neck.  No mass  lesion.  Electronically Signed   By: Franky Stanford M.D.   On: 02/25/2024 20:47 Note: Reviewed        Physical Exam  General appearance: Well nourished, well developed, and well hydrated. In no apparent acute distress Mental status: Alert, oriented x 3 (person, place, & time)       Respiratory: No evidence of acute respiratory distress Eyes: PERLA Vitals: There were no vitals taken for this visit. BMI: Estimated body mass index is 32.95 kg/m as calculated from the following:   Height as of 03/03/24: 5' 3 (1.6 m).   Weight as of 03/19/24: 186 lb (84.4 kg). Ideal: Patient weight not recorded  Assessment   Diagnosis Status  No diagnosis found. Controlled Controlled Controlled   Updated Problems: No problems updated.  Plan of Care  Problem-specific:  Assessment and Plan            Jacqueline Delgado has a current medication list which includes the following long-term medication(s): aciphex , fluticasone , losartan , and nortriptyline .  Pharmacotherapy (Medications Ordered): No orders of the defined types were placed in this encounter.  Orders:  No orders of the defined types were placed in this encounter.    Interventional Therapies  Risk Factors  Considerations:  Cerebrovascular malformation  GERD  HTN MO  OSA  SOB No RFA until BMI is below 35 kg/m.   Planned  Pending:   Diagnostic/therapeutic left IA glenohumeral + AC joint inj. + subacromial bursa inj. #1 (01/09/2023)    Under consideration:   Therapeutic left CESI #2    Completed:   Therapeutic left cervical ESI x1 (11/26/2022) (75/75/50/50) (Neck: 100  (L) Delgado: 50)  Therapeutic/palliative bilateral lumbar facet MBB x3 (08/30/2021) (100/100/100/0) pain worsened after injection. Therapeutic right L4-5 LESI x1 (07/27/2020)  Referral to bariatric surgery and medical weight management entered on 07/18/2020.    Completed by other providers:   EEG x2 by Dr. Jannett Fairly (07/03/14, 07/29/14) - WNL   Diagnostic left occipital nerve block x1 (08/10/2014) by Dr. Jannett Fairly [facility] neurology)  Therapeutic right rotator cuff repair x1 (06/14/2015) by HILARIO WENDI Glenn MD [facility] orthopedics)  Therapeutic left IA Synvisc knee injection x2 (10/02/2020) by Morene Redell Sharps, PA [facility] orthopedics and PMR) Therapeutic left total knee replacement x1 (03/06/2021) by Dr. Ozell Flake [facility] orthopedics)   Therapeutic  Palliative (PRN) options:   Palliative bilateral lumbar facet MBB #4  Therapeutic right L4-5 LESI #2       No follow-ups on file.    Recent Visits No visits were found meeting these conditions. Showing recent visits within past 90  days and meeting all other requirements Future Appointments Date Type Provider Dept  04/07/24 Appointment Tanya Glisson, MD [facility]  Showing future appointments within next 90 days and meeting all other requirements  I discussed the assessment and treatment plan with the patient. The patient was provided an opportunity to ask questions and all were answered. The patient agreed with the plan and demonstrated an understanding of the instructions.  Patient advised to call back or seek an in-person evaluation if the symptoms or condition worsens.  Duration of encounter: *** minutes.  Total time on encounter, as per AMA guidelines included both the face-to-face and non-face-to-face time personally spent by the physician and/or other qualified health care professional(s) on the day of the encounter (includes time in activities that require the physician or other qualified health care professional and does not include time in activities normally performed by clinical staff). Physician's time may include the following activities when performed: Preparing to see the patient (e.g., pre-charting review of records, searching for previously ordered imaging, lab work, and nerve conduction tests) Review of prior analgesic pharmacotherapies. Reviewing  PMP Interpreting ordered tests (e.g., lab work, imaging, nerve conduction tests) Performing post-procedure evaluations, including interpretation of diagnostic procedures Obtaining and/or reviewing separately obtained history Performing a medically appropriate examination and/or evaluation Counseling and educating the patient/family/caregiver Ordering medications, tests, or procedures Referring and communicating with other health care professionals (when not separately reported) Documenting clinical information in the electronic or other health record Independently interpreting results (not separately reported) and communicating results to the patient/ family/caregiver Care coordination (not separately reported)  Note by: Glisson DELENA Tanya, MD (TTS and AI technology used. I apologize for any typographical errors that were not detected and corrected.) Date: 04/07/2024; Time: 5:06 PM

## 2024-04-05 ENCOUNTER — Ambulatory Visit: Admitting: Pain Medicine

## 2024-04-06 NOTE — Patient Instructions (Incomplete)

## 2024-04-07 ENCOUNTER — Ambulatory Visit: Attending: Pain Medicine | Admitting: Pain Medicine

## 2024-04-07 ENCOUNTER — Encounter: Payer: Self-pay | Admitting: Pain Medicine

## 2024-04-07 VITALS — BP 142/89 | HR 91 | Temp 97.5°F | Resp 18 | Ht 63.0 in | Wt 179.8 lb

## 2024-04-07 DIAGNOSIS — M5459 Other low back pain: Secondary | ICD-10-CM | POA: Diagnosis not present

## 2024-04-07 DIAGNOSIS — Z09 Encounter for follow-up examination after completed treatment for conditions other than malignant neoplasm: Secondary | ICD-10-CM | POA: Insufficient documentation

## 2024-04-07 DIAGNOSIS — M25562 Pain in left knee: Secondary | ICD-10-CM | POA: Insufficient documentation

## 2024-04-07 DIAGNOSIS — G8929 Other chronic pain: Secondary | ICD-10-CM | POA: Insufficient documentation

## 2024-04-07 DIAGNOSIS — M4310 Spondylolisthesis, site unspecified: Secondary | ICD-10-CM | POA: Diagnosis not present

## 2024-04-07 DIAGNOSIS — M47817 Spondylosis without myelopathy or radiculopathy, lumbosacral region: Secondary | ICD-10-CM | POA: Insufficient documentation

## 2024-04-07 DIAGNOSIS — M47816 Spondylosis without myelopathy or radiculopathy, lumbar region: Secondary | ICD-10-CM | POA: Insufficient documentation

## 2024-04-07 DIAGNOSIS — M4807 Spinal stenosis, lumbosacral region: Secondary | ICD-10-CM

## 2024-04-07 DIAGNOSIS — M5417 Radiculopathy, lumbosacral region: Secondary | ICD-10-CM

## 2024-04-07 DIAGNOSIS — M545 Low back pain, unspecified: Secondary | ICD-10-CM | POA: Diagnosis not present

## 2024-04-07 DIAGNOSIS — M1712 Unilateral primary osteoarthritis, left knee: Secondary | ICD-10-CM | POA: Insufficient documentation

## 2024-04-13 ENCOUNTER — Encounter: Payer: Self-pay | Admitting: Pain Medicine

## 2024-04-13 ENCOUNTER — Ambulatory Visit
Admission: RE | Admit: 2024-04-13 | Discharge: 2024-04-13 | Disposition: A | Discharge status: Home / Self Care | Source: Ambulatory Visit | Attending: Pain Medicine | Admitting: Pain Medicine

## 2024-04-13 ENCOUNTER — Ambulatory Visit (HOSPITAL_BASED_OUTPATIENT_CLINIC_OR_DEPARTMENT_OTHER): Admitting: Pain Medicine

## 2024-04-13 VITALS — BP 158/92 | HR 82 | Temp 97.3°F | Resp 13 | Ht 63.0 in | Wt 179.0 lb

## 2024-04-13 DIAGNOSIS — M545 Low back pain, unspecified: Secondary | ICD-10-CM | POA: Diagnosis not present

## 2024-04-13 DIAGNOSIS — M47816 Spondylosis without myelopathy or radiculopathy, lumbar region: Secondary | ICD-10-CM | POA: Insufficient documentation

## 2024-04-13 DIAGNOSIS — G8929 Other chronic pain: Secondary | ICD-10-CM | POA: Insufficient documentation

## 2024-04-13 DIAGNOSIS — M5459 Other low back pain: Secondary | ICD-10-CM | POA: Insufficient documentation

## 2024-04-13 DIAGNOSIS — M4310 Spondylolisthesis, site unspecified: Secondary | ICD-10-CM | POA: Insufficient documentation

## 2024-04-13 DIAGNOSIS — M47817 Spondylosis without myelopathy or radiculopathy, lumbosacral region: Secondary | ICD-10-CM | POA: Insufficient documentation

## 2024-04-13 MED ORDER — ROPIVACAINE HCL 2 MG/ML IJ SOLN
18.0000 mL | Freq: Once | INTRAMUSCULAR | Status: AC
  Administered 2024-04-13: 18 mL via PERINEURAL

## 2024-04-13 MED ORDER — FENTANYL CITRATE (PF) 100 MCG/2ML IJ SOLN
INTRAMUSCULAR | Status: AC
  Filled 2024-04-13: qty 2

## 2024-04-13 MED ORDER — LIDOCAINE HCL 2 % IJ SOLN
INTRAMUSCULAR | Status: AC
  Filled 2024-04-13: qty 20

## 2024-04-13 MED ORDER — PENTAFLUOROPROP-TETRAFLUOROETH EX AERO
INHALATION_SPRAY | Freq: Once | CUTANEOUS | Status: AC
  Administered 2024-04-13: 30 via TOPICAL

## 2024-04-13 MED ORDER — FENTANYL CITRATE (PF) 100 MCG/2ML IJ SOLN
25.0000 ug | INTRAMUSCULAR | Status: DC | PRN
  Administered 2024-04-13: 50 ug via INTRAVENOUS

## 2024-04-13 MED ORDER — ROPIVACAINE HCL 2 MG/ML IJ SOLN
INTRAMUSCULAR | Status: AC
Start: 2024-04-13 — End: 2024-04-13
  Filled 2024-04-13: qty 20

## 2024-04-13 MED ORDER — TRIAMCINOLONE ACETONIDE 40 MG/ML IJ SUSP
INTRAMUSCULAR | Status: AC
  Filled 2024-04-13: qty 2

## 2024-04-13 MED ORDER — MIDAZOLAM HCL 5 MG/5ML IJ SOLN
INTRAMUSCULAR | Status: AC
  Filled 2024-04-13: qty 5

## 2024-04-13 MED ORDER — LIDOCAINE HCL 2 % IJ SOLN
20.0000 mL | Freq: Once | INTRAMUSCULAR | Status: AC
  Administered 2024-04-13: 400 mg

## 2024-04-13 MED ORDER — MIDAZOLAM HCL 5 MG/5ML IJ SOLN
0.5000 mg | Freq: Once | INTRAMUSCULAR | Status: AC
  Administered 2024-04-13: 2 mg via INTRAVENOUS
  Administered 2024-04-13: 1 mg via INTRAVENOUS

## 2024-04-13 MED ORDER — IOHEXOL 180 MG/ML  SOLN: 10.0000 mL | Freq: Once | INTRAMUSCULAR | Status: DC

## 2024-04-13 MED ORDER — TRIAMCINOLONE ACETONIDE 40 MG/ML IJ SUSP
80.0000 mg | Freq: Once | INTRAMUSCULAR | Status: AC
  Administered 2024-04-13: 80 mg

## 2024-04-13 MED ORDER — IOHEXOL 180 MG/ML  SOLN
INTRAMUSCULAR | Status: AC
  Filled 2024-04-13: qty 10

## 2024-04-13 NOTE — Progress Notes (Signed)
 PROVIDER NOTE: Interpretation of information contained herein should be left to medically-trained personnel. Specific patient instructions are provided elsewhere under Patient Instructions section of medical record. This document was created in part using STT-dictation technology, any transcriptional errors that may result from this process are unintentional.  Patient: Jacqueline Delgado Type: Established DOB: [birth year] MRN: [id number] PCP: Dineen Rollene MATSU, FNP  Service: Procedure DOS: 04/13/2024 Setting: Ambulatory Location: Ambulatory outpatient facility Delivery: Face-to-face Provider: Eric DELENA Como, MD Specialty: Interventional Pain Management Specialty designation: 09 Location: Outpatient facility Ref. Prov.: Como Eric, MD       Interventional Therapy   Type: Lumbar Facet, Medial Branch Block(s)   #5  Laterality: Bilateral  Level: L2, L3, L4, L5, and S1 Medial Branch/Dorsal Rami Level(s). Injecting these levels blocks the L3-4, L4-5, and L5-S1 lumbar facet joints. Imaging: Fluoroscopic guidance Spinal ([id number]) Anesthesia: Local anesthesia (1-2% Lidocaine ) Anxiolysis: IV Versed  3.0 mg Sedation: Moderate Sedation Fentanyl  1 mL (50 mcg) DOS: 04/13/2024 Performed by: Eric DELENA Como, MD  Primary Purpose: Diagnostic/Therapeutic Indications: Low back pain severe enough to impact quality of life or function. 1. Chronic low back pain (Bilateral) (R>L) w/o sciatica   2. Lumbar Grade 1 Anterolisthesis of L4/L5   3. Lumbosacral facet syndrome (Bilateral) (R>L)   4. Osteoarthritis of lumbar spine   5. Spondylosis without myelopathy or radiculopathy, lumbosacral region   6. Lumbar facet joint pain   7. Lumbar facet hypertrophy   8. Low back pain of over 3 months duration   9. Intermittent low back pain   10. Multifactorial low back pain    NAS-11 Pain score:   Pre-procedure: 4 /10   Post-procedure: 0-No pain/10     Position / Prep / Materials:  Position: Prone   Prep solution: ChloraPrep (2% chlorhexidine  gluconate and 70% isopropyl alcohol) Area Prepped: Posterolateral Lumbosacral Spine (Wide prep: From the lower border of the scapula down to the end of the tailbone and from flank to flank.)  Materials:  Tray: Block Needle(s):  Type: Spinal  Gauge (G): 22  Length: 5-in Qty: 4     H&P (Pre-op Assessment):  Jacqueline Delgado is a 73 y.o. (year old), female patient, seen today for interventional treatment. She  has a past surgical history that includes Joint replacement (Right); Joint replacement; Cholecystectomy; Abdominal hysterectomy; Shoulder arthroscopy with rotator cuff repair and subacromial decompression (Right, 06/14/2015); Esophagogastroduodenoscopy (egd) with propofol  (N/A, 10/11/2015); Savory dilation (N/A, 10/11/2015); Colonoscopy w/ polypectomy; Esophagogastroduodenoscopy (egd) with propofol  (N/A, 12/10/2017); Colonoscopy with propofol  (N/A, 12/10/2017); Total knee arthroplasty (Left, 03/06/2021); Joint replacement (Left); Esophagogastroduodenoscopy (egd) with propofol  (N/A, 05/03/2022); Breast surgery; Carpal tunnel release; Trigger finger release; Colonoscopy with propofol  (N/A, 04/25/2023); and polypectomy (04/25/2023). Jacqueline Delgado has a current medication list which includes the following prescription(s): aciphex , famotidine , fluticasone , losartan , nortriptyline , ondansetron , and tirzepatide , and the following Facility-Administered Medications: fentanyl  and iohexol . Her primarily concern today is the Back Pain  Initial Vital Signs:  Pulse/HCG Rate: 90ECG Heart Rate: 86 Temp: 98.1 F (36.7 C) Resp: 18 BP: (!) 147/101 SpO2: 99 %  BMI: Estimated body mass index is 31.71 kg/m as calculated from the following:   Height as of this encounter: 5' 3 (1.6 m).   Weight as of this encounter: 179 lb (81.2 kg).  Risk Assessment: Allergies: Reviewed. She has no known allergies.  Allergy Precautions: None required Coagulopathies: Reviewed. None  identified.  Blood-thinner therapy: None at this time Active Infection(s): Reviewed. None identified. [name] is afebrile  Site Confirmation: Jacqueline Delgado was asked to confirm  the procedure and laterality before marking the site Procedure checklist: Completed Consent: Before the procedure and under the influence of no sedative(s), amnesic(s), or anxiolytics, the patient was informed of the treatment options, risks and possible complications. To fulfill our ethical and legal obligations, as recommended by the American Medical Association's Code of Ethics, I have informed the patient of my clinical impression; the nature and purpose of the treatment or procedure; the risks, benefits, and possible complications of the intervention; the alternatives, including doing nothing; the risk(s) and benefit(s) of the alternative treatment(s) or procedure(s); and the risk(s) and benefit(s) of doing nothing. The patient was provided information about the general risks and possible complications associated with the procedure. These may include, but are not limited to: failure to achieve desired goals, infection, bleeding, organ or nerve damage, allergic reactions, paralysis, and death. In addition, the patient was informed of those risks and complications associated to Spine-related procedures, such as failure to decrease pain; infection (i.e.: Meningitis, epidural or intraspinal abscess); bleeding (i.e.: epidural hematoma, subarachnoid hemorrhage, or any other type of intraspinal or peri-dural bleeding); organ or nerve damage (i.e.: Any type of peripheral nerve, nerve root, or spinal cord injury) with subsequent damage to sensory, motor, and/or autonomic systems, resulting in permanent pain, numbness, and/or weakness of one or several areas of the body; allergic reactions; (i.e.: anaphylactic reaction); and/or death. Furthermore, the patient was informed of those risks and complications associated with the medications.  These include, but are not limited to: allergic reactions (i.e.: anaphylactic or anaphylactoid reaction(s)); adrenal axis suppression; blood sugar elevation that in diabetics may result in ketoacidosis or comma; water retention that in patients with history of congestive heart failure may result in shortness of breath, pulmonary edema, and decompensation with resultant heart failure; weight gain; swelling or edema; medication-induced neural toxicity; particulate matter embolism and blood vessel occlusion with resultant organ, and/or nervous system infarction; and/or aseptic necrosis of one or more joints. Finally, the patient was informed that Medicine is not an exact science; therefore, there is also the possibility of unforeseen or unpredictable risks and/or possible complications that may result in a catastrophic outcome. The patient indicated having understood very clearly. We have given the patient no guarantees and we have made no promises. Enough time was given to the patient to ask questions, all of which were answered to the patient's satisfaction. Ms. Inlow has indicated that she wanted to continue with the procedure. Attestation: I, the ordering provider, attest that I have discussed with the patient the benefits, risks, side-effects, alternatives, likelihood of achieving goals, and potential problems during recovery for the procedure that I have provided informed consent. Date  Time: 04/13/2024  8:10 AM  Pre-Procedure Preparation:  Monitoring: As per clinic protocol. Respiration, ETCO2, SpO2, BP, heart rate and rhythm monitor placed and checked for adequate function Safety Precautions: Patient was assessed for positional comfort and pressure points before starting the procedure. Time-out: I initiated and conducted the Time-out before starting the procedure, as per protocol. The patient was asked to participate by confirming the accuracy of the Time Out information. Verification of the correct  person, site, and procedure were performed and confirmed by me, the nursing staff, and the patient. Time-out conducted as per Joint Commission's Universal Protocol (UP.01.01.01). Time: 0850 Start Time: 0851 hrs.  Description of Procedure:          Laterality: (see above) Targeted Levels: (see above)  Safety Precautions: Aspiration looking for blood return was conducted prior to all injections. At no  point did we inject any substances, as a needle was being advanced. Before injecting, the patient was told to immediately notify me if she was experiencing any new onset of ringing in the ears, or metallic taste in the mouth. No attempts were made at seeking any paresthesias. Safe injection practices and needle disposal techniques used. Medications properly checked for expiration dates. SDV (single dose vial) medications used. After the completion of the procedure, all disposable equipment used was discarded in the proper designated medical waste containers. Local Anesthesia: Protocol guidelines were followed. The patient was positioned over the fluoroscopy table. The area was prepped in the usual manner. The time-out was completed. The target area was identified using fluoroscopy. A 12-in long, straight, sterile hemostat was used with fluoroscopic guidance to locate the targets for each level blocked. Once located, the skin was marked with an approved surgical skin marker. Once all sites were marked, the skin (epidermis, dermis, and hypodermis), as well as deeper tissues (fat, connective tissue and muscle) were infiltrated with a small amount of a short-acting local anesthetic, loaded on a 10cc syringe with a 25G, 1.5-in  Needle. An appropriate amount of time was allowed for local anesthetics to take effect before proceeding to the next step. Local Anesthetic: Lidocaine  2.0% The unused portion of the local anesthetic was discarded in the proper designated containers. Technical description of process:   Medial Branch  Dorsal Rami Nerve Block (MBB):  Neuroanatomy note: Each lumbar facet joint receives dual innervation from medial branches arising from the posterior primary rami at the same level and one level above. The target for each lumbar medial branch is the junction of the ipsilateral superior articular and transverse process of the lower vertebral body. (i.e.: The L4-L5 facet joint is innervated by the L4 medial branch [located at L5] and the L3 medial branch [located at L4]. Blocking the L4 Medial Branch is therefore achieved by injecting at the junction of the ipsilateral superior articular and transverse process of the lower vertebral body [L5].).  Exception: The exception to the above rule is the L5-S1 facet joint which has triple innervation requiring the L4 medial branch, as well as the L5 and the S1 Dorsal Rami(s) to be blocked to fully denervate the joint.  Under fluoroscopic guidance, a needle was inserted until contact was made with os over the target area. After negative aspiration, 0.5 mL of the nerve block solution was injected without difficulty or complication. Paresthesia were avoided during injection. The needle(s) were removed intact and without complication.  Once the entire procedure was completed, the treated area was cleaned, making sure to leave some of the prepping solution back to take advantage of its long term bactericidal properties.         Illustration of the posterior view of the lumbar spine and the posterior neural structures. Laminae of L2 through S1 are labeled. DPRL5, dorsal primary ramus of L5; DPRS1, dorsal primary ramus of S1; DPR3, dorsal primary ramus of L3; FJ, facet (zygapophyseal) joint L3-L4; I, inferior articular process of L4; LB1, lateral branch of dorsal primary ramus of L1; IAB, inferior articular branches from L3 medial branch (supplies L4-L5 facet joint); IBP, intermediate branch plexus; MB3, medial branch of dorsal primary ramus of L3; NR3,  third lumbar nerve root; S, superior articular process of L5; SAB, superior articular branches from L4 (supplies L4-5 facet joint also); TP3, transverse process of L3.   Facet Joint Innervation (* possible contribution)  L1-2 T12, L1 (L2*)  Medial Branch  L2-3 L1, L2 (L3*)                     L3-4 L2, L3 (L4*)                     L4-5 L3, L4 (L5*)                     L5-S1 L4, L5, S1                        Vitals:   04/13/24 0903 04/13/24 0910 04/13/24 0920 04/13/24 0930  BP: (!) 139/90 (!) 148/82 134/83 (!) 158/92  Pulse:      Resp: 15 12 12 13   Temp:  (!) 97.4 F (36.3 C)  (!) 97.3 F (36.3 C)  SpO2: 99% 98% 98% 98%  Weight:      Height:         End Time: 0902 hrs.  Imaging Guidance (Spinal):         Type of Imaging Technique: Fluoroscopy Guidance (Spinal) Indication(s): Fluoroscopy guidance for needle placement to enhance accuracy in procedures requiring precise needle localization for targeted delivery of medication in or near specific anatomical locations not easily accessible without such real-time imaging assistance. Exposure Time: Please see nurses notes. Contrast: None used. Fluoroscopic Guidance: I was personally present during the use of fluoroscopy. Tunnel Vision Technique used to obtain the best possible view of the target area. Parallax error corrected before commencing the procedure. Direction-depth-direction technique used to introduce the needle under continuous pulsed fluoroscopy. Once target was reached, antero-posterior, oblique, and lateral fluoroscopic projection used confirm needle placement in all planes. Images permanently stored in EMR. Interpretation: No contrast injected. I personally interpreted the imaging intraoperatively. Adequate needle placement confirmed in multiple planes. Permanent images saved into the patient's record.  Post-operative Assessment:  Post-procedure Vital Signs:  Pulse/HCG Rate: 8276 Temp: (!) 97.3 F (36.3  C) Resp: 13 BP: (!) 158/92 SpO2: 98 %  EBL: None  Complications: No immediate post-treatment complications observed by team, or reported by patient.  Note: The patient tolerated the entire procedure well. A repeat set of vitals were taken after the procedure and the patient was kept under observation following institutional policy, for this type of procedure. Post-procedural neurological assessment was performed, showing return to baseline, prior to discharge. The patient was provided with post-procedure discharge instructions, including a section on how to identify potential problems. Should any problems arise concerning this procedure, the patient was given instructions to immediately contact us , at any time, without hesitation. In any case, we plan to contact the patient by telephone for a follow-up status report regarding this interventional procedure.  Comments:  No additional relevant information.  Plan of Care (POC)  Orders:  Orders Placed This Encounter  Procedures   LUMBAR FACET(MEDIAL BRANCH NERVE BLOCK) MBNB    Scheduling Instructions:     Procedure: Lumbar facet block (AKA.: Lumbosacral medial branch nerve block)     Side: Bilateral     Level: L3-4, L4-5, and L5-S1 Facets (L2, L3, L4, L5, and S1 Medial Branch Nerves)     Sedation: Patient's choice.     Date: 04/13/2024    Where will this procedure be performed?:   [facility]   DG [facility] C-ARM 1-60 MIN NO REPORT    Intraoperative interpretation by procedural physician at [facility].    Standing Status:   Standing  Number of Occurrences:   1    Reason for exam::   Assistance in needle guidance and placement for procedures requiring needle placement in or near specific anatomical locations not easily accessible without such assistance.   Informed Consent Details: Physician/Practitioner Attestation; Transcribe to consent form and obtain patient signature    Nursing Order: Transcribe to consent form  and obtain patient signature. Note: Always confirm laterality of pain with Ms. Glomski, before procedure.    Physician/Practitioner attestation of informed consent for procedure/surgical case:   I, the physician/practitioner, attest that I have discussed with the patient the benefits, risks, side effects, alternatives, likelihood of achieving goals and potential problems during recovery for the procedure that I have provided informed consent.    Procedure:   Lumbar Facet Block  under fluoroscopic guidance    Physician/Practitioner performing the procedure:   Yoav Okane A. Tanya MD    Indication/Reason:   Low Back Pain, with our without leg pain, due to Facet Joint Arthralgia (Joint Pain) Spondylosis (Arthritis of the Spine), without myelopathy or radiculopathy (Nerve Damage).   Provide equipment / supplies at bedside    Procedure tray: Block Tray (Disposable  single use) Skin infiltration needle: Regular 1.5-in, 25-G, (x1) Block Needle type: Spinal Amount/quantity: 4 Size: Medium (5-inch) Gauge: 22G    Standing Status:   Standing    Number of Occurrences:   1    Specify:   Block Tray   Saline lock IV    Have LR [phone] available and administer at 125 mL/hr if patient becomes hypotensive.    Standing Status:   Standing    Number of Occurrences:   1    Opioid Analgesic(s):  Analgesic: No chronic opioid analgesics therapy prescribed by our practice. Tramadol  50 mg tablet, 1 tab p.o. twice daily (PRN) MME/day: 0-10 mg/day    Medications ordered for procedure: Meds ordered this encounter  Medications   iohexol  (OMNIPAQUE ) 180 MG/ML injection 10 mL    Must be Myelogram-compatible. If not available, you may substitute with a water-soluble, non-ionic, hypoallergenic, myelogram-compatible radiological contrast medium.   lidocaine  (XYLOCAINE ) 2 % (with pres) injection 400 mg   pentafluoroprop-tetrafluoroeth (GEBAUERS) aerosol   midazolam  (VERSED ) 5 MG/5ML injection 0.5-2 mg    Make  sure Flumazenil is available in the pyxis when using this medication. If oversedation occurs, administer 0.2 mg IV over 15 sec. If after 45 sec no response, administer 0.2 mg again over 1 min; may repeat at 1 min intervals; not to exceed 4 doses (1 mg)   fentaNYL  (SUBLIMAZE ) injection 25-50 mcg    Make sure Narcan is available in the pyxis when using this medication. In the event of respiratory depression (RR< 8/min): Titrate NARCAN (naloxone) in increments of 0.1 to 0.2 mg IV at 2-3 minute intervals, until desired degree of reversal.   ropivacaine  (PF) 2 mg/mL (0.2%) (NAROPIN ) injection 18 mL   triamcinolone  acetonide (KENALOG -40) injection 80 mg   Medications administered: We administered lidocaine , pentafluoroprop-tetrafluoroeth, midazolam , fentaNYL , ropivacaine  (PF) 2 mg/mL (0.2%), and triamcinolone  acetonide.  See the medical record for exact dosing, route, and time of administration.    Interventional Therapies  Risk Factors  Considerations:  Cerebrovascular malformation  GERD  HTN MO  OSA  SOB No RFA until BMI is below 35 kg/m.   Planned  Pending:   Therapeutic/palliative bilateral lumbar facet MBB #5 (first of 2025)   Under consideration:   Therapeutic/palliative bilateral lumbar facet MBB #5  Therapeutic left CESI #2    Completed:  Diagnostic/therapeutic left IA glenohumeral + AC joint inj. + subacromial bursa inj. x1 (01/09/2023) (100/100/70/70 x 4 months) Therapeutic left cervical ESI x1 (11/26/2022) (75/75/50/50) (Neck: 100  (L) shoulder: 50)  Therapeutic/palliative bilateral lumbar facet MBB x4 (06/19/2023) (100/100/100/100 x 6 months)  Therapeutic right L4-5 LESI x1 (07/27/2020)  Referral to bariatric surgery and medical weight management entered on 07/18/2020.    Completed by other providers:   EEG x2 by Dr. Jannett Fairly (07/03/14, 07/29/14) - WNL  Diagnostic left occipital nerve block x1 (08/10/2014) by Dr. Jannett Fairly [facility] neurology)  Therapeutic right rotator  cuff repair x1 (06/14/2015) by HILARIO WENDI Glenn MD [facility] orthopedics)  Therapeutic left IA Synvisc knee injection x2 (10/02/2020) by Morene Redell Sharps, PA [facility] orthopedics and [facility]) Therapeutic left total knee replacement x1 (03/06/2021) by Dr. Ozell Flake [facility] orthopedics)   Therapeutic  Palliative (PRN) options:   Palliative bilateral lumbar facet MBB #4  Therapeutic right L4-5 LESI #2       Follow-up plan:   Return in about 2 weeks (around 04/27/2024) for (Face2F), (PPE).     Recent Visits Date Type Provider Dept  04/07/24 Office Visit Tanya Glisson, MD [facility]  Showing recent visits within past 90 days and meeting all other requirements Today's Visits Date Type Provider Dept  04/13/24 Procedure visit Tanya Glisson, MD [facility]  Showing today's visits and meeting all other requirements Future Appointments Date Type Provider Dept  04/27/24 Appointment Tanya Glisson, MD [facility]  Showing future appointments within next 90 days and meeting all other requirements   Disposition: Discharge home  Discharge (Date  Time): 04/13/2024; 0942 hrs.   Primary Care Physician: Dineen Rollene MATSU, FNP Location: [facility] Note by: Glisson DELENA Tanya, MD (TTS technology used. I apologize for any typographical errors that were not detected and corrected.) Date: 04/13/2024; Time: 10:09 AM  Disclaimer:  Medicine is not an Visual merchandiser. The only guarantee in medicine is that nothing is guaranteed. It is important to note that the decision to proceed with this intervention was based on the information collected from the patient. The Data and conclusions were drawn from the patient's questionnaire, the interview, and the physical examination. Because the information was provided in large part by the patient, it cannot be guaranteed that it has not been purposely or unconsciously manipulated. Every effort has been made to  obtain as much relevant data as possible for this evaluation. It is important to note that the conclusions that lead to this procedure are derived in large part from the available data. Always take into account that the treatment will also be dependent on availability of resources and existing treatment guidelines, considered by other Pain Management Practitioners as being common knowledge and practice, at the time of the intervention. For Medico-Legal purposes, it is also important to point out that variation in procedural techniques and pharmacological choices are the acceptable norm. The indications, contraindications, technique, and results of the above procedure should only be interpreted and judged by a Board-Certified Interventional Pain Specialist with extensive familiarity and expertise in the same exact procedure and technique.

## 2024-04-13 NOTE — Patient Instructions (Signed)

## 2024-04-13 NOTE — Progress Notes (Signed)
 Safety precautions to be maintained throughout the outpatient stay will include: orient to surroundings, keep bed in low position, maintain call bell within reach at all times, provide assistance with transfer out of bed and ambulation.

## 2024-04-14 ENCOUNTER — Ambulatory Visit: Payer: Self-pay | Admitting: *Deleted

## 2024-04-14 ENCOUNTER — Ambulatory Visit (INDEPENDENT_AMBULATORY_CARE_PROVIDER_SITE_OTHER): Admitting: Internal Medicine

## 2024-04-14 ENCOUNTER — Ambulatory Visit
Admission: RE | Admit: 2024-04-14 | Discharge: 2024-04-14 | Disposition: A | Discharge status: Home / Self Care | Source: Ambulatory Visit | Attending: Family | Admitting: Family

## 2024-04-14 ENCOUNTER — Encounter: Payer: Self-pay | Admitting: Internal Medicine

## 2024-04-14 ENCOUNTER — Telehealth: Payer: Self-pay

## 2024-04-14 VITALS — BP 152/95 | HR 89 | Ht 63.0 in | Wt 179.8 lb

## 2024-04-14 DIAGNOSIS — M7592 Shoulder lesion, unspecified, left shoulder: Secondary | ICD-10-CM | POA: Diagnosis not present

## 2024-04-14 DIAGNOSIS — Z1231 Encounter for screening mammogram for malignant neoplasm of breast: Secondary | ICD-10-CM | POA: Diagnosis not present

## 2024-04-14 DIAGNOSIS — I10 Essential (primary) hypertension: Secondary | ICD-10-CM

## 2024-04-14 MED ORDER — HYDROXYZINE HCL 25 MG PO TABS: 25.0000 mg | ORAL_TABLET | Freq: Three times a day (TID) | ORAL | 0 refills | Status: DC | PRN

## 2024-04-14 NOTE — Patient Instructions (Addendum)
 I enjoyed meeting you and will be praying for God to bless you with a job!   For your blood pressure,  I recommend that you Increase your losartan  dose to 100 mg daily starting today  Send us  readings in about a week    Stop watching the news , especially when you are checking your blood pressure !!!

## 2024-04-14 NOTE — Telephone Encounter (Signed)
 No issues post-procedure.

## 2024-04-14 NOTE — Progress Notes (Unsigned)
 Subjective:  Patient ID: Jacqueline Delgado, female    DOB: 1951-09-11  Age: 73 y.o. MRN: [id number]  CC: The primary encounter diagnosis was Essential hypertension. A diagnosis of Supraspinatus tendinosis (Left) was also pertinent to this visit.   HPI Jacqueline Delgado presents for  Chief Complaint  Patient presents with  . Medical Management of Chronic Issues    Elevated blood pressure   1) HTN with elevated blood pressure readings for the last several days on losartan  50 mg daily.  Noted to be elevated to 140/101 yesterday at [facility] office , 160 with a manual cuff on the right arm . Repeat readingn was lower 139.  At home after her procedure,  she checked and it was 150/95.  She notes that her medication had been taken on different schedule because of her procedure yesterday,  so she checked it repeatedly  and this morning BP was still 150 .  Has been taking advil  prn 800 mg ,  last dose was Saturday , none on sun mon or tues .  Using tylenol .    Increased stress due to financial strains. Can't find a job , has been on multiple interviews but feels she is being discriminated due to age.  Having trouble making ends meet . Noyt sleeping well. Prior tiralso ftrazoodne even with double dose did not helpd  Felt puffy with nortriptyline   Outpatient Medications Prior to Visit  Medication Sig Dispense Refill  . ACIPHEX  20 MG tablet TAKE 1 TABLET (20 MG TOTAL) BY MOUTH IN THE MORNING AND AT BEDTIME.(DAW 1-BRAND NAME) 180 tablet 3  . famotidine  (PEPCID ) 40 MG tablet Take 40 mg by mouth at bedtime.    . fluticasone  (FLONASE ) 50 MCG/ACT nasal spray PLACE 2 SPRAYS INTO BOTH NOSTRILS DAILY AS NEEDED FOR ALLERGIES OR RHINITIS. 48 mL 1  . losartan  (COZAAR ) 50 MG tablet Take 1 tablet (50 mg total) by mouth daily. 90 tablet 3  . ondansetron  (ZOFRAN -ODT) 4 MG disintegrating tablet TAKE 1 TABLET BY MOUTH EVERY 8 HOURS AS NEEDED FOR NAUSEA AND VOMITING 30 tablet 1  . tirzepatide  (MOUNJARO ) 12.5 MG/0.5ML Pen  Inject 12.5 mg into the skin once a week. 6 mL 2  . nortriptyline  (PAMELOR ) 10 MG capsule Take 1 capsule (10 mg total) by mouth at bedtime. (Patient not taking: Reported on 04/14/2024) 120 capsule 2   No facility-administered medications prior to visit.    Review of Systems;  Patient denies headache, fevers, malaise, unintentional weight loss, skin rash, eye pain, sinus congestion and sinus pain, sore throat, dysphagia,  hemoptysis , cough, dyspnea, wheezing, chest pain, palpitations, orthopnea, edema, abdominal pain, nausea, melena, diarrhea, constipation, flank pain, dysuria, hematuria, urinary  Frequency, nocturia, numbness, tingling, seizures,  Focal weakness, Loss of consciousness,  Tremor, insomnia, depression,, and suicidal ideation.      Objective:  BP (!) 152/95   Pulse 89   Ht 5' 3 (1.6 m)   Wt 179 lb 12.8 oz (81.6 kg)   SpO2 98%   BMI 31.85 kg/m   BP Readings from Last 3 Encounters:  04/14/24 (!) 152/95  04/13/24 (!) 158/92  04/07/24 (!) 142/89    Wt Readings from Last 3 Encounters:  04/14/24 179 lb 12.8 oz (81.6 kg)  04/13/24 179 lb (81.2 kg)  04/07/24 179 lb 12.8 oz (81.6 kg)    Physical Exam Vitals reviewed.  Constitutional:      General: She is not in acute distress.    Appearance: Normal appearance. She is normal  weight. She is not ill-appearing, toxic-appearing or diaphoretic.  HENT:     Head: Normocephalic.  Eyes:     General: No scleral icterus.       Right eye: No discharge.        Left eye: No discharge.     Conjunctiva/sclera: Conjunctivae normal.  Cardiovascular:     Rate and Rhythm: Normal rate and regular rhythm.     Heart sounds: Normal heart sounds.  Pulmonary:     Effort: Pulmonary effort is normal. No respiratory distress.     Breath sounds: Normal breath sounds.  Musculoskeletal:        General: Normal range of motion.  Skin:    General: Skin is warm and dry.  Neurological:     General: No focal deficit present.     Mental Status:  She is alert and oriented to person, place, and time. Mental status is at baseline.  Psychiatric:        Mood and Affect: Mood normal.        Behavior: Behavior normal.        Thought Content: Thought content normal.        Judgment: Judgment normal.    Lab Results  Component Value Date   HGBA1C 5.1 11/20/2023   HGBA1C 5.3 07/25/2023   HGBA1C 5.4 03/25/2023    Lab Results  Component Value Date   CREATININE 0.81 03/01/2024   CREATININE 0.96 02/25/2024   CREATININE 0.96 11/20/2023    Lab Results  Component Value Date   WBC 3.9 (L) 03/01/2024   HGB 11.5 (L) 03/01/2024   HCT 35.6 (L) 03/01/2024   PLT 199.0 03/01/2024   GLUCOSE 74 03/01/2024   CHOL 142 03/25/2023   TRIG 91.0 03/25/2023   HDL 43.00 03/25/2023   LDLCALC 81 03/25/2023   ALT 8 02/25/2024   AST 14 (L) 02/25/2024   NA 141 03/01/2024   K 3.4 (L) 03/01/2024   CL 107 03/01/2024   CREATININE 0.81 03/01/2024   BUN 17 03/01/2024   CO2 26 03/01/2024   TSH 1.83 01/07/2024   HGBA1C 5.1 11/20/2023    DG PAIN CLINIC C-ARM 1-60 MIN NO REPORT Result Date: 04/13/2024 Fluoro was used, but no Radiologist interpretation will be provided. Please refer to NOTES tab for provider progress note.   Assessment & Plan:  .Essential hypertension Assessment & Plan: Currently taking losartan  50 mg daily since last visit    Supraspinatus tendinosis (Left)  Other orders -     hydrOXYzine  HCl; Take 1 tablet (25 mg total) by mouth 3 (three) times daily as needed.  Dispense: 30 tablet; Refill: 0     I spent 34 minutes on the day of this face to face encounter reviewing patient's  most recent visit with cardiology,  nephrology,  and neurology,  prior relevant surgical and non surgical procedures, recent  labs and imaging studies, counseling on weight management,  reviewing the assessment and plan with patient, and post visit ordering and reviewing of  diagnostics and therapeutics with patient  .   Follow-up: No follow-ups on  file.   Verneita LITTIE Kettering, MD

## 2024-04-14 NOTE — Assessment & Plan Note (Addendum)
 Currently taking losartan  50 mg daily since last visit .  Advised to increase dose to 100 mg daily. Recurrent hypokalemia despite stopping hydrochlorothiazide   and PRA/PAC ratio > 20 Is suggestive of hyperaldosteronism.  Will add spironolactone after discussing with PCP Arnette  Lab Results  Component Value Date   CREATININE 0.81 03/01/2024   Lab Results  Component Value Date   NA 141 03/01/2024   K 3.4 (L) 03/01/2024   CL 107 03/01/2024   CO2 26 03/01/2024

## 2024-04-14 NOTE — Telephone Encounter (Signed)
Spoke with pt and scheduled her for an appt today with Dr. Darrick Huntsman.

## 2024-04-14 NOTE — Telephone Encounter (Signed)
 Copied from CRM [id number]. Topic: Clinical - Red Word Triage >> Apr 14, 2024  8:05 AM Franky GRADE wrote: Red Word that prompted transfer to Nurse Triage: Patient is experiencing high blood pressure 158/95, she is also feeling fatigued and yesterday she did not feel like herself slight dizziness. Reason for Disposition  [1] Taking BP medications AND [2] feels is having side effects (e.g., impotence, cough, dizzy upon standing)    Fatigue and dizzy/off balance  Answer Assessment - Initial Assessment Questions 1. BLOOD PRESSURE: What is the blood pressure? Did you take at least two measurements 5 minutes apart?     My BP is high. 158/95 this morning.    Yesterday 153/98.   157/90 and 164/98. 2. ONSET: When did you take your blood pressure?     This morning 158/95. Yesterday I found this out at the pain clinic.  It was 141/91 at the pain clinic.   3. HOW: How did you take your blood pressure? (e.g., automatic home BP monitor, visiting nurse)     Taken at the pain clinic.    At the home I got the elevated readings too.  4. HISTORY: Do you have a history of high blood pressure?     Yes 5. MEDICINES: Are you taking any medicines for blood pressure? Have you missed any doses recently?     Yes I take Losartan .    Taking it daily in the mornings.  I waited an hour after taking my pill to take my BP. I'm on a new medication Nortriptyline .    I stopped taking it on Sat. Night.  I felt bloated with it.  I had gained a couple of pounds. I feel better since stopping the Nortriptyline .  It increased my appetite too while on it.   6. OTHER SYMPTOMS: Do you have any symptoms? (e.g., blurred vision, chest pain, difficulty breathing, headache, weakness)     Fatigue and slight dizziness yesterday 7. PREGNANCY: Is there any chance you are pregnant? When was your last menstrual period?     N/A due to age  Protocols used: Blood Pressure - High-A-AH FYI Only or Action Required?: Action required by  provider: request for appointment.  Patient was last seen in primary care on 03/01/2024 by Dineen Rollene MATSU, FNP. Called Nurse Triage reporting Hypertension. Symptoms began several days ago. Interventions attempted: Prescription medications: Taking Losartan  daily.  Having fatigue and dizziness with elevated BP.SABRA Symptoms are: gradually worsening.  Triage Disposition: See PCP When Office is Open (Within 3 Days)  Patient/caregiver understands and will follow disposition?: Yes Is it possible pt can be seen sooner than July 9?

## 2024-04-18 MED ORDER — LOSARTAN POTASSIUM 100 MG PO TABS: 100.0000 mg | ORAL_TABLET | Freq: Every day | ORAL | 1 refills | Status: DC

## 2024-04-21 ENCOUNTER — Other Ambulatory Visit: Payer: Self-pay | Admitting: *Deleted

## 2024-04-21 ENCOUNTER — Encounter: Payer: Self-pay | Admitting: *Deleted

## 2024-04-21 ENCOUNTER — Ambulatory Visit: Admitting: Family

## 2024-04-26 ENCOUNTER — Other Ambulatory Visit: Payer: Self-pay | Admitting: Family

## 2024-04-27 ENCOUNTER — Telehealth: Admitting: *Deleted

## 2024-04-27 ENCOUNTER — Telehealth: Payer: Self-pay

## 2024-04-27 ENCOUNTER — Ambulatory Visit: Admitting: Pain Medicine

## 2024-04-27 NOTE — Progress Notes (Signed)
 Complex Care Management Care Guide Note  04/27/2024 Name: [name] MRN: [id number] DOB: [birth year]  Jacqueline Delgado is a 73 y.o. year old female who is a primary care patient of Dineen Rollene MATSU, FNP and is actively engaged with the care management team. I reached out to Jacqueline Delgado by phone today to assist with re-scheduling  with the RN Case Manager.  Follow up plan: Patient requested to cancel appointment.    Leotis Rase [facility], [facility] Guide  Direct Dial: [phone]  Fax [phone]

## 2024-05-04 ENCOUNTER — Ambulatory Visit: Payer: Self-pay | Admitting: Family

## 2024-05-12 ENCOUNTER — Encounter: Payer: Self-pay | Admitting: Family

## 2024-05-12 ENCOUNTER — Encounter: Admitting: Family

## 2024-05-16 ENCOUNTER — Emergency Department
Admission: EM | Admit: 2024-05-16 | Discharge: 2024-05-16 | Disposition: A | Discharge status: Home / Self Care | Attending: Emergency Medicine | Admitting: Emergency Medicine

## 2024-05-16 ENCOUNTER — Other Ambulatory Visit: Payer: Self-pay

## 2024-05-16 ENCOUNTER — Emergency Department

## 2024-05-16 DIAGNOSIS — R42 Dizziness and giddiness: Secondary | ICD-10-CM | POA: Diagnosis not present

## 2024-05-16 DIAGNOSIS — N3090 Cystitis, unspecified without hematuria: Secondary | ICD-10-CM | POA: Diagnosis not present

## 2024-05-16 DIAGNOSIS — R404 Transient alteration of awareness: Secondary | ICD-10-CM | POA: Diagnosis not present

## 2024-05-16 DIAGNOSIS — K5730 Diverticulosis of large intestine without perforation or abscess without bleeding: Secondary | ICD-10-CM | POA: Diagnosis not present

## 2024-05-16 DIAGNOSIS — E860 Dehydration: Secondary | ICD-10-CM | POA: Insufficient documentation

## 2024-05-16 DIAGNOSIS — I10 Essential (primary) hypertension: Secondary | ICD-10-CM | POA: Diagnosis not present

## 2024-05-16 DIAGNOSIS — Z743 Need for continuous supervision: Secondary | ICD-10-CM | POA: Diagnosis not present

## 2024-05-16 DIAGNOSIS — R110 Nausea: Secondary | ICD-10-CM | POA: Diagnosis not present

## 2024-05-16 DIAGNOSIS — K7689 Other specified diseases of liver: Secondary | ICD-10-CM | POA: Diagnosis not present

## 2024-05-16 DIAGNOSIS — R55 Syncope and collapse: Secondary | ICD-10-CM | POA: Insufficient documentation

## 2024-05-16 DIAGNOSIS — R109 Unspecified abdominal pain: Secondary | ICD-10-CM | POA: Diagnosis not present

## 2024-05-16 DIAGNOSIS — G4489 Other headache syndrome: Secondary | ICD-10-CM | POA: Diagnosis not present

## 2024-05-16 LAB — COMPREHENSIVE METABOLIC PANEL WITH GFR
ALT: 10 U/L (ref 0–44)
AST: 20 U/L (ref 15–41)
Albumin: 3.3 g/dL — ABNORMAL LOW (ref 3.5–5.0)
Alkaline Phosphatase: 82 U/L (ref 38–126)
Anion gap: 8 (ref 5–15)
BUN: 18 mg/dL (ref 8–23)
CO2: 23 mmol/L (ref 22–32)
Calcium: 9.1 mg/dL (ref 8.9–10.3)
Chloride: 112 mmol/L — ABNORMAL HIGH (ref 98–111)
Creatinine, Ser: 1.15 mg/dL — ABNORMAL HIGH (ref 0.44–1.00)
GFR, Estimated: 51 mL/min — ABNORMAL LOW (ref >60)
Glucose, Bld: 75 mg/dL (ref 70–99)
Potassium: 3.7 mmol/L (ref 3.5–5.1)
Sodium: 143 mmol/L (ref 135–145)
Total Bilirubin: 1.1 mg/dL (ref 0.0–1.2)
Total Protein: 6.8 g/dL (ref 6.5–8.1)

## 2024-05-16 LAB — URINALYSIS, ROUTINE W REFLEX MICROSCOPIC
Bilirubin Urine: NEGATIVE
Glucose, UA: NEGATIVE mg/dL
Hgb urine dipstick: NEGATIVE
Ketones, ur: 5 mg/dL — AB
Nitrite: POSITIVE — AB
Protein, ur: 30 mg/dL — AB
Specific Gravity, Urine: 1.025 (ref 1.005–1.030)
pH: 5 (ref 5.0–8.0)

## 2024-05-16 LAB — CBC
HCT: 38.7 % (ref 36.0–46.0)
Hemoglobin: 12.1 g/dL (ref 12.0–15.0)
MCH: 27.2 pg (ref 26.0–34.0)
MCHC: 31.3 g/dL (ref 30.0–36.0)
MCV: 87 fL (ref 80.0–100.0)
Platelets: 241 K/uL (ref 150–400)
RBC: 4.45 MIL/uL (ref 3.87–5.11)
RDW: 14.5 % (ref 11.5–15.5)
WBC: 3.6 K/uL — ABNORMAL LOW (ref 4.0–10.5)
nRBC: 0 % (ref 0.0–0.2)

## 2024-05-16 LAB — TSH: TSH: 1.212 u[IU]/mL (ref 0.350–4.500)

## 2024-05-16 LAB — CBG MONITORING, ED
Glucose-Capillary: 55 mg/dL — ABNORMAL LOW (ref 70–99)
Glucose-Capillary: 111 mg/dL — ABNORMAL HIGH (ref 70–99)
Glucose-Capillary: 92 mg/dL (ref 70–99)

## 2024-05-16 MED ORDER — CEPHALEXIN 500 MG PO CAPS: 500.0000 mg | ORAL_CAPSULE | Freq: Two times a day (BID) | ORAL | 0 refills | Status: DC

## 2024-05-16 MED ORDER — ONDANSETRON HCL 4 MG/2ML IJ SOLN
4.0000 mg | Freq: Once | INTRAMUSCULAR | Status: AC
  Administered 2024-05-16: 4 mg via INTRAVENOUS
  Filled 2024-05-16: qty 2

## 2024-05-16 MED ORDER — SODIUM CHLORIDE 0.9 % IV BOLUS
1000.0000 mL | Freq: Once | INTRAVENOUS | Status: AC
  Administered 2024-05-16: 1000 mL via INTRAVENOUS

## 2024-05-16 MED ORDER — CEPHALEXIN 500 MG PO CAPS
500.0000 mg | ORAL_CAPSULE | Freq: Once | ORAL | Status: AC
  Administered 2024-05-16: 500 mg via ORAL
  Filled 2024-05-16: qty 1

## 2024-05-16 NOTE — ED Notes (Signed)
 Pt given sandwich tray, peanut butter and crackers and OJ.

## 2024-05-16 NOTE — ED Provider Notes (Signed)
 [facility] Provider Note    Event Date/Time   First MD Initiated Contact with Patient 05/16/24 1753     (approximate)   History   Chief Complaint: Near Syncope   HPI  Jacqueline Delgado is a 73 y.o. female with a history of hypertension, GERD, chronic low back pain who comes ED complaining of near syncope.  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  She was helped to the ground without falling, given some candy by a bystander.  Denies orthostatic symptoms today.  No recent illness falls trauma chest pain fever.  She does endorse urinary frequency.  She does note that she has had insufficient water intake today.  Denies any acute pain.  No headache paresthesias or motor weakness.        Past Medical History:  Diagnosis Date   Anemia    H/O   Anginal pain (HCC)     3/8-12/21/10   Arthritis    Osteoarthritis in BLE knee   Chronic pain syndrome    Colon polyp    Depression    GERD (gastroesophageal reflux disease)    Hiatal hernia    History of hiatal hernia    History of methicillin resistant staphylococcus aureus (MRSA)    Hypertension    controlled   Obesity    Pre-diabetes    Reflux    Sleep apnea    NO CPAP-SLEEP STUDY FROM 2021 WAS NEGATIVE PER PT   Small vessel disease [facility])     Current Outpatient Rx   Order #: [id number] Class: Normal   Order #: [id number] Class: Normal   Order #: [id number] Class: Historical Med   Order #: [id number] Class: Normal   Order #: [id number] Class: Normal   Order #:  [id number] Class: Normal   Order #: [id number] Class: Normal   Order #: [id number] Class: Normal    Past Surgical History:  Procedure Laterality Date   ABDOMINAL HYSTERECTOMY     partial; per patient has left ovary. she states NO CERVIX. No h/o GYN cancer.   BREAST SURGERY     CARPAL TUNNEL RELEASE     CHOLECYSTECTOMY     COLONOSCOPY W/ POLYPECTOMY     adenomatous colon polyp   COLONOSCOPY WITH PROPOFOL  N/A 12/10/2017   Procedure: COLONOSCOPY WITH PROPOFOL ;  Surgeon: Viktoria Lamar DASEN, MD;  Location: [facility] ENDOSCOPY;  Service: Endoscopy;  Laterality: N/A;   COLONOSCOPY WITH PROPOFOL  N/A 04/25/2023   Procedure: COLONOSCOPY WITH PROPOFOL ;  Surgeon: Maryruth Ole DASEN, MD;  Location: [facility] ENDOSCOPY;  Service: Endoscopy;  Laterality: N/A;   ESOPHAGOGASTRODUODENOSCOPY (EGD) WITH PROPOFOL  N/A 10/11/2015   Procedure: ESOPHAGOGASTRODUODENOSCOPY (EGD) WITH PROPOFOL ;  Surgeon: Lamar DASEN Viktoria, MD;  Location: [facility] ENDOSCOPY;  Service: Endoscopy;  Laterality: N/A;   ESOPHAGOGASTRODUODENOSCOPY (EGD) WITH PROPOFOL  N/A 12/10/2017   Procedure: ESOPHAGOGASTRODUODENOSCOPY (EGD) WITH PROPOFOL ;  Surgeon: Viktoria Lamar DASEN, MD;  Location: [facility] ENDOSCOPY;  Service: Endoscopy;  Laterality: N/A;   ESOPHAGOGASTRODUODENOSCOPY (EGD) WITH PROPOFOL  N/A 05/03/2022   Procedure: ESOPHAGOGASTRODUODENOSCOPY (EGD) WITH PROPOFOL ;  Surgeon: Maryruth Ole DASEN, MD;  Location: [facility]  ENDOSCOPY;  Service: Endoscopy;  Laterality: N/A;   JOINT REPLACEMENT Right    knee, Oct 2012   JOINT REPLACEMENT     hopefully getting left partial knee replacement in July 2016   JOINT REPLACEMENT Left    03/06/21   POLYPECTOMY  04/25/2023   Procedure: POLYPECTOMY;  Surgeon: Maryruth Ole DASEN, MD;  Location: [facility] ENDOSCOPY;  Service: Endoscopy;;   SAVORY DILATION N/A 10/11/2015   Procedure: HARLEY DILATION;  Surgeon: Lamar DASEN Holmes, MD;  Location[facility] ENDOSCOPY;  Service: Endoscopy;  Laterality: N/A;   SHOULDER ARTHROSCOPY WITH ROTATOR CUFF  REPAIR AND SUBACROMIAL DECOMPRESSION Right 06/14/2015   Procedure: SHOULDER ARTHROSCOPY WITH mini open ROTATOR CUFF REPAIR AND SUBACROMIAL DECOMPRESSION, release long head biceps tendon.;  Surgeon: Helayne Glenn, MD;  Location: [facility] ORS;  Service: Orthopedics;  Laterality: Right;   TOTAL KNEE ARTHROPLASTY Left 03/06/2021   Procedure: TOTAL KNEE ARTHROPLASTY - Medford Amber to Assist;  Surgeon: Kathlynn Sharper, MD;  Location: [facility] ORS;  Service: Orthopedics;  Laterality: Left;   TRIGGER FINGER RELEASE      Physical Exam   Triage Vital Signs: ED Triage Vitals  Encounter Vitals Group     BP 05/16/24 1800 134/79     Girls Systolic BP Percentile --      Girls Diastolic BP Percentile --      Boys Systolic BP Percentile --      Boys Diastolic BP Percentile --      Pulse Rate 05/16/24 1800 81     Resp 05/16/24 1800 18     Temp 05/16/24 1800 98 F (36.7 C)     Temp Source 05/16/24 1800 Oral     SpO2 05/16/24 1800 99 %     Weight 05/16/24 1757 178 lb 9.2 oz (81 kg)     Height 05/16/24 1757 5' 3 (1.6 m)     Head Circumference --      Peak Flow --      Pain Score 05/16/24 1757 0     Pain Loc --      Pain Education --      Exclude from Growth Chart --     Most recent vital signs: Vitals:   05/16/24 1930 05/16/24 2313  BP: (!) 144/89 121/76  Pulse: 95 80  Resp: (!) 26 16  Temp:  98 F (36.7 C)  SpO2: 100% 99%    General: Awake, no distress.  CV:  Good peripheral perfusion.  Regular rate rhythm.  Normal distal pulses Resp:  Normal effort.  Clear to auscultation bilaterally Abd:  No distention.  Soft nontender Other:  Dry oral mucosa   ED Results / Procedures / Treatments   Labs (all labs ordered are listed, but only abnormal results are displayed) Labs Reviewed  CBC - Abnormal; Notable for the following components:      Result Value   WBC 3.6 (*)    All other components within normal limits  URINALYSIS, ROUTINE W REFLEX MICROSCOPIC - Abnormal; Notable for the following  components:   Color, Urine AMBER (*)    APPearance HAZY (*)    Ketones, ur 5 (*)    Protein, ur 30 (*)    Nitrite POSITIVE (*)    Leukocytes,Ua LARGE (*)    Bacteria, UA MANY (*)    All other components within normal limits  COMPREHENSIVE METABOLIC PANEL WITH GFR - Abnormal; Notable for the following components:   Chloride 112 (*)    Creatinine, Ser 1.15 (*)    Albumin 3.3 (*)  GFR, Estimated 51 (*)    All other components within normal limits  CBG MONITORING, ED - Abnormal; Notable for the following components:   Glucose-Capillary 55 (*)    All other components within normal limits  CBG MONITORING, ED - Abnormal; Notable for the following components:   Glucose-Capillary 111 (*)    All other components within normal limits  URINE CULTURE  TSH  CBG MONITORING, ED     EKG Interpreted by me Sinus rhythm rate of 86.  Normal axis, normal intervals.  Normal QRS ST segments T waves.   RADIOLOGY    PROCEDURES:  Procedures   MEDICATIONS ORDERED IN ED: Medications  sodium chloride  0.9 % bolus 1,000 mL (0 mLs Intravenous Stopped 05/16/24 1917)  ondansetron  (ZOFRAN ) injection 4 mg (4 mg Intravenous Given 05/16/24 1805)  cephALEXin  (KEFLEX ) capsule 500 mg (500 mg Oral Given 05/16/24 2304)     IMPRESSION / MDM / ASSESSMENT AND PLAN / ED COURSE  I reviewed the triage vital signs and the nursing notes.  DDx: Dehydration, vasovagal episode, anemia, AKI, electrolyte derangement, UTI  Patient's presentation is most consistent with acute presentation with potential threat to life or bodily function.  Patient presents with near syncope episode.  Doubt ACS PE dissection arrhythmia stroke intracranial hemorrhage or sepsis/shock.  Exam and vital signs are normal/reassuring.  Will check labs, give IV fluids   Clinical Course as of 05/16/24 2318  [place] May 16, 2024  2317 Feels much better after IV fluids.  Eating normally.  Orthostatics normal.  Repeat CBG normal.  With her UTI and  dehydration symptoms earlier, engage in shared decision making and offered hospitalization, she feels comfortable with outpatient management for now.  No signs of pyelonephritis or severe infection, normal renal function.  Stable for discharge. [PS]    Clinical Course User Index [PS] Viviann Pastor, MD     FINAL CLINICAL IMPRESSION(S) / ED DIAGNOSES   Final diagnoses:  Cystitis  Near syncope  Dehydration     Rx / DC Orders   ED Discharge Orders          Ordered    cephALEXin  (KEFLEX ) 500 MG capsule  2 times daily        05/16/24 2316             Note:  This document was prepared using Dragon voice recognition software and may include unintentional dictation errors.   Viviann Pastor, MD 05/16/24 [phone]

## 2024-05-16 NOTE — ED Triage Notes (Signed)
 Pt BIB [facility] from work at [employer]. Started work at [employer]. Pt is a Development worker, community, was standing, felt light headed/dizzy and started having hot flashes. EMS reports no loc, pt did not faint. Pt Aox4.  Vitals 71 cbg, 140/73, 93 HR, 100% RA, NSR

## 2024-05-17 NOTE — Progress Notes (Signed)
 This encounter was created in error - please disregard.

## 2024-05-19 LAB — URINE CULTURE: Culture: >=100000 — AB

## 2024-05-30 NOTE — Progress Notes (Unsigned)
 PROVIDER NOTE: Interpretation of information contained herein should be left to medically-trained personnel. Specific patient instructions are provided elsewhere under Patient Instructions section of medical record. This document was created in part using AI and STT-dictation technology, any transcriptional errors that may result from this process are unintentional.  Patient: Jacqueline Delgado Shoulder  Service: E/M   PCP: Dineen Rollene MATSU, FNP  DOB: 1950-11-27  DOS: 05/31/2024  Provider: Eric DELENA Como, MD  MRN: [id number]  Delivery: Face-to-face  Specialty: Interventional Pain Management  Type: Established Patient  Setting: Ambulatory outpatient facility  Specialty designation: 09  Referring Prov.: Dineen Rollene MATSU, FNP  Location: Outpatient office facility       History of present illness (HPI) [name] Shoulder, a 73 y.o. year old female, is here today because of her Chronic bilateral low back pain without sciatica [M54.50, G89.29]. Ms. Nawrot primary complain today is No chief complaint on file.  Pertinent problems: Ms. Bossi has Localized swelling of finger of left hand; Acquired trigger finger; Closed nondisplaced fracture of base of fifth metacarpal bone of right hand; DDD (degenerative disc disease), cervical; DDD (degenerative disc disease), lumbar; Chronic low back pain (1ry area of Pain) (Bilateral) (R>L) w/ sciatica (Right); Neck pain; Non-cardiac chest pain; S/P rotator cuff repair; Unspecified osteoarthritis, unspecified site; Osteoarthritis of knee (Left); Ankle swelling, right; Chronic pain syndrome; DDD (degenerative disc disease), lumbosacral; Lumbar facet hypertrophy; Abnormal MRI, lumbar spine (08/11/20); Lumbosacral central spinal stenosis (9 mm) (L4-5 and L5-S1); Chronic lower extremity pain (Right); Lumbosacral radiculopathy at L5 (Right); Chronic knee pain (3ry area of Pain) (Left); Swelling of foot (4th area of Pain) (Right); Chronic lower extremity pain (2ry area of Pain)  (Bilateral) (R>L); Calcaneal spur of foot (Right); Lumbar Grade 1 Anterolisthesis of L4/L5; Lumbosacral facet syndrome (Bilateral) (R>L); Chronic low back pain (Bilateral) (R>L) w/o sciatica; Spondylosis without myelopathy or radiculopathy, lumbosacral region; S/P TKR (total knee replacement) using cement, left; Chronic knee pain after total replacement of knee joint (Left); Osteoarthritis of lumbar spine; Osteoarthritis involving multiple joints; Acute midline low back pain without sciatica; Chronic shoulder pain (Multifactorial) (Left); Abnormal MRI, cervical spine (07/25/2022); Cervicalgia; Cervical foraminal stenosis (Severe, Left: C3-4, C4-5) (Bilateral: C5-6, C6-7); Radicular pain of shoulder (Left); Cervical radiculopathy (Left) (C4, C5); Osteoarthritis of shoulder (Left); Osteoarthritis of AC (acromioclavicular) joint (Left); Arthralgia of acromioclavicular joint (Left); Upper arm pain (Left); Subacromial bursitis of shoulder joint (Left); Subdeltoid bursitis of shoulder joint (Left); Subscapularis tendinosis (Left); Supraspinatus tendinosis (Left); Lumbar facet joint pain; Complete tear of rotator cuff (Right); Generalized anxiety disorder; Neck mass; Headache; Low back pain of over 3 months duration; Intermittent low back pain; and Multifactorial low back pain on their pertinent problem list.  Pain Assessment: Severity of   is reported as a  /10. Location:    / . Onset:  . Quality:  . Timing:  . Modifying factor(s):  SABRA Vitals:  vitals were not taken for this visit.  BMI: Estimated body mass index is 31.63 kg/m as calculated from the following:   Height as of 05/16/24: 5' 3 (1.6 m).   Weight as of 05/16/24: 178 lb 9.2 oz (81 kg).  Last encounter: 04/07/2024. Last procedure: 04/13/2024.  Reason for encounter: post-procedure evaluation and assessment.   Discussed the use of AI scribe software for clinical note transcription with the patient, who gave verbal consent to proceed.  History of Present  Illness          Post-Procedure Evaluation   Type: Lumbar Facet, Medial Branch Block(s)   #  5  Laterality: Bilateral  Level: L2, L3, L4, L5, and S1 Medial Branch/Dorsal Rami Level(s). Injecting these levels blocks the L3-4, L4-5, and L5-S1 lumbar facet joints. Imaging: Fluoroscopic guidance Spinal ([id number]) Anesthesia: Local anesthesia (1-2% Lidocaine ) Anxiolysis: IV Versed  3.0 mg Sedation: Moderate Sedation Fentanyl  1 mL (50 mcg) DOS: 04/13/2024 Performed by: Eric DELENA Como, MD  Primary Purpose: Diagnostic/Therapeutic Indications: Low back pain severe enough to impact quality of life or function. 1. Chronic low back pain (Bilateral) (R>L) w/o sciatica   2. Lumbar Grade 1 Anterolisthesis of L4/L5   3. Lumbosacral facet syndrome (Bilateral) (R>L)   4. Osteoarthritis of lumbar spine   5. Spondylosis without myelopathy or radiculopathy, lumbosacral region   6. Lumbar facet joint pain   7. Lumbar facet hypertrophy   8. Low back pain of over 3 months duration   9. Intermittent low back pain   10. Multifactorial low back pain    NAS-11 Pain score:   Pre-procedure: 4 /10   Post-procedure: 0-No pain/10     Effectiveness:  Initial hour after procedure:   ***. Subsequent 4-6 hours post-procedure:   ***. Analgesia past initial 6 hours:   ***. Ongoing improvement:  Analgesic:  *** Function:    ***    ROM:    ***     Pharmacotherapy Assessment   Opioid Analgesic(s):  Analgesic: No chronic opioid analgesics therapy prescribed by our practice. Tramadol  50 mg tablet, 1 tab p.o. twice daily (PRN) MME/day: 0-10 mg/day   Monitoring: [place] PMP: PDMP reviewed during this encounter.       Pharmacotherapy: No side-effects or adverse reactions reported. Compliance: No problems identified. Effectiveness: Clinically acceptable.  No notes on file  UDS:  Summary  Date Value Ref Range Status  05/22/2020 Note  Final    Comment:     ==================================================================== Compliance Drug Analysis, Ur ==================================================================== Test                             Result       Flag       Units  Drug Present and Declared for Prescription Verification   Tramadol                        >3268        EXPECTED   ng/mg creat   O-Desmethyltramadol            2372         EXPECTED   ng/mg creat   N-Desmethyltramadol            1509         EXPECTED   ng/mg creat    Source of tramadol  is a prescription medication. O-desmethyltramadol    and N-desmethyltramadol are expected metabolites of tramadol .    Bupropion                       PRESENT      EXPECTED   Hydroxybupropion               PRESENT      EXPECTED    Hydroxybupropion is an expected metabolite of bupropion .  Drug Present not Declared for Prescription Verification   Dextromethorphan                PRESENT      UNEXPECTED   Dextrorphan/Levorphanol        PRESENT      UNEXPECTED  Dextrorphan is an expected metabolite of dextromethorphan , an over-    the-counter or prescription cough suppressant. Dextrorphan cannot be    distinguished from the scheduled prescription medication levorphanol    by the method used for analysis.  ==================================================================== Test                      Result    Flag   Units      Ref Range   Creatinine              153              mg/dL      >=79 ==================================================================== Declared Medications:  The flagging and interpretation on this report are based on the  following declared medications.  Unexpected results may arise from  inaccuracies in the declared medications.   **Note: The testing scope of this panel includes these medications:   Bupropion  (Wellbutrin )  Tramadol  (Ultram )   **Note: The testing scope of this panel does not include the  following reported medications:   Famotidine   (Pepcid )  Hydrochlorothiazide  (Hydrodiuril )  Losartan  (Cozaar )  Meloxicam (Mobic)  Mirabegron  (Myrbetriq )  Rabeprazole  (Aciphex ) ==================================================================== For clinical consultation, please call [phone]. ====================================================================     No results found for: CBDTHCR No results found for: D8THCCBX No results found for: D9THCCBX  ROS  Constitutional: Denies any fever or chills Gastrointestinal: No reported hemesis, hematochezia, vomiting, or acute GI distress Musculoskeletal: Denies any acute onset joint swelling, redness, loss of ROM, or weakness Neurological: No reported episodes of acute onset apraxia, aphasia, dysarthria, agnosia, amnesia, paralysis, loss of coordination, or loss of consciousness  Medication Review  RABEprazole , cephALEXin , famotidine , fluticasone , hydrOXYzine , losartan , ondansetron , and tirzepatide   History Review  Allergy: Ms. Narine has no known allergies. Drug: Ms. Mcgann  reports no history of drug use. Alcohol:  reports no history of alcohol use. Tobacco:  reports that she has never smoked. She has never used smokeless tobacco. Social: Ms. Kosinski  reports that she has never smoked. She has never used smokeless tobacco. She reports that she does not drink alcohol and does not use drugs. Medical:  has a past medical history of Anemia, Anginal pain (HCC), Arthritis, Chronic pain syndrome, Colon polyp, Depression, GERD (gastroesophageal reflux disease), Hiatal hernia, History of hiatal hernia, History of methicillin resistant staphylococcus aureus (MRSA), Hypertension, Obesity, Pre-diabetes, Reflux, Sleep apnea, and Small vessel disease (HCC). Surgical: Ms. Dyk  has a past surgical history that includes Joint replacement (Right); Joint replacement; Cholecystectomy; Abdominal hysterectomy; Shoulder arthroscopy with rotator cuff repair and subacromial decompression  (Right, 06/14/2015); Esophagogastroduodenoscopy (egd) with propofol  (N/A, 10/11/2015); Savory dilation (N/A, 10/11/2015); Colonoscopy w/ polypectomy; Esophagogastroduodenoscopy (egd) with propofol  (N/A, 12/10/2017); Colonoscopy with propofol  (N/A, 12/10/2017); Total knee arthroplasty (Left, 03/06/2021); Joint replacement (Left); Esophagogastroduodenoscopy (egd) with propofol  (N/A, 05/03/2022); Breast surgery; Carpal tunnel release; Trigger finger release; Colonoscopy with propofol  (N/A, 04/25/2023); and polypectomy (04/25/2023). Family: family history includes Alcoholism in an other family member; Aneurysm (age of onset: 34) in her sister; Arthritis in an other family member; Breast cancer in her maternal grandmother; Breast cancer (age of onset: 100) in her mother; Colon cancer (age of onset: 69) in her sister; Diabetes in an other family member; Heart disease in her father; Hyperlipidemia in her father; Hypertension in her father; Stroke in her father.  Laboratory Chemistry Profile   Renal Lab Results  Component Value Date   BUN 18 05/16/2024   CREATININE 1.15 (H) 05/16/2024   BCR 12 07/28/2023  GFR 72.71 03/01/2024   GFRAA 81 09/13/2020   GFRNONAA 51 (L) 05/16/2024    Hepatic Lab Results  Component Value Date   AST 20 05/16/2024   ALT 10 05/16/2024   ALBUMIN 3.3 (L) 05/16/2024   ALKPHOS 82 05/16/2024   LIPASE 36 10/12/2017    Electrolytes Lab Results  Component Value Date   NA 143 05/16/2024   K 3.7 05/16/2024   CL 112 (H) 05/16/2024   CALCIUM 9.1 05/16/2024   MG 2.1 04/09/2023    Bone Lab Results  Component Value Date   VD25OH 21.08 (L) 01/29/2023   25OHVITD1 18 (L) 05/22/2020   25OHVITD2 13 05/22/2020   25OHVITD3 5.3 05/22/2020    Inflammation (CRP: Acute Phase) (ESR: Chronic Phase) Lab Results  Component Value Date   CRP 15 (H) 05/22/2020   ESRSEDRATE 73 (H) 05/22/2020         Note: Above Lab results reviewed.  Recent Imaging Review  CT ABDOMEN PELVIS WO  CONTRAST CLINICAL DATA:  Lightheadedness, dizziness, abdominal pain  EXAM: CT ABDOMEN AND PELVIS WITHOUT CONTRAST  TECHNIQUE: Multidetector CT imaging of the abdomen and pelvis was performed following the standard protocol without IV contrast.  RADIATION DOSE REDUCTION: This exam was performed according to the departmental dose-optimization program which includes automated exposure control, adjustment of the mA and/or kV according to patient size and/or use of iterative reconstruction technique.  COMPARISON:  10/12/2017  FINDINGS: Lower chest: No acute pleural or parenchymal lung disease.  Hepatobiliary: Cholecystectomy. Subcentimeter right lobe liver cyst unchanged. Otherwise unremarkable unenhanced appearance of the liver.  Pancreas: Unremarkable unenhanced appearance.  Spleen: Unremarkable unenhanced appearance.  Adrenals/Urinary Tract: No urinary tract calculi or obstructive uropathy. The adrenals are unremarkable. Bladder is decompressed, limiting its evaluation.  Stomach/Bowel: No bowel obstruction or ileus. Normal appendix right lower quadrant. Diverticulosis of the sigmoid colon with no evidence of acute diverticulitis. No bowel wall thickening or inflammatory change.  Vascular/Lymphatic: Aortic atherosclerosis. No enlarged abdominal or pelvic lymph nodes.  Reproductive: Status post hysterectomy. No adnexal masses.  Other: No free fluid or free intraperitoneal gas. No abdominal wall hernia.  Musculoskeletal: No acute or destructive bony abnormalities. Reconstructed images demonstrate no additional findings.  IMPRESSION: 1. No acute intra-abdominal or intrapelvic process. 2. Sigmoid diverticulosis without evidence of acute diverticulitis. 3.  Aortic Atherosclerosis (ICD10-I70.0).  Electronically Signed   By: Ozell Daring M.D.   On: 05/16/2024 20:59 Note: Reviewed        Physical Exam  Vitals: There were no vitals taken for this visit. BMI:  Estimated body mass index is 31.63 kg/m as calculated from the following:   Height as of 05/16/24: 5' 3 (1.6 m).   Weight as of 05/16/24: 178 lb 9.2 oz (81 kg). Ideal: Patient weight not recorded General appearance: Well nourished, well developed, and well hydrated. In no apparent acute distress Mental status: Alert, oriented x 3 (person, place, & time)       Respiratory: No evidence of acute respiratory distress Eyes: PERLA   Assessment   Diagnosis Status  1. Chronic low back pain (Bilateral) (R>L) w/o sciatica   2. Lumbosacral facet syndrome (Bilateral) (R>L)   3. Osteoarthritis of lumbar spine   4. Lumbar facet joint pain   5. Low back pain of over 3 months duration   6. Intermittent low back pain   7. Multifactorial low back pain   8. Postop check    Controlled Controlled Controlled   Updated Problems: No problems updated.  Plan of Care  Problem-specific:  Assessment and Plan            Ms. EUSTOLIA DRENNEN has a current medication list which includes the following long-term medication(s): aciphex , fluticasone , and losartan .  Pharmacotherapy (Medications Ordered): No orders of the defined types were placed in this encounter.  Orders:  No orders of the defined types were placed in this encounter.    Interventional Therapies  Risk Factors  Considerations:  Cerebrovascular malformation  GERD  HTN MO  OSA  SOB No RFA until BMI is below 35 kg/m.   Planned  Pending:   Therapeutic/palliative bilateral lumbar facet MBB #5 (first of 2025)   Under consideration:   Therapeutic/palliative bilateral lumbar facet MBB #5  Therapeutic left CESI #2    Completed:   Diagnostic/therapeutic left IA glenohumeral + AC joint inj. + subacromial bursa inj. x1 (01/09/2023) (100/100/70/70 x 4 months) Therapeutic left cervical ESI x1 (11/26/2022) (75/75/50/50) (Neck: 100  (L) shoulder: 50)  Therapeutic/palliative bilateral lumbar facet MBB x4 (06/19/2023) (100/100/100/100 x 6  months)  Therapeutic right L4-5 LESI x1 (07/27/2020)  Referral to bariatric surgery and medical weight management entered on 07/18/2020.    Completed by other providers:   EEG x2 by Dr. Jannett Fairly (07/03/14, 07/29/14) - WNL  Diagnostic left occipital nerve block x1 (08/10/2014) by Dr. Jannett Fairly [facility] neurology)  Therapeutic right rotator cuff repair x1 (06/14/2015) by HILARIO WENDI Glenn MD [facility] orthopedics)  Therapeutic left IA Synvisc knee injection x2 (10/02/2020) by Morene Redell Sharps, PA [facility] [place] [facility] and [facility]) Therapeutic left total knee replacement x1 (03/06/2021) by Dr. Ozell Flake [facility] orthopedics)   Therapeutic  Palliative (PRN) options:   Palliative bilateral lumbar facet MBB #4  Therapeutic right L4-5 LESI #2      No follow-ups on file.    Recent Visits Date Type Provider Dept  04/13/24 Procedure visit Tanya Glisson, MD [facility]  04/07/24 Office Visit Tanya Glisson, MD [facility]  Showing recent visits within past 90 days and meeting all other requirements Future Appointments Date Type Provider Dept  05/31/24 Appointment Tanya Glisson, MD [facility]  Showing future appointments within next 90 days and meeting all other requirements  I discussed the assessment and treatment plan with the patient. The patient was provided an opportunity to ask questions and all were answered. The patient agreed with the plan and demonstrated an understanding of the instructions.  Patient advised to call back or seek an in-person evaluation if the symptoms or condition worsens.  Duration of encounter: *** minutes.  Total time on encounter, as per AMA guidelines included both the face-to-face and non-face-to-face time personally spent by the physician and/or other qualified health care professional(s) on the day of the encounter (includes time in activities that require the physician or other qualified health care professional and does not  include time in activities normally performed by clinical staff). Physician's time may include the following activities when performed: Preparing to see the patient (e.g., pre-charting review of records, searching for previously ordered imaging, lab work, and nerve conduction tests) Review of prior analgesic pharmacotherapies. Reviewing PMP Interpreting ordered tests (e.g., lab work, imaging, nerve conduction tests) Performing post-procedure evaluations, including interpretation of diagnostic procedures Obtaining and/or reviewing separately obtained history Performing a medically appropriate examination and/or evaluation Counseling and educating the patient/family/caregiver Ordering medications, tests, or procedures Referring and communicating with other health care professionals (when not separately reported) Documenting clinical information in the electronic or other health record Independently interpreting results (not separately  reported) and communicating results to the patient/ family/caregiver Care coordination (not separately reported)  Note by: Eric DELENA Como, MD (TTS and AI technology used. I apologize for any typographical errors that were not detected and corrected.) Date: 05/31/2024; Time: 7:26 PM

## 2024-05-31 ENCOUNTER — Encounter: Payer: Self-pay | Admitting: Pain Medicine

## 2024-05-31 ENCOUNTER — Ambulatory Visit: Attending: Pain Medicine | Admitting: Pain Medicine

## 2024-05-31 VITALS — BP 127/81 | HR 71 | Temp 98.1°F | Resp 16 | Ht 63.0 in | Wt 175.0 lb

## 2024-05-31 DIAGNOSIS — M5459 Other low back pain: Secondary | ICD-10-CM | POA: Diagnosis not present

## 2024-05-31 DIAGNOSIS — M47817 Spondylosis without myelopathy or radiculopathy, lumbosacral region: Secondary | ICD-10-CM | POA: Diagnosis not present

## 2024-05-31 DIAGNOSIS — N898 Other specified noninflammatory disorders of vagina: Secondary | ICD-10-CM | POA: Insufficient documentation

## 2024-05-31 DIAGNOSIS — Z09 Encounter for follow-up examination after completed treatment for conditions other than malignant neoplasm: Secondary | ICD-10-CM | POA: Insufficient documentation

## 2024-05-31 DIAGNOSIS — B3731 Acute candidiasis of vulva and vagina: Secondary | ICD-10-CM | POA: Diagnosis present

## 2024-05-31 DIAGNOSIS — M545 Low back pain, unspecified: Secondary | ICD-10-CM | POA: Diagnosis not present

## 2024-05-31 DIAGNOSIS — G8929 Other chronic pain: Secondary | ICD-10-CM | POA: Insufficient documentation

## 2024-05-31 DIAGNOSIS — M47816 Spondylosis without myelopathy or radiculopathy, lumbar region: Secondary | ICD-10-CM | POA: Diagnosis not present

## 2024-05-31 MED ORDER — FLUCONAZOLE 150 MG PO TABS: 150.0000 mg | ORAL_TABLET | Freq: Once | ORAL | 0 refills | Status: DC

## 2024-05-31 NOTE — Patient Instructions (Signed)

## 2024-05-31 NOTE — Progress Notes (Signed)
 Safety precautions to be maintained throughout the outpatient stay will include: orient to surroundings, keep bed in low position, maintain call bell within reach at all times, provide assistance with transfer out of bed and ambulation.

## 2024-06-17 ENCOUNTER — Encounter: Payer: Self-pay | Admitting: Pain Medicine

## 2024-06-17 ENCOUNTER — Ambulatory Visit (HOSPITAL_BASED_OUTPATIENT_CLINIC_OR_DEPARTMENT_OTHER): Admitting: Pain Medicine

## 2024-06-17 ENCOUNTER — Ambulatory Visit
Admission: RE | Admit: 2024-06-17 | Discharge: 2024-06-17 | Disposition: A | Discharge status: Home / Self Care | Source: Ambulatory Visit | Attending: Pain Medicine | Admitting: Pain Medicine

## 2024-06-17 VITALS — BP 163/90 | HR 80 | Temp 97.3°F | Resp 15 | Ht 63.0 in | Wt 173.0 lb

## 2024-06-17 DIAGNOSIS — M47817 Spondylosis without myelopathy or radiculopathy, lumbosacral region: Secondary | ICD-10-CM | POA: Diagnosis not present

## 2024-06-17 DIAGNOSIS — Z5189 Encounter for other specified aftercare: Secondary | ICD-10-CM

## 2024-06-17 DIAGNOSIS — M4310 Spondylolisthesis, site unspecified: Secondary | ICD-10-CM | POA: Diagnosis not present

## 2024-06-17 DIAGNOSIS — G8929 Other chronic pain: Secondary | ICD-10-CM

## 2024-06-17 DIAGNOSIS — M5459 Other low back pain: Secondary | ICD-10-CM | POA: Insufficient documentation

## 2024-06-17 DIAGNOSIS — M47816 Spondylosis without myelopathy or radiculopathy, lumbar region: Secondary | ICD-10-CM

## 2024-06-17 DIAGNOSIS — G8918 Other acute postprocedural pain: Secondary | ICD-10-CM

## 2024-06-17 DIAGNOSIS — M545 Low back pain, unspecified: Secondary | ICD-10-CM

## 2024-06-17 MED ORDER — FENTANYL CITRATE (PF) 100 MCG/2ML IJ SOLN
25.0000 ug | INTRAMUSCULAR | Status: AC | PRN
  Administered 2024-06-17 (×2): 25 ug via INTRAVENOUS
  Administered 2024-06-17: 50 ug via INTRAVENOUS

## 2024-06-17 MED ORDER — FENTANYL CITRATE (PF) 100 MCG/2ML IJ SOLN
INTRAMUSCULAR | Status: AC
Start: 2024-06-17 — End: 2024-06-17
  Filled 2024-06-17: qty 2

## 2024-06-17 MED ORDER — HYDROCODONE-ACETAMINOPHEN 5-325 MG PO TABS: 1.0000 | ORAL_TABLET | Freq: Four times a day (QID) | ORAL | 0 refills | Status: AC | PRN

## 2024-06-17 MED ORDER — TRIAMCINOLONE ACETONIDE 40 MG/ML IJ SUSP
40.0000 mg | Freq: Once | INTRAMUSCULAR | Status: AC
  Administered 2024-06-17: 40 mg

## 2024-06-17 MED ORDER — ROPIVACAINE HCL 2 MG/ML IJ SOLN
INTRAMUSCULAR | Status: AC
  Filled 2024-06-17: qty 20

## 2024-06-17 MED ORDER — ROPIVACAINE HCL 2 MG/ML IJ SOLN
9.0000 mL | Freq: Once | INTRAMUSCULAR | Status: AC
  Administered 2024-06-17: 9 mL via PERINEURAL

## 2024-06-17 MED ORDER — LIDOCAINE HCL 2 % IJ SOLN
INTRAMUSCULAR | Status: AC
  Filled 2024-06-17: qty 20

## 2024-06-17 MED ORDER — MIDAZOLAM HCL 5 MG/5ML IJ SOLN
INTRAMUSCULAR | Status: AC
  Filled 2024-06-17: qty 5

## 2024-06-17 MED ORDER — TRIAMCINOLONE ACETONIDE 40 MG/ML IJ SUSP
INTRAMUSCULAR | Status: AC
  Filled 2024-06-17: qty 1

## 2024-06-17 MED ORDER — LIDOCAINE HCL 2 % IJ SOLN
20.0000 mL | Freq: Once | INTRAMUSCULAR | Status: AC
  Administered 2024-06-17: 400 mg

## 2024-06-17 MED ORDER — PENTAFLUOROPROP-TETRAFLUOROETH EX AERO: INHALATION_SPRAY | Freq: Once | CUTANEOUS | Status: AC

## 2024-06-17 MED ORDER — MIDAZOLAM HCL 5 MG/5ML IJ SOLN
0.5000 mg | Freq: Once | INTRAMUSCULAR | Status: AC
  Administered 2024-06-17 (×2): 1 mg via INTRAVENOUS

## 2024-06-17 NOTE — Patient Instructions (Signed)
 ______________________________________________________________________    Post-Radiofrequency (RF) Discharge Instructions  You have just completed a Radiofrequency Neurotomy.  The following instructions will provide you with information and guidelines for self-care upon discharge.  If at any time you have questions or concerns please call your physician. DO NOT DRIVE YOURSELF!!  Instructions: Apply ice: Fill a plastic sandwich bag with crushed ice. Cover it with a small towel and apply to injection site. Apply for 15 minutes then remove x 15 minutes. Repeat sequence on day of procedure, until you go to bed. The purpose is to minimize swelling and discomfort after procedure. Apply heat: Apply heat to procedure site starting the day following the procedure. The purpose is to treat any soreness and discomfort from the procedure. Food intake: No eating limitations, unless stipulated above.  Nevertheless, if you have had sedation, you may experience some nausea.  In this case, it may be wise to wait at least two hours prior to resuming regular diet. Physical activities: Keep activities to a minimum for the first 8 hours after the procedure. For the first 24 hours after the procedure, do not drive a motor vehicle,  Operate heavy machinery, power tools, or handle any weapons.  Consider walking with the use of an assistive device or accompanied by an adult for the first 24 hours.  Do not drink alcoholic beverages including beer.  Do not make any important decisions or sign any legal documents. Go home and rest today.  Resume activities tomorrow, as tolerated.  Use caution in moving about as you may experience mild leg weakness.  Use caution in cooking, use of household electrical appliances and climbing steps. Driving: If you have received any sedation, you are not allowed to drive for 24 hours after your procedure. Blood thinner: Restart your blood thinner 6 hours after your procedure. (Only for those taking  blood thinners) Insulin: As soon as you can eat, you may resume your normal dosing schedule. (Only for those taking insulin) Medications: May resume pre-procedure medications.  Do not take any drugs, other than what has been prescribed to you. Infection prevention: Keep procedure site clean and dry. Post-procedure Pain Diary: Extremely important that this be done correctly and accurately. Recorded information will be used to determine the next step in treatment. Pain evaluated is that of treated area only. Do not include pain from an untreated area. Complete every hour, on the hour, for the initial 8 hours. Set an alarm to help you do this part accurately. Do not go to sleep and have it completed later. It will not be accurate. Follow-up appointment: Keep your follow-up appointment after the procedure. Usually 2-6 weeks after radiofrequency. Bring you pain diary. The information collected will be essential for your long-term care.   Expect: From numbing medicine (AKA: Local Anesthetics): Numbness or decrease in pain. Onset: Full effect within 15 minutes of injected. Duration: It will depend on the type of local anesthetic used. On the average, 1 to 8 hours.  From steroids (when added): Decrease in swelling or inflammation. Once inflammation is improved, relief of the pain will follow. Onset of benefits: Depends on the amount of swelling present. The more swelling, the longer it will take for the benefits to be seen. In some cases, up to 10 days. Duration: Steroids will stay in the system x 2 weeks. Duration of benefits will depend on multiple posibilities including persistent irritating factors. From procedure: Some discomfort is to be expected once the numbing medicine wears off. In the case of  radiofrequency procedures, this may last as long as 6-8 weeks. Additional post-procedure pain medication is provided for this. Discomfort is minimized if ice and heat are applied as instructed.  Call  if: You experience numbness and weakness that gets worse with time, as opposed to wearing off. He experience any unusual bleeding, difficulty breathing, or loss of the ability to control your bowel and bladder. (This applies to Spinal procedures only) You experience any redness, swelling, heat, red streaks, elevated temperature, fever, or any other signs of a possible infection.  Emergency Numbers: Durning business hours (Monday - Thursday, 8:00 AM - 4:00 PM) (Friday, 9:00 AM - 12:00 Noon): [phone] After hours: [phone] ______________________________________________________________________     ______________________________________________________________________    Procedure instructions  Stop blood-thinners  Do not eat or drink fluids (other than water) for 6 hours before your procedure  No water for 2 hours before your procedure  Take your blood pressure medicine with a sip of water  Arrive 30 minutes before your appointment  If sedation is planned, bring suitable driver. [place], [place], & public transportation are NOT APPROVED)  Carefully read the Preparing for your procedure detailed instructions  If you have questions call us  at [phone]  Procedure appointments are for procedures only.   NO medication refills or new problem evaluations will be done on procedure days.   Only the scheduled, pre-approved procedure and side will be done.   ______________________________________________________________________      ______________________________________________________________________    Preparing for your procedure  Appointments: If you think you may not be able to keep your appointment, call 24-48 hours in advance to cancel. We need time to make it available to others.  Procedure visits are for procedures only. During your procedure appointment there will be: NO Prescription Refills*. NO medication changes or discussions*. NO discussion of  disability issues*. NO unrelated pain problem evaluations*. NO evaluations to order other pain procedures*. *These will be addressed at a separate and distinct evaluation encounter on the provider's evaluation schedule and not during procedure days.  Instructions: Food intake: Avoid eating anything solid for at least 8 hours prior to your procedure. Clear liquid intake: You may take clear liquids such as water up to 2 hours prior to your procedure. (No carbonated drinks. No soda.) Transportation: Unless otherwise stated by your physician, bring a driver. (Driver cannot be a Market researcher, Pharmacist, community, or any other form of public transportation.) Morning Medicines: Except for blood thinners, take all of your other morning medications with a sip of water. Make sure to take your heart and blood pressure medicines. If your blood pressure's lower number is above 100, the case will be rescheduled. Blood thinners: Make sure to stop your blood thinners as instructed.  If you take a blood thinner, but were not instructed to stop it, call our office [phone] and ask to talk to a nurse. Not stopping a blood thinner prior to certain procedures could lead to serious complications. Diabetics on insulin: Notify the staff so that you can be scheduled 1st case in the morning. If your diabetes requires high dose insulin, take only  of your normal insulin dose the morning of the procedure and notify the staff that you have done so. Preventing infections: Shower with an antibacterial soap the morning of your procedure.  Build-up your immune system: Take 1000 mg of Vitamin C with every meal (3 times a day) the day prior to your procedure. Antibiotics: Inform the nursing staff if you are taking any  antibiotics or if you have any conditions that may require antibiotics prior to procedures. (Example: recent joint implants)   Pregnancy: If you are pregnant make sure to notify the nursing staff. Not doing so may result in injury to the  fetus, including death.  Sickness: If you have a cold, fever, or any active infections, call and cancel or reschedule your procedure. Receiving steroids while having an infection may result in complications. Arrival: You must be in the facility at least 30 minutes prior to your scheduled procedure. Tardiness: Your scheduled time is also the cutoff time. If you do not arrive at least 15 minutes prior to your procedure, you will be rescheduled.  Children: Do not bring any children with you. Make arrangements to keep them home. Dress appropriately: There is always a possibility that your clothing may get soiled. Avoid long dresses. Valuables: Do not bring any jewelry or valuables.  Reasons to call and reschedule or cancel your procedure: (Following these recommendations will minimize the risk of a serious complication.) Surgeries: Avoid having procedures within 2 weeks of any surgery. (Avoid for 2 weeks before or after any surgery). Flu Shots: Avoid having procedures within 2 weeks of a flu shots or . (Avoid for 2 weeks before or after immunizations). Barium: Avoid having a procedure within 7-10 days after having had a radiological study involving the use of radiological contrast. (Myelograms, Barium swallow or enema study). Heart attacks: Avoid any elective procedures or surgeries for the initial 6 months after a Myocardial Infarction (Heart Attack). Blood thinners: It is imperative that you stop these medications before procedures. Let us  know if you if you take any blood thinner.  Infection: Avoid procedures during or within two weeks of an infection (including chest colds or gastrointestinal problems). Symptoms associated with infections include: Localized redness, fever, chills, night sweats or profuse sweating, burning sensation when voiding, cough, congestion, stuffiness, runny nose, sore throat, diarrhea, nausea, vomiting, cold or Flu symptoms, recent or current infections. It is specially  important if the infection is over the area that we intend to treat. Heart and lung problems: Symptoms that may suggest an active cardiopulmonary problem include: cough, chest pain, breathing difficulties or shortness of breath, dizziness, ankle swelling, uncontrolled high or unusually low blood pressure, and/or palpitations. If you are experiencing any of these symptoms, cancel your procedure and contact your primary care physician for an evaluation.  Remember:  Regular Business hours are:  Monday to Thursday 8:00 AM to 4:00 PM  Provider's Schedule: Eric Como, MD:  Procedure days: Tuesday and Thursday 7:30 AM to 4:00 PM  Wallie Sherry, MD:  Procedure days: Monday and Wednesday 7:30 AM to 4:00 PM Last  Updated: 09/23/2023 ______________________________________________________________________      ______________________________________________________________________    General Risks and Possible Complications  Patient Responsibilities: It is important that you read this as it is part of your informed consent. It is our duty to inform you of the risks and possible complications associated with treatments offered to you. It is your responsibility as a patient to read this and to ask questions about anything that is not clear or that you believe was not covered in this document.  Patient's Rights: You have the right to refuse treatment. You also have the right to change your mind, even after initially having agreed to have the treatment done. However, under this last option, if you wait until the last second to change your mind, you may be charged for the materials used up to that  point.  Introduction: Medicine is not an Visual merchandiser. Everything in Medicine, including the lack of treatment(s), carries the potential for danger, harm, or loss (which is by definition: Risk). In Medicine, a complication is a secondary problem, condition, or disease that can aggravate an already existing  one. All treatments carry the risk of possible complications. The fact that a side effects or complications occurs, does not imply that the treatment was conducted incorrectly. It must be clearly understood that these can happen even when everything is done following the highest safety standards.  No treatment: You can choose not to proceed with the proposed treatment alternative. The "PRO(s)" would include: avoiding the risk of complications associated with the therapy. The "CON(s)" would include: not getting any of the treatment benefits. These benefits fall under one of three categories: diagnostic; therapeutic; and/or palliative. Diagnostic benefits include: getting information which can ultimately lead to improvement of the disease or symptom(s). Therapeutic benefits are those associated with the successful treatment of the disease. Finally, palliative benefits are those related to the decrease of the primary symptoms, without necessarily curing the condition (example: decreasing the pain from a flare-up of a chronic condition, such as incurable terminal cancer).  General Risks and Complications: These are associated to most interventional treatments. They can occur alone, or in combination. They fall under one of the following six (6) categories: no benefit or worsening of symptoms; bleeding; infection; nerve damage; allergic reactions; and/or death. No benefits or worsening of symptoms: In Medicine there are no guarantees, only probabilities. No healthcare provider can ever guarantee that a medical treatment will work, they can only state the probability that it may. Furthermore, there is always the possibility that the condition may worsen, either directly, or indirectly, as a consequence of the treatment. Bleeding: This is more common if the patient is taking a blood thinner, either prescription or over the counter (example: Goody Powders, Fish oil, Aspirin, Garlic, etc.), or if suffering a condition  associated with impaired coagulation (example: Hemophilia, cirrhosis of the liver, low platelet counts, etc.). However, even if you do not have one on these, it can still happen. If you have any of these conditions, or take one of these drugs, make sure to notify your treating physician. Infection: This is more common in patients with a compromised immune system, either due to disease (example: diabetes, cancer, human immunodeficiency virus [HIV], etc.), or due to medications or treatments (example: therapies used to treat cancer and rheumatological diseases). However, even if you do not have one on these, it can still happen. If you have any of these conditions, or take one of these drugs, make sure to notify your treating physician. Nerve Damage: This is more common when the treatment is an invasive one, but it can also happen with the use of medications, such as those used in the treatment of cancer. The damage can occur to small secondary nerves, or to large primary ones, such as those in the spinal cord and brain. This damage may be temporary or permanent and it may lead to impairments that can range from temporary numbness to permanent paralysis and/or brain death. Allergic Reactions: Any time a substance or material comes in contact with our body, there is the possibility of an allergic reaction. These can range from a mild skin rash (contact dermatitis) to a severe systemic reaction (anaphylactic reaction), which can result in death. Death: In general, any medical intervention can result in death, most of the time due to an unforeseen  complication. ______________________________________________________________________

## 2024-06-17 NOTE — Progress Notes (Signed)
 PROVIDER NOTE: Interpretation of information contained herein should be left to medically-trained personnel. Specific patient instructions are provided elsewhere under Patient Instructions section of medical record. This document was created in part using STT-dictation technology, any transcriptional errors that may result from this process are unintentional.  Patient: Jacqueline Delgado Type: Established DOB: [birth year] MRN: [id number] PCP: Dineen Rollene MATSU, FNP  Service: Procedure DOS: 06/17/2024 Setting: Ambulatory Location: Ambulatory outpatient facility Delivery: Face-to-face Provider: Eric DELENA Como, MD Specialty: Interventional Pain Management Specialty designation: 09 Location: Outpatient facility Ref. Prov.: Dineen Rollene MATSU, FNP       Interventional Therapy   Procedure: Lumbar Facet, Medial Branch Radiofrequency Ablation (RFA) #1  Laterality: Right (-RT)  Level: L2, L3, L4, L5, and S1 Medial Branch Level(s). These levels will denervate the L3-4, L4-5, and L5-S1 lumbar facet joints.  Imaging: Fluoroscopy-guided Spinal ([id number]) Anesthesia: Local anesthesia (1-2% Lidocaine ) Anxiolysis: IV Versed  3.0 mg Sedation: Minimal Sedation Fentanyl  2.0 mL (100 mcg) DOS: 06/17/2024  Performed by: Eric DELENA Como, MD  Purpose: Therapeutic/Palliative Indications: Low back pain severe enough to impact quality of life or function. Indications: 1. Chronic low back pain (Bilateral) (R>L) w/o sciatica   2. Low back pain of over 3 months duration   3. Lumbar facet joint pain   4. Lumbar facet hypertrophy   5. Lumbar Grade 1 Anterolisthesis of L4/L5   6. Lumbosacral facet syndrome (Bilateral) (R>L)   7. Intermittent low back pain   8. Multifactorial low back pain   9. Spondylosis without myelopathy or radiculopathy, lumbosacral region    [name] has been dealing with the above chronic pain for longer than three months and has either failed to respond, was unable to tolerate, or  simply did not get enough benefit from other more conservative therapies including, but not limited to: 1. Over-the-counter medications 2. Anti-inflammatory medications 3. Muscle relaxants 4. Membrane stabilizers 5. Opioids 6. Physical therapy and/or chiropractic manipulation 7. Modalities (Heat, ice, etc.) 8. Invasive techniques such as nerve blocks. Ms. Searls has attained more than 50% relief of the pain from a series of diagnostic injections conducted in separate occasions.  Pain Score: Pre-procedure: 5 /10 Post-procedure: 0-No pain/10     Position / Prep / Materials:  Position: Prone  Prep solution: ChloraPrep (2% chlorhexidine  gluconate and 70% isopropyl alcohol) Prep Area: Entire Lumbosacral Region (Lower back from mid-thoracic region to end of tailbone and from flank to flank.) Materials:  Tray: RFA (Radiofrequency) tray Needle(s):  Type: RFA (Teflon-coated radiofrequency ablation needles) Gauge (G): 20  Length: Long (15cm) Qty: 5     H&P (Pre-op Assessment):  Jacqueline Delgado is a 73 y.o. (year old), female patient, seen today for interventional treatment. She  has a past surgical history that includes Joint replacement (Right); Joint replacement; Cholecystectomy; Abdominal hysterectomy; Shoulder arthroscopy with rotator cuff repair and subacromial decompression (Right, 06/14/2015); Esophagogastroduodenoscopy (egd) with propofol  (N/A, 10/11/2015); Savory dilation (N/A, 10/11/2015); Colonoscopy w/ polypectomy; Esophagogastroduodenoscopy (egd) with propofol  (N/A, 12/10/2017); Colonoscopy with propofol  (N/A, 12/10/2017); Total knee arthroplasty (Left, 03/06/2021); Joint replacement (Left); Esophagogastroduodenoscopy (egd) with propofol  (N/A, 05/03/2022); Breast surgery; Carpal tunnel release; Trigger finger release; Colonoscopy with propofol  (N/A, 04/25/2023); and polypectomy (04/25/2023). Jacqueline Delgado has a current medication list which includes the following prescription(s): aciphex ,  famotidine , fluticasone , hydrocodone -acetaminophen , [START ON 06/24/2024] hydrocodone -acetaminophen , losartan , mounjaro , ondansetron , cephalexin , fluconazole , and hydroxyzine . Her primarily concern today is the Back Pain (Right, lower)  Initial Vital Signs:  Pulse/HCG Rate: 77ECG Heart Rate: 82 (nsr) Temp: (!) 97.5 F (36.4 C) Resp: ROLLEN)  1 BP: (!) 147/94 SpO2: 100 %  BMI: Estimated body mass index is 30.65 kg/m as calculated from the following:   Height as of this encounter: 5' 3 (1.6 m).   Weight as of this encounter: 173 lb (78.5 kg).  Risk Assessment: Allergies: Reviewed. She has no known allergies.  Allergy Precautions: None required Coagulopathies: Reviewed. None identified.  Blood-thinner therapy: None at this time Active Infection(s): Reviewed. None identified. Jacqueline Delgado is afebrile  Site Confirmation: Jacqueline Delgado was asked to confirm the procedure and laterality before marking the site Procedure checklist: Completed Consent: Before the procedure and under the influence of no sedative(s), amnesic(s), or anxiolytics, the patient was informed of the treatment options, risks and possible complications. To fulfill our ethical and legal obligations, as recommended by the American Medical Association's Code of Ethics, I have informed the patient of my clinical impression; the nature and purpose of the treatment or procedure; the risks, benefits, and possible complications of the intervention; the alternatives, including doing nothing; the risk(s) and benefit(s) of the alternative treatment(s) or procedure(s); and the risk(s) and benefit(s) of doing nothing. The patient was provided information about the general risks and possible complications associated with the procedure. These may include, but are not limited to: failure to achieve desired goals, infection, bleeding, organ or nerve damage, allergic reactions, paralysis, and death. In addition, the patient was informed of those risks and  complications associated to Spine-related procedures, such as failure to decrease pain; infection (i.e.: Meningitis, epidural or intraspinal abscess); bleeding (i.e.: epidural hematoma, subarachnoid hemorrhage, or any other type of intraspinal or peri-dural bleeding); organ or nerve damage (i.e.: Any type of peripheral nerve, nerve root, or spinal cord injury) with subsequent damage to sensory, motor, and/or autonomic systems, resulting in permanent pain, numbness, and/or weakness of one or several areas of the body; allergic reactions; (i.e.: anaphylactic reaction); and/or death. Furthermore, the patient was informed of those risks and complications associated with the medications. These include, but are not limited to: allergic reactions (i.e.: anaphylactic or anaphylactoid reaction(s)); adrenal axis suppression; blood sugar elevation that in diabetics may result in ketoacidosis or comma; water retention that in patients with history of congestive heart failure may result in shortness of breath, pulmonary edema, and decompensation with resultant heart failure; weight gain; swelling or edema; medication-induced neural toxicity; particulate matter embolism and blood vessel occlusion with resultant organ, and/or nervous system infarction; and/or aseptic necrosis of one or more joints. Finally, the patient was informed that Medicine is not an exact science; therefore, there is also the possibility of unforeseen or unpredictable risks and/or possible complications that may result in a catastrophic outcome. The patient indicated having understood very clearly. We have given the patient no guarantees and we have made no promises. Enough time was given to the patient to ask questions, all of which were answered to the patient's satisfaction. Ms. Lashomb has indicated that she wanted to continue with the procedure. Attestation: I, the ordering provider, attest that I have discussed with the patient the benefits, risks,  side-effects, alternatives, likelihood of achieving goals, and potential problems during recovery for the procedure that I have provided informed consent. Date  Time: 06/17/2024 10:10 AM  Pre-Procedure Preparation:  Monitoring: As per clinic protocol. Respiration, ETCO2, SpO2, BP, heart rate and rhythm monitor placed and checked for adequate function Safety Precautions: Patient was assessed for positional comfort and pressure points before starting the procedure. Time-out: I initiated and conducted the Time-out before starting the procedure, as per protocol.  The patient was asked to participate by confirming the accuracy of the Time Out information. Verification of the correct person, site, and procedure were performed and confirmed by me, the nursing staff, and the patient. Time-out conducted as per Joint Commission's Universal Protocol (UP.01.01.01). Time: 1050 Start Time: 1050 hrs.  Description of Procedure:          Laterality: See above. Levels:  See above. Safety Precautions: Aspiration looking for blood return was conducted prior to all injections. At no point did we inject any substances, as a needle was being advanced. Before injecting, the patient was told to immediately notify me if she was experiencing any new onset of ringing in the ears, or metallic taste in the mouth. No attempts were made at seeking any paresthesias. Safe injection practices and needle disposal techniques used. Medications properly checked for expiration dates. SDV (single dose vial) medications used. After the completion of the procedure, all disposable equipment used was discarded in the proper designated medical waste containers. Local Anesthesia: Protocol guidelines were followed. The patient was positioned over the fluoroscopy table. The area was prepped in the usual manner. The time-out was completed. The target area was identified using fluoroscopy. A 12-in long, straight, sterile hemostat was used with  fluoroscopic guidance to locate the targets for each level blocked. Once located, the skin was marked with an approved surgical skin marker. Once all sites were marked, the skin (epidermis, dermis, and hypodermis), as well as deeper tissues (fat, connective tissue and muscle) were infiltrated with a small amount of a short-acting local anesthetic, loaded on a 10cc syringe with a 25G, 1.5-in  Needle. An appropriate amount of time was allowed for local anesthetics to take effect before proceeding to the next step. Technical description of process:  Radiofrequency Ablation (RFA) L2 Medial Branch Nerve RFA: The target area for the L2 medial branch is at the junction of the postero-lateral aspect of the superior articular process and the superior, posterior, and medial edge of the transverse process of L3. Under fluoroscopic guidance, a Radiofrequency needle was inserted until contact was made with os over the superior postero-lateral aspect of the pedicular shadow (target area). Sensory and motor testing was conducted to properly adjust the position of the needle. Once satisfactory placement of the needle was achieved, the numbing solution was slowly injected after negative aspiration for blood. 2.0 mL of the nerve block solution was injected without difficulty or complication. After waiting for at least 3 minutes, the ablation was performed. Once completed, the needle was removed intact. L3 Medial Branch Nerve RFA: The target area for the L3 medial branch is at the junction of the postero-lateral aspect of the superior articular process and the superior, posterior, and medial edge of the transverse process of L4. Under fluoroscopic guidance, a Radiofrequency needle was inserted until contact was made with os over the superior postero-lateral aspect of the pedicular shadow (target area). Sensory and motor testing was conducted to properly adjust the position of the needle. Once satisfactory placement of the needle  was achieved, the numbing solution was slowly injected after negative aspiration for blood. 2.0 mL of the nerve block solution was injected without difficulty or complication. After waiting for at least 3 minutes, the ablation was performed. Once completed, the needle was removed intact. L4 Medial Branch Nerve RFA: The target area for the L4 medial branch is at the junction of the postero-lateral aspect of the superior articular process and the superior, posterior, and medial edge of the transverse  process of L5. Under fluoroscopic guidance, a Radiofrequency needle was inserted until contact was made with os over the superior postero-lateral aspect of the pedicular shadow (target area). Sensory and motor testing was conducted to properly adjust the position of the needle. Once satisfactory placement of the needle was achieved, the numbing solution was slowly injected after negative aspiration for blood. 2.0 mL of the nerve block solution was injected without difficulty or complication. After waiting for at least 3 minutes, the ablation was performed. Once completed, the needle was removed intact. L5 Medial Branch Nerve RFA: The target area for the L5 medial branch is at the junction of the postero-lateral aspect of the superior articular process of S1 and the superior, posterior, and medial edge of the sacral ala. Under fluoroscopic guidance, a Radiofrequency needle was inserted until contact was made with os over the superior postero-lateral aspect of the pedicular shadow (target area). Sensory and motor testing was conducted to properly adjust the position of the needle. Once satisfactory placement of the needle was achieved, the numbing solution was slowly injected after negative aspiration for blood. 2.0 mL of the nerve block solution was injected without difficulty or complication. After waiting for at least 3 minutes, the ablation was performed. Once completed, the needle was removed intact. S1 Medial  Branch Nerve RFA: The target area for the S1 medial branch is located inferior to the junction of the S1 superior articular process and the L5 inferior articular process, posterior, inferior, and lateral to the 6 o'clock position of the L5-S1 facet joint, just superior to the S1 posterior foramen. Under fluoroscopic guidance, the Radiofrequency needle was advanced until contact was made with os over the Target area. Sensory and motor testing was conducted to properly adjust the position of the needle. Once satisfactory placement of the needle was achieved, the numbing solution was slowly injected after negative aspiration for blood. 2.0 mL of the nerve block solution was injected without difficulty or complication. After waiting for at least 3 minutes, the ablation was performed. Once completed, the needle was removed intact. Radiofrequency lesioning (ablation):  Radiofrequency Generator: Medtronic AccurianTM AG 1000 RF Generator Sensory Stimulation Parameters: 50 Hz was used to locate & identify the nerve, making sure that the needle was positioned such that there was no sensory stimulation below 0.3 V or above 0.7 V. Motor Stimulation Parameters: 2 Hz was used to evaluate the motor component. Care was taken not to lesion any nerves that demonstrated motor stimulation of the lower extremities at an output of less than 2.5 times that of the sensory threshold, or a maximum of 2.0 V. Lesioning Technique Parameters: Standard Radiofrequency settings. (Not bipolar or pulsed.) Temperature Settings: 80 degrees C Lesioning time: 60 seconds Stationary intra-operative compliance: Compliant  Once the entire procedure was completed, the treated area was cleaned, making sure to leave some of the prepping solution back to take advantage of its long term bactericidal properties.    Illustration of the posterior view of the lumbar spine and the posterior neural structures. Laminae of L2 through S1 are labeled. DPRL5,  dorsal primary ramus of L5; DPRS1, dorsal primary ramus of S1; DPR3, dorsal primary ramus of L3; FJ, facet (zygapophyseal) joint L3-L4; I, inferior articular process of L4; LB1, lateral branch of dorsal primary ramus of L1; IAB, inferior articular branches from L3 medial branch (supplies L4-L5 facet joint); IBP, intermediate branch plexus; MB3, medial branch of dorsal primary ramus of L3; NR3, third lumbar nerve root; S, superior articular process of  L5; SAB, superior articular branches from L4 (supplies L4-5 facet joint also); TP3, transverse process of L3.  Facet Joint Innervation (* possible contribution)  L1-2 T12, L1 (L2*)  Medial Branch  L2-3 L1, L2 (L3*)                     L3-4 L2, L3 (L4*)                     L4-5 L3, L4 (L5*)                     L5-S1 L4, L5, S1                        Vitals:   06/17/24 1127 06/17/24 1135 06/17/24 1145 06/17/24 1156  BP:  (!) 151/85 (!) 158/92 (!) 163/90  Pulse:      Resp: 16 10 15 15   Temp:  (!) 97.3 F (36.3 C)  (!) 97.3 F (36.3 C)  TempSrc:      SpO2: 100% 99% 100% 100%  Weight:      Height:        Start Time: 1050 hrs. End Time:   hrs.  Imaging Guidance (Spinal):         Type of Imaging Technique: Fluoroscopy Guidance (Spinal) Indication(s): Fluoroscopy guidance for needle placement to enhance accuracy in procedures requiring precise needle localization for targeted delivery of medication in or near specific anatomical locations not easily accessible without such real-time imaging assistance. Exposure Time: Please see nurses notes. Contrast: None used. Fluoroscopic Guidance: I was personally present during the use of fluoroscopy. Tunnel Vision Technique used to obtain the best possible view of the target area. Parallax error corrected before commencing the procedure. Direction-depth-direction technique used to introduce the needle under continuous pulsed fluoroscopy. Once target was reached, antero-posterior, oblique, and  lateral fluoroscopic projection used confirm needle placement in all planes. Images permanently stored in EMR. Interpretation: No contrast injected. I personally interpreted the imaging intraoperatively. Adequate needle placement confirmed in multiple planes. Permanent images saved into the patient's record.  Antibiotic Prophylaxis:   Anti-infectives (From admission, onward)    None      Indication(s): None identified  Post-operative Assessment:  Post-procedure Vital Signs:  Pulse/HCG Rate: 8072 Temp: (!) 97.3 F (36.3 C) Resp: 15 BP: (!) 163/90 SpO2: 100 %  EBL: None  Complications: No immediate post-treatment complications observed by team, or reported by patient.  Note: The patient tolerated the entire procedure well. A repeat set of vitals were taken after the procedure and the patient was kept under observation following institutional policy, for this type of procedure. Post-procedural neurological assessment was performed, showing return to baseline, prior to discharge. The patient was provided with post-procedure discharge instructions, including a section on how to identify potential problems. Should any problems arise concerning this procedure, the patient was given instructions to immediately contact us , at any time, without hesitation. In any case, we plan to contact the patient by telephone for a follow-up status report regarding this interventional procedure.  Comments:  No additional relevant information.  Plan of Care (POC)  Orders:  Orders Placed This Encounter  Procedures   Radiofrequency,Lumbar    Scheduling Instructions:     Side(s): Right-sided     Level: L3-4, L4-5, and L5-S1 Facets (L2, L3, L4, L5, and S1 Medial Branch)     Sedation: With Sedation.     Date: 06/17/2024  Where will this procedure be performed?:   [facility]   Radiofrequency,Lumbar    Standing Status:   Future    Expiration Date:   09/16/2024    Scheduling Instructions:      Side(s): Left-sided     Level: L3-4, L4-5, and L5-S1 Facets (L2, L3, L4, L5, and S1 Medial Branch)     Sedation: With Sedation.     Timeframe: 2 weeks from now.    Where will this procedure be performed?:   [facility]   DG PAIN [facility] C-ARM 1-60 MIN NO REPORT    Intraoperative interpretation by procedural physician at [facility].    Standing Status:   Standing    Number of Occurrences:   1    Reason for exam::   Assistance in needle guidance and placement for procedures requiring needle placement in or near specific anatomical locations not easily accessible without such assistance.   Informed Consent Details: Physician/Practitioner Attestation; Transcribe to consent form and obtain patient signature    Nursing Order: Transcribe to consent form and obtain patient signature. Note: Always confirm laterality of pain with Ms. Mollenkopf, before procedure.    Physician/Practitioner attestation of informed consent for procedure/surgical case:   I, the physician/practitioner, attest that I have discussed with the patient the benefits, risks, side effects, alternatives, likelihood of achieving goals and potential problems during recovery for the procedure that I have provided informed consent.    Procedure:   Lumbar Facet Radiofrequency Ablation    Physician/Practitioner performing the procedure:   Laker Thompson A. Tanya, MD    Indication/Reason:   Low Back Pain, with our without leg pain, due to Facet Joint Arthralgia (Joint Pain) known as Lumbar Facet Syndrome, secondary to Lumbar, and/or Lumbosacral Spondylosis (Arthritis of the Spine), without myelopathy or radiculopathy (Nerve Damage).   Provide equipment / supplies at bedside    Procedure tray: Radiofrequency Tray Additional material: Large hemostat (x1); Small hemostat (x1); Towels (x8); 4x4 sterile sponge pack (x1) Needle type: Teflon-coated Radiofrequency Needle (Disposable  single use) Size: Long Quantity: 5    Standing  Status:   Standing    Number of Occurrences:   1    Specify:   Radiofrequency Tray   Saline lock IV    Have LR [phone] mL available and administer at 125 mL/hr if patient becomes hypotensive.    Standing Status:   Standing    Number of Occurrences:   1     Opioid Analgesic:  No chronic opioid analgesics therapy prescribed by our practice. Tramadol  50 mg tablet, 1 tab p.o. twice daily (PRN) MME/day: 0-10 mg/day    Medications ordered for procedure: Meds ordered this encounter  Medications   lidocaine  (XYLOCAINE ) 2 % (with pres) injection 400 mg   pentafluoroprop-tetrafluoroeth (GEBAUERS) aerosol   midazolam  (VERSED ) 5 MG/5ML injection 0.5-2 mg    Make sure Flumazenil is available in the pyxis when using this medication. If oversedation occurs, administer 0.2 mg IV over 15 sec. If after 45 sec no response, administer 0.2 mg again over 1 min; may repeat at 1 min intervals; not to exceed 4 doses (1 mg)   fentaNYL  (SUBLIMAZE ) injection 25-50 mcg    Make sure Narcan is available in the pyxis when using this medication. In the event of respiratory depression (RR< 8/min): Titrate NARCAN (naloxone) in increments of 0.1 to 0.2 mg IV at 2-3 minute intervals, until desired degree of reversal.   ropivacaine  (PF) 2 mg/mL (0.2%) (NAROPIN ) injection 9 mL  triamcinolone  acetonide (KENALOG -40) injection 40 mg   HYDROcodone -acetaminophen  (NORCO/VICODIN) 5-325 MG tablet    Sig: Take 1 tablet by mouth every 6 (six) hours as needed for up to 7 days for severe pain (pain score 7-10). Must last 7 days.    Dispense:  28 tablet    Refill:  0    For acute post-operative pain. Not to be refilled. Must last 7 days.   HYDROcodone -acetaminophen  (NORCO/VICODIN) 5-325 MG tablet    Sig: Take 1 tablet by mouth every 6 (six) hours as needed for up to 7 days for severe pain (pain score 7-10). Must last 7 days.    Dispense:  28 tablet    Refill:  0    For acute post-operative pain. Not to be refilled.  Must last 7  days.   Medications administered: We administered lidocaine , pentafluoroprop-tetrafluoroeth, midazolam , fentaNYL , ropivacaine  (PF) 2 mg/mL (0.2%), and triamcinolone  acetonide.  See the medical record for exact dosing, route, and time of administration.    Interventional Therapies  Risk Factors  Considerations:  MO (BMI>30)  OSA  SOB  Cerebrovascular malformation  GERD  HTN     Planned  Pending:      Under consideration:   Therapeutic/palliative bilateral L-FCT RFA #1  Therapeutic left CESI #2    Completed:   Therapeutic/palliative bilateral lumbar facet MBB x5 (04/13/2024) (100/100/100 x1 week/0)  Diagnostic/therapeutic left IA glenohumeral + AC joint inj. + subacromial bursa inj. x1 (01/09/2023) (100/100/70/70 x 4 months) Therapeutic left cervical ESI x1 (11/26/2022) (75/75/50/50) (Neck: 100  (L) shoulder: 50)  Therapeutic/palliative bilateral lumbar facet MBB x4 (06/19/2023) (100/100/100/100 x 6 months)  Therapeutic right L4-5 LESI x1 (07/27/2020)  Referral to bariatric surgery and medical weight management entered on 07/18/2020.    Completed by other providers:   EEG x2 by Dr. Jannett Fairly (07/03/14, 07/29/14) - WNL  Diagnostic left occipital nerve block x1 (08/10/2014) by Dr. Jannett Fairly [facility] neurology)  Therapeutic right rotator cuff repair x1 (06/14/2015) by HILARIO WENDI Glenn MD [facility] orthopedics)  Therapeutic left IA Synvisc knee injection x2 (10/02/2020) by Morene Redell Sharps, PA [facility] orthopedics and PMR) Therapeutic left total knee replacement x1 (03/06/2021) by Dr. Ozell Flake [facility] orthopedics)   Therapeutic  Palliative (PRN) options:   Palliative bilateral lumbar facet MBB #4  Therapeutic right L4-5 LESI #2       Follow-up plan:   Return for ( ), (ECT): (L) L-FCT RFA #1.     Recent Visits Date Type Provider Dept  05/31/24 Office Visit Tanya Glisson, MD [facility]  04/13/24 Procedure visit Tanya Glisson, MD [facility]   04/07/24 Office Visit Tanya Glisson, MD [facility]  Showing recent visits within past 90 days and meeting all other requirements Today's Visits Date Type Provider Dept  06/17/24 Procedure visit Tanya Glisson, MD [facility]  Showing today's visits and meeting all other requirements Future Appointments Date Type Provider Dept  07/20/24 Appointment Tanya Glisson, MD [facility]  Showing future appointments within next 90 days and meeting all other requirements   Disposition: Discharge home  Discharge (Date  Time): 06/17/2024; 1157 hrs.   Primary Care Physician: Dineen Rollene MATSU, FNP Location: [facility] Note by: Glisson DELENA Tanya, MD (TTS technology used. I apologize for any typographical errors that were not detected and corrected.) Date: 06/17/2024; Time: 12:37 PM  Disclaimer:  Medicine is not an Visual merchandiser. The only guarantee in medicine is that nothing is guaranteed. It is important to  note that the decision to proceed with this intervention was based on the information collected from the patient. The Data and conclusions were drawn from the patient's questionnaire, the interview, and the physical examination. Because the information was provided in large part by the patient, it cannot be guaranteed that it has not been purposely or unconsciously manipulated. Every effort has been made to obtain as much relevant data as possible for this evaluation. It is important to note that the conclusions that lead to this procedure are derived in large part from the available data. Always take into account that the treatment will also be dependent on availability of resources and existing treatment guidelines, considered by other Pain Management Practitioners as being common knowledge and practice, at the time of the intervention. For Medico-Legal purposes, it is also important to point out that variation in procedural  techniques and pharmacological choices are the acceptable norm. The indications, contraindications, technique, and results of the above procedure should only be interpreted and judged by a Board-Certified Interventional Pain Specialist with extensive familiarity and expertise in the same exact procedure and technique.

## 2024-06-18 ENCOUNTER — Telehealth: Payer: Self-pay | Admitting: *Deleted

## 2024-06-18 NOTE — Telephone Encounter (Signed)
 Post procedure call; doing well, had some numbness in her leg on yesterday and had to use walker to get around. After approx 5 - 6 numbness went away and she did fine.

## 2024-07-07 ENCOUNTER — Ambulatory Visit: Admitting: Family

## 2024-07-07 DIAGNOSIS — I10 Essential (primary) hypertension: Secondary | ICD-10-CM

## 2024-07-07 DIAGNOSIS — R899 Unspecified abnormal finding in specimens from other organs, systems and tissues: Secondary | ICD-10-CM

## 2024-07-13 ENCOUNTER — Other Ambulatory Visit: Payer: Self-pay | Admitting: Family

## 2024-07-13 DIAGNOSIS — K449 Diaphragmatic hernia without obstruction or gangrene: Secondary | ICD-10-CM | POA: Diagnosis not present

## 2024-07-13 DIAGNOSIS — Z96652 Presence of left artificial knee joint: Secondary | ICD-10-CM | POA: Diagnosis not present

## 2024-07-13 DIAGNOSIS — T8484XA Pain due to internal orthopedic prosthetic devices, implants and grafts, initial encounter: Secondary | ICD-10-CM | POA: Diagnosis not present

## 2024-07-13 DIAGNOSIS — K5909 Other constipation: Secondary | ICD-10-CM | POA: Diagnosis not present

## 2024-07-13 DIAGNOSIS — K219 Gastro-esophageal reflux disease without esophagitis: Secondary | ICD-10-CM | POA: Diagnosis not present

## 2024-07-15 ENCOUNTER — Encounter: Payer: Self-pay | Admitting: Pain Medicine

## 2024-07-15 ENCOUNTER — Ambulatory Visit
Admission: RE | Admit: 2024-07-15 | Discharge: 2024-07-15 | Disposition: A | Discharge status: Home / Self Care | Source: Ambulatory Visit | Attending: Pain Medicine | Admitting: Pain Medicine

## 2024-07-15 ENCOUNTER — Ambulatory Visit (HOSPITAL_BASED_OUTPATIENT_CLINIC_OR_DEPARTMENT_OTHER): Admitting: Pain Medicine

## 2024-07-15 VITALS — BP 171/98 | HR 82 | Temp 98.3°F | Resp 14 | Ht 63.0 in | Wt 170.0 lb

## 2024-07-15 DIAGNOSIS — M4310 Spondylolisthesis, site unspecified: Secondary | ICD-10-CM

## 2024-07-15 DIAGNOSIS — Z09 Encounter for follow-up examination after completed treatment for conditions other than malignant neoplasm: Secondary | ICD-10-CM | POA: Diagnosis not present

## 2024-07-15 DIAGNOSIS — M47816 Spondylosis without myelopathy or radiculopathy, lumbar region: Secondary | ICD-10-CM

## 2024-07-15 DIAGNOSIS — G8918 Other acute postprocedural pain: Secondary | ICD-10-CM

## 2024-07-15 DIAGNOSIS — G8929 Other chronic pain: Secondary | ICD-10-CM

## 2024-07-15 DIAGNOSIS — M47817 Spondylosis without myelopathy or radiculopathy, lumbosacral region: Secondary | ICD-10-CM | POA: Insufficient documentation

## 2024-07-15 DIAGNOSIS — M5459 Other low back pain: Secondary | ICD-10-CM | POA: Insufficient documentation

## 2024-07-15 DIAGNOSIS — M545 Low back pain, unspecified: Secondary | ICD-10-CM

## 2024-07-15 MED ORDER — TRIAMCINOLONE ACETONIDE 40 MG/ML IJ SUSP
40.0000 mg | Freq: Once | INTRAMUSCULAR | Status: AC
  Administered 2024-07-15: 40 mg

## 2024-07-15 MED ORDER — FENTANYL CITRATE (PF) 100 MCG/2ML IJ SOLN
INTRAMUSCULAR | Status: AC
  Filled 2024-07-15: qty 2

## 2024-07-15 MED ORDER — HYDROCODONE-ACETAMINOPHEN 5-325 MG PO TABS: 1.0000 | ORAL_TABLET | Freq: Three times a day (TID) | ORAL | 0 refills | Status: AC | PRN

## 2024-07-15 MED ORDER — MIDAZOLAM HCL 5 MG/5ML IJ SOLN
0.5000 mg | Freq: Once | INTRAMUSCULAR | Status: AC
  Administered 2024-07-15: 3 mg via INTRAVENOUS

## 2024-07-15 MED ORDER — LIDOCAINE HCL 2 % IJ SOLN
INTRAMUSCULAR | Status: AC
  Filled 2024-07-15: qty 20

## 2024-07-15 MED ORDER — TRIAMCINOLONE ACETONIDE 40 MG/ML IJ SUSP
INTRAMUSCULAR | Status: AC
  Filled 2024-07-15: qty 1

## 2024-07-15 MED ORDER — MIDAZOLAM HCL 5 MG/5ML IJ SOLN
INTRAMUSCULAR | Status: AC
  Filled 2024-07-15: qty 5

## 2024-07-15 MED ORDER — FENTANYL CITRATE (PF) 100 MCG/2ML IJ SOLN
25.0000 ug | INTRAMUSCULAR | Status: DC | PRN
  Administered 2024-07-15: 100 ug via INTRAVENOUS

## 2024-07-15 MED ORDER — ROPIVACAINE HCL 2 MG/ML IJ SOLN
INTRAMUSCULAR | Status: AC
  Filled 2024-07-15: qty 20

## 2024-07-15 MED ORDER — PENTAFLUOROPROP-TETRAFLUOROETH EX AERO
INHALATION_SPRAY | Freq: Once | CUTANEOUS | Status: AC
  Administered 2024-07-15: 30 via TOPICAL

## 2024-07-15 MED ORDER — LIDOCAINE HCL 2 % IJ SOLN
20.0000 mL | Freq: Once | INTRAMUSCULAR | Status: AC
  Administered 2024-07-15: 400 mg

## 2024-07-15 MED ORDER — ROPIVACAINE HCL 2 MG/ML IJ SOLN
9.0000 mL | Freq: Once | INTRAMUSCULAR | Status: AC
  Administered 2024-07-15: 9 mL via PERINEURAL

## 2024-07-15 NOTE — Patient Instructions (Signed)

## 2024-07-15 NOTE — Progress Notes (Signed)
 PROVIDER NOTE: Interpretation of information contained herein should be left to medically-trained personnel. Specific patient instructions are provided elsewhere under Patient Instructions section of medical record. This document was created in part using STT-dictation technology, any transcriptional errors that may result from this process are unintentional.  Patient: Jacqueline Delgado Type: Established DOB: Jan 06, 1951 MRN: [id number] PCP: Dineen Rollene MATSU, FNP  Service: Procedure DOS: 07/15/2024 Setting: Ambulatory Location: Ambulatory outpatient facility Delivery: Face-to-face Provider: Eric DELENA Como, MD Specialty: Interventional Pain Management Specialty designation: 09 Location: Outpatient facility Ref. Prov.: Dineen Rollene MATSU, FNP       Interventional Therapy   Procedure: Lumbar Facet, Medial Branch Radiofrequency Ablation (RFA) #1  Laterality: Left (-LT)  Level: L2, L3, L4, L5, and S1 Medial Branch Level(s). These levels will denervate the L3-4, L4-5, and L5-S1 lumbar facet joints.  Imaging: Fluoroscopy-guided Spinal ([id number]) Anesthesia: Local anesthesia (1-2% Lidocaine ) Anxiolysis: IV Versed  3.0 mg Sedation: Minimal Sedation Fentanyl  2.0 mL (100 mcg) DOS: 07/15/2024  Performed by: Eric DELENA Como, MD  Purpose: Therapeutic/Palliative Indications: Low back pain severe enough to impact quality of life or function. Indications: 1. Chronic low back pain (Bilateral) (R>L) w/o sciatica   2. Lumbar facet joint pain   3. Lumbar Grade 1 Anterolisthesis of L4/L5   4. Lumbar facet hypertrophy   5. Low back pain of over 3 months duration   6. Lumbosacral facet syndrome (Bilateral) (R>L)   7. Multifactorial low back pain   8. Osteoarthritis of lumbar spine   9. Spondylosis without myelopathy or radiculopathy, lumbosacral region    Jacqueline Delgado has been dealing with the above chronic pain for longer than three months and has either failed to respond, was unable to tolerate,  or simply did not get enough benefit from other more conservative therapies including, but not limited to: 1. Over-the-counter medications 2. Anti-inflammatory medications 3. Muscle relaxants 4. Membrane stabilizers 5. Opioids 6. Physical therapy and/or chiropractic manipulation 7. Modalities (Heat, ice, etc.) 8. Invasive techniques such as nerve blocks. Jacqueline Delgado has attained more than 50% relief of the pain from a series of diagnostic injections conducted in separate occasions.  Pain Score: Pre-procedure: 5 /10 Post-procedure: 0-No pain/10     Position / Prep / Materials:  Position: Prone  Prep solution: ChloraPrep (2% chlorhexidine  gluconate and 70% isopropyl alcohol) Prep Area: Entire Lumbosacral Region (Lower back from mid-thoracic region to end of tailbone and from flank to flank.) Materials:  Tray: RFA (Radiofrequency) tray Needle(s):  Type: RFA (Teflon-coated radiofrequency ablation needles) Gauge (G): 22  Length: Regular (10cm) Qty: 5     Post-Procedure Evaluation   Procedure: Lumbar Facet, Medial Branch Radiofrequency Ablation (RFA) #1  Laterality: Right (-RT)  Level: L2, L3, L4, L5, and S1 Medial Branch Level(s). These levels will denervate the L3-4, L4-5, and L5-S1 lumbar facet joints.  Imaging: Fluoroscopy-guided Spinal ([id number]) Anesthesia: Local anesthesia (1-2% Lidocaine ) Anxiolysis: IV Versed  3.0 mg Sedation: Minimal Sedation Fentanyl  2.0 mL (100 mcg) DOS: 06/17/2024  Performed by: Eric DELENA Como, MD Purpose: Therapeutic/Palliative Indications: Low back pain severe enough to impact quality of life or function.  Pain Score: Pre-procedure: 5 /10 Post-procedure: 0-No pain/10    Effectiveness:  Initial hour after procedure: 100 %. Subsequent 4-6 hours post-procedure: 100 %. Analgesia past initial 6 hours: 75 %. Ongoing improvement:  Analgesic:  75% Function: Jacqueline Delgado reports improvement in function ROM: Jacqueline Delgado reports improvement in  ROM   H&P (Pre-op Assessment):  Jacqueline Delgado is a 73 y.o. (year old), female patient, seen  today for interventional treatment. She  has a past surgical history that includes Joint replacement (Right); Joint replacement; Cholecystectomy; Abdominal hysterectomy; Shoulder arthroscopy with rotator cuff repair and subacromial decompression (Right, 06/14/2015); Esophagogastroduodenoscopy (egd) with propofol  (N/A, 10/11/2015); Savory dilation (N/A, 10/11/2015); Colonoscopy w/ polypectomy; Esophagogastroduodenoscopy (egd) with propofol  (N/A, 12/10/2017); Colonoscopy with propofol  (N/A, 12/10/2017); Total knee arthroplasty (Left, 03/06/2021); Joint replacement (Left); Esophagogastroduodenoscopy (egd) with propofol  (N/A, 05/03/2022); Breast surgery; Carpal tunnel release; Trigger finger release; Colonoscopy with propofol  (N/A, 04/25/2023); and polypectomy (04/25/2023). Jacqueline Delgado has a current medication list which includes the following prescription(s): aciphex , famotidine , fluticasone , hydrocodone -acetaminophen , [START ON 07/22/2024] hydrocodone -acetaminophen , losartan , mounjaro , ondansetron , cephalexin , fluconazole , and hydroxyzine , and the following Facility-Administered Medications: fentanyl . Her primarily concern today is the Back Pain (low)  Initial Vital Signs:  Pulse/HCG Rate: 82ECG Heart Rate: 74 (nsr) Temp: 98.1 F (36.7 C) Resp: 16 BP: (!) 157/97 SpO2: 99 %  BMI: Estimated body mass index is 30.11 kg/m as calculated from the following:   Height as of this encounter: 5' 3 (1.6 m).   Weight as of this encounter: 170 lb (77.1 kg).  Risk Assessment: Allergies: Reviewed. She has no known allergies.  Allergy Precautions: None required Coagulopathies: Reviewed. None identified.  Blood-thinner therapy: None at this time Active Infection(s): Reviewed. None identified. Jacqueline Delgado is afebrile  Site Confirmation: Jacqueline Delgado was asked to confirm the procedure and laterality before marking the  site Procedure checklist: Completed Consent: Before the procedure and under the influence of no sedative(s), amnesic(s), or anxiolytics, the patient was informed of the treatment options, risks and possible complications. To fulfill our ethical and legal obligations, as recommended by the American Medical Association's Code of Ethics, I have informed the patient of my clinical impression; the nature and purpose of the treatment or procedure; the risks, benefits, and possible complications of the intervention; the alternatives, including doing nothing; the risk(s) and benefit(s) of the alternative treatment(s) or procedure(s); and the risk(s) and benefit(s) of doing nothing. The patient was provided information about the general risks and possible complications associated with the procedure. These may include, but are not limited to: failure to achieve desired goals, infection, bleeding, organ or nerve damage, allergic reactions, paralysis, and death. In addition, the patient was informed of those risks and complications associated to Spine-related procedures, such as failure to decrease pain; infection (i.e.: Meningitis, epidural or intraspinal abscess); bleeding (i.e.: epidural hematoma, subarachnoid hemorrhage, or any other type of intraspinal or peri-dural bleeding); organ or nerve damage (i.e.: Any type of peripheral nerve, nerve root, or spinal cord injury) with subsequent damage to sensory, motor, and/or autonomic systems, resulting in permanent pain, numbness, and/or weakness of one or several areas of the body; allergic reactions; (i.e.: anaphylactic reaction); and/or death. Furthermore, the patient was informed of those risks and complications associated with the medications. These include, but are not limited to: allergic reactions (i.e.: anaphylactic or anaphylactoid reaction(s)); adrenal axis suppression; blood sugar elevation that in diabetics may result in ketoacidosis or comma; water retention  that in patients with history of congestive heart failure may result in shortness of breath, pulmonary edema, and decompensation with resultant heart failure; weight gain; swelling or edema; medication-induced neural toxicity; particulate matter embolism and blood vessel occlusion with resultant organ, and/or nervous system infarction; and/or aseptic necrosis of one or more joints. Finally, the patient was informed that Medicine is not an exact science; therefore, there is also the possibility of unforeseen or unpredictable risks and/or possible complications that may result in a catastrophic outcome.  The patient indicated having understood very clearly. We have given the patient no guarantees and we have made no promises. Enough time was given to the patient to ask questions, all of which were answered to the patient's satisfaction. Ms. Squyres has indicated that she wanted to continue with the procedure. Attestation: I, the ordering provider, attest that I have discussed with the patient the benefits, risks, side-effects, alternatives, likelihood of achieving goals, and potential problems during recovery for the procedure that I have provided informed consent. Date  Time: 07/15/2024  9:26 AM  Pre-Procedure Preparation:  Monitoring: As per clinic protocol. Respiration, ETCO2, SpO2, BP, heart rate and rhythm monitor placed and checked for adequate function Safety Precautions: Patient was assessed for positional comfort and pressure points before starting the procedure. Time-out: I initiated and conducted the Time-out before starting the procedure, as per protocol. The patient was asked to participate by confirming the accuracy of the Time Out information. Verification of the correct person, site, and procedure were performed and confirmed by me, the nursing staff, and the patient. Time-out conducted as per Joint Commission's Universal Protocol (UP.01.01.01). Time: 1023 Start Time: 1023  hrs.  Description of Procedure:          Laterality: See above. Levels:  See above. Safety Precautions: Aspiration looking for blood return was conducted prior to all injections. At no point did we inject any substances, as a needle was being advanced. Before injecting, the patient was told to immediately notify me if she was experiencing any new onset of ringing in the ears, or metallic taste in the mouth. No attempts were made at seeking any paresthesias. Safe injection practices and needle disposal techniques used. Medications properly checked for expiration dates. SDV (single dose vial) medications used. After the completion of the procedure, all disposable equipment used was discarded in the proper designated medical waste containers. Local Anesthesia: Protocol guidelines were followed. The patient was positioned over the fluoroscopy table. The area was prepped in the usual manner. The time-out was completed. The target area was identified using fluoroscopy. A 12-in long, straight, sterile hemostat was used with fluoroscopic guidance to locate the targets for each level blocked. Once located, the skin was marked with an approved surgical skin marker. Once all sites were marked, the skin (epidermis, dermis, and hypodermis), as well as deeper tissues (fat, connective tissue and muscle) were infiltrated with a small amount of a short-acting local anesthetic, loaded on a 10cc syringe with a 25G, 1.5-in  Needle. An appropriate amount of time was allowed for local anesthetics to take effect before proceeding to the next step. Technical description of process:  Radiofrequency Ablation (RFA) L2 Medial Branch Nerve RFA: The target area for the L2 medial branch is at the junction of the postero-lateral aspect of the superior articular process and the superior, posterior, and medial edge of the transverse process of L3. Under fluoroscopic guidance, a Radiofrequency needle was inserted until contact was made  with os over the superior postero-lateral aspect of the pedicular shadow (target area). Sensory and motor testing was conducted to properly adjust the position of the needle. Once satisfactory placement of the needle was achieved, the numbing solution was slowly injected after negative aspiration for blood. 2.0 mL of the nerve block solution was injected without difficulty or complication. After waiting for at least 3 minutes, the ablation was performed. Once completed, the needle was removed intact. L3 Medial Branch Nerve RFA: The target area for the L3 medial branch is at the junction  of the postero-lateral aspect of the superior articular process and the superior, posterior, and medial edge of the transverse process of L4. Under fluoroscopic guidance, a Radiofrequency needle was inserted until contact was made with os over the superior postero-lateral aspect of the pedicular shadow (target area). Sensory and motor testing was conducted to properly adjust the position of the needle. Once satisfactory placement of the needle was achieved, the numbing solution was slowly injected after negative aspiration for blood. 2.0 mL of the nerve block solution was injected without difficulty or complication. After waiting for at least 3 minutes, the ablation was performed. Once completed, the needle was removed intact. L4 Medial Branch Nerve RFA: The target area for the L4 medial branch is at the junction of the postero-lateral aspect of the superior articular process and the superior, posterior, and medial edge of the transverse process of L5. Under fluoroscopic guidance, a Radiofrequency needle was inserted until contact was made with os over the superior postero-lateral aspect of the pedicular shadow (target area). Sensory and motor testing was conducted to properly adjust the position of the needle. Once satisfactory placement of the needle was achieved, the numbing solution was slowly injected after negative aspiration  for blood. 2.0 mL of the nerve block solution was injected without difficulty or complication. After waiting for at least 3 minutes, the ablation was performed. Once completed, the needle was removed intact. L5 Medial Branch Nerve RFA: The target area for the L5 medial branch is at the junction of the postero-lateral aspect of the superior articular process of S1 and the superior, posterior, and medial edge of the sacral ala. Under fluoroscopic guidance, a Radiofrequency needle was inserted until contact was made with os over the superior postero-lateral aspect of the pedicular shadow (target area). Sensory and motor testing was conducted to properly adjust the position of the needle. Once satisfactory placement of the needle was achieved, the numbing solution was slowly injected after negative aspiration for blood. 2.0 mL of the nerve block solution was injected without difficulty or complication. After waiting for at least 3 minutes, the ablation was performed. Once completed, the needle was removed intact. S1 Medial Branch Nerve RFA: The target area for the S1 medial branch is located inferior to the junction of the S1 superior articular process and the L5 inferior articular process, posterior, inferior, and lateral to the 6 o'clock position of the L5-S1 facet joint, just superior to the S1 posterior foramen. Under fluoroscopic guidance, the Radiofrequency needle was advanced until contact was made with os over the Target area. Sensory and motor testing was conducted to properly adjust the position of the needle. Once satisfactory placement of the needle was achieved, the numbing solution was slowly injected after negative aspiration for blood. 2.0 mL of the nerve block solution was injected without difficulty or complication. After waiting for at least 3 minutes, the ablation was performed. Once completed, the needle was removed intact. Radiofrequency lesioning (ablation):  Radiofrequency Generator:  Medtronic AccurianTM AG 1000 RF Generator Sensory Stimulation Parameters: 50 Hz was used to locate & identify the nerve, making sure that the needle was positioned such that there was no sensory stimulation below 0.3 V or above 0.7 V. Motor Stimulation Parameters: 2 Hz was used to evaluate the motor component. Care was taken not to lesion any nerves that demonstrated motor stimulation of the lower extremities at an output of less than 2.5 times that of the sensory threshold, or a maximum of 2.0 V. Lesioning Technique Parameters: Standard  Radiofrequency settings. (Not bipolar or pulsed.) Temperature Settings: 80 degrees C Lesioning time: 60 seconds Stationary intra-operative compliance: Compliant  Once the entire procedure was completed, the treated area was cleaned, making sure to leave some of the prepping solution back to take advantage of its long term bactericidal properties.    Illustration of the posterior view of the lumbar spine and the posterior neural structures. Laminae of L2 through S1 are labeled. DPRL5, dorsal primary ramus of L5; DPRS1, dorsal primary ramus of S1; DPR3, dorsal primary ramus of L3; FJ, facet (zygapophyseal) joint L3-L4; I, inferior articular process of L4; LB1, lateral branch of dorsal primary ramus of L1; IAB, inferior articular branches from L3 medial branch (supplies L4-L5 facet joint); IBP, intermediate branch plexus; MB3, medial branch of dorsal primary ramus of L3; NR3, third lumbar nerve root; S, superior articular process of L5; SAB, superior articular branches from L4 (supplies L4-5 facet joint also); TP3, transverse process of L3.  Facet Joint Innervation (* possible contribution)  L1-2 T12, L1 (L2*)  Medial Branch  L2-3 L1, L2 (L3*)                     L3-4 L2, L3 (L4*)                     L4-5 L3, L4 (L5*)                     L5-S1 L4, L5, S1                        Vitals:   07/15/24 1052 07/15/24 1059 07/15/24 1110 07/15/24 1119  BP: (!)  152/90 (!) 157/105 (!) 176/100 (!) 171/98  Pulse:      Resp: 15 12 15 14   Temp:  98.1 F (36.7 C)  98.3 F (36.8 C)  SpO2: 100% 100% 99% 99%  Weight:      Height:        Start Time: 1023 hrs. End Time: 1052 hrs.  Imaging Guidance (Spinal):         Type of Imaging Technique: Fluoroscopy Guidance (Spinal) Indication(s): Fluoroscopy guidance for needle placement to enhance accuracy in procedures requiring precise needle localization for targeted delivery of medication in or near specific anatomical locations not easily accessible without such real-time imaging assistance. Exposure Time: Please see nurses notes. Contrast: None used. Fluoroscopic Guidance: I was personally present during the use of fluoroscopy. Tunnel Vision Technique used to obtain the best possible view of the target area. Parallax error corrected before commencing the procedure. Direction-depth-direction technique used to introduce the needle under continuous pulsed fluoroscopy. Once target was reached, antero-posterior, oblique, and lateral fluoroscopic projection used confirm needle placement in all planes. Images permanently stored in EMR. Interpretation: No contrast injected. I personally interpreted the imaging intraoperatively. Adequate needle placement confirmed in multiple planes. Permanent images saved into the patient's record.  Antibiotic Prophylaxis:   Anti-infectives (From admission, onward)    None      Indication(s): None identified  Post-operative Assessment:  Post-procedure Vital Signs:  Pulse/HCG Rate: 8270 Temp: 98.3 F (36.8 C) Resp: 14 BP: (!) 171/98 SpO2: 99 %  EBL: None  Complications: No immediate post-treatment complications observed by team, or reported by patient.  Note: The patient tolerated the entire procedure well. A repeat set of vitals were taken after the procedure and the patient was kept under observation following institutional policy, for this type of procedure.  Post-procedural neurological assessment was performed, showing return to baseline, prior to discharge. The patient was provided with post-procedure discharge instructions, including a section on how to identify potential problems. Should any problems arise concerning this procedure, the patient was given instructions to immediately contact us , at any time, without hesitation. In any case, we plan to contact the patient by telephone for a follow-up status report regarding this interventional procedure.  Comments:  No additional relevant information.  Plan of Care (POC)  Orders:  Orders Placed This Encounter  Procedures   Radiofrequency,Lumbar    Scheduling Instructions:     Side(s): Left-sided     Level: L3-4, L4-5, and L5-S1 Facets (L2, L3, L4, L5, and S1 Medial Branch)     Sedation: With Sedation.     Date: 07/15/2024    Where will this procedure be performed?:   [facility]   DG [facility] C-ARM 1-60 MIN NO REPORT    Intraoperative interpretation by procedural physician at [facility].    Standing Status:   Standing    Number of Occurrences:   1    Reason for exam::   Assistance in needle guidance and placement for procedures requiring needle placement in or near specific anatomical locations not easily accessible without such assistance.   Informed Consent Details: Physician/Practitioner Attestation; Transcribe to consent form and obtain patient signature    Nursing Order: Transcribe to consent form and obtain patient signature. Note: Always confirm laterality of pain with Ms. Feltz, before procedure.    Physician/Practitioner attestation of informed consent for procedure/surgical case:   I, the physician/practitioner, attest that I have discussed with the patient the benefits, risks, side effects, alternatives, likelihood of achieving goals and potential problems during recovery for the procedure that I have provided informed consent.    Procedure:   Lumbar Facet  Radiofrequency Ablation    Physician/Practitioner performing the procedure:   Thamara Leger A. Tanya, MD    Indication/Reason:   Low Back Pain, with our without leg pain, due to Facet Joint Arthralgia (Joint Pain) known as Lumbar Facet Syndrome, secondary to Lumbar, and/or Lumbosacral Spondylosis (Arthritis of the Spine), without myelopathy or radiculopathy (Nerve Damage).   Provide equipment / supplies at bedside    Procedure tray: Radiofrequency Tray Additional material: Large hemostat (x1); Small hemostat (x1); Towels (x8); 4x4 sterile sponge pack (x1) Needle type: Teflon-coated Radiofrequency Needle (Disposable  single use) Size: Regular Quantity: 5    Standing Status:   Standing    Number of Occurrences:   1    Specify:   Radiofrequency Tray   Nursing Instructions:    Please complete this patient's postprocedure evaluation.    Scheduling Instructions:     Please complete this patient's postprocedure evaluation.   Saline lock IV    Have LR [phone] available and administer at 125 mL/hr if patient becomes hypotensive.    Standing Status:   Standing    Number of Occurrences:   1     Opioid Analgesic:  No chronic opioid analgesics therapy prescribed by our practice. Tramadol  50 mg tablet, 1 tab p.o. twice daily (PRN) MME/day: 0-10 mg/day    Medications ordered for procedure: Meds ordered this encounter  Medications   lidocaine  (XYLOCAINE ) 2 % (with pres) injection 400 mg   pentafluoroprop-tetrafluoroeth (GEBAUERS) aerosol   midazolam  (VERSED ) 5 MG/5ML injection 0.5-2 mg    Make sure Flumazenil is available in the pyxis when using this medication. If oversedation occurs, administer 0.2 mg IV over 15 sec.  If after 45 sec no response, administer 0.2 mg again over 1 min; may repeat at 1 min intervals; not to exceed 4 doses (1 mg)   fentaNYL  (SUBLIMAZE ) injection 25-50 mcg    Make sure Narcan is available in the pyxis when using this medication. In the event of respiratory  depression (RR< 8/min): Titrate NARCAN (naloxone) in increments of 0.1 to 0.2 mg IV at 2-3 minute intervals, until desired degree of reversal.   ropivacaine  (PF) 2 mg/mL (0.2%) (NAROPIN ) injection 9 mL   triamcinolone  acetonide (KENALOG -40) injection 40 mg   HYDROcodone -acetaminophen  (NORCO/VICODIN) 5-325 MG tablet    Sig: Take 1 tablet by mouth every 8 (eight) hours as needed for up to 7 days for severe pain (pain score 7-10). Must last 7 days.    Dispense:  21 tablet    Refill:  0    For acute post-operative pain. Not to be refilled. Must last 7 days.   HYDROcodone -acetaminophen  (NORCO/VICODIN) 5-325 MG tablet    Sig: Take 1 tablet by mouth every 8 (eight) hours as needed for up to 7 days for severe pain (pain score 7-10). Must last for 7 days.    Dispense:  21 tablet    Refill:  0    For acute post-operative pain. Not to be refilled. Must last 7 days.   Medications administered: We administered lidocaine , pentafluoroprop-tetrafluoroeth, midazolam , fentaNYL , ropivacaine  (PF) 2 mg/mL (0.2%), and triamcinolone  acetonide.  See the medical record for exact dosing, route, and time of administration.    Interventional Therapies  Risk Factors  Considerations:  MO (BMI>30)  OSA  SOB  Cerebrovascular malformation  GERD  HTN     Planned  Pending:      Under consideration:   Therapeutic/palliative bilateral L-FCT RFA #1  Therapeutic left CESI #2    Completed:   Therapeutic/palliative bilateral lumbar facet MBB x5 (04/13/2024) (100/100/100 x1 week/0)  Diagnostic/therapeutic left IA glenohumeral + AC joint inj. + subacromial bursa inj. x1 (01/09/2023) (100/100/70/70 x 4 months) Therapeutic left cervical ESI x1 (11/26/2022) (75/75/50/50) (Neck: 100  (L) shoulder: 50)  Therapeutic/palliative bilateral lumbar facet MBB x4 (06/19/2023) (100/100/100/100 x 6 months)  Therapeutic right L4-5 LESI x1 (07/27/2020)  Referral to bariatric surgery and medical weight management entered on 07/18/2020.     Completed by other providers:   EEG x2 by Dr. Jannett Fairly (07/03/14, 07/29/14) - WNL  Diagnostic left occipital nerve block x1 (08/10/2014) by Dr. Jannett Fairly [facility] neurology)  Therapeutic right rotator cuff repair x1 (06/14/2015) by HILARIO WENDI Glenn MD [facility] orthopedics)  Therapeutic left IA Synvisc knee injection x2 (10/02/2020) by Morene Redell Sharps, PA [facility] orthopedics and PMR) Therapeutic left total knee replacement x1 (03/06/2021) by Dr. Ozell Flake [facility] orthopedics)   Therapeutic  Palliative (PRN) options:   Palliative bilateral lumbar facet MBB #4  Therapeutic right L4-5 LESI #2       Follow-up plan:   Return in about 6 weeks (around 08/26/2024) for (Face2F), (PPE).     Recent Visits Date Type Provider Dept  06/17/24 Procedure visit Tanya Glisson, MD [facility]  05/31/24 Office Visit Tanya Glisson, MD [facility]  Showing recent visits within past 90 days and meeting all other requirements Today's Visits Date Type Provider Dept  07/15/24 Procedure visit Tanya Glisson, MD [facility]  Showing today's visits and meeting all other requirements Future Appointments Date Type Provider Dept  08/30/24 Appointment Tanya Glisson, MD [facility]  Showing future appointments within next 90 days and  meeting all other requirements   Disposition: Discharge home  Discharge (Date  Time): 07/15/2024; 1121 hrs.   Primary Care Physician: Dineen Rollene MATSU, FNP Location: [facility] Note by: Eric DELENA Como, MD (TTS technology used. I apologize for any typographical errors that were not detected and corrected.) Date: 07/15/2024; Time: 11:57 AM  Disclaimer:  Medicine is not an Visual merchandiser. The only guarantee in medicine is that nothing is guaranteed. It is important to note that the decision to proceed with this intervention was based on the information collected from the patient. The Data and  conclusions were drawn from the patient's questionnaire, the interview, and the physical examination. Because the information was provided in large part by the patient, it cannot be guaranteed that it has not been purposely or unconsciously manipulated. Every effort has been made to obtain as much relevant data as possible for this evaluation. It is important to note that the conclusions that lead to this procedure are derived in large part from the available data. Always take into account that the treatment will also be dependent on availability of resources and existing treatment guidelines, considered by other Pain Management Practitioners as being common knowledge and practice, at the time of the intervention. For Medico-Legal purposes, it is also important to point out that variation in procedural techniques and pharmacological choices are the acceptable norm. The indications, contraindications, technique, and results of the above procedure should only be interpreted and judged by a Board-Certified Interventional Pain Specialist with extensive familiarity and expertise in the same exact procedure and technique.

## 2024-07-16 ENCOUNTER — Telehealth: Payer: Self-pay | Admitting: *Deleted

## 2024-07-16 NOTE — Telephone Encounter (Signed)
 Called for post procedure follow-up. Message left.

## 2024-07-19 ENCOUNTER — Other Ambulatory Visit: Payer: Self-pay | Admitting: Family

## 2024-07-19 NOTE — Telephone Encounter (Signed)
 Copied from CRM [id number]. Topic: Clinical - Medication Refill >> Jul 19, 2024 10:51 AM Jacqueline Delgado wrote: Medication: mounjaro   Has the patient contacted their pharmacy? Yes (Agent: If no, request that the patient contact the pharmacy for the refill. If patient does not wish to contact the pharmacy document the reason why and proceed with request.) (Agent: If yes, when and what did the pharmacy advise?)  This is the patient's preferred pharmacy:  [id number] [place], [place] - [name] [address] Phone: [phone] Fax: [phone]   Is this the correct pharmacy for this prescription? Yes If no, delete pharmacy and type the correct one.   Has the prescription been filled recently? Yes  Is the patient out of the medication? Yes  Has the patient been seen for an appointment in the last year OR does the patient have an upcoming appointment? Yes  Can we respond through MyChart? Yes  Agent: Please be advised that Rx refills may take up to 3 business days. We ask that you follow-up with your pharmacy.

## 2024-07-20 ENCOUNTER — Ambulatory Visit: Admitting: Pain Medicine

## 2024-07-21 MED ORDER — MOUNJARO 12.5 MG/0.5ML ~~LOC~~ SOAJ: 12.5000 mg | SUBCUTANEOUS | 0 refills | Status: AC

## 2024-07-26 ENCOUNTER — Ambulatory Visit: Admitting: Family

## 2024-07-29 ENCOUNTER — Telehealth: Admitting: Family

## 2024-07-29 ENCOUNTER — Encounter: Payer: Self-pay | Admitting: Family

## 2024-07-29 VITALS — BP 144/80

## 2024-07-29 DIAGNOSIS — N3281 Overactive bladder: Secondary | ICD-10-CM | POA: Diagnosis not present

## 2024-07-29 DIAGNOSIS — E876 Hypokalemia: Secondary | ICD-10-CM | POA: Diagnosis not present

## 2024-07-29 DIAGNOSIS — I10 Essential (primary) hypertension: Secondary | ICD-10-CM | POA: Diagnosis not present

## 2024-07-29 DIAGNOSIS — N951 Menopausal and female climacteric states: Secondary | ICD-10-CM | POA: Diagnosis not present

## 2024-07-29 DIAGNOSIS — R42 Dizziness and giddiness: Secondary | ICD-10-CM

## 2024-07-29 MED ORDER — LOSARTAN POTASSIUM 100 MG PO TABS: 50.0000 mg | ORAL_TABLET | Freq: Every day | ORAL | 3 refills | Status: DC

## 2024-07-29 MED ORDER — TROSPIUM CHLORIDE 20 MG PO TABS: 20.0000 mg | ORAL_TABLET | Freq: Every day | ORAL | 2 refills | Status: DC

## 2024-07-29 MED ORDER — LOSARTAN POTASSIUM 25 MG PO TABS: 12.5000 mg | ORAL_TABLET | Freq: Every day | ORAL | 0 refills | Status: DC | PRN

## 2024-07-29 NOTE — Patient Instructions (Addendum)
 Take an additional losartan  12.5mg  if blood pressure greater than 140/80 ; take losartan  50mg  daily.   For dizziness:  Plenty of water.   Compression leggings  Please take frequent breaks and allow yourself to sit down.   BEFORE YOU START MEDICATION ( TROSPIUM) FOR OVERACTIVE BLADDER, you MUST bring in urine prior too.  THIS IS IMPORTANT  Labs, urine tomorrow here  Chest xray to be done at Outpatient imaging   Please go today or in the next couple of days to :  [facility] off of [address] [facility]  [address]  [phone]   No Appointment needed; you may walk in M-F , 8-5pm

## 2024-07-29 NOTE — Assessment & Plan Note (Signed)
 No B symptoms.  Pending CXR.  Lab Results  Component Value Date   TSH 1.212 05/16/2024   Consider gabapentin. Will follow.

## 2024-07-29 NOTE — Progress Notes (Signed)
 Virtual Visit via Video Note  I connected with Jacqueline Delgado on 07/29/24 at  3:30 PM EDT by a video enabled telemedicine application and verified that I am speaking with the correct person using two identifiers. Location patient: home Location provider: work  Persons participating in the virtual visit: patient, provider  I discussed the limitations of evaluation and management by telemedicine and the availability of in person appointments. The patient expressed understanding and agreed to proceed.  HPI: Discussed the use of AI scribe software for clinical note transcription with the patient, who gave verbal consent to proceed.  History of Present Illness   Jacqueline Delgado is a 73 year old female with hypertension who presents with dizziness and blood pressure concerns.  She experiences dizziness described as a lightheaded sensation without vertigo, occurring more frequently when standing for long periods or bending over. There have been no episodes of syncope. No associated chest pain, shortness of breath, or palpitations. She mentions a history of sinus pressure and cold symptoms, but no significant nasal discharge.  Her hypertension is managed with losartan  50 mg daily.   She has a history of urinary tract infections and was treated with Keflex  in August after a positive urine culture. Endorses nocturia. No h/o sleep apnea  She is postmenopausal and experiences night sweats, on which her tshirt is wet. The sheets are not soaked. No fever, chills, unintentional weight loss.  Her mammogram and colonoscopy are up to date.  She has been on Mounjaro  and is satisfied with her current weight, which is stable at 170 lbs. She has a history of low potassium levels, which have normalized recently.She is no longer on potassium.    Nasal congestion has improved zyrtec     US  carotid 06/2023 Color duplex indicates minimal homogeneous plaque, with no hemodynamically significant stenosis by duplex  criteria in the extracranial cerebrovascular circulation.  No LOC, syncope, head injury.   Mammogram UTD  She has tried Mybetriq,vesicare  and oxybutynin  in the past.   Seen in the ED 05/16/24  DDx: Dehydration, vasovagal episode, anemia, AKI, electrolyte derangement, UTI. Started keflex  UTI  Urine culture 05/16/2024 with e coli  Echocardiogram 12/2019 Left ventricular ejection fraction, by estimation, is 60 to 65%    Ct a/p 05/16/24 IMPRESSION: The adrenals are unremarkable.  No acute intra-abdominal or intrapelvic process. Sigmoid diverticulosis without evidence of acute diverticulitis. Aortic Atherosclerosis (ICD10-I70.0).   ROS: See pertinent positives and negatives per HPI.  EXAM:  VITALS per patient if applicable: BP (!) 144/80 Comment: at home BP Readings from Last 3 Encounters:  07/29/24 (!) 144/80  07/15/24 (!) 171/98  06/17/24 (!) 163/90   Wt Readings from Last 3 Encounters:  07/15/24 170 lb (77.1 kg)  06/17/24 173 lb (78.5 kg)  05/31/24 175 lb (79.4 kg)    GENERAL: alert, oriented, appears well and in no acute distress  HEENT: atraumatic, conjunttiva clear, no obvious abnormalities on inspection of external nose and ears  NECK: normal movements of the head and neck  LUNGS: on inspection no signs of respiratory distress, breathing rate appears normal, no obvious gross SOB, gasping or wheezing  CV: no obvious cyanosis  MS: moves all visible extremities without noticeable abnormality  PSYCH/NEURO: pleasant and cooperative, no obvious depression or anxiety, speech and thought processing grossly intact  ASSESSMENT AND PLAN: Vasomotor symptoms due to menopause Assessment & Plan: No B symptoms.  Pending CXR.  Lab Results  Component Value Date   TSH 1.212 05/16/2024   Consider gabapentin. Will  follow.   Orders: -     DG Chest 2 View; Future  Essential hypertension Assessment & Plan: Chronic, uncontrolled. Potassium normalized. Continue losartan  50mg   every day. Provided losartan  12.5mg  to take BP > 140/80. Consider spironolactone.  Pending endocrine referral for evaluation of hyperaldosteronism. Fortunately adrenal glands unremarkable in CT a/p 05/16/2024.    Orders: -     Losartan  Potassium; Take 0.5 tablets (50 mg total) by mouth daily.  Dispense: 90 tablet; Refill: 3 -     Losartan  Potassium; Take 0.5 tablets (12.5 mg total) by mouth daily as needed. BP > 140/80  Dispense: 90 tablet; Refill: 0  Hypokalemia -     Ambulatory referral to Endocrinology  Dizziness Assessment & Plan: Dizziness with bending over, long periods of standing. Reassuring echocardiogram 2021 and us  carotid 2024. Discussed hydration, compression leggings.Will obtain orthostatics in office.  f/u in 2 weeks.    OAB (overactive bladder) Assessment & Plan: Acute on chronic. Advised re trial of trospium AFTER urine studies resulted.    Other orders -     Trospium Chloride; Take 1 tablet (20 mg total) by mouth at bedtime.  Dispense: 30 tablet; Refill: 2     -we discussed possible serious and likely etiologies, options for evaluation and workup, limitations of telemedicine visit vs in person visit, treatment, treatment risks and precautions. Pt prefers to treat via telemedicine empirically rather then risking or undertaking an in person visit at this moment.    I discussed the assessment and treatment plan with the patient. The patient was provided an opportunity to ask questions and all were answered. The patient agreed with the plan and demonstrated an understanding of the instructions.   The patient was advised to call back or seek an in-person evaluation if the symptoms worsen or if the condition fails to improve as anticipated.  Advised if desired AVS can be mailed or viewed via MyChart if Mychart user.   Rollene Northern, FNP

## 2024-07-29 NOTE — Assessment & Plan Note (Signed)
 Dizziness with bending over, long periods of standing. Reassuring echocardiogram 2021 and us  carotid 2024. Discussed hydration, compression leggings.Will obtain orthostatics in office.  f/u in 2 weeks.

## 2024-07-29 NOTE — Assessment & Plan Note (Signed)
 Chronic, uncontrolled. Potassium normalized. Continue losartan  50mg  every day. Provided losartan  12.5mg  to take BP > 140/80. Consider spironolactone.  Pending endocrine referral for evaluation of hyperaldosteronism. Fortunately adrenal glands unremarkable in CT a/p 05/16/2024.

## 2024-07-29 NOTE — Assessment & Plan Note (Signed)
 Acute on chronic. Advised re trial of trospium AFTER urine studies resulted.

## 2024-07-30 ENCOUNTER — Other Ambulatory Visit (INDEPENDENT_AMBULATORY_CARE_PROVIDER_SITE_OTHER)

## 2024-07-30 ENCOUNTER — Other Ambulatory Visit: Payer: Self-pay

## 2024-07-30 DIAGNOSIS — I10 Essential (primary) hypertension: Secondary | ICD-10-CM | POA: Diagnosis not present

## 2024-07-30 DIAGNOSIS — R399 Unspecified symptoms and signs involving the genitourinary system: Secondary | ICD-10-CM

## 2024-07-30 NOTE — Addendum Note (Signed)
 Addended by: MARYLEN PRO A on: 07/30/2024 12:18 PM   Modules accepted: Orders

## 2024-07-30 NOTE — Addendum Note (Signed)
 Addended by: MARYLEN PRO A on: 07/30/2024 12:04 PM   Modules accepted: Orders

## 2024-08-01 LAB — URINALYSIS, ROUTINE W REFLEX MICROSCOPIC
Bacteria, UA: NONE SEEN /HPF
Bilirubin Urine: NEGATIVE
Glucose, UA: NEGATIVE
Hgb urine dipstick: NEGATIVE
Hyaline Cast: NONE SEEN /LPF
Ketones, ur: NEGATIVE
Nitrite: NEGATIVE
RBC / HPF: NONE SEEN /HPF (ref 0–2)
Specific Gravity, Urine: 1.026 (ref 1.001–1.035)
WBC, UA: NONE SEEN /HPF (ref 0–5)
pH: >=9 — AB (ref 5.0–8.0)

## 2024-08-01 LAB — URINE CULTURE
MICRO NUMBER:: 17114465
SPECIMEN QUALITY:: ADEQUATE

## 2024-08-01 LAB — MICROSCOPIC MESSAGE

## 2024-08-01 LAB — BASIC METABOLIC PANEL WITH GFR
BUN: 17 mg/dL (ref 7–25)
CO2: 26 mmol/L (ref 20–32)
Calcium: 10 mg/dL (ref 8.6–10.4)
Chloride: 107 mmol/L (ref 98–110)
Creat: 0.95 mg/dL (ref 0.60–1.00)
Glucose, Bld: 56 mg/dL — ABNORMAL LOW (ref 65–99)
Potassium: 3.8 mmol/L (ref 3.5–5.3)
Sodium: 142 mmol/L (ref 135–146)
eGFR: 64 mL/min/1.73m2 (ref >=60)

## 2024-08-05 ENCOUNTER — Ambulatory Visit: Payer: Self-pay | Admitting: Family

## 2024-08-05 DIAGNOSIS — R300 Dysuria: Secondary | ICD-10-CM

## 2024-08-05 MED ORDER — AMOXICILLIN-POT CLAVULANATE 875-125 MG PO TABS: 1.0000 | ORAL_TABLET | Freq: Two times a day (BID) | ORAL | 0 refills | Status: AC

## 2024-08-13 ENCOUNTER — Other Ambulatory Visit

## 2024-08-13 DIAGNOSIS — R300 Dysuria: Secondary | ICD-10-CM

## 2024-08-13 NOTE — Addendum Note (Signed)
 Addended by: MARYLEN PRO A on: 08/13/2024 02:14 PM   Modules accepted: Orders

## 2024-08-15 LAB — URINE CULTURE
MICRO NUMBER:: 17176014
SPECIMEN QUALITY:: ADEQUATE

## 2024-08-15 LAB — URINALYSIS, ROUTINE W REFLEX MICROSCOPIC
Bilirubin Urine: NEGATIVE
Glucose, UA: NEGATIVE
Hgb urine dipstick: NEGATIVE
Hyaline Cast: NONE SEEN /LPF
Ketones, ur: NEGATIVE
Nitrite: NEGATIVE
Specific Gravity, Urine: 1.028 (ref 1.001–1.035)
WBC, UA: >=60 /HPF — AB (ref 0–5)
pH: <=5 — AB (ref 5.0–8.0)

## 2024-08-16 ENCOUNTER — Ambulatory Visit: Payer: Self-pay | Admitting: Family

## 2024-08-16 DIAGNOSIS — N3000 Acute cystitis without hematuria: Secondary | ICD-10-CM

## 2024-08-16 MED ORDER — SULFAMETHOXAZOLE-TRIMETHOPRIM 800-160 MG PO TABS: 1.0000 | ORAL_TABLET | Freq: Two times a day (BID) | ORAL | 0 refills | Status: DC

## 2024-08-17 ENCOUNTER — Telehealth: Payer: Self-pay

## 2024-08-17 DIAGNOSIS — R899 Unspecified abnormal finding in specimens from other organs, systems and tissues: Secondary | ICD-10-CM

## 2024-08-17 NOTE — Telephone Encounter (Signed)
 Copied from CRM #[id number]. Topic: Clinical - Prescription Issue >> Aug 17, 2024 10:06 AM China J wrote: Reason for CRM: The pharmacy is holding the patient's sulfamethoxazole -trimethoprim  (BACTRIM  DS) 800-160 MG tablet because they had seen that she is taking losartin which they stated could lead to adverse reactions when taken together. The patient is wanting to speak with her provider directly.    She also let me know that she works at 2:00 PM and is willing to wait to speak with Rollene until tomorrow if the clinic cannot get to her by today.  Please call [phone].

## 2024-08-18 ENCOUNTER — Other Ambulatory Visit: Payer: Self-pay | Admitting: Family

## 2024-08-18 DIAGNOSIS — N3000 Acute cystitis without hematuria: Secondary | ICD-10-CM

## 2024-08-18 MED ORDER — AMOXICILLIN-POT CLAVULANATE 875-125 MG PO TABS: 1.0000 | ORAL_TABLET | Freq: Two times a day (BID) | ORAL | 0 refills | Status: DC

## 2024-08-18 NOTE — Telephone Encounter (Signed)
 noted

## 2024-08-18 NOTE — Telephone Encounter (Signed)
 Spoke to her and informed her of new rx that was sent in and also to take probiotics since she has been on an antibiotic constantly, and she will come back in 2 weeks to get urine tested again does not want to be referred to Urology as of now

## 2024-08-23 NOTE — Telephone Encounter (Signed)
 Noted, please order repeat urinalysis

## 2024-08-24 ENCOUNTER — Other Ambulatory Visit: Payer: Self-pay | Admitting: Family

## 2024-08-24 DIAGNOSIS — N3000 Acute cystitis without hematuria: Secondary | ICD-10-CM

## 2024-08-24 MED ORDER — AMOXICILLIN-POT CLAVULANATE 875-125 MG PO TABS: 1.0000 | ORAL_TABLET | Freq: Two times a day (BID) | ORAL | 0 refills | Status: AC

## 2024-08-24 NOTE — Telephone Encounter (Unsigned)
 Copied from CRM [id number]. Topic: General - Other >> Aug 24, 2024  8:30 AM Rosina BIRCH wrote: Reason for CRM: patient called stating the provider called her in some amoxicillin  and she took it for few days and then she was instructed to come back and take another urine test. Patient stated she was told the bacteria had changed, so the doctor called her in another medication-sulfamethoxazole . The pharmacy stated this medication-sulfamethoxazole  will interact with her losartan , so the provider stated she was going to change it but it was the same medication she had before. Patient want to know what she need to take  [phone]

## 2024-08-24 NOTE — Telephone Encounter (Signed)
 Call patient I am sorry for the confusion.  I had refilled amoxicillin /clavulanate on 08/18/24  Due to the drug interaction with Bactrim , I think it is safest to prescribe augmentin  course again.   I have refilled Augmentin  to trigger a refill from pharmacy.  Due to the profound number of white blood cells in her urine, please reorder urinalysis and schedule in the next 2 weeks

## 2024-08-24 NOTE — Telephone Encounter (Signed)
 LVM to call back to inform pt of below message per Rollene

## 2024-08-24 NOTE — Addendum Note (Signed)
 Addended by: Jasmyne Lodato on: 08/24/2024 08:07 AM   Modules accepted: Orders

## 2024-08-25 NOTE — Telephone Encounter (Unsigned)
 Copied from CRM [id number]. Topic: General - Other >> Aug 24, 2024  8:30 AM Jacqueline Delgado wrote: Reason for CRM: patient called stating the provider called her in some amoxicillin  and she took it for few days and then she was instructed to come back and take another urine test. Patient stated she was told the bacteria had changed, so the doctor called her in another medication-sulfamethoxazole . The pharmacy stated this medication-sulfamethoxazole  will interact with her losartan , so the provider stated she was going to change it but it was the same medication she had before. Patient want to know what she need to take  [phone] >> Aug 25, 2024  2:02 PM Jacqueline Delgado wrote: Patient is calling to follow up with Jenate  on a conversation they had yesterday, I advised of the correction messaged left by Rollene Northern; however, patient is still a bit confused as she still has two bottles of Augmentin   and would like to speak with Jenate today if possible.

## 2024-08-25 NOTE — Telephone Encounter (Signed)
 Spoke to pt cleared up confusion on medication pt will continue with the Augmentin  as prescribed

## 2024-08-30 ENCOUNTER — Ambulatory Visit: Attending: Pain Medicine | Admitting: Pain Medicine

## 2024-08-30 ENCOUNTER — Encounter: Payer: Self-pay | Admitting: Pain Medicine

## 2024-08-30 VITALS — BP 159/84 | HR 65 | Temp 98.1°F | Resp 16 | Ht 63.0 in | Wt 164.0 lb

## 2024-08-30 DIAGNOSIS — M545 Low back pain, unspecified: Secondary | ICD-10-CM | POA: Diagnosis present

## 2024-08-30 DIAGNOSIS — M25562 Pain in left knee: Secondary | ICD-10-CM | POA: Diagnosis present

## 2024-08-30 DIAGNOSIS — M47817 Spondylosis without myelopathy or radiculopathy, lumbosacral region: Secondary | ICD-10-CM | POA: Insufficient documentation

## 2024-08-30 DIAGNOSIS — Z96652 Presence of left artificial knee joint: Secondary | ICD-10-CM | POA: Diagnosis present

## 2024-08-30 DIAGNOSIS — M5459 Other low back pain: Secondary | ICD-10-CM | POA: Insufficient documentation

## 2024-08-30 DIAGNOSIS — Z09 Encounter for follow-up examination after completed treatment for conditions other than malignant neoplasm: Secondary | ICD-10-CM | POA: Insufficient documentation

## 2024-08-30 DIAGNOSIS — G8929 Other chronic pain: Secondary | ICD-10-CM | POA: Diagnosis present

## 2024-08-30 NOTE — Progress Notes (Signed)
 Safety precautions to be maintained throughout the outpatient stay will include: orient to surroundings, keep bed in low position, maintain call bell within reach at all times, provide assistance with transfer out of bed and ambulation.

## 2024-08-30 NOTE — Patient Instructions (Signed)
 ______________________________________________________________________    Procedure instructions  Stop blood-thinners  Do not eat or drink fluids (other than water) for 6 hours before your procedure  No water for 2 hours before your procedure  Take your blood pressure medicine with a sip of water  Arrive 30 minutes before your appointment  If sedation is planned, bring suitable driver. [place], [place], & public transportation are NOT APPROVED)  Carefully read the Preparing for your procedure detailed instructions  If you have questions call us  at [phone]  Procedure appointments are for procedures only.   NO medication refills or new problem evaluations will be done on procedure days.   Only the scheduled, pre-approved procedure and side will be done.   ______________________________________________________________________     ______________________________________________________________________    Preparing for your procedure  Appointments: If you think you may not be able to keep your appointment, call 24-48 hours in advance to cancel. We need time to make it available to others.  Procedure visits are for procedures only. During your procedure appointment there will be: NO Prescription Refills*. NO medication changes or discussions*. NO discussion of disability issues*. NO unrelated pain problem evaluations*. NO evaluations to order other pain procedures*. *These will be addressed at a separate and distinct evaluation encounter on the provider's evaluation schedule and not during procedure days.  Instructions: Food intake: Avoid eating anything solid for at least 8 hours prior to your procedure. Clear liquid intake: You may take clear liquids such as water up to 2 hours prior to your procedure. (No carbonated drinks. No soda.) Transportation: Unless otherwise stated by your physician, bring a driver. (Driver cannot be a Market Researcher, Pharmacist, Community, or any other form of public  transportation.) Morning Medicines: Except for blood thinners, take all of your other morning medications with a sip of water. Make sure to take your heart and blood pressure medicines. If your blood pressure's lower number is above 100, the case will be rescheduled. Blood thinners: Make sure to stop your blood thinners as instructed.  If you take a blood thinner, but were not instructed to stop it, call our office [phone] and ask to talk to a nurse. Not stopping a blood thinner prior to certain procedures could lead to serious complications. Diabetics on insulin: Notify the staff so that you can be scheduled 1st case in the morning. If your diabetes requires high dose insulin, take only  of your normal insulin dose the morning of the procedure and notify the staff that you have done so. Preventing infections: Shower with an antibacterial soap the morning of your procedure.  Build-up your immune system: Take 1000 mg of Vitamin C with every meal (3 times a day) the day prior to your procedure. Antibiotics: Inform the nursing staff if you are taking any antibiotics or if you have any conditions that may require antibiotics prior to procedures. (Example: recent joint implants)   Pregnancy: If you are pregnant make sure to notify the nursing staff. Not doing so may result in injury to the fetus, including death.  Sickness: If you have a cold, fever, or any active infections, call and cancel or reschedule your procedure. Receiving steroids while having an infection may result in complications. Arrival: You must be in the facility at least 30 minutes prior to your scheduled procedure. Tardiness: Your scheduled time is also the cutoff time. If you do not arrive at least 15 minutes prior to your procedure, you will be rescheduled.  Children: Do not bring any children with  you. Make arrangements to keep them home. Dress appropriately: There is always a possibility that your clothing may get soiled. Avoid  long dresses. Valuables: Do not bring any jewelry or valuables.  Reasons to call and reschedule or cancel your procedure: (Following these recommendations will minimize the risk of a serious complication.) Surgeries: Avoid having procedures within 2 weeks of any surgery. (Avoid for 2 weeks before or after any surgery). Flu Shots: Avoid having procedures within 2 weeks of a flu shots or . (Avoid for 2 weeks before or after immunizations). Barium: Avoid having a procedure within 7-10 days after having had a radiological study involving the use of radiological contrast. (Myelograms, Barium swallow or enema study). Heart attacks: Avoid any elective procedures or surgeries for the initial 6 months after a Myocardial Infarction (Heart Attack). Blood thinners: It is imperative that you stop these medications before procedures. Let us  know if you if you take any blood thinner.  Infection: Avoid procedures during or within two weeks of an infection (including chest colds or gastrointestinal problems). Symptoms associated with infections include: Localized redness, fever, chills, night sweats or profuse sweating, burning sensation when voiding, cough, congestion, stuffiness, runny nose, sore throat, diarrhea, nausea, vomiting, cold or Flu symptoms, recent or current infections. It is specially important if the infection is over the area that we intend to treat. Heart and lung problems: Symptoms that may suggest an active cardiopulmonary problem include: cough, chest pain, breathing difficulties or shortness of breath, dizziness, ankle swelling, uncontrolled high or unusually low blood pressure, and/or palpitations. If you are experiencing any of these symptoms, cancel your procedure and contact your primary care physician for an evaluation.  Remember:  Regular Business hours are:  Monday to Thursday 8:00 AM to 4:00 PM  Provider's Schedule: Eric Como, MD:  Procedure days: Tuesday and Thursday 7:30  AM to 4:00 PM  Wallie Sherry, MD:  Procedure days: Monday and Wednesday 7:30 AM to 4:00 PM Last  Updated: 09/23/2023 ______________________________________________________________________     ______________________________________________________________________    General Risks and Possible Complications  Patient Responsibilities: It is important that you read this as it is part of your informed consent. It is our duty to inform you of the risks and possible complications associated with treatments offered to you. It is your responsibility as a patient to read this and to ask questions about anything that is not clear or that you believe was not covered in this document.  Patient's Rights: You have the right to refuse treatment. You also have the right to change your mind, even after initially having agreed to have the treatment done. However, under this last option, if you wait until the last second to change your mind, you may be charged for the materials used up to that point.  Introduction: Medicine is not an visual merchandiser. Everything in Medicine, including the lack of treatment(s), carries the potential for danger, harm, or loss (which is by definition: Risk). In Medicine, a complication is a secondary problem, condition, or disease that can aggravate an already existing one. All treatments carry the risk of possible complications. The fact that a side effects or complications occurs, does not imply that the treatment was conducted incorrectly. It must be clearly understood that these can happen even when everything is done following the highest safety standards.  No treatment: You can choose not to proceed with the proposed treatment alternative. The "PRO(s)" would include: avoiding the risk of complications associated with the therapy. The "CON(s)" would include:  not getting any of the treatment benefits. These benefits fall under one of three categories: diagnostic; therapeutic; and/or  palliative. Diagnostic benefits include: getting information which can ultimately lead to improvement of the disease or symptom(s). Therapeutic benefits are those associated with the successful treatment of the disease. Finally, palliative benefits are those related to the decrease of the primary symptoms, without necessarily curing the condition (example: decreasing the pain from a flare-up of a chronic condition, such as incurable terminal cancer).  General Risks and Complications: These are associated to most interventional treatments. They can occur alone, or in combination. They fall under one of the following six (6) categories: no benefit or worsening of symptoms; bleeding; infection; nerve damage; allergic reactions; and/or death. No benefits or worsening of symptoms: In Medicine there are no guarantees, only probabilities. No healthcare provider can ever guarantee that a medical treatment will work, they can only state the probability that it may. Furthermore, there is always the possibility that the condition may worsen, either directly, or indirectly, as a consequence of the treatment. Bleeding: This is more common if the patient is taking a blood thinner, either prescription or over the counter (example: Goody Powders, Fish oil, Aspirin, Garlic, etc.), or if suffering a condition associated with impaired coagulation (example: Hemophilia, cirrhosis of the liver, low platelet counts, etc.). However, even if you do not have one on these, it can still happen. If you have any of these conditions, or take one of these drugs, make sure to notify your treating physician. Infection: This is more common in patients with a compromised immune system, either due to disease (example: diabetes, cancer, human immunodeficiency virus [HIV], etc.), or due to medications or treatments (example: therapies used to treat cancer and rheumatological diseases). However, even if you do not have one on these, it can still  happen. If you have any of these conditions, or take one of these drugs, make sure to notify your treating physician. Nerve Damage: This is more common when the treatment is an invasive one, but it can also happen with the use of medications, such as those used in the treatment of cancer. The damage can occur to small secondary nerves, or to large primary ones, such as those in the spinal cord and brain. This damage may be temporary or permanent and it may lead to impairments that can range from temporary numbness to permanent paralysis and/or brain death. Allergic Reactions: Any time a substance or material comes in contact with our body, there is the possibility of an allergic reaction. These can range from a mild skin rash (contact dermatitis) to a severe systemic reaction (anaphylactic reaction), which can result in death. Death: In general, any medical intervention can result in death, most of the time due to an unforeseen complication. ______________________________________________________________________      ______________________________________________________________________    Steroid injections  Common steroids for injections Triamcinolone : Used by many sports medicine physicians for large joint and bursal injections, often combined with a local anesthetic like lidocaine . A study focusing on coccydynia (tailbone pain) found triamcinolone  was more effective than betamethasone, suggesting it may also be preferable for other localized inflammation conditions. Methylprednisolone : A common alternative to triamcinolone  that is also a strong anti-inflammatory. It is available in different formulations, with the acetate suspension being the long-acting option for intra-articular injections. Dexamethasone : This is a non-particulate steroid, meaning it has a lower risk of tissue damage compared to particulate steroids like triamcinolone  and methylprednisolone . While less common for this specific  use,  it is an option for targeted injections.   Considerations for physicians Particulate vs. non-particulate steroids: Triamcinolone  and methylprednisolone  are particulate, meaning they can clump together. Dexamethasone  is non-particulate. Particulate steroids are often preferred for their longer-lasting effects but carry a theoretical higher risk for certain injections (though this is less of a concern in the costochondral joints). Combined injectate: Corticosteroids are typically mixed with a local anesthetic like lidocaine  to provide both immediate pain relief (from the anesthetic) and longer-term inflammation reduction (from the steroid). Imaging guidance: To ensure accurate placement of the needle and medication, physicians may use ultrasound or fluoroscopic guidance for the injection, especially in complex or refractory cases.   Patient guidance Before undergoing a steroid injection, discuss the options with your physician. They will determine the best steroid, dosage, and procedure for your specific case based on factors like: Severity of your condition History of response to other treatments Your overall health status Experience and preference of the physician  Last  Updated: 06/08/2024 ______________________________________________________________________     ______________________________________________________________________    Pain Prevention Technique  Definition:   A technique used to minimize the effects of an activity known to cause inflammation or swelling, which in turn leads to an increase in pain.  Purpose: To prevent swelling from occurring. It is based on the fact that it is easier to prevent swelling from happening than it is to get rid of it, once it occurs.  Contraindications: Anyone with allergy or hypersensitivity to the recommended medications. Anyone taking anticoagulants (Blood Thinners) (e.g., Coumadin, Warfarin, Plavix, etc.). Patients in Renal Failure  or having chronic kidney disease.  Technique: Before you undertake an activity known to cause pain, or a flare-up of your chronic pain, and before you experience any pain, do the following:  On a full stomach, take 4 (four) over the counter Ibuprofens 200mg  tablets (Motrin ), for a total of 800 mg. In addition, take over the counter Magnesium  400 to 500 mg, before doing the activity.  Six (6) hours later, again on a full stomach, repeat the Ibuprofen . That night, take a warm shower and stretch under the running warm water.  This technique may be sufficient to abort the pain and discomfort before it happens. Keep in mind that it takes a lot less medication to prevent swelling than it takes to eliminate it once it occurs.   Last Update: 04/24/2023  ______________________________________________________________________    ______________________________________________________________________    TENS (Device can be purchased online, without prescription. Search: TENS 7000.) Transcutaneous electrical nerve stimulation (TENS) is a method of pain relief that involves the use of mild electrical stimulation. A TENS machine is a small, battery-operated device that has leads connected to sticky pads called electrodes. Available at [employer]. (Estimated price as of July 10th, 2025.)  (Estimated [employer] cost: $38.88) Rechargeable 9V batteries:  (Estimated [employer] cost: $12.98)  Larger Reusable 2 x 4 TENS Pads/Electrodes:  (Estimated [employer] cost: $9.99)  Total cost: $61.85    ELECTRODE PLACEMENT:   TENS UNIT SAFETY WARNING SHEET and INFORMATION INDICATIONS AND CONTRAINDICTIONS Read the operation manual before using the device. Freight forwarder ([place] ) restricts this device to sale by or on the order of a physician. Observe your physician's precise instructions and let him show you where to apply the electrodes. For a successful therapy, the correct application of the electrodes is an important factor.  Carefully write down the settings your physician recommended. Indications for use This device is a prescription device and only for symptomatic relief of chronic intractable pain.  Contraindications:   Any electrode placement that applies current to the carotid sinus (neck) region.   Patients with implanted electronic devices (for example, a pacemaker) or metallic implants should not undertake.   Any electrode placement that causes current to flow transcerebrally (through the head). The use of unit whenever pain symptoms are undiagnosed, unit etiology is determined.   The use of TENS whenever pain syndromes are undiagnosed, until etiology is established.   WARNINGS AND PRECAUTIONS  Warnings:   The device must be kept out of reach of children.   The safety of device for use during pregnancy or delivery has not been established.   Do not place electrodes on front of the throat. This may result in spasms of the laryngeal and pharyngeal muscles.   Do not place the electrodes over the carotid nerve (side of neck below ear).   The device is not effective for pain of central origin (headaches).   The device may interfere with electronic monitoring equipment (such as ECG monitors and ECG alarms).   Electrodes should not be placed over the eyes, in the mouth, or internally.   These devices have no curative value.   TENS devices should be used only under the continued supervision of a physician.   TENS is a symptomatic treatment and as such suppresses the sensation of pain which would otherwise serve as a protective mechanism. Precautions/Adverse Reactions   Isolated cases of skin irritation may occur at the site of electrode placement following long-term application.   Stimulation should be stopped and electrodes removed until the cause of the irritation can be determined.   Effectiveness is highly dependent upon patient selection by a person qualified in the management of pain patients.   If the device treatment  becomes ineffective or unpleasant, stimulation should be discontinued until reevaluation by a physician/clinician.   Always turn the device off before applying or removing electrodes.   Skin irritation and electrode burns are potential adverse reactions.  PURPOSE: A Transcutaneous Electrical Nerve Stimulator, or TENS, unit is designed to relieve post-operative, acute and chronic pain. It is used for pain caused by peripheral nerves and not central. TENS units are prescription-only devices.   OPERATION: TENS units work in a couple of ways. The first way they are thought to work is by a method called the [employer]. The [employer] states that our brains can only handle one stimulus at a time. When you have chronic pain, this pain signal is constantly being sent to your brain and recognized as pain. When an electrical stimulus is added to the area of pain the body feels this electrical stimulus, and since the brain can only handle one thing at a time, the pain is not transmitted to the brain. The second method thought to be part of TENS unit's success is by way of stimulating our own bodies to release their own natural painkillers. TENS units do not work for everyone and results may vary. Always follow the instructions and warnings in your user's manual.   USE: One of the most important tasks that must be performed is battery maintenance. If you are using a engineering geologist, always fully charge it and fully deplete it before charging it again. These batteries can develop memories and by not performing this charging task correctly, your battery's life can be greatly diminished. If your battery does develop a memory you can help expand the memory by charging for 12 - 13 hours and then completely depleting the battery.  Always prepare the skin before applying electrodes. Your skin should be clean and free of any lotions or creams. If you are using electrodes that use conductive gel, apply a  small, even layer over the electrode. For carbon, self-adhesive electrodes, apply a drop of water to the electrodes before applying to the skin. The electrodes attach to the lead wires and then the TENS unit. Always grasp the connector and not the cord when inserting or removing. When making adjustments, always make sure the unit's channels (1 and 2) are in the OFF position. The actual settings should be recommended and prescribed by your physician. Medical equipment suppliers don'tset or instruct users as to user settings. When you are using the BURST mode, the unit delivers a series of quick pulses followed by a rest. This cycle repeats itself frequently. Always have channels OFF before changing modes.   For MODULATION mode, the stimulation automatically varies the width of the pulse.   For CONVENTIONAL mode, the stimulation is constant. After the settings have been fine-tuned, set the timer to 30 or 60 minutes. Your physician should also prescribe the use time. When the lights become dim, it means your batteries should be replaced or recharged.   ACCESSORIES: The electrodes and lead wires can be obtained from your medical equipment supplier. Your medical equipment supplier can set up a recurring delivery to accommodate your needs. Electrodes should be replaced once a month and lead wires once every 6 months.  Video Tutorial [email or web address]    ______________________________________________________________________

## 2024-08-30 NOTE — Progress Notes (Signed)
 PROVIDER NOTE: Interpretation of information contained herein should be left to medically-trained personnel. Specific patient instructions are provided elsewhere under Patient Instructions section of medical record. This document was created in part using AI and STT-dictation technology, any transcriptional errors that may result from this process are unintentional.  Patient: Jacqueline Delgado  Service: E/M   PCP: Jacqueline Rollene MATSU, FNP  DOB: 1950/12/29  DOS: 08/30/2024  Provider: Eric DELENA Como, MD  MRN: [id number]  Delivery: Face-to-face  Specialty: Interventional Pain Management  Type: Established Patient  Setting: Ambulatory outpatient facility  Specialty designation: 09  Referring Prov.: Jacqueline Rollene MATSU, FNP  Location: Outpatient office facility       History of present illness (HPI) [name], a 73 y.o. year old female, is here today because of her Chronic bilateral low back pain without sciatica [M54.50, G89.29]. [name] primary complain today is Back Pain (Mid medial )  Pertinent problems: [name] has Localized swelling of finger of left hand; Acquired trigger finger; Closed nondisplaced fracture of base of fifth metacarpal bone of right hand; DDD (degenerative disc disease), cervical; DDD (degenerative disc disease), lumbar; Chronic low back pain (1ry area of Pain) (Bilateral) (R>L) w/ sciatica (Right); Neck pain; Non-cardiac chest pain; S/P rotator cuff repair; Unspecified osteoarthritis, unspecified site; Osteoarthritis of knee (Left); Ankle swelling, right; Chronic pain syndrome; DDD (degenerative disc disease), lumbosacral; Lumbar facet hypertrophy; Abnormal MRI, lumbar spine (08/11/20); Lumbosacral central spinal stenosis (9 mm) (L4-5 and L5-S1); Chronic lower extremity pain (Right); Lumbosacral radiculopathy at L5 (Right); Chronic knee pain (3ry area of Pain) (Left); Swelling of foot (4th area of Pain) (Right); Chronic lower extremity pain (2ry area of Pain)  (Bilateral) (R>L); Calcaneal spur of foot (Right); Lumbar Grade 1 Anterolisthesis of L4/L5; Lumbosacral facet syndrome (Bilateral) (R>L); Chronic low back pain (Bilateral) (R>L) w/o sciatica; Spondylosis without myelopathy or radiculopathy, lumbosacral region; S/P TKR (total knee replacement) using cement, left; Chronic knee pain after total replacement of knee joint (Left); Osteoarthritis of lumbar spine; Osteoarthritis involving multiple joints; Acute midline low back pain without sciatica; Chronic Delgado pain (Multifactorial) (Left); Abnormal MRI, cervical spine (07/25/2022); Cervicalgia; Cervical foraminal stenosis (Severe, Left: C3-4, C4-5) (Bilateral: C5-6, C6-7); Radicular pain of Delgado (Left); Cervical radiculopathy (Left) (C4, C5); Osteoarthritis of Delgado (Left); Osteoarthritis of AC (acromioclavicular) joint (Left); Arthralgia of acromioclavicular joint (Left); Upper arm pain (Left); Subacromial bursitis of Delgado joint (Left); Subdeltoid bursitis of Delgado joint (Left); Subscapularis tendinosis (Left); Supraspinatus tendinosis (Left); Lumbar facet joint pain; Complete tear of rotator cuff (Right); Generalized anxiety disorder; Neck mass; Headache; Low back pain of over 3 months duration; Intermittent low back pain; and Multifactorial low back pain on their pertinent problem list.  Pain Assessment: Severity of Chronic pain is reported as a 1 /10. Location: Back Mid, Medial/Denies. Onset: More than a month ago. Quality: Aching, Discomfort, Sore, Constant. Timing: Constant. Modifying factor(s): Reposition and pain medication. Vitals:  height is 5' 3 (1.6 m) and weight is 164 lb (74.4 kg). Her temporal temperature is 98.1 F (36.7 C). Her blood pressure is 159/84 (abnormal) and her pulse is 65. Her respiration is 16 and oxygen  saturation is 100%.  BMI: Estimated body mass index is 29.05 kg/m as calculated from the following:   Height as of this encounter: 5' 3 (1.6 m).   Weight as of  this encounter: 164 lb (74.4 kg).  Last encounter: 05/31/2024. Last procedure: 07/15/2024.  Reason for encounter: post-procedure evaluation and assessment.   Discussed the use of AI scribe  software for clinical note transcription with the patient, who gave verbal consent to proceed.  History of Present Illness   [name] is a 73 year old female with chronic lower back pain who presents for follow-up after bilateral lumbar facet radiofrequency ablation.  She underwent a left-sided lumbar facet radiofrequency ablation on July 15, 2024, with 100% pain relief during the local anesthetic phase, later decreasing to a 35% improvement. A previous right-sided ablation on June 17, 2024, initially resulted in moderate pain, which improved over time. 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 The pain worsens by the end of her 5 to 6-hour shifts, making ambulation to her car difficult. It is described as soreness, intensified by sitting in her car seat, and relieved by hydrocodone , a heating pad, and sleeping elevated. Working self-checkout results in less intense soreness.  She also experiences left knee pain, particularly on the lateral aspect, which sometimes wakes her at night and feels tight when bending. An x-ray in 2023 showed a bone spur. Occasional instability occurs, especially when standing for long periods at work. Hydrocodone  is taken as needed for pain relief.      Post-Procedure Evaluation   Procedure: Lumbar Facet, Medial Branch Radiofrequency Ablation (RFA) #1  Laterality: Left (-LT)  Level: L2, L3, L4, L5, and S1 Medial Branch Level(s). These levels will denervate the L3-4, L4-5, and L5-S1 lumbar facet joints.  Imaging: Fluoroscopy-guided Spinal ([id number]) Anesthesia: Local anesthesia (1-2% Lidocaine ) Anxiolysis: IV Versed  3.0 mg Sedation: Minimal Sedation Fentanyl  2.0  mL (100 mcg) DOS: 07/15/2024  Performed by: Eric DELENA Como, MD  Purpose: Therapeutic/Palliative Indications: Low back pain severe enough to impact quality of life or function. Indications: 1. Chronic low back pain (Bilateral) (R>L) w/o sciatica   2. Lumbar facet joint pain   3. Lumbar Grade 1 Anterolisthesis of L4/L5   4. Lumbar facet hypertrophy   5. Low back pain of over 3 months duration   6. Lumbosacral facet syndrome (Bilateral) (R>L)   7. Multifactorial low back pain   8. Osteoarthritis of lumbar spine   9. Spondylosis without myelopathy or radiculopathy, lumbosacral region    Pain Score: Pre-procedure: 5 /10 Post-procedure: 0-No pain/10    Effectiveness:  Initial hour after procedure: 100 %. Subsequent 4-6 hours post-procedure: 100 %. Analgesia past initial 6 hours: 35 %. Ongoing improvement:  Analgesic: The patient indicates having attained 100% relief of the pain on the right side, but she is still having some discomfort on the left.  Today she also talked about a new problem with her left knee. Function: [name] reports improvement in function ROM: [name] reports improvement in ROM  Pharmacotherapy Assessment   Opioid Analgesic:  No chronic opioid analgesics therapy prescribed by our practice. Tramadol  50 mg tablet, 1 tab p.o. twice daily (PRN) MME/day: 0-10 mg/day   Monitoring: [place] PMP: PDMP reviewed during this encounter.       Pharmacotherapy: No side-effects or adverse reactions reported. Compliance: No problems identified. Effectiveness: Clinically acceptable.  Bonner Norris, RN  08/30/2024 10:42 AM  Sign when Signing Visit Safety precautions to be maintained throughout the outpatient stay will include: orient to surroundings, keep bed in low position, maintain call bell within reach at all times, provide assistance with transfer out of bed and ambulation.     UDS:  Summary  Date Value Ref Range Status  05/22/2020 Note  Final    Comment:     ====================================================================  Compliance Drug Analysis, Ur ==================================================================== Test                             Result       Flag       Units  Drug Present and Declared for Prescription Verification   Tramadol                        >3268        EXPECTED   ng/mg creat   O-Desmethyltramadol            2372         EXPECTED   ng/mg creat   N-Desmethyltramadol            1509         EXPECTED   ng/mg creat    Source of tramadol  is a prescription medication. O-desmethyltramadol    and N-desmethyltramadol are expected metabolites of tramadol .    Bupropion                       PRESENT      EXPECTED   Hydroxybupropion               PRESENT      EXPECTED    Hydroxybupropion is an expected metabolite of bupropion .  Drug Present not Declared for Prescription Verification   Dextromethorphan                PRESENT      UNEXPECTED   Dextrorphan/Levorphanol        PRESENT      UNEXPECTED    Dextrorphan is an expected metabolite of dextromethorphan , an over-    the-counter or prescription cough suppressant. Dextrorphan cannot be    distinguished from the scheduled prescription medication levorphanol    by the method used for analysis.  ==================================================================== Test                      Result    Flag   Units      Ref Range   Creatinine              153              mg/dL      >=79 ==================================================================== Declared Medications:  The flagging and interpretation on this report are based on the  following declared medications.  Unexpected results may arise from  inaccuracies in the declared medications.   **Note: The testing scope of this panel includes these medications:   Bupropion  (Wellbutrin )  Tramadol  (Ultram )   **Note: The testing scope of this panel does not include the  following reported medications:   Famotidine   (Pepcid )  Hydrochlorothiazide  (Hydrodiuril )  Losartan  (Cozaar )  Meloxicam (Mobic)  Mirabegron  (Myrbetriq )  Rabeprazole  (Aciphex ) ==================================================================== For clinical consultation, please call [phone]. ====================================================================     No results found for: CBDTHCR No results found for: D8THCCBX No results found for: D9THCCBX  ROS  Constitutional: Denies any fever or chills Gastrointestinal: No reported hemesis, hematochezia, vomiting, or acute GI distress Musculoskeletal: Denies any acute onset joint swelling, redness, loss of ROM, or weakness Neurological: No reported episodes of acute onset apraxia, aphasia, dysarthria, agnosia, amnesia, paralysis, loss of coordination, or loss of consciousness  Medication Review  RABEprazole , amoxicillin -clavulanate, famotidine , fluticasone , losartan , ondansetron , tirzepatide , and trospium  History Review  Allergy: [name] has no known allergies. Drug: [name]  reports no history of drug use. Alcohol:  reports no history of alcohol use. Tobacco:  reports that she has never smoked. She has never used smokeless tobacco. Social: Ms. Soule  reports that she has never smoked. She has never used smokeless tobacco. She reports that she does not drink alcohol and does not use drugs. Medical:  has a past medical history of Anemia, Anginal pain, Arthritis, Chronic pain syndrome, Colon polyp, Depression, GERD (gastroesophageal reflux disease), Hiatal hernia, History of hiatal hernia, History of methicillin resistant staphylococcus aureus (MRSA), Hypertension, Obesity, Pre-diabetes, Reflux, Sleep apnea, and Small vessel disease. Surgical: Ms. Picazo  has a past surgical history that includes Joint replacement (Right); Joint replacement; Cholecystectomy; Abdominal hysterectomy; Delgado arthroscopy with rotator cuff repair and subacromial decompression  (Right, 06/14/2015); Esophagogastroduodenoscopy (egd) with propofol  (N/A, 10/11/2015); Savory dilation (N/A, 10/11/2015); Colonoscopy w/ polypectomy; Esophagogastroduodenoscopy (egd) with propofol  (N/A, 12/10/2017); Colonoscopy with propofol  (N/A, 12/10/2017); Total knee arthroplasty (Left, 03/06/2021); Joint replacement (Left); Esophagogastroduodenoscopy (egd) with propofol  (N/A, 05/03/2022); Breast surgery; Carpal tunnel release; Trigger finger release; Colonoscopy with propofol  (N/A, 04/25/2023); and polypectomy (04/25/2023). Family: family history includes Alcoholism in an other family member; Aneurysm (age of onset: 36) in her sister; Arthritis in an other family member; Breast cancer in her maternal grandmother; Breast cancer (age of onset: 43) in her mother; Colon cancer (age of onset: 78) in her sister; Diabetes in an other family member; Heart disease in her father; Hyperlipidemia in her father; Hypertension in her father; Stroke in her father.  Laboratory Chemistry Profile   Renal Lab Results  Component Value Date   BUN 17 07/30/2024   CREATININE 0.95 07/30/2024   BCR SEE NOTE: 07/30/2024   GFR 72.71 03/01/2024   GFRAA 81 09/13/2020   GFRNONAA 51 (L) 05/16/2024    Hepatic Lab Results  Component Value Date   AST 20 05/16/2024   ALT 10 05/16/2024   ALBUMIN 3.3 (L) 05/16/2024   ALKPHOS 82 05/16/2024   LIPASE 36 10/12/2017    Electrolytes Lab Results  Component Value Date   NA 142 07/30/2024   K 3.8 07/30/2024   CL 107 07/30/2024   CALCIUM 10.0 07/30/2024   MG 2.1 04/09/2023    Bone Lab Results  Component Value Date   VD25OH 21.08 (L) 01/29/2023   25OHVITD1 18 (L) 05/22/2020   25OHVITD2 13 05/22/2020   25OHVITD3 5.3 05/22/2020    Inflammation (CRP: Acute Phase) (ESR: Chronic Phase) Lab Results  Component Value Date   CRP 15 (H) 05/22/2020   ESRSEDRATE 73 (H) 05/22/2020         Note: Above Lab results reviewed.  Recent Imaging Review  DG PAIN CLINIC C-ARM 1-60  MIN NO REPORT Fluoro was used, but no Radiologist interpretation will be provided.  Please refer to NOTES tab for provider progress note. Note: Reviewed        Physical Exam  Vitals: BP (!) 159/84 (Patient Position: Sitting, Cuff Size: Normal)   Pulse 65   Temp 98.1 F (36.7 C) (Temporal)   Resp 16   Ht 5' 3 (1.6 m)   Wt 164 lb (74.4 kg)   SpO2 100%   BMI 29.05 kg/m  BMI: Estimated body mass index is 29.05 kg/m as calculated from the following:   Height as of this encounter: 5' 3 (1.6 m).   Weight as of this encounter: 164 lb (74.4 kg). Ideal: Ideal body weight: 52.4 kg (115 lb 8.3 oz) Adjusted ideal body weight: 61.2 kg (134 lb 14.6 oz) General appearance:  Well nourished, well developed, and well hydrated. In no apparent acute distress Mental status: Alert, oriented x 3 (person, place, & time)       Respiratory: No evidence of acute respiratory distress Eyes: PERLA   Assessment   Diagnosis Status  1. Chronic low back pain (Bilateral) (R>L) w/o sciatica   2. Lumbar facet joint pain   3. Low back pain of over 3 months duration   4. Lumbosacral facet syndrome (Bilateral) (R>L)   5. Postop check   6. Chronic knee pain after total replacement of knee joint (Left)    Controlled Controlled Controlled   Updated Problems: No problems updated.  Plan of Care  Problem-specific:  Assessment and Plan    Chronic low back pain after bilateral lumbar facet radiofrequency ablation and lumbar spondylosis   Chronic low back pain continues after bilateral lumbar facet radiofrequency ablation. The right side shows moderate improvement, while the left side initially had 100% relief with local anesthetic, now maintaining a 35% improvement. Pain worsens with flexion, indicating a muscular component, and is centered in the lower back with occasional upper back involvement. Current pain management includes hydrocodone  and a heating pad. Improvement is expected after the six-week healing  period post-ablation. She should use an abdominal binder during work to support the spine during flexion. Information on a TENS unit was provided for home use to manage muscle-related pain. Pain prevention techniques for workdays were advised to avoid post-work pain. The potential use of NSAIDs for swelling was discussed, with caution regarding gastrointestinal side effects.  Chronic left knee pain after total knee replacement   Chronic left knee pain persists following a total knee replacement in 2022. Pain is localized to the lateral aspect of the knee, with occasional medial involvement, and worsens when lying on the left side and with certain movements. There may be contributions from genicular nerves or referred pain from the lumbar spine. Previous x-rays showed a bone spur, suggesting a multifactorial pain origin. A diagnostic genicular nerve block is scheduled to assess pain origin from the knee joint. The potential for referred pain from the lumbar spine was discussed, with further evaluation if necessary.       [name] has a current medication list which includes the following long-term medication(s): aciphex , fluticasone , losartan , and losartan .  Pharmacotherapy (Medications Ordered): No orders of the defined types were placed in this encounter.  Orders:  Orders Placed This Encounter  Procedures   GENICULAR NERVE BLOCK    Indication(s):  Sub-acute knee pain    Standing Status:   Future    Expiration Date:   11/30/2024    Scheduling Instructions:     Side: Left-sided     Sedation: With Sedation.     Timeframe: As soon as schedule allows    Where will this procedure be performed?:   [facility]   Nursing Instructions:    Please complete this patient's postprocedure evaluation.    Scheduling Instructions:     Please complete this patient's postprocedure evaluation.     Interventional Therapies  Risk Factors  Considerations:  MO (BMI>30)  OSA  SOB   Cerebrovascular malformation  GERD  HTN     Planned  Pending:   Diagnostic left genicular NB #1    Under consideration:   Diagnostic left genicular NB #1  Therapeutic/palliative bilateral L-FCT RFA #2  Therapeutic left CESI #2    Completed:   Therapeutic left lumbar facet RFA x1 (07/15/2024) (100/100/35/75)   Therapeutic right  lumbar facet RFA x1 (06/17/2024) (100/100/100/100)  Therapeutic/palliative bilateral lumbar facet MBB x5 (04/13/2024) (100/100/100 x1 week/0)  Diagnostic/therapeutic left IA glenohumeral + AC joint inj. + subacromial bursa inj. x1 (01/09/2023) (100/100/70/70 x 4 months) Therapeutic left cervical ESI x1 (11/26/2022) (75/75/50/50) (Neck: 100  (L) Delgado: 50)  Therapeutic/palliative bilateral lumbar facet MBB x4 (06/19/2023) (100/100/100/100 x 6 months)  Therapeutic right L4-5 LESI x1 (07/27/2020)  Referral to bariatric surgery and medical weight management entered on 07/18/2020.    Completed by other providers:   EEG x2 by Dr. Jannett Fairly (07/03/14, 07/29/14) - WNL  Diagnostic left occipital nerve block x1 (08/10/2014) by Dr. Jannett Fairly [facility] neurology)  Therapeutic right rotator cuff repair x1 (06/14/2015) by HILARIO WENDI Glenn MD [facility]oped[facility])  Therapeutic left IA Synvisc knee injection x2 (10/02/2020) by Morene Redell Sharps, PA [facility] orthopedics and PMR) Therapeutic left total knee replacement x1 (03/06/2021) by Dr. Ozell Flake [facility] orthopedics)   Therapeutic  Palliative (PRN) options:   Palliative bilateral lumbar facet MBB #4  Therapeutic right L4-5 LESI #2      Return for (ECT):(L) Geniculer NB #1.    Recent Visits Date Type Provider Dept  07/15/24 Procedure visit Tanya Glisson, MD [facility]  06/17/24 Procedure visit Tanya Glisson, MD [facility]  Showing recent visits within past 90 days and meeting all other requirements Today's Visits Date Type Provider Dept  08/30/24 Office Visit Tanya Glisson, MD [facility]  Showing today's visits and meeting all other requirements Future Appointments Date Type Provider Dept  09/07/24 Appointment Tanya Glisson, MD [facility]  Showing future appointments within next 90 days and meeting all other requirements  I discussed the assessment and treatment plan with the patient. The patient was provided an opportunity to ask questions and all were answered. The patient agreed with the plan and demonstrated an understanding of the instructions.  Patient advised to call back or seek an in-person evaluation if the symptoms or condition worsens.  Duration of encounter: 38 minutes.  Total time on encounter, as per AMA guidelines included both the face-to-face and non-face-to-face time personally spent by the physician and/or other qualified health care professional(s) on the day of the encounter (includes time in activities that require the physician or other qualified health care professional and does not include time in activities normally performed by clinical staff). Physician's time may include the following activities when performed: Preparing to see the patient (e.g., pre-charting review of records, searching for previously ordered imaging, lab work, and nerve conduction tests) Review of prior analgesic pharmacotherapies. Reviewing PMP Interpreting ordered tests (e.g., lab work, imaging, nerve conduction tests) Performing post-procedure evaluations, including interpretation of diagnostic procedures Obtaining and/or reviewing separately obtained history Performing a medically appropriate examination and/or evaluation Counseling and educating the patient/family/caregiver Ordering medications, tests, or procedures Referring and communicating with other health care professionals (when not separately reported) Documenting clinical information in the electronic or other health record Independently interpreting results (not separately reported)  and communicating results to the patient/ family/caregiver Care coordination (not separately reported)  Note by: Glisson DELENA Tanya, MD (TTS and AI technology used. I apologize for any typographical errors that were not detected and corrected.) Date: 08/30/2024; Time: 12:54 PM

## 2024-08-31 NOTE — Telephone Encounter (Signed)
 open in error

## 2024-09-07 ENCOUNTER — Ambulatory Visit
Admission: RE | Admit: 2024-09-07 | Discharge: 2024-09-07 | Disposition: A | Discharge status: Home / Self Care | Source: Ambulatory Visit | Attending: Pain Medicine | Admitting: Pain Medicine

## 2024-09-07 ENCOUNTER — Ambulatory Visit (HOSPITAL_BASED_OUTPATIENT_CLINIC_OR_DEPARTMENT_OTHER): Admitting: Pain Medicine

## 2024-09-07 VITALS — BP 154/79 | HR 77 | Temp 97.4°F | Resp 18 | Ht 63.0 in | Wt 163.0 lb

## 2024-09-07 DIAGNOSIS — M25562 Pain in left knee: Secondary | ICD-10-CM

## 2024-09-07 DIAGNOSIS — M1712 Unilateral primary osteoarthritis, left knee: Secondary | ICD-10-CM | POA: Diagnosis present

## 2024-09-07 DIAGNOSIS — G8929 Other chronic pain: Secondary | ICD-10-CM | POA: Diagnosis present

## 2024-09-07 DIAGNOSIS — Z96652 Presence of left artificial knee joint: Secondary | ICD-10-CM

## 2024-09-07 MED ORDER — FENTANYL CITRATE (PF) 100 MCG/2ML IJ SOLN
25.0000 ug | INTRAMUSCULAR | Status: DC | PRN
  Administered 2024-09-07: 50 ug via INTRAVENOUS

## 2024-09-07 MED ORDER — LIDOCAINE HCL 2 % IJ SOLN
INTRAMUSCULAR | Status: AC
  Filled 2024-09-07: qty 20

## 2024-09-07 MED ORDER — ROPIVACAINE HCL 2 MG/ML IJ SOLN
INTRAMUSCULAR | Status: AC
  Filled 2024-09-07: qty 20

## 2024-09-07 MED ORDER — MIDAZOLAM HCL 5 MG/5ML IJ SOLN
0.5000 mg | Freq: Once | INTRAMUSCULAR | Status: AC
  Administered 2024-09-07: 2 mg via INTRAVENOUS

## 2024-09-07 MED ORDER — MIDAZOLAM HCL 5 MG/5ML IJ SOLN
INTRAMUSCULAR | Status: AC
  Filled 2024-09-07: qty 5

## 2024-09-07 MED ORDER — FENTANYL CITRATE (PF) 100 MCG/2ML IJ SOLN
INTRAMUSCULAR | Status: AC
  Filled 2024-09-07: qty 2

## 2024-09-07 MED ORDER — LIDOCAINE HCL 2 % IJ SOLN
20.0000 mL | Freq: Once | INTRAMUSCULAR | Status: AC
  Administered 2024-09-07: 400 mg

## 2024-09-07 MED ORDER — ROPIVACAINE HCL 2 MG/ML IJ SOLN
9.0000 mL | Freq: Once | INTRAMUSCULAR | Status: AC
  Administered 2024-09-07: 20 mL

## 2024-09-07 MED ORDER — PENTAFLUOROPROP-TETRAFLUOROETH EX AERO
INHALATION_SPRAY | Freq: Once | CUTANEOUS | Status: AC
  Administered 2024-09-07: 30 via TOPICAL

## 2024-09-07 MED ORDER — METHYLPREDNISOLONE ACETATE 40 MG/ML IJ SUSP
INTRAMUSCULAR | Status: AC
  Filled 2024-09-07: qty 1

## 2024-09-07 MED ORDER — METHYLPREDNISOLONE ACETATE 40 MG/ML IJ SUSP
40.0000 mg | Freq: Once | INTRAMUSCULAR | Status: AC
  Administered 2024-09-07: 40 mg

## 2024-09-07 NOTE — Progress Notes (Signed)
 PROVIDER NOTE: Interpretation of information contained herein should be left to medically-trained personnel. Specific patient instructions are provided elsewhere under Patient Instructions section of medical record. This document was created in part using STT-dictation technology, any transcriptional errors that may result from this process are unintentional.  Patient: Jacqueline Delgado Type: Established DOB: 1950-12-18 MRN: [id number] PCP: Dineen Rollene MATSU, FNP  Service: Procedure DOS: 09/07/2024 Setting: Ambulatory Location: Ambulatory outpatient facility Delivery: Face-to-face Provider: Eric DELENA Como, MD Specialty: Interventional Pain Management Specialty designation: 09 Location: Outpatient facility Ref. Prov.: Dineen Rollene MATSU, FNP       Interventional Therapy   Procedure:          Type: Genicular Nerves Block (Superolateral, Superomedial, and Inferomedial Genicular Nerves)  #1  Laterality: Left (-LT)  Level: Superior and inferior to the knee joint.  Imaging: Fluoroscopic guidance Anesthesia: Local anesthesia (1-2% Lidocaine ) Anxiolysis: IV Versed   2.0 mg           Sedation: Minimal Sedation Fentanyl  1 mL (50 mcg) DOS: 09/07/2024  Performed by: Eric DELENA Como, MD  Purpose: Diagnostic/Therapeutic Indications: Chronic knee pain severe enough to impact quality of life or function. Rationale (medical necessity): procedure needed and proper for the diagnosis and/or treatment of [name]'s medical symptoms and needs. 1. Chronic knee pain (3ry area of Pain) (Left)   2. Chronic knee pain after total replacement of knee joint (Left)   3. S/P TKR (total knee replacement) using cement, left   4. Osteoarthritis of knee (Left)    NAS-11 Pain score:   Pre-procedure: 4 /10   Post-procedure: 0-No pain/10     Target: For Genicular Nerve block(s), the targets are: the superolateral genicular nerve, located in the lateral distal portion of the femoral shaft as it curves to  form the lateral epicondyle, in the region of the distal femoral metaphysis; the superomedial genicular nerve, located in the medial distal portion of the femoral shaft as it curves to form the medial epicondyle; and the inferomedial genicular nerve, located in the medial, proximal portion of the tibial shaft, as it curves to form the medial epicondyle, in the region of the proximal tibial metaphysis.  Location: Superolateral, Superomedial, and Inferomedial aspects of knee joint.  Region: Lateral, Anterior, and Medial aspects of the knee joint, above and below the knee joint proper. Approach: Percutaneous  Type of procedure: Percutaneous perineural nerve block. The genicular nerve block is a motor-sparing technique that anesthetizes the sensory terminal branches innervating the knee joint, resulting in anesthesia of the anterior compartment of the knee. The distribution of anesthesia of each nerve is mostly in the corresponding quadrant.  Neuroanatomy: The superolateral genicular nerve (SLGN) courses around the femur shaft to pass between the vastus lateralis and the lateral epicondyle. It accompanies the superior lateral genicular artery. The superomedial genicular nerve (SMGN) courses around the femur shaft, following the superior medial genicular artery, to pass between the adductor magnus tendon and the medial epicondyle below the vastus medialis. The inferolateral genicular nerve (ILGN) courses around the tibial lateral epicondyle deep to the lateral collateral ligament, following the inferior lateral genicular artery, superior of the fibula head. The inferomedial genicular nerve (IMGN) courses horizontally below the medial collateral ligament between the tibial medial epicondyle and the insertion of the collateral ligament. It accompanies the inferior medial genicular artery. The recurrent peroneal nerve originates in the inferior popliteal region from the common peroneal nerve and courses horizontally  around the fibula to pass just inferior of the fibula head and travel  superior to the anterolateral tibial epicondyle. It accompanies the recurrent tibial artery.  Position / Prep / Materials:  Position: Supine, Modified Fowler's position with pillows under the targeted knee(s). The patient is placed in a supine position with the knee slightly flexed by placing a pillow in the popliteal fossa. Prep solution: ChloraPrep (2% chlorhexidine  gluconate and 70% isopropyl alcohol) Prep Area: Entire knee area, from mid-thigh to mid-shin, lateral, anterior, and medial aspects. Materials:  Tray: Block Needle(s):  Type: Spinal  Gauge (G): 22  Length: 3.5-in  Qty: 3  H&P (Pre-op Assessment):  [name] is a 73 y.o. (year old), female patient, seen today for interventional treatment. She  has a past surgical history that includes Joint replacement (Right); Joint replacement; Cholecystectomy; Abdominal hysterectomy; Shoulder arthroscopy with rotator cuff repair and subacromial decompression (Right, 06/14/2015); Esophagogastroduodenoscopy (egd) with propofol  (N/A, 10/11/2015); Savory dilation (N/A, 10/11/2015); Colonoscopy w/ polypectomy; Esophagogastroduodenoscopy (egd) with propofol  (N/A, 12/10/2017); Colonoscopy with propofol  (N/A, 12/10/2017); Total knee arthroplasty (Left, 03/06/2021); Joint replacement (Left); Esophagogastroduodenoscopy (egd) with propofol  (N/A, 05/03/2022); Breast surgery; Carpal tunnel release; Trigger finger release; Colonoscopy with propofol  (N/A, 04/25/2023); and polypectomy (04/25/2023). [name] has a current medication list which includes the following prescription(s): aciphex , famotidine , fluticasone , losartan , losartan , ondansetron , mounjaro , and trospium , and the following Facility-Administered Medications: fentanyl . Her primarily concern today is the Knee Pain (left)  Initial Vital Signs:  Pulse/HCG Rate: 77ECG Heart Rate: 72 Temp: (!) 97.5 F (36.4 C) Resp: 16 BP: (!)  148/96 SpO2: 100 %  BMI: Estimated body mass index is 28.87 kg/m as calculated from the following:   Height as of this encounter: 5' 3 (1.6 m).   Weight as of this encounter: 163 lb (73.9 kg).  Risk Assessment: Allergies: Reviewed. She has no known allergies.  Allergy Precautions: None required Coagulopathies: Reviewed. None identified.  Blood-thinner therapy: None at this time Active Infection(s): Reviewed. None identified. Ms. Gordy is afebrile  Site Confirmation: Ms. Sporrer was asked to confirm the procedure and laterality before marking the site Procedure checklist: Completed Consent: Before the procedure and under the influence of no sedative(s), amnesic(s), or anxiolytics, the patient was informed of the treatment options, risks and possible complications. To fulfill our ethical and legal obligations, as recommended by the American Medical Association's Code of Ethics, I have informed the patient of my clinical impression; the nature and purpose of the treatment or procedure; the risks, benefits, and possible complications of the intervention; the alternatives, including doing nothing; the risk(s) and benefit(s) of the alternative treatment(s) or procedure(s); and the risk(s) and benefit(s) of doing nothing. The patient was provided information about the general risks and possible complications associated with the procedure. These may include, but are not limited to: failure to achieve desired goals, infection, bleeding, organ or nerve damage, allergic reactions, paralysis, and death. In addition, the patient was informed of those risks and complications associated to the procedure, such as failure to decrease pain; infection; bleeding; organ or nerve damage with subsequent damage to sensory, motor, and/or autonomic systems, resulting in permanent pain, numbness, and/or weakness of one or several areas of the body; allergic reactions; (i.e.: anaphylactic reaction); and/or  death. Furthermore, the patient was informed of those risks and complications associated with the medications. These include, but are not limited to: allergic reactions (i.e.: anaphylactic or anaphylactoid reaction(s)); adrenal axis suppression; blood sugar elevation that in diabetics may result in ketoacidosis or comma; water retention that in patients with history of congestive heart failure may result in shortness of breath,  pulmonary edema, and decompensation with resultant heart failure; weight gain; swelling or edema; medication-induced neural toxicity; particulate matter embolism and blood vessel occlusion with resultant organ, and/or nervous system infarction; and/or aseptic necrosis of one or more joints. Finally, the patient was informed that Medicine is not an exact science; therefore, there is also the possibility of unforeseen or unpredictable risks and/or possible complications that may result in a catastrophic outcome. The patient indicated having understood very clearly. We have given the patient no guarantees and we have made no promises. Enough time was given to the patient to ask questions, all of which were answered to the patient's satisfaction. Ms. Munnerlyn has indicated that she wanted to continue with the procedure. Attestation: I, the ordering provider, attest that I have discussed with the patient the benefits, risks, side-effects, alternatives, likelihood of achieving goals, and potential problems during recovery for the procedure that I have provided informed consent. Date  Time: 09/07/2024  9:18 AM  Pre-Procedure Preparation:  Monitoring: As per clinic protocol. Respiration, ETCO2, SpO2, BP, heart rate and rhythm monitor placed and checked for adequate function Safety Precautions: Patient was assessed for positional comfort and pressure points before starting the procedure. Time-out: I initiated and conducted the Time-out before starting the procedure, as per protocol. The  patient was asked to participate by confirming the accuracy of the Time Out information. Verification of the correct person, site, and procedure were performed and confirmed by me, the nursing staff, and the patient. Time-out conducted as per Joint Commission's Universal Protocol (UP.01.01.01). Time: 1021 Start Time: 1021 hrs.  Description/Narrative of Procedure:          Rationale (medical necessity): procedure needed and proper for the diagnosis and/or treatment of the patient's medical symptoms and needs. Procedural Technique Safety Precautions: Aspiration looking for blood return was conducted prior to all injections. At no point did we inject any substances, as a needle was being advanced. No attempts were made at seeking any paresthesias. Safe injection practices and needle disposal techniques used. Medications properly checked for expiration dates. SDV (single dose vial) medications used. Description of the Procedure: Protocol guidelines were followed. The patient was assisted into a comfortable position. The target area was identified and the area prepped in the usual manner. Skin & deeper tissues infiltrated with local anesthetic. Appropriate amount of time allowed to pass for local anesthetics to take effect. The procedure needles were then advanced to the target area. Proper needle placement secured. Negative aspiration confirmed. Solution injected in intermittent fashion, asking for systemic symptoms every 0.5cc of injectate. The needles were then removed and the area cleansed, making sure to leave some of the prepping solution back to take advantage of its long term bactericidal properties.             Vitals:   09/07/24 1027 09/07/24 1036 09/07/24 1045 09/07/24 1053  BP:  (!) 155/77 (!) 148/76 (!) 154/79  Pulse:      Resp: 15 14 12 18   Temp:  (!) 97.5 F (36.4 C)  (!) 97.4 F (36.3 C)  TempSrc:  Temporal  Temporal  SpO2: 100% 96% 96% 97%  Weight:      Height:          Start Time: 1021 hrs. End Time: 1026 hrs.  Imaging Guidance (Non-Spinal):          Type of Imaging Technique: Fluoroscopy Guidance (Non-Spinal) Indication(s): Fluoroscopy guidance for needle placement to enhance accuracy in procedures requiring precise needle localization for targeted delivery of  medication in or near specific anatomical locations not easily accessible without such real-time imaging assistance. Exposure Time: Please see nurses notes. Contrast: None used. Fluoroscopic Guidance: I was personally present during the use of fluoroscopy. Tunnel Vision Technique used to obtain the best possible view of the target area. Parallax error corrected before commencing the procedure. Direction-depth-direction technique used to introduce the needle under continuous pulsed fluoroscopy. Once target was reached, antero-posterior, oblique, and lateral fluoroscopic projection used confirm needle placement in all planes. Images permanently stored in EMR. Interpretation: No contrast injected. I personally interpreted the imaging intraoperatively. Adequate needle placement confirmed in multiple planes. Permanent images saved into the patient's record.  Post-operative Assessment:  Post-procedure Vital Signs:  Pulse/HCG Rate: 7764 Temp: (!) 97.4 F (36.3 C) Resp: 18 BP: (!) 154/79 SpO2: 97 %  EBL: None  Complications: No immediate post-treatment complications observed by team, or reported by patient.  Note: The patient tolerated the entire procedure well. A repeat set of vitals were taken after the procedure and the patient was kept under observation following institutional policy, for this type of procedure. Post-procedural neurological assessment was performed, showing return to baseline, prior to discharge. The patient was provided with post-procedure discharge instructions, including a section on how to identify potential problems. Should any problems arise concerning this procedure, the  patient was given instructions to immediately contact us , at any time, without hesitation. In any case, we plan to contact the patient by telephone for a follow-up status report regarding this interventional procedure.  Comments:  No additional relevant information.  Plan of Care (POC)  Orders:  Orders Placed This Encounter  Procedures   GENICULAR NERVE BLOCK    Scheduling Instructions:     Side(s): Left Knee     Level(s): Superior-Lateral, Superior-Medial, and Inferior-Medial Genicular Nerves     Sedation: With Sedation.     Date: 09/07/2024    Where will this procedure be performed?:   [facility]   [facility] C-ARM 1-60 MIN NO REPORT    Intraoperative interpretation by procedural physician at [facility].    Standing Status:   Standing    Number of Occurrences:   1    Reason for exam::   Assistance in needle guidance and placement for procedures requiring needle placement in or near specific anatomical locations not easily accessible without such assistance.   Informed Consent Details: Physician/Practitioner Attestation; Transcribe to consent form and obtain patient signature    Note: Always confirm laterality of pain with Ms. Genao, before procedure. Transcribe to consent form and obtain patient signature.    Physician/Practitioner attestation of informed consent for procedure/surgical case:   I, the physician/practitioner, attest that I have discussed with the patient the benefits, risks, side effects, alternatives, likelihood of achieving goals and potential problems during recovery for the procedure that I have provided informed consent.    Procedure:   Block of the genicular nerves of the knee (Genicular Nerves Block)    Physician/Practitioner performing the procedure:   Tansy Lorek A. Janoah Menna, MD    Indication/Reason:   Chronic knee pain   Provide equipment / supplies at bedside    Procedure tray: Block Tray (Disposable  single use) Skin infiltration  needle: Regular 1.5-in, 25-G, (x1) Block Needle type: Spinal Amount/quantity: 3 Size: Medium (5-inch) Gauge: 22G    Standing Status:   Standing    Number of Occurrences:   1    Specify:   Block Tray   Saline lock IV  Have LR [phone] mL available and administer at 125 mL/hr if patient becomes hypotensive.    Standing Status:   Standing    Number of Occurrences:   1     Opioid Analgesic:  No chronic opioid analgesics therapy prescribed by our practice. Tramadol  50 mg tablet, 1 tab p.o. twice daily (PRN) MME/day: 0-10 mg/day    Medications ordered for procedure: Meds ordered this encounter  Medications   lidocaine  (XYLOCAINE ) 2 % (with pres) injection 400 mg   pentafluoroprop-tetrafluoroeth (GEBAUERS) aerosol   midazolam  (VERSED ) 5 MG/5ML injection 0.5-2 mg    Make sure Flumazenil is available in the pyxis when using this medication. If oversedation occurs, administer 0.2 mg IV over 15 sec. If after 45 sec no response, administer 0.2 mg again over 1 min; may repeat at 1 min intervals; not to exceed 4 doses (1 mg)   fentaNYL  (SUBLIMAZE ) injection 25-50 mcg    Make sure Narcan is available in the pyxis when using this medication. In the event of respiratory depression (RR< 8/min): Titrate NARCAN (naloxone) in increments of 0.1 to 0.2 mg IV at 2-3 minute intervals, until desired degree of reversal.   ropivacaine  (PF) 2 mg/mL (0.2%) (NAROPIN ) injection 9 mL   methylPREDNISolone  acetate (DEPO-MEDROL ) injection 40 mg   Medications administered: We administered lidocaine , pentafluoroprop-tetrafluoroeth, midazolam , fentaNYL , ropivacaine  (PF) 2 mg/mL (0.2%), and methylPREDNISolone  acetate.  See the medical record for exact dosing, route, and time of administration.    Interventional Therapies  Risk Factors  Considerations:  MO (BMI>30)  OSA  SOB  Cerebrovascular malformation  GERD  HTN     Planned  Pending:   Diagnostic left genicular NB #1    Under consideration:    Diagnostic left genicular NB #1  Therapeutic/palliative bilateral L-FCT RFA #2  Therapeutic left CESI #2    Completed:   Therapeutic left lumbar facet RFA x1 (07/15/2024) (100/100/35/75)   Therapeutic right lumbar facet RFA x1 (06/17/2024) (100/100/100/100)  Therapeutic/palliative bilateral lumbar facet MBB x5 (04/13/2024) (100/100/100 x1 week/0)  Diagnostic/therapeutic left IA glenohumeral + AC joint inj. + subacromial bursa inj. x1 (01/09/2023) (100/100/70/70 x 4 months) Therapeutic left cervical ESI x1 (11/26/2022) (75/75/50/50) (Neck: 100  (L) shoulder: 50)  Therapeutic/palliative bilateral lumbar facet MBB x4 (06/19/2023) (100/100/100/100 x 6 months)  Therapeutic right L4-5 LESI x1 (07/27/2020)  Referral to bariatric surgery and medical weight management entered on 07/18/2020.    Completed by other providers:   EEG x2 by Dr. Jannett Fairly (07/03/14, 07/29/14) - WNL  Diagnostic left occipital nerve block x1 (08/10/2014) by Dr. Jannett Fairly [facility] neurology)  Therapeutic right rotator cuff repair x1 (06/14/2015) by HILARIO WENDI Glenn MD [facility] orthopedics)  Therapeutic left IA Synvisc knee injection x2 (10/02/2020) by Morene Redell Sharps, PA [facility] orthopedics and PMR) Therapeutic left total knee replacement x1 (03/06/2021) by Dr. Ozell Flake [facility] orthopedics)   Therapeutic  Palliative (PRN) options:   Palliative bilateral lumbar facet MBB #4  Therapeutic right L4-5 LESI #2       Follow-up plan:   Return in about 2 weeks (around 09/21/2024) for (Face2F), (PPE).     Recent Visits Date Type Provider Dept  08/30/24 Office Visit Tanya Glisson, MD [facility]  07/15/24 Procedure visit Tanya Glisson, MD [facility]  06/17/24 Procedure visit Tanya Glisson, MD [facility]  Showing recent visits within past 90 days and meeting all other requirements Today's Visits Date Type Provider Dept  09/07/24 Procedure visit Tanya Glisson, MD [facility]  Showing today's visits and meeting all other requirements Future Appointments Date Type Provider Dept  10/04/24 Appointment Tanya Glisson, MD [facility]  Showing future appointments within next 90 days and meeting all other requirements   Disposition: Discharge home  Discharge (Date  Time): 09/07/2024; 1102 hrs.   Primary Care Physician: Dineen Rollene MATSU, FNP Location: [facility] Note by: Glisson DELENA Tanya, MD (TTS technology used. I apologize for any typographical errors that were not detected and corrected.) Date: 09/07/2024; Time: 11:16 AM  Disclaimer:  Medicine is not an visual merchandiser. The only guarantee in medicine is that nothing is guaranteed. It is important to note that the decision to proceed with this intervention was based on the information collected from the patient. The Data and conclusions were drawn from the patient's questionnaire, the interview, and the physical examination. Because the information was provided in large part by the patient, it cannot be guaranteed that it has not been purposely or unconsciously manipulated. Every effort has been made to obtain as much relevant data as possible for this evaluation. It is important to note that the conclusions that lead to this procedure are derived in large part from the available data. Always take into account that the treatment will also be dependent on availability of resources and existing treatment guidelines, considered by other Pain Management Practitioners as being common knowledge and practice, at the time of the intervention. For Medico-Legal purposes, it is also important to point out that variation in procedural techniques and pharmacological choices are the acceptable norm. The indications, contraindications, technique, and results of the above procedure should only be interpreted and judged by a Board-Certified Interventional Pain Specialist with extensive  familiarity and expertise in the same exact procedure and technique.

## 2024-09-07 NOTE — Patient Instructions (Signed)
 ______________________________________________________________________    Post-Procedure Discharge Instructions  INSTRUCTIONS Apply ice:  Purpose: This will minimize any swelling and discomfort after procedure.  When: Day of procedure, as soon as you get home. How: Fill a plastic sandwich bag with crushed ice. Cover it with a small towel and apply to injection site. How long: (15 min on, 15 min off) Apply for 15 minutes then remove x 15 minutes.  Repeat sequence on day of procedure, until you go to bed. Apply heat:  Purpose: To treat any soreness and discomfort from the procedure. When: Starting the next day after the procedure. How: Apply heat to procedure site starting the day following the procedure. How long: May continue to repeat daily, until discomfort goes away. Food intake: Start with clear liquids (like water) and advance to regular food, as tolerated.  Physical activities: Keep activities to a minimum for the first 8 hours after the procedure. After that, then as tolerated. Driving: If you have received any sedation, be responsible and do not drive. You are not allowed to drive for 24 hours after having sedation. Blood thinner: (Applies only to those taking blood thinners) You may restart your blood thinner 6 hours after your procedure. Insulin: (Applies only to Diabetic patients taking insulin) As soon as you can eat, you may resume your normal dosing schedule. Infection prevention: Keep procedure site clean and dry. Shower daily and clean area with soap and water.  PAIN DIARY Post-procedure Pain Diary: Extremely important that this be done correctly and accurately. Recorded information will be used to determine the next step in treatment. For the purpose of accuracy, follow these rules: Evaluate only the area treated. Do not report or include pain from an untreated area. For the purpose of this evaluation, ignore all other areas of pain, except for the treated area. After your  procedure, avoid taking a long nap and attempting to complete the pain diary after you wake up. Instead, set your alarm clock to go off every hour, on the hour, for the initial 8 hours after the procedure. Document the duration of the numbing medicine, and the relief you are getting from it. Do not go to sleep and attempt to complete it later. It will not be accurate. If you received sedation, it is likely that you were given a medication that may cause amnesia. Because of this, completing the diary at a later time may cause the information to be inaccurate. This information is needed to plan your care. Follow-up appointment: Keep your post-procedure follow-up evaluation appointment after the procedure (usually 2 weeks for most procedures, 6 weeks for radiofrequencies). DO NOT FORGET to bring you pain diary with you.   EXPECT... (What should I expect to see with my procedure?) From numbing medicine (AKA: Local Anesthetics): Numbness or decrease in pain. You may also experience some weakness, which if present, could last for the duration of the local anesthetic. Onset: Full effect within 15 minutes of injected. Duration: It will depend on the type of local anesthetic used. On the average, 1 to 8 hours.  From steroids (Applies only if steroids were used): Decrease in swelling or inflammation. Once inflammation is improved, relief of the pain will follow. Onset of benefits: Depends on the amount of swelling present. The more swelling, the longer it will take for the benefits to be seen. In some cases, up to 10 days. Duration: Steroids will stay in the system x 2 weeks. Duration of benefits will depend on multiple posibilities including persistent irritating  factors. Side-effects: If present, they may typically last 2 weeks (the duration of the steroids). Frequent: Cramps (if they occur, drink Gatorade and take over-the-counter Magnesium 450-500 mg once to twice a day); water retention with temporary weight  gain; increases in blood sugar; decreased immune system response; increased appetite. Occasional: Facial flushing (red, warm cheeks); mood swings; menstrual changes. Uncommon: Long-term decrease or suppression of natural hormones; bone thinning. (These are more common with higher doses or more frequent use. This is why we prefer that our patients avoid having any injection therapies in other practices.)  Very Rare: Severe mood changes; psychosis; aseptic necrosis. From procedure: Some discomfort is to be expected once the numbing medicine wears off. This should be minimal if ice and heat are applied as instructed.  CALL IF... (When should I call?) You experience numbness and weakness that gets worse with time, as opposed to wearing off. New onset bowel or bladder incontinence. (Applies only to procedures done in the spine)  Emergency Numbers: Durning business hours (Monday - Thursday, 8:00 AM - 4:00 PM) (Friday, 9:00 AM - 12:00 Noon): [phone] After hours: [phone] NOTE: If you are having a problem and are unable connect with, or to talk to a provider, then go to your nearest urgent care or emergency department. If the problem is serious and urgent, please call 911. ______________________________________________________________________     ______________________________________________________________________    Steroid injections  Common steroids for injections Triamcinolone : Used by many sports medicine physicians for large joint and bursal injections, often combined with a local anesthetic like lidocaine . A study focusing on coccydynia (tailbone pain) found triamcinolone  was more effective than betamethasone, suggesting it may also be preferable for other localized inflammation conditions. Methylprednisolone: A common alternative to triamcinolone  that is also a strong anti-inflammatory. It is available in different formulations, with the acetate suspension being the long-acting  option for intra-articular injections. Dexamethasone : This is a non-particulate steroid, meaning it has a lower risk of tissue damage compared to particulate steroids like triamcinolone  and methylprednisolone. While less common for this specific use, it is an option for targeted injections.   Considerations for physicians Particulate vs. non-particulate steroids: Triamcinolone  and methylprednisolone are particulate, meaning they can clump together. Dexamethasone  is non-particulate. Particulate steroids are often preferred for their longer-lasting effects but carry a theoretical higher risk for certain injections (though this is less of a concern in the costochondral joints). Combined injectate: Corticosteroids are typically mixed with a local anesthetic like lidocaine  to provide both immediate pain relief (from the anesthetic) and longer-term inflammation reduction (from the steroid). Imaging guidance: To ensure accurate placement of the needle and medication, physicians may use ultrasound or fluoroscopic guidance for the injection, especially in complex or refractory cases.   Patient guidance Before undergoing a steroid injection, discuss the options with your physician. They will determine the best steroid, dosage, and procedure for your specific case based on factors like: Severity of your condition History of response to other treatments Your overall health status Experience and preference of the physician  Last  Updated: 06/08/2024 ______________________________________________________________________

## 2024-09-07 NOTE — Progress Notes (Signed)
 Safety precautions to be maintained throughout the outpatient stay will include: orient to surroundings, keep bed in low position, maintain call bell within reach at all times, provide assistance with transfer out of bed and ambulation.

## 2024-09-08 ENCOUNTER — Telehealth: Payer: Self-pay | Admitting: *Deleted

## 2024-09-08 NOTE — Telephone Encounter (Signed)
 Post procedure call; voicemail left

## 2024-09-15 ENCOUNTER — Encounter: Admitting: Family

## 2024-09-23 ENCOUNTER — Encounter: Admitting: Family

## 2024-09-27 ENCOUNTER — Encounter: Payer: Self-pay | Admitting: Family

## 2024-09-27 ENCOUNTER — Ambulatory Visit: Admitting: Family

## 2024-09-27 VITALS — BP 148/84 | HR 68 | Temp 97.9°F | Ht 63.0 in | Wt 159.4 lb

## 2024-09-27 DIAGNOSIS — N644 Mastodynia: Secondary | ICD-10-CM | POA: Diagnosis not present

## 2024-09-27 DIAGNOSIS — I10 Essential (primary) hypertension: Secondary | ICD-10-CM

## 2024-09-27 DIAGNOSIS — R899 Unspecified abnormal finding in specimens from other organs, systems and tissues: Secondary | ICD-10-CM | POA: Diagnosis not present

## 2024-09-27 DIAGNOSIS — Z23 Encounter for immunization: Secondary | ICD-10-CM

## 2024-09-27 DIAGNOSIS — Z0000 Encounter for general adult medical examination without abnormal findings: Secondary | ICD-10-CM

## 2024-09-27 LAB — URINALYSIS, ROUTINE W REFLEX MICROSCOPIC
Bilirubin Urine: NEGATIVE
Hgb urine dipstick: NEGATIVE
Ketones, ur: NEGATIVE
Leukocytes,Ua: NEGATIVE
Nitrite: NEGATIVE
Specific Gravity, Urine: 1.025 (ref 1.000–1.030)
Total Protein, Urine: NEGATIVE
Urine Glucose: NEGATIVE
Urobilinogen, UA: 1 (ref 0.0–1.0)
pH: 6 (ref 5.0–8.0)

## 2024-09-27 LAB — BASIC METABOLIC PANEL WITH GFR
BUN: 16 mg/dL (ref 6–23)
CO2: 30 meq/L (ref 19–32)
Calcium: 9.8 mg/dL (ref 8.4–10.5)
Chloride: 106 meq/L (ref 96–112)
Creatinine, Ser: 0.77 mg/dL (ref 0.40–1.20)
GFR: 76.95 mL/min (ref >60.00)
Glucose, Bld: 69 mg/dL — ABNORMAL LOW (ref 70–99)
Potassium: 3.6 meq/L (ref 3.5–5.1)
Sodium: 141 meq/L (ref 135–145)

## 2024-09-27 MED ORDER — LOSARTAN POTASSIUM 100 MG PO TABS: 100.0000 mg | ORAL_TABLET | Freq: Every day | ORAL | 3 refills | Status: AC

## 2024-09-27 NOTE — Progress Notes (Unsigned)
 Assessment & Plan:  Need for influenza vaccination     Return precautions given.   Risks, benefits, and alternatives of the medications and treatment plan prescribed today were discussed, and patient expressed understanding.   Education regarding symptom management and diagnosis given to patient on AVS either electronically or printed.  No follow-ups on file.  Rollene Northern, FNP  Subjective:    Patient ID: Jacqueline Delgado, female    DOB: [birth year], 73 y.o.   MRN: [id number]  CC: Jacqueline Delgado is a 73 y.o. female who presents today for physical exam.    HPI: HPI Compliant with losartan  75mg .    Colorectal Cancer Screening: UTD , 04/25/2023; repeat in 3 years  Breast Cancer Screening: Mammogram UTD, 04/2024  Cervical Cancer Screening: partial hysterectomy; per patient has left ovary. she states NO CERVIX. No h/o GYN cancer.   Bone Health screening/DEXA for 65+: due  Lung Cancer Screening: Doesn't have 20 year pack year history and age > 62 years yo 31 years  No family history of AAA.        Tetanus - UTD        Pneumococcal - UTD Exercise: No regular exercise.   Alcohol use:  none Smoking/tobacco use: Nonsmoker.    Health Maintenance  Topic Date Due   Zoster (Shingles) Vaccine (1 of 2) Never done   Flu Shot  05/14/2024   Medicare Annual Wellness Visit  03/03/2025   Breast Cancer Screening  04/14/2026   Colon Cancer Screening  04/24/2026   DTaP/Tdap/Td vaccine (2 - Td or Tdap) 11/03/2031   Osteoporosis screening with Bone Density Scan  Completed   Hepatitis C Screening  Completed   Meningitis B Vaccine  Aged Out   Pneumococcal Vaccine for age over 74  Discontinued   COVID-19 Vaccine  Discontinued    ALLERGIES: Patient has no known allergies.  Medications Ordered Prior to Encounter[1]  Review of Systems    Objective:    BP (!) 148/84   Pulse 68   Temp 97.9 F (36.6 C) (Oral)   Ht 5' 3 (1.6 m)   Wt 159 lb 6.4 oz (72.3 kg)   SpO2 99%    BMI 28.24 kg/m   BP Readings from Last 3 Encounters:  09/27/24 (!) 148/84  09/07/24 (!) 154/79  08/30/24 (!) 159/84   Wt Readings from Last 3 Encounters:  09/27/24 159 lb 6.4 oz (72.3 kg)  09/07/24 163 lb (73.9 kg)  08/30/24 164 lb (74.4 kg)    Physical Exam Vitals reviewed.  Constitutional:      Appearance: Normal appearance. She is well-developed.  Eyes:     Conjunctiva/sclera: Conjunctivae normal.  Neck:     Thyroid : No thyroid  mass or thyromegaly.  Cardiovascular:     Rate and Rhythm: Normal rate and regular rhythm.     Pulses: Normal pulses.     Heart sounds: Normal heart sounds.  Pulmonary:     Effort: Pulmonary effort is normal.     Breath sounds: Normal breath sounds. No wheezing, rhonchi or rales.  Chest:  Breasts:    Breasts are symmetrical.     Right: Tenderness present. No inverted nipple, mass, nipple discharge or skin change.     Left: No inverted nipple, mass, nipple discharge, skin change or tenderness.     Comments: right breast tenderness, diffuse. no mass Abdominal:     General: Bowel sounds are normal. There is no distension.     Palpations: Abdomen is soft. Abdomen is not  rigid. There is no fluid wave or mass.     Tenderness: There is no abdominal tenderness. There is no guarding or rebound.  Lymphadenopathy:     Head:     Right side of head: No submental, submandibular, tonsillar, preauricular, posterior auricular or occipital adenopathy.     Left side of head: No submental, submandibular, tonsillar, preauricular, posterior auricular or occipital adenopathy.     Cervical: No cervical adenopathy.     Right cervical: No superficial, deep or posterior cervical adenopathy.    Left cervical: No superficial, deep or posterior cervical adenopathy.  Skin:    General: Skin is warm and dry.  Neurological:     Mental Status: She is alert.  Psychiatric:        Speech: Speech normal.        Behavior: Behavior normal.        Thought Content: Thought  content normal.             [1] Current Outpatient Medications on File Prior to Visit  Medication Sig Dispense Refill   ACIPHEX  20 MG tablet TAKE 1 TABLET (20 MG TOTAL) BY MOUTH IN THE MORNING AND AT BEDTIME.(DAW 1-BRAND NAME) 180 tablet 3   famotidine  (PEPCID ) 40 MG tablet Take 40 mg by mouth at bedtime.     fluticasone  (FLONASE ) 50 MCG/ACT nasal spray PLACE 2 SPRAYS INTO BOTH NOSTRILS DAILY AS NEEDED FOR ALLERGIES OR RHINITIS. 48 mL 1   losartan  (COZAAR ) 100 MG tablet Take 0.5 tablets (50 mg total) by mouth daily. 90 tablet 3   losartan  (COZAAR ) 25 MG tablet Take 0.5 tablets (12.5 mg total) by mouth daily as needed. BP > 140/80 90 tablet 0   ondansetron  (ZOFRAN -ODT) 4 MG disintegrating tablet TAKE 1 TABLET BY MOUTH EVERY 8 HOURS AS NEEDED FOR NAUSEA AND VOMITING 30 tablet 1   tirzepatide  (MOUNJARO ) 12.5 MG/0.5ML Pen Inject 12.5 mg into the skin once a week. 12 mL 0   trospium  (SANCTURA ) 20 MG tablet Take 1 tablet (20 mg total) by mouth at bedtime. 30 tablet 2   No current facility-administered medications on file prior to visit.

## 2024-09-27 NOTE — Patient Instructions (Addendum)
 Increase losartan  from 75mg  to 100mg   It is imperative that you are seen AT least twice per year for labs and monitoring. Monitor blood pressure at home and me 5-6 reading on separate days. Goal is less than 120/80, based on newest guidelines, however we certainly want to be less than 130/80;  if persistently higher, please make sooner follow up appointment so we can recheck you blood pressure and manage/ adjust medications.   Right breast diagnostic images ordered   order please call  and schedule your 3D mammogram and /or bone density scan as we discussed.   [facility]  ( new location in 2023)  [address], [place], [place] [address]  [place], [place]  [phone]   Health Maintenance for Postmenopausal Women Menopause is a normal process in which your ability to get pregnant comes to an end. This process happens slowly over many months or years, usually between the ages of 85 and 51. Menopause is complete when you have missed your menstrual period for 12 months. It is important to talk with your health care provider about some of the most common conditions that affect women after menopause (postmenopausal women). These include heart disease, cancer, and bone loss (osteoporosis). Adopting a healthy lifestyle and getting preventive care can help to promote your health and wellness. The actions you take can also lower your chances of developing some of these common conditions. What are the signs and symptoms of menopause? During menopause, you may have the following symptoms: Hot flashes. These can be moderate or severe. Night sweats. Decrease in sex drive. Mood swings. Headaches. Tiredness (fatigue). Irritability. Memory problems. Problems falling asleep or staying asleep. Talk with your health care provider about treatment options for your symptoms. Do I need hormone replacement therapy? Hormone replacement therapy is effective in treating symptoms that are  caused by menopause, such as hot flashes and night sweats. Hormone replacement carries certain risks, especially as you become older. If you are thinking about using estrogen or estrogen with progestin, discuss the benefits and risks with your health care provider. How can I reduce my risk for heart disease and stroke? The risk of heart disease, heart attack, and stroke increases as you age. One of the causes may be a change in the body's hormones during menopause. This can affect how your body uses dietary fats, triglycerides, and cholesterol. Heart attack and stroke are medical emergencies. There are many things that you can do to help prevent heart disease and stroke. Watch your blood pressure High blood pressure causes heart disease and increases the risk of stroke. This is more likely to develop in people who have high blood pressure readings or are overweight. Have your blood pressure checked: Every 3-5 years if you are 5-68 years of age. Every year if you are 74 years old or older. Eat a healthy diet  Eat a diet that includes plenty of vegetables, fruits, low-fat dairy products, and lean protein. Do not eat a lot of foods that are high in solid fats, added sugars, or sodium. Get regular exercise Get regular exercise. This is one of the most important things you can do for your health. Most adults should: Try to exercise for at least 150 minutes each week. The exercise should increase your heart rate and make you sweat (moderate-intensity exercise). Try to do strengthening exercises at least twice each week. Do these in addition to the moderate-intensity exercise. Spend less time sitting. Even light physical activity can be beneficial.  Other tips Work with your health care provider to achieve or maintain a healthy weight. Do not use any products that contain nicotine or tobacco. These products include cigarettes, chewing tobacco, and vaping devices, such as e-cigarettes. If you need help  quitting, ask your health care provider. Know your numbers. Ask your health care provider to check your cholesterol and your blood sugar (glucose). Continue to have your blood tested as directed by your health care provider. Do I need screening for cancer? Depending on your health history and family history, you may need to have cancer screenings at different stages of your life. This may include screening for: Breast cancer. Cervical cancer. Lung cancer. Colorectal cancer. What is my risk for osteoporosis? After menopause, you may be at increased risk for osteoporosis. Osteoporosis is a condition in which bone destruction happens more quickly than new bone creation. To help prevent osteoporosis or the bone fractures that can happen because of osteoporosis, you may take the following actions: If you are 66-72 years old, get at least 1,000 mg of calcium and at least 600 international units (IU) of vitamin D  per day. If you are older than age 45 but younger than age 27, get at least 1,200 mg of calcium and at least 600 international units (IU) of vitamin D  per day. If you are older than age 1, get at least 1,200 mg of calcium and at least 800 international units (IU) of vitamin D  per day. Smoking and drinking excessive alcohol increase the risk of osteoporosis. Eat foods that are rich in calcium and vitamin D , and do weight-bearing exercises several times each week as directed by your health care provider. How does menopause affect my mental health? Depression may occur at any age, but it is more common as you become older. Common symptoms of depression include: Feeling depressed. Changes in sleep patterns. Changes in appetite or eating patterns. Feeling an overall lack of motivation or enjoyment of activities that you previously enjoyed. Frequent crying spells. Talk with your health care provider if you think that you are experiencing any of these symptoms. General instructions See your health  care provider for regular wellness exams and vaccines. This may include: Scheduling regular health, dental, and eye exams. Getting and maintaining your vaccines. These include: Influenza vaccine. Get this vaccine each year before the flu season begins. Pneumonia vaccine. Shingles vaccine. Tetanus, diphtheria, and pertussis (Tdap) booster vaccine. Your health care provider may also recommend other immunizations. Tell your health care provider if you have ever been abused or do not feel safe at home. Summary Menopause is a normal process in which your ability to get pregnant comes to an end. This condition causes hot flashes, night sweats, decreased interest in sex, mood swings, headaches, or lack of sleep. Treatment for this condition may include hormone replacement therapy. Take actions to keep yourself healthy, including exercising regularly, eating a healthy diet, watching your weight, and checking your blood pressure and blood sugar levels. Get screened for cancer and depression. Make sure that you are up to date with all your vaccines. This information is not intended to replace advice given to you by your health care provider. Make sure you discuss any questions you have with your health care provider. Document Revised: 02/19/2021 Document Reviewed: 02/19/2021 Elsevier Patient Education  2024 [employer].

## 2024-09-28 NOTE — Assessment & Plan Note (Signed)
Chronic, stable. Continue losartan 75 mg 

## 2024-09-28 NOTE — Assessment & Plan Note (Signed)
 Clinical breast exam performed.  Right breast pain on exam.  Pending diagnostic images.  Encouraged more formal exercise, weight lifting.  Deferred pelvic exam the absence of complaints and she had had a hysterectomy.  She states she does not have a cervix.

## 2024-10-01 NOTE — Patient Outreach (Signed)
 Erroneous encounter

## 2024-10-03 ENCOUNTER — Ambulatory Visit: Payer: Self-pay | Admitting: Family

## 2024-10-03 DIAGNOSIS — R899 Unspecified abnormal finding in specimens from other organs, systems and tissues: Secondary | ICD-10-CM

## 2024-10-03 NOTE — Progress Notes (Unsigned)
 PROVIDER NOTE: Interpretation of information contained herein should be left to medically-trained personnel. Specific patient instructions are provided elsewhere under Patient Instructions section of medical record. This document was created in part using AI and STT-dictation technology, any transcriptional errors that may result from this process are unintentional.  Patient: Jacqueline Delgado  Service: E/M   PCP: Dineen Rollene MATSU, FNP  DOB: Apr 01, 1951  DOS: 10/04/2024  Provider: Eric DELENA Como, MD  MRN: [id number]  Delivery: Face-to-face  Specialty: Interventional Pain Management  Type: Established Patient  Setting: Ambulatory outpatient facility  Specialty designation: 09  Referring Prov.: Dineen Rollene MATSU, FNP  Location: Outpatient office facility       History of present illness (HPI) Jacqueline Delgado, a 73 y.o. year old female, is here today because of her Chronic pain of left knee [M25.562, G89.29]. Jacqueline Delgado primary complain today is No chief complaint on file.  Pertinent problems: Jacqueline Delgado has Localized swelling of finger of left hand; Acquired trigger finger; Closed nondisplaced fracture of base of fifth metacarpal bone of right hand; DDD (degenerative disc disease), cervical; DDD (degenerative disc disease), lumbar; Chronic low back pain (1ry area of Pain) (Bilateral) (R>L) w/ sciatica (Right); Neck pain; Non-cardiac chest pain; S/P rotator cuff repair; Unspecified osteoarthritis, unspecified site; Osteoarthritis of knee (Left); Ankle swelling, right; Chronic pain syndrome; DDD (degenerative disc disease), lumbosacral; Lumbar facet hypertrophy; Abnormal MRI, lumbar spine (08/11/20); Lumbosacral central spinal stenosis (9 mm) (L4-5 and L5-S1); Chronic lower extremity pain (Right); Lumbosacral radiculopathy at L5 (Right); Chronic knee pain (3ry area of Pain) (Left); Swelling of foot (4th area of Pain) (Right); Chronic lower extremity pain (2ry area of Pain) (Bilateral) (R>L);  Calcaneal spur of foot (Right); Lumbar Grade 1 Anterolisthesis of L4/L5; Lumbosacral facet syndrome (Bilateral) (R>L); Chronic low back pain (Bilateral) (R>L) w/o sciatica; Spondylosis without myelopathy or radiculopathy, lumbosacral region; S/P TKR (total knee replacement) using cement, left; Chronic knee pain after total replacement of knee joint (Left); Osteoarthritis of lumbar spine; Osteoarthritis involving multiple joints; Acute midline low back pain without sciatica; Chronic Delgado pain (Multifactorial) (Left); Abnormal MRI, cervical spine (07/25/2022); Cervicalgia; Cervical foraminal stenosis (Severe, Left: C3-4, C4-5) (Bilateral: C5-6, C6-7); Radicular pain of Delgado (Left); Cervical radiculopathy (Left) (C4, C5); Osteoarthritis of Delgado (Left); Osteoarthritis of AC (acromioclavicular) joint (Left); Arthralgia of acromioclavicular joint (Left); Upper arm pain (Left); Subacromial bursitis of Delgado joint (Left); Subdeltoid bursitis of Delgado joint (Left); Subscapularis tendinosis (Left); Supraspinatus tendinosis (Left); Lumbar facet joint pain; Complete tear of rotator cuff (Right); Generalized anxiety disorder; Neck mass; Headache; Low back pain of over 3 months duration; Intermittent low back pain; and Multifactorial low back pain on their pertinent problem list.  Pain Assessment: Severity of   is reported as a  /10. Location:    / . Onset:  . Quality:  . Timing:  . Modifying factor(s):  SABRA Vitals:  vitals were not taken for this visit.  BMI: Estimated body mass index is 28.24 kg/m as calculated from the following:   Height as of 09/27/24: 5' 3 (1.6 m).   Weight as of 09/27/24: 159 lb 6.4 oz (72.3 kg).  Last encounter: 08/30/2024. Last procedure: 09/07/2024.  Reason for encounter: post-procedure evaluation and assessment.   Discussed the use of AI scribe software for clinical note transcription with the patient, who gave verbal consent to proceed.  History of Present Illness           Post-Procedure Evaluation   Type: Genicular Nerves Block (Superolateral, Superomedial, and Inferomedial  Genicular Nerves)  #1  Laterality: Left (-LT)  Level: Superior and inferior to the knee joint.  Imaging: Fluoroscopic guidance Anesthesia: Local anesthesia (1-2% Lidocaine ) Anxiolysis: IV Versed   2.0 mg           Sedation: Minimal Sedation Fentanyl  1 mL (50 mcg) DOS: 09/07/2024  Performed by: Eric DELENA Como, MD  Purpose: Diagnostic/Therapeutic Indications: Chronic knee pain severe enough to impact quality of life or function. Rationale (medical necessity): procedure needed and proper for the diagnosis and/or treatment of Jacqueline Delgado medical symptoms and needs. 1. Chronic knee pain (3ry area of Pain) (Left)   2. Chronic knee pain after total replacement of knee joint (Left)   3. S/P TKR (total knee replacement) using cement, left   4. Osteoarthritis of knee (Left)    NAS-11 Pain score:   Pre-procedure: 4 /10   Post-procedure: 0-No pain/10     Effectiveness:  Initial hour after procedure:   ***. Subsequent 4-6 hours post-procedure:   ***. Analgesia past initial 6 hours:   ***. Ongoing improvement:  Analgesic:  *** Function:    ***    ROM:    ***    Interpretation: ***  Pharmacotherapy Assessment   Opioid Analgesic:  No chronic opioid analgesics therapy prescribed by our practice. Tramadol  50 mg tablet, 1 tab p.o. twice daily (PRN) MME/day: 0-10 mg/day   Monitoring: [place] PMP: PDMP reviewed during this encounter.       Pharmacotherapy: No side-effects or adverse reactions reported. Compliance: No problems identified. Effectiveness: Clinically acceptable.  No notes on file  UDS:  Summary  Date Value Ref Range Status  05/22/2020 Note  Final    Comment:    ==================================================================== Compliance Drug Analysis, Ur ==================================================================== Test                              Result       Flag       Units  Drug Present and Declared for Prescription Verification   Tramadol                        >3268        EXPECTED   ng/mg creat   O-Desmethyltramadol            2372         EXPECTED   ng/mg creat   N-Desmethyltramadol            1509         EXPECTED   ng/mg creat    Source of tramadol  is a prescription medication. O-desmethyltramadol    and N-desmethyltramadol are expected metabolites of tramadol .    Bupropion                       PRESENT      EXPECTED   Hydroxybupropion               PRESENT      EXPECTED    Hydroxybupropion is an expected metabolite of bupropion .  Drug Present not Declared for Prescription Verification   Dextromethorphan                PRESENT      UNEXPECTED   Dextrorphan/Levorphanol        PRESENT      UNEXPECTED    Dextrorphan is an expected metabolite of dextromethorphan , an over-    the-counter or prescription cough suppressant. Dextrorphan cannot  be    distinguished from the scheduled prescription medication levorphanol    by the method used for analysis.  ==================================================================== Test                      Result    Flag   Units      Ref Range   Creatinine              153              mg/dL      >=79 ==================================================================== Declared Medications:  The flagging and interpretation on this report are based on the  following declared medications.  Unexpected results may arise from  inaccuracies in the declared medications.   **Note: The testing scope of this panel includes these medications:   Bupropion  (Wellbutrin )  Tramadol  (Ultram )   **Note: The testing scope of this panel does not include the  following reported medications:   Famotidine  (Pepcid )  Hydrochlorothiazide  (Hydrodiuril )  Losartan  (Cozaar )  Meloxicam (Mobic)  Mirabegron  (Myrbetriq )  Rabeprazole   (Aciphex ) ==================================================================== For clinical consultation, please call [phone]. ====================================================================     No results found for: CBDTHCR No results found for: D8THCCBX No results found for: D9THCCBX  ROS  Constitutional: Denies any fever or chills Gastrointestinal: No reported hemesis, hematochezia, vomiting, or acute GI distress Musculoskeletal: Denies any acute onset joint swelling, redness, loss of ROM, or weakness Neurological: No reported episodes of acute onset apraxia, aphasia, dysarthria, agnosia, amnesia, paralysis, loss of coordination, or loss of consciousness  Medication Review  RABEprazole , famotidine , fluticasone , losartan , ondansetron , tirzepatide , and trospium   History Review  Allergy: Jacqueline Delgado has no known allergies. Drug: Jacqueline Delgado  reports no history of drug use. Alcohol:  reports no history of alcohol use. Tobacco:  reports that she has never smoked. She has never used smokeless tobacco. Social: Jacqueline Delgado  reports that she has never smoked. She has never used smokeless tobacco. She reports that she does not drink alcohol and does not use drugs. Medical:  has a past medical history of Anemia, Anginal pain, Arthritis, Chronic pain syndrome, Colon polyp, Depression, GERD (gastroesophageal reflux disease), Hiatal hernia, History of hiatal hernia, History of methicillin resistant staphylococcus aureus (MRSA), Hypertension, Obesity, Pre-diabetes, Reflux, Sleep apnea, and Small vessel disease. Surgical: Jacqueline Delgado  has a past surgical history that includes Joint replacement (Right); Joint replacement; Cholecystectomy; Abdominal hysterectomy; Delgado arthroscopy with rotator cuff repair and subacromial decompression (Right, 06/14/2015); Esophagogastroduodenoscopy (egd) with propofol  (N/A, 10/11/2015); Savory dilation (N/A, 10/11/2015); Colonoscopy w/ polypectomy;  Esophagogastroduodenoscopy (egd) with propofol  (N/A, 12/10/2017); Colonoscopy with propofol  (N/A, 12/10/2017); Total knee arthroplasty (Left, 03/06/2021); Joint replacement (Left); Esophagogastroduodenoscopy (egd) with propofol  (N/A, 05/03/2022); Breast surgery; Carpal tunnel release; Trigger finger release; Colonoscopy with propofol  (N/A, 04/25/2023); and polypectomy (04/25/2023). Family: family history includes Alcoholism in an other family member; Aneurysm (age of onset: 35) in her sister; Arthritis in an other family member; Breast cancer in her maternal grandmother; Breast cancer (age of onset: 98) in her mother; Colon cancer (age of onset: 40) in her sister; Diabetes in an other family member; Heart disease in her father; Hyperlipidemia in her father; Hypertension in her father; Stroke in her father.  Laboratory Chemistry Profile   Renal Lab Results  Component Value Date   BUN 16 09/27/2024   CREATININE 0.77 09/27/2024   BCR SEE NOTE: 07/30/2024   GFR 76.95 09/27/2024   GFRAA 81 09/13/2020   GFRNONAA 51 (L) 05/16/2024    Hepatic Lab Results  Component Value Date   AST 20 05/16/2024   ALT 10 05/16/2024   ALBUMIN 3.3 (L) 05/16/2024   ALKPHOS 82 05/16/2024   LIPASE 36 10/12/2017    Electrolytes Lab Results  Component Value Date   NA 141 09/27/2024   K 3.6 09/27/2024   CL 106 09/27/2024   CALCIUM 9.8 09/27/2024   MG 2.1 04/09/2023    Bone Lab Results  Component Value Date   VD25OH 21.08 (L) 01/29/2023   25OHVITD1 18 (L) 05/22/2020   25OHVITD2 13 05/22/2020   25OHVITD3 5.3 05/22/2020    Inflammation (CRP: Acute Phase) (ESR: Chronic Phase) Lab Results  Component Value Date   CRP 15 (H) 05/22/2020   ESRSEDRATE 73 (H) 05/22/2020         Note: Above Lab results reviewed.  Recent Imaging Review  DG PAIN CLINIC C-ARM 1-60 MIN NO REPORT Fluoro was used, but no Radiologist interpretation will be provided.  Please refer to NOTES tab for provider progress note. Note:  Reviewed        Physical Exam  Vitals: There were no vitals taken for this visit. BMI: Estimated body mass index is 28.24 kg/m as calculated from the following:   Height as of 09/27/24: 5' 3 (1.6 m).   Weight as of 09/27/24: 159 lb 6.4 oz (72.3 kg). Ideal: Ideal body weight: 52.4 kg (115 lb 8.3 oz) Adjusted ideal body weight: 60.4 kg (133 lb 1.2 oz) General appearance: Well nourished, well developed, and well hydrated. In no apparent acute distress Mental status: Alert, oriented x 3 (person, place, & time)       Respiratory: No evidence of acute respiratory distress Eyes: PERLA   Assessment   Diagnosis Status  1. Chronic knee pain (3ry area of Pain) (Left)   2. Chronic knee pain after total replacement of knee joint (Left)   3. Chronic low back pain (Bilateral) (R>L) w/o sciatica   4. Postop check    Controlled Controlled Controlled   Updated Problems: No problems updated.  Plan of Care  Problem-specific:  Assessment and Plan            Jacqueline Delgado has a current medication list which includes the following long-term medication(s): aciphex , fluticasone , and losartan .  Pharmacotherapy (Medications Ordered): No orders of the defined types were placed in this encounter.  Orders:  No orders of the defined types were placed in this encounter.    Interventional Therapies  Risk Factors  Considerations:  MO (BMI>30)  OSA  SOB  Cerebrovascular malformation  GERD  HTN     Planned  Pending:   Diagnostic left genicular NB #1    Under consideration:   Diagnostic left genicular NB #1  Therapeutic/palliative bilateral L-FCT RFA #2  Therapeutic left CESI #2    Completed:   Therapeutic left lumbar facet RFA x1 (07/15/2024) (100/100/35/75)   Therapeutic right lumbar facet RFA x1 (06/17/2024) (100/100/100/100)  Therapeutic/palliative bilateral lumbar facet MBB x5 (04/13/2024) (100/100/100 x1 week/0)  Diagnostic/therapeutic left IA glenohumeral + AC joint inj. +  subacromial bursa inj. x1 (01/09/2023) (100/100/70/70 x 4 months) Therapeutic left cervical ESI x1 (11/26/2022) (75/75/50/50) (Neck: 100  (L) Delgado: 50)  Therapeutic/palliative bilateral lumbar facet MBB x4 (06/19/2023) (100/100/100/100 x 6 months)  Therapeutic right L4-5 LESI x1 (07/27/2020)  Referral to bariatric surgery and medical weight management entered on 07/18/2020.    Completed by other providers:   EEG x2 by Dr. Jannett Fairly (07/03/14, 07/29/14) - WNL  Diagnostic left occipital nerve block x1 (  08/10/2014) by Dr. Jannett Fairly [facility] neurology)  Therapeutic right rotator cuff repair x1 (06/14/2015) by HILARIO WENDI Glenn MD [facility] orthopedics)  Therapeutic left IA Synvisc knee injection x2 (10/02/2020) by Morene Redell Sharps, PA [facility] orthopedics and PMR) Therapeutic left total knee replacement x1 (03/06/2021) by Dr. Ozell Flake [facility] orthopedics)   Therapeutic  Palliative (PRN) options:   Palliative bilateral lumbar facet MBB #4  Therapeutic right L4-5 LESI #2      No follow-ups on file.    Recent Visits Date Type Provider Dept  09/07/24 Procedure visit Tanya Glisson, MD [facility]  08/30/24 Office Visit Tanya Glisson, MD [facility]  07/15/24 Procedure visit Tanya Glisson, MD [facility]  Showing recent visits within past 90 days and meeting all other requirements Future Appointments Date Type Provider Dept  10/04/24 Appointment Tanya Glisson, MD [facility]  Showing future appointments within next 90 days and meeting all other requirements  I discussed the assessment and treatment plan with the patient. The patient was provided an opportunity to ask questions and all were answered. The patient agreed with the plan and demonstrated an understanding of the instructions.  Patient advised to call back or seek an in-person evaluation if the symptoms or condition worsens.  Duration of encounter: *** minutes.  Total time on  encounter, as per AMA guidelines included both the face-to-face and non-face-to-face time personally spent by the physician and/or other qualified health care professional(s) on the day of the encounter (includes time in activities that require the physician or other qualified health care professional and does not include time in activities normally performed by clinical staff). Physician's time may include the following activities when performed: Preparing to see the patient (e.g., pre-charting review of records, searching for previously ordered imaging, lab work, and nerve conduction tests) Review of prior analgesic pharmacotherapies. Reviewing PMP Interpreting ordered tests (e.g., lab work, imaging, nerve conduction tests) Performing post-procedure evaluations, including interpretation of diagnostic procedures Obtaining and/or reviewing separately obtained history Performing a medically appropriate examination and/or evaluation Counseling and educating the patient/family/caregiver Ordering medications, tests, or procedures Referring and communicating with other health care professionals (when not separately reported) Documenting clinical information in the electronic or other health record Independently interpreting results (not separately reported) and communicating results to the patient/ family/caregiver Care coordination (not separately reported)  Note by: Glisson DELENA Tanya, MD (TTS and AI technology used. I apologize for any typographical errors that were not detected and corrected.) Date: 10/04/2024; Time: 4:22 PM

## 2024-10-04 ENCOUNTER — Ambulatory Visit (HOSPITAL_BASED_OUTPATIENT_CLINIC_OR_DEPARTMENT_OTHER): Admitting: Pain Medicine

## 2024-10-04 DIAGNOSIS — Z91199 Patient's noncompliance with other medical treatment and regimen due to unspecified reason: Secondary | ICD-10-CM

## 2024-10-04 DIAGNOSIS — Z09 Encounter for follow-up examination after completed treatment for conditions other than malignant neoplasm: Secondary | ICD-10-CM

## 2024-10-04 DIAGNOSIS — G8929 Other chronic pain: Secondary | ICD-10-CM

## 2024-10-12 ENCOUNTER — Other Ambulatory Visit: Payer: Self-pay | Admitting: Family

## 2024-10-13 ENCOUNTER — Inpatient Hospital Stay: Admission: RE | Admit: 2024-10-13 | Source: Ambulatory Visit

## 2024-10-13 ENCOUNTER — Other Ambulatory Visit: Payer: Self-pay

## 2024-10-13 DIAGNOSIS — R300 Dysuria: Secondary | ICD-10-CM

## 2024-10-18 ENCOUNTER — Other Ambulatory Visit

## 2024-10-18 DIAGNOSIS — R300 Dysuria: Secondary | ICD-10-CM | POA: Diagnosis not present

## 2024-10-18 DIAGNOSIS — R899 Unspecified abnormal finding in specimens from other organs, systems and tissues: Secondary | ICD-10-CM

## 2024-10-18 LAB — COMPREHENSIVE METABOLIC PANEL WITH GFR
ALT: 6 U/L (ref 3–35)
AST: 10 U/L (ref 5–37)
Albumin: 3.6 g/dL (ref 3.5–5.2)
Alkaline Phosphatase: 72 U/L (ref 39–117)
BUN: 16 mg/dL (ref 6–23)
CO2: 28 meq/L (ref 19–32)
Calcium: 9 mg/dL (ref 8.4–10.5)
Chloride: 109 meq/L (ref 96–112)
Creatinine, Ser: 0.62 mg/dL (ref 0.40–1.20)
GFR: 88.8 mL/min
Glucose, Bld: 71 mg/dL (ref 70–99)
Potassium: 3.7 meq/L (ref 3.5–5.1)
Sodium: 143 meq/L (ref 135–145)
Total Bilirubin: 0.6 mg/dL (ref 0.2–1.2)
Total Protein: 6.1 g/dL (ref 6.0–8.3)

## 2024-10-18 LAB — LIPID PANEL
Cholesterol: 150 mg/dL (ref 28–200)
HDL: 39.2 mg/dL
LDL Cholesterol: 100 mg/dL — ABNORMAL HIGH (ref 10–99)
NonHDL: 110.54
Total CHOL/HDL Ratio: 4
Triglycerides: 52 mg/dL (ref 10.0–149.0)
VLDL: 10.4 mg/dL (ref 0.0–40.0)

## 2024-10-18 LAB — URINALYSIS, ROUTINE W REFLEX MICROSCOPIC
Hgb urine dipstick: NEGATIVE
Ketones, ur: NEGATIVE
Nitrite: NEGATIVE
RBC / HPF: NONE SEEN
Specific Gravity, Urine: 1.025 (ref 1.000–1.030)
Total Protein, Urine: NEGATIVE
Urine Glucose: NEGATIVE
Urobilinogen, UA: 1 (ref 0.0–1.0)
pH: 6 (ref 5.0–8.0)

## 2024-10-20 ENCOUNTER — Other Ambulatory Visit: Payer: Self-pay

## 2024-10-20 ENCOUNTER — Encounter: Payer: Self-pay | Admitting: *Deleted

## 2024-10-20 ENCOUNTER — Other Ambulatory Visit: Payer: Self-pay | Admitting: *Deleted

## 2024-10-21 ENCOUNTER — Ambulatory Visit: Payer: Self-pay | Admitting: Family

## 2024-10-21 DIAGNOSIS — N3000 Acute cystitis without hematuria: Secondary | ICD-10-CM

## 2024-10-21 LAB — URINE CULTURE
MICRO NUMBER:: 17425791
SPECIMEN QUALITY:: ADEQUATE

## 2024-10-21 MED ORDER — AMOXICILLIN-POT CLAVULANATE 875-125 MG PO TABS: 1.0000 | ORAL_TABLET | Freq: Two times a day (BID) | ORAL | 0 refills | Status: AC

## 2024-10-23 ENCOUNTER — Other Ambulatory Visit: Payer: Self-pay | Admitting: Family

## 2024-10-27 NOTE — Telephone Encounter (Signed)
 Call patient I received a refill request for trospium  which is a medication use for overactive bladder.  I am hesitant to refill this as last she was seen to have a urinary tract infection.  I wanted to recheck urine.  Please ensure that she is scheduled for repeat urinalysis, urine culture.  Please order.  I have refilled trospium  however advised her not to take this medication if she is having any pain with urination fever or chills, symptoms otherwise to suggest an infection

## 2024-10-29 ENCOUNTER — Telehealth: Payer: Self-pay | Admitting: *Deleted

## 2024-10-29 NOTE — Telephone Encounter (Signed)
 Spoke to pt she does take the Trospium  but  not daily she scheduled appt for 11/15/24 for ov and repeat urine

## 2024-10-29 NOTE — Telephone Encounter (Signed)
 Copied from CRM [id number]. Topic: Clinical - Medication Question >> Oct 29, 2024  2:06 PM Macario HERO wrote: Reason for CRM: Patient called requesting a call from nurse regarding medication side effects. Patient is not sure of the medication name.

## 2024-10-29 NOTE — Telephone Encounter (Signed)
 Spoke to pt was not the medication affecting her

## 2024-11-09 ENCOUNTER — Telehealth: Payer: Self-pay | Admitting: Family

## 2024-11-09 NOTE — Telephone Encounter (Signed)
 Copied from CRM [id number]. Topic: Referral - Question >> Nov 09, 2024 10:39 AM Corin V wrote: Reason for CRM: [facility] calling requesting that we fax over notes and labs supporting the hypokalemia diagnosis. The December note sent did not mention this diagnosis. Fax:  [phone]

## 2024-11-09 NOTE — Telephone Encounter (Signed)
 I faxed over the documents endocrin. Requested, thank you

## 2024-11-15 ENCOUNTER — Ambulatory Visit: Admitting: Family

## 2024-11-15 ENCOUNTER — Telehealth: Payer: Self-pay

## 2024-11-15 NOTE — Progress Notes (Signed)
 Complex Care Management Care Guide Note  11/15/2024 Name: ZELLIE JENNING MRN: [id number] DOB: June 22, 1951  Devere JAYSON Shoulder is a 74 y.o. year old female who is a primary care patient of Dineen Rollene MATSU, FNP and is actively engaged with the care management team. I reached out to Devere JAYSON Shoulder by phone today to assist with re-scheduling  with the RN Case Manager.  Follow up plan: Telephone appointment with complex care management team member scheduled for:  11/25/24 @ 9 AM.  Leotis Rase [facility], [facility]  Direct Dial: [phone]  Fax [phone]

## 2024-11-22 ENCOUNTER — Telehealth: Admitting: *Deleted

## 2024-11-25 ENCOUNTER — Telehealth

## 2024-12-02 ENCOUNTER — Ambulatory Visit: Admitting: Family

## 2025-03-14 ENCOUNTER — Ambulatory Visit
